# Patient Record
Sex: Male | Born: 1950
Health system: Southern US, Community
[De-identification: ages and names within clinical notes are randomized; demographics above are authoritative.]

## PROBLEM LIST (undated history)

## (undated) DIAGNOSIS — E559 Vitamin D deficiency, unspecified: Secondary | ICD-10-CM

## (undated) DIAGNOSIS — K811 Chronic cholecystitis: Secondary | ICD-10-CM

## (undated) DIAGNOSIS — I509 Heart failure, unspecified: Secondary | ICD-10-CM

## (undated) DIAGNOSIS — I10 Essential (primary) hypertension: Secondary | ICD-10-CM

## (undated) DIAGNOSIS — E119 Type 2 diabetes mellitus without complications: Secondary | ICD-10-CM

## (undated) DIAGNOSIS — N189 Chronic kidney disease, unspecified: Secondary | ICD-10-CM

## (undated) DIAGNOSIS — M069 Rheumatoid arthritis, unspecified: Secondary | ICD-10-CM

## (undated) HISTORY — PX: HERNIA REPAIR: SHX51

## (undated) HISTORY — DX: Heart failure, unspecified: I50.9

## (undated) HISTORY — PX: COLONOSCOPY: SHX174

## (undated) HISTORY — PX: OTHER SURGICAL HISTORY: SHX169

---

## 1996-09-02 DIAGNOSIS — M069 Rheumatoid arthritis, unspecified: Secondary | ICD-10-CM | POA: Insufficient documentation

## 2008-09-02 DIAGNOSIS — I10 Essential (primary) hypertension: Secondary | ICD-10-CM | POA: Insufficient documentation

## 2011-05-15 ENCOUNTER — Other Ambulatory Visit (HOSPITAL_COMMUNITY): Payer: Self-pay | Admitting: Family Medicine

## 2011-05-15 DIAGNOSIS — R1011 Right upper quadrant pain: Secondary | ICD-10-CM

## 2011-05-17 ENCOUNTER — Ambulatory Visit (HOSPITAL_COMMUNITY)
Admission: RE | Admit: 2011-05-17 | Discharge: 2011-05-17 | Disposition: A | Discharge status: Home / Self Care | Payer: 59 | Source: Ambulatory Visit | Attending: Family Medicine | Admitting: Family Medicine

## 2011-05-17 DIAGNOSIS — E119 Type 2 diabetes mellitus without complications: Secondary | ICD-10-CM | POA: Insufficient documentation

## 2011-05-17 DIAGNOSIS — R1011 Right upper quadrant pain: Secondary | ICD-10-CM | POA: Insufficient documentation

## 2011-05-17 DIAGNOSIS — R9389 Abnormal findings on diagnostic imaging of other specified body structures: Secondary | ICD-10-CM | POA: Insufficient documentation

## 2011-05-17 DIAGNOSIS — N281 Cyst of kidney, acquired: Secondary | ICD-10-CM | POA: Insufficient documentation

## 2011-05-17 DIAGNOSIS — I10 Essential (primary) hypertension: Secondary | ICD-10-CM | POA: Insufficient documentation

## 2011-05-20 ENCOUNTER — Other Ambulatory Visit (HOSPITAL_COMMUNITY): Payer: Self-pay | Admitting: Family Medicine

## 2011-05-20 DIAGNOSIS — IMO0002 Reserved for concepts with insufficient information to code with codable children: Secondary | ICD-10-CM

## 2011-05-21 ENCOUNTER — Ambulatory Visit (HOSPITAL_COMMUNITY)
Admission: RE | Admit: 2011-05-21 | Discharge: 2011-05-21 | Disposition: A | Discharge status: Home / Self Care | Payer: 59 | Source: Ambulatory Visit | Attending: Family Medicine | Admitting: Family Medicine

## 2011-05-21 DIAGNOSIS — Q619 Cystic kidney disease, unspecified: Secondary | ICD-10-CM | POA: Insufficient documentation

## 2011-05-21 DIAGNOSIS — IMO0002 Reserved for concepts with insufficient information to code with codable children: Secondary | ICD-10-CM

## 2011-05-21 DIAGNOSIS — N200 Calculus of kidney: Secondary | ICD-10-CM | POA: Insufficient documentation

## 2011-05-21 DIAGNOSIS — K8020 Calculus of gallbladder without cholecystitis without obstruction: Secondary | ICD-10-CM | POA: Insufficient documentation

## 2011-05-21 DIAGNOSIS — K5730 Diverticulosis of large intestine without perforation or abscess without bleeding: Secondary | ICD-10-CM | POA: Insufficient documentation

## 2011-05-21 DIAGNOSIS — R9389 Abnormal findings on diagnostic imaging of other specified body structures: Secondary | ICD-10-CM | POA: Insufficient documentation

## 2011-05-21 MED ORDER — IOHEXOL 350 MG/ML SOLN
100.0000 mL | Freq: Once | INTRAVENOUS | Status: AC | PRN
  Administered 2011-05-21: 100 mL via INTRAVENOUS

## 2011-11-20 ENCOUNTER — Other Ambulatory Visit (HOSPITAL_COMMUNITY): Payer: Self-pay | Admitting: *Deleted

## 2011-11-25 ENCOUNTER — Encounter (HOSPITAL_COMMUNITY)
Admission: RE | Admit: 2011-11-25 | Discharge: 2011-11-25 | Disposition: A | Discharge status: Home / Self Care | Payer: 59 | Source: Ambulatory Visit | Attending: Internal Medicine | Admitting: Internal Medicine

## 2011-11-25 DIAGNOSIS — M069 Rheumatoid arthritis, unspecified: Secondary | ICD-10-CM | POA: Insufficient documentation

## 2011-11-25 MED ORDER — SODIUM CHLORIDE 0.9 % IV SOLN
1000.0000 mg | INTRAVENOUS | Status: DC
  Administered 2011-11-25: 1000 mg via INTRAVENOUS
  Filled 2011-11-25: qty 40

## 2011-11-25 MED ORDER — SODIUM CHLORIDE 0.9 % IV SOLN
INTRAVENOUS | Status: DC
  Administered 2011-11-25: 12:00:00 via INTRAVENOUS

## 2011-12-23 ENCOUNTER — Inpatient Hospital Stay (HOSPITAL_COMMUNITY): Admission: RE | Admit: 2011-12-23 | Payer: 59 | Source: Ambulatory Visit

## 2012-01-16 ENCOUNTER — Encounter (HOSPITAL_COMMUNITY)
Admission: RE | Admit: 2012-01-16 | Discharge: 2012-01-16 | Disposition: A | Discharge status: Home / Self Care | Payer: 59 | Source: Ambulatory Visit | Attending: Internal Medicine | Admitting: Internal Medicine

## 2012-01-16 DIAGNOSIS — M069 Rheumatoid arthritis, unspecified: Secondary | ICD-10-CM | POA: Insufficient documentation

## 2012-01-16 MED ORDER — SODIUM CHLORIDE 0.9 % IV SOLN
INTRAVENOUS | Status: DC
  Administered 2012-01-16: 11:00:00 via INTRAVENOUS

## 2012-01-16 MED ORDER — ABATACEPT 250 MG IV SOLR
1000.0000 mg | INTRAVENOUS | Status: DC
  Administered 2012-01-16: 1000 mg via INTRAVENOUS
  Filled 2012-01-16: qty 40

## 2012-02-07 ENCOUNTER — Ambulatory Visit (INDEPENDENT_AMBULATORY_CARE_PROVIDER_SITE_OTHER): Payer: 59 | Admitting: Pharmacist

## 2012-02-07 ENCOUNTER — Encounter: Payer: Self-pay | Admitting: Pharmacist

## 2012-02-07 VITALS — BP 123/81 | HR 85 | Ht 73.5 in | Wt 212.9 lb

## 2012-02-07 DIAGNOSIS — E119 Type 2 diabetes mellitus without complications: Secondary | ICD-10-CM

## 2012-02-07 DIAGNOSIS — I10 Essential (primary) hypertension: Secondary | ICD-10-CM

## 2012-02-07 DIAGNOSIS — M069 Rheumatoid arthritis, unspecified: Secondary | ICD-10-CM

## 2012-02-07 NOTE — Progress Notes (Signed)
  Subjective:    Patient ID: Gary Lang, male    DOB: 1951/07/17, 61 y.o.   MRN: [id number]  HPI Patient arrives in good spirits for medication review.  Reports seeing Dr. Armanda Heritage as primary care provider at [facility] and  Dr. Jolee Ewing as his rheumatologist. Reports being diagnosed with Rheumatoid Arthritis for 15 years and states he is under acceptable level of control with Orencia for 7 years.     Review of Systems     Objective:   Physical Exam        Assessment & Plan:  Following medication review, no suggestions for change.  Complete medication list provided to patient.  Total time in face to face medication review: 20 minutes.  Patient seen with: Francesca Jewett, PharmD, Pharmacy Resident.

## 2012-02-07 NOTE — Progress Notes (Signed)
Patient ID: Gary Lang, male   DOB: Jul 01, 1951, 61 y.o.   MRN: [id number] Reviewed and agree with Dr. Graylin Shiver management.

## 2012-02-07 NOTE — Assessment & Plan Note (Signed)
Following medication review, no suggestions for change.  Complete medication list provided to patient.  Total time in face to face medication review: 20 minutes.  Patient seen with: Francesca Jewett, PharmD, Pharmacy Resident.

## 2012-02-12 ENCOUNTER — Encounter (HOSPITAL_COMMUNITY): Payer: 59

## 2013-06-10 ENCOUNTER — Ambulatory Visit (HOSPITAL_COMMUNITY)
Admission: RE | Admit: 2013-06-10 | Discharge: 2013-06-10 | Disposition: A | Discharge status: Home / Self Care | Payer: 59 | Source: Ambulatory Visit | Attending: Family Medicine | Admitting: Family Medicine

## 2013-06-10 ENCOUNTER — Other Ambulatory Visit (HOSPITAL_COMMUNITY): Payer: Self-pay | Admitting: Family Medicine

## 2013-06-10 DIAGNOSIS — N19 Unspecified kidney failure: Secondary | ICD-10-CM | POA: Insufficient documentation

## 2013-06-10 DIAGNOSIS — N281 Cyst of kidney, acquired: Secondary | ICD-10-CM | POA: Insufficient documentation

## 2013-06-18 ENCOUNTER — Encounter (HOSPITAL_COMMUNITY): Payer: Self-pay | Admitting: Emergency Medicine

## 2013-06-18 ENCOUNTER — Inpatient Hospital Stay (HOSPITAL_COMMUNITY)
Admission: EM | Admit: 2013-06-18 | Discharge: 2013-07-01 | DRG: 674 | Disposition: A | Discharge status: Home / Self Care | Payer: 59 | Attending: Family Medicine | Admitting: Family Medicine

## 2013-06-18 DIAGNOSIS — E876 Hypokalemia: Secondary | ICD-10-CM | POA: Diagnosis not present

## 2013-06-18 DIAGNOSIS — N170 Acute kidney failure with tubular necrosis: Secondary | ICD-10-CM | POA: Diagnosis present

## 2013-06-18 DIAGNOSIS — E785 Hyperlipidemia, unspecified: Secondary | ICD-10-CM

## 2013-06-18 DIAGNOSIS — N051 Unspecified nephritic syndrome with focal and segmental glomerular lesions: Secondary | ICD-10-CM

## 2013-06-18 DIAGNOSIS — N042 Nephrotic syndrome with diffuse membranous glomerulonephritis, unspecified: Secondary | ICD-10-CM | POA: Diagnosis present

## 2013-06-18 DIAGNOSIS — Z87891 Personal history of nicotine dependence: Secondary | ICD-10-CM

## 2013-06-18 DIAGNOSIS — Z7982 Long term (current) use of aspirin: Secondary | ICD-10-CM

## 2013-06-18 DIAGNOSIS — E871 Hypo-osmolality and hyponatremia: Secondary | ICD-10-CM | POA: Diagnosis not present

## 2013-06-18 DIAGNOSIS — N186 End stage renal disease: Secondary | ICD-10-CM | POA: Diagnosis present

## 2013-06-18 DIAGNOSIS — N049 Nephrotic syndrome with unspecified morphologic changes: Secondary | ICD-10-CM

## 2013-06-18 DIAGNOSIS — E872 Acidosis, unspecified: Secondary | ICD-10-CM | POA: Diagnosis not present

## 2013-06-18 DIAGNOSIS — N19 Unspecified kidney failure: Secondary | ICD-10-CM

## 2013-06-18 DIAGNOSIS — Z23 Encounter for immunization: Secondary | ICD-10-CM

## 2013-06-18 DIAGNOSIS — E119 Type 2 diabetes mellitus without complications: Secondary | ICD-10-CM

## 2013-06-18 DIAGNOSIS — E8809 Other disorders of plasma-protein metabolism, not elsewhere classified: Secondary | ICD-10-CM | POA: Diagnosis present

## 2013-06-18 DIAGNOSIS — M069 Rheumatoid arthritis, unspecified: Secondary | ICD-10-CM

## 2013-06-18 DIAGNOSIS — E8779 Other fluid overload: Secondary | ICD-10-CM | POA: Diagnosis present

## 2013-06-18 DIAGNOSIS — I120 Hypertensive chronic kidney disease with stage 5 chronic kidney disease or end stage renal disease: Secondary | ICD-10-CM | POA: Diagnosis present

## 2013-06-18 DIAGNOSIS — I10 Essential (primary) hypertension: Secondary | ICD-10-CM

## 2013-06-18 DIAGNOSIS — T502X5A Adverse effect of carbonic-anhydrase inhibitors, benzothiadiazides and other diuretics, initial encounter: Secondary | ICD-10-CM | POA: Diagnosis present

## 2013-06-18 DIAGNOSIS — N179 Acute kidney failure, unspecified: Principal | ICD-10-CM | POA: Diagnosis present

## 2013-06-18 DIAGNOSIS — D638 Anemia in other chronic diseases classified elsewhere: Secondary | ICD-10-CM | POA: Diagnosis present

## 2013-06-18 HISTORY — DX: Type 2 diabetes mellitus without complications: E11.9

## 2013-06-18 HISTORY — DX: Rheumatoid arthritis, unspecified: M06.9

## 2013-06-18 HISTORY — DX: Essential (primary) hypertension: I10

## 2013-06-18 LAB — CBC WITH DIFFERENTIAL/PLATELET
Basophils Absolute: 0 10*3/uL (ref 0.0–0.1)
Basophils Relative: 0 % (ref 0–1)
Eosinophils Absolute: 0.2 10*3/uL (ref 0.0–0.7)
Eosinophils Relative: 3 % (ref 0–5)
HCT: 31.5 % — ABNORMAL LOW (ref 39.0–52.0)
Hemoglobin: 11.2 g/dL — ABNORMAL LOW (ref 13.0–17.0)
Lymphocytes Relative: 25 % (ref 12–46)
Lymphs Abs: 1.6 10*3/uL (ref 0.7–4.0)
MCH: 28.4 pg (ref 26.0–34.0)
MCHC: 35.6 g/dL (ref 30.0–36.0)
MCV: 79.9 fL (ref 78.0–100.0)
Monocytes Absolute: 0.4 10*3/uL (ref 0.1–1.0)
Monocytes Relative: 6 % (ref 3–12)
Neutro Abs: 4.1 10*3/uL (ref 1.7–7.7)
Neutrophils Relative %: 65 % (ref 43–77)
Platelets: 270 10*3/uL (ref 150–400)
RBC: 3.94 MIL/uL — ABNORMAL LOW (ref 4.22–5.81)
RDW: 12.7 % (ref 11.5–15.5)
WBC: 6.3 10*3/uL (ref 4.0–10.5)

## 2013-06-18 LAB — CBC
HCT: 31.5 % — ABNORMAL LOW (ref 39.0–52.0)
Hemoglobin: 11.2 g/dL — ABNORMAL LOW (ref 13.0–17.0)
MCH: 28.3 pg (ref 26.0–34.0)
MCHC: 35.6 g/dL (ref 30.0–36.0)
MCV: 79.5 fL (ref 78.0–100.0)
Platelets: 270 10*3/uL (ref 150–400)
RBC: 3.96 MIL/uL — ABNORMAL LOW (ref 4.22–5.81)
RDW: 12.7 % (ref 11.5–15.5)
WBC: 6.8 10*3/uL (ref 4.0–10.5)

## 2013-06-18 LAB — URINE MICROSCOPIC-ADD ON

## 2013-06-18 LAB — COMPREHENSIVE METABOLIC PANEL
ALT: 10 U/L (ref 0–53)
AST: 12 U/L (ref 0–37)
Albumin: 1.3 g/dL — ABNORMAL LOW (ref 3.5–5.2)
Alkaline Phosphatase: 79 U/L (ref 39–117)
BUN: 50 mg/dL — ABNORMAL HIGH (ref 6–23)
CO2: 21 mEq/L (ref 19–32)
Calcium: 7.5 mg/dL — ABNORMAL LOW (ref 8.4–10.5)
Chloride: 108 mEq/L (ref 96–112)
Creatinine, Ser: 6.49 mg/dL — ABNORMAL HIGH (ref 0.50–1.35)
GFR calc Af Amer: 9 mL/min — ABNORMAL LOW (ref >90)
GFR calc non Af Amer: 8 mL/min — ABNORMAL LOW (ref >90)
Glucose, Bld: 125 mg/dL — ABNORMAL HIGH (ref 70–99)
Potassium: 3.3 mEq/L — ABNORMAL LOW (ref 3.5–5.1)
Sodium: 140 mEq/L (ref 135–145)
Total Bilirubin: <0.1 mg/dL — ABNORMAL LOW (ref 0.3–1.2)
Total Protein: 5.5 g/dL — ABNORMAL LOW (ref 6.0–8.3)

## 2013-06-18 LAB — URINALYSIS, ROUTINE W REFLEX MICROSCOPIC
Bilirubin Urine: NEGATIVE
Glucose, UA: 250 mg/dL — AB
Ketones, ur: NEGATIVE mg/dL
Leukocytes, UA: NEGATIVE
Nitrite: NEGATIVE
Protein, ur: >300 mg/dL — AB
Specific Gravity, Urine: 1.022 (ref 1.005–1.030)
Urobilinogen, UA: 0.2 mg/dL (ref 0.0–1.0)
pH: 5.5 (ref 5.0–8.0)

## 2013-06-18 LAB — CREATININE, SERUM
Creatinine, Ser: 6.26 mg/dL — ABNORMAL HIGH (ref 0.50–1.35)
GFR calc Af Amer: 10 mL/min — ABNORMAL LOW (ref >90)
GFR calc non Af Amer: 9 mL/min — ABNORMAL LOW (ref >90)

## 2013-06-18 LAB — GLUCOSE, CAPILLARY: Glucose-Capillary: 86 mg/dL (ref 70–99)

## 2013-06-18 MED ORDER — ONDANSETRON HCL 4 MG PO TABS: 4.0000 mg | ORAL_TABLET | Freq: Four times a day (QID) | ORAL | Status: DC | PRN

## 2013-06-18 MED ORDER — INFLUENZA VAC SPLIT QUAD 0.5 ML IM SUSP
0.5000 mL | INTRAMUSCULAR | Status: AC
  Administered 2013-06-19: 0.5 mL via INTRAMUSCULAR
  Filled 2013-06-18: qty 0.5

## 2013-06-18 MED ORDER — SODIUM CHLORIDE 0.9 % IJ SOLN: 3.0000 mL | INTRAMUSCULAR | Status: DC | PRN

## 2013-06-18 MED ORDER — ONDANSETRON HCL 4 MG/2ML IJ SOLN: 4.0000 mg | Freq: Four times a day (QID) | INTRAMUSCULAR | Status: DC | PRN

## 2013-06-18 MED ORDER — HYDROCHLOROTHIAZIDE 12.5 MG PO CAPS
12.5000 mg | ORAL_CAPSULE | Freq: Every day | ORAL | Status: DC
  Administered 2013-06-19 – 2013-06-23 (×5): 12.5 mg via ORAL
  Filled 2013-06-18 (×5): qty 1

## 2013-06-18 MED ORDER — PNEUMOCOCCAL 13-VAL CONJ VACC IM SUSP
0.5000 mL | INTRAMUSCULAR | Status: DC
  Filled 2013-06-18: qty 0.5

## 2013-06-18 MED ORDER — HEPARIN SODIUM (PORCINE) 5000 UNIT/ML IJ SOLN
5000.0000 [IU] | Freq: Three times a day (TID) | INTRAMUSCULAR | Status: DC
  Administered 2013-06-18 – 2013-06-20 (×5): 5000 [IU] via SUBCUTANEOUS
  Filled 2013-06-18 (×8): qty 1

## 2013-06-18 MED ORDER — ACETAMINOPHEN 500 MG PO TABS
1000.0000 mg | ORAL_TABLET | Freq: Three times a day (TID) | ORAL | Status: DC | PRN
  Filled 2013-06-18: qty 2

## 2013-06-18 MED ORDER — INSULIN ASPART 100 UNIT/ML ~~LOC~~ SOLN
0.0000 [IU] | Freq: Three times a day (TID) | SUBCUTANEOUS | Status: DC
  Administered 2013-06-19 (×2): 2 [IU] via SUBCUTANEOUS
  Administered 2013-06-19: 1 [IU] via SUBCUTANEOUS
  Administered 2013-06-20: 5 [IU] via SUBCUTANEOUS
  Administered 2013-06-20: 7 [IU] via SUBCUTANEOUS
  Administered 2013-06-20: 3 [IU] via SUBCUTANEOUS

## 2013-06-18 MED ORDER — POTASSIUM CHLORIDE CRYS ER 20 MEQ PO TBCR
40.0000 meq | EXTENDED_RELEASE_TABLET | Freq: Once | ORAL | Status: AC
  Administered 2013-06-18: 40 meq via ORAL
  Filled 2013-06-18: qty 2

## 2013-06-18 MED ORDER — SODIUM CHLORIDE 0.9 % IJ SOLN
3.0000 mL | Freq: Two times a day (BID) | INTRAMUSCULAR | Status: DC
  Administered 2013-06-18 – 2013-06-30 (×22): 3 mL via INTRAVENOUS

## 2013-06-18 MED ORDER — SODIUM CHLORIDE 0.9 % IV SOLN: 250.0000 mL | INTRAVENOUS | Status: DC | PRN

## 2013-06-18 MED ORDER — FUROSEMIDE 10 MG/ML IJ SOLN
80.0000 mg | Freq: Three times a day (TID) | INTRAMUSCULAR | Status: DC
  Administered 2013-06-18 – 2013-06-24 (×17): 80 mg via INTRAVENOUS
  Filled 2013-06-18 (×20): qty 8

## 2013-06-18 NOTE — ED Notes (Signed)
Pt reports that he has noticed swelling in his left arm and bilateral legs for the past couple of days. Reports that he was recently placed on lasix but feels like the medication is not helping. Denies any previous problems with kidneys or cardiac hx. Denies any pain, skin warm and dry. Reports he was told to come over by a kidney MD.

## 2013-06-18 NOTE — Progress Notes (Signed)
Unit CM UR Completed by [facility] ED CM  W. Oni Dietzman RN  

## 2013-06-18 NOTE — ED Notes (Signed)
Pt admitted to 6700 room 24, report given to [place], pt transported by [place] EMT.

## 2013-06-18 NOTE — H&P (Signed)
[facility] Admission History and Physical Service Pager: [phone]  Patient name: Gary Lang Medical record number: [id number] Date of birth: 1951/02/06 Age: 62 y.o. Gender: male  Primary Care Provider: Tawni Carnes, PA-C Consultants: Renal Code Status: Full  Chief Complaint: arm and leg swelling  Assessment and Plan: Zaccari Canuto is a 62 y.o. male presenting with UE and LE swelling and AKI. PMH is significant for rheumatoid arthritis, HTN, DM, and distant tobacco use   # Renal: AKI w/ Cr of 6.49 w/ no known h/o renal impairment. Highly suspiscious of nephrotic syndrome given swelling that is not responsive to diuresis, low albumin, and gross protein in urine. Unclear on cause of current syndrome, but may be related to abatacept as well as chronic changes from HTN and DM. Spoke to Dr. Mercy Moore concerning case and appreciate his input. Renal cyst noted on [place] is unchanged from previous scan 2 years ago. - Admit to med surge - Consult Renal. - 24 hr urine protein - lipid panel in am - Lasix 80 TID IV - SPEP, UPEP - Hold Abatacept, Metformin and lisinopril - Strict I/O  # Rheumatoid arthritis: no recent flares and excellent control on abatacept. Some concern that this may be conributing to current condition - hold abatacept 125mg  IM Qwkly  # DM: h/o DM that is controlled w/ metformin alone. No injectible per pt.  - Hold home metformin due to potential metabolic acidosis given current renal state - CBG monitoring - A1c - SSI  # CV: h/o HTN and hypertensive on admission. Holding lisinopril due to renal impairment. Has not taken his medications for several days per his primary care physician's orders - Cont home HCTZ and hold lisinopril  FEN/GI: Tolerating PO. HypoK likely from diuresis - Renal diet - Kdur 31mEq x1  Prophylaxis:  - Hep [place] TID  Disposition: pending improvement  History of Present Illness: Gary Lang is a 62 y.o. male  presenting with worsening peripheral edema over the past 2-3 wks ago. Pt reports dry wt of 219lbs. Started on lasix 9 days ago by PCP w/o benefit. Loss of appetite and pain in stomach last week which has resolved. Denies fevers, n/v/d/c, CP, SOB, palpitations, dysuria, hematuria. No change on medications recently. Significant urine production on lasix. W/o BP medications for 2 wks.  Review Of Systems: Per HPI with the following additions: none Otherwise 12 point review of systems was performed and was unremarkable.  Patient Active Problem List   Diagnosis Date Noted  . Diabetes mellitus 02/07/2012  . Hypertension 09/02/2008  . Rheumatoid arthritis 09/02/1996   Past Medical History: Past Medical History  Diagnosis Date  . Hypertension   . Diabetes mellitus without complication   . Rheumatoid arthritis    Past Surgical History: Past Surgical History  Procedure Laterality Date  . Hernia repair    . Neck surgery     Social History: History  Substance Use Topics  . Smoking status: Former Smoker -- 0.50 packs/day for 10 years    Types: Cigarettes    Quit date: 09/02/1976  . Smokeless tobacco: Never Used  . Alcohol Use: No   Additional social history: none  Please also refer to relevant sections of EMR.  Family History: Family History  Problem Relation Age of Onset  . Cancer Mother    Allergies and Medications: Allergies  Allergen Reactions  . Glyburide   . Methotrexate Derivatives Other (See Comments)    Patient cannot remember   No current facility-administered medications on file  prior to encounter.   Current Outpatient Prescriptions on File Prior to Encounter  Medication Sig Dispense Refill  . acetaminophen (TYLENOL) 500 MG tablet Take 2 tablets (1,000 mg total) by mouth 3 (three) times daily as needed for pain.      Marland Kitchen aspirin 81 MG EC tablet Take 1 tablet (81 mg total) by mouth daily. Swallow whole.      . metFORMIN (GLUCOPHAGE) 500 MG tablet Take 2 tablets (1,000  mg total) by mouth 2 (two) times daily with a meal.        Objective: BP 183/88  Pulse 64  Temp(Src) 97.9 F (36.6 C) (Oral)  Resp 18  Wt 250 lb (113.399 kg)  BMI 32.53 kg/m2  SpO2 100% Exam: General: NAD, WNWD HEENT: mmm, EOMI Cardiovascular: II/VI systolic murmur, RRR Respiratory: CTAB, nml effort. On RA Abdomen: NABS, mildly distended, non-tender to palpation Extremities: diffuse 2-3+ pitting edema of UE and LE, 2+ pulses Skin: warm and intact Neuro: CN 2-12 grossly intact. Moves all extremities spontaneously   Labs and Imaging: Results for orders placed during the hospital encounter of 06/18/13 (from the past 24 hour(s))  CBC WITH DIFFERENTIAL     Status: Abnormal   Collection Time    06/18/13 12:40 PM      Result Value Range   WBC 6.3  4.0 - 10.5 K/uL   RBC 3.94 (*) 4.22 - 5.81 MIL/uL   Hemoglobin 11.2 (*) 13.0 - 17.0 g/dL   HCT 31.5 (*) 39.0 - 52.0 %   MCV 79.9  78.0 - 100.0 fL   MCH 28.4  26.0 - 34.0 pg   MCHC 35.6  30.0 - 36.0 g/dL   RDW 12.7  11.5 - 15.5 %   Platelets 270  150 - 400 K/uL   Neutrophils Relative % 65  43 - 77 %   Neutro Abs 4.1  1.7 - 7.7 K/uL   Lymphocytes Relative 25  12 - 46 %   Lymphs Abs 1.6  0.7 - 4.0 K/uL   Monocytes Relative 6  3 - 12 %   Monocytes Absolute 0.4  0.1 - 1.0 K/uL   Eosinophils Relative 3  0 - 5 %   Eosinophils Absolute 0.2  0.0 - 0.7 K/uL   Basophils Relative 0  0 - 1 %   Basophils Absolute 0.0  0.0 - 0.1 K/uL  COMPREHENSIVE METABOLIC PANEL     Status: Abnormal   Collection Time    06/18/13 12:40 PM      Result Value Range   Sodium 140  135 - 145 mEq/L   Potassium 3.3 (*) 3.5 - 5.1 mEq/L   Chloride 108  96 - 112 mEq/L   CO2 21  19 - 32 mEq/L   Glucose, Bld 125 (*) 70 - 99 mg/dL   BUN 50 (*) 6 - 23 mg/dL   Creatinine, Ser 6.49 (*) 0.50 - 1.35 mg/dL   Calcium 7.5 (*) 8.4 - 10.5 mg/dL   Total Protein 5.5 (*) 6.0 - 8.3 g/dL   Albumin 1.3 (*) 3.5 - 5.2 g/dL   AST 12  0 - 37 U/L   ALT 10  0 - 53 U/L   Alkaline  Phosphatase 79  39 - 117 U/L   Total Bilirubin <0.1 (*) 0.3 - 1.2 mg/dL   GFR calc non Af Amer 8 (*) >90 mL/min   GFR calc Af Amer 9 (*) >90 mL/min  URINALYSIS, ROUTINE W REFLEX MICROSCOPIC     Status: Abnormal  Collection Time    06/18/13  1:11 PM      Result Value Range   Color, Urine YELLOW  YELLOW   APPearance CLOUDY (*) CLEAR   Specific Gravity, Urine 1.022  1.005 - 1.030   pH 5.5  5.0 - 8.0   Glucose, UA 250 (*) NEGATIVE mg/dL   Hgb urine dipstick MODERATE (*) NEGATIVE   Bilirubin Urine NEGATIVE  NEGATIVE   Ketones, ur NEGATIVE  NEGATIVE mg/dL   Protein, ur >300 (*) NEGATIVE mg/dL   Urobilinogen, UA 0.2  0.0 - 1.0 mg/dL   Nitrite NEGATIVE  NEGATIVE   Leukocytes, UA NEGATIVE  NEGATIVE  URINE MICROSCOPIC-ADD ON     Status: Abnormal   Collection Time    06/18/13  1:11 PM      Result Value Range   Squamous Epithelial / LPF FEW (*) RARE   WBC, UA 3-6  <3 WBC/hpf   RBC / HPF 0-2  <3 RBC/hpf   Bacteria, UA FEW (*) RARE   Casts GRANULAR CAST (*) NEGATIVE   US Renal  06/10/2013   CLINICAL DATA:  Renal failure  EXAM: RENAL/URINARY TRACT ULTRASOUND COMPLETE  COMPARISON:  Abdomen ultrasound 05/17/2011  FINDINGS: Right Kidney  Length: 11.9 cm length. Increased cortical echogenicity. Normal cortical thickness. No mass, hydronephrosis, or shadowing calcification.  Left Kidney  Length: 12.6 cm length. Increased cortical echogenicity. Normal cortical thickness. Cyst at mid left kidney 3.6 x 3.6 x 3.4 cm. Vague slightly hypoechoic area at mid inferior pole left kidney, potentially artifact noted difficult to completely exclude a subtle mass, area in question 1.8 cm greatest diameter. No shadowing calcifications or hydronephrosis.  Bladder:  Unremarkable.  Right pleural effusion noted.  IMPRESSION: Medical renal disease changes in both kidneys.  3.6 cm diameter left renal cyst.  Vague hypoechoic area within left kidney, potentially artifact but difficult completely exclude mass.  Consider non  contrast MR imaging (non contrast due to renal failure) to exclude solid left renal mass lesion.   Electronically Signed   By: Lavonia Dana M.D.   On: 06/10/2013 13:10   Waldemar Dickens, MD 06/18/2013, 6:08 PM PGY-3, [place] Intern pager: [phone], text pages welcome

## 2013-06-18 NOTE — ED Provider Notes (Signed)
CSN: [id number]     Arrival date & time 06/18/13  1146 History   First MD Initiated Contact with Patient 06/18/13 1215     Chief Complaint  Patient presents with  . Leg Swelling   (Consider location/radiation/quality/duration/timing/severity/associated sxs/prior Treatment) HPI  62 year old male with increasing peripheral edema. Onset 2-3 weeks ago and progressively worsening. Patient reports a one month ago he weighed 219 pounds. Today he weighs 250. He said he went to his primary care doctor about a week ago. He says they told him he had a lot of protein in his urine. He is not sure about any specific blood work. He states that he was instructed to stop taking all his medications aside from his orencia he was started on Lasix. Symptoms have not changed since then. He is urinating. No fevers or chills. No shortness of breath. Is complaining of some right elbow pain which he attributes to rheumatoid arthritis.  Past Medical History  Diagnosis Date  . Hypertension   . Diabetes mellitus without complication    Past Surgical History  Procedure Laterality Date  . Hernia repair    . Neck surgery     History reviewed. No pertinent family history. History  Substance Use Topics  . Smoking status: Former Smoker -- 0.50 packs/day for 10 years    Types: Cigarettes    Quit date: 09/02/1976  . Smokeless tobacco: Never Used  . Alcohol Use: No    Review of Systems  All systems reviewed and negative, other than as noted in HPI.   Allergies  Glyburide and Methotrexate derivatives  Home Medications   Current Outpatient Rx  Name  Route  Sig  Dispense  Refill  . Abatacept 125 MG/ML SOLN   Subcutaneous   Inject 1 mL into the skin once a week. Every monday         . acetaminophen (TYLENOL) 500 MG tablet   Oral   Take 2 tablets (1,000 mg total) by mouth 3 (three) times daily as needed for pain.         Marland Kitchen aspirin 81 MG EC tablet   Oral   Take 1 tablet (81 mg total) by mouth daily.  Swallow whole.         . furosemide (LASIX) 20 MG tablet   Oral   Take 20 mg by mouth daily.         Marland Kitchen lisinopril-hydrochlorothiazide (PRINZIDE,ZESTORETIC) 10-12.5 MG per tablet   Oral   Take 1 tablet by mouth daily.         . metFORMIN (GLUCOPHAGE) 500 MG tablet   Oral   Take 2 tablets (1,000 mg total) by mouth 2 (two) times daily with a meal.         . Multiple Vitamins-Minerals (MULTIVITAMIN PO)   Oral   Take 1 tablet by mouth daily.          BP 178/83  Pulse 63  Temp(Src) 97.9 F (36.6 C) (Oral)  Resp 19  Wt 250 lb (113.399 kg)  BMI 32.53 kg/m2  SpO2 99% Physical Exam  Nursing note and vitals reviewed. Constitutional: He appears well-developed and well-nourished. No distress.  HENT:  Head: Normocephalic and atraumatic.  Eyes: Conjunctivae are normal. Right eye exhibits no discharge. Left eye exhibits no discharge.  Neck: Neck supple.  Cardiovascular: Normal rate, regular rhythm and normal heart sounds.  Exam reveals no gallop and no friction rub.   No murmur heard. Pulmonary/Chest: Effort normal and breath sounds normal. No respiratory distress.  Abdominal: Soft. He exhibits no distension. There is no tenderness.  Musculoskeletal: He exhibits no edema and no tenderness.  Symmetric lower extremity edema. Swelling of the right upper extremity, low-cholesterol the right elbow. Neurovascular intact distally. No increased warmth. No skin changes noted.  Neurological: He is alert.  Skin: Skin is warm and dry.  Psychiatric: He has a normal mood and affect. His behavior is normal. Thought content normal.    ED Course  Procedures (including critical care time) Labs Review Labs Reviewed  CBC WITH DIFFERENTIAL - Abnormal; Notable for the following:    RBC 3.94 (*)    Hemoglobin 11.2 (*)    HCT 31.5 (*)    All other components within normal limits  COMPREHENSIVE METABOLIC PANEL - Abnormal; Notable for the following:    Potassium 3.3 (*)    Glucose, Bld 125  (*)    BUN 50 (*)    Creatinine, Ser 6.49 (*)    Calcium 7.5 (*)    Total Protein 5.5 (*)    Albumin 1.3 (*)    Total Bilirubin <0.1 (*)    GFR calc non Af Amer 8 (*)    GFR calc Af Amer 9 (*)    All other components within normal limits  URINALYSIS, ROUTINE W REFLEX MICROSCOPIC   Imaging Review No results found.  EKG Interpretation   None       MDM   1. Renal failure     62 year old male with presumably new renal failure. No labs for comparison. Lytes ok. Clinically volume overloaded. Denies SOB.  Reports urine out put has remained steady..Admit.   Virgel Manifold, MD 06/18/13 [phone]

## 2013-06-18 NOTE — ED Notes (Signed)
Pt presents to ED with concerns of ongoing swelling to his Right arm, bilateral lower extremities, and groin area x2 weeks with increase swelling x2 days, pt started on Lasix with no relief. Pt's spouse states "his abdomen is swollen." pt denies any problems with urination but does report increase urination since starting Lasix. Pt denies chest pain, SOB, N/V/D, diaphoresis, cough, congestion, or fever.

## 2013-06-18 NOTE — ED Notes (Signed)
Offered blanket but declined at this time.

## 2013-06-18 NOTE — Progress Notes (Signed)
Pt admitted to [facility] unit [place] from [facility] ED. Received report from [place], [place]. Pt arrived to unit after shift change; report passed on to night shift nurse.  Henry Russel, RN

## 2013-06-19 DIAGNOSIS — I10 Essential (primary) hypertension: Secondary | ICD-10-CM

## 2013-06-19 DIAGNOSIS — E119 Type 2 diabetes mellitus without complications: Secondary | ICD-10-CM

## 2013-06-19 DIAGNOSIS — N19 Unspecified kidney failure: Secondary | ICD-10-CM

## 2013-06-19 DIAGNOSIS — M069 Rheumatoid arthritis, unspecified: Secondary | ICD-10-CM

## 2013-06-19 LAB — GLUCOSE, CAPILLARY
Glucose-Capillary: 183 mg/dL — ABNORMAL HIGH (ref 70–99)
Glucose-Capillary: 155 mg/dL — ABNORMAL HIGH (ref 70–99)

## 2013-06-19 LAB — LIPID PANEL
Cholesterol: 325 mg/dL — ABNORMAL HIGH (ref 0–200)
HDL: 46 mg/dL (ref >39)
LDL Cholesterol: 250 mg/dL — ABNORMAL HIGH (ref 0–99)
Total CHOL/HDL Ratio: 7.1 RATIO
Triglycerides: 145 mg/dL (ref <150)
VLDL: 29 mg/dL (ref 0–40)

## 2013-06-19 LAB — COMPREHENSIVE METABOLIC PANEL
ALT: 11 U/L (ref 0–53)
AST: 12 U/L (ref 0–37)
Albumin: 1.1 g/dL — ABNORMAL LOW (ref 3.5–5.2)
Alkaline Phosphatase: 75 U/L (ref 39–117)
BUN: 51 mg/dL — ABNORMAL HIGH (ref 6–23)
CO2: 19 mEq/L (ref 19–32)
Calcium: 7.5 mg/dL — ABNORMAL LOW (ref 8.4–10.5)
Chloride: 107 mEq/L (ref 96–112)
Creatinine, Ser: 6.63 mg/dL — ABNORMAL HIGH (ref 0.50–1.35)
GFR calc Af Amer: 9 mL/min — ABNORMAL LOW (ref >90)
GFR calc non Af Amer: 8 mL/min — ABNORMAL LOW (ref >90)
Glucose, Bld: 112 mg/dL — ABNORMAL HIGH (ref 70–99)
Potassium: 3.1 mEq/L — ABNORMAL LOW (ref 3.5–5.1)
Sodium: 138 mEq/L (ref 135–145)
Total Bilirubin: 0.1 mg/dL — ABNORMAL LOW (ref 0.3–1.2)
Total Protein: 5.2 g/dL — ABNORMAL LOW (ref 6.0–8.3)

## 2013-06-19 LAB — CBC
HCT: 30.8 % — ABNORMAL LOW (ref 39.0–52.0)
Hemoglobin: 10.9 g/dL — ABNORMAL LOW (ref 13.0–17.0)
MCH: 28 pg (ref 26.0–34.0)
MCHC: 35.4 g/dL (ref 30.0–36.0)
MCV: 79.2 fL (ref 78.0–100.0)
Platelets: 261 10*3/uL (ref 150–400)
RBC: 3.89 MIL/uL — ABNORMAL LOW (ref 4.22–5.81)
RDW: 12.8 % (ref 11.5–15.5)
WBC: 6.4 10*3/uL (ref 4.0–10.5)

## 2013-06-19 LAB — URINE CULTURE: Colony Count: 50000

## 2013-06-19 LAB — HEMOGLOBIN A1C
Hgb A1c MFr Bld: 9.4 % — ABNORMAL HIGH (ref <5.7)
Mean Plasma Glucose: 223 mg/dL — ABNORMAL HIGH (ref <117)

## 2013-06-19 LAB — PROTEIN, URINE, 24 HOUR
Collection Interval-UPROT: 24 hours
Protein, 24H Urine: 18690 mg/d — ABNORMAL HIGH (ref 50–100)
Protein, Urine: 890 mg/dL
Urine Total Volume-UPROT: 2100 mL

## 2013-06-19 LAB — PROTEIN / CREATININE RATIO, URINE: Creatinine, Urine: 71.77 mg/dL

## 2013-06-19 LAB — HEPATITIS C ANTIBODY (REFLEX): HCV Ab: NEGATIVE

## 2013-06-19 MED ORDER — SODIUM CHLORIDE 0.9 % IV SOLN
500.0000 mg | INTRAVENOUS | Status: AC
  Administered 2013-06-19 – 2013-06-21 (×3): 500 mg via INTRAVENOUS
  Filled 2013-06-19 (×3): qty 4

## 2013-06-19 MED ORDER — POTASSIUM CHLORIDE CRYS ER 20 MEQ PO TBCR
40.0000 meq | EXTENDED_RELEASE_TABLET | Freq: Once | ORAL | Status: AC
  Administered 2013-06-19: 40 meq via ORAL
  Filled 2013-06-19: qty 2

## 2013-06-19 MED ORDER — PNEUMOCOCCAL VAC POLYVALENT 25 MCG/0.5ML IJ INJ
0.5000 mL | INJECTION | INTRAMUSCULAR | Status: AC
  Administered 2013-06-19: 0.5 mL via INTRAMUSCULAR
  Filled 2013-06-19: qty 0.5

## 2013-06-19 NOTE — H&P (Signed)
Family Medicine Teaching Service Attending Note  I interviewed and examined patient Gary Lang and reviewed their tests and x-rays.  I discussed with Dr. Marily Memos and reviewed their note for today.  I agree with their assessment and plan.     Additionally  Feeling less swollen with brisk diuresis No prior renal history.  No evident cause for ARF and proteinuria Agree with hold RA DMARD  24 hour urine and renal consult

## 2013-06-19 NOTE — Consult Note (Addendum)
Requesting Physician:  Dr. Erin Hearing Primary Care: Sharma Covert PA in the office of Dr. Quillian Quince ([facility]) Rheumatologist: Dr. Charlestine Night Reason for Consult:  Renal failure  HPI: The patient is a 62 y.o. year-old disabled AAM with a background history of Type 2 NIDDM of 8-9 years duration, Rheumatoid arthritis on Orencia (abatacept) for several years, who came to [facility] hospital for evaluation of kidney function and edema.  He tells me that in September he had his complete physical with labs and was told "everything looked good.  About 3 weeks ago developed the onset of swelling, saw his physicians again - had labs- was told "there was a kidney problem" (We do not have current access to those labs).  He was then asked to collect a 24 hour urine which he turned in this past Monday, was started on Lasix, sent for an ultrasound, then told that "he would need to see a kidney specialist".  Because his wife was anxious to get to the bottom of this she brought him to the ED.  His creatinine was 6.49, albumin 1.3, UA showed >300 mg% protein with 0-2 RBC's, 3-6 WBC's, granular casts. He is getting diuresis, creatinine is up to 6.49 today. We are asked to evaluate.  He does not use NSAIDS. No recent issues with sinus or PNA Has not noted tea or coca cola colored urine.  Did look foamy to him X 1 No acute rashes    Creatinine, Ser  Date/Time Value Range Status  06/19/2013  5:38 AM 6.63* 0.50 - 1.35 mg/dL Final  06/18/2013  8:29 PM 6.26* 0.50 - 1.35 mg/dL Final  06/18/2013 12:40 PM 6.49* 0.50 - 1.35 mg/dL Final     Past Medical History  Diagnosis Date  . Hypertension   . Diabetes mellitus without complication   . Rheumatoid arthritis     Past Surgical History:  Past Surgical History  Procedure Laterality Date  . Hernia repair    . Neck surgery      Family History:  Family History  Problem Relation Age of Onset  . Cancer Mother    Social History:  reports that he quit smoking about 36  years ago. His smoking use included Cigarettes. He has a 5 pack-year smoking history. He has never used smokeless tobacco. He reports that he does not drink alcohol or use illicit drugs.He is married, lives with his wife Onalee Hua (who is a Chartered certified accountant here at [employer]).  Has 3 children, 2 boys and a girl.  One son has a kidney transplant d/t congenital renal hypoplasia.  He previously worked in [employer].  Allergies:  Allergies  Allergen Reactions  . Glyburide   . Methotrexate Derivatives Other (See Comments)    Patient cannot remember    Home medications: Prior to Admission medications   Medication Sig Start Date End Date Taking? Authorizing Provider  Abatacept 125 MG/ML SOLN Inject 1 mL into the skin once a week. Every monday   Yes Historical Provider, MD  acetaminophen (TYLENOL) 500 MG tablet Take 2 tablets (1,000 mg total) by mouth 3 (three) times daily as needed for pain. 02/07/12  Yes Zigmund Gottron, MD  aspirin 81 MG EC tablet Take 1 tablet (81 mg total) by mouth daily. Swallow whole. 02/07/12  Yes Zigmund Gottron, MD  furosemide (LASIX) 20 MG tablet Take 20 mg by mouth daily.   Yes Historical Provider, MD  lisinopril-hydrochlorothiazide (PRINZIDE,ZESTORETIC) 10-12.5 MG per tablet Take 1 tablet by mouth daily.   Yes Historical  Provider, MD  metFORMIN (GLUCOPHAGE) 500 MG tablet Take 2 tablets (1,000 mg total) by mouth 2 (two) times daily with a meal. 02/07/12  Yes Zigmund Gottron, MD  Multiple Vitamins-Minerals (MULTIVITAMIN PO) Take 1 tablet by mouth daily.   Yes Historical Provider, MD    Inpatient medications: . furosemide  80 mg Intravenous TID  . heparin  5,000 Units Subcutaneous Q8H  . hydrochlorothiazide  12.5 mg Oral Daily  . insulin aspart  0-9 Units Subcutaneous TID WC  . potassium chloride  40 mEq Oral Once  . sodium chloride  3 mL Intravenous Q12H    Review of Systems Gen:  Denies headache, fever, chills, sweats.  + weight gain from 218-->350 over the past 3  weeks HEENT:  No visual change, sore throat, difficulty swallowing. Resp:  No difficulty breathing, DOE.  No cough or hemoptysis. No sinus infections Cardiac:  No chest pain, orthopnea, PND.  Edema abrupt onset 3 weeks ago GI:   Denies abdominal pain.   No nausea, vomiting, diarrhea.  No constipation. GU:  Denies difficulty or change in voiding.  No change in urine color.  No tea or coca cola colored urine, foaminess noted X 1   MS:  +arthralgias due to his RA   Derm:  "chronic recurrent heat bumps" on elbows for years No acute skin rashes.  Neuro:   Denies focal weakness, memory problems, hx stroke or TIA.   Psych:  Denies symptoms of depression of anxiety.  No hallucination.    Physical Exam:  Blood pressure 175/75, pulse 68, temperature 98.2 F (36.8 C), temperature source Oral, resp. rate 18, height 6' 1.5" (1.867 m), weight 111.273 kg (245 lb 5 oz), SpO2 98.00%.  Gen: Tall WDWN AAM  NAD Skin: no rash, cyanosis Neck: no JVD, no bruits or LAN Chest: Clear to auscultation Heart: regular, no rub or gallop Abdomen: soft, no focal tenderness Ext: 4+ pre-tibial pitting edema to the knees, 1+ post thighs Neuro: alert, Ox3, no focal deficit Heme/Lymph: no bruising or LAN    Recent Labs Lab 06/18/13 1240 06/18/13 2029 06/19/13 0538  NA 140  --  138  K 3.3*  --  3.1*  CL 108  --  107  CO2 21  --  19  GLUCOSE 125*  --  112*  BUN 50*  --  51*  CREATININE 6.49* 6.26* 6.63*  CALCIUM 7.5*  --  7.5*    Recent Labs Lab 06/18/13 1240 06/19/13 0538  AST 12 12  ALT 10 11  ALKPHOS 79 75  BILITOT <0.1* 0.1*  PROT 5.5* 5.2*  ALBUMIN 1.3* 1.1*   Recent Labs Lab 06/18/13 1240 06/18/13 2029 06/19/13 0538  WBC 6.3 6.8 6.4  NEUTROABS 4.1  --   --   HGB 11.2* 11.2* 10.9*  HCT 31.5* 31.5* 30.8*  MCV 79.9 79.5 79.2  PLT 270 270 261    Recent Labs Lab 06/18/13 1858 06/18/13 2133  GLUCAP 86 177*  Results for KOLSON, SARAFIN (MRN [id number]) as of 06/19/2013 13:33  Ref. Range  06/19/2013 05:38  Cholesterol Latest Range: 0-200 mg/dL 325 (H)  Triglycerides Latest Range: <150 mg/dL 145  HDL Latest Range: >39 mg/dL 46  LDL (calc) Latest Range: 0-99 mg/dL 250 (H)  VLDL Latest Range: 0-40 mg/dL 29  Total CHOL/HDL Ratio No range found 7.1   Results for HAIDAN, RIN (MRN [id number]) as of 06/19/2013 13:33  Ref. Range 06/18/2013 13:11  Color, Urine Latest Range: YELLOW  YELLOW  APPearance Latest Range: CLEAR  CLOUDY (A)  Specific Gravity, Urine Latest Range: 1.005-1.030  1.022  pH Latest Range: 5.0-8.0  5.5  Glucose Latest Range: NEGATIVE mg/dL 250 (A)  Bilirubin Urine Latest Range: NEGATIVE  NEGATIVE  Ketones, ur Latest Range: NEGATIVE mg/dL NEGATIVE  Protein Latest Range: NEGATIVE mg/dL >300 (A)  Urobilinogen, UA Latest Range: 0.0-1.0 mg/dL 0.2  Nitrite Latest Range: NEGATIVE  NEGATIVE  Leukocytes, UA Latest Range: NEGATIVE  NEGATIVE  Hgb urine dipstick Latest Range: NEGATIVE  MODERATE (A)  WBC, UA Latest Range: <3 WBC/hpf 3-6  RBC / HPF Latest Range: <3 RBC/hpf 0-2  Squamous Epithelial / LPF Latest Range: RARE  FEW (A)  Bacteria, UA Latest Range: RARE  FEW (A)  Casts Latest Range: NEGATIVE  GRANULAR CAST (A)    Xrays/Other Studies: No results found. CLINICAL DATA: Renal failure  EXAM:  RENAL/URINARY TRACT ULTRASOUND COMPLETE  COMPARISON: Abdomen ultrasound 05/17/2011  FINDINGS:  Right Kidney  Length: 11.9 cm length. Increased cortical echogenicity. Normal  cortical thickness. No mass, hydronephrosis, or shadowing  calcification.  Left Kidney  Length: 12.6 cm length. Increased cortical echogenicity. Normal  cortical thickness. Cyst at mid left kidney 3.6 x 3.6 x 3.4 cm.  Vague slightly hypoechoic area at mid inferior pole left kidney,  potentially artifact noted difficult to completely exclude a subtle  mass, area in question 1.8 cm greatest diameter. No shadowing  calcifications or hydronephrosis.  Bladder: Unremarkable.  Right pleural effusion  noted.  IMPRESSION:  Medical renal disease changes in both kidneys.  3.6 cm diameter left renal cyst.  Vague hypoechoic area within left kidney, potentially artifact but  difficult completely exclude mass.  Consider non contrast MR imaging (non contrast due to renal failure)  to exclude solid left renal mass lesion.  Electronically Signed  By: Lavonia Dana M.D.  On: 06/10/2013 13:10  Impression/Plan 62 yo AAM with abrupt onset of edema, 32 pound weight gain over the past 3 weeks, with renal failure believed to be new (pt reports labs done at his primary care all normal September, then was told after onset of edema and repeat labs - to which we have no weekend access - that he had a "kidney problem", was asked to complete a 24 hour urine and get ultrasound)  Now presents with advanced renal failure and probable nephrotic syndrome - suspect some sort of acute GN  1. Acute renal failure/probable nephrotic syndrome  - presume to have occurred over the last 3 weeks based on patient report, with primary care that had initiated workup. No access to records today. Creatinine is rising in the hospital.  Could have some form of RPGN.  Will need renal biopsy - trying to set up for Monday.  In the interim will empirically give 3 doses of high dose Solu-Medrol which should not affect the results of the biopsy.  Multiple serologies will be ordered including ANA, complements, ANCA, SPEP, UPEP, hepatitis serologies but the bx will be the key for dx. Stop SQ heparin after today. Try to get records from the primary care office (closed) 2. HTN - agree with avoidance of ACE or ARB at this time but needs BP controlled 3. DM - metformin on hold. Note that the steroids will likely alter his DM control 4. Hyperlipidemia  - will require statin 5. RA  - agree with holding abatacept but I am not finding reports of acute GN associated with it   Thanks for the consult.  I have called and spoken with the primary  team.  Jamal Maes,  MD [facility] [phone] pager 06/19/2013, 1:36 PM

## 2013-06-19 NOTE — Progress Notes (Signed)
Family Medicine Teaching Service Daily Progress Note Intern Pager: [phone]  Patient name: Gary Lang Medical record number: [id number] Date of birth: [birth year] Age: 62 y.o. Gender: male  Primary Care Provider: Tawni Carnes, PA-C Consultants: Nephrology Code Status: Full  Pt Overview and Major Events to Date:   Assessment and Plan: Gary Lang is a 62 y.o. male presenting with UE and LE swelling and AKI. PMH is significant for rheumatoid arthritis, HTN, and T2DM.   # Renal failure, unknown chronicity. Cr: 6.49 w/ no known h/o renal impairment. Renal cyst noted on [place] is unchanged from previous scan 2 years ago. Suspect nephrotic syndrome.  - Lasix 80mg  IV TID: down 5 pounds today in setting of 30lb weight gain over past month. - KDUR 16mEq po x1 - Nephrology to see today - SPEP, UPEP, 24hr urine protein - Hold abatacept, metformin and lisinopril  - Strict I/O  - Lipid panel includes LDL 250, HDL 46, Trig 145 - Hypoalbuminemia: Alb 1.1 - Hypokalemia: 3.1 secondary to lasix, cautiously repleting with KDUR  # Rheumatoid arthritis: no recent flares and excellent control on abatacept.  - hold abatacept 125mg  IM qMondays  # T2DM: Controlled w/ metformin alone. No injectible per pt.  - Hold home metformin due to potential metabolic acidosis given current renal state  - A1c in process - CBG monitoring  - SSI   # CV: h/o HTN and hypertensive on admission. Holding lisinopril due to renal impairment. Has not taken his medications for several days per his primary care physician's orders  - Cont home HCTZ and hold lisinopril   FEN/GI: Renal diet Prophylaxis: Hep [place] TID  Disposition: Continued evaluation  Subjective: Pt reports no pain, denies dysuria, reports edema has improved very slightly.   Objective: Temp:  [97.8 F (36.6 C)-98.3 F (36.8 C)] 98.2 F (36.8 C) (10/18 0813) Pulse Rate:  [63-72] 68 (10/18 0813) Resp:  [18-19] 18 (10/18 0813) BP: (168-194)/(73-100)  175/75 mmHg (10/18 0813) SpO2:  [97 %-100 %] 98 % (10/18 0813) Weight:  [245 lb 5 oz (111.273 kg)-250 lb (113.399 kg)] 245 lb 5 oz (111.273 kg) (10/17 1853) Physical Exam: General: 62 yo male in NAD Cardiovascular: RRR, II/VI systolic murmur Respiratory: Nonlabored, CTAB on room air Abdomen: NABS, mildly distended, non-tender to palpation  Extremities: diffuse 2-3+ pitting edema of UE and LE, 2+ pulses  Laboratory:  Recent Labs Lab 06/18/13 1240 06/18/13 2029 06/19/13 0538  WBC 6.3 6.8 6.4  HGB 11.2* 11.2* 10.9*  HCT 31.5* 31.5* 30.8*  PLT 270 270 261    Recent Labs Lab 06/18/13 1240 06/18/13 2029 06/19/13 0538  NA 140  --  138  K 3.3*  --  3.1*  CL 108  --  107  CO2 21  --  19  BUN 50*  --  51*  CREATININE 6.49* 6.26* 6.63*  CALCIUM 7.5*  --  7.5*  PROT 5.5*  --  5.2*  BILITOT <0.1*  --  0.1*  ALKPHOS 79  --  75  ALT 10  --  11  AST 12  --  12  GLUCOSE 125*  --  112*    06/18/2013 13:11  Color, Urine YELLOW  APPearance CLOUDY (A)  Specific Gravity, Urine 1.022  pH 5.5  Glucose 250 (A)  Bilirubin Urine NEGATIVE  Ketones, ur NEGATIVE  Protein >300 (A)  Urobilinogen, UA 0.2  Nitrite NEGATIVE  Leukocytes, UA NEGATIVE  Hgb urine dipstick MODERATE (A)  WBC, UA 3-6  RBC / HPF 0-2  Squamous Epithelial /  LPF FEW (A)  Bacteria, UA FEW (A)  Casts GRANULAR CAST (A)   Imaging/Diagnostic Tests:  06/10/2013 CLINICAL DATA: Renal failure EXAM: RENAL/URINARY TRACT ULTRASOUND COMPLETE COMPARISON: Abdomen ultrasound 05/17/2011 FINDINGS: Right Kidney Length: 11.9 cm length. Increased cortical echogenicity. Normal cortical thickness. No mass, hydronephrosis, or shadowing calcification. Left Kidney Length: 12.6 cm length. Increased cortical echogenicity. Normal cortical thickness. Cyst at mid left kidney 3.6 x 3.6 x 3.4 cm. Vague slightly hypoechoic area at mid inferior pole left kidney, potentially artifact noted difficult to completely exclude a subtle mass, area in question  1.8 cm greatest diameter. No shadowing calcifications or hydronephrosis. Bladder: Unremarkable. Right pleural effusion noted. IMPRESSION: Medical renal disease changes in both kidneys. 3.6 cm diameter left renal cyst. Vague hypoechoic area within left kidney, potentially artifact but difficult completely exclude mass. Consider non contrast MR imaging (non contrast due to renal failure) to exclude solid left renal mass lesion.   Vance Gather, MD 06/19/2013, 9:31 AM PGY-1, [place] Intern pager: [phone], text pages welcome

## 2013-06-20 ENCOUNTER — Inpatient Hospital Stay (HOSPITAL_COMMUNITY): Payer: 59

## 2013-06-20 ENCOUNTER — Encounter (HOSPITAL_COMMUNITY): Payer: Self-pay | Admitting: Radiology

## 2013-06-20 LAB — PROTIME-INR
INR: 1.07 (ref 0.00–1.49)
Prothrombin Time: 13.7 seconds (ref 11.6–15.2)

## 2013-06-20 LAB — RENAL FUNCTION PANEL
Albumin: 1.1 g/dL — ABNORMAL LOW (ref 3.5–5.2)
BUN: 56 mg/dL — ABNORMAL HIGH (ref 6–23)
CO2: 18 mEq/L — ABNORMAL LOW (ref 19–32)
Chloride: 106 mEq/L (ref 96–112)
Creatinine, Ser: 6.65 mg/dL — ABNORMAL HIGH (ref 0.50–1.35)
GFR calc Af Amer: 9 mL/min — ABNORMAL LOW (ref >90)
Glucose, Bld: 303 mg/dL — ABNORMAL HIGH (ref 70–99)
Phosphorus: 6.9 mg/dL — ABNORMAL HIGH (ref 2.3–4.6)

## 2013-06-20 LAB — APTT: aPTT: 30 seconds (ref 24–37)

## 2013-06-20 LAB — GLUCOSE, CAPILLARY
Glucose-Capillary: 282 mg/dL — ABNORMAL HIGH (ref 70–99)
Glucose-Capillary: 243 mg/dL — ABNORMAL HIGH (ref 70–99)
Glucose-Capillary: 342 mg/dL — ABNORMAL HIGH (ref 70–99)
Glucose-Capillary: 283 mg/dL — ABNORMAL HIGH (ref 70–99)

## 2013-06-20 MED ORDER — HEPARIN SODIUM (PORCINE) 5000 UNIT/ML IJ SOLN
5000.0000 [IU] | Freq: Three times a day (TID) | INTRAMUSCULAR | Status: DC
  Filled 2013-06-20 (×3): qty 1

## 2013-06-20 MED ORDER — HEPARIN SODIUM (PORCINE) 5000 UNIT/ML IJ SOLN
5000.0000 [IU] | Freq: Three times a day (TID) | INTRAMUSCULAR | Status: DC
  Administered 2013-06-22 – 2013-07-01 (×26): 5000 [IU] via SUBCUTANEOUS
  Filled 2013-06-20 (×31): qty 1

## 2013-06-20 MED ORDER — CLONIDINE HCL 0.2 MG PO TABS
0.2000 mg | ORAL_TABLET | Freq: Two times a day (BID) | ORAL | Status: DC
  Administered 2013-06-20 – 2013-07-01 (×22): 0.2 mg via ORAL
  Filled 2013-06-20 (×22): qty 1
  Filled 2013-06-20: qty 2
  Filled 2013-06-20 (×3): qty 1

## 2013-06-20 NOTE — Progress Notes (Addendum)
Subjective:  Got first dose of solu-medrol last PM Renal biopsy set up for tomorrow Diuresing well Renal function unchanged  Objective Vital signs in last 24 hours: Filed Vitals:   06/19/13 1420 06/19/13 1836 06/19/13 2145 06/20/13 0530  BP: 150/67 156/69 178/87 176/83  Pulse: 77 72 66 70  Temp: 98.1 F (36.7 C) 98.2 F (36.8 C) 98.2 F (36.8 C) 97.8 F (36.6 C)  TempSrc: Oral Oral Oral Oral  Resp: 18 18 20 18   Height:      Weight:   112.8 kg (248 lb 10.9 oz)   SpO2: 98% 98% 100% 99%   Weight change: -0.599 kg (-1 lb 5.1 oz)  Intake/Output Summary (Last 24 hours) at 06/20/13 1209 Last data filed at 06/20/13 0534  Gross per 24 hour  Intake    534 ml  Output   1700 ml  Net  -1166 ml    Physical Exam:  Blood pressure 176/83, pulse 70, temperature 97.8 F (36.6 C), temperature source Oral, resp. rate 18, height 6' 1.5" (1.867 m), weight 112.8 kg (248 lb 10.9 oz), SpO2 99.00%. Gen: Tall WDWN AAM NAD  Skin: no rash, cyanosis  Neck: no JVD, no bruits or LAN  Chest: Clear to auscultation  Heart: regular, no rub or gallop  Abdomen: soft, no focal tenderness  Ext: 3+ pre-tibial pitting edema to the knees, 1+ post thighs  Neuro: alert, Ox3, no focal deficit  Heme/Lymph: no bruising or LAN  Weight trending (pre onset of edema weight 218 lb) 06/19/13 2145 112.8 kg (248 lb 10.9 oz)  06/18/13 1853 111.273 kg (245 lb 5 oz)  06/18/13 1153 113.399 kg (250 lb   Labs: Basic Metabolic Panel:  Recent Labs Lab 06/18/13 1240 06/18/13 2029 06/19/13 0538 06/20/13 0415  NA 140  --  138 137  K 3.3*  --  3.1* 3.6  CL 108  --  107 106  CO2 21  --  19 18*  GLUCOSE 125*  --  112* 303*  BUN 50*  --  51* 56*  CREATININE 6.49* 6.26* 6.63* 6.65*  CALCIUM 7.5*  --  7.5* 7.7*  PHOS  --   --   --  6.9*   Liver Function Tests:  Recent Labs Lab 06/18/13 1240 06/19/13 0538 06/20/13 0415  AST 12 12  --   ALT 10 11  --   ALKPHOS 79 75  --   BILITOT <0.1* 0.1*  --   PROT 5.5* 5.2*   --   ALBUMIN 1.3* 1.1* 1.1*   Recent Labs Lab 06/18/13 1240 06/18/13 2029 06/19/13 0538  WBC 6.3 6.8 6.4  NEUTROABS 4.1  --   --   HGB 11.2* 11.2* 10.9*  HCT 31.5* 31.5* 30.8*  MCV 79.9 79.5 79.2  PLT 270 270 261    Recent Labs Lab 06/19/13 0805 06/19/13 1200 06/19/13 1613 06/19/13 2149 06/20/13 0805  GLUCAP 124* 183* 155* 300* 282*   Results for Gary Lang, Gary Lang (MRN [id number]) as of 06/20/2013 12:12  Ref. Range 06/18/2013 13:11  Color, Urine Latest Range: YELLOW  YELLOW  APPearance Latest Range: CLEAR  CLOUDY (A)  Specific Gravity, Urine Latest Range: 1.005-1.030  1.022  pH Latest Range: 5.0-8.0  5.5  Glucose Latest Range: NEGATIVE mg/dL 250 (A)  Bilirubin Urine Latest Range: NEGATIVE  NEGATIVE  Ketones, ur Latest Range: NEGATIVE mg/dL NEGATIVE  Protein Latest Range: NEGATIVE mg/dL >300 (A)  Urobilinogen, UA Latest Range: 0.0-1.0 mg/dL 0.2  Nitrite Latest Range: NEGATIVE  NEGATIVE  Leukocytes, UA Latest  Range: NEGATIVE  NEGATIVE  Hgb urine dipstick Latest Range: NEGATIVE  MODERATE (A)  WBC, UA Latest Range: <3 WBC/hpf 3-6  RBC / HPF Latest Range: <3 RBC/hpf 0-2  Squamous Epithelial / LPF Latest Range: RARE  FEW (A)  Bacteria, UA Latest Range: RARE  FEW (A)  Casts Latest Range: NEGATIVE  GRANULAR CAST (A)   Results for Gary Lang, Gary Lang (MRN [id number]) as of 06/20/2013 12:12  Ref. Range 06/18/2013 20:00  PROTEIN CREATININE RATIO Latest Range: 0.00-0.15  12.41 (H)   Results for Gary Lang, Gary Lang (MRN [id number]) as of 06/20/2013 12:12  Ref. Range 06/18/2013 20:00  Urine Total Volume-UPROT No range found 2100  Collection Interval-UPROT No range found 24  Protein, Urine No range found 890  Protein, 24H Urine Latest Range: 50-100 mg/day 18690 (H)   Results for Gary Lang, Gary Lang (MRN [id number]) as of 06/20/2013 12:12  Ref. Range 06/19/2013 15:48  Hepatitis B Surface Ag Latest Range: NEGATIVE  NEGATIVE  HCV Ab Latest Range: NEGATIVE  NEGATIVE   Results for Gary Lang, Gary Lang  (MRN [id number]) as of 06/20/2013 12:12  Ref. Range 06/19/2013 15:48  HIV Latest Range: NON REACTIVE  NON REACTIVE   Results for Gary Lang, Gary Lang (MRN [id number]) as of 06/20/2013 12:12  Ref. Range 06/19/2013 05:38  Cholesterol Latest Range: 0-200 mg/dL 325 (H)  Triglycerides Latest Range: <150 mg/dL 145  HDL Latest Range: >39 mg/dL 46  LDL (calc) Latest Range: 0-99 mg/dL 250 (H)  VLDL Latest Range: 0-40 mg/dL 29  Total CHOL/HDL Ratio No range found 7.1   Studies/Results: [place] Retroperitoneal Ltd  06/20/2013   CLINICAL DATA:  62 year old male with acute onset of nephrotic syndrome. Percutaneous renal biopsy is requested. The patient has experienced significant edema and weight gain since the most recent prior ultrasound. Limited renal ultrasound is requested to evaluate for biopsy planning.  EXAM: LIMITED RENAL/URINARY TRACT ULTRASOUND COMPLETE  COMPARISON:  Prior renal ultrasound 06/10/2013  FINDINGS: Right Kidney  Length: 12.4 cm. Echogenic renal parenchyma. No hydronephrosis. No solid renal mass.  Left Kidney  Length: 12.7 cm. Echogenic renal parenchyma. No hydronephrosis. 3.6 x 3.1 cm simple cyst exophytic from the upper pole of the kidney.  IMPRESSION: 1. The lower pole of the right kidney is approachable for percutaneous biopsy. 2. Echogenic renal parenchyma suggests underlying medical renal disease. 3. Negative for hydronephrosis.   Electronically Signed   By: Jacqulynn Cadet M.D.   On: 06/20/2013 10:31   Medications:   . furosemide  80 mg Intravenous TID  . heparin  5,000 Units Subcutaneous Q8H  . [START ON 06/22/2013] heparin subcutaneous  5,000 Units Subcutaneous Q8H  . hydrochlorothiazide  12.5 mg Oral Daily  . insulin aspart  0-9 Units Subcutaneous TID WC  . methylPREDNISolone (SOLU-MEDROL) injection  500 mg Intravenous Q24H  . sodium chloride  3 mL Intravenous Q12H    I  have reviewed scheduled and prn medications.  Impression/Plan   62 yo AAM with abrupt onset of edema, 32  pound weight gain over the past 3 weeks, with renal failure believed to be new (pt reports labs done at his primary care all normal September, then was told after onset of edema and repeat labs - to which we have no weekend access - that he had a "kidney problem", was asked to complete a 24 hour urine and get ultrasound) Now presents with advanced renal failure/ nephrotic syndrome - suspect some sort of acute GN   1. Acute renal failure/ nephrotic syndrome - presumed to have occurred over the last  3 weeks based on patient report, with primary care that had initiated workup. No access to records today. Creatinine is rising in the hospital. Could have some form of RPGN. Renal biopsy is set up for Monday. Nephropathology form filled out and in the shadow chart.  In the interim I am pulsing with Solumedrol 500 mg ED X3 and had 1st dose yesterday (which should not affect the results of the biopsy). Multiple serologies still pending including ANA, complements, ANCA, SPEP, UPEPl hep werologies, HIV all negative.  Proteinuria 12-18 grams (UPC vs 24 hour). Bx will be the key for dx. Stop SQ heparin Try to get records from the primary care office on Monday 2. HTN - agree with avoidance of ACE or ARB at this time but needs BP controlled. Add clonidine. 3. DM - metformin on hold. Note that the steroids will likely alter his DM control 4. Hyperlipidemia - will require statin 5. RA - agree with holding abatacept but I am not finding reports of acute GN associated with it   Jamal Maes, MD [place] pager 06/20/2013, 12:09 PM

## 2013-06-20 NOTE — Progress Notes (Signed)
Family Medicine Teaching Service Daily Progress Note Intern Pager: [phone]  Patient name: Gary Lang Medical record number: [id number] Date of birth: 1951/06/26 Age: 62 y.o. Gender: male  Primary Care Provider: Jeri Lang Consultants: Nephrology Code Status: Full  Pt Overview and Major Events to Date:  10/19: Kidney Biopsy planned for 10/20  Assessment and Plan: Gary Lang is a 62 y.o.admitted for extremities swelling and AKI. PMH is significant for rheumatoid arthritis, HTN, and T2DM.   1. Renal failure, unknown chronicity. Cr: 6.65 continues raising. w/no known h/o renal impairment. Renal cyst noted on [place] is unchanged from previous scan 2 years ago. Suspect nephrotic syndrome. F/u by Nephrology we appreciate their recommendations. - Pt also has Bicarb in 18 and a serous GAP of 13. He is currently asymptomatic. wil discuss with Nephrology for further recs regarding this finding on labs today.  - Lasix 80mg  IV TID: Pt weight was 5lb difference in the same day (different scale) it is hard to assess this current weight/loss, but based on his most recent he has gained 3 lb.  - Hypokalemia has resolved - Hep B, C an HIV are negative - SPEP, UPEP, ANA, ANCA, C3, C4 results are pending.  - Holding abatacept, metformin and lisinopril.   2.  T2DM: Controlled w/ metformin alone. No injectible per pt.  - Hold home metformin due to potential metabolic acidosis given current renal state  - A1C 9.4 - CBG monitoring in the 150-300 range. Pt receiving Solumedrol Q24 for 3 doses.  - SSI total needed in the past 24h was 10 units. Will continue to monitor and adjust as needed.   3. CV: h/o HTN and hypertensive on admission. Only with HCTZ since holding Lisinopril. Continues to be hypertensive - pt may benefit from Amlodipine or Hydralazine. Will f/u Nephrology recommendations regarding antihypertensive trteatment they would prefer.   4. Rheumatoid arthritis: no recent flares and  excellent control on abatacept.  - hold abatacept 125mg  IM q/Mondays  FEN/GI: Renal diet Prophylaxis: Hep [place] TID  Disposition: Continued evaluation  Subjective: Pt reports no pain, denies dysuria, reports edema has improved very slightly.   Objective: Temp:  [97.8 F (36.6 C)-98.2 F (36.8 C)] 97.8 F (36.6 C) (10/19 0530) Pulse Rate:  [66-77] 70 (10/19 0530) Resp:  [18-20] 18 (10/19 0530) BP: (150-178)/(67-87) 176/83 mmHg (10/19 0530) SpO2:  [98 %-100 %] 99 % (10/19 0530) Weight:  [248 lb 10.9 oz (112.8 kg)] 248 lb 10.9 oz (112.8 kg) (10/18 2145)  Physical Exam: General:  NAD Cardiovascular: RRR, II/VI systolic murmur Respiratory: Nonlabored, CTAB on room air Abdomen: NABS, mildly distended, non-tender to palpation  Extremities: diffuse 2-3+ pitting edema of UE and LE, 2+ pulses  Laboratory:  Recent Labs Lab 06/18/13 1240 06/18/13 2029 06/19/13 0538  WBC 6.3 6.8 6.4  HGB 11.2* 11.2* 10.9*  HCT 31.5* 31.5* 30.8*  PLT 270 270 261    Recent Labs Lab 06/18/13 1240 06/18/13 2029 06/19/13 0538 06/20/13 0415  NA 140  --  138 137  K 3.3*  --  3.1* 3.6  CL 108  --  107 106  CO2 21  --  19 18*  BUN 50*  --  51* 56*  CREATININE 6.49* 6.26* 6.63* 6.65*  CALCIUM 7.5*  --  7.5* 7.7*  PROT 5.5*  --  5.2*  --   BILITOT <0.1*  --  0.1*  --   ALKPHOS 79  --  75  --   ALT 10  --  11  --  AST 12  --  12  --   GLUCOSE 125*  --  112* 303*    Imaging/Diagnostic Tests:  06/10/2013  IMPRESSION: Medical renal disease changes in both kidneys. 3.6 cm diameter left renal cyst. Vague hypoechoic area within left kidney, potentially artifact but difficult completely exclude mass. Consider non contrast MR imaging (non contrast due to renal failure) to exclude solid left renal mass lesion.   Gary Piloto de Gwendalyn Ege, MD 06/20/2013, 8:40 AM PGY-1, [place] Intern pager: [phone], text pages welcome

## 2013-06-20 NOTE — H&P (Signed)
Referring Physician: Dr. Lorrene Reid HPI: Gary Lang is an 62 y.o. male with PMHx of DM, RA, and HTN. He presented to the ED for further evaluation of recent US of his kidneys on 06/10/13 with c/o increased weight gain, edema upper extremities and lower extremities x 3 weeks. He denies any dark colored urine or flank pain. He states in September he had labwork at PCP which everything seemed to look good except sugar was slightly elevated he says. He then returned with c/o swelling and 24 hr urine and renal US performed as well as lasix added. Urine protein >300mg , granular casts and renal US revealed medical renal disease changes in both kidneys. Cr upon admission 06/18/13 6.49. Request for renal biopsy. He denies any chest pain or shortness of breath. He denies any fevers. He denies any bleeding, blood in his stool or urine. He states he did have a recent tooth infection prior to September PCP visit and that was treated with antibiotics, however no other medication changes. He does admit to a son who had to undergo a kidney transplant secondary to congenital renal hypoplasia.   Past Medical History:  Past Medical History  Diagnosis Date  . Hypertension   . Diabetes mellitus without complication   . Rheumatoid arthritis     Past Surgical History:  Past Surgical History  Procedure Laterality Date  . Hernia repair    . Neck surgery      Family History:  Family History  Problem Relation Age of Onset  . Cancer Mother     Social History:  reports that he quit smoking about 36 years ago. His smoking use included Cigarettes. He has a 5 pack-year smoking history. He has never used smokeless tobacco. He reports that he does not drink alcohol or use illicit drugs.  Allergies:  Allergies  Allergen Reactions  . Glyburide   . Methotrexate Derivatives Other (See Comments)    Patient cannot remember     Medication List    ASK your doctor about these medications       Abatacept 125 MG/ML Soln   Inject 1 mL into the skin once a week. Every monday     acetaminophen 500 MG tablet  Commonly known as:  TYLENOL  Take 2 tablets (1,000 mg total) by mouth 3 (three) times daily as needed for pain.     aspirin 81 MG EC tablet  Take 1 tablet (81 mg total) by mouth daily. Swallow whole.     furosemide 20 MG tablet  Commonly known as:  LASIX  Take 20 mg by mouth daily.     lisinopril-hydrochlorothiazide 10-12.5 MG per tablet  Commonly known as:  PRINZIDE,ZESTORETIC  Take 1 tablet by mouth daily.     metFORMIN 500 MG tablet  Commonly known as:  GLUCOPHAGE  Take 2 tablets (1,000 mg total) by mouth 2 (two) times daily with a meal.     MULTIVITAMIN PO  Take 1 tablet by mouth daily.       Please HPI for pertinent positives, otherwise complete 10 system ROS negative.  Physical Exam: BP 176/83  Pulse 70  Temp(Src) 97.8 F (36.6 C) (Oral)  Resp 18  Ht 6' 1.5" (1.867 m)  Wt 248 lb 10.9 oz (112.8 kg)  BMI 32.36 kg/m2  SpO2 99% Body mass index is 32.36 kg/(m^2).   General Appearance:  Alert, cooperative, no distress  Head:  Normocephalic, without obvious abnormality, atraumatic  Neck: Supple, symmetrical, trachea midline  Lungs:   Clear to auscultation  bilaterally, no w/r/r, respirations unlabored without use of accessory muscles.  Chest Wall:  No tenderness or deformity  Heart:  Regular rate and rhythm, S1, S2 normal, no murmur, rub or gallop.  Abdomen:   Soft, non-tender, non distended.  Extremities: 2+ pitting edema LE bilaterally  Pulses: 1+ and symmetric  Neurologic: Normal affect, no gross deficits.   Results for orders placed during the hospital encounter of 06/18/13 (from the past 48 hour(s))  CBC WITH DIFFERENTIAL     Status: Abnormal   Collection Time    06/18/13 12:40 PM      Result Value Range   WBC 6.3  4.0 - 10.5 K/uL   RBC 3.94 (*) 4.22 - 5.81 MIL/uL   Hemoglobin 11.2 (*) 13.0 - 17.0 g/dL   HCT 31.5 (*) 39.0 - 52.0 %   MCV 79.9  78.0 - 100.0 fL   MCH  28.4  26.0 - 34.0 pg   MCHC 35.6  30.0 - 36.0 g/dL   RDW 12.7  11.5 - 15.5 %   Platelets 270  150 - 400 K/uL   Neutrophils Relative % 65  43 - 77 %   Neutro Abs 4.1  1.7 - 7.7 K/uL   Lymphocytes Relative 25  12 - 46 %   Lymphs Abs 1.6  0.7 - 4.0 K/uL   Monocytes Relative 6  3 - 12 %   Monocytes Absolute 0.4  0.1 - 1.0 K/uL   Eosinophils Relative 3  0 - 5 %   Eosinophils Absolute 0.2  0.0 - 0.7 K/uL   Basophils Relative 0  0 - 1 %   Basophils Absolute 0.0  0.0 - 0.1 K/uL  COMPREHENSIVE METABOLIC PANEL     Status: Abnormal   Collection Time    06/18/13 12:40 PM      Result Value Range   Sodium 140  135 - 145 mEq/L   Potassium 3.3 (*) 3.5 - 5.1 mEq/L   Chloride 108  96 - 112 mEq/L   CO2 21  19 - 32 mEq/L   Glucose, Bld 125 (*) 70 - 99 mg/dL   BUN 50 (*) 6 - 23 mg/dL   Creatinine, Ser 6.49 (*) 0.50 - 1.35 mg/dL   Calcium 7.5 (*) 8.4 - 10.5 mg/dL   Total Protein 5.5 (*) 6.0 - 8.3 g/dL   Albumin 1.3 (*) 3.5 - 5.2 g/dL   AST 12  0 - 37 U/L   ALT 10  0 - 53 U/L   Alkaline Phosphatase 79  39 - 117 U/L   Total Bilirubin <0.1 (*) 0.3 - 1.2 mg/dL   Comment: REPEATED TO VERIFY   GFR calc non Af Amer 8 (*) >90 mL/min   GFR calc Af Amer 9 (*) >90 mL/min   Comment: (NOTE)     The eGFR has been calculated using the CKD EPI equation.     This calculation has not been validated in all clinical situations.     eGFR's persistently <90 mL/min signify possible Chronic Kidney     Disease.  URINALYSIS, ROUTINE W REFLEX MICROSCOPIC     Status: Abnormal   Collection Time    06/18/13  1:11 PM      Result Value Range   Color, Urine YELLOW  YELLOW   APPearance CLOUDY (*) CLEAR   Specific Gravity, Urine 1.022  1.005 - 1.030   pH 5.5  5.0 - 8.0   Glucose, UA 250 (*) NEGATIVE mg/dL   Hgb urine dipstick MODERATE (*)  NEGATIVE   Bilirubin Urine NEGATIVE  NEGATIVE   Ketones, ur NEGATIVE  NEGATIVE mg/dL   Protein, ur >300 (*) NEGATIVE mg/dL   Urobilinogen, UA 0.2  0.0 - 1.0 mg/dL   Nitrite NEGATIVE   NEGATIVE   Leukocytes, UA NEGATIVE  NEGATIVE  URINE MICROSCOPIC-ADD ON     Status: Abnormal   Collection Time    06/18/13  1:11 PM      Result Value Range   Squamous Epithelial / LPF FEW (*) RARE   WBC, UA 3-6  <3 WBC/hpf   RBC / HPF 0-2  <3 RBC/hpf   Comment: RESULT CHECKED.   Bacteria, UA FEW (*) RARE   Casts GRANULAR CAST (*) NEGATIVE  URINE CULTURE     Status: None   Collection Time    06/18/13  1:11 PM      Result Value Range   Specimen Description URINE, CLEAN CATCH     Special Requests NONE     Culture  Setup Time       Value: 06/18/2013 15:09     Performed at [facility] Count       Value: 50,000 COLONIES/ML     Performed at [facility]   Culture       Value: Multiple bacterial morphotypes present, none predominant. Suggest appropriate recollection if clinically indicated.     Performed at [facility]   Report Status 06/19/2013 FINAL    GLUCOSE, CAPILLARY     Status: None   Collection Time    06/18/13  6:58 PM      Result Value Range   Glucose-Capillary 86  70 - 99 mg/dL  PROTEIN, URINE, 24 HOUR     Status: Abnormal   Collection Time    06/18/13  8:00 PM      Result Value Range   Urine Total Volume-UPROT 2100     Collection Interval-UPROT 24     Protein, Urine 890     Comment: RESULTS CONFIRMED BY MANUAL DILUTION   Protein, 24H Urine 18690 (*) 50 - 100 mg/day  PROTEIN / CREATININE RATIO, URINE     Status: Abnormal   Collection Time    06/18/13  8:00 PM      Result Value Range   Creatinine, Urine 71.77     Total Protein, Urine 890.4     Comment: NO NORMAL RANGE ESTABLISHED FOR THIS TEST     RESULTS CONFIRMED BY MANUAL DILUTION   PROTEIN CREATININE RATIO 12.41 (*) 0.00 - 0.15  CBC     Status: Abnormal   Collection Time    06/18/13  8:29 PM      Result Value Range   WBC 6.8  4.0 - 10.5 K/uL   RBC 3.96 (*) 4.22 - 5.81 MIL/uL   Hemoglobin 11.2 (*) 13.0 - 17.0 g/dL   HCT 31.5 (*) 39.0 - 52.0 %   MCV 79.5  78.0 - 100.0 fL    MCH 28.3  26.0 - 34.0 pg   MCHC 35.6  30.0 - 36.0 g/dL   RDW 12.7  11.5 - 15.5 %   Platelets 270  150 - 400 K/uL  CREATININE, SERUM     Status: Abnormal   Collection Time    06/18/13  8:29 PM      Result Value Range   Creatinine, Ser 6.26 (*) 0.50 - 1.35 mg/dL   GFR calc non Af Amer 9 (*) >90 mL/min   GFR calc Af Amer 10 (*) >  90 mL/min   Comment: (NOTE)     The eGFR has been calculated using the CKD EPI equation.     This calculation has not been validated in all clinical situations.     eGFR's persistently <90 mL/min signify possible Chronic Kidney     Disease.  GLUCOSE, CAPILLARY     Status: Abnormal   Collection Time    06/18/13  9:33 PM      Result Value Range   Glucose-Capillary 177 (*) 70 - 99 mg/dL  COMPREHENSIVE METABOLIC PANEL     Status: Abnormal   Collection Time    06/19/13  5:38 AM      Result Value Range   Sodium 138  135 - 145 mEq/L   Potassium 3.1 (*) 3.5 - 5.1 mEq/L   Chloride 107  96 - 112 mEq/L   CO2 19  19 - 32 mEq/L   Glucose, Bld 112 (*) 70 - 99 mg/dL   BUN 51 (*) 6 - 23 mg/dL   Creatinine, Ser 6.63 (*) 0.50 - 1.35 mg/dL   Calcium 7.5 (*) 8.4 - 10.5 mg/dL   Total Protein 5.2 (*) 6.0 - 8.3 g/dL   Albumin 1.1 (*) 3.5 - 5.2 g/dL   AST 12  0 - 37 U/L   ALT 11  0 - 53 U/L   Alkaline Phosphatase 75  39 - 117 U/L   Total Bilirubin 0.1 (*) 0.3 - 1.2 mg/dL   GFR calc non Af Amer 8 (*) >90 mL/min   GFR calc Af Amer 9 (*) >90 mL/min   Comment: (NOTE)     The eGFR has been calculated using the CKD EPI equation.     This calculation has not been validated in all clinical situations.     eGFR's persistently <90 mL/min signify possible Chronic Kidney     Disease.  CBC     Status: Abnormal   Collection Time    06/19/13  5:38 AM      Result Value Range   WBC 6.4  4.0 - 10.5 K/uL   RBC 3.89 (*) 4.22 - 5.81 MIL/uL   Hemoglobin 10.9 (*) 13.0 - 17.0 g/dL   HCT 30.8 (*) 39.0 - 52.0 %   MCV 79.2  78.0 - 100.0 fL   MCH 28.0  26.0 - 34.0 pg   MCHC 35.4  30.0 -  36.0 g/dL   RDW 12.8  11.5 - 15.5 %   Platelets 261  150 - 400 K/uL  HEMOGLOBIN A1C     Status: Abnormal   Collection Time    06/19/13  5:38 AM      Result Value Range   Hemoglobin A1C 9.4 (*) <5.7 %   Comment: (NOTE)                                                                               According to the ADA Clinical Practice Recommendations for 2011, when     HbA1c is used as a screening test:      >=6.5%   Diagnostic of Diabetes Mellitus               (if abnormal result is confirmed)  5.7-6.4%   Increased risk of developing Diabetes Mellitus     References:Diagnosis and Classification of Diabetes Mellitus,Diabetes     [id number] 1):S62-S69 and Standards of Medical Care in             Diabetes - 2011,Diabetes [id number] (Suppl 1):S11-S61.   Mean Plasma Glucose 223 (*) <117 mg/dL   Comment: Performed at [facility]     Status: Abnormal   Collection Time    06/19/13  5:38 AM      Result Value Range   Cholesterol 325 (*) 0 - 200 mg/dL   Triglycerides 145  <150 mg/dL   HDL 46  >39 mg/dL   Total CHOL/HDL Ratio 7.1     VLDL 29  0 - 40 mg/dL   LDL Cholesterol 250 (*) 0 - 99 mg/dL   Comment:            Total Cholesterol/HDL:CHD Risk     Coronary Heart Disease Risk Table                         Men   Women      1/2 Average Risk   3.4   3.3      Average Risk       5.0   4.4      2 X Average Risk   9.6   7.1      3 X Average Risk  23.4   11.0                Use the calculated Patient Ratio     above and the CHD Risk Table     to determine the patient's CHD Risk.                ATP III CLASSIFICATION (LDL):      <100     mg/dL   Optimal      100-129  mg/dL   Near or Above                        Optimal      130-159  mg/dL   Borderline      160-189  mg/dL   High      >190     mg/dL   Very High  GLUCOSE, CAPILLARY     Status: Abnormal   Collection Time    06/19/13  8:05 AM      Result Value Range   Glucose-Capillary 124 (*) 70 - 99  mg/dL   Comment 1 Notify RN     Comment 2 Documented in Chart    GLUCOSE, CAPILLARY     Status: Abnormal   Collection Time    06/19/13 12:00 PM      Result Value Range   Glucose-Capillary 183 (*) 70 - 99 mg/dL   Comment 1 Documented in Chart     Comment 2 Notify RN    HEPATITIS C ANTIBODY (REFLEX)     Status: None   Collection Time    06/19/13  3:48 PM      Result Value Range   HCV Ab NEGATIVE  NEGATIVE   Comment: Performed at [place] (ROUTINE TESTING)     Status: None   Collection Time    06/19/13  3:48 PM      Result Value Range   HIV NON REACTIVE  NON REACTIVE   Comment: Performed  at [facility]     Status: None   Collection Time    06/19/13  3:48 PM      Result Value Range   Hepatitis B Surface Ag NEGATIVE  NEGATIVE   Comment: Performed at [place], CAPILLARY     Status: Abnormal   Collection Time    06/19/13  4:13 PM      Result Value Range   Glucose-Capillary 155 (*) 70 - 99 mg/dL   Comment 1 Notify RN     Comment 2 Documented in Chart    GLUCOSE, CAPILLARY     Status: Abnormal   Collection Time    06/19/13  9:49 PM      Result Value Range   Glucose-Capillary 300 (*) 70 - 99 mg/dL  RENAL FUNCTION PANEL     Status: Abnormal   Collection Time    06/20/13  4:15 AM      Result Value Range   Sodium 137  135 - 145 mEq/L   Potassium 3.6  3.5 - 5.1 mEq/L   Chloride 106  96 - 112 mEq/L   CO2 18 (*) 19 - 32 mEq/L   Glucose, Bld 303 (*) 70 - 99 mg/dL   BUN 56 (*) 6 - 23 mg/dL   Creatinine, Ser 6.65 (*) 0.50 - 1.35 mg/dL   Calcium 7.7 (*) 8.4 - 10.5 mg/dL   Phosphorus 6.9 (*) 2.3 - 4.6 mg/dL   Albumin 1.1 (*) 3.5 - 5.2 g/dL   GFR calc non Af Amer 8 (*) >90 mL/min   GFR calc Af Amer 9 (*) >90 mL/min   Comment: (NOTE)     The eGFR has been calculated using the CKD EPI equation.     This calculation has not been validated in all clinical situations.     eGFR's persistently <90  mL/min signify possible Chronic Kidney     Disease.  GLUCOSE, CAPILLARY     Status: Abnormal   Collection Time    06/20/13  8:05 AM      Result Value Range   Glucose-Capillary 282 (*) 70 - 99 mg/dL   Comment 1 Notify RN     Comment 2 Documented in Chart     No results found.  Assessment/Plan Acute renal failure/probable nephrotic syndrome. Request for image guided renal biopsy. Renal US 06/10/13, will repeat US today given significant increase in weight for pre-evaluation for image guided biopsy on 10/20. Hold sq heparin, keep NPO after midnight, biopsy for 10/20. Risks and Benefits discussed with the patient. All of the patient's questions were answered, patient is agreeable to proceed. Consent signed and in chart. HTN, BP systolic [id number], BP must be <176mmHg for biopsy on 10/20 DM. RATsosie Billing D PA-C 06/20/2013, 10:29 AM

## 2013-06-21 ENCOUNTER — Inpatient Hospital Stay (HOSPITAL_COMMUNITY): Payer: 59

## 2013-06-21 LAB — CBC
HCT: 29.1 % — ABNORMAL LOW (ref 39.0–52.0)
Hemoglobin: 10.3 g/dL — ABNORMAL LOW (ref 13.0–17.0)
MCH: 28.2 pg (ref 26.0–34.0)
MCH: 27.8 pg (ref 26.0–34.0)
MCHC: 35.4 g/dL (ref 30.0–36.0)
MCV: 77.6 fL — ABNORMAL LOW (ref 78.0–100.0)
MCV: 78.6 fL (ref 78.0–100.0)
Platelets: 269 10*3/uL (ref 150–400)
Platelets: 264 10*3/uL (ref 150–400)
RBC: 3.8 MIL/uL — ABNORMAL LOW (ref 4.22–5.81)
RDW: 12.8 % (ref 11.5–15.5)

## 2013-06-21 LAB — RENAL FUNCTION PANEL
CO2: 17 mEq/L — ABNORMAL LOW (ref 19–32)
Calcium: 7.4 mg/dL — ABNORMAL LOW (ref 8.4–10.5)
Chloride: 103 mEq/L (ref 96–112)
Creatinine, Ser: 7.02 mg/dL — ABNORMAL HIGH (ref 0.50–1.35)
GFR calc non Af Amer: 7 mL/min — ABNORMAL LOW (ref >90)
Glucose, Bld: 311 mg/dL — ABNORMAL HIGH (ref 70–99)
Potassium: 3.7 mEq/L (ref 3.5–5.1)

## 2013-06-21 LAB — GLUCOSE, CAPILLARY
Glucose-Capillary: 284 mg/dL — ABNORMAL HIGH (ref 70–99)
Glucose-Capillary: 256 mg/dL — ABNORMAL HIGH (ref 70–99)
Glucose-Capillary: 352 mg/dL — ABNORMAL HIGH (ref 70–99)

## 2013-06-21 LAB — MPO/PR-3 (ANCA) ANTIBODIES: Serine Protease 3: 1 AU/mL (ref <20)

## 2013-06-21 LAB — GLOMERULAR BASEMENT MEMBRANE ANTIBODIES: GBM Ab: <1 AU/mL (ref <20)

## 2013-06-21 MED ORDER — INSULIN ASPART 100 UNIT/ML ~~LOC~~ SOLN
0.0000 [IU] | Freq: Three times a day (TID) | SUBCUTANEOUS | Status: DC
  Administered 2013-06-21: 11 [IU] via SUBCUTANEOUS
  Administered 2013-06-21: 20 [IU] via SUBCUTANEOUS
  Administered 2013-06-22: 11 [IU] via SUBCUTANEOUS
  Administered 2013-06-22: 3 [IU] via SUBCUTANEOUS
  Administered 2013-06-22 – 2013-06-24 (×6): 7 [IU] via SUBCUTANEOUS
  Administered 2013-06-24: 4 [IU] via SUBCUTANEOUS
  Administered 2013-06-25 – 2013-06-26 (×5): 11 [IU] via SUBCUTANEOUS
  Administered 2013-06-26 – 2013-06-27 (×3): 7 [IU] via SUBCUTANEOUS
  Administered 2013-06-27: 11 [IU] via SUBCUTANEOUS
  Administered 2013-06-28: 7 [IU] via SUBCUTANEOUS
  Administered 2013-06-29: 4 [IU] via SUBCUTANEOUS
  Administered 2013-06-29: 7 [IU] via SUBCUTANEOUS
  Administered 2013-06-30: 4 [IU] via SUBCUTANEOUS
  Administered 2013-06-30: 7 [IU] via SUBCUTANEOUS
  Administered 2013-07-01: 4 [IU] via SUBCUTANEOUS

## 2013-06-21 MED ORDER — MIDAZOLAM HCL 2 MG/2ML IJ SOLN
INTRAMUSCULAR | Status: AC
  Filled 2013-06-21: qty 4

## 2013-06-21 MED ORDER — MIDAZOLAM HCL 2 MG/2ML IJ SOLN
INTRAMUSCULAR | Status: AC | PRN
  Administered 2013-06-21 (×2): 1 mg via INTRAVENOUS
  Administered 2013-06-21: 2 mg via INTRAVENOUS

## 2013-06-21 MED ORDER — ATORVASTATIN CALCIUM 80 MG PO TABS
80.0000 mg | ORAL_TABLET | Freq: Every day | ORAL | Status: DC
  Administered 2013-06-21 – 2013-06-30 (×10): 80 mg via ORAL
  Filled 2013-06-21 (×11): qty 1

## 2013-06-21 MED ORDER — FENTANYL CITRATE 0.05 MG/ML IJ SOLN
INTRAMUSCULAR | Status: AC
  Filled 2013-06-21: qty 4

## 2013-06-21 MED ORDER — FENTANYL CITRATE 0.05 MG/ML IJ SOLN
INTRAMUSCULAR | Status: AC | PRN
  Administered 2013-06-21: 100 ug via INTRAVENOUS
  Administered 2013-06-21 (×2): 50 ug via INTRAVENOUS

## 2013-06-21 MED ORDER — INSULIN GLARGINE 100 UNIT/ML ~~LOC~~ SOLN
5.0000 [IU] | Freq: Every day | SUBCUTANEOUS | Status: DC
  Administered 2013-06-21 – 2013-06-25 (×5): 5 [IU] via SUBCUTANEOUS
  Filled 2013-06-21 (×5): qty 0.05

## 2013-06-21 MED ORDER — INSULIN ASPART 100 UNIT/ML ~~LOC~~ SOLN
20.0000 [IU] | Freq: Once | SUBCUTANEOUS | Status: AC
  Administered 2013-06-21: 20 [IU] via SUBCUTANEOUS

## 2013-06-21 NOTE — ED Notes (Signed)
Patient denies pain and is resting comfortably.  

## 2013-06-21 NOTE — Discharge Summary (Signed)
[facility] Discharge Summary  Patient name: Gary Lang Medical record number: [id number] Date of birth: [birth year] Age: 62 y.o. Gender: male Date of Admission: 06/18/2013  Date of Discharge: 07/01/2013 Admitting Physician: Lind Covert, MD  Primary Care Provider: Jeri Modena Consultants: Nephrology, interventional radiology, vascular surgery  Indication for Hospitalization: Edema of extremities  Discharge Diagnoses/Problem List:  Acute renal failure Nephrotic syndrome Hypertension Type II diabetes mellitus Rheumatoid arthritis  Disposition: Discharged home  Discharge Condition: Stable, improved  Discharge Exam:  General: 62 yo male sitting in bed in HD, tired-appearing  Cardiovascular: RRR, soft II/VI systolic murmur, AVF in left forearm with palpable thrill Respiratory: Nonlabored, CTAB on room air  Abdomen: NABS, NT, ND  Extremities: diffuse 3+ pitting edema of UE and LE, nontender, 2+ pulses, no erythema  Brief Hospital Course:  Gary Lang is a 62 year old male with a past medical history including hypertension, type II diabetes mellitus, and rheumatoid arthritis, who presented to the ED at [facility] on 10/17 for edema of his extremities. He had gained 30 pounds over the previous month, and was seen at his PCP a few days prior to admission where he was told to stop metformin and lisinopril and to start taking lasix. His creatinine on admission was 6.49 and 24 hour urine collection showed more than 18 grams of proteinuria. He was started on high-dose IV steroids, high dose lasix, and high-intensity statin therapy for LDL of 250 secondary to this nephrotic syndrome. A percutaneous renal biopsy was performed on 06/21/2013 and showed focal segmental glomerulosclerosis with acute tubular necrosis. Diuresis was insufficient and on 10/27 an AV fistula was placed at the left forearm and a tunneled dialysis catheter was place at  the right IJ. He underwent dialysis the following 3 days with improvement in edema. Outpatient dialysis was arranged for MWF at [facility]. On 07/01/2013, Gary Lang was tolerating treatment without hemodynamic instability and he was discharged on oral steroids which will be continued for months and tapered per nephrology at kidney center.   Gary Lang had a hemoglobin A1c of 9.4% on metformin alone and was started on lantus and novolog insulins. Hyperglycemia was exacerbated by steroid therapy. During hospitalization diabetic education was frequently provided and he was discharged on lantus 20u each morning and Novolog 5u mealtime based on requirement in the hospital.   He has longstanding rheumatoid arthritis well-controlled on abatacept, which was held for suspicion of its contribution to renal failure. This was restarted on discharge, as it is unlikely to have played a role.   Issues for Follow Up:  - Follow up diabetic education/medical understanding as pt is to begin insulin (lantus, novolog) on discharge.  - Follow up and reinforce dietary education for ESRD. - Follow up status of edema with continued dialysis.   Significant Procedures:  Percutaneous renal biopsy of the lower pole of the left kidney 06/21/2013 Placement of left AV fistula, right IJ hemodialysis catheter by Dr. Donnetta Hutching 06/28/2013  Significant Labs and Imaging:   Recent Labs Lab 06/30/13 0719  WBC 9.0  HGB 10.4*  HCT 29.0*  PLT 240    Recent Labs Lab 06/26/13 0845 06/27/13 0550 06/28/13 2225 06/29/13 0430 06/30/13 0719  NA 130* 133* 130* 134* 135  K 3.6 3.0* 2.9* 3.1* 3.1*  CL 96 98 95* 98 98  CO2 17* 17* 19 19 22   GLUCOSE 296* 262* 255* 200* 260*  BUN 114* 117* 119* 118* 101*  CREATININE 7.76* 7.70* 7.22*  7.32* 7.16*  CALCIUM 7.7* 7.5* 7.6* 7.4* 7.5*  PHOS 8.8* 8.9* 9.1* 9.2* 8.6*  ALBUMIN 1.2* 1.2* 1.0* 1.1* 1.1*   Renal Biopsy: Focal Segmental Glomerulosclerosis, collapsing variant.  Acute tubular necrosis.     06/18/2013 13:11   Color, Urine  YELLOW   APPearance  CLOUDY (A)   Specific Gravity, Urine  1.022   pH  5.5   Glucose  250 (A)   Bilirubin Urine  NEGATIVE   Ketones, ur  NEGATIVE   Protein  >300 (A)   Urobilinogen, UA  0.2   Nitrite  NEGATIVE   Leukocytes, UA  NEGATIVE   Hgb urine dipstick  MODERATE (A)   WBC, UA  3-6   RBC / HPF  0-2   Squamous Epithelial / LPF  FEW (A)   Bacteria, UA  FEW (A)   Casts  GRANULAR CAST (A)    PT/INR: 13.7/1.07  ANA: Neg  ASO: < 25  GBM Ab: < 1  MPO Ab: < 1  Ser Protease-3: 1  C3: 199  C4: 44  Alpha-1-globulin: 6.8  Alpha-2-globulin: 25.8  Heb B SAg: Neg  HCV Ab: Neg  Hep B: negative  HIV: Non-reactive  U Protein: Creatinine = 12.41  24hr protein: 18.690g  UCx 10/17: 50k colonies of multiple bacterial morphotypes, none predominant    06/19/2013 05:38   Cholesterol  325 (H)   Triglycerides  145   HDL  46   LDL (calc)  250 (H)   VLDL  29   Total CHOL/HDL Ratio  7.1    ULTRASOUND-GUIDED CORE BIOPSY OF LEFT KIDNEY 10/20  FINDINGS:  Both kidneys were visible from a posterior flank approach. The lower  pole cortex of the left kidney was felt to be slightly more visible  than the right kidney. Solid and intact core biopsy samples were  obtained. There was no immediate ultrasound evidence of a bleeding  complication.  IMPRESSION:  Ultrasound-guided core biopsy performed of the left kidney.   LIMITED RENAL/URINARY TRACT ULTRASOUND COMPLETE  COMPARISON: Prior renal ultrasound 06/10/2013  FINDINGS:  Right Kidney  Length: 12.4 cm. Echogenic renal parenchyma. No hydronephrosis. No  solid renal mass.  Left Kidney  Length: 12.7 cm. Echogenic renal parenchyma. No hydronephrosis. 3.6  x 3.1 cm simple cyst exophytic from the upper pole of the kidney.  IMPRESSION:  1. The lower pole of the right kidney is approachable for  percutaneous biopsy.  2. Echogenic renal parenchyma suggests underlying medical renal   disease.  3. Negative for hydronephrosis.   Results/Tests Pending at Time of Discharge: None  Discharge Medications:    Medication List    STOP taking these medications       metFORMIN 500 MG tablet  Commonly known as:  GLUCOPHAGE      TAKE these medications       Abatacept 125 MG/ML Sosy  Inject 1 mL into the skin once a week. Every monday     acetaminophen 500 MG tablet  Commonly known as:  TYLENOL  Take 2 tablets (1,000 mg total) by mouth 3 (three) times daily as needed for pain.     aspirin 81 MG EC tablet  Take 1 tablet (81 mg total) by mouth daily. Swallow whole.     atorvastatin 80 MG tablet  Commonly known as:  LIPITOR  Take 1 tablet (80 mg total) by mouth daily.     MULTIVITAMIN PO  Take 1 tablet by mouth daily.     predniSONE 20 MG tablet  Commonly known as:  DELTASONE  Take 3 tablets (60 mg total) by mouth daily with breakfast.      ASK your doctor about these medications       furosemide 20 MG tablet  Commonly known as:  LASIX  Take 20 mg by mouth daily.     lisinopril-hydrochlorothiazide 10-12.5 MG per tablet  Commonly known as:  PRINZIDE,ZESTORETIC  Take 1 tablet by mouth daily.        Discharge Instructions: Please refer to Patient Instructions section of EMR for full details.  Patient was counseled important signs and symptoms that should prompt return to medical care, changes in medications, dietary instructions, activity restrictions, and follow up appointments.   Follow-Up Appointments: Follow-up Information   Follow up with MERRELL, DAVID, MD On 07/05/2013. (11:00am)    Specialty:  Family Medicine   Contact information:   [address] [place] [address] [phone]      Follow up with nephrology at dialysis.   Vance Gather, MD 07/01/2013, 11:47 AM PGY-1, [place]

## 2013-06-21 NOTE — Progress Notes (Signed)
Family Medicine Teaching Service Attending Note  On 10-19 I discussed patient Gary Lang  with Dr. Thomes Dinning and reviewed their note for today.  I agree with their assessment and plan.

## 2013-06-21 NOTE — Progress Notes (Signed)
Family Medicine Teaching Service Daily Progress Note Intern Pager: [phone]  Patient name: Gary Lang Medical record number: [id number] Date of birth: Aug 11, 1951 Age: 62 y.o. Gender: male  Primary Care Provider: Jeri Modena Consultants: Nephrology Code Status: Full  Pt Overview and Major Events to Date:  10/18: IV Solumedrol started 10/19: 24hr urine with 12-18g proteinuria 10/20: Perc. renal Bx  Assessment and Plan: Gary Lang is a 62 y.o.admitted for extremities swelling and AKI. PMH is significant for rheumatoid arthritis, HTN, and T2DM.   1. Renal failure, acute. Concerning for rapidly progressive GN - Percutaneous renal biopsy today, continuing IV steroids - Lasix 80mg  IV TID: Wt stable since admission (up 32lbs from baseline), Cr. rising steadily limiting lasix efficacy.   - Hep B, C and HIV serology negative - SPEP, UPEP, ANA, ANCA, C3, C4 results are pending.  - aHolding abatacept, metformin nd lisinopril.   2.  T2DM: A1c 9.4 on metformin alone - Hold metformin - SSI 24hr requirement increased on IV steroids (5 -> 15) in setting of high A1c.  - Adding lantus 5u daily, continuing resistant SSI  3. CV: HTN. Holding lisinopril, continuing HCTZ.  - BP improved after addition of clonidine.   4. Rheumatoid arthritis: no recent flares and excellent control on abatacept.  - Holding abatacept 125mg  IM qMondays  FEN/GI: Renal diet Prophylaxis: Hep  TID  Disposition: Continuing IV steroid therapy and diuresis as well as diagnostic studies.   Subjective: Pt reports no pain, denies dysuria, CP, HA, dizziness, reports edema has improved very slightly.    Objective: Temp:  [97.3 F (36.3 C)-98.4 F (36.9 C)] 97.3 F (36.3 C) (10/20 1134) Pulse Rate:  [53-86] 61 (10/20 1134) Resp:  [14-18] 16 (10/20 1134) BP: (129-168)/(67-83) 135/72 mmHg (10/20 1134) SpO2:  [8 %-100 %] 96 % (10/20 1134) Weight:  [251 lb 12.3 oz (114.2 kg)] 251 lb 12.3 oz (114.2 kg) (10/19  2108)  Physical Exam: General:  NAD Cardiovascular: RRR, II/VI systolic murmur Respiratory: Nonlabored, CTAB on room air Abdomen: NABS, mildly distended, non-tender to palpation  Extremities: diffuse 2-3+ pitting edema of UE and LE, 2+ pulses  Laboratory:  Recent Labs Lab 06/18/13 2029 06/19/13 0538 06/21/13 0535  WBC 6.8 6.4 8.1  HGB 11.2* 10.9* 10.7*  HCT 31.5* 30.8* 29.5*  PLT 270 261 269    Recent Labs Lab 06/18/13 1240  06/19/13 0538 06/20/13 0415 06/21/13 0535  NA 140  --  138 137 135  K 3.3*  --  3.1* 3.6 3.7  CL 108  --  107 106 103  CO2 21  --  19 18* 17*  BUN 50*  --  51* 56* 65*  CREATININE 6.49*  < > 6.63* 6.65* 7.02*  CALCIUM 7.5*  --  7.5* 7.7* 7.4*  PROT 5.5*  --  5.2*  --   --   BILITOT <0.1*  --  0.1*  --   --   ALKPHOS 79  --  75  --   --   ALT 10  --  11  --   --   AST 12  --  12  --   --   GLUCOSE 125*  --  112* 303* 311*  < > = values in this interval not displayed.  Imaging/Diagnostic Tests:  06/10/2013  IMPRESSION: Medical renal disease changes in both kidneys. 3.6 cm diameter left renal cyst. Vague hypoechoic area within left kidney, potentially artifact but difficult completely exclude mass. Consider non contrast MR imaging (non contrast due to  renal failure) to exclude solid left renal mass lesion.   Vance Gather, MD 06/21/2013, 12:34 PM PGY-1, [place] Intern pager: [phone], text pages welcome

## 2013-06-21 NOTE — Procedures (Signed)
Procedure:  Ultrasound guided left renal biopsy Findings:  52 G core biopsy x 2 of LP of left kidney.  Good solid core samples obtained.  No immediate complications.

## 2013-06-21 NOTE — Progress Notes (Signed)
Patient ID: Gary Lang, male   DOB: [birth year], 62 y.o.   MRN: [id number] S:pt is doing well post renal biopsy O:BP 135/72  Pulse 61  Temp(Src) 97.3 F (36.3 C) (Oral)  Resp 16  Ht 6' 1.5" (1.867 m)  Wt 114.2 kg (251 lb 12.3 oz)  BMI 32.76 kg/m2  SpO2 96%  Intake/Output Summary (Last 24 hours) at 06/21/13 1351 Last data filed at 06/21/13 0849  Gross per 24 hour  Intake    414 ml  Output   1250 ml  Net   -836 ml   Intake/Output: I/O last 3 completed shifts: In: [id number] [P.O.:600; IV Piggyback:54] Out: 2225 [Urine:2225]  Intake/Output this shift:  Total I/O In: 0  Out: 25 [Urine:25] Weight change: 1.4 kg (3 lb 1.4 oz) Gen:WD WN AAM in NAD CVS:no rub Resp:cta [id number] Ext:+1 Edema   Recent Labs Lab 06/18/13 1240 06/18/13 2029 06/19/13 0538 06/20/13 0415 06/21/13 0535  NA 140  --  138 137 135  K 3.3*  --  3.1* 3.6 3.7  CL 108  --  107 106 103  CO2 21  --  19 18* 17*  GLUCOSE 125*  --  112* 303* 311*  BUN 50*  --  51* 56* 65*  CREATININE 6.49* 6.26* 6.63* 6.65* 7.02*  ALBUMIN 1.3*  --  1.1* 1.1* 1.1*  CALCIUM 7.5*  --  7.5* 7.7* 7.4*  PHOS  --   --   --  6.9* 7.7*  AST 12  --  12  --   --   ALT 10  --  11  --   --    Liver Function Tests:  Recent Labs Lab 06/18/13 1240 06/19/13 0538 06/20/13 0415 06/21/13 0535  AST 12 12  --   --   ALT 10 11  --   --   ALKPHOS 79 75  --   --   BILITOT <0.1* 0.1*  --   --   PROT 5.5* 5.2*  --   --   ALBUMIN 1.3* 1.1* 1.1* 1.1*   No results found for this basename: LIPASE, AMYLASE,  in the last 168 hours No results found for this basename: AMMONIA,  in the last 168 hours CBC:  Recent Labs Lab 06/18/13 1240 06/18/13 2029 06/19/13 0538 06/21/13 0535  WBC 6.3 6.8 6.4 8.1  NEUTROABS 4.1  --   --   --   HGB 11.2* 11.2* 10.9* 10.7*  HCT 31.5* 31.5* 30.8* 29.5*  MCV 79.9 79.5 79.2 77.6*  PLT 270 270 261 269   Cardiac Enzymes: No results found for this basename: CKTOTAL, CKMB, CKMBINDEX, TROPONINI,  in the last  168 hours CBG:  Recent Labs Lab 06/20/13 1231 06/20/13 1703 06/20/13 2112 06/21/13 0742 06/21/13 1147  GLUCAP 243* 342* 283* 284* 256*    Iron Studies: No results found for this basename: IRON, TIBC, TRANSFERRIN, FERRITIN,  in the last 72 hours Studies/Results: [place] [facility]  06/20/2013   CLINICAL DATA:  62 year old male with acute onset of nephrotic syndrome. Percutaneous renal biopsy is requested. The patient has experienced significant edema and weight gain since the most recent prior ultrasound. Limited renal ultrasound is requested to evaluate for biopsy planning.  EXAM: LIMITED RENAL/URINARY TRACT ULTRASOUND COMPLETE  COMPARISON:  Prior renal ultrasound 06/10/2013  FINDINGS: Right Kidney  Length: 12.4 cm. Echogenic renal parenchyma. No hydronephrosis. No solid renal mass.  Left Kidney  Length: 12.7 cm. Echogenic renal parenchyma. No hydronephrosis. 3.6 x 3.1 cm simple cyst exophytic from the  upper pole of the kidney.  IMPRESSION: 1. The lower pole of the right kidney is approachable for percutaneous biopsy. 2. Echogenic renal parenchyma suggests underlying medical renal disease. 3. Negative for hydronephrosis.   Electronically Signed   By: Jacqulynn Cadet M.D.   On: 06/20/2013 10:31   . atorvastatin  80 mg Oral q1800  . cloNIDine  0.2 mg Oral BID  . fentaNYL      . furosemide  80 mg Intravenous TID  . [START ON 06/22/2013] heparin subcutaneous  5,000 Units Subcutaneous Q8H  . hydrochlorothiazide  12.5 mg Oral Daily  . insulin aspart  0-20 Units Subcutaneous TID WC  . insulin glargine  5 Units Subcutaneous Daily  . methylPREDNISolone (SOLU-MEDROL) injection  500 mg Intravenous Q24H  . midazolam      . sodium chloride  3 mL Intravenous Q12H    BMET    Component Value Date/Time   NA 135 06/21/2013 0535   K 3.7 06/21/2013 0535   CL 103 06/21/2013 0535   CO2 17* 06/21/2013 0535   GLUCOSE 311* 06/21/2013 0535   BUN 65* 06/21/2013 0535   CREATININE 7.02* 06/21/2013  0535   CALCIUM 7.4* 06/21/2013 0535   GFRNONAA 7* 06/21/2013 0535   GFRAA 9* 06/21/2013 0535   CBC    Component Value Date/Time   WBC 8.1 06/21/2013 0535   RBC 3.80* 06/21/2013 0535   HGB 10.7* 06/21/2013 0535   HCT 29.5* 06/21/2013 0535   PLT 269 06/21/2013 0535   MCV 77.6* 06/21/2013 0535   MCH 28.2 06/21/2013 0535   MCHC 36.3* 06/21/2013 0535   RDW 12.7 06/21/2013 0535   LYMPHSABS 1.6 06/18/2013 1240   MONOABS 0.4 06/18/2013 1240   EOSABS 0.2 06/18/2013 1240   BASOSABS 0.0 06/18/2013 1240   Assessment/Plan:  1. AKI/nephrotic syndrome. Nonoliguric with acute onset of weight gain and lower ext edema.  Pt with marked increase in Scr and have called his PCP but the nurse practitioner was not there.  His labs from 10/8 was 4.4, prior to that it was 3.82 on 10/6 , and on 03/22/13 was 1.27.  Uprot was 18411 on 10/11.   No prior UA on record.  This new info makes a subacute process possible.  Will await serologies and renal biopsy results.  Discussed that he may require HD if his Scr cont to worsen.  Cont with solumedrol 500mg  IV x 3 doses. 2. HTN- stable 3. DM- metformin on hold. Plan per primary svc 4. RA- agree with holding abatacept for now.   5. Hyperlipidemia due to NS- start statin per primary svc. 6. ACDz- follow h/h after biopsy  Gary Lang A

## 2013-06-21 NOTE — Progress Notes (Signed)
FMTS Attending Daily Note:  Gary Sabal MD  [phone] pager  Family Practice pager:  [phone] I have seen and examined this patient and have reviewed their chart. I have discussed this patient with the resident. I agree with the resident's findings, assessment and care plan.  Additionally:  Appears to be subacute new onset nephrotic process.  Greatly appreciate renal's help with this.  Biopsy today.  Gary Reasons, MD 06/21/2013 3:42 PM

## 2013-06-22 LAB — UIFE/LIGHT CHAINS/TP QN, 24-HR UR
Albumin, U: DETECTED
Albumin, U: DETECTED
Alpha 1, Urine: DETECTED — AB
Alpha 1, Urine: DETECTED — AB
Alpha 2, Urine: DETECTED — AB
Alpha 2, Urine: DETECTED — AB
Beta, Urine: DETECTED — AB
Free Kappa Lt Chains,Ur: 31.3 mg/dL — ABNORMAL HIGH (ref 0.14–2.42)
Free Kappa Lt Chains,Ur: 39.5 mg/dL — ABNORMAL HIGH (ref 0.14–2.42)
Free Kappa/Lambda Ratio: 3.41 ratio (ref 2.04–10.37)
Free Kappa/Lambda Ratio: 3.54 ratio (ref 2.04–10.37)
Free Lambda Excretion/Day: 185.64 mg/d
Free Lambda Lt Chains,Ur: 11.6 mg/dL — ABNORMAL HIGH (ref 0.02–0.67)
Free Lt Chn Excr Rate: 657.3 mg/d
Gamma Globulin, Urine: DETECTED — AB
Total Protein, Urine-Ur/day: 10618 mg/d — ABNORMAL HIGH (ref 10–140)
Total Protein, Urine: 505.6 mg/dL
Total Protein, Urine: 651.1 mg/dL
Volume, Urine: 2100 mL

## 2013-06-22 LAB — PROTEIN ELECTROPHORESIS, SERUM
Albumin ELP: 37.1 % — ABNORMAL LOW (ref 55.8–66.1)
Albumin ELP: 35.3 % — ABNORMAL LOW (ref 55.8–66.1)
Alpha-1-Globulin: 6.1 % — ABNORMAL HIGH (ref 2.9–4.9)
Alpha-2-Globulin: 25.5 % — ABNORMAL HIGH (ref 7.1–11.8)
Alpha-2-Globulin: 25.8 % — ABNORMAL HIGH (ref 7.1–11.8)
Beta 2: 10.3 % — ABNORMAL HIGH (ref 3.2–6.5)
Beta Globulin: 6.2 % (ref 4.7–7.2)
Beta Globulin: 6.1 % (ref 4.7–7.2)
Gamma Globulin: 14.8 % (ref 11.1–18.8)
M-Spike, %: NOT DETECTED g/dL
Total Protein ELP: 4.3 g/dL — ABNORMAL LOW (ref 6.0–8.3)
Total Protein ELP: 3.5 g/dL — ABNORMAL LOW (ref 6.0–8.3)

## 2013-06-22 LAB — RENAL FUNCTION PANEL
BUN: 84 mg/dL — ABNORMAL HIGH (ref 6–23)
CO2: 16 mEq/L — ABNORMAL LOW (ref 19–32)
Calcium: 7.4 mg/dL — ABNORMAL LOW (ref 8.4–10.5)
GFR calc Af Amer: 8 mL/min — ABNORMAL LOW (ref >90)
GFR calc non Af Amer: 7 mL/min — ABNORMAL LOW (ref >90)
Sodium: 138 mEq/L (ref 135–145)

## 2013-06-22 LAB — CBC
MCH: 28.4 pg (ref 26.0–34.0)
Platelets: 339 10*3/uL (ref 150–400)
RBC: 4.05 MIL/uL — ABNORMAL LOW (ref 4.22–5.81)
RDW: 12.8 % (ref 11.5–15.5)
WBC: 9.8 10*3/uL (ref 4.0–10.5)

## 2013-06-22 LAB — GLUCOSE, CAPILLARY
Glucose-Capillary: 144 mg/dL — ABNORMAL HIGH (ref 70–99)
Glucose-Capillary: 270 mg/dL — ABNORMAL HIGH (ref 70–99)

## 2013-06-22 MED ORDER — CALCIUM ACETATE 667 MG PO CAPS
667.0000 mg | ORAL_CAPSULE | Freq: Three times a day (TID) | ORAL | Status: DC
  Filled 2013-06-22 (×3): qty 1

## 2013-06-22 MED ORDER — CALCIUM ACETATE 667 MG PO CAPS
1334.0000 mg | ORAL_CAPSULE | Freq: Three times a day (TID) | ORAL | Status: DC
  Administered 2013-06-22 – 2013-06-25 (×9): 1334 mg via ORAL
  Filled 2013-06-22 (×12): qty 2

## 2013-06-22 MED ORDER — CALCIUM ACETATE 667 MG PO CAPS
667.0000 mg | ORAL_CAPSULE | Freq: Two times a day (BID) | ORAL | Status: DC
  Filled 2013-06-22 (×2): qty 1

## 2013-06-22 MED ORDER — SODIUM BICARBONATE 650 MG PO TABS
1300.0000 mg | ORAL_TABLET | Freq: Two times a day (BID) | ORAL | Status: DC
  Administered 2013-06-22 – 2013-06-27 (×12): 1300 mg via ORAL
  Filled 2013-06-22 (×14): qty 2

## 2013-06-22 NOTE — Progress Notes (Signed)
Family Medicine Teaching Service Daily Progress Note Intern Pager: [phone]  Patient name: Gary Lang Medical record number: [id number] Date of birth: 1951/02/01 Age: 62 y.o. Gender: male  Primary Care Provider: Jeri Lang Consultants: Nephrology Code Status: Full  Pt Overview and Major Events to Date:  10/18: IV Solumedrol started 10/19: 24hr urine with 12-18g proteinuria 10/20: Perc. renal Bx  Assessment and Plan: Gary Lang is a 62 y.o.admitted for extremities swelling and AKI. PMH is significant for rheumatoid arthritis, HTN, and T2DM.   1. Renal failure, acute. Concerning for rapidly progressive GN. Renal consultant appreciated.  - Creatinine continues to rise. 7.69. No symptoms of uremia. Decision regarding dialysis deferred to renal consultant.  - Percutaneous renal biopsy 1020, continuing IV steroids - Lasix 80mg  IV TID: Wt stable since admission (up 32lbs from baseline), Cr. rising steadily limiting lasix efficacy.   - Hep B, C and HIV serology negative - SPEP, UPEP, ANA, ANCA, C3, C4 results are pending.  - Holding abatacept, metformin and lisinopril.   2.  T2DM: A1c 9.4 on metformin alone - Hold metformin - SSI 24hr requirement increased on IV steroids (5 -> 15) in setting of high A1c.  - Adding lantus 5u daily, continuing resistant SSI. AM glucose 144. 51 u novolog yesterday.   3. CV: HTN. Holding lisinopril, continuing HCTZ.  - BP improved after addition of clonidine.   4. Rheumatoid arthritis: no recent flares and excellent control on abatacept.  - Holding abatacept 125mg  IM qMondays  FEN/GI: Renal diet Prophylaxis: Hep [place] TID  Disposition: Continuing IV steroid therapy and diuresis as well as diagnostic studies.   Subjective: Pt in good spirits today. Denies pain, denies dysuria, CP, HA, dizziness, reports edema has improved very slightly in both hands.    Objective: Temp:  [97.2 F (36.2 C)-97.9 F (36.6 C)] 97.9 F (36.6 C) (10/21  0935) Pulse Rate:  [56-68] 56 (10/21 0935) Resp:  [14-21] 18 (10/21 0935) BP: (109-148)/(61-76) 125/62 mmHg (10/21 0935) SpO2:  [8 %-100 %] 100 % (10/21 0935) Weight:  [251 lb 12.3 oz (114.202 kg)] 251 lb 12.3 oz (114.202 kg) (10/20 2044)  Physical Exam: General:  NAD Cardiovascular: RRR, II/VI systolic murmur Respiratory: Nonlabored, CTAB on room air Abdomen: NABS, mildly distended, non-tender to palpation  Extremities: diffuse 3+ pitting edema of UE and LE, 2+ pulses  Laboratory:  Recent Labs Lab 06/21/13 0535 06/21/13 1621 06/22/13 0440  WBC 8.1 8.8 9.8  HGB 10.7* 10.3* 11.5*  HCT 29.5* 29.1* 31.9*  PLT 269 264 339    Recent Labs Lab 06/18/13 1240  06/19/13 0538 06/20/13 0415 06/21/13 0535 06/22/13 0440  NA 140  --  138 137 135 138  K 3.3*  --  3.1* 3.6 3.7 3.9  CL 108  --  107 106 103 106  CO2 21  --  19 18* 17* 16*  BUN 50*  --  51* 56* 65* 84*  CREATININE 6.49*  < > 6.63* 6.65* 7.02* 7.69*  CALCIUM 7.5*  --  7.5* 7.7* 7.4* 7.4*  PROT 5.5*  --  5.2*  --   --   --   BILITOT <0.1*  --  0.1*  --   --   --   ALKPHOS 79  --  75  --   --   --   ALT 10  --  11  --   --   --   AST 12  --  12  --   --   --  GLUCOSE 125*  --  112* 303* 311* 161*  < > = values in this interval not displayed.  Imaging/Diagnostic Tests:  06/10/2013  IMPRESSION: Medical renal disease changes in both kidneys. 3.6 cm diameter left renal cyst. Vague hypoechoic area within left kidney, potentially artifact but difficult completely exclude mass. Consider non contrast MR imaging (non contrast due to renal failure) to exclude solid left renal mass lesion.   Gary Gather, MD 06/22/2013, 9:53 AM PGY-1, [place] [facility] Intern pager: [phone], text pages welcome

## 2013-06-22 NOTE — Progress Notes (Signed)
Patient ID: Kdyn Lecker, male   DOB: August 29, 1951, 62 y.o.   MRN: [id number] S:feels well O:BP 125/62  Pulse 56  Temp(Src) 97.9 F (36.6 C) (Oral)  Resp 18  Ht 6' 1.5" (1.867 m)  Wt 114.202 kg (251 lb 12.3 oz)  BMI 32.76 kg/m2  SpO2 100%  Intake/Output Summary (Last 24 hours) at 06/22/13 1130 Last data filed at 06/22/13 0936  Gross per 24 hour  Intake   1570 ml  Output    910 ml  Net    660 ml   Intake/Output: I/O last 3 completed shifts: In: [id number] [P.O.:1040; IV Piggyback:50] Out: 1525 [id number]  Intake/Output this shift:  Total I/O In: 480 [P.O.:480] Out: 110 [Urine:110] Weight change: 0.001 kg (0.1 oz) Gen:WD WN AAM n NAD CVS:no rub Resp:cta [id number] Ext:+2 edema   Recent Labs Lab 06/18/13 1240 06/18/13 2029 06/19/13 0538 06/20/13 0415 06/21/13 0535 06/22/13 0440  NA 140  --  138 137 135 138  K 3.3*  --  3.1* 3.6 3.7 3.9  CL 108  --  107 106 103 106  CO2 21  --  19 18* 17* 16*  GLUCOSE 125*  --  112* 303* 311* 161*  BUN 50*  --  51* 56* 65* 84*  CREATININE 6.49* 6.26* 6.63* 6.65* 7.02* 7.69*  ALBUMIN 1.3*  --  1.1* 1.1* 1.1* 1.2*  CALCIUM 7.5*  --  7.5* 7.7* 7.4* 7.4*  PHOS  --   --   --  6.9* 7.7* 8.7*  AST 12  --  12  --   --   --   ALT 10  --  11  --   --   --    Liver Function Tests:  Recent Labs Lab 06/18/13 1240 06/19/13 0538 06/20/13 0415 06/21/13 0535 06/22/13 0440  AST 12 12  --   --   --   ALT 10 11  --   --   --   ALKPHOS 79 75  --   --   --   BILITOT <0.1* 0.1*  --   --   --   PROT 5.5* 5.2*  --   --   --   ALBUMIN 1.3* 1.1* 1.1* 1.1* 1.2*   No results found for this basename: LIPASE, AMYLASE,  in the last 168 hours No results found for this basename: AMMONIA,  in the last 168 hours CBC:  Recent Labs Lab 06/18/13 1240 06/18/13 2029 06/19/13 0538 06/21/13 0535 06/21/13 1621 06/22/13 0440  WBC 6.3 6.8 6.4 8.1 8.8 9.8  NEUTROABS 4.1  --   --   --   --   --   HGB 11.2* 11.2* 10.9* 10.7* 10.3* 11.5*  HCT 31.5* 31.5*  30.8* 29.5* 29.1* 31.9*  MCV 79.9 79.5 79.2 77.6* 78.6 78.8  PLT 270 270 261 269 264 339   Cardiac Enzymes: No results found for this basename: CKTOTAL, CKMB, CKMBINDEX, TROPONINI,  in the last 168 hours CBG:  Recent Labs Lab 06/21/13 0742 06/21/13 1147 06/21/13 1644 06/21/13 2048 06/22/13 0717  GLUCAP 284* 256* 352* 412* 144*    Iron Studies: No results found for this basename: IRON, TIBC, TRANSFERRIN, FERRITIN,  in the last 72 hours Studies/Results: US Biopsy  06/21/2013   CLINICAL DATA:  Acute renal failure. The patient requires renal core biopsy for nephropathologic analysis.  EXAM: ULTRASOUND GUIDED CORE BIOPSY OF LEFT KIDNEY  MEDICATIONS: 4.0 mg IV Versed; 200 mcg IV Fentanyl  Total Moderate Sedation Time: 20 min.  PROCEDURE: The  procedure, risks, benefits, and alternatives were explained to the patient. Questions regarding the procedure were encouraged and answered. The patient understands and consents to the procedure. Initial ultrasound was used to localize the kidneys from a posterior approach. The left kidney was chosen for biopsy.  The left flank region was prepped with Betadine in a sterile fashion, and a sterile drape was applied covering the operative field. A sterile gown and sterile gloves were used for the procedure. Local anesthesia was provided with 1% Lidocaine.  Under ultrasound guidance, a 16 gauge core biopsy needle was advanced to the lower pole cortex of the left kidney. A total of 2 separate core biopsy samples were obtained. These were submitted in saline to Pathology.  COMPLICATIONS: None.  FINDINGS: Both kidneys were visible from a posterior flank approach. The lower pole cortex of the left kidney was felt to be slightly more visible than the right kidney. Solid and intact core biopsy samples were obtained. There was no immediate ultrasound evidence of a bleeding complication.  IMPRESSION: Ultrasound-guided core biopsy performed of the left kidney.   Electronically  Signed   By: Aletta Edouard M.D.   On: 06/21/2013 13:58   . atorvastatin  80 mg Oral q1800  . cloNIDine  0.2 mg Oral BID  . furosemide  80 mg Intravenous TID  . heparin subcutaneous  5,000 Units Subcutaneous Q8H  . hydrochlorothiazide  12.5 mg Oral Daily  . insulin aspart  0-20 Units Subcutaneous TID WC  . insulin glargine  5 Units Subcutaneous Daily  . sodium chloride  3 mL Intravenous Q12H    BMET    Component Value Date/Time   NA 138 06/22/2013 0440   K 3.9 06/22/2013 0440   CL 106 06/22/2013 0440   CO2 16* 06/22/2013 0440   GLUCOSE 161* 06/22/2013 0440   BUN 84* 06/22/2013 0440   CREATININE 7.69* 06/22/2013 0440   CALCIUM 7.4* 06/22/2013 0440   GFRNONAA 7* 06/22/2013 0440   GFRAA 8* 06/22/2013 0440   CBC    Component Value Date/Time   WBC 9.8 06/22/2013 0440   RBC 4.05* 06/22/2013 0440   HGB 11.5* 06/22/2013 0440   HCT 31.9* 06/22/2013 0440   PLT 339 06/22/2013 0440   MCV 78.8 06/22/2013 0440   MCH 28.4 06/22/2013 0440   MCHC 36.1* 06/22/2013 0440   RDW 12.8 06/22/2013 0440   LYMPHSABS 1.6 06/18/2013 1240   MONOABS 0.4 06/18/2013 1240   EOSABS 0.2 06/18/2013 1240   BASOSABS 0.0 06/18/2013 1240     Assessment/Plan:  1. AKI/nephrotic syndrome. Nonoliguric with acute onset of weight gain and lower ext edema. Pt with marked increase in Scr which rose from 1.27 in 03/22/13 to 3.82 on 06/07/13 and 4.4 on 06/09/13 and was 6.5 at time of admission.  Total Urine protein was 18411 on 10/11. No prior UA on record. This new info makes a subacute process possible. Will await serologies and renal biopsy (performed 06/21/13) results. Discussed that he may require HD if his Scr cont to worsen. Cont with solumedrol 500mg  IV x 3 doses. 1. Hopefully this is AKI related to ATN due to profound proteinuria and not an RPGN. Await prelim renal biopsy results in next 24-48 hours. 2. HTN- stable 3. Metabolic acidosis- due to #1. Will start NaHCO3 2 po bid and follow 4. Hyperphosphatemia-  will start binders, phoslo 667 2 qac tid and follow. 5. DM- metformin on hold. Plan per primary svc 6. RA- agree with holding abatacept for now.  7.  Hyperlipidemia due to NS- start statin per primary svc. 8. ACDz- follow h/h after biopsy 9.   [place] A

## 2013-06-22 NOTE — Plan of Care (Signed)
Problem: Food- and Nutrition-Related Knowledge Deficit (NB-1.1) Goal: Nutrition education Formal process to instruct or train a patient/client in a skill or to impart knowledge to help patients/clients voluntarily manage or modify food choices and eating behavior to maintain or improve health. Outcome: Completed/Met Date Met:  06/22/13  Nutrition Education Note  RD consulted for Renal Education. Provided Kidney Disease Food Pyramid to patient/family. Reviewed food groups and provided written recommended serving sizes specifically determined for patient's current nutritional status.   Explained why diet restrictions are needed and provided lists of foods to limit/avoid that are high potassium, sodium, and phosphorus. Provided specific recommendations on safer alternatives of these foods. Strongly encouraged compliance of this diet. We discussed his current phosphorus level and need for strict dietary compliance of a low phosphorus and low salt diet.  Teach back method used.  Expect good compliance.  Body mass index is 32.76 kg/(m^2). Pt meets criteria for Obese Class I based on current BMI.  Current diet order is Renal, patient is consuming approximately 100% of meals at this time. Labs and medications reviewed. No further nutrition interventions warranted at this time. RD contact information provided. If additional nutrition issues arise, please re-consult RD.  Inda Coke MS, RD, LDN Pager: [phone] After-hours pager: [phone]

## 2013-06-22 NOTE — Progress Notes (Signed)
S:  No complaints today, doing well  Exam: Gen:  Awake, alert, sitting on side of bed eating breakfast Heart:  RRR Lungs:  Clear Back:  Healing surgical biopsy entrance site Ext:  +1 edema BL  Imp/Plan: 1.  AKI -- appears to be nephrotic issue: - greatly appreciate renal's help with this issue - creatinine continues to rise.  Non-oliguric failure, electrolytes WNL currently.  Does not yet appear to need dialysis, will leave this to renal for decision - some serologies still pending.  Biopsy results likely later this week  2.  DM:   - gradual increase in insulin, noting that decrease needed as steroids tapered  Following other chronic medical issues.  Will sign resident note when completed.   [name], MD

## 2013-06-22 NOTE — Progress Notes (Signed)
FMTS Attending Daily Note:  Gary Sabal MD  [phone] pager  Family Practice pager:  [phone] I have seen and examined this patient and have reviewed their chart. I have discussed this patient with the resident. I agree with the resident's findings, assessment and care plan.  Additionally:  See my separate note for details.  [name], MD 06/22/2013 3:22 PM

## 2013-06-23 DIAGNOSIS — N049 Nephrotic syndrome with unspecified morphologic changes: Secondary | ICD-10-CM

## 2013-06-23 LAB — ANTISTREPTOLYSIN O TITER: ASO: <25 IU/mL (ref <409)

## 2013-06-23 LAB — GLUCOSE, CAPILLARY
Glucose-Capillary: 208 mg/dL — ABNORMAL HIGH (ref 70–99)
Glucose-Capillary: 211 mg/dL — ABNORMAL HIGH (ref 70–99)
Glucose-Capillary: 190 mg/dL — ABNORMAL HIGH (ref 70–99)

## 2013-06-23 LAB — RENAL FUNCTION PANEL
Albumin: 1 g/dL — ABNORMAL LOW (ref 3.5–5.2)
CO2: 15 mEq/L — ABNORMAL LOW (ref 19–32)
Calcium: 7.2 mg/dL — ABNORMAL LOW (ref 8.4–10.5)
Creatinine, Ser: 7.61 mg/dL — ABNORMAL HIGH (ref 0.50–1.35)
GFR calc Af Amer: 8 mL/min — ABNORMAL LOW (ref >90)
Glucose, Bld: 275 mg/dL — ABNORMAL HIGH (ref 70–99)
Phosphorus: 9.2 mg/dL — ABNORMAL HIGH (ref 2.3–4.6)
Sodium: 136 mEq/L (ref 135–145)

## 2013-06-23 LAB — C3 COMPLEMENT: C3 Complement: 199 mg/dL — ABNORMAL HIGH (ref 90–180)

## 2013-06-23 LAB — C4 COMPLEMENT: Complement C4, Body Fluid: 44 mg/dL — ABNORMAL HIGH (ref 10–40)

## 2013-06-23 MED ORDER — DOCUSATE SODIUM 50 MG/5ML PO LIQD
100.0000 mg | Freq: Two times a day (BID) | ORAL | Status: DC
  Administered 2013-06-23 – 2013-06-24 (×3): 100 mg via ORAL
  Filled 2013-06-23 (×8): qty 10

## 2013-06-23 MED ORDER — SENNA 8.6 MG PO TABS
2.0000 | ORAL_TABLET | Freq: Every day | ORAL | Status: DC | PRN
  Administered 2013-06-26: 17.2 mg via ORAL
  Filled 2013-06-23: qty 2

## 2013-06-23 NOTE — Progress Notes (Signed)
Came to visit Gary Lang at bedside to explain and discuss Link to Wellness program with [employer] for [employer] employees/dependents with [facility] insurance. Reports his wife works is Chief Executive Officer. Gary Lang reports he will be interested in the Wellness program and accepted Link to Wellness packet and application. Agreeable to post hospital discharge follow-up call. Appreciative of visit.  Marthenia Rolling, MSN- Ed, [facility] [phone]

## 2013-06-23 NOTE — Plan of Care (Signed)
Problem: Phase I Progression Outcomes Goal: Hemodynamically stable Outcome: Progressing Continues to receive Lasix 80 IV to assess UOP.  UOP remains decreased.  Creatine and BUN remain increased.  Generalized swelling observed, with bilateral LE edema present.  Awaiting renal biopsy results.  No acute events overnight.  Will continue to monitor patient condition.

## 2013-06-23 NOTE — Progress Notes (Signed)
Family Medicine Teaching Service Daily Progress Note Intern Pager: [phone]  Patient name: Gary Lang Medical record number: [id number] Date of birth: [birth year] Age: 62 y.o. Gender: male  Primary Care Provider: Jeri Modena Consultants: Nephrology Code Status: Full  Pt Overview and Major Events to Date:  10/18: IV Solumedrol started 10/19: 24hr urine with 12-18g proteinuria 10/20: Percutaneous renal Bx 10/22: Awaiting Bx results, continuing worsening renal indices  Assessment and Plan: Gary Lang is a 62 y.o.admitted for extremities swelling and AKI. PMH is significant for rheumatoid arthritis, HTN, and T2DM.   1. Renal failure Concerning for subacute nephrotic syndrome vs. rapidly progressive GN. Renal consultant appreciated.  - Creatinine stable >7. No symptoms of uremia. Decision regarding dialysis deferred to renal consultant.  - Anion gap metabolic acidosis worsening today, started bicarb 10/21 - Percutaneous renal biopsy 10/20, s/p IV steroids x3 days - Lasix 80mg  IV TID: Wt stable (up 1 kg) since admission (up 32lbs from dry weight) - Hep B, C and HIV serology negative - ASO titer neg; ANCA neg; Anti-GBM neg; ANA neg; C3/C4 elevated  - Holding abatacept, metformin and lisinopril.   2.  T2DM: A1c 9.4 on metformin alone - Hold metformin - SSI 24hr requirement increased s/p IV steroids 25 10/21) in setting of high A1c.  - Continue lantus low-dose, resistant SSI. Predict lowering of BG s/p steroids, and concerned about decreased insulin clearance  3. HTN. Holding lisinopril, continuing HCTZ.  - BP creeping up overnight 150s-160s/70s-80s.   4. Rheumatoid arthritis: no recent flares and excellent control on abatacept.  - Holding abatacept 125mg  IM qMondays  FEN/GI: Renal diet Prophylaxis: Hep Sidney TID  Disposition: Continuing IV diuresis, awaiting Bx result, following renal rec's  Subjective: Pt in good spirits today. Denies pain, denies dysuria, CP, HA,  dizziness, reports edema has improved very slightly in both hands.    Objective: Temp:  [97.5 F (36.4 C)-97.9 F (36.6 C)] 97.7 F (36.5 C) (10/22 0448) Pulse Rate:  [53-59] 55 (10/22 0448) Resp:  [18] 18 (10/22 0448) BP: (125-167)/(62-82) 152/78 mmHg (10/22 0448) SpO2:  [99 %-100 %] 99 % (10/22 0448) Weight:  [251 lb 12.3 oz (114.202 kg)] 251 lb 12.3 oz (114.202 kg) (10/21 2028)  Physical Exam: General: 62 yo male found sleeping in bed Cardiovascular: RRR, II/VI systolic murmur Respiratory: Nonlabored, CTAB on room air Abdomen: NABS, mildly distended, non-tender to palpation  Extremities: diffuse 3+ pitting edema of UE and LE, dependent R > L due to pt laying on R side, 2+ pulses  Laboratory:  Recent Labs Lab 06/21/13 0535 06/21/13 1621 06/22/13 0440  WBC 8.1 8.8 9.8  HGB 10.7* 10.3* 11.5*  HCT 29.5* 29.1* 31.9*  PLT 269 264 339    Recent Labs Lab 06/18/13 1240  06/19/13 0538  06/21/13 0535 06/22/13 0440 06/23/13 0425  NA 140  --  138  < > 135 138 136  K 3.3*  --  3.1*  < > 3.7 3.9 3.5  CL 108  --  107  < > 103 106 103  CO2 21  --  19  < > 17* 16* 15*  BUN 50*  --  51*  < > 65* 84* 94*  CREATININE 6.49*  < > 6.63*  < > 7.02* 7.69* 7.61*  CALCIUM 7.5*  --  7.5*  < > 7.4* 7.4* 7.2*  PROT 5.5*  --  5.2*  --   --   --   --   BILITOT <0.1*  --  0.1*  --   --   --   --  ALKPHOS 79  --  75  --   --   --   --   ALT 10  --  11  --   --   --   --   AST 12  --  12  --   --   --   --   GLUCOSE 125*  --  112*  < > 311* 161* 275*  < > = values in this interval not displayed.  Imaging/Diagnostic Tests:  06/10/2013  IMPRESSION: Medical renal disease changes in both kidneys. 3.6 cm diameter left renal cyst. Vague hypoechoic area within left kidney, potentially artifact but difficult completely exclude mass. Consider non contrast MR imaging (non contrast due to renal failure) to exclude solid left renal mass lesion.   Vance Gather, MD 06/23/2013, 8:29 AM PGY-1, [place] Intern pager: [phone], text pages welcome

## 2013-06-23 NOTE — Progress Notes (Signed)
FMTS Attending Daily Note:  Gary Sabal MD  [phone] pager  Family Practice pager:  [phone] I have seen and examined this patient and have reviewed their chart. I have discussed this patient with the resident. I agree with the resident's findings, assessment and care plan.  Awaiting biopsy results, hopefully tomorrow.    [name], MD 06/23/2013 1:28 PM

## 2013-06-23 NOTE — Progress Notes (Signed)
Patient ID: Gary Lang, male   DOB: 10/13/1950, 62 y.o.   MRN: [id number] S:feels well but c/o constipation O:BP 144/71  Pulse 60  Temp(Src) 98.5 F (36.9 C) (Oral)  Resp 18  Ht 6' 1.5" (1.867 m)  Wt 114.202 kg (251 lb 12.3 oz)  BMI 32.76 kg/m2  SpO2 99%  Intake/Output Summary (Last 24 hours) at 06/23/13 1141 Last data filed at 06/23/13 0956  Gross per 24 hour  Intake   1220 ml  Output   1350 ml  Net   -130 ml   Intake/Output: I/O last 3 completed shifts: In: 1820 [P.O.:1820] Out: 1710 [Urine:1710]  Intake/Output this shift:  Total I/O In: 240 [P.O.:240] Out: 250 [Urine:250] Weight change: 0 kg (0 lb) Gen:WD WN AAM in NAD CVS:no rub Resp:cta Abd:+BS, soft, NT Ext:2+edema   Recent Labs Lab 06/18/13 1240 06/18/13 2029 06/19/13 0538 06/20/13 0415 06/21/13 0535 06/22/13 0440 06/23/13 0425  NA 140  --  138 137 135 138 136  K 3.3*  --  3.1* 3.6 3.7 3.9 3.5  CL 108  --  107 106 103 106 103  CO2 21  --  19 18* 17* 16* 15*  GLUCOSE 125*  --  112* 303* 311* 161* 275*  BUN 50*  --  51* 56* 65* 84* 94*  CREATININE 6.49* 6.26* 6.63* 6.65* 7.02* 7.69* 7.61*  ALBUMIN 1.3*  --  1.1* 1.1* 1.1* 1.2* 1.0*  CALCIUM 7.5*  --  7.5* 7.7* 7.4* 7.4* 7.2*  PHOS  --   --   --  6.9* 7.7* 8.7* 9.2*  AST 12  --  12  --   --   --   --   ALT 10  --  11  --   --   --   --    Liver Function Tests:  Recent Labs Lab 06/18/13 1240 06/19/13 0538  06/21/13 0535 06/22/13 0440 06/23/13 0425  AST 12 12  --   --   --   --   ALT 10 11  --   --   --   --   ALKPHOS 79 75  --   --   --   --   BILITOT <0.1* 0.1*  --   --   --   --   PROT 5.5* 5.2*  --   --   --   --   ALBUMIN 1.3* 1.1*  < > 1.1* 1.2* 1.0*  < > = values in this interval not displayed. No results found for this basename: LIPASE, AMYLASE,  in the last 168 hours No results found for this basename: AMMONIA,  in the last 168 hours CBC:  Recent Labs Lab 06/18/13 1240 06/18/13 2029 06/19/13 0538 06/21/13 0535  06/21/13 1621 06/22/13 0440  WBC 6.3 6.8 6.4 8.1 8.8 9.8  NEUTROABS 4.1  --   --   --   --   --   HGB 11.2* 11.2* 10.9* 10.7* 10.3* 11.5*  HCT 31.5* 31.5* 30.8* 29.5* 29.1* 31.9*  MCV 79.9 79.5 79.2 77.6* 78.6 78.8  PLT 270 270 261 269 264 339   Cardiac Enzymes: No results found for this basename: CKTOTAL, CKMB, CKMBINDEX, TROPONINI,  in the last 168 hours CBG:  Recent Labs Lab 06/21/13 2048 06/22/13 0717 06/22/13 1122 06/22/13 1630 06/22/13 2033  GLUCAP 412* 144* 211* 255* 270*    Iron Studies: No results found for this basename: IRON, TIBC, TRANSFERRIN, FERRITIN,  in the last 72 hours Studies/Results: No results found. Marland Kitchen atorvastatin  80 mg Oral q1800  . calcium acetate  1,334 mg Oral TID WC  . cloNIDine  0.2 mg Oral BID  . furosemide  80 mg Intravenous TID  . heparin subcutaneous  5,000 Units Subcutaneous Q8H  . hydrochlorothiazide  12.5 mg Oral Daily  . insulin aspart  0-20 Units Subcutaneous TID WC  . insulin glargine  5 Units Subcutaneous Daily  . sodium bicarbonate  1,300 mg Oral BID  . sodium chloride  3 mL Intravenous Q12H    BMET    Component Value Date/Time   NA 136 06/23/2013 0425   K 3.5 06/23/2013 0425   CL 103 06/23/2013 0425   CO2 15* 06/23/2013 0425   GLUCOSE 275* 06/23/2013 0425   BUN 94* 06/23/2013 0425   CREATININE 7.61* 06/23/2013 0425   CALCIUM 7.2* 06/23/2013 0425   GFRNONAA 7* 06/23/2013 0425   GFRAA 8* 06/23/2013 0425   CBC    Component Value Date/Time   WBC 9.8 06/22/2013 0440   RBC 4.05* 06/22/2013 0440   HGB 11.5* 06/22/2013 0440   HCT 31.9* 06/22/2013 0440   PLT 339 06/22/2013 0440   MCV 78.8 06/22/2013 0440   MCH 28.4 06/22/2013 0440   MCHC 36.1* 06/22/2013 0440   RDW 12.8 06/22/2013 0440   LYMPHSABS 1.6 06/18/2013 1240   MONOABS 0.4 06/18/2013 1240   EOSABS 0.2 06/18/2013 1240   BASOSABS 0.0 06/18/2013 1240     Assessment/Plan:  1. AKI/nephrotic syndrome. Slight improvement of Scr.  Awaiting serologies and renal  biopsy results (performed 06/21/13). Discussed that he may require HD if his Scr cont to worsen. Cont with solumedrol 500mg  IV x 3 doses.  1. Hopefully this is AKI related to ATN due to profound proteinuria and not an RPGN. Await prelim renal biopsy results in next 24 hours. 2. HTN- stable 3. Metabolic acidosis- due to #1. Started NaHCO3 2 po bid and follow 4. Hyperphosphatemia- will start binders, phoslo 667 2 qac tid and follow. 5. DM- metformin on hold. Plan per primary svc 6. RA- agree with holding abatacept for now.  7. Hyperlipidemia due to NS- start statin per primary svc. 8. ACDz- follow h/h after biopsy    Annora Guderian A

## 2013-06-24 LAB — GLUCOSE, CAPILLARY
Glucose-Capillary: 205 mg/dL — ABNORMAL HIGH (ref 70–99)
Glucose-Capillary: 250 mg/dL — ABNORMAL HIGH (ref 70–99)

## 2013-06-24 LAB — RENAL FUNCTION PANEL
Albumin: 1 g/dL — ABNORMAL LOW (ref 3.5–5.2)
Calcium: 7.3 mg/dL — ABNORMAL LOW (ref 8.4–10.5)
GFR calc Af Amer: 8 mL/min — ABNORMAL LOW (ref >90)
GFR calc non Af Amer: 7 mL/min — ABNORMAL LOW (ref >90)
Glucose, Bld: 197 mg/dL — ABNORMAL HIGH (ref 70–99)
Phosphorus: 9.3 mg/dL — ABNORMAL HIGH (ref 2.3–4.6)
Potassium: 3.5 mEq/L (ref 3.5–5.1)
Sodium: 135 mEq/L (ref 135–145)

## 2013-06-24 MED ORDER — PREDNISONE 50 MG PO TABS
60.0000 mg | ORAL_TABLET | Freq: Every day | ORAL | Status: DC
  Administered 2013-06-24 – 2013-07-01 (×7): 60 mg via ORAL
  Filled 2013-06-24 (×9): qty 1

## 2013-06-24 MED ORDER — DILTIAZEM HCL ER COATED BEADS 180 MG PO CP24
180.0000 mg | ORAL_CAPSULE | Freq: Every day | ORAL | Status: DC
  Administered 2013-06-24 – 2013-07-01 (×7): 180 mg via ORAL
  Filled 2013-06-24 (×8): qty 1

## 2013-06-24 MED ORDER — FUROSEMIDE 10 MG/ML IJ SOLN
100.0000 mg | Freq: Three times a day (TID) | INTRAVENOUS | Status: DC
  Administered 2013-06-24 – 2013-06-27 (×11): 100 mg via INTRAVENOUS
  Filled 2013-06-24 (×14): qty 10

## 2013-06-24 NOTE — Progress Notes (Signed)
Patient ID: Gary Lang, male   DOB: [birth year], 62 y.o.   MRN: [id number] S:no new complaints, feels well. O:BP 145/76  Pulse 71  Temp(Src) 98.2 F (36.8 C) (Oral)  Resp 18  Ht 6' 1.5" (1.867 m)  Wt 114.193 kg (251 lb 12 oz)  BMI 32.76 kg/m2  SpO2 98%  Intake/Output Summary (Last 24 hours) at 06/24/13 1148 Last data filed at 06/24/13 0609  Gross per 24 hour  Intake    960 ml  Output   1200 ml  Net   -240 ml   Intake/Output: I/O last 3 completed shifts: In: 1920 [P.O.:1920] Out: 2451 [Urine:2450; Stool:1]  Intake/Output this shift:    Weight change: -0.009 kg (-0.3 oz) Gen:WD WN AAM in NAD CVS:no rub Resp:decreased BS  [id number] Ext:2+edema   Recent Labs Lab 06/18/13 1240 06/18/13 2029 06/19/13 0538 06/20/13 0415 06/21/13 0535 06/22/13 0440 06/23/13 0425 06/24/13 0500  NA 140  --  138 137 135 138 136 135  K 3.3*  --  3.1* 3.6 3.7 3.9 3.5 3.5  CL 108  --  107 106 103 106 103 101  CO2 21  --  19 18* 17* 16* 15* 18*  GLUCOSE 125*  --  112* 303* 311* 161* 275* 197*  BUN 50*  --  51* 56* 65* 84* 94* 102*  CREATININE 6.49* 6.26* 6.63* 6.65* 7.02* 7.69* 7.61* 7.67*  ALBUMIN 1.3*  --  1.1* 1.1* 1.1* 1.2* 1.0* 1.0*  CALCIUM 7.5*  --  7.5* 7.7* 7.4* 7.4* 7.2* 7.3*  PHOS  --   --   --  6.9* 7.7* 8.7* 9.2* 9.3*  AST 12  --  12  --   --   --   --   --   ALT 10  --  11  --   --   --   --   --    Liver Function Tests:  Recent Labs Lab 06/18/13 1240 06/19/13 0538  06/22/13 0440 06/23/13 0425 06/24/13 0500  AST 12 12  --   --   --   --   ALT 10 11  --   --   --   --   ALKPHOS 79 75  --   --   --   --   BILITOT <0.1* 0.1*  --   --   --   --   PROT 5.5* 5.2*  --   --   --   --   ALBUMIN 1.3* 1.1*  < > 1.2* 1.0* 1.0*  < > = values in this interval not displayed. No results found for this basename: LIPASE, AMYLASE,  in the last 168 hours No results found for this basename: AMMONIA,  in the last 168 hours CBC:  Recent Labs Lab 06/18/13 1240 06/18/13 2029  06/19/13 0538 06/21/13 0535 06/21/13 1621 06/22/13 0440  WBC 6.3 6.8 6.4 8.1 8.8 9.8  NEUTROABS 4.1  --   --   --   --   --   HGB 11.2* 11.2* 10.9* 10.7* 10.3* 11.5*  HCT 31.5* 31.5* 30.8* 29.5* 29.1* 31.9*  MCV 79.9 79.5 79.2 77.6* 78.6 78.8  PLT 270 270 261 269 264 339   Cardiac Enzymes: No results found for this basename: CKTOTAL, CKMB, CKMBINDEX, TROPONINI,  in the last 168 hours CBG:  Recent Labs Lab 06/23/13 0821 06/23/13 1146 06/23/13 1653 06/23/13 2027 06/24/13 0724  GLUCAP 208* 249* 211* 190* 161*    Iron Studies: No results found for this basename: IRON, TIBC,  TRANSFERRIN, FERRITIN,  in the last 72 hours Studies/Results: No results found. Marland Kitchen atorvastatin  80 mg Oral q1800  . calcium acetate  1,334 mg Oral TID WC  . cloNIDine  0.2 mg Oral BID  . docusate  100 mg Oral BID  . furosemide  80 mg Intravenous TID  . heparin subcutaneous  5,000 Units Subcutaneous Q8H  . insulin aspart  0-20 Units Subcutaneous TID WC  . insulin glargine  5 Units Subcutaneous Daily  . sodium bicarbonate  1,300 mg Oral BID  . sodium chloride  3 mL Intravenous Q12H    BMET    Component Value Date/Time   NA 135 06/24/2013 0500   K 3.5 06/24/2013 0500   CL 101 06/24/2013 0500   CO2 18* 06/24/2013 0500   GLUCOSE 197* 06/24/2013 0500   BUN 102* 06/24/2013 0500   CREATININE 7.67* 06/24/2013 0500   CALCIUM 7.3* 06/24/2013 0500   GFRNONAA 7* 06/24/2013 0500   GFRAA 8* 06/24/2013 0500   CBC    Component Value Date/Time   WBC 9.8 06/22/2013 0440   RBC 4.05* 06/22/2013 0440   HGB 11.5* 06/22/2013 0440   HCT 31.9* 06/22/2013 0440   PLT 339 06/22/2013 0440   MCV 78.8 06/22/2013 0440   MCH 28.4 06/22/2013 0440   MCHC 36.1* 06/22/2013 0440   RDW 12.8 06/22/2013 0440   LYMPHSABS 1.6 06/18/2013 1240   MONOABS 0.4 06/18/2013 1240   EOSABS 0.2 06/18/2013 1240   BASOSABS 0.0 06/18/2013 1240     Assessment/Plan:  1. AKI/nephrotic syndrome. 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  Will place him on prednisone 34mq daily.  We did discuss that historically, only 10% of patients respond to therapy and given his advanced degree of kidney disease at presentation, he will likely require RRT sometime in the future despite therapy.  However, he is optimistic and is willing to proceed with po prednisone and education regarding HD.    1. Scr has plateued and hopefully this is AKI related to ATN due to profound proteinuria and will start to improve. 2. HTN- stable but will add dilt cd given massive proteinuria and inability to add ace/arb 3. Metabolic acidosis- due to #1. Started NaHCO3 2 po bid and follow 4. Hyperphosphatemia- will start binders, phoslo 667 2 qac tid and follow. 5. DM- metformin on hold. Plan per primary svc 6. RA- agree with holding abatacept for now.  7. Hyperlipidemia due to NS- start statin per primary svc. 8. ACDz- follow h/h after biopsy 9. Volume overload- increase lasix and follow. [place] A

## 2013-06-24 NOTE — Progress Notes (Signed)
Family Medicine Teaching Service Daily Progress Note Intern Pager: [phone]  Patient name: Gary Lang Medical record number: [id number] Date of birth: 1951/07/14 Age: 62 y.o. Gender: male  Primary Care Provider: Jeri Modena Consultants: Nephrology Code Status: Full  Pt Overview and Major Events to Date:  10/18: IV Solumedrol started 10/19: 24hr urine with 12-18g proteinuria 10/20: Percutaneous renal Bx 10/22: Awaiting Bx results, continuing worsening renal indices  Assessment and Plan: Gary Lang is a 62 y.o.admitted for extremities swelling and AKI. PMH is significant for rheumatoid arthritis, HTN, and T2DM.   # AKI/nephrotic syndrome: Concerning for subacute nephrotic syndrome vs. rapidly progressive GN. Pathology should return today. Will determine course of treatment.   - Lasix 80mg  IV TID: Wt stable (up 1 kg) since admission (up 32lbs from dry weight) -  C3/C4 elevated other serology is negative.  - Holding abatacept, metformin and lisinopril.  - Renal rec's: Hopefully this is AKI related to ATN due to profound proteinuria and not an RPGN. Await prelim renal biopsy results today.  #Metabolic acidosis: due to above.  - bicarb was started 10/21  - continue to monitor.   #Hyperphosphatemia: binders started  - Phoslo 667 2 qac TID  - continue to monitor.   # T2DM: A1c 9.4 on metformin alone.  - Hold metformin - SSI 24hr requirement increased s/p IV steroids 25 10/21) in setting of high A1c.  - Continue lantus low-dose, resistant SSI. Predict lowering of BG s/p steroids, and concerned about decreased insulin clearance  #HTN. Holding lisinopril, continuing HCTZ.  - BP creeping up overnight 150s-160s/70s-80s.   #Rheumatoid arthritis: no recent flares and excellent control on abatacept.  - Holding abatacept 125mg  IM qMondays  #Anemia of Chronic Disease: will to continue to follow after biopsy  - Hgb trending up.   FEN/GI: Renal diet Prophylaxis: Hep [place]  TID  Disposition: Continuing IV diuresis, awaiting Bx result, following renal rec's  Subjective: Patient feeling well this morning with no complaints. Wife wants FMLA filled out.   Objective: Temp:  [97.8 F (36.6 C)-98.2 F (36.8 C)] 98.2 F (36.8 C) (10/23 0834) Pulse Rate:  [60-77] 71 (10/23 0834) Resp:  [18] 18 (10/23 0834) BP: (137-145)/(72-80) 145/76 mmHg (10/23 0834) SpO2:  [98 %-100 %] 98 % (10/23 0834) Weight:  [251 lb 12 oz (114.193 kg)] 251 lb 12 oz (114.193 kg) (10/22 2024)  Filed Weights   06/21/13 2044 06/22/13 2028 06/23/13 2024  Weight: 251 lb 12.3 oz (114.202 kg) 251 lb 12.3 oz (114.202 kg) 251 lb 12 oz (114.193 kg)   Physical Exam: General: 62 yo male found sleeping in bed Cardiovascular: RRR, II/VI systolic murmur Respiratory: Nonlabored, CTAB on room air Abdomen: NABS, mildly distended, non-tender to palpation  Extremities: diffuse 3+ pitting edema of UE and LE, dependent R > L due to pt laying on R side, 2+ pulses  Laboratory:  Recent Labs Lab 06/21/13 0535 06/21/13 1621 06/22/13 0440  WBC 8.1 8.8 9.8  HGB 10.7* 10.3* 11.5*  HCT 29.5* 29.1* 31.9*  PLT 269 264 339    Recent Labs Lab 06/18/13 1240  06/19/13 0538  06/22/13 0440 06/23/13 0425 06/24/13 0500  NA 140  --  138  < > 138 136 135  K 3.3*  --  3.1*  < > 3.9 3.5 3.5  CL 108  --  107  < > 106 103 101  CO2 21  --  19  < > 16* 15* 18*  BUN 50*  --  51*  < >  84* 94* 102*  CREATININE 6.49*  < > 6.63*  < > 7.69* 7.61* 7.67*  CALCIUM 7.5*  --  7.5*  < > 7.4* 7.2* 7.3*  PROT 5.5*  --  5.2*  --   --   --   --   BILITOT <0.1*  --  0.1*  --   --   --   --   ALKPHOS 79  --  75  --   --   --   --   ALT 10  --  11  --   --   --   --   AST 12  --  12  --   --   --   --   GLUCOSE 125*  --  112*  < > 161* 275* 197*  < > = values in this interval not displayed.   Recent Labs Lab 06/23/13 0821 06/23/13 1146 06/23/13 1653 06/23/13 2027 06/24/13 0724  GLUCAP 208* 249* 211* 190* 161*     Imaging/Diagnostic Tests:  06/10/2013  IMPRESSION: Medical renal disease changes in both kidneys. 3.6 cm diameter left renal cyst. Vague hypoechoic area within left kidney, potentially artifact but difficult completely exclude mass. Consider non contrast MR imaging (non contrast due to renal failure) to exclude solid left renal mass lesion.   Clearance Coots, MD 06/24/2013, 9:58 AM PGY-1, [place] Intern pager: [phone], text pages welcome

## 2013-06-24 NOTE — Progress Notes (Addendum)
FMTS Attending Daily Note:  [name] MD  [phone] pager  Family Practice pager:  [phone] I have seen and examined this patient and have reviewed their chart. I have discussed this patient with the resident. I agree with the resident's findings, assessment and care plan.  Additionally:  Patient stable, no concerns.  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  Not good prognosis per renal.  Continuing steroids, watching renal function.  Greatly appreciate renal help in this patient.   [name], MD 06/24/2013 4:58 PM

## 2013-06-25 DIAGNOSIS — N179 Acute kidney failure, unspecified: Secondary | ICD-10-CM

## 2013-06-25 DIAGNOSIS — Z01810 Encounter for preprocedural cardiovascular examination: Secondary | ICD-10-CM

## 2013-06-25 DIAGNOSIS — N032 Chronic nephritic syndrome with diffuse membranous glomerulonephritis: Secondary | ICD-10-CM

## 2013-06-25 LAB — GLUCOSE, CAPILLARY
Glucose-Capillary: 255 mg/dL — ABNORMAL HIGH (ref 70–99)
Glucose-Capillary: 257 mg/dL — ABNORMAL HIGH (ref 70–99)
Glucose-Capillary: 262 mg/dL — ABNORMAL HIGH (ref 70–99)
Glucose-Capillary: 260 mg/dL — ABNORMAL HIGH (ref 70–99)

## 2013-06-25 LAB — RENAL FUNCTION PANEL
Albumin: 0.9 g/dL — ABNORMAL LOW (ref 3.5–5.2)
CO2: 13 mEq/L — ABNORMAL LOW (ref 19–32)
Calcium: 7.2 mg/dL — ABNORMAL LOW (ref 8.4–10.5)
GFR calc Af Amer: 8 mL/min — ABNORMAL LOW (ref >90)
GFR calc non Af Amer: 7 mL/min — ABNORMAL LOW (ref >90)
Glucose, Bld: 247 mg/dL — ABNORMAL HIGH (ref 70–99)
Phosphorus: 9.7 mg/dL — ABNORMAL HIGH (ref 2.3–4.6)
Sodium: 131 mEq/L — ABNORMAL LOW (ref 135–145)

## 2013-06-25 MED ORDER — CALCIUM ACETATE 667 MG PO CAPS
2001.0000 mg | ORAL_CAPSULE | Freq: Three times a day (TID) | ORAL | Status: DC
  Administered 2013-06-25 – 2013-07-01 (×14): 2001 mg via ORAL
  Filled 2013-06-25 (×21): qty 3

## 2013-06-25 MED ORDER — ALBUMIN HUMAN 5 % IV SOLN
12.5000 g | Freq: Once | INTRAVENOUS | Status: AC
  Administered 2013-06-25: 12.5 g via INTRAVENOUS
  Filled 2013-06-25: qty 250

## 2013-06-25 MED ORDER — METOLAZONE 2.5 MG PO TABS
2.5000 mg | ORAL_TABLET | Freq: Every day | ORAL | Status: DC
  Administered 2013-06-25 – 2013-06-27 (×3): 2.5 mg via ORAL
  Filled 2013-06-25 (×4): qty 1

## 2013-06-25 MED ORDER — INSULIN GLARGINE 100 UNIT/ML ~~LOC~~ SOLN
5.0000 [IU] | Freq: Once | SUBCUTANEOUS | Status: AC
  Administered 2013-06-25: 5 [IU] via SUBCUTANEOUS
  Filled 2013-06-25: qty 0.05

## 2013-06-25 MED ORDER — PANTOPRAZOLE SODIUM 20 MG PO TBEC
20.0000 mg | DELAYED_RELEASE_TABLET | Freq: Every day | ORAL | Status: DC
  Administered 2013-06-25 – 2013-07-01 (×6): 20 mg via ORAL
  Filled 2013-06-25 (×7): qty 1

## 2013-06-25 MED ORDER — INSULIN GLARGINE 100 UNIT/ML ~~LOC~~ SOLN
10.0000 [IU] | Freq: Every day | SUBCUTANEOUS | Status: DC
  Filled 2013-06-25: qty 0.1

## 2013-06-25 NOTE — Progress Notes (Signed)
Right  Upper Extremity Vein Map    Cephalic  Segment Diameter Depth Comment  1. Axilla 5.77mm mm   2. Mid upper arm 5.21mm mm   3. Above AC 5.37mm mm   4. In AC 5.57mm mm   5. Below AC 3.18mm mm   6. Mid forearm 3.85mm mm   7. Wrist 3.35mm mm    mm mm    mm mm    mm mm      Left Upper Extremity Vein Map    Cephalic  Segment Diameter Depth Comment  1. Axilla 4.37mm mm   2. Mid upper arm 3.72mm mm   3. Above [facility] 3.6mm mm   4. In [facility] 4.52mm mm   5. Below AC 5.67mm mm   6. Mid forearm 3.12mm mm   7. Wrist 2.49mm mm    mm mm    mm mm    mm mm     06/25/2013 2:34 PM Allesha Aronoff, RVT, RDCS, RDMS

## 2013-06-25 NOTE — Progress Notes (Signed)
Patient ID: Demaree Hosking, male   DOB: 06/19/1951, 62 y.o.   MRN: [id number] S:feels well, no c/o O:BP 138/74  Pulse 62  Temp(Src) 98.2 F (36.8 C) (Oral)  Resp 20  Ht 6' 1.5" (1.867 m)  Wt 114.851 kg (253 lb 3.2 oz)  BMI 32.95 kg/m2  SpO2 94%  Intake/Output Summary (Last 24 hours) at 06/25/13 0910 Last data filed at 06/25/13 0826  Gross per 24 hour  Intake    725 ml  Output    900 ml  Net   -175 ml   Intake/Output: I/O last 3 completed shifts: In: [id number] [P.O.:1320; Other:5; IV Piggyback:120] Out: 1300 [Urine:1300]  Intake/Output this shift:  Total I/O In: -  Out: 200 [Urine:200] Weight change: 0.658 kg (1 lb 7.2 oz) Gen:WD WN AAM in NAD CVS:no rub Resp:decreased BS at bases [id number] Ext:2+ edema   Recent Labs Lab 06/18/13 1240  06/19/13 0538 06/20/13 0415 06/21/13 0535 06/22/13 0440 06/23/13 0425 06/24/13 0500 06/25/13 0626  NA 140  --  138 137 135 138 136 135 131*  K 3.3*  --  3.1* 3.6 3.7 3.9 3.5 3.5 4.1  CL 108  --  107 106 103 106 103 101 98  CO2 21  --  19 18* 17* 16* 15* 18* 13*  GLUCOSE 125*  --  112* 303* 311* 161* 275* 197* 247*  BUN 50*  --  51* 56* 65* 84* 94* 102* 107*  CREATININE 6.49*  < > 6.63* 6.65* 7.02* 7.69* 7.61* 7.67* 7.41*  ALBUMIN 1.3*  --  1.1* 1.1* 1.1* 1.2* 1.0* 1.0* 0.9*  CALCIUM 7.5*  --  7.5* 7.7* 7.4* 7.4* 7.2* 7.3* 7.2*  PHOS  --   --   --  6.9* 7.7* 8.7* 9.2* 9.3* 9.7*  AST 12  --  12  --   --   --   --   --   --   ALT 10  --  11  --   --   --   --   --   --   < > = values in this interval not displayed. Liver Function Tests:  Recent Labs Lab 06/18/13 1240 06/19/13 0538  06/23/13 0425 06/24/13 0500 06/25/13 0626  AST 12 12  --   --   --   --   ALT 10 11  --   --   --   --   ALKPHOS 79 75  --   --   --   --   BILITOT <0.1* 0.1*  --   --   --   --   PROT 5.5* 5.2*  --   --   --   --   ALBUMIN 1.3* 1.1*  < > 1.0* 1.0* 0.9*  < > = values in this interval not displayed. No results found for this basename: LIPASE,  AMYLASE,  in the last 168 hours No results found for this basename: AMMONIA,  in the last 168 hours CBC:  Recent Labs Lab 06/18/13 1240 06/18/13 2029 06/19/13 0538 06/21/13 0535 06/21/13 1621 06/22/13 0440  WBC 6.3 6.8 6.4 8.1 8.8 9.8  NEUTROABS 4.1  --   --   --   --   --   HGB 11.2* 11.2* 10.9* 10.7* 10.3* 11.5*  HCT 31.5* 31.5* 30.8* 29.5* 29.1* 31.9*  MCV 79.9 79.5 79.2 77.6* 78.6 78.8  PLT 270 270 261 269 264 339   Cardiac Enzymes: No results found for this basename: CKTOTAL, CKMB, CKMBINDEX, TROPONINI,  in  the last 168 hours CBG:  Recent Labs Lab 06/24/13 1137 06/24/13 1659 06/24/13 2138 06/25/13 0635 06/25/13 0722  GLUCAP 205* 231* 250* 255* 257*    Iron Studies: No results found for this basename: IRON, TIBC, TRANSFERRIN, FERRITIN,  in the last 72 hours Studies/Results: No results found. Marland Kitchen atorvastatin  80 mg Oral q1800  . calcium acetate  2,001 mg Oral TID WC  . cloNIDine  0.2 mg Oral BID  . diltiazem  180 mg Oral Daily  . docusate  100 mg Oral BID  . furosemide  100 mg Intravenous TID  . heparin subcutaneous  5,000 Units Subcutaneous Q8H  . insulin aspart  0-20 Units Subcutaneous TID WC  . insulin glargine  5 Units Subcutaneous Daily  . pantoprazole  20 mg Oral Daily  . predniSONE  60 mg Oral Q breakfast  . sodium bicarbonate  1,300 mg Oral BID  . sodium chloride  3 mL Intravenous Q12H    BMET    Component Value Date/Time   NA 131* 06/25/2013 0626   K 4.1 06/25/2013 0626   CL 98 06/25/2013 0626   CO2 13* 06/25/2013 0626   GLUCOSE 247* 06/25/2013 0626   BUN 107* 06/25/2013 0626   CREATININE 7.41* 06/25/2013 0626   CALCIUM 7.2* 06/25/2013 0626   GFRNONAA 7* 06/25/2013 0626   GFRAA 8* 06/25/2013 0626   CBC    Component Value Date/Time   WBC 9.8 06/22/2013 0440   RBC 4.05* 06/22/2013 0440   HGB 11.5* 06/22/2013 0440   HCT 31.9* 06/22/2013 0440   PLT 339 06/22/2013 0440   MCV 78.8 06/22/2013 0440   MCH 28.4 06/22/2013 0440   MCHC 36.1*  06/22/2013 0440   RDW 12.8 06/22/2013 0440   LYMPHSABS 1.6 06/18/2013 1240   MONOABS 0.4 06/18/2013 1240   EOSABS 0.2 06/18/2013 1240   BASOSABS 0.0 06/18/2013 1240     Assessment/Plan:  1. AKI/nephrotic syndrome. Slight improvement of Scr. Serologies negative and renal biopsy results (performed 06/21/13) c/w collapsing FSGS.  SCr starting to improve.   1. Discussed natural history of collapsing FSG and poor response rate.  2. We also discussed that he may require HD if his anasarca and/or Scr cont to worsen.  3. Started on prednisone 60mg  qd  4. Change diuretic to po Lasix 120 bid and add metolazone 2.5mg  qam to help with diuresis.  would not discharge him unless he was responding to po diuretics first. 5. He also had a component of ATN which should hopefully improve with time. 2. HTN- stable, now on dilt to help with proteinuria and HTN 3. GI prophylaxis- start on protonix 20mg  qd d/t pred 4. Hypoalbuminemia- d/t Nephrotic syndrome.  Would recommend IV salt-poor albumin to help with diuresis. 5. Metabolic acidosis- due to #1. Started NaHCO3 2 po bid and follow  6. Hyperphosphatemia- will increase binders, phoslo 667 to 3 qac tid and follow. 7. DM- metformin on hold. Plan per primary svc 8. RA- agree with holding abatacept for now.  9. Hyperlipidemia due to NS- start statin per primary svc. 32. ACDz- follow h/h after biopsy 11. Dispo- still markedly volume overloaded and has met acidosis and azotemia.  Will need to see improvement of volume status with po diuretics as well as electrolyte abnormalities prior to d/c to home.    [place] A

## 2013-06-25 NOTE — Progress Notes (Signed)
Inpatient Diabetes Program Recommendations  AACE/ADA: New Consensus Statement on Inpatient Glycemic Control (2013)  Target Ranges:  Prepandial:   less than 140 mg/dL      Peak postprandial:   less than 180 mg/dL (1-2 hours)      Critically ill patients:  140 - 180 mg/dL   Reason for Visit: Hyperglycemia  Results for Gary Lang, Gary Lang (MRN [id number]) as of 06/25/2013 10:27  Ref. Range 06/24/2013 16:59 06/24/2013 21:38 06/25/2013 06:26 06/25/2013 06:35 06/25/2013 07:22  Glucose-Capillary Latest Range: 70-99 mg/dL 231 (H) 250 (H)  255 (H) 257 (H)    Inpatient Diabetes Program Recommendations Insulin - Basal: Increase Lantus to 10 units QD Correction (SSI): Consider reducing Novolog to sensitive tidwc (with addition of meal coverage insulin) Insulin - Meal Coverage: Add Novolog 3 units tidwc for meal coverage insulin if pt eats >50% meal  Note: If pt is to go home on insulin, will need to order insulin starter kit and RN to begin teaching insulin administration.  Thank you. Lorenda Peck, RD, LDN, CDE Inpatient Diabetes Coordinator [phone]

## 2013-06-25 NOTE — Consult Note (Signed)
Vascular and Vein Specialist of [facility]  Patient name: Gary Lang MRN: [id number] DOB: 05/22/1951 Sex: male  REASON FOR CONSULT: May need for hemodialysis access. Consult from Dr. Bonner Puna.   HPI: Gary Lang is a 62 y.o. male who was admitted on 06/18/2013 with arm and leg swelling. The patient was admitted with acute renal failure. The patient had developed the fairly acute onset of edema with a 30 pound weight gain over the last several weeks. He was evaluated by the nephrologist and felt to have acute renal failure probably secondary to nephrotic syndrome.  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 He is being treated with steroids.   I discussed the case with Dr. Marval Regal. He would like [place] to plan on placement of an AV fistula on Monday. If his kidney function improved significantly then made tensely cancel the case. If his kidney function worsens he may require additionally the placement of a dialysis catheter.  He has had progressive swelling and generalized edema. He denies any anorexia, nausea, fatigue, or palpitations.  Past Medical History  Diagnosis Date  . Hypertension   . Diabetes mellitus without complication   . Rheumatoid arthritis    Family History  Problem Relation Age of Onset  . Cancer Mother    SOCIAL HISTORY: History  Substance Use Topics  . Smoking status: Former Smoker -- 0.50 packs/day for 10 years    Types: Cigarettes    Quit date: 09/02/1976  . Smokeless tobacco: Never Used  . Alcohol Use: No   Allergies  Allergen Reactions  . Glyburide   . Methotrexate Derivatives Other (See Comments)    Patient cannot remember   Current Facility-Administered Medications  Medication Dose Route Frequency Provider Last Rate Last Dose  . 0.9 %  sodium chloride infusion  250 mL Intravenous PRN Waldemar Dickens, MD      . acetaminophen (TYLENOL) tablet 1,000 mg  1,000 mg Oral TID PRN Waldemar Dickens, MD       . atorvastatin (LIPITOR) tablet 80 mg  80 mg Oral q1800 Vance Gather, MD   80 mg at 06/24/13 1751  . calcium acetate (PHOSLO) capsule 2,001 mg  2,001 mg Oral TID WC Donetta Potts, MD   2,001 mg at 06/25/13 1216  . cloNIDine (CATAPRES) tablet 0.2 mg  0.2 mg Oral BID Lucrezia Starch, MD   0.2 mg at 06/25/13 1041  . diltiazem (CARDIZEM CD) 24 hr capsule 180 mg  180 mg Oral Daily Donetta Potts, MD   180 mg at 06/25/13 1042  . docusate (COLACE) 50 MG/5ML liquid 100 mg  100 mg Oral BID Donetta Potts, MD   100 mg at 06/24/13 2140  . furosemide (LASIX) 100 mg in dextrose 5 % 50 mL IVPB  100 mg Intravenous TID Hilton Sinclair, MD   100 mg at 06/25/13 1054  . heparin injection 5,000 Units  5,000 Units Subcutaneous Q8H Lind Covert, MD   5,000 Units at 06/25/13 0539  . insulin aspart (novoLOG) injection 0-20 Units  0-20 Units Subcutaneous TID WC Vance Gather, MD   11 Units at 06/25/13 1216  . [START ON 06/26/2013] insulin glargine (LANTUS) injection 10 Units  10 Units Subcutaneous Daily Vance Gather, MD      . insulin glargine (LANTUS) injection 5 Units  5 Units Subcutaneous Once Vance Gather, MD      . metolazone (ZAROXOLYN) tablet 2.5 mg  2.5 mg  Oral Daily Vance Gather, MD      . ondansetron [facility]) tablet 4 mg  4 mg Oral Q6H PRN Waldemar Dickens, MD       Or  . ondansetron [facility]) injection 4 mg  4 mg Intravenous Q6H PRN Waldemar Dickens, MD      . pantoprazole (PROTONIX) EC tablet 20 mg  20 mg Oral Daily Donetta Potts, MD      . predniSONE (DELTASONE) tablet 60 mg  60 mg Oral Q breakfast Donetta Potts, MD   60 mg at 06/25/13 1041  . senna (SENOKOT) tablet 17.2 mg  2 tablet Oral Daily PRN Vance Gather, MD      . sodium bicarbonate tablet 1,300 mg  1,300 mg Oral BID Donetta Potts, MD   1,300 mg at 06/25/13 1042  . sodium chloride 0.9 % injection 3 mL  3 mL Intravenous Q12H Waldemar Dickens, MD   3 mL at 06/25/13 1043  . sodium chloride 0.9 % injection 3 mL  3 mL  Intravenous PRN Waldemar Dickens, MD       REVIEW OF SYSTEMS: Valu.Nieves ] denotes positive finding; [  ] denotes negative finding CARDIOVASCULAR:  [ ]  chest pain   [ ]  chest pressure   [ ]  palpitations   [ ]  orthopnea   [ ]  dyspnea on exertion   [ ]  claudication   [ ]  rest pain   [ ]  DVT   [ ]  phlebitis PULMONARY:   [ ]  productive cough   [ ]  asthma   [ ]  wheezing NEUROLOGIC:   [ ]  weakness  [ ]  paresthesias  [ ]  aphasia  [ ]  amaurosis  [ ]  dizziness HEMATOLOGIC:   [ ]  bleeding problems   [ ]  clotting disorders MUSCULOSKELETAL:  [ ]  joint pain   [ ]  joint swelling [ ]  leg swelling GASTROINTESTINAL: [ ]   blood in stool  [ ]   hematemesis GENITOURINARY:  [ ]   dysuria  [ ]   hematuria PSYCHIATRIC:  [ ]  history of major depression INTEGUMENTARY:  [ ]  rashes  [ ]  ulcers CONSTITUTIONAL:  [ ]  fever   [ ]  chills  PHYSICAL EXAM: Filed Vitals:   06/24/13 1355 06/24/13 2143 06/25/13 0718 06/25/13 0928  BP: 139/66 158/83 138/74 131/69  Pulse: 67 72 62 62  Temp: 98 F (36.7 C) 98.7 F (37.1 C) 98.2 F (36.8 C) 97.9 F (36.6 C)  TempSrc: Oral Oral Oral   Resp: 18 19 20 18   Height:  6' 1.5" (1.867 m)    Weight:  253 lb 3.2 oz (114.851 kg)    SpO2: 98% 94% 94% 100%   Body mass index is 32.95 kg/(m^2). GENERAL: The patient is a well-nourished male, in no acute distress. The vital signs are documented above. CARDIOVASCULAR: There is a regular rate and rhythm. I do not detect carotid bruits. He has palpable radial pulses bilaterally. PULMONARY: There is good air exchange bilaterally without wheezing or rales. ABDOMEN: Soft and non-tender with normal pitched bowel sounds.  MUSCULOSKELETAL: There are no major deformities or cyanosis. NEUROLOGIC: No focal weakness or paresthesias are detected. SKIN: There are no ulcers or rashes noted. PSYCHIATRIC: The patient has a normal affect.  DATA:  Lab Results  Component Value Date   WBC 9.8 06/22/2013   HGB 11.5* 06/22/2013   HCT 31.9* 06/22/2013   MCV 78.8  06/22/2013   PLT 339 06/22/2013   Lab Results  Component Value Date   NA  131* 06/25/2013   K 4.1 06/25/2013   CL 98 06/25/2013   CO2 13* 06/25/2013   Lab Results  Component Value Date   CREATININE 7.41* 06/25/2013   Lab Results  Component Value Date   INR 1.07 06/20/2013   Lab Results  Component Value Date   HGBA1C 9.4* 06/19/2013   CBG (last 3)   Recent Labs  06/25/13 0635 06/25/13 0722 06/25/13 1129  GLUCAP 255* 257* 262*   I have independently interpreted his vein mapping. The forearm and upper arm cephalic vein in both upper extremities appear to be reasonable for an AV fistula.  MEDICAL ISSUES: ACUTE RENAL FAILURE: We have been asked to proceed with placement of an AV fistula on Monday. I have reviewed his vein map to given that he is right-handed, and has an IV in the right arm, we'll plan on placement of a left radial cephalic or left brachiocephalic AV fistula. If his kidney function worsens then he could potentially place a tunneled dialysis catheter at the same time. If his kidney function improved significantly and we may be asked to hold off on placement of an AV fistula. I have discussed the indications for the procedure and the potential complications including but limited to bleeding, wound healing problems, failure of the fistula to mature, or the need for further work such as venoplasty or branch ligation. All his questions were answered he is agreeable to proceed.  [place] [facility] [place]: [phone]

## 2013-06-25 NOTE — Progress Notes (Addendum)
FMTS Attending Daily Note:  Gary Sabal MD  [phone] pager  Family Practice pager:  [phone] I have seen and examined this patient and have reviewed their chart. I have discussed this patient with the resident. I agree with the resident's findings, assessment and care plan.  Additionally:  Appreciate renal rec's in gentleman with unfortunate case. PO steroids.  Metalozone plus oral diuretics today.  Monitor for diuresis.   Vascular surgery has been contacted for vein mapping for possible renal replacement  Gary Reasons, MD 06/25/2013 2:24 PM

## 2013-06-25 NOTE — Progress Notes (Signed)
Family Medicine Teaching Service Daily Progress Note Intern Pager: [phone]  Patient name: Gary Lang Medical record number: [id number] Date of birth: 04-09-1951 Age: 62 y.o. Gender: male  Primary Care Provider: Jeri Modena Consultants: Nephrology Code Status: Full  Pt Overview and Major Events to Date:  10/18: IV Solumedrol started 10/19: 24hr urine with 12-18g proteinuria 10/20: Percutaneous renal Bx 10/22: Awaiting Bx results 10/23: Bx: FSGS, started on prednisone  Assessment and Plan: Gary Lang is a 62 y.o.admitted for extremities swelling and AKI. PMH is significant for rheumatoid arthritis, HTN, and T2DM.   # FSGS with nephrotic proteinuria - Lasix increased to 100mg  IV TID: Wt not significantly changes since admission (up 32lbs from dry weight) - Collapsing idiopathic FSGS on renal biopsy. Pt aware of likely poor response to steroids and likely need for HD.  - Will order vein mapping and vascular surgery consult for potential HD access - Prednisone 60mg  po daily (10/23-) likely for 2 months  # AKI: Creatinine stable/improving, with ATN on biopsy, hopefully somewhat reversible, due to massive proteinuria - Holding abatacept, metformin and lisinopril.   #Metabolic acidosis: due to above.  - bicarb was started 10/21  - continue to monitor.   #Hyperphosphatemia: binders started  - Phoslo 1,334mg  TID  - continue to monitor.   # T2DM: A1c 9.4 on metformin alone.  - Hold metformin - SSI 24hr requirement 14u   - Increasing lantus to 10u with resistant SSI.  - Likely continue titrating insulin up given long-term steroids  #HTN. Holding lisinopril, continuing HCTZ.  - BP stable.   #Rheumatoid arthritis: no recent flares and excellent control on abatacept.  - Holding abatacept 125mg  IM qMondays  #Anemia of chronic disease:  - Hgb trending up.   FEN/GI: Renal diet Prophylaxis: Hep [place] TID, PPI  Disposition: Proceed with vascular surgery consult for HD  access and vein mapping, encourage ambulation. Needs more effective diuresis/improvement in acidosis prior to discharge  Subjective: Patient in good spirits this morning, aware of poor prognosis and likely need for RRT. States edema is about the same. + urinating. Wife at the bedside.   Objective: Temp:  [98 F (36.7 C)-98.7 F (37.1 C)] 98.7 F (37.1 C) (10/23 2143) Pulse Rate:  [67-72] 72 (10/23 2143) Resp:  [18-19] 19 (10/23 2143) BP: (139-158)/(66-83) 158/83 mmHg (10/23 2143) SpO2:  [94 %-98 %] 94 % (10/23 2143) Weight:  [253 lb 3.2 oz (114.851 kg)] 253 lb 3.2 oz (114.851 kg) (10/23 2143)  Filed Weights   06/22/13 2028 06/23/13 2024 06/24/13 2143  Weight: 251 lb 12.3 oz (114.202 kg) 251 lb 12 oz (114.193 kg) 253 lb 3.2 oz (114.851 kg)   Physical Exam: General: 62 yo male found sitting in bed in NAD Cardiovascular: RRR, soft II/VI systolic murmur Respiratory: Nonlabored, CTAB on room air Abdomen: NABS, mildly distended, non-tender to palpation  Extremities: diffuse 3+ pitting edema of UE and LE, nontender, 2+ pulses  Laboratory:  Recent Labs Lab 06/21/13 0535 06/21/13 1621 06/22/13 0440  WBC 8.1 8.8 9.8  HGB 10.7* 10.3* 11.5*  HCT 29.5* 29.1* 31.9*  PLT 269 264 339    Recent Labs Lab 06/18/13 1240  06/19/13 0538  06/22/13 0440 06/23/13 0425 06/24/13 0500  NA 140  --  138  < > 138 136 135  K 3.3*  --  3.1*  < > 3.9 3.5 3.5  CL 108  --  107  < > 106 103 101  CO2 21  --  19  < > 16*  15* 18*  BUN 50*  --  51*  < > 84* 94* 102*  CREATININE 6.49*  < > 6.63*  < > 7.69* 7.61* 7.67*  CALCIUM 7.5*  --  7.5*  < > 7.4* 7.2* 7.3*  PROT 5.5*  --  5.2*  --   --   --   --   BILITOT <0.1*  --  0.1*  --   --   --   --   ALKPHOS 79  --  75  --   --   --   --   ALT 10  --  11  --   --   --   --   AST 12  --  12  --   --   --   --   GLUCOSE 125*  --  112*  < > 161* 275* 197*  < > = values in this interval not displayed.   Recent Labs Lab 06/23/13 2027 06/24/13 0724  06/24/13 1137 06/24/13 1659 06/24/13 2138  GLUCAP 190* 161* 205* 231* 250*    Imaging/Diagnostic Tests:  06/10/2013  IMPRESSION: Medical renal disease changes in both kidneys. 3.6 cm diameter left renal cyst. Vague hypoechoic area within left kidney, potentially artifact but difficult completely exclude mass. Consider non contrast MR imaging (non contrast due to renal failure) to exclude solid left renal mass lesion.   Vance Gather, MD 06/25/2013, 7:02 AM PGY-1, [place] Intern pager: [phone], text pages welcome

## 2013-06-26 LAB — RENAL FUNCTION PANEL
Albumin: 1.2 g/dL — ABNORMAL LOW (ref 3.5–5.2)
Calcium: 7.7 mg/dL — ABNORMAL LOW (ref 8.4–10.5)
Chloride: 96 mEq/L (ref 96–112)
GFR calc Af Amer: 8 mL/min — ABNORMAL LOW (ref >90)
GFR calc non Af Amer: 7 mL/min — ABNORMAL LOW (ref >90)
Glucose, Bld: 296 mg/dL — ABNORMAL HIGH (ref 70–99)
Phosphorus: 8.8 mg/dL — ABNORMAL HIGH (ref 2.3–4.6)
Potassium: 3.6 mEq/L (ref 3.5–5.1)
Sodium: 130 mEq/L — ABNORMAL LOW (ref 135–145)

## 2013-06-26 LAB — GLUCOSE, CAPILLARY
Glucose-Capillary: 263 mg/dL — ABNORMAL HIGH (ref 70–99)
Glucose-Capillary: 287 mg/dL — ABNORMAL HIGH (ref 70–99)

## 2013-06-26 MED ORDER — INSULIN GLARGINE 100 UNIT/ML ~~LOC~~ SOLN
15.0000 [IU] | Freq: Every day | SUBCUTANEOUS | Status: DC
  Administered 2013-06-26: 15 [IU] via SUBCUTANEOUS
  Filled 2013-06-26 (×2): qty 0.15

## 2013-06-26 MED ORDER — INSULIN PEN STARTER KIT
1.0000 | Freq: Once | Status: AC
  Administered 2013-06-26: 1
  Filled 2013-06-26: qty 1

## 2013-06-26 MED ORDER — DOCUSATE SODIUM 100 MG PO CAPS
100.0000 mg | ORAL_CAPSULE | Freq: Two times a day (BID) | ORAL | Status: DC
  Administered 2013-06-26 – 2013-07-01 (×10): 100 mg via ORAL
  Filled 2013-06-26 (×12): qty 1

## 2013-06-26 NOTE — Progress Notes (Signed)
Patient ID: Gary Lang, male   DOB: 15-Jan-1951, 62 y.o.   MRN: [id number] S:feels good O:BP 153/72  Pulse 57  Temp(Src) 97.7 F (36.5 C) (Oral)  Resp 19  Ht 6' 1.5" (1.867 m)  Wt 114.851 kg (253 lb 3.2 oz)  BMI 32.95 kg/m2  SpO2 99%  Intake/Output Summary (Last 24 hours) at 06/26/13 1043 Last data filed at 06/26/13 1008  Gross per 24 hour  Intake   1420 ml  Output    925 ml  Net    495 ml   Intake/Output: I/O last 3 completed shifts: In: 1600 [P.O.:1120; Other:250; IV Piggyback:230] Out: [id number]  Intake/Output this shift:  Total I/O In: 240 [P.O.:240] Out: 200 [Urine:200] Weight change:  Gen:WD WN AAM in NAD CVS:no rub Resp:decreased BS at bases [id number] Ext:2+edema   Recent Labs Lab 06/20/13 0415 06/21/13 0535 06/22/13 0440 06/23/13 0425 06/24/13 0500 06/25/13 0626 06/26/13 0845  NA 137 135 138 136 135 131* 130*  K 3.6 3.7 3.9 3.5 3.5 4.1 3.6  CL 106 103 106 103 101 98 96  CO2 18* 17* 16* 15* 18* 13* 17*  GLUCOSE 303* 311* 161* 275* 197* 247* 296*  BUN 56* 65* 84* 94* 102* 107* 114*  CREATININE 6.65* 7.02* 7.69* 7.61* 7.67* 7.41* 7.76*  ALBUMIN 1.1* 1.1* 1.2* 1.0* 1.0* 0.9* 1.2*  CALCIUM 7.7* 7.4* 7.4* 7.2* 7.3* 7.2* 7.7*  PHOS 6.9* 7.7* 8.7* 9.2* 9.3* 9.7* 8.8*   Liver Function Tests:  Recent Labs Lab 06/24/13 0500 06/25/13 0626 06/26/13 0845  ALBUMIN 1.0* 0.9* 1.2*   No results found for this basename: LIPASE, AMYLASE,  in the last 168 hours No results found for this basename: AMMONIA,  in the last 168 hours CBC:  Recent Labs Lab 06/21/13 0535 06/21/13 1621 06/22/13 0440  WBC 8.1 8.8 9.8  HGB 10.7* 10.3* 11.5*  HCT 29.5* 29.1* 31.9*  MCV 77.6* 78.6 78.8  PLT 269 264 339   Cardiac Enzymes: No results found for this basename: CKTOTAL, CKMB, CKMBINDEX, TROPONINI,  in the last 168 hours CBG:  Recent Labs Lab 06/25/13 0635 06/25/13 0722 06/25/13 1129 06/25/13 1631 06/25/13 2139  GLUCAP 255* 257* 262* 260* 284*     Iron Studies: No results found for this basename: IRON, TIBC, TRANSFERRIN, FERRITIN,  in the last 72 hours Studies/Results: No results found. Marland Kitchen atorvastatin  80 mg Oral q1800  . calcium acetate  2,001 mg Oral TID WC  . cloNIDine  0.2 mg Oral BID  . diltiazem  180 mg Oral Daily  . docusate sodium  100 mg Oral BID  . furosemide  100 mg Intravenous TID  . heparin subcutaneous  5,000 Units Subcutaneous Q8H  . insulin aspart  0-20 Units Subcutaneous TID WC  . insulin glargine  15 Units Subcutaneous Daily  . metolazone  2.5 mg Oral Daily  . pantoprazole  20 mg Oral Daily  . predniSONE  60 mg Oral Q breakfast  . sodium bicarbonate  1,300 mg Oral BID  . sodium chloride  3 mL Intravenous Q12H    BMET    Component Value Date/Time   NA 130* 06/26/2013 0845   K 3.6 06/26/2013 0845   CL 96 06/26/2013 0845   CO2 17* 06/26/2013 0845   GLUCOSE 296* 06/26/2013 0845   BUN 114* 06/26/2013 0845   CREATININE 7.76* 06/26/2013 0845   CALCIUM 7.7* 06/26/2013 0845   GFRNONAA 7* 06/26/2013 0845   GFRAA 8* 06/26/2013 0845   CBC    Component Value  Date/Time   WBC 9.8 06/22/2013 0440   RBC 4.05* 06/22/2013 0440   HGB 11.5* 06/22/2013 0440   HCT 31.9* 06/22/2013 0440   PLT 339 06/22/2013 0440   MCV 78.8 06/22/2013 0440   MCH 28.4 06/22/2013 0440   MCHC 36.1* 06/22/2013 0440   RDW 12.8 06/22/2013 0440   LYMPHSABS 1.6 06/18/2013 1240   MONOABS 0.4 06/18/2013 1240   EOSABS 0.2 06/18/2013 1240   BASOSABS 0.0 06/18/2013 1240     Assessment/Plan:  1. AKI/nephrotic syndrome. Slight improvement of Scr. Serologies negative and renal biopsy results (performed 06/21/13) c/w collapsing FSGS. SCr starting to improve.  1. Discussed natural history of collapsing FSG and poor response rate.  2. We also discussed that he may require HD if his anasarca and/or Scr cont to worsen.  3. Started on prednisone 60mg  qd  4. Agree with cont with IV diuresis and add metolazone 5. Discussed possible need for HD  on Monday if his renal function does not improve (pt would like to wait and is hoping it will get better).  I discussed with Dr. Scot Dock and Mr. Helmick is on the schedule for [facility] and AVF on Monday.  If his renal function improves, will only proceed with AVF but I suspect he will require initiation of HD at least temporarily given ATN on bx and persistent edema. 6. He also had a component of ATN which should hopefully improve with time. 2. HTN- stable, now on dilt to help with proteinuria and HTN 3. GI prophylaxis- start on protonix 20mg  qd d/t pred 4. Hypoalbuminemia- d/t Nephrotic syndrome. Would recommend IV salt-poor albumin to help with diuresis. 5. Metabolic acidosis- due to #1. Started NaHCO3 2 po bid and follow  6. Hyperphosphatemia- will increase binders, phoslo 667 to 3 qac tid and follow. 7. DM- metformin on hold. Plan per primary svc 8. RA- agree with holding abatacept for now.  9. Hyperlipidemia due to NS- start statin per primary svc. 57. ACDz- follow h/h after biopsy 11. Dispo- still markedly volume overloaded and has met acidosis and azotemia. Will need to see improvement of volume status with po diuretics as well as electrolyte abnormalities prior to d/c to home. [place]

## 2013-06-26 NOTE — Progress Notes (Signed)
06/26/2013 10:45 AM  Reviewed insulin starter kit with patient.  Reviewed the lancets, needles for the pen, and what the glucose tabs are for.  No pen in kit to review how to administer the insulin, however, a verbal explanation was provided.  A diabetes coordinator consult was placed to assist in obtaining a pen to conduct teaching with for patient.  I also provided the patient with information on "How and Where to Give Insulin Injections, Adult", "Insulin Treatment in Diabetes," and "Type II Diabetes Mellitus, Adult".  Pt to read at his own leisure.  I instructed the patient verbally on how to check his blood sugars, and how often to check his blood sugars, to which he verbalized understanding.  He states that he does not own a meter, and will need one at DC.  I instructed the patient as well as the NT to have the patient check his blood sugar at lunch to make sure that he is comfortable doing so at discharge--will pass along to the oncoming nurse to supervise and answer questions as needed.  Pt states that he is somewhat familiar with insulin pen, as his son uses one.  Will continue to monitor patient. Princella Pellegrini

## 2013-06-26 NOTE — Progress Notes (Signed)
FMTS Attending Daily Note:  [name] MD  [phone] pager  Family Practice pager:  [phone] I have seen and examined this patient and have reviewed their chart. I have discussed this patient with the resident. I agree with the resident's findings, assessment and care plan.   [name], MD 06/26/2013 12:12 PM

## 2013-06-26 NOTE — Progress Notes (Signed)
Family Medicine Teaching Service Daily Progress Note Intern Pager: [phone]  Patient name: Gary Lang Medical record number: [id number] Date of birth: 02/21/1951 Age: 62 y.o. Gender: male  Primary Care Provider: Jeri Modena Consultants: Nephrology, Vascular surgery Code Status: Full  Pt Overview and Major Events to Date:  10/18: IV Solumedrol started 10/19: 24hr urine with 12-18g proteinuria 10/20: Percutaneous renal Bx 10/22: Awaiting Bx results 10/23: Bx: FSGS, started on prednisone 60mg    Assessment and Plan: Gary Lang is a 62 y.o.admitted for anasarca and AKI. PMH is significant for rheumatoid arthritis, HTN, and T2DM.   # FSGS with nephrotic proteinuria - Collapsing idiopathic FSGS on renal biopsy. Pt aware of likely poor response to steroids and likely need for HD.  - Lasix 100mg  IV TID: Wt not significantly changed since admission (up 32lbs from dry weight). I/O past 24hrs: 1420/1125 = +295. Suspect uncharted UOP - Prednisone 60mg  po daily (10/23-) likely for 2 months. (titrating insulin, added PPI ppx) - Vascular surgery to place AVG on Monday, 10/27 pending renal function. May also place tunneled dialysis catheter if renal function worsening or cancel case if dramatically improving.  # AKI/nephrotic syndrome: Creatinine stable/improving, with ATN on biopsy, hopefully somewhat reversible, due to massive proteinuria - Holding abatacept, metformin and lisinopril.  # Hypoalbuminemia due to nephrotic syndrome  - Worsening albumin 1.9 > 0.9. Received 250cc albumin 10/24. Repeat albumin 1.2  # Hyperlipidemia due to nephrotic syndrome - LDL 250, high-intensity statin begun [id number]  # Metabolic acidosis: due to above, worsening downward trend  - bicarb was started 10/21: 13 > 17 - continue to monitor.   # Hyperphosphatemia: binders started  - Phoslo 2,001 mg TID: Improving 9.7 > 8.8 - continue to monitor.   # Hyponatremia - Due to diuresis. Stable 131 >  130  # Hypocalcemia - Improved 7.2 > 7.7  # T2DM: A1c 9.4 on metformin alone.  - Hold metformin   - Increasing lantus to 15u with resistant SSI (22u/24hrs required); CBGs increasing uniformly - RN to give insulin administration education with insulin pen starter kit as he will be on steroids long-term.   #HTN. Holding lisinopril, continuing HCTZ and clonidine - BP stable.   #Rheumatoid arthritis: no recent flares and excellent control on abatacept.  - Holding abatacept 125mg  IM qMondays  #Anemia of chronic disease:  - Hgb trending up. 11.5 on 10/21  FEN/GI: Renal diet Prophylaxis: Hep  TID, PPI  Disposition: Proceed with AVF placement Monday +/- dialysis cath per vascular surgery. Continue efforts at diuresis/improvement in acidosis/azotemia prior to discharge.  Subjective: Pt slept well and reports improved swelling overnight. Eating well, urinating more frequently. He would prefer to not get AVF Monday unless he absolutely has to. Wife at the bedside.  Objective: Temp:  [97.3 F (36.3 C)-97.9 F (36.6 C)] 97.6 F (36.4 C) (10/25 0559) Pulse Rate:  [57-64] 63 (10/25 0559) Resp:  [18-19] 18 (10/25 0559) BP: (130-151)/(64-85) 151/85 mmHg (10/25 0559) SpO2:  [98 %-100 %] 99 % (10/25 0559)  Filed Weights   06/22/13 2028 06/23/13 2024 06/24/13 2143  Weight: 251 lb 12.3 oz (114.202 kg) 251 lb 12 oz (114.193 kg) 253 lb 3.2 oz (114.851 kg)   Physical Exam: General: 62 yo male sitting in bed eating breakfast Cardiovascular: RRR, soft II/VI systolic murmur Respiratory: Nonlabored, CTAB on room air Abdomen: NABS, NT, ND Extremities: diffuse 2+ pitting edema of UE and LE improved from previous, nontender, 2+ pulses  Laboratory:  Recent Labs Lab 06/21/13 0535 06/21/13 1621  06/22/13 0440  WBC 8.1 8.8 9.8  HGB 10.7* 10.3* 11.5*  HCT 29.5* 29.1* 31.9*  PLT 269 264 339    Recent Labs Lab 06/23/13 0425 06/24/13 0500 06/25/13 0626  Gary 136 135 131*  K 3.5 3.5 4.1  CL  103 101 98  CO2 15* 18* 13*  BUN 94* 102* 107*  CREATININE 7.61* 7.67* 7.41*  CALCIUM 7.2* 7.3* 7.2*  GLUCOSE 275* 197* 247*     Recent Labs Lab 06/25/13 0635 06/25/13 0722 06/25/13 1129 06/25/13 1631 06/25/13 2139  GLUCAP 255* 257* 262* 260* 284*    06/18/2013 13:11  Color, Urine YELLOW  APPearance CLOUDY (A)  Specific Gravity, Urine 1.022  pH 5.5  Glucose 250 (A)  Bilirubin Urine NEGATIVE  Ketones, ur NEGATIVE  Protein >300 (A)  Urobilinogen, UA 0.2  Nitrite NEGATIVE  Leukocytes, UA NEGATIVE  Hgb urine dipstick MODERATE (A)  WBC, UA 3-6  RBC / HPF 0-2  Squamous Epithelial / LPF FEW (A)  Bacteria, UA FEW (A)  Casts GRANULAR CAST (A)   PT/INR: 13.7/1.07 ANA: Neg ASO: < 25 GBM Ab: < 1 MPO Ab: < 1 Ser Protease-3: 1 C3: 199 C4: 44 Alpha-1-globulin: 6.8 Alpha-2-globulin: 25.8 Heb B SAg: Neg HCV Ab: Neg HIV: Non-reactive U Protein: Creatinine = 12.41 24hr protein: 18.690g  UCx 10/17: 50k colonies of multiple bacterial morphotypes, none predominant   06/19/2013 05:38  Cholesterol 325 (H)  Triglycerides 145  HDL 46  LDL (calc) 250 (H)  VLDL 29  Total CHOL/HDL Ratio 7.1   Imaging/Diagnostic Tests:   ULTRASOUND-GUIDED CORE BIOPSY OF LEFT KIDNEY 10/20 FINDINGS:  Both kidneys were visible from a posterior flank approach. The lower  pole cortex of the left kidney was felt to be slightly more visible  than the right kidney. Solid and intact core biopsy samples were  obtained. There was no immediate ultrasound evidence of a bleeding  complication.  IMPRESSION:  Ultrasound-guided core biopsy performed of the left kidney.  LIMITED RENAL/URINARY TRACT ULTRASOUND COMPLETE  COMPARISON: Prior renal ultrasound 06/10/2013  FINDINGS:  Right Kidney  Length: 12.4 cm. Echogenic renal parenchyma. No hydronephrosis. No  solid renal mass.  Left Kidney  Length: 12.7 cm. Echogenic renal parenchyma. No hydronephrosis. 3.6  x 3.1 cm simple cyst exophytic from the  upper pole of the kidney.  IMPRESSION:  1. The lower pole of the right kidney is approachable for  percutaneous biopsy.  2. Echogenic renal parenchyma suggests underlying medical renal  disease.  3. Negative for hydronephrosis.  06/10/2013  IMPRESSION: Medical renal disease changes in both kidneys. 3.6 cm diameter left renal cyst. Vague hypoechoic area within left kidney, potentially artifact but difficult completely exclude mass. Consider non contrast MR imaging (non contrast due to renal failure) to exclude solid left renal mass lesion.   Vance Gather, MD 06/26/2013, 7:28 AM PGY-1, [place] Intern pager: [phone], text pages welcome

## 2013-06-26 NOTE — Progress Notes (Signed)
Received consult. Spoke with staff RN on the phone.  Each floor should have an insulin pen practice kit and the staff RN can use it to show patient how to use.  Insulin pen starter kit provides needles and printed information and instructions on how to use pen.  Staff RN had noted that patient will need a prescription for home blood glucose meter and strips at discharge.  Will need to make sure patient knows how to check own blood sugars.  May want the patient to watch DM video #511 that gives instruction on how to use insulin pen.   Could have patient watch DM videos [phone] for general information on diabetes. Give patient DM [place] notes before discharge.   Will continue to follow while in hospital.   Harvel Ricks RN BSN CDE

## 2013-06-27 LAB — RENAL FUNCTION PANEL
CO2: 17 mEq/L — ABNORMAL LOW (ref 19–32)
Calcium: 7.5 mg/dL — ABNORMAL LOW (ref 8.4–10.5)
Creatinine, Ser: 7.7 mg/dL — ABNORMAL HIGH (ref 0.50–1.35)
GFR calc Af Amer: 8 mL/min — ABNORMAL LOW (ref >90)
GFR calc non Af Amer: 7 mL/min — ABNORMAL LOW (ref >90)
Phosphorus: 8.9 mg/dL — ABNORMAL HIGH (ref 2.3–4.6)
Sodium: 133 mEq/L — ABNORMAL LOW (ref 135–145)

## 2013-06-27 LAB — GLUCOSE, CAPILLARY
Glucose-Capillary: 238 mg/dL — ABNORMAL HIGH (ref 70–99)
Glucose-Capillary: 255 mg/dL — ABNORMAL HIGH (ref 70–99)

## 2013-06-27 MED ORDER — INSULIN GLARGINE 100 UNIT/ML ~~LOC~~ SOLN
20.0000 [IU] | Freq: Once | SUBCUTANEOUS | Status: AC
  Administered 2013-06-27: 20 [IU] via SUBCUTANEOUS
  Filled 2013-06-27: qty 0.2

## 2013-06-27 MED ORDER — INSULIN GLARGINE 100 UNIT/ML ~~LOC~~ SOLN
2.5000 [IU] | Freq: Every day | SUBCUTANEOUS | Status: DC
  Administered 2013-06-29 – 2013-07-01 (×3): 3 [IU] via SUBCUTANEOUS
  Filled 2013-06-27 (×4): qty 0.03

## 2013-06-27 MED ORDER — INSULIN GLARGINE 100 UNIT/ML ~~LOC~~ SOLN
20.0000 [IU] | Freq: Every day | SUBCUTANEOUS | Status: DC
  Filled 2013-06-27: qty 0.2

## 2013-06-27 NOTE — Progress Notes (Signed)
FMTS Attending Daily Note:  [name] MD  [phone] pager  Family Practice pager:  [phone] I have seen and examined this patient and have reviewed their chart. I have discussed this patient with the resident. I agree with the resident's findings, assessment and care plan.  Greatly appreciate Nephro rec's.  Plan for hopefully temporary HD tomorrow.   [name], MD 06/27/2013 10:41 AM

## 2013-06-27 NOTE — Progress Notes (Signed)
Family Medicine Teaching Service Daily Progress Note Intern Pager: [phone]  Patient name: Gary Lang Medical record number: [id number] Date of birth: 1951-07-02 Age: 62 y.o. Gender: male  Primary Care Provider: Jeri Modena Consultants: Nephrology, Vascular surgery Code Status: Full  Pt Overview and Major Events to Date:  10/18: IV Solumedrol started 10/19: 24hr urine with 12-18g proteinuria 10/20: Percutaneous renal Bx 10/22: Awaiting Bx results 10/23: Bx: FSGS, started on prednisone 60mg    Assessment and Plan: Gary Lang is a 62 y.o.admitted for anasarca and AKI. PMH is significant for rheumatoid arthritis, HTN, and T2DM.   # FSGS with nephrotic proteinuria - Collapsing idiopathic FSGS on renal biopsy. Pt aware of likely poor response to steroids and likely need for HD.  - Lasix 100mg  IV TID: Wt not significantly changed since admission (up 32lbs from dry weight).  - Prednisone 60mg  po daily (10/23-) likely for 2 months. (titrating insulin, added PPI ppx) - Vascular surgery to place AVG on Monday, 10/27. May also place tunneled dialysis catheter for refractory anasarca/acidosis/electrolyte derangements. - NPO p MN  # T2DM: A1c 9.4 on metformin alone.  - Hold metformin   - Increasing lantus to 20u with resistant SSI (22u/24hrs required); CBGs increasing uniformly - Received diabetic education, will be sent home on insulins as he will be on steroids long-term.   # AKI/nephrotic syndrome: Creatinine stable >7, with ATN on biopsy, hopefully somewhat reversible, due to massive proteinuria - Holding abatacept, metformin and lisinopril.  # Hypoalbuminemia due to nephrotic syndrome  - Worsening albumin 1.9 > 0.9 >(Received 250cc albumin 10/24)> 1.2  # Hyperlipidemia due to nephrotic syndrome - LDL 250, high-intensity statin begun [id number]  # Metabolic acidosis: due to above, worsening downward trend  - Bicarb was started 10/21: 13 > 17 > 17  # Hyperphosphatemia:  binders started  - Phoslo 2,001 mg TID: Improving 9.7 > 8.8  # Hyponatremia - Due to diuresis. 131 > 130 > 133  # Hypocalcemia - 7.2 > 7.7 >7.5  # Hypokalemia  - K 3.0 today, effect of lasix. Defer to nephrology's recommendations concerning repletion during acute renal failure.   #HTN. Holding lisinopril, continuing HCTZ and clonidine - BP stable.   #Rheumatoid arthritis: no recent flares and excellent control on abatacept.  - Holding abatacept 125mg  IM qMondays  #Anemia of chronic disease:  - Hgb trending up. 11.5 on 10/21  FEN/GI: Renal diet Prophylaxis: Hep [place] TID, PPI  Disposition: Proceed with AVF placement Monday +/- dialysis cath per vascular surgery and nephrology. Continue efforts at diuresis/improvement in acidosis/azotemia prior to discharge.  Subjective: Swelling not improved from yesterday. Eating, sleeping, urinating well. Had a BM this AM.   Objective: Temp:  [97.5 F (36.4 C)-98 F (36.7 C)] 97.7 F (36.5 C) (10/26 0535) Pulse Rate:  [57-61] 58 (10/26 0535) Resp:  [18-19] 18 (10/26 0535) BP: (131-167)/(62-78) 167/78 mmHg (10/26 0535) SpO2:  [98 %-99 %] 99 % (10/26 0535) Weight:  [253 lb 3.2 oz (114.851 kg)] 253 lb 3.2 oz (114.851 kg) (10/25 2052)  Filed Weights   06/23/13 2024 06/24/13 2143 06/26/13 2052  Weight: 251 lb 12 oz (114.193 kg) 253 lb 3.2 oz (114.851 kg) 253 lb 3.2 oz (114.851 kg)   Physical Exam: General: 62 yo male sitting in bed eating breakfast Cardiovascular: RRR, soft II/VI systolic murmur Respiratory: Nonlabored, CTAB on room air Abdomen: NABS, NT, ND Extremities: diffuse 3+ pitting edema of UE and LE, nontender, 2+ pulses  Laboratory:  Recent Labs Lab 06/21/13 0535 06/21/13 1621 06/22/13  0440  WBC 8.1 8.8 9.8  HGB 10.7* 10.3* 11.5*  HCT 29.5* 29.1* 31.9*  PLT 269 264 339    Recent Labs Lab 06/25/13 0626 06/26/13 0845 06/27/13 0550  NA 131* 130* 133*  K 4.1 3.6 3.0*  CL 98 96 98  CO2 13* 17* 17*  BUN 107* 114*  PENDING  CREATININE 7.41* 7.76* 7.70*  CALCIUM 7.2* 7.7* 7.5*  GLUCOSE 247* 296* 262*     Recent Labs Lab 06/25/13 2139 06/26/13 0753 06/26/13 1144 06/26/13 1634 06/26/13 2056  GLUCAP 284* 250* 271* 263* 287*    06/18/2013 13:11  Color, Urine YELLOW  APPearance CLOUDY (A)  Specific Gravity, Urine 1.022  pH 5.5  Glucose 250 (A)  Bilirubin Urine NEGATIVE  Ketones, ur NEGATIVE  Protein >300 (A)  Urobilinogen, UA 0.2  Nitrite NEGATIVE  Leukocytes, UA NEGATIVE  Hgb urine dipstick MODERATE (A)  WBC, UA 3-6  RBC / HPF 0-2  Squamous Epithelial / LPF FEW (A)  Bacteria, UA FEW (A)  Casts GRANULAR CAST (A)   PT/INR: 13.7/1.07 ANA: Neg ASO: < 25 GBM Ab: < 1 MPO Ab: < 1 Ser Protease-3: 1 C3: 199 C4: 44 Alpha-1-globulin: 6.8 Alpha-2-globulin: 25.8 Heb B SAg: Neg HCV Ab: Neg HIV: Non-reactive U Protein: Creatinine = 12.41 24hr protein: 18.690g  UCx 10/17: 50k colonies of multiple bacterial morphotypes, none predominant   06/19/2013 05:38  Cholesterol 325 (H)  Triglycerides 145  HDL 46  LDL (calc) 250 (H)  VLDL 29  Total CHOL/HDL Ratio 7.1   Imaging/Diagnostic Tests:   ULTRASOUND-GUIDED CORE BIOPSY OF LEFT KIDNEY 10/20 FINDINGS:  Both kidneys were visible from a posterior flank approach. The lower  pole cortex of the left kidney was felt to be slightly more visible  than the right kidney. Solid and intact core biopsy samples were  obtained. There was no immediate ultrasound evidence of a bleeding  complication.  IMPRESSION:  Ultrasound-guided core biopsy performed of the left kidney.  LIMITED RENAL/URINARY TRACT ULTRASOUND COMPLETE  COMPARISON: Prior renal ultrasound 06/10/2013  FINDINGS:  Right Kidney  Length: 12.4 cm. Echogenic renal parenchyma. No hydronephrosis. No  solid renal mass.  Left Kidney  Length: 12.7 cm. Echogenic renal parenchyma. No hydronephrosis. 3.6  x 3.1 cm simple cyst exophytic from the upper pole of the kidney.  IMPRESSION:   1. The lower pole of the right kidney is approachable for  percutaneous biopsy.  2. Echogenic renal parenchyma suggests underlying medical renal  disease.  3. Negative for hydronephrosis.  06/10/2013  IMPRESSION: Medical renal disease changes in both kidneys. 3.6 cm diameter left renal cyst. Vague hypoechoic area within left kidney, potentially artifact but difficult completely exclude mass. Consider non contrast MR imaging (non contrast due to renal failure) to exclude solid left renal mass lesion.   Vance Gather, MD 06/27/2013, 7:08 AM PGY-1, [place] Intern pager: [phone], text pages welcome

## 2013-06-27 NOTE — Progress Notes (Signed)
VASCULAR PROGRESS NOTE  SUBJECTIVE: No complaints  PHYSICAL EXAM: Filed Vitals:   06/26/13 1452 06/26/13 1702 06/26/13 2052 06/27/13 0535  BP: 131/62 132/65 148/64 167/78  Pulse: 61 57 61 58  Temp: 97.5 F (36.4 C) 98 F (36.7 C) 97.7 F (36.5 C) 97.7 F (36.5 C)  TempSrc: Oral Oral Oral Oral  Resp: 18  18 18   Height:      Weight:   253 lb 3.2 oz (114.851 kg)   SpO2: 98% 98% 99% 99%   Palpable radial pulses bilat.   LABS: Lab Results  Component Value Date   WBC 9.8 06/22/2013   HGB 11.5* 06/22/2013   HCT 31.9* 06/22/2013   MCV 78.8 06/22/2013   PLT 339 06/22/2013   Lab Results  Component Value Date   CREATININE 7.70* 06/27/2013   Lab Results  Component Value Date   INR 1.07 06/20/2013   CBG (last 3)   Recent Labs  06/26/13 1144 06/26/13 1634 06/26/13 2056  GLUCAP 271* 263* 287*   ASSESSMENT AND PLAN:  * Plan placement of left AVF tomorrow by Dr. Donnetta Hutching. I have explained the indications for placement of an AV fistula. I have reviewed the risks of placement of an AV fistula including but not limited to: failure of the fistula to mature, need for subsequent interventions, and thrombosis. All the patient's questions were answered and they are agreeable to proceed with surgery.  * Please let us know if a tunneled catheter is needed also. (decision per Nephrology).    Gae Gallop [id number] 06/27/2013

## 2013-06-27 NOTE — Progress Notes (Signed)
Patient ID: Gary Lang, male   DOB: 12-04-1950, 62 y.o.   MRN: [id number] S:feels better. O:BP 160/77  Pulse 61  Temp(Src) 97.8 F (36.6 C) (Oral)  Resp 19  Ht 6' 1.5" (1.867 m)  Wt 114.851 kg (253 lb 3.2 oz)  BMI 32.95 kg/m2  SpO2 98%  Intake/Output Summary (Last 24 hours) at 06/27/13 1033 Last data filed at 06/27/13 1005  Gross per 24 hour  Intake    900 ml  Output   1200 ml  Net   -300 ml   Intake/Output: I/O last 3 completed shifts: In: 960 [P.O.:720; IV Piggyback:240] Out: [id number] [Urine:1725]  Intake/Output this shift:  Total I/O In: 240 [P.O.:240] Out: -  Weight change:  Gen:WD WN AAM in NAD CVS:no rub Resp:decreaed BS at bases [id number] Ext: 2+edema   Recent Labs Lab 06/21/13 0535 06/22/13 0440 06/23/13 0425 06/24/13 0500 06/25/13 0626 06/26/13 0845 06/27/13 0550  NA 135 138 136 135 131* 130* 133*  K 3.7 3.9 3.5 3.5 4.1 3.6 3.0*  CL 103 106 103 101 98 96 98  CO2 17* 16* 15* 18* 13* 17* 17*  GLUCOSE 311* 161* 275* 197* 247* 296* 262*  BUN 65* 84* 94* 102* 107* 114* 117*  CREATININE 7.02* 7.69* 7.61* 7.67* 7.41* 7.76* 7.70*  ALBUMIN 1.1* 1.2* 1.0* 1.0* 0.9* 1.2* 1.2*  CALCIUM 7.4* 7.4* 7.2* 7.3* 7.2* 7.7* 7.5*  PHOS 7.7* 8.7* 9.2* 9.3* 9.7* 8.8* 8.9*   Liver Function Tests:  Recent Labs Lab 06/25/13 0626 06/26/13 0845 06/27/13 0550  ALBUMIN 0.9* 1.2* 1.2*   No results found for this basename: LIPASE, AMYLASE,  in the last 168 hours No results found for this basename: AMMONIA,  in the last 168 hours CBC:  Recent Labs Lab 06/21/13 0535 06/21/13 1621 06/22/13 0440  WBC 8.1 8.8 9.8  HGB 10.7* 10.3* 11.5*  HCT 29.5* 29.1* 31.9*  MCV 77.6* 78.6 78.8  PLT 269 264 339   Cardiac Enzymes: No results found for this basename: CKTOTAL, CKMB, CKMBINDEX, TROPONINI,  in the last 168 hours CBG:  Recent Labs Lab 06/26/13 0753 06/26/13 1144 06/26/13 1634 06/26/13 2056 06/27/13 0742  GLUCAP 250* 271* 263* 287* 214*    Iron Studies: No  results found for this basename: IRON, TIBC, TRANSFERRIN, FERRITIN,  in the last 72 hours Studies/Results: No results found. Marland Kitchen atorvastatin  80 mg Oral q1800  . calcium acetate  2,001 mg Oral TID WC  . cloNIDine  0.2 mg Oral BID  . diltiazem  180 mg Oral Daily  . docusate sodium  100 mg Oral BID  . furosemide  100 mg Intravenous TID  . heparin subcutaneous  5,000 Units Subcutaneous Q8H  . insulin aspart  0-20 Units Subcutaneous TID WC  . insulin glargine  20 Units Subcutaneous Once  . metolazone  2.5 mg Oral Daily  . pantoprazole  20 mg Oral Daily  . predniSONE  60 mg Oral Q breakfast  . sodium bicarbonate  1,300 mg Oral BID  . sodium chloride  3 mL Intravenous Q12H    BMET    Component Value Date/Time   NA 133* 06/27/2013 0550   K 3.0* 06/27/2013 0550   CL 98 06/27/2013 0550   CO2 17* 06/27/2013 0550   GLUCOSE 262* 06/27/2013 0550   BUN 117* 06/27/2013 0550   CREATININE 7.70* 06/27/2013 0550   CALCIUM 7.5* 06/27/2013 0550   GFRNONAA 7* 06/27/2013 0550   GFRAA 8* 06/27/2013 0550   CBC    Component Value Date/Time  WBC 9.8 06/22/2013 0440   RBC 4.05* 06/22/2013 0440   HGB 11.5* 06/22/2013 0440   HCT 31.9* 06/22/2013 0440   PLT 339 06/22/2013 0440   MCV 78.8 06/22/2013 0440   MCH 28.4 06/22/2013 0440   MCHC 36.1* 06/22/2013 0440   RDW 12.8 06/22/2013 0440   LYMPHSABS 1.6 06/18/2013 1240   MONOABS 0.4 06/18/2013 1240   EOSABS 0.2 06/18/2013 1240   BASOSABS 0.0 06/18/2013 1240     Assessment/Plan:  1. AKI/nephrotic syndrome. Slight improvement of Scr. Serologies negative and renal biopsy results (performed 06/21/13) c/w collapsing FSGS. Non-oliguric and Scr stable at 1.7-1.8 however BUN continues to rise. 1. We discussed HD and he is amenable to starting HD tomorrow after PC placement and AVG/AVF by Dr. Donnetta Hutching.  This will help control his anasarca. I am hopeful that we will require HD at least temporarily given ATN on Biopsy and profound proteinuria.    2. We  alsand/or Scr cont to worsen.  3. Started on prednisone 60mg  qd  4. Agree with cont with IV diuresis and add metolazone 2. HTN- stable, now on dilt to help with proteinuria and HTN 3. GI prophylaxis- start on protonix 20mg  qd d/t pred 4. Hypoalbuminemia- d/t Nephrotic syndrome.  5. Metabolic acidosis- due to #1. Started NaHCO3 2 po bid and follow  6. Hyperphosphatemia- will increase binders, phoslo 667 to 3 qac tid and follow. 7. DM- metformin on hold. Plan per primary svc 8. RA- agree with holding abatacept for now.  9. Hyperlipidemia due to NS- start statin per primary svc. 22. ACDz- follow h/h after biopsy 11. Dispo- still markedly volume overloaded and has met acidosis and azotemia. Will need to see improvement of volume status with po diuretics as well as electrolyte abnormalities prior to d/c to home. [place]

## 2013-06-28 ENCOUNTER — Encounter (HOSPITAL_COMMUNITY): Payer: Self-pay | Admitting: Anesthesiology

## 2013-06-28 ENCOUNTER — Inpatient Hospital Stay (HOSPITAL_COMMUNITY): Payer: 59

## 2013-06-28 ENCOUNTER — Encounter (HOSPITAL_COMMUNITY): Payer: 59 | Admitting: Anesthesiology

## 2013-06-28 ENCOUNTER — Encounter (HOSPITAL_COMMUNITY)
Admission: EM | Disposition: A | Discharge status: Home / Self Care | Payer: Self-pay | Source: Home / Self Care | Attending: Family Medicine

## 2013-06-28 ENCOUNTER — Telehealth: Payer: Self-pay | Admitting: Vascular Surgery

## 2013-06-28 ENCOUNTER — Inpatient Hospital Stay (HOSPITAL_COMMUNITY): Payer: 59 | Admitting: Anesthesiology

## 2013-06-28 DIAGNOSIS — E785 Hyperlipidemia, unspecified: Secondary | ICD-10-CM

## 2013-06-28 HISTORY — PX: INSERTION OF DIALYSIS CATHETER: SHX1324

## 2013-06-28 HISTORY — PX: AV FISTULA PLACEMENT: SHX1204

## 2013-06-28 LAB — RENAL FUNCTION PANEL
Albumin: 1 g/dL — ABNORMAL LOW (ref 3.5–5.2)
BUN: 119 mg/dL — ABNORMAL HIGH (ref 6–23)
CO2: 19 mEq/L (ref 19–32)
Chloride: 95 mEq/L — ABNORMAL LOW (ref 96–112)
Creatinine, Ser: 7.22 mg/dL — ABNORMAL HIGH (ref 0.50–1.35)
Glucose, Bld: 255 mg/dL — ABNORMAL HIGH (ref 70–99)
Phosphorus: 9.1 mg/dL — ABNORMAL HIGH (ref 2.3–4.6)

## 2013-06-28 LAB — GLUCOSE, CAPILLARY
Glucose-Capillary: 213 mg/dL — ABNORMAL HIGH (ref 70–99)
Glucose-Capillary: 233 mg/dL — ABNORMAL HIGH (ref 70–99)
Glucose-Capillary: 296 mg/dL — ABNORMAL HIGH (ref 70–99)

## 2013-06-28 LAB — SURGICAL PCR SCREEN: Staphylococcus aureus: NEGATIVE

## 2013-06-28 LAB — HEPATITIS B SURFACE ANTIBODY,QUALITATIVE: Hep B S Ab: NEGATIVE

## 2013-06-28 LAB — HEPATITIS B CORE ANTIBODY, TOTAL: Hep B Core Total Ab: NONREACTIVE

## 2013-06-28 SURGERY — ARTERIOVENOUS (AV) FISTULA CREATION
Anesthesia: Monitor Anesthesia Care | Site: Neck | Wound class: Clean

## 2013-06-28 MED ORDER — HYDROMORPHONE HCL PF 1 MG/ML IJ SOLN: 0.2500 mg | INTRAMUSCULAR | Status: DC | PRN

## 2013-06-28 MED ORDER — PROPOFOL INFUSION 10 MG/ML OPTIME
INTRAVENOUS | Status: DC | PRN
  Administered 2013-06-28: 75 ug/kg/min via INTRAVENOUS

## 2013-06-28 MED ORDER — FENTANYL CITRATE 0.05 MG/ML IJ SOLN
INTRAMUSCULAR | Status: DC | PRN
  Administered 2013-06-28 (×4): 50 ug via INTRAVENOUS

## 2013-06-28 MED ORDER — SODIUM CHLORIDE 0.9 % IR SOLN
Status: DC | PRN
  Administered 2013-06-28: 10:00:00

## 2013-06-28 MED ORDER — ALTEPLASE 100 MG IV SOLR
4.0000 mg | Freq: Once | INTRAVENOUS | Status: AC
  Administered 2013-06-28: 4 mg
  Filled 2013-06-28: qty 4

## 2013-06-28 MED ORDER — 0.9 % SODIUM CHLORIDE (POUR BTL) OPTIME
TOPICAL | Status: DC | PRN
  Administered 2013-06-28: 1000 mL

## 2013-06-28 MED ORDER — CEFAZOLIN SODIUM 1-5 GM-% IV SOLN
INTRAVENOUS | Status: AC
  Filled 2013-06-28: qty 100

## 2013-06-28 MED ORDER — HEPARIN SODIUM (PORCINE) 1000 UNIT/ML IJ SOLN
INTRAMUSCULAR | Status: DC | PRN
  Administered 2013-06-28: 5 mL

## 2013-06-28 MED ORDER — SODIUM CHLORIDE 0.9 % IV SOLN
INTRAVENOUS | Status: DC | PRN
  Administered 2013-06-28: 09:00:00 via INTRAVENOUS

## 2013-06-28 MED ORDER — LIDOCAINE-EPINEPHRINE 0.5 %-1:200000 IJ SOLN
INTRAMUSCULAR | Status: DC | PRN
  Administered 2013-06-28: 50 mL

## 2013-06-28 MED ORDER — HEPARIN SOD (PORK) LOCK FLUSH 100 UNIT/ML IV SOLN
INTRAVENOUS | Status: AC
  Filled 2013-06-28: qty 5

## 2013-06-28 MED ORDER — SODIUM CHLORIDE 0.9 % IV SOLN: 100.0000 mL | INTRAVENOUS | Status: DC | PRN

## 2013-06-28 MED ORDER — OXYCODONE HCL 5 MG PO TABS
ORAL_TABLET | ORAL | Status: AC
  Administered 2013-06-28: 10 mg via ORAL
  Filled 2013-06-28: qty 2

## 2013-06-28 MED ORDER — LIDOCAINE-EPINEPHRINE 0.5 %-1:200000 IJ SOLN
INTRAMUSCULAR | Status: AC
  Filled 2013-06-28: qty 1

## 2013-06-28 MED ORDER — MIDAZOLAM HCL 5 MG/5ML IJ SOLN
INTRAMUSCULAR | Status: DC | PRN
  Administered 2013-06-28: 2 mg via INTRAVENOUS

## 2013-06-28 MED ORDER — NEPRO/CARBSTEADY PO LIQD: 237.0000 mL | ORAL | Status: DC | PRN

## 2013-06-28 MED ORDER — PENTAFLUOROPROP-TETRAFLUOROETH EX AERO: 1.0000 "application " | INHALATION_SPRAY | CUTANEOUS | Status: DC | PRN

## 2013-06-28 MED ORDER — LIDOCAINE HCL (PF) 1 % IJ SOLN: 5.0000 mL | INTRAMUSCULAR | Status: DC | PRN

## 2013-06-28 MED ORDER — ALTEPLASE 2 MG IJ SOLR: 2.0000 mg | Freq: Once | INTRAMUSCULAR | Status: DC | PRN

## 2013-06-28 MED ORDER — LIDOCAINE-PRILOCAINE 2.5-2.5 % EX CREA: 1.0000 "application " | TOPICAL_CREAM | CUTANEOUS | Status: DC | PRN

## 2013-06-28 MED ORDER — OXYCODONE HCL 5 MG PO TABS
5.0000 mg | ORAL_TABLET | ORAL | Status: DC | PRN
  Administered 2013-06-28 (×2): 10 mg via ORAL
  Administered 2013-06-29: 5 mg via ORAL
  Filled 2013-06-28: qty 1
  Filled 2013-06-28: qty 2

## 2013-06-28 MED ORDER — CEFAZOLIN SODIUM-DEXTROSE 2-3 GM-% IV SOLR
INTRAVENOUS | Status: DC | PRN
  Administered 2013-06-28: 2 g via INTRAVENOUS

## 2013-06-28 MED ORDER — HEPARIN SODIUM (PORCINE) 1000 UNIT/ML DIALYSIS: 1000.0000 [IU] | INTRAMUSCULAR | Status: DC | PRN

## 2013-06-28 MED ORDER — HEPARIN SODIUM (PORCINE) 1000 UNIT/ML IJ SOLN
INTRAMUSCULAR | Status: AC
  Filled 2013-06-28: qty 1

## 2013-06-28 MED ORDER — LIDOCAINE HCL (CARDIAC) 10 MG/ML IV SOLN
INTRAVENOUS | Status: DC | PRN
  Administered 2013-06-28: 50 mg via INTRAVENOUS

## 2013-06-28 SURGICAL SUPPLY — 59 items
APL SKNCLS STERI-STRIP NONHPOA (GAUZE/BANDAGES/DRESSINGS) ×2
ARMBAND PINK RESTRICT EXTREMIT (MISCELLANEOUS) ×3 IMPLANT
BAG DECANTER FOR FLEXI CONT (MISCELLANEOUS) ×3 IMPLANT
BENZOIN TINCTURE PRP APPL 2/3 (GAUZE/BANDAGES/DRESSINGS) ×3 IMPLANT
CANISTER SUCTION 2500CC (MISCELLANEOUS) ×3 IMPLANT
CATH CANNON HEMO 15F 50CM (CATHETERS) IMPLANT
CATH CANNON HEMO 15FR 19 (HEMODIALYSIS SUPPLIES) IMPLANT
CATH CANNON HEMO 15FR 23CM (HEMODIALYSIS SUPPLIES) IMPLANT
CATH CANNON HEMO 15FR 31CM (HEMODIALYSIS SUPPLIES) IMPLANT
CATH CANNON HEMO 15FR 32 (HEMODIALYSIS SUPPLIES) IMPLANT
CATH CANNON HEMO 15FR 32CM (HEMODIALYSIS SUPPLIES) ×3 IMPLANT
CLIP LIGATING EXTRA MED SLVR (CLIP) ×3 IMPLANT
CLIP LIGATING EXTRA SM BLUE (MISCELLANEOUS) ×3 IMPLANT
COVER PROBE W GEL 5X96 (DRAPES) ×3 IMPLANT
COVER SURGICAL LIGHT HANDLE (MISCELLANEOUS) ×3 IMPLANT
DECANTER SPIKE VIAL GLASS SM (MISCELLANEOUS) ×3 IMPLANT
DRAPE C-ARM 42X72 X-RAY (DRAPES) ×3 IMPLANT
DRAPE CHEST BREAST 15X10 FENES (DRAPES) ×3 IMPLANT
ELECT REM PT RETURN 9FT ADLT (ELECTROSURGICAL) ×3
ELECTRODE REM PT RTRN 9FT ADLT (ELECTROSURGICAL) ×2 IMPLANT
GAUZE SPONGE 2X2 8PLY STRL LF (GAUZE/BANDAGES/DRESSINGS) ×2 IMPLANT
GAUZE SPONGE 4X4 16PLY XRAY LF (GAUZE/BANDAGES/DRESSINGS) ×3 IMPLANT
GEL ULTRASOUND 20GR AQUASONIC (MISCELLANEOUS) IMPLANT
GLOVE BIOGEL PI IND STRL 7.0 (GLOVE) IMPLANT
GLOVE BIOGEL PI INDICATOR 7.0 (GLOVE) ×2
GLOVE ECLIPSE 6.5 STRL STRAW (GLOVE) ×2 IMPLANT
GLOVE SS BIOGEL STRL SZ 7.5 (GLOVE) ×2 IMPLANT
GLOVE SUPERSENSE BIOGEL SZ 7.5 (GLOVE) ×1
GOWN STRL NON-REIN LRG LVL3 (GOWN DISPOSABLE) ×9 IMPLANT
KIT BASIN OR (CUSTOM PROCEDURE TRAY) ×3 IMPLANT
KIT ROOM TURNOVER OR (KITS) ×3 IMPLANT
NDL 18GX1X1/2 (RX/OR ONLY) (NEEDLE) ×2 IMPLANT
NDL HYPO 25GX1X1/2 BEV (NEEDLE) ×2 IMPLANT
NEEDLE 18GX1X1/2 (RX/OR ONLY) (NEEDLE) ×3 IMPLANT
NEEDLE 22X1 1/2 (OR ONLY) (NEEDLE) ×3 IMPLANT
NEEDLE HYPO 25GX1X1/2 BEV (NEEDLE) ×3 IMPLANT
NS IRRIG 1000ML POUR BTL (IV SOLUTION) ×3 IMPLANT
PACK CV ACCESS (CUSTOM PROCEDURE TRAY) ×3 IMPLANT
PACK SURGICAL SETUP 50X90 (CUSTOM PROCEDURE TRAY) ×3 IMPLANT
PAD ARMBOARD 7.5X6 YLW CONV (MISCELLANEOUS) ×6 IMPLANT
SOAP 2 % CHG 4 OZ (WOUND CARE) ×3 IMPLANT
SPONGE GAUZE 2X2 STER 10/PKG (GAUZE/BANDAGES/DRESSINGS) ×1
SPONGE GAUZE 4X4 12PLY (GAUZE/BANDAGES/DRESSINGS) ×4 IMPLANT
STRIP CLOSURE SKIN 1/2X4 (GAUZE/BANDAGES/DRESSINGS) ×3 IMPLANT
SUT ETHILON 3 0 PS 1 (SUTURE) ×3 IMPLANT
SUT PROLENE 6 0 CC (SUTURE) ×3 IMPLANT
SUT VIC AB 3-0 SH 27 (SUTURE) ×3
SUT VIC AB 3-0 SH 27X BRD (SUTURE) ×2 IMPLANT
SUT VICRYL 4-0 PS2 18IN ABS (SUTURE) ×3 IMPLANT
SYR 20CC LL (SYRINGE) ×3 IMPLANT
SYR 30ML LL (SYRINGE) IMPLANT
SYR 5ML LL (SYRINGE) ×6 IMPLANT
SYR CONTROL 10ML LL (SYRINGE) ×3 IMPLANT
SYRINGE 10CC LL (SYRINGE) ×3 IMPLANT
TAPE CLOTH SURG 4X10 WHT LF (GAUZE/BANDAGES/DRESSINGS) ×2 IMPLANT
TOWEL OR 17X24 6PK STRL BLUE (TOWEL DISPOSABLE) ×3 IMPLANT
TOWEL OR 17X26 10 PK STRL BLUE (TOWEL DISPOSABLE) ×3 IMPLANT
UNDERPAD 30X30 INCONTINENT (UNDERPADS AND DIAPERS) ×3 IMPLANT
WATER STERILE IRR 1000ML POUR (IV SOLUTION) ×3 IMPLANT

## 2013-06-28 NOTE — Anesthesia Preprocedure Evaluation (Signed)
Anesthesia Evaluation  Patient identified by MRN, date of birth, ID band Patient awake    Reviewed: Allergy & Precautions, H&P , NPO status , Patient's Chart, lab work & pertinent test results  Airway Mallampati: II TM Distance: >3 FB Neck ROM: Full    Dental no notable dental hx. (+) Partial Upper, Partial Lower and Dental Advisory Given   Pulmonary neg pulmonary ROS,  breath sounds clear to auscultation  Pulmonary exam normal       Cardiovascular hypertension, On Medications Rhythm:Regular Rate:Normal     Neuro/Psych negative neurological ROS  negative psych ROS   GI/Hepatic negative GI ROS, Neg liver ROS,   Endo/Other  diabetes, Type 2, Oral Hypoglycemic Agents  Renal/GU ESRFRenal disease  negative genitourinary   Musculoskeletal   Abdominal   Peds  Hematology negative hematology ROS (+)   Anesthesia Other Findings   Reproductive/Obstetrics negative OB ROS                           Anesthesia Physical Anesthesia Plan  ASA: III  Anesthesia Plan: MAC   Post-op Pain Management:    Induction: Intravenous  Airway Management Planned: Simple Face Mask  Additional Equipment:   Intra-op Plan:   Post-operative Plan:   Informed Consent: I have reviewed the patients History and Physical, chart, labs and discussed the procedure including the risks, benefits and alternatives for the proposed anesthesia with the patient or authorized representative who has indicated his/her understanding and acceptance.   Dental advisory given  Plan Discussed with: CRNA  Anesthesia Plan Comments:         Anesthesia Quick Evaluation

## 2013-06-28 NOTE — OR Nursing (Signed)
Procedure #1 ended 1054

## 2013-06-28 NOTE — Op Note (Signed)
OPERATIVE REPORT  DATE OF SURGERY: 06/28/2013  PATIENT: Gary Lang, 62 y.o. male MRN: [id number]  DOB: [birth year]  PRE-OPERATIVE DIAGNOSIS: End-stage renal disease  POST-OPERATIVE DIAGNOSIS:  Same  PROCEDURE: #1 left Cimino AV fistula, #2 right IJ hemodialysis catheter  SURGEON:  Curt Jews, M.D.  PHYSICIAN ASSISTANT: Nurse  ANESTHESIA:  Local with sedation  EBL: Minimal ml  Total I/O In: 200 [I.V.:200] Out: 30 [Blood:30]  BLOOD ADMINISTERED: None  DRAINS: None  SPECIMEN: None  COUNTS CORRECT:  YES  PLAN OF CARE: PACU with chest x-ray pending   PATIENT DISPOSITION:  PACU - hemodynamically stable  PROCEDURE DETAILS: The patient was taken up and placed supine position where the area of the left arm was prepped and draped in the sterile fashion. Using SonoSite ultrasound the level of the cephalic vein was identified and marked on the skin. Using local anesthesia incision was made between the level of the cephalic vein and radial artery at the wrist. The cephalic vein was mobilized and stricture branches were ligated with 4-0 silk ties and divided. The vein was ligated distally and divided it was gently dilated and was felt to be good caliber for fistula creation. The radial artery was exposed through the same incision and was of good caliber. The artery was occluded proximally and distally was opened with an 11 blade and extended longitudinally with Potts scissors. The vein was cut to appropriate length and spatulated and sewn end-to-side to the artery with a running 6-0 Prolene suture. Clamps removed and good thrill was noted. A sinusitis patient did have a large branch above the anastomosis and this separate incision was made over this using local anesthesia this branch was ligated. The wounds were irrigated with saline. Hemostasis obtained electrocautery the wounds were closed with 3-0 Vicryl in the subcutaneous and subcuticular tissue. Benzoin Steri-Strips were applied. Next  attention was turned to the right and left neck which were imaged with ultrasound revealing patent jugular veins bilaterally. The patient was placed in Trendelenburg position and using local anesthesia and a finder needle the right internal jugular vein was accessed. Next using Seldinger technique an 18-gauge needle was used to access the vein and a guidewire was passed centrally and this was confirmed with fluoroscopy. A dilator and peel-away sheath was passed over the guidewire and the dilator and guidewire removed. A 27 cm hemodialysis cath was passed through the catheter and the tips were positioned the distal right atrium. The catheter was brought through subcutaneous tunnel through a separate stab incision. A 2 lm ports were attached in both lumens flushed and aspirated easily reluctant without unit per cc heparin. The catheter was secured to the skin with a 3-0 nylon stitch and the entry site was closed with 4 subcuticular Vicryl stitch. Patient was taken to the recovery room where chest x-ray is pending   Curt Jews, M.D. 06/28/2013 11:33 AM

## 2013-06-28 NOTE — Progress Notes (Signed)
Family Medicine Teaching Service Daily Progress Note Intern Pager: [phone]  Patient name: Gary Lang Medical record number: [id number] Date of birth: July 27, 1951 Age: 62 y.o. Gender: male  Primary Care Provider: Jeri Modena Consultants: Nephrology, Vascular surgery Code Status: Full  Pt Overview and Major Events to Date:  10/18: IV Solumedrol started 10/19: 24hr urine with 12-18g proteinuria 10/20: Percutaneous renal Bx 10/22: Awaiting Bx results 10/23: Bx: FSGS, started on prednisone 60mg   10/27: AVF and PAC placed for temporary HD  Assessment and Plan: Gary Lang is a 62 y.o.admitted for anasarca and AKI. PMH is significant for rheumatoid arthritis, HTN, and T2DM.   # FSGS with nephrotic proteinuria - Collapsing idiopathic FSGS on renal biopsy.  - Holding diuretics - Prednisone 60mg  po daily (10/23-) likely for 2 months. (titrating insulin, added PPI ppx) - POD #1 s/p AVF CREATION- Left arm with ultrasound guidance (Left) and INSERTION OF DIALYSIS CATHETER in R IJ - Receiving HD this AM successfully after poor flow on 1st attempt yesterday.   # T2DM: A1c 9.4 on metformin alone.  - Hold metformin   - Re-starting lantus with resistant SSI - Received diabetic education, will be sent home on insulins as he will be on steroids long-term.   # AKI/nephrotic syndrome: Creatinine stable >7, with ATN on biopsy, hopefully somewhat reversible, due to massive proteinuria - Holding abatacept, metformin and lisinopril.  # Hypoalbuminemia due to nephrotic syndrome  - Worsening albumin 1.9 > 0.9 >(Received 250cc albumin 10/24)> 1.2  # Hyperlipidemia due to nephrotic syndrome - LDL 250, high-intensity statin begun [id number]  # Metabolic acidosis: due to above, improved - Bicarb was started 10/21: 19  # Hyperphosphatemia: binders started  - Phoslo 2,001 mg TID: Improving 9.7 > 8.8  #HTN. Holding lisinopril, continuing HCTZ and clonidine - BP stable.   #Rheumatoid  arthritis: no recent flares and excellent control on abatacept.  - Holding abatacept 125mg  IM qMondays  #Anemia of chronic disease:  - Hgb trending up. 11.5 on 10/21 - Recheck CBC tomorrow AM  FEN/GI: NPO until discharge.  Prophylaxis: Hep [place] TID, PPI  Disposition: Continue to monitor after HD, repeat HD tomorrow, pending dialysis placement.   Subjective: Doing well overnight after surgery. First attempt at HD was unsuccessful, but patient is resting quietly in no pain at HD this AM. No CP/SOB  Objective: Temp:  [97.8 F (36.6 C)-98.1 F (36.7 C)] 98.1 F (36.7 C) (10/27 0553) Pulse Rate:  [57-63] 57 (10/27 0553) Resp:  [18-19] 18 (10/27 0553) BP: (143-161)/(71-77) 143/74 mmHg (10/27 0553) SpO2:  [98 %-100 %] 99 % (10/27 0553) Weight:  [253 lb 3.2 oz (114.851 kg)] 253 lb 3.2 oz (114.851 kg) (10/26 2033)  Filed Weights   06/24/13 2143 06/26/13 2052 06/27/13 2033  Weight: 253 lb 3.2 oz (114.851 kg) 253 lb 3.2 oz (114.851 kg) 253 lb 3.2 oz (114.851 kg)   Physical Exam: General: 62 yo male sitting in bed eating breakfast Cardiovascular: RRR, soft II/VI systolic murmur Respiratory: Nonlabored, CTAB on room air Abdomen: NABS, NT, ND Extremities: diffuse 3+ pitting edema of UE and LE, nontender, 2+ pulses. L wrist AVF c/d/i with palpable thrill.  Skin: Right upper chest with bandage c/d/i with catheter in place.  Laboratory:  Recent Labs Lab 06/21/13 1621 06/22/13 0440  WBC 8.8 9.8  HGB 10.3* 11.5*  HCT 29.1* 31.9*  PLT 264 339    Recent Labs Lab 06/25/13 0626 06/26/13 0845 06/27/13 0550  NA 131* 130* 133*  K 4.1 3.6 3.0*  CL 98 96 98  CO2 13* 17* 17*  BUN 107* 114* 117*  CREATININE 7.41* 7.76* 7.70*  CALCIUM 7.2* 7.7* 7.5*  GLUCOSE 247* 296* 262*     Recent Labs Lab 06/27/13 0742 06/27/13 1158 06/27/13 1636 06/27/13 2036 06/28/13 0736  GLUCAP 214* 238* 255* 321* 213*    06/18/2013 13:11  Color, Urine YELLOW  APPearance CLOUDY (A)  Specific  Gravity, Urine 1.022  pH 5.5  Glucose 250 (A)  Bilirubin Urine NEGATIVE  Ketones, ur NEGATIVE  Protein >300 (A)  Urobilinogen, UA 0.2  Nitrite NEGATIVE  Leukocytes, UA NEGATIVE  Hgb urine dipstick MODERATE (A)  WBC, UA 3-6  RBC / HPF 0-2  Squamous Epithelial / LPF FEW (A)  Bacteria, UA FEW (A)  Casts GRANULAR CAST (A)   PT/INR: 13.7/1.07 ANA: Neg ASO: < 25 GBM Ab: < 1 MPO Ab: < 1 Ser Protease-3: 1 C3: 199 C4: 44 Alpha-1-globulin: 6.8 Alpha-2-globulin: 25.8 Heb B SAg: Neg HCV Ab: Neg Hep B: negative HIV: Non-reactive U Protein: Creatinine = 12.41 24hr protein: 18.690g  UCx 10/17: 50k colonies of multiple bacterial morphotypes, none predominant   06/19/2013 05:38  Cholesterol 325 (H)  Triglycerides 145  HDL 46  LDL (calc) 250 (H)  VLDL 29  Total CHOL/HDL Ratio 7.1   Imaging/Diagnostic Tests:   ULTRASOUND-GUIDED CORE BIOPSY OF LEFT KIDNEY 10/20 FINDINGS:  Both kidneys were visible from a posterior flank approach. The lower  pole cortex of the left kidney was felt to be slightly more visible  than the right kidney. Solid and intact core biopsy samples were  obtained. There was no immediate ultrasound evidence of a bleeding  complication.  IMPRESSION:  Ultrasound-guided core biopsy performed of the left kidney.  LIMITED RENAL/URINARY TRACT ULTRASOUND COMPLETE  COMPARISON: Prior renal ultrasound 06/10/2013  FINDINGS:  Right Kidney  Length: 12.4 cm. Echogenic renal parenchyma. No hydronephrosis. No  solid renal mass.  Left Kidney  Length: 12.7 cm. Echogenic renal parenchyma. No hydronephrosis. 3.6  x 3.1 cm simple cyst exophytic from the upper pole of the kidney.  IMPRESSION:  1. The lower pole of the right kidney is approachable for  percutaneous biopsy.  2. Echogenic renal parenchyma suggests underlying medical renal  disease.  3. Negative for hydronephrosis.  06/10/2013  IMPRESSION: Medical renal disease changes in both kidneys. 3.6 cm diameter left  renal cyst. Vague hypoechoic area within left kidney, potentially artifact but difficult completely exclude mass. Consider non contrast MR imaging (non contrast due to renal failure) to exclude solid left renal mass lesion.   Vance Gather, MD 06/28/2013, 8:03 AM PGY-1, [place] Intern pager: [phone], text pages welcome

## 2013-06-28 NOTE — Progress Notes (Signed)
Assessment/Plan:  1. AKI/nephrotic syndrome; possible ESRD. (Labs from 12/20/12 revealed creatinine of 1.24mg /dl) s/p PC placement and AVG/AVF by Dr. Donnetta Hutching.  First dialysis today. Started on prednisone 60mg  qd. Will hold diuretics. 2. HTN- stable, now on dilt to help with proteinuria and HTN 3. GI prophylaxis- start on protonix 20mg  qd d/t pred 4. Metabolic acidosis- due to #1. Started dialysis. Will stop bicarb. 5. Hyperphosphatemia- will increase binders, phoslo 667 to 3 qac tid and follow. 6. DM- metformin on hold. Plan per primary svc 7. RA- agree with holding abatacept for now.  8. ACDz- follow h/h after biopsy   Subjective: Interval History: Access placed today.    Objective: Vital signs in last 24 hours: Temp:  [97.5 F (36.4 C)-98.1 F (36.7 C)] 98 F (36.7 C) (10/27 1325) Pulse Rate:  [57-71] 63 (10/27 1308) Resp:  [11-24] 11 (10/27 1308) BP: (126-161)/(58-94) 136/77 mmHg (10/27 1308) SpO2:  [95 %-100 %] 95 % (10/27 1308) Weight:  [114.851 kg (253 lb 3.2 oz)] 114.851 kg (253 lb 3.2 oz) (10/26 2033) Weight change: 0 kg (0 lb)  Intake/Output from previous day: 10/26 0701 - 10/27 0700 In: 960 [P.O.:780; IV Piggyback:180] Out: 1875 [Urine:1875] Intake/Output this shift: Total I/O In: 200 [I.V.:200] Out: 30 [Blood:30]  General appearance: alert and appears stated age Resp: clear to auscultation bilaterally Chest wall: no tenderness Cardio: regular rate and rhythm, S1, S2 normal, no murmur, click, rub or gallop GI: 2-3+ edema Extremities: 2-3+ edema  Lab Results: No results found for this basename: WBC, HGB, HCT, PLT,  in the last 72 hours BMET:  Recent Labs  06/26/13 0845 06/27/13 0550  NA 130* 133*  K 3.6 3.0*  CL 96 98  CO2 17* 17*  GLUCOSE 296* 262*  BUN 114* 117*  CREATININE 7.76* 7.70*  CALCIUM 7.7* 7.5*   No results found for this basename: PTH,  in the last 72 hours Iron Studies: No results found for this basename: IRON, TIBC, TRANSFERRIN,  FERRITIN,  in the last 72 hours Studies/Results: Dg Chest 1 View  06/28/2013   CLINICAL DATA:  Postop  EXAM: CHEST - 1 VIEW  COMPARISON:  None.  FINDINGS: A tunneled right internal jugular vein dialysis catheter has been placed. Tip is at the cavoatrial junction and right atrium. Lungs are under aerated. No pneumothorax. Opacity at the left base likely represents cardiomegaly and volume loss.  IMPRESSION: Right internal jugular vein tunneled dialysis catheter placement with its tip at the cavoatrial junction and no pneumothorax.   Electronically Signed   By: Maryclare Bean M.D.   On: 06/28/2013 12:23    Scheduled: . alteplase  4 mg Intracatheter Once  . atorvastatin  80 mg Oral q1800  . calcium acetate  2,001 mg Oral TID WC  . cloNIDine  0.2 mg Oral BID  . diltiazem  180 mg Oral Daily  . docusate sodium  100 mg Oral BID  . furosemide  100 mg Intravenous TID  . heparin subcutaneous  5,000 Units Subcutaneous Q8H  . insulin aspart  0-20 Units Subcutaneous TID WC  . insulin glargine  3 Units Subcutaneous Daily  . metolazone  2.5 mg Oral Daily  . pantoprazole  20 mg Oral Daily  . predniSONE  60 mg Oral Q breakfast  . sodium bicarbonate  1,300 mg Oral BID  . sodium chloride  3 mL Intravenous Q12H    LOS: 10 days   Dionysios Massman C 06/28/2013,3:20 PM

## 2013-06-28 NOTE — Telephone Encounter (Addendum)
Message copied by Gena Fray on Mon Jun 28, 2013  4:02 PM ------      Message from: Alfonso Patten      Created: Mon Jun 28, 2013  1:42 PM       Eliane Decree had left Cimino AV fistula made to see me in one month ------  Lm and sent letter, dpm

## 2013-06-28 NOTE — Interval H&P Note (Signed)
History and Physical Interval Note:  06/28/2013 7:14 AM  Gary Lang  has presented today for surgery, with the diagnosis of End Stage Renal Disease  The various methods of treatment have been discussed with the patient and family. After consideration of risks, benefits and other options for treatment, the patient has consented to  Procedure(s): ARTERIOVENOUS (AV) FISTULA CREATION- laterality unknown at time of posting (N/A) INSERTION OF DIALYSIS CATHETER (N/A) as a surgical intervention .  The patient's history has been reviewed, patient examined, no change in status, stable for surgery.  I have reviewed the patient's chart and labs.  Questions were answered to the patient's satisfaction.     Wendall Isabell

## 2013-06-28 NOTE — Progress Notes (Signed)
Pt was unable to run on hemodialysis today d/t venous side of catheter with very sluggish flush and aspiration noted. Attempted TPA dwell x1hour as per Dr Abel Presto order with no results.  Notified Dr Florene Glen who ask me to notify Dr Early who placed the catheter earlier today.  Dr Donnetta Hutching states to let catheter rest overnight and try running him again tomorrow. If it still doesn't work then he will reassess the catheter placement.

## 2013-06-28 NOTE — Preoperative (Signed)
Beta Blockers   Reason not to administer Beta Blockers:Not Applicable 

## 2013-06-28 NOTE — H&P (View-Only) (Signed)
[facility]  Patient name: Gary Lang MRN: [id number] DOB: 20-Oct-1950 Sex: male  REASON FOR CONSULT: May need for hemodialysis access. Consult from Dr. Bonner Puna.   HPI: Gary Lang is a 62 y.o. male who was admitted on 06/18/2013 with arm and leg swelling. The patient was admitted with acute renal failure. The patient had developed the fairly acute onset of edema with a 30 pound weight gain over the last several weeks. He was evaluated by the nephrologist and felt to have acute renal failure probably secondary to nephrotic syndrome.  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 He is being treated with steroids.   I discussed the case with Dr. Marval Regal. He would like [place] to plan on placement of an AV fistula on Monday. If his kidney function improved significantly then made tensely cancel the case. If his kidney function worsens he may require additionally the placement of a dialysis catheter.  He has had progressive swelling and generalized edema. He denies any anorexia, nausea, fatigue, or palpitations.  Past Medical History  Diagnosis Date  . Hypertension   . Diabetes mellitus without complication   . Rheumatoid arthritis    Family History  Problem Relation Age of Onset  . Cancer Mother    SOCIAL HISTORY: History  Substance Use Topics  . Smoking status: Former Smoker -- 0.50 packs/day for 10 years    Types: Cigarettes    Quit date: 09/02/1976  . Smokeless tobacco: Never Used  . Alcohol Use: No   Allergies  Allergen Reactions  . Glyburide   . Methotrexate Derivatives Other (See Comments)    Patient cannot remember   Current Facility-Administered Medications  Medication Dose Route Frequency Provider Last Rate Last Dose  . 0.9 %  sodium chloride infusion  250 mL Intravenous PRN Waldemar Dickens, MD      . acetaminophen (TYLENOL) tablet 1,000 mg  1,000 mg Oral TID PRN Waldemar Dickens, MD       . atorvastatin (LIPITOR) tablet 80 mg  80 mg Oral q1800 Vance Gather, MD   80 mg at 06/24/13 1751  . calcium acetate (PHOSLO) capsule 2,001 mg  2,001 mg Oral TID WC Donetta Potts, MD   2,001 mg at 06/25/13 1216  . cloNIDine (CATAPRES) tablet 0.2 mg  0.2 mg Oral BID Lucrezia Starch, MD   0.2 mg at 06/25/13 1041  . diltiazem (CARDIZEM CD) 24 hr capsule 180 mg  180 mg Oral Daily Donetta Potts, MD   180 mg at 06/25/13 1042  . docusate (COLACE) 50 MG/5ML liquid 100 mg  100 mg Oral BID Donetta Potts, MD   100 mg at 06/24/13 2140  . furosemide (LASIX) 100 mg in dextrose 5 % 50 mL IVPB  100 mg Intravenous TID Hilton Sinclair, MD   100 mg at 06/25/13 1054  . heparin injection 5,000 Units  5,000 Units Subcutaneous Q8H Lind Covert, MD   5,000 Units at 06/25/13 0539  . insulin aspart (novoLOG) injection 0-20 Units  0-20 Units Subcutaneous TID WC Vance Gather, MD   11 Units at 06/25/13 1216  . [START ON 06/26/2013] insulin glargine (LANTUS) injection 10 Units  10 Units Subcutaneous Daily Vance Gather, MD      . insulin glargine (LANTUS) injection 5 Units  5 Units Subcutaneous Once Vance Gather, MD      . metolazone (ZAROXOLYN) tablet 2.5 mg  2.5 mg  Oral Daily Vance Gather, MD      . ondansetron [facility]) tablet 4 mg  4 mg Oral Q6H PRN Waldemar Dickens, MD       Or  . ondansetron [facility]) injection 4 mg  4 mg Intravenous Q6H PRN Waldemar Dickens, MD      . pantoprazole (PROTONIX) EC tablet 20 mg  20 mg Oral Daily Donetta Potts, MD      . predniSONE (DELTASONE) tablet 60 mg  60 mg Oral Q breakfast Donetta Potts, MD   60 mg at 06/25/13 1041  . senna (SENOKOT) tablet 17.2 mg  2 tablet Oral Daily PRN Vance Gather, MD      . sodium bicarbonate tablet 1,300 mg  1,300 mg Oral BID Donetta Potts, MD   1,300 mg at 06/25/13 1042  . sodium chloride 0.9 % injection 3 mL  3 mL Intravenous Q12H Waldemar Dickens, MD   3 mL at 06/25/13 1043  . sodium chloride 0.9 % injection 3 mL  3 mL  Intravenous PRN Waldemar Dickens, MD       REVIEW OF SYSTEMS: Valu.Nieves ] denotes positive finding; [  ] denotes negative finding CARDIOVASCULAR:  [ ]  chest pain   [ ]  chest pressure   [ ]  palpitations   [ ]  orthopnea   [ ]  dyspnea on exertion   [ ]  claudication   [ ]  rest pain   [ ]  DVT   [ ]  phlebitis PULMONARY:   [ ]  productive cough   [ ]  asthma   [ ]  wheezing NEUROLOGIC:   [ ]  weakness  [ ]  paresthesias  [ ]  aphasia  [ ]  amaurosis  [ ]  dizziness HEMATOLOGIC:   [ ]  bleeding problems   [ ]  clotting disorders MUSCULOSKELETAL:  [ ]  joint pain   [ ]  joint swelling [ ]  leg swelling GASTROINTESTINAL: [ ]   blood in stool  [ ]   hematemesis GENITOURINARY:  [ ]   dysuria  [ ]   hematuria PSYCHIATRIC:  [ ]  history of major depression INTEGUMENTARY:  [ ]  rashes  [ ]  ulcers CONSTITUTIONAL:  [ ]  fever   [ ]  chills  PHYSICAL EXAM: Filed Vitals:   06/24/13 1355 06/24/13 2143 06/25/13 0718 06/25/13 0928  BP: 139/66 158/83 138/74 131/69  Pulse: 67 72 62 62  Temp: 98 F (36.7 C) 98.7 F (37.1 C) 98.2 F (36.8 C) 97.9 F (36.6 C)  TempSrc: Oral Oral Oral   Resp: 18 19 20 18   Height:  6' 1.5" (1.867 m)    Weight:  253 lb 3.2 oz (114.851 kg)    SpO2: 98% 94% 94% 100%   Body mass index is 32.95 kg/(m^2). GENERAL: The patient is a well-nourished male, in no acute distress. The vital signs are documented above. CARDIOVASCULAR: There is a regular rate and rhythm. I do not detect carotid bruits. He has palpable radial pulses bilaterally. PULMONARY: There is good air exchange bilaterally without wheezing or rales. ABDOMEN: Soft and non-tender with normal pitched bowel sounds.  MUSCULOSKELETAL: There are no major deformities or cyanosis. NEUROLOGIC: No focal weakness or paresthesias are detected. SKIN: There are no ulcers or rashes noted. PSYCHIATRIC: The patient has a normal affect.  DATA:  Lab Results  Component Value Date   WBC 9.8 06/22/2013   HGB 11.5* 06/22/2013   HCT 31.9* 06/22/2013   MCV 78.8  06/22/2013   PLT 339 06/22/2013   Lab Results  Component Value Date   NA  131* 06/25/2013   K 4.1 06/25/2013   CL 98 06/25/2013   CO2 13* 06/25/2013   Lab Results  Component Value Date   CREATININE 7.41* 06/25/2013   Lab Results  Component Value Date   INR 1.07 06/20/2013   Lab Results  Component Value Date   HGBA1C 9.4* 06/19/2013   CBG (last 3)   Recent Labs  06/25/13 0635 06/25/13 0722 06/25/13 1129  GLUCAP 255* 257* 262*   I have independently interpreted his vein mapping. The forearm and upper arm cephalic vein in both upper extremities appear to be reasonable for an AV fistula.  MEDICAL ISSUES: ACUTE RENAL FAILURE: We have been asked to proceed with placement of an AV fistula on Monday. I have reviewed his vein map to given that he is right-handed, and has an IV in the right arm, we'll plan on placement of a left radial cephalic or left brachiocephalic AV fistula. If his kidney function worsens then he could potentially place a tunneled dialysis catheter at the same time. If his kidney function improved significantly and we may be asked to hold off on placement of an AV fistula. I have discussed the indications for the procedure and the potential complications including but limited to bleeding, wound healing problems, failure of the fistula to mature, or the need for further work such as venoplasty or branch ligation. All his questions were answered he is agreeable to proceed.  [place] [facility] [place]: [phone]

## 2013-06-28 NOTE — Progress Notes (Signed)
Utilization review completed.  

## 2013-06-28 NOTE — Transfer of Care (Signed)
Immediate Anesthesia Transfer of Care Note  Patient: Gary Lang  Procedure(s) Performed: Procedure(s): ARTERIOVENOUS (AV) FISTULA CREATION- Left arm with ultrasound guidance (Left) INSERTION OF DIALYSIS CATHETER (N/A)  Patient Location: PACU  Anesthesia Type:MAC  Level of Consciousness: awake  Airway & Oxygen Therapy: Patient Spontanous Breathing  Post-op Assessment: Report given to PACU RN and Post -op Vital signs reviewed and stable  Post vital signs: Reviewed and stable  Complications: No apparent anesthesia complications

## 2013-06-28 NOTE — Anesthesia Postprocedure Evaluation (Signed)
  Anesthesia Post-op Note  Patient: Gary Lang  Procedure(s) Performed: Procedure(s): ARTERIOVENOUS (AV) FISTULA CREATION- Left arm with ultrasound guidance (Left) INSERTION OF DIALYSIS CATHETER (N/A)  Patient Location: PACU  Anesthesia Type: MAC  Level of Consciousness: awake and alert   Airway and Oxygen Therapy: Patient Spontanous Breathing  Post-op Pain: none  Post-op Assessment: Post-op Vital signs reviewed, Patient's Cardiovascular Status Stable and Respiratory Function Stable  Post-op Vital Signs: Reviewed  Filed Vitals:   06/28/13 1215  BP:   Pulse: 63  Temp:   Resp: 16    Complications: No apparent anesthesia complications

## 2013-06-29 ENCOUNTER — Encounter (HOSPITAL_COMMUNITY): Payer: Self-pay | Admitting: Vascular Surgery

## 2013-06-29 LAB — RENAL FUNCTION PANEL
Albumin: 1.1 g/dL — ABNORMAL LOW (ref 3.5–5.2)
BUN: 118 mg/dL — ABNORMAL HIGH (ref 6–23)
CO2: 19 mEq/L (ref 19–32)
Calcium: 7.4 mg/dL — ABNORMAL LOW (ref 8.4–10.5)
Chloride: 98 mEq/L (ref 96–112)
Creatinine, Ser: 7.32 mg/dL — ABNORMAL HIGH (ref 0.50–1.35)
GFR calc Af Amer: 8 mL/min — ABNORMAL LOW (ref >90)
GFR calc non Af Amer: 7 mL/min — ABNORMAL LOW (ref >90)
Phosphorus: 9.2 mg/dL — ABNORMAL HIGH (ref 2.3–4.6)

## 2013-06-29 LAB — GLUCOSE, CAPILLARY

## 2013-06-29 NOTE — Progress Notes (Signed)
Subjective: Interval History: none.. minimal soreness and left wrist  Objective: Vital signs in last 24 hours: Temp:  [97.5 F (36.4 C)-98.9 F (37.2 C)] 98.9 F (37.2 C) (10/28 0616) Pulse Rate:  [63-77] 77 (10/28 0616) Resp:  [11-24] 18 (10/28 0616) BP: (126-200)/(58-105) 154/73 mmHg (10/28 0616) SpO2:  [92 %-99 %] 95 % (10/28 0616) Weight:  [255 lb 15.3 oz (116.1 kg)-256 lb 2.8 oz (116.2 kg)] 255 lb 15.3 oz (116.1 kg) (10/27 1630)  Intake/Output from previous day: 10/27 0701 - 10/28 0700 In: 900 [P.O.:600; I.V.:300] Out: 1030 [Urine:1100; Blood:30] Intake/Output this shift: Total I/O In: -  Out: 250 [Urine:250]  Left wrist incision healing nicely. Excellent thrill.  Lab Results: No results found for this basename: WBC, HGB, HCT, PLT,  in the last 72 hours BMET  Recent Labs  06/28/13 2225 06/29/13 0430  NA 130* 134*  K 2.9* 3.1*  CL 95* 98  CO2 19 19  GLUCOSE 255* 200*  BUN 119* 118*  CREATININE 7.22* 7.32*  CALCIUM 7.6* 7.4*    Studies/Results: Dg Chest 1 View  06/28/2013   CLINICAL DATA:  Postop  EXAM: CHEST - 1 VIEW  COMPARISON:  None.  FINDINGS: A tunneled right internal jugular vein dialysis catheter has been placed. Tip is at the cavoatrial junction and right atrium. Lungs are under aerated. No pneumothorax. Opacity at the left base likely represents cardiomegaly and volume loss.  IMPRESSION: Right internal jugular vein tunneled dialysis catheter placement with its tip at the cavoatrial junction and no pneumothorax.   Electronically Signed   By: Maryclare Bean M.D.   On: 06/28/2013 12:23   US Renal  06/10/2013   CLINICAL DATA:  Renal failure  EXAM: RENAL/URINARY TRACT ULTRASOUND COMPLETE  COMPARISON:  Abdomen ultrasound 05/17/2011  FINDINGS: Right Kidney  Length: 11.9 cm length. Increased cortical echogenicity. Normal cortical thickness. No mass, hydronephrosis, or shadowing calcification.  Left Kidney  Length: 12.6 cm length. Increased cortical echogenicity.  Normal cortical thickness. Cyst at mid left kidney 3.6 x 3.6 x 3.4 cm. Vague slightly hypoechoic area at mid inferior pole left kidney, potentially artifact noted difficult to completely exclude a subtle mass, area in question 1.8 cm greatest diameter. No shadowing calcifications or hydronephrosis.  Bladder:  Unremarkable.  Right pleural effusion noted.  IMPRESSION: Medical renal disease changes in both kidneys.  3.6 cm diameter left renal cyst.  Vague hypoechoic area within left kidney, potentially artifact but difficult completely exclude mass.  Consider non contrast MR imaging (non contrast due to renal failure) to exclude solid left renal mass lesion.   Electronically Signed   By: Lavonia Dana M.D.   On: 06/10/2013 13:10   US Biopsy  06/21/2013   CLINICAL DATA:  Acute renal failure. The patient requires renal core biopsy for nephropathologic analysis.  EXAM: ULTRASOUND GUIDED CORE BIOPSY OF LEFT KIDNEY  MEDICATIONS: 4.0 mg IV Versed; 200 mcg IV Fentanyl  Total Moderate Sedation Time: 20 min.  PROCEDURE: The procedure, risks, benefits, and alternatives were explained to the patient. Questions regarding the procedure were encouraged and answered. The patient understands and consents to the procedure. Initial ultrasound was used to localize the kidneys from a posterior approach. The left kidney was chosen for biopsy.  The left flank region was prepped with Betadine in a sterile fashion, and a sterile drape was applied covering the operative field. A sterile gown and sterile gloves were used for the procedure. Local anesthesia was provided with 1% Lidocaine.  Under ultrasound guidance, a  26 gauge core biopsy needle was advanced to the lower pole cortex of the left kidney. A total of 2 separate core biopsy samples were obtained. These were submitted in saline to Pathology.  COMPLICATIONS: None.  FINDINGS: Both kidneys were visible from a posterior flank approach. The lower pole cortex of the left kidney was felt  to be slightly more visible than the right kidney. Solid and intact core biopsy samples were obtained. There was no immediate ultrasound evidence of a bleeding complication.  IMPRESSION: Ultrasound-guided core biopsy performed of the left kidney.   Electronically Signed   By: Aletta Edouard M.D.   On: 06/21/2013 13:58   Dg Fluoro Guide Cv Line-no Report  06/28/2013   CLINICAL DATA: dialysis catheter   FLOURO GUIDE CV LINE  Fluoroscopy was utilized by the requesting physician.  No radiographic  interpretation.    [place] [employer]  06/20/2013   CLINICAL DATA:  62 year old male with acute onset of nephrotic syndrome. Percutaneous renal biopsy is requested. The patient has experienced significant edema and weight gain since the most recent prior ultrasound. Limited renal ultrasound is requested to evaluate for biopsy planning.  EXAM: LIMITED RENAL/URINARY TRACT ULTRASOUND COMPLETE  COMPARISON:  Prior renal ultrasound 06/10/2013  FINDINGS: Right Kidney  Length: 12.4 cm. Echogenic renal parenchyma. No hydronephrosis. No solid renal mass.  Left Kidney  Length: 12.7 cm. Echogenic renal parenchyma. No hydronephrosis. 3.6 x 3.1 cm simple cyst exophytic from the upper pole of the kidney.  IMPRESSION: 1. The lower pole of the right kidney is approachable for percutaneous biopsy. 2. Echogenic renal parenchyma suggests underlying medical renal disease. 3. Negative for hydronephrosis.   Electronically Signed   By: Jacqulynn Cadet M.D.   On: 06/20/2013 10:31   Anti-infectives: Anti-infectives   None      Assessment/Plan: s/p Procedure(s): ARTERIOVENOUS (AV) FISTULA CREATION- Left arm with ultrasound guidance (Left) INSERTION OF DIALYSIS CATHETER (N/A) Stable postop day 1 from left wrist Cimino AV fistula placement. Also had right IJ hemodialysis catheter placement. Chest x-ray shows perfect positioning of catheter in the atriocaval junction and no leak. The patient had very poor flow through the  catheter at his first dialysis yesterday. Discuss this with the patient and wife present. Explained that for uncertain reasons catheters frequently did not function well on the first attempt. For reattempt today. If it continues to not function will attempt replacement. Following with you.   LOS: 11 days   Clinten Howk 06/29/2013, 7:53 AM

## 2013-06-29 NOTE — Progress Notes (Signed)
Brief Nutrition Note  RD consulted for new HD education. RD attempted education x 2. Pt politely requested this RD to come back and complete education tomorrow.  RD left Choose-A-Meal booklet in patient's room.  Will re-attempt education at more appropriate time.  Inda Coke MS, RD, LDN Pager: [phone] After-hours pager: [phone]

## 2013-06-29 NOTE — Progress Notes (Signed)
FMTS Attending Daily Note: Gary Skilling MD [phone] pager office [phone] I have discussed this patient with the resident and reviewed the assessment and plan as documented above. I agree wit the resident's findings and plan.  

## 2013-06-29 NOTE — Progress Notes (Signed)
Family Medicine Teaching Service Daily Progress Note Intern Pager: [phone]  Patient name: Gary Lang Medical record number: [id number] Date of birth: August 03, 1951 Age: 62 y.o. Gender: male  Primary Care Provider: Jeri Modena Consultants: Nephrology, Vascular surgery Code Status: Full  Pt Overview and Major Events to Date:  10/18: IV Solumedrol started 10/19: 24hr urine with 12-18g proteinuria 10/20: Percutaneous renal Bx 10/22: Awaiting Bx results 10/23: Bx: FSGS, started on prednisone 60mg    Assessment and Plan: Gary Lang is a 62 y.o.admitted for anasarca and AKI. PMH is significant for rheumatoid arthritis, HTN, and T2DM.   # FSGS with nephrotic proteinuria - Collapsing idiopathic FSGS on renal biopsy.  - Holding diuretics - Prednisone 60mg  po daily (10/23-) likely for 2 months. (titrating insulin, added PPI ppx) - NPO for AVF and PAC placement today.  # T2DM: A1c 9.4 on metformin alone.  - Hold metformin   - Re-starting lantus with resistant SSI - Received diabetic education, will be sent home on insulins as he will be on steroids long-term.   # AKI/nephrotic syndrome: Creatinine stable >7, with ATN on biopsy, hopefully somewhat reversible, due to massive proteinuria - Holding abatacept, metformin and lisinopril.  # Hypoalbuminemia due to nephrotic syndrome  - Worsening albumin 1.9 > 0.9 >(Received 250cc albumin 10/24)> 1.2  # Hyperlipidemia due to nephrotic syndrome - LDL 250, high-intensity statin begun [id number]  # Metabolic acidosis: due to above, improved - Bicarb was started 10/21: 19  # Hyperphosphatemia: binders started  - Phoslo 2,001 mg TID: Improving 9.7 > 8.8  #HTN. Holding lisinopril, continuing HCTZ and clonidine - BP stable.   #Rheumatoid arthritis: no recent flares and excellent control on abatacept.  - Holding abatacept 125mg  IM qMondays  #Anemia of chronic disease:  - Hgb trending up. 11.5 on 10/21 - Recheck CBC tomorrow  AM  FEN/GI: NPO until discharge.  Prophylaxis: Hep [place] TID, PPI  Disposition: Proceed with AVF placement today with HD to follow.   Subjective: No acute events overnight. Pt with unchanged anasarca and still eating/urinating.   Objective: Temp:  [97 F (36.1 C)-98.9 F (37.2 C)] 97 F (36.1 C) (10/28 1116) Pulse Rate:  [63-86] 80 (10/28 1116) Resp:  [11-18] 16 (10/28 1116) BP: (81-200)/(50-105) 128/78 mmHg (10/28 1116) SpO2:  [92 %-99 %] 98 % (10/28 1116) Weight:  [251 lb 12.3 oz (114.2 kg)-256 lb 15.5 oz (116.56 kg)] 251 lb 12.3 oz (114.2 kg) (10/28 1030)  Filed Weights   06/28/13 1630 06/29/13 0750 06/29/13 1030  Weight: 255 lb 15.3 oz (116.1 kg) 256 lb 15.5 oz (116.56 kg) 251 lb 12.3 oz (114.2 kg)   Physical Exam: General: 62 yo male sitting in bed eating breakfast Cardiovascular: RRR, soft II/VI systolic murmur Respiratory: Nonlabored, CTAB on room air Abdomen: NABS, NT, ND Extremities: diffuse 3+ pitting edema of UE and LE, nontender, 2+ pulses.   Laboratory: No results found for this basename: WBC, HGB, HCT, PLT,  in the last 168 hours  Recent Labs Lab 06/27/13 0550 06/28/13 2225 06/29/13 0430  NA 133* 130* 134*  K 3.0* 2.9* 3.1*  CL 98 95* 98  CO2 17* 19 19  BUN 117* 119* 118*  CREATININE 7.70* 7.22* 7.32*  CALCIUM 7.5* 7.6* 7.4*  GLUCOSE 262* 255* 200*     Recent Labs Lab 06/28/13 0736 06/28/13 1140 06/28/13 1723 06/28/13 2110 06/29/13 0740  GLUCAP 213* 233* 213* 296* 202*    06/18/2013 13:11  Color, Urine YELLOW  APPearance CLOUDY (A)  Specific Gravity, Urine 1.022  pH 5.5  Glucose 250 (A)  Bilirubin Urine NEGATIVE  Ketones, ur NEGATIVE  Protein >300 (A)  Urobilinogen, UA 0.2  Nitrite NEGATIVE  Leukocytes, UA NEGATIVE  Hgb urine dipstick MODERATE (A)  WBC, UA 3-6  RBC / HPF 0-2  Squamous Epithelial / LPF FEW (A)  Bacteria, UA FEW (A)  Casts GRANULAR CAST (A)   PT/INR: 13.7/1.07 ANA: Neg ASO: < 25 GBM Ab: < 1 MPO Ab: < 1 Ser  Protease-3: 1 C3: 199 C4: 44 Alpha-1-globulin: 6.8 Alpha-2-globulin: 25.8 Heb B SAg: Neg HCV Ab: Neg Hep B: negative HIV: Non-reactive U Protein: Creatinine = 12.41 24hr protein: 18.690g  UCx 10/17: 50k colonies of multiple bacterial morphotypes, none predominant   06/19/2013 05:38  Cholesterol 325 (H)  Triglycerides 145  HDL 46  LDL (calc) 250 (H)  VLDL 29  Total CHOL/HDL Ratio 7.1   Imaging/Diagnostic Tests:   ULTRASOUND-GUIDED CORE BIOPSY OF LEFT KIDNEY 10/20 FINDINGS:  Both kidneys were visible from a posterior flank approach. The lower  pole cortex of the left kidney was felt to be slightly more visible  than the right kidney. Solid and intact core biopsy samples were  obtained. There was no immediate ultrasound evidence of a bleeding  complication.  IMPRESSION:  Ultrasound-guided core biopsy performed of the left kidney.  LIMITED RENAL/URINARY TRACT ULTRASOUND COMPLETE  COMPARISON: Prior renal ultrasound 06/10/2013  FINDINGS:  Right Kidney  Length: 12.4 cm. Echogenic renal parenchyma. No hydronephrosis. No  solid renal mass.  Left Kidney  Length: 12.7 cm. Echogenic renal parenchyma. No hydronephrosis. 3.6  x 3.1 cm simple cyst exophytic from the upper pole of the kidney.  IMPRESSION:  1. The lower pole of the right kidney is approachable for  percutaneous biopsy.  2. Echogenic renal parenchyma suggests underlying medical renal  disease.  3. Negative for hydronephrosis.  06/10/2013  IMPRESSION: Medical renal disease changes in both kidneys. 3.6 cm diameter left renal cyst. Vague hypoechoic area within left kidney, potentially artifact but difficult completely exclude mass. Consider non contrast MR imaging (non contrast due to renal failure) to exclude solid left renal mass lesion.   Vance Gather, MD 06/29/2013, 12:57 PM PGY-1, [place] Intern pager: [phone], text pages welcome

## 2013-06-29 NOTE — Progress Notes (Signed)
Pt's IV is outdated and pt has refused to be stuck to have his IV restarted. Will f/u.

## 2013-06-29 NOTE — Progress Notes (Signed)
Inpatient Diabetes Program Recommendations  AACE/ADA: New Consensus Statement on Inpatient Glycemic Control (2013)  Target Ranges:  Prepandial:   less than 140 mg/dL      Peak postprandial:   less than 180 mg/dL (1-2 hours)      Critically ill patients:  140 - 180 mg/dL     Results for TAVIONE, REIGNER (MRN [id number]) as of 06/29/2013 09:35  Ref. Range 06/28/2013 07:36 06/28/2013 11:40 06/28/2013 17:23 06/28/2013 21:10  Glucose-Capillary Latest Range: 70-99 mg/dL 213 (H) 233 (H) 213 (H) 296 (H)    Results for FREDRIK, HOLDERBY (MRN [id number]) as of 06/29/2013 09:35  Ref. Range 06/29/2013 07:40  Glucose-Capillary Latest Range: 70-99 mg/dL 202 (H)    **Noted patient did not get any Lantus insulin yesterday.  **Patient only received one dose of Novolog yesterday at supper time   MD- Please consider the following in-hospital insulin adjustments:  1. Increase Lantus to 5 units daily 2. Decrease Novolog SSI to Sensitive scale tid with meals (currently on Resistant scale) 3. Add Novolog meal coverage- Novolog 4 units tid with meals   Will follow. Wyn Quaker RN, MSN, CDE Diabetes Coordinator Inpatient Diabetes Program Team Pager: [phone] (8a-10p)

## 2013-06-29 NOTE — Progress Notes (Signed)
Pt refused to be stuck to replace his current outdated IV site. States he will allow someone to try tomorrow. RN notified of pt's request. [employer]

## 2013-06-29 NOTE — Progress Notes (Signed)
FMTS Attending Daily Note: Sara Neal MD [phone] pager office [phone] I have discussed this patient with the resident and reviewed the assessment and plan as documented above. I agree wit the resident's findings and plan.  

## 2013-06-29 NOTE — Procedures (Signed)
Assessment/Plan:  1. AKI/nephrotic syndrome; possible ESRD. (Labs from 12/20/12 revealed creatinine of 1.24mg /dl) s/p PC placement and AVG/AVF by Dr. Donnetta Hutching. Dialysis today. Started on prednisone 60mg  qd.  Find out pt spot for HD               2.   HTN- stable, now on dilt to help with proteinuria and HTN  Tolerating dialysis today.  Catheter now functioning properly c/w yesterday.  We are working on an outpatient spot in [place].  He has a left wrist AVF. There is marked edema of legs. Will do another HD tx in am. Gary Lang C     .

## 2013-06-30 LAB — CBC
HCT: 29 % — ABNORMAL LOW (ref 39.0–52.0)
Hemoglobin: 10.4 g/dL — ABNORMAL LOW (ref 13.0–17.0)
MCV: 78.4 fL (ref 78.0–100.0)
RBC: 3.7 MIL/uL — ABNORMAL LOW (ref 4.22–5.81)
WBC: 9 10*3/uL (ref 4.0–10.5)

## 2013-06-30 LAB — RENAL FUNCTION PANEL
BUN: 101 mg/dL — ABNORMAL HIGH (ref 6–23)
CO2: 22 mEq/L (ref 19–32)
Chloride: 98 mEq/L (ref 96–112)
Creatinine, Ser: 7.16 mg/dL — ABNORMAL HIGH (ref 0.50–1.35)
Sodium: 135 mEq/L (ref 135–145)

## 2013-06-30 LAB — GLUCOSE, CAPILLARY
Glucose-Capillary: 284 mg/dL — ABNORMAL HIGH (ref 70–99)
Glucose-Capillary: 176 mg/dL — ABNORMAL HIGH (ref 70–99)
Glucose-Capillary: 238 mg/dL — ABNORMAL HIGH (ref 70–99)
Glucose-Capillary: 274 mg/dL — ABNORMAL HIGH (ref 70–99)

## 2013-06-30 MED ORDER — ALTEPLASE 2 MG IJ SOLR
2.0000 mg | Freq: Once | INTRAMUSCULAR | Status: DC | PRN
  Filled 2013-06-30: qty 2

## 2013-06-30 MED ORDER — SODIUM CHLORIDE 0.9 % IV SOLN: 100.0000 mL | INTRAVENOUS | Status: DC | PRN

## 2013-06-30 MED ORDER — NEPRO/CARBSTEADY PO LIQD: 237.0000 mL | ORAL | Status: DC | PRN

## 2013-06-30 MED ORDER — LIDOCAINE-PRILOCAINE 2.5-2.5 % EX CREA: 1.0000 "application " | TOPICAL_CREAM | CUTANEOUS | Status: DC | PRN

## 2013-06-30 MED ORDER — HEPARIN SODIUM (PORCINE) 1000 UNIT/ML DIALYSIS
1000.0000 [IU] | INTRAMUSCULAR | Status: DC | PRN
  Filled 2013-06-30: qty 1

## 2013-06-30 MED ORDER — HEPARIN SODIUM (PORCINE) 1000 UNIT/ML DIALYSIS
20.0000 [IU]/kg | INTRAMUSCULAR | Status: DC | PRN
  Filled 2013-06-30: qty 3

## 2013-06-30 MED ORDER — PENTAFLUOROPROP-TETRAFLUOROETH EX AERO: 1.0000 "application " | INHALATION_SPRAY | CUTANEOUS | Status: DC | PRN

## 2013-06-30 MED ORDER — ALTEPLASE 2 MG IJ SOLR: 2.0000 mg | Freq: Once | INTRAMUSCULAR | Status: AC | PRN

## 2013-06-30 MED ORDER — LIDOCAINE HCL (PF) 1 % IJ SOLN: 5.0000 mL | INTRAMUSCULAR | Status: DC | PRN

## 2013-06-30 MED ORDER — RENA-VITE PO TABS
1.0000 | ORAL_TABLET | Freq: Every day | ORAL | Status: DC
  Administered 2013-06-30: 1 via ORAL
  Filled 2013-06-30 (×2): qty 1

## 2013-06-30 MED ORDER — HEPARIN SODIUM (PORCINE) 1000 UNIT/ML DIALYSIS
20.0000 [IU]/kg | INTRAMUSCULAR | Status: DC | PRN
  Administered 2013-06-30: 2300 [IU] via INTRAVENOUS_CENTRAL
  Filled 2013-06-30: qty 3

## 2013-06-30 NOTE — Progress Notes (Signed)
FMTS Attending Daily Note: Cheney Gosch MD [phone] pager office [phone] I have discussed this patient with the resident and reviewed the assessment and plan as documented above. I agree wit the resident's findings and plan.  

## 2013-06-30 NOTE — Progress Notes (Signed)
06/30/2013 10:14 AM Hemodialysis Outpatient Note; this patient has been accepted at the [facility] on a Mon Wed Fri 2nd shift schedule. The patient can begin treatment this Friday 10.31.14. His chair time will be 12pm but please have patient arrive at 11am to sign paperwork/consents. Thank  You. Gordy Savers

## 2013-06-30 NOTE — Progress Notes (Signed)
Assessment/Plan:        1.  Nephrotic syndrome due to Collapsing FSGS;  ESRD. (Labs from 12/20/12 revealed creatinine of 1.24mg /dl) s/p    PC placement and AVG/AVF by Dr. Donnetta Hutching. Started on prednisone 60mg  qd. . Plans for HD at [facility] MWF. Would like to dialyze again in AM and would be ok for discharge then.  2. HTN- stable    Subjective: Interval History: none. Feels better  Objective: Vital signs in last 24 hours: Temp:  [97.1 F (36.2 C)-98.6 F (37 C)] 98 F (36.7 C) (10/29 1105) Pulse Rate:  [60-85] 69 (10/29 1105) Resp:  [17-18] 18 (10/29 1105) BP: (108-151)/(67-82) 115/70 mmHg (10/29 1105) SpO2:  [94 %-99 %] 96 % (10/29 1105) Weight:  [114.034 kg (251 lb 6.4 oz)-115.3 kg (254 lb 3.1 oz)] 115.3 kg (254 lb 3.1 oz) (10/29 0655) Weight change: 0.36 kg (12.7 oz)  Intake/Output from previous day: 10/28 0701 - 10/29 0700 In: 540 [P.O.:540] Out: 3625 [Urine:1225] Intake/Output this shift: Total I/O In: -  Out: 4000 [Other:4000]  General appearance: alert and cooperative Resp: clear to auscultation bilaterally Chest wall: no tenderness Cardio: regular rate and rhythm, S1, S2 normal, no murmur, click, rub or gallop Extremities: edema 1-2+mptroved  Lab Results:  Recent Labs  06/30/13 0719  WBC 9.0  HGB 10.4*  HCT 29.0*  PLT 240   BMET:  Recent Labs  06/29/13 0430 06/30/13 0719  NA 134* 135  K 3.1* 3.1*  CL 98 98  CO2 19 22  GLUCOSE 200* 260*  BUN 118* 101*  CREATININE 7.32* 7.16*  CALCIUM 7.4* 7.5*   No results found for this basename: PTH,  in the last 72 hours Iron Studies: No results found for this basename: IRON, TIBC, TRANSFERRIN, FERRITIN,  in the last 72 hours Studies/Results: Dg Chest 1 View  06/28/2013   CLINICAL DATA:  Postop  EXAM: CHEST - 1 VIEW  COMPARISON:  None.  FINDINGS: A tunneled right internal jugular vein dialysis catheter has been placed. Tip is at the cavoatrial junction and right atrium. Lungs are under aerated. No pneumothorax.  Opacity at the left base likely represents cardiomegaly and volume loss.  IMPRESSION: Right internal jugular vein tunneled dialysis catheter placement with its tip at the cavoatrial junction and no pneumothorax.   Electronically Signed   By: Maryclare Bean M.D.   On: 06/28/2013 12:23   Scheduled: . atorvastatin  80 mg Oral q1800  . calcium acetate  2,001 mg Oral TID WC  . cloNIDine  0.2 mg Oral BID  . diltiazem  180 mg Oral Daily  . docusate sodium  100 mg Oral BID  . heparin subcutaneous  5,000 Units Subcutaneous Q8H  . insulin aspart  0-20 Units Subcutaneous TID WC  . insulin glargine  3 Units Subcutaneous Daily  . pantoprazole  20 mg Oral Daily  . predniSONE  60 mg Oral Q breakfast  . sodium chloride  3 mL Intravenous Q12H    LOS: 12 days   Lutricia Widjaja C 06/30/2013,12:00 PM

## 2013-06-30 NOTE — Progress Notes (Signed)
Family Medicine Teaching Service Daily Progress Note Intern Pager: [phone]  Patient name: Gary Lang Medical record number: [id number] Date of birth: 07/27/1951 Age: 62 y.o. Gender: male  Primary Care Provider: Jeri Modena Consultants: Nephrology, Vascular surgery Code Status: Full  Pt Overview and Major Events to Date:  10/18: IV Solumedrol started 10/19: 24hr urine with 12-18g proteinuria 10/20: Percutaneous renal Bx 10/22: Awaiting Bx results 10/23: Bx: FSGS, started on prednisone 60mg   10/27: PC and AVG/AVF placed 10/28: HD # 1 10/29: HD # 2, pt clipped with outpatient nephrology for HD  Assessment and Plan: Gary Lang is a 62 y.o.admitted for anasarca and AKI. PMH is significant for rheumatoid arthritis, HTN, and T2DM.   # FSGS with nephrotic proteinuria - Collapsing idiopathic FSGS on renal biopsy.  - Holding diuretics - Prednisone 60mg  po daily (10/23-) likely for 2 months. (titrating insulin, added PPI ppx) - AVF and catheter placed 10/27, received HD 10/28 and receiving 2nd HD today - Has placement at [facility] for MWF HD to begin 07/02/13 - RD to re-attempt new ESRD education - Nephrology recs: 1 more HD tomorrow prior to d/c  # T2DM: A1c 9.4 on metformin alone.  - Hold metformin   - Re-starting lantus with resistant SSI - Received diabetic education, will be sent home on insulins as he will be on steroids long-term.   # AKI/nephrotic syndrome: Creatinine stable >7, with ATN on biopsy, likely ESRD with FSGS and nephrotic-range proteinuria - Holding abatacept, metformin and lisinopril.  # Hypoalbuminemia due to nephrotic syndrome  - Worsening albumin 1.9 > 0.9 >(Received 250cc albumin 10/24)> 1.2  # Hyperlipidemia due to nephrotic syndrome - LDL 250, high-intensity statin begun [id number]  # Metabolic acidosis: due to above, improved, bicarb 22 today - Bicarb was started 10/21: 19 - Stopped by renal  # Hyperphosphatemia: binders  started  - Phoslo 2,001 mg TID: Improving 9.7 > 8.8  #HTN. Holding lisinopril and HCTZ, continued clonidine - Nephrology started diltiazem to also help with proteinuria - BP borderline-low this morning at 108/76 - likely from HD, improved to 0000000 systolic after session ended. - Monitor   #Rheumatoid arthritis: no recent flares and excellent control on abatacept.  - Holding abatacept 125mg  IM qMondays  #Anemia of chronic disease:  - Hgb trending up. 11.5 on 10/21, down after bx to 10.4 (baseline appears to be 10.5-11)  FEN/GI: Renal diet Prophylaxis: Hep [place] TID, PPI  Disposition: Pending nephrology recs, likely tomorrow after another HD session.  - Pt would like to follow up with [place] - have gotten pt an appt.  Subjective: Pt seen in HD this morning, feels somewhat nauseated but no other complaints.  Objective: Temp:  [97 F (36.1 C)-98.6 F (37 C)] 98.1 F (36.7 C) (10/29 0655) Pulse Rate:  [60-83] 83 (10/29 1000) Resp:  [16-18] 18 (10/29 0655) BP: (108-151)/(67-82) 108/76 mmHg (10/29 1000) SpO2:  [94 %-99 %] 95 % (10/29 0655) Weight:  [251 lb 6.4 oz (114.034 kg)-254 lb 3.1 oz (115.3 kg)] 254 lb 3.1 oz (115.3 kg) (10/29 0655)  Filed Weights   06/29/13 1030 06/29/13 2133 06/30/13 0655  Weight: 251 lb 12.3 oz (114.2 kg) 251 lb 6.4 oz (114.034 kg) 254 lb 3.1 oz (115.3 kg)   Physical Exam General: 62 yo male sitting in bed in HD, tired-appearing Cardiovascular: RRR, soft II/VI systolic murmur Respiratory: Nonlabored, CTAB on room air Abdomen: NABS, NT, ND Extremities: diffuse 3+ pitting edema of UE and LE, nontender, 2+ pulses, no erythema  Laboratory:  Recent Labs Lab 06/30/13 0719  WBC 9.0  HGB 10.4*  HCT 29.0*  PLT 240    Recent Labs Lab 06/28/13 2225 06/29/13 0430 06/30/13 0719  NA 130* 134* 135  K 2.9* 3.1* 3.1*  CL 95* 98 98  CO2 19 19 22   BUN 119* 118* 101*  CREATININE 7.22* 7.32* 7.16*  CALCIUM 7.6* 7.4* 7.5*  GLUCOSE 255* 200* 260*      Recent Labs Lab 06/28/13 2110 06/29/13 0740 06/29/13 1103 06/29/13 1653 06/29/13 2129  GLUCAP 296* 202* 182* 227* 284*    06/18/2013 13:11  Color, Urine YELLOW  APPearance CLOUDY (A)  Specific Gravity, Urine 1.022  pH 5.5  Glucose 250 (A)  Bilirubin Urine NEGATIVE  Ketones, ur NEGATIVE  Protein >300 (A)  Urobilinogen, UA 0.2  Nitrite NEGATIVE  Leukocytes, UA NEGATIVE  Hgb urine dipstick MODERATE (A)  WBC, UA 3-6  RBC / HPF 0-2  Squamous Epithelial / LPF FEW (A)  Bacteria, UA FEW (A)  Casts GRANULAR CAST (A)   PT/INR: 13.7/1.07 ANA: Neg ASO: < 25 GBM Ab: < 1 MPO Ab: < 1 Ser Protease-3: 1 C3: 199 C4: 44 Alpha-1-globulin: 6.8 Alpha-2-globulin: 25.8 Heb B SAg: Neg HCV Ab: Neg Hep B: negative HIV: Non-reactive U Protein: Creatinine = 12.41 24hr protein: 18.690g  UCx 10/17: 50k colonies of multiple bacterial morphotypes, none predominant   06/19/2013 05:38  Cholesterol 325 (H)  Triglycerides 145  HDL 46  LDL (calc) 250 (H)  VLDL 29  Total CHOL/HDL Ratio 7.1   Imaging/Diagnostic Tests:   ULTRASOUND-GUIDED CORE BIOPSY OF LEFT KIDNEY 10/20 FINDINGS:  Both kidneys were visible from a posterior flank approach. The lower  pole cortex of the left kidney was felt to be slightly more visible  than the right kidney. Solid and intact core biopsy samples were  obtained. There was no immediate ultrasound evidence of a bleeding  complication.  IMPRESSION:  Ultrasound-guided core biopsy performed of the left kidney.  LIMITED RENAL/URINARY TRACT ULTRASOUND COMPLETE  COMPARISON: Prior renal ultrasound 06/10/2013  FINDINGS:  Right Kidney  Length: 12.4 cm. Echogenic renal parenchyma. No hydronephrosis. No  solid renal mass.  Left Kidney  Length: 12.7 cm. Echogenic renal parenchyma. No hydronephrosis. 3.6  x 3.1 cm simple cyst exophytic from the upper pole of the kidney.  IMPRESSION:  1. The lower pole of the right kidney is approachable for  percutaneous  biopsy.  2. Echogenic renal parenchyma suggests underlying medical renal  disease.  3. Negative for hydronephrosis.  06/10/2013  IMPRESSION: Medical renal disease changes in both kidneys. 3.6 cm diameter left renal cyst. Vague hypoechoic area within left kidney, potentially artifact but difficult completely exclude mass. Consider non contrast MR imaging (non contrast due to renal failure) to exclude solid left renal mass lesion.   Hilton Sinclair, MD 06/30/2013, 10:31 AM PGY-2, [place] Intern pager: [phone], text pages welcome

## 2013-06-30 NOTE — Progress Notes (Addendum)
Inpatient Diabetes Program Recommendations  AACE/ADA: New Consensus Statement on Inpatient Glycemic Control (2013)  Target Ranges:  Prepandial:   less than 140 mg/dL      Peak postprandial:   less than 180 mg/dL (1-2 hours)      Critically ill patients:  140 - 180 mg/dL     **Educated patient on insulin pen use at home.  Reviewed contents of insulin flexpen starter kit.  Reviewed all steps of insulin pen including attachment of needle, 2-unit air shot, dialing up dose, giving injection, removing needle, disposal of sharps, storage of unused insulin, disposal of insulin etc.  Patient able to provide successful return demonstration.  Also reviewed troubleshooting with insulin pen.  MD to give patient Rxs for insulin pens and insulin pen needles.  **Patient stated he has watched his son give insulin with insulin pens before.  According to patient, his son has had Type 1 DM since he was 63 years old.  Patient stated he did not have any additional questions for me at this time.   **MD- Please make sure to give patient the following Rxs at d/c:  1. CBG meter and strips 2. Lantus solostar insulin pen 3. Novolog Flexpen 4. Insulin pen needles "31 gauge X 69mm"  Will follow. Wyn Quaker RN, MSN, CDE Diabetes Coordinator Inpatient Diabetes Program Team Pager: [phone] (8a-10p)

## 2013-06-30 NOTE — Plan of Care (Signed)
Problem: Food- and Nutrition-Related Knowledge Deficit (NB-1.1) Goal: Nutrition education Formal process to instruct or train a patient/client in a skill or to impart knowledge to help patients/clients voluntarily manage or modify food choices and eating behavior to maintain or improve health. Outcome: Completed/Met Date Met:  06/30/13 Nutrition Education Note  RD consulted for Renal Education. Provided Choose-A-Meal Booklet to patient. Reviewed food groups and provided written recommended serving sizes specifically determined for patient's current nutritional status.   Explained why diet restrictions are needed and provided lists of foods to limit/avoid that are high potassium, sodium, and phosphorus. Provided specific recommendations on safer alternatives of these foods. Strongly encouraged compliance of this diet.   Discussed importance of protein intake at each meal and snack. Provided examples of how to maximize protein intake throughout the day. Discussed need for fluid restriction with dialysis, importance of minimizing weight gain between HD treatments, and renal-friendly beverage options.  Encouraged pt to discuss specific diet questions/concerns with RD at HD outpatient facility. Teach back method used.  Expect good compliance.  Body mass index is 33.08 kg/(m^2). Pt meets criteria for Obese Class I based on current BMI.  Current diet order is Renal, patient is consuming approximately 50-75% of meals at this time. Labs and medications reviewed. No further nutrition interventions warranted at this time. RD contact information provided. If additional nutrition issues arise, please re-consult RD.  Inda Coke MS, RD, LDN Pager: [phone] After-hours pager: [phone]

## 2013-07-01 LAB — GLUCOSE, CAPILLARY: Glucose-Capillary: 200 mg/dL — ABNORMAL HIGH (ref 70–99)

## 2013-07-01 MED ORDER — PREDNISONE 20 MG PO TABS: 60.0000 mg | ORAL_TABLET | Freq: Every day | ORAL | Status: DC

## 2013-07-01 MED ORDER — INSULIN ASPART 100 UNIT/ML FLEXPEN: 5.0000 [IU] | PEN_INJECTOR | Freq: Three times a day (TID) | SUBCUTANEOUS | Status: DC

## 2013-07-01 MED ORDER — PEN NEEDLES 31G X 6 MM MISC: Status: DC

## 2013-07-01 MED ORDER — ATORVASTATIN CALCIUM 80 MG PO TABS: 80.0000 mg | ORAL_TABLET | Freq: Every day | ORAL | Status: DC

## 2013-07-01 MED ORDER — GLUCOSE BLOOD VI STRP: ORAL_STRIP | Status: DC

## 2013-07-01 MED ORDER — INSULIN GLARGINE 100 UNIT/ML SOLOSTAR PEN: 20.0000 [IU] | PEN_INJECTOR | Freq: Every morning | SUBCUTANEOUS | Status: DC

## 2013-07-01 NOTE — Progress Notes (Signed)
Assessment/Plan:  1. Nephrotic syndrome due to Collapsing FSGS; ESRD. (Labs from 12/20/12 revealed creatinine of 1.24mg /dl)  s/p PC placement and AVG/AVF by Dr. Donnetta Hutching. Started on prednisone 60mg  qd. . Plans for HD at [facility] MWF.  ok for discharge then.  2. HTN- stable  Pt tolerating treatment without hemodynamic instability. Will follow up with steroid tx and taper as outpt. Myrical Andujo C

## 2013-07-01 NOTE — Progress Notes (Signed)
Patient discharged to home. Patient AVS reviewed with patient and patient's wife. Patient verbalized understanding of medications and follow-up appointments.  Patient remains stable; no signs or symptoms of distress.  Patient educated to return to the ER in cases of SOB, dizziness, fever, chest pain, fainting, or signs of infection.

## 2013-07-02 NOTE — Discharge Summary (Signed)
Family Medicine Teaching Service  Discharge Note : Attending Nikia Mangino MD Pager [phone] Office [phone] I have seen and examined this patient, reviewed their chart and discussed discharge planning wit the resident at the time of discharge. I agree with the discharge plan as above.  

## 2013-07-05 ENCOUNTER — Ambulatory Visit: Payer: 59 | Admitting: Family Medicine

## 2013-07-06 ENCOUNTER — Encounter: Payer: Self-pay | Admitting: Family Medicine

## 2013-07-06 ENCOUNTER — Ambulatory Visit (INDEPENDENT_AMBULATORY_CARE_PROVIDER_SITE_OTHER): Payer: 59 | Admitting: Family Medicine

## 2013-07-06 VITALS — BP 117/65 | HR 66 | Temp 98.1°F | Wt 235.9 lb

## 2013-07-06 DIAGNOSIS — N032 Chronic nephritic syndrome with diffuse membranous glomerulonephritis: Secondary | ICD-10-CM

## 2013-07-06 DIAGNOSIS — E119 Type 2 diabetes mellitus without complications: Secondary | ICD-10-CM

## 2013-07-06 DIAGNOSIS — M069 Rheumatoid arthritis, unspecified: Secondary | ICD-10-CM

## 2013-07-06 DIAGNOSIS — N051 Unspecified nephritic syndrome with focal and segmental glomerular lesions: Secondary | ICD-10-CM | POA: Insufficient documentation

## 2013-07-06 DIAGNOSIS — I10 Essential (primary) hypertension: Secondary | ICD-10-CM

## 2013-07-06 MED ORDER — INSULIN GLARGINE 100 UNIT/ML SOLOSTAR PEN: 20.0000 [IU] | PEN_INJECTOR | Freq: Every morning | SUBCUTANEOUS | Status: DC

## 2013-07-06 NOTE — Assessment & Plan Note (Addendum)
New Dx from admission Pt w/ temp HD cath and on dialysis MWF AV fistula in place but needs to mature Making urine Cont prednisone F/u w/ Renal as previously decided

## 2013-07-06 NOTE — Progress Notes (Signed)
Riggins Hanson is a 62 y.o. male who presents to [facility] today for hospital follow up   DM: CBG around 300-400. Lantus 20 every morning and 5 novolog w/ meals. Denies any symptoms of hypo/hyp[erglycemia  RA: not taking abatacept but on prednisone.   HTN: no longer taking hctz or lisinopril. Only on clonidine adn diltiazem . Denies CP, HA, SOB, palpitations  Renal failure: dialylsis m/w/f. Still on prednisone. Fistula w/o pain or discharge. HD port OK per pt. Feels tired after dialysis, but much better overall  The following portions of the patient's history were reviewed and updated as appropriate: allergies, current medications, past medical history, family and social history, and problem list.  Patient is a nonsmoker.  Past Medical History  Diagnosis Date  . Hypertension   . Diabetes mellitus without complication   . Rheumatoid arthritis     ROS as above otherwise neg.    Medications reviewed. Current Outpatient Prescriptions  Medication Sig Dispense Refill  . aspirin 81 MG EC tablet Take 1 tablet (81 mg total) by mouth daily. Swallow whole.      Marland Kitchen atorvastatin (LIPITOR) 80 MG tablet Take 1 tablet (80 mg total) by mouth daily.  30 tablet  0  . cloNIDine (CATAPRES) 0.2 MG tablet Take 0.2 mg by mouth 2 (two) times daily.      Marland Kitchen diltiazem (DILACOR XR) 180 MG 24 hr capsule Take 180 mg by mouth daily.      . predniSONE (DELTASONE) 20 MG tablet Take 3 tablets (60 mg total) by mouth daily with breakfast.  30 tablet  1  . Abatacept 125 MG/ML SOLN Inject 1 mL into the skin once a week. Every monday      . acetaminophen (TYLENOL) 500 MG tablet Take 2 tablets (1,000 mg total) by mouth 3 (three) times daily as needed for pain.      Marland Kitchen glucose blood (TRUETEST TEST) test strip Use as instructed  100 each  12  . insulin aspart (NOVOLOG FLEXPEN) 100 UNIT/ML SOPN FlexPen Inject 5 Units into the skin 3 (three) times daily with meals.  5 pen  0  . Insulin Glargine 100 UNIT/ML SOPN Inject 20 Units into the  skin every morning.  5 pen  0  . Insulin Pen Needle (PEN NEEDLES) 31G X 6 MM MISC Use 1 needle per injection as directed  100 each  1  . Multiple Vitamins-Minerals (MULTIVITAMIN PO) Take 1 tablet by mouth daily.       No current facility-administered medications for this visit.    Exam:  BP 117/65  Pulse 66  Temp(Src) 98.1 F (36.7 C) (Oral)  Wt 235 lb 14.4 oz (107.004 kg) Gen: Well NAD HEENT: EOMI,  MMM Lungs: CTABL Nl WOB Heart: RRR no MRG Abd: NABS, NT, ND Exts: 1+ LE edema bilat  No results found for this or any previous visit (from the past 72 hour(s)).

## 2013-07-06 NOTE — Assessment & Plan Note (Signed)
Well controlled Cont dilt and clonidine

## 2013-07-06 NOTE — Assessment & Plan Note (Signed)
No active flare Cont to hold abatacept On prednisone

## 2013-07-06 NOTE — Patient Instructions (Signed)
You are doing great. Please call the kidney doctor to follow up in their office Please titrate your lantus as follows  If your morning sugar is greater than 200 please increase your lantus by 5 units ( for example take 25 units instead of 20 units) If you morniong sugar is less than 100 please decrease your lantus by 5 units (for example take 15 units instead of 20 units) Please also take an an additional 2-5 units of novolog for particularly large (carbohydrate heavy ) meals.   Please come back in 1 month to meet with Dr. Bonner Puna Or myself  Have a wonderful day

## 2013-07-06 NOTE — Assessment & Plan Note (Signed)
Lantus 20 Qam w/ titration given ( >200 then inc by 5 and < 100 then dec by 5) Novolog cont 5 units QAC TID may inc if particularly large carb meal Pt warned about overdose given Renal impairment

## 2013-07-09 ENCOUNTER — Encounter (HOSPITAL_COMMUNITY): Payer: Self-pay

## 2013-07-22 ENCOUNTER — Other Ambulatory Visit: Payer: Self-pay | Admitting: Family Medicine

## 2013-07-22 NOTE — Telephone Encounter (Signed)
Wife was told to call by the outpatient pharmacy to make sure the prednisone gets refilled quickly

## 2013-07-23 NOTE — Telephone Encounter (Signed)
Wife called today to make sure we have a print copy of her husbands prednisone left up front today for pickup. He is out and has not had his medication for 4 days now. JW

## 2013-07-28 ENCOUNTER — Other Ambulatory Visit: Payer: Self-pay | Admitting: Vascular Surgery

## 2013-07-28 DIAGNOSIS — Z4931 Encounter for adequacy testing for hemodialysis: Secondary | ICD-10-CM

## 2013-07-28 DIAGNOSIS — N186 End stage renal disease: Secondary | ICD-10-CM

## 2013-07-30 ENCOUNTER — Other Ambulatory Visit: Payer: Self-pay | Admitting: Family Medicine

## 2013-08-02 ENCOUNTER — Telehealth: Payer: Self-pay | Admitting: Family Medicine

## 2013-08-02 MED ORDER — PANTOPRAZOLE SODIUM 20 MG PO TBEC: 20.0000 mg | DELAYED_RELEASE_TABLET | Freq: Every day | ORAL | Status: DC

## 2013-08-02 MED ORDER — DILTIAZEM HCL ER 180 MG PO CP24: 180.0000 mg | ORAL_CAPSULE | Freq: Every day | ORAL | Status: DC

## 2013-08-02 NOTE — Telephone Encounter (Signed)
Needs refill on diltiazem.requested pantoprazole to be refilled on Friday Pharmacy: [facility]

## 2013-08-02 NOTE — Telephone Encounter (Signed)
done

## 2013-08-05 ENCOUNTER — Encounter: Payer: Self-pay | Admitting: Family Medicine

## 2013-08-05 ENCOUNTER — Ambulatory Visit (INDEPENDENT_AMBULATORY_CARE_PROVIDER_SITE_OTHER): Payer: 59 | Admitting: Family Medicine

## 2013-08-05 VITALS — BP 124/71 | HR 81 | Wt 234.0 lb

## 2013-08-05 DIAGNOSIS — E119 Type 2 diabetes mellitus without complications: Secondary | ICD-10-CM

## 2013-08-05 DIAGNOSIS — I10 Essential (primary) hypertension: Secondary | ICD-10-CM

## 2013-08-05 DIAGNOSIS — N051 Unspecified nephritic syndrome with focal and segmental glomerular lesions: Secondary | ICD-10-CM

## 2013-08-05 DIAGNOSIS — N032 Chronic nephritic syndrome with diffuse membranous glomerulonephritis: Secondary | ICD-10-CM

## 2013-08-05 DIAGNOSIS — M069 Rheumatoid arthritis, unspecified: Secondary | ICD-10-CM

## 2013-08-05 MED ORDER — ATORVASTATIN CALCIUM 80 MG PO TABS: 80.0000 mg | ORAL_TABLET | Freq: Every day | ORAL | Status: DC

## 2013-08-05 MED ORDER — INSULIN GLARGINE 100 UNIT/ML SOLOSTAR PEN: 20.0000 [IU] | PEN_INJECTOR | Freq: Every morning | SUBCUTANEOUS | Status: DC

## 2013-08-05 MED ORDER — CLONIDINE HCL 0.2 MG PO TABS: 0.2000 mg | ORAL_TABLET | Freq: Two times a day (BID) | ORAL | Status: DC

## 2013-08-05 MED ORDER — CALCIUM ACETATE 667 MG PO CAPS: ORAL_CAPSULE | ORAL | Status: DC

## 2013-08-05 MED ORDER — DILTIAZEM HCL ER 180 MG PO CP24: 180.0000 mg | ORAL_CAPSULE | Freq: Every day | ORAL | Status: DC

## 2013-08-05 NOTE — Progress Notes (Signed)
Gary Lang is a 62 y.o. male who presents to [facility] today for routine f/u.  Renal: prednisone and dialysis ongoing. Dialysis MWF. Dr. Florene Glen is managing. Still making urine QOD  DM: CBG range from 200-500s on prednisone and down to 146-260 off prednisone. 25 lantus in am adn 5 of novolog QAC. Going to DM class  HTN: Denies CP, Palpitations, HA, SYncope  The following portions of the patient's history were reviewed and updated as appropriate: allergies, current medications, past medical history, family and social history, and problem list.  Patient is a nonsmoker.   Past Medical History  Diagnosis Date  . Hypertension   . Diabetes mellitus without complication   . Rheumatoid arthritis     ROS as above otherwise neg.    Medications reviewed. Current Outpatient Prescriptions  Medication Sig Dispense Refill  . acetaminophen (TYLENOL) 500 MG tablet Take 2 tablets (1,000 mg total) by mouth 3 (three) times daily as needed for pain.      Marland Kitchen aspirin 81 MG EC tablet Take 1 tablet (81 mg total) by mouth daily. Swallow whole.      Marland Kitchen atorvastatin (LIPITOR) 80 MG tablet Take 1 tablet (80 mg total) by mouth daily.  30 tablet  0  . calcium acetate (PHOSLO) 667 MG capsule TAKE 1 CAPSULE BY MOUTH THREE TIMES DAILY WITH MEALS  90 capsule  0  . cloNIDine (CATAPRES) 0.2 MG tablet Take 0.2 mg by mouth 2 (two) times daily.      Marland Kitchen diltiazem (DILACOR XR) 180 MG 24 hr capsule Take 1 capsule (180 mg total) by mouth daily.  90 capsule  3  . glucose blood (TRUETEST TEST) test strip Use as instructed  100 each  12  . insulin aspart (NOVOLOG FLEXPEN) 100 UNIT/ML SOPN FlexPen Inject 5 Units into the skin 3 (three) times daily with meals.  5 pen  0  . Insulin Glargine 100 UNIT/ML SOPN Inject 20 Units into the skin every morning. Increase by 5 units if morning CBG is greater than 200 and decrease by 5 units if morning CBG is less than 100  5 pen  0  . Insulin Pen Needle (PEN NEEDLES) 31G X 6 MM MISC Use 1 needle per  injection as directed  100 each  1  . Multiple Vitamins-Minerals (MULTIVITAMIN PO) Take 1 tablet by mouth daily.      . pantoprazole (PROTONIX) 20 MG tablet TAKE 1 TABLET BY MOUTH DAILY  30 tablet  0  . predniSONE (DELTASONE) 20 MG tablet TAKE 3 TABLETS BY MOUTH DAILY WITH BREAKFAST  30 tablet  1   No current facility-administered medications for this visit.    Exam:  BP 124/71  Pulse 81  Wt 234 lb (106.142 kg) Gen: Well NAD HEENT: EOMI,  MMM Lungs: CTABL Nl WOB Heart: RRR no II/VI systolic murmur Abd: NABS, NT, ND Exts:  edematous BL  LE, warm and well perfused.   No results found for this or any previous visit (from the past 72 hour(s)).  Assessment and Plan:  See problem list or below    Hypertension Well controlled. No change  Diabetes mellitus Glucose levels still too high Needs tighter control though this will be difficult given his chronic prednisone adn ESRD Will continue utilizing both long acting and short acting insulin for now Titration table revised today and again reviewed w/ pt Start 25 units lantus in am and inc by 5 if greater than 200 and decrease by 5 if <150 A1c and recheck  after Jan 18th.  Rheumatoid arthritis Well controlled. Cont prednisone for renal condition  Glomerulonephritis, focal sclerosing Graft patent but not yet mature Appt w/ Dr. Donnetta Hutching next week Cont Dialysis MWF Renal managing

## 2013-08-05 NOTE — Assessment & Plan Note (Signed)
Well controlled. Cont prednisone for renal condition

## 2013-08-05 NOTE — Assessment & Plan Note (Signed)
Graft patent but not yet mature Appt w/ Dr. Donnetta Hutching next week Cont Dialysis MWF Renal managing

## 2013-08-05 NOTE — Assessment & Plan Note (Signed)
Glucose levels still too high Needs tighter control though this will be difficult given his chronic prednisone adn ESRD Will continue utilizing both long acting and short acting insulin for now Titration table revised today and again reviewed w/ pt Start 25 units lantus in am and inc by 5 if greater than 200 and decrease by 5 if <150 A1c and recheck after Jan 18th.

## 2013-08-05 NOTE — Assessment & Plan Note (Signed)
Well controlled. No change. 

## 2013-08-05 NOTE — Patient Instructions (Signed)
Thank you for coming in today Please come back to see me after January 18th.  Please start wearing the compression stockings Please remember to change your daily lantus dose as written on the instructions Please come see me sooner if needed. Have a Merry Christmas and New Year.

## 2013-08-07 ENCOUNTER — Encounter: Payer: 59 | Attending: "Endocrinology

## 2013-08-07 VITALS — Ht 73.0 in | Wt 237.1 lb

## 2013-08-07 DIAGNOSIS — E119 Type 2 diabetes mellitus without complications: Secondary | ICD-10-CM | POA: Insufficient documentation

## 2013-08-07 DIAGNOSIS — Z713 Dietary counseling and surveillance: Secondary | ICD-10-CM | POA: Insufficient documentation

## 2013-08-07 NOTE — Progress Notes (Signed)
Patient was seen on 08/07/13 for the complete diabetes self-management series at [facility].  Current A1c = 9.4 on 06/19/13  Handouts given during class include:  Living Well with Diabetes book  Carb Counting and Meal Planning book  Meal Plan Card  Carbohydrate guide  Meal planning worksheet  Low Sodium Flavoring Tips  The diabetes portion plate  Low Carbohydrate Snack Suggestions  A1c to eAG Conversion Chart  Diabetes Medications  Stress Management  Diabetes Recommended Care Schedule  Diabetes Success Plan  Core Class Satisfaction Survey  The following learning objectives were met by the patient during this course:  Describe diabetes  State some common risk factors for diabetes  Defines the role of glucose and insulin  Identifies type of diabetes and pathophysiology  Describe the relationship between diabetes and cardiovascular risk  State the members of the Healthcare Team  States the rationale for glucose monitoring  State when to test glucose  State their individual Target Range  State the importance of logging glucose readings  Describe how to interpret glucose readings  Identifies A1C target  Explain the correlation between A1c and eAG values  State symptoms and treatment of high blood glucose  State symptoms and treatment of low blood glucose  Explain proper technique for glucose testing  Identifies proper sharps disposal  Describe the role of different macronutrients on glucose  Explain how carbohydrates affect blood glucose  State what foods contain the most carbohydrates  Demonstrate carbohydrate counting  Demonstrate how to read Nutrition Facts food label  Describe effects of various fats on heart health  Describe the importance of good nutrition for health and healthy eating strategies  Describe techniques for managing your shopping, cooking and meal planning  List strategies to follow  meal plan when dining out  Describe the effects of alcohol on glucose and how to use it safely   State the amount of activity recommended for healthy living   Describe activities suitable for individual needs   Identify ways to regularly incorporate activity into daily life   Identify barriers to activity and ways to over come these barriers  Identify diabetes medications being personally used and their primary action for lowering glucose and possible side effects   Describe role of stress on blood glucose and develop strategies to address psychosocial issues   Identify diabetes complications and ways to prevent them  Explain how to manage diabetes during illness   Evaluate success in meeting personal goal   Establish 2-3 goals that they will plan to diligently work on until they return for the  91-month follow-up visit  Goals:  Follow Diabetes Meal Plan as instructed  Eat 3 meals and 2 snacks, every 3-5 hrs  Limit carbohydrate intake to 45 grams carbohydrate/meal Limit carbohydrate intake to 15 grams carbohydrate/snack Add lean protein foods to meals/snacks  Monitor glucose levels as instructed by your doctor  Aim for 15-30 mins of physical activity daily as tolerated  Bring food record and glucose log to your next nutrition visit  Your patient has established the following 4 month goals in their individualized success plan:  Take my diabetes medications as scheduled  Your patient has identified these potential barriers to change:  None noted  Your patient has identified their diabetes self-care support plan as  [facility] Support group

## 2013-08-09 ENCOUNTER — Encounter: Payer: Self-pay | Admitting: Vascular Surgery

## 2013-08-10 ENCOUNTER — Encounter: Payer: 59 | Admitting: Vascular Surgery

## 2013-08-10 ENCOUNTER — Other Ambulatory Visit (HOSPITAL_COMMUNITY): Payer: 59

## 2013-08-16 ENCOUNTER — Encounter: Payer: Self-pay | Admitting: Vascular Surgery

## 2013-08-17 ENCOUNTER — Ambulatory Visit (HOSPITAL_COMMUNITY)
Admission: RE | Admit: 2013-08-17 | Discharge: 2013-08-17 | Disposition: A | Discharge status: Home / Self Care | Payer: 59 | Source: Ambulatory Visit | Attending: Vascular Surgery | Admitting: Vascular Surgery

## 2013-08-17 ENCOUNTER — Encounter: Payer: Self-pay | Admitting: Vascular Surgery

## 2013-08-17 ENCOUNTER — Other Ambulatory Visit: Payer: Self-pay | Admitting: Family Medicine

## 2013-08-17 ENCOUNTER — Ambulatory Visit (INDEPENDENT_AMBULATORY_CARE_PROVIDER_SITE_OTHER): Payer: Self-pay | Admitting: Vascular Surgery

## 2013-08-17 VITALS — BP 126/76 | HR 84 | Resp 18 | Ht 73.5 in | Wt 230.8 lb

## 2013-08-17 DIAGNOSIS — I770 Arteriovenous fistula, acquired: Secondary | ICD-10-CM | POA: Insufficient documentation

## 2013-08-17 DIAGNOSIS — Z4931 Encounter for adequacy testing for hemodialysis: Secondary | ICD-10-CM | POA: Insufficient documentation

## 2013-08-17 DIAGNOSIS — N186 End stage renal disease: Secondary | ICD-10-CM | POA: Insufficient documentation

## 2013-08-17 NOTE — Progress Notes (Signed)
Patient reports today for followup of right IJ catheter and left wrist Cimino AV fistula on 06/28/2013. He reports that he is in very good use of this catheter with no difficulty with flow or evidence of infection  On physical exam his left wrist incision is completely healed. He does have good flow through his forearm Cimino cephalic vein fistula. The vein is close to the surface and very straight. He does have good Dunia Pringle maturation.  He underwent a duplex in this does show a good flow with no evidence of stenosis or other difficulty with his fistula. The diameter is approximately 5 mm throughout its course.  Impression and plan stable healing after Cimino fistula creation on 1027. He will continue to work with his exercise ball. I feel that he has an excellent chance that this will be an adequate fistula for him and would reported a total of 3 months to the end of January before access. He will follow up with [place] if he has difficulty with the fistula or will followup for catheter removal

## 2013-08-19 ENCOUNTER — Other Ambulatory Visit: Payer: Self-pay | Admitting: Family Medicine

## 2013-08-19 ENCOUNTER — Ambulatory Visit: Payer: 59 | Admitting: Family Medicine

## 2013-08-19 NOTE — Telephone Encounter (Signed)
Patient been without meds for over a week now.

## 2013-08-19 NOTE — Progress Notes (Signed)
Pts wife called requesting prednisone. Unsure of dose that Nephrology would like pt to take adn would like input from Dr. Orvil Feil contacted [place] office and spoke to [place] who stated she would call the pt to take care of the prednisone Rx.  I'm OK writing prescription but need ton know the dose that renal would like pt to take as pt w/ nephrotic syndrome in renal failure.  Pts wife contacted and informed that renal would be calling her  Linna Darner, MD Family Medicine PGY-3 08/19/2013, 2:24 PM

## 2013-09-01 ENCOUNTER — Telehealth: Payer: Self-pay

## 2013-09-01 NOTE — Telephone Encounter (Signed)
rec'd phone call from pt. 08/31/13 @ approx. 3:40 PM.  Reported that he could no longer hear the "buzzing" from his fistula.  Stated he 1st noticed it at 12:00 PM 12/30.  Stated unable to feel a pulsation @ AVF site left wrist.  Discussed with Dr. Kellie Simmering.  Noted last office visit of 08/17/13; :duplex "showed good flow; no evidence of stenosis; diameter noted to be approx. 5 mm throughout its course."  Per Dr. Kellie Simmering, recommended to contact Dr. Augustin Coupe at [facility] to attempt to do a Thrombolysis to save fistula.  Contacted Monique on 12/31 @ 7:50 AM.  Beckie Busing discussed with Dr. Augustin Coupe, and procedure planned today at [facility].

## 2013-09-10 ENCOUNTER — Telehealth: Payer: Self-pay | Admitting: Family Medicine

## 2013-09-10 NOTE — Telephone Encounter (Signed)
Pt called and informed that personal records are here and available for pickup. Pt aware and will pick up  Linna Darner, MD Family Medicine PGY-3 09/10/2013, 3:31 PM

## 2013-09-22 ENCOUNTER — Encounter: Payer: Self-pay | Admitting: Vascular Surgery

## 2013-09-23 ENCOUNTER — Ambulatory Visit (INDEPENDENT_AMBULATORY_CARE_PROVIDER_SITE_OTHER): Payer: Self-pay | Admitting: Vascular Surgery

## 2013-09-23 ENCOUNTER — Encounter: Payer: Self-pay | Admitting: Vascular Surgery

## 2013-09-23 VITALS — BP 126/73 | HR 87 | Ht 73.5 in | Wt 232.0 lb

## 2013-09-23 DIAGNOSIS — N186 End stage renal disease: Secondary | ICD-10-CM

## 2013-09-23 NOTE — Progress Notes (Signed)
Patient is a 63 year old male who presents today for followup after recent thrombolysis of his left arm AV fistula. The fistula was placed in October by Dr. Donnetta Hutching. On recent thrombolysis apparently there were multiple side branches found. I spoke with Dr. Augustin Coupe by phone today. He feels that ligation of the side branches would improve the overall development of the fistula and make for easier cannulation.  There is no significant narrowing of the fistula. The patient denies any numbness or tingling in his hand. Chronic medical problems include hypertension diabetes but which are stable. His dialysis days Monday Wednesday and Friday in [place].  Review of systems: He denies shortness of breath. He denies chest pain.  Past Medical History  Diagnosis Date  . Hypertension   . Diabetes mellitus without complication   . Rheumatoid arthritis    Current Outpatient Prescriptions on File Prior to Visit  Medication Sig Dispense Refill  . acetaminophen (TYLENOL) 500 MG tablet Take 2 tablets (1,000 mg total) by mouth 3 (three) times daily as needed for pain.      Marland Kitchen aspirin 81 MG EC tablet Take 1 tablet (81 mg total) by mouth daily. Swallow whole.      Marland Kitchen atorvastatin (LIPITOR) 80 MG tablet Take 1 tablet (80 mg total) by mouth daily.  90 tablet  3  . calcium acetate (PHOSLO) 667 MG capsule TAKE 1 CAPSULE BY MOUTH THREE TIMES DAILY WITH MEALS  90 capsule  3  . cloNIDine (CATAPRES) 0.2 MG tablet Take 1 tablet (0.2 mg total) by mouth 2 (two) times daily.  180 tablet  3  . diltiazem (DILACOR XR) 180 MG 24 hr capsule Take 1 capsule (180 mg total) by mouth daily.  90 capsule  3  . glucose blood (TRUETEST TEST) test strip Use as instructed  100 each  12  . insulin aspart (NOVOLOG FLEXPEN) 100 UNIT/ML SOPN FlexPen Inject 5 Units into the skin 3 (three) times daily with meals.  5 pen  0  . Insulin Glargine 100 UNIT/ML SOPN Inject 20 Units into the skin every morning. Increase by 5 units if morning CBG is greater than  200 and decrease by 5 units if morning CBG is less than 150  5 pen  5  . Insulin Pen Needle (PEN NEEDLES) 31G X 6 MM MISC Use 1 needle per injection as directed  100 each  1  . Multiple Vitamins-Minerals (MULTIVITAMIN PO) Take 1 tablet by mouth daily.      . pantoprazole (PROTONIX) 20 MG tablet TAKE 1 TABLET BY MOUTH DAILY  30 tablet  0  . predniSONE (DELTASONE) 20 MG tablet TAKE 3 TABLETS BY MOUTH DAILY WITH BREAKFAST  30 tablet  1   No current facility-administered medications on file prior to visit.   Allergies  Allergen Reactions  . Glyburide   . Methotrexate Derivatives Other (See Comments)    Patient cannot remember    Physical exam:  Filed Vitals:   09/23/13 1206  BP: 126/73  Pulse: 87  Height: 6' 1.5" (1.867 m)  Weight: 232 lb (105.235 kg)  SpO2: 100%    Left upper extremity: Easily palpable thrill left forearm however the vein is not completely prominent Chest: Clear to auscultation bilaterally  Cardiac: Regular rate and rhythm  Data: The patient had a duplex ultrasound of his AV fistula today. This shows multiple side branches with 2 larger branches up near the antecubital area and a duplicated system with multiple branches in the distal forearm  Assessment: AV fistula  with difficult cannulation multiple side branches  Plan: Ligation of multiple side branches 10/12/2013. Risks benefits possible complications and procedure details were explained the patient today. He understands and agrees to proceed.  Ruta Hinds, MD [facility] Office: [phone] Pager: [phone]

## 2013-09-24 ENCOUNTER — Other Ambulatory Visit: Payer: Self-pay | Admitting: *Deleted

## 2013-09-29 ENCOUNTER — Encounter (HOSPITAL_COMMUNITY): Payer: Self-pay | Admitting: Pharmacy Technician

## 2013-10-07 ENCOUNTER — Telehealth: Payer: Self-pay | Admitting: Family Medicine

## 2013-10-07 NOTE — Telephone Encounter (Signed)
Call from pt's wife with concerns that pt has decrease appetite, decrease fluid intake and fever of 102.6.  Wife gave pt tylenol for the fever.  Asked wife if she could like to bring pt in at 1:30 PM today, go to urgent care or call the pt's nephrologis.  Wife opt to call the pt's nephrologist.  Derl Barrow, RN

## 2013-10-11 ENCOUNTER — Encounter (HOSPITAL_COMMUNITY): Payer: Self-pay | Admitting: *Deleted

## 2013-10-11 MED ORDER — DEXTROSE 5 % IV SOLN
1.5000 g | INTRAVENOUS | Status: AC
  Administered 2013-10-12: 1.5 g via INTRAVENOUS
  Filled 2013-10-11: qty 1.5

## 2013-10-11 MED ORDER — SODIUM CHLORIDE 0.9 % IV SOLN
INTRAVENOUS | Status: DC
  Administered 2013-10-12: 07:00:00 via INTRAVENOUS

## 2013-10-11 NOTE — Progress Notes (Signed)
10/11/13 1250  OBSTRUCTIVE SLEEP APNEA  Have you ever been diagnosed with sleep apnea through a sleep study? No  Do you snore loudly (loud enough to be heard through closed doors)?  0  Do you often feel tired, fatigued, or sleepy during the daytime? 1  Has anyone observed you stop breathing during your sleep? 0  Do you have, or are you being treated for high blood pressure? 1  BMI more than 35 kg/m2? 0  Age over 64 years old? 1  Neck circumference greater than 40 cm/18 inches? 0  Gender: 1  Obstructive Sleep Apnea Score 4  Score 4 or greater  Results sent to PCP

## 2013-10-12 ENCOUNTER — Encounter (HOSPITAL_COMMUNITY): Payer: 59 | Admitting: Anesthesiology

## 2013-10-12 ENCOUNTER — Ambulatory Visit (HOSPITAL_COMMUNITY): Payer: 59 | Admitting: Anesthesiology

## 2013-10-12 ENCOUNTER — Encounter (HOSPITAL_COMMUNITY)
Admission: RE | Disposition: A | Discharge status: Home / Self Care | Payer: Self-pay | Source: Ambulatory Visit | Attending: Vascular Surgery

## 2013-10-12 ENCOUNTER — Ambulatory Visit (HOSPITAL_COMMUNITY)
Admission: RE | Admit: 2013-10-12 | Discharge: 2013-10-12 | Disposition: A | Discharge status: Home / Self Care | Payer: 59 | Source: Ambulatory Visit | Attending: Vascular Surgery | Admitting: Vascular Surgery

## 2013-10-12 ENCOUNTER — Other Ambulatory Visit: Payer: Self-pay | Admitting: *Deleted

## 2013-10-12 ENCOUNTER — Encounter (HOSPITAL_COMMUNITY): Payer: Self-pay | Admitting: Anesthesiology

## 2013-10-12 ENCOUNTER — Telehealth: Payer: Self-pay | Admitting: Vascular Surgery

## 2013-10-12 ENCOUNTER — Ambulatory Visit (HOSPITAL_COMMUNITY): Payer: 59

## 2013-10-12 DIAGNOSIS — N186 End stage renal disease: Secondary | ICD-10-CM

## 2013-10-12 DIAGNOSIS — T82190A Other mechanical complication of cardiac electrode, initial encounter: Secondary | ICD-10-CM | POA: Insufficient documentation

## 2013-10-12 DIAGNOSIS — M069 Rheumatoid arthritis, unspecified: Secondary | ICD-10-CM | POA: Insufficient documentation

## 2013-10-12 DIAGNOSIS — Z992 Dependence on renal dialysis: Secondary | ICD-10-CM | POA: Insufficient documentation

## 2013-10-12 DIAGNOSIS — I120 Hypertensive chronic kidney disease with stage 5 chronic kidney disease or end stage renal disease: Secondary | ICD-10-CM | POA: Insufficient documentation

## 2013-10-12 DIAGNOSIS — Y832 Surgical operation with anastomosis, bypass or graft as the cause of abnormal reaction of the patient, or of later complication, without mention of misadventure at the time of the procedure: Secondary | ICD-10-CM | POA: Insufficient documentation

## 2013-10-12 DIAGNOSIS — E119 Type 2 diabetes mellitus without complications: Secondary | ICD-10-CM | POA: Insufficient documentation

## 2013-10-12 DIAGNOSIS — Z87891 Personal history of nicotine dependence: Secondary | ICD-10-CM | POA: Insufficient documentation

## 2013-10-12 DIAGNOSIS — Z4931 Encounter for adequacy testing for hemodialysis: Secondary | ICD-10-CM

## 2013-10-12 DIAGNOSIS — Q278 Other specified congenital malformations of peripheral vascular system: Secondary | ICD-10-CM | POA: Insufficient documentation

## 2013-10-12 DIAGNOSIS — T82898A Other specified complication of vascular prosthetic devices, implants and grafts, initial encounter: Secondary | ICD-10-CM

## 2013-10-12 DIAGNOSIS — Z7982 Long term (current) use of aspirin: Secondary | ICD-10-CM | POA: Insufficient documentation

## 2013-10-12 DIAGNOSIS — Z794 Long term (current) use of insulin: Secondary | ICD-10-CM | POA: Insufficient documentation

## 2013-10-12 HISTORY — DX: Chronic kidney disease, unspecified: N18.9

## 2013-10-12 HISTORY — PX: LIGATION OF COMPETING BRANCHES OF ARTERIOVENOUS FISTULA: SHX5949

## 2013-10-12 HISTORY — DX: Vitamin D deficiency, unspecified: E55.9

## 2013-10-12 LAB — GLUCOSE, CAPILLARY
GLUCOSE-CAPILLARY: 186 mg/dL — AB (ref 70–99)
GLUCOSE-CAPILLARY: 158 mg/dL — AB (ref 70–99)

## 2013-10-12 LAB — POCT I-STAT 4, (NA,K, GLUC, HGB,HCT)
Glucose, Bld: 194 mg/dL — ABNORMAL HIGH (ref 70–99)
HCT: 24 % — ABNORMAL LOW (ref 39.0–52.0)
Hemoglobin: 8.2 g/dL — ABNORMAL LOW (ref 13.0–17.0)
Potassium: 3.2 mEq/L — ABNORMAL LOW (ref 3.7–5.3)
Sodium: 137 mEq/L (ref 137–147)

## 2013-10-12 SURGERY — LIGATION OF COMPETING BRANCHES OF ARTERIOVENOUS FISTULA
Anesthesia: General | Laterality: Left

## 2013-10-12 MED ORDER — TRAMADOL HCL 50 MG PO TABS: 50.0000 mg | ORAL_TABLET | Freq: Four times a day (QID) | ORAL | Status: DC | PRN

## 2013-10-12 MED ORDER — OXYCODONE HCL 5 MG/5ML PO SOLN: 5.0000 mg | Freq: Once | ORAL | Status: DC | PRN

## 2013-10-12 MED ORDER — MIDAZOLAM HCL 2 MG/2ML IJ SOLN
INTRAMUSCULAR | Status: AC
  Filled 2013-10-12: qty 2

## 2013-10-12 MED ORDER — LIDOCAINE HCL (PF) 1 % IJ SOLN: INTRAMUSCULAR | Status: DC | PRN

## 2013-10-12 MED ORDER — ONDANSETRON HCL 4 MG/2ML IJ SOLN
INTRAMUSCULAR | Status: DC | PRN
  Administered 2013-10-12: 4 mg via INTRAVENOUS

## 2013-10-12 MED ORDER — ONDANSETRON HCL 4 MG/2ML IJ SOLN
INTRAMUSCULAR | Status: AC
  Filled 2013-10-12: qty 2

## 2013-10-12 MED ORDER — PHENYLEPHRINE 40 MCG/ML (10ML) SYRINGE FOR IV PUSH (FOR BLOOD PRESSURE SUPPORT)
PREFILLED_SYRINGE | INTRAVENOUS | Status: AC
  Filled 2013-10-12: qty 10

## 2013-10-12 MED ORDER — PHENYLEPHRINE HCL 10 MG/ML IJ SOLN
INTRAMUSCULAR | Status: DC | PRN
  Administered 2013-10-12 (×2): 80 ug via INTRAVENOUS

## 2013-10-12 MED ORDER — HYDROMORPHONE HCL PF 1 MG/ML IJ SOLN: 0.2500 mg | INTRAMUSCULAR | Status: DC | PRN

## 2013-10-12 MED ORDER — MIDAZOLAM HCL 5 MG/5ML IJ SOLN
INTRAMUSCULAR | Status: DC | PRN
  Administered 2013-10-12: 2 mg via INTRAVENOUS

## 2013-10-12 MED ORDER — LIDOCAINE HCL (PF) 1 % IJ SOLN
INTRAMUSCULAR | Status: AC
  Filled 2013-10-12: qty 30

## 2013-10-12 MED ORDER — ONDANSETRON HCL 4 MG/2ML IJ SOLN: 4.0000 mg | Freq: Once | INTRAMUSCULAR | Status: DC | PRN

## 2013-10-12 MED ORDER — MEPERIDINE HCL 25 MG/ML IJ SOLN: 6.2500 mg | INTRAMUSCULAR | Status: DC | PRN

## 2013-10-12 MED ORDER — FENTANYL CITRATE 0.05 MG/ML IJ SOLN
INTRAMUSCULAR | Status: AC
  Filled 2013-10-12: qty 5

## 2013-10-12 MED ORDER — LIDOCAINE HCL (CARDIAC) 20 MG/ML IV SOLN
INTRAVENOUS | Status: AC
  Filled 2013-10-12: qty 5

## 2013-10-12 MED ORDER — 0.9 % SODIUM CHLORIDE (POUR BTL) OPTIME
TOPICAL | Status: DC | PRN
  Administered 2013-10-12: 1000 mL

## 2013-10-12 MED ORDER — FENTANYL CITRATE 0.05 MG/ML IJ SOLN
INTRAMUSCULAR | Status: DC | PRN
  Administered 2013-10-12: 150 ug via INTRAVENOUS

## 2013-10-12 MED ORDER — OXYCODONE HCL 5 MG PO TABS: 5.0000 mg | ORAL_TABLET | Freq: Once | ORAL | Status: DC | PRN

## 2013-10-12 MED ORDER — PROPOFOL 10 MG/ML IV BOLUS
INTRAVENOUS | Status: AC
  Filled 2013-10-12: qty 20

## 2013-10-12 MED ORDER — LACTATED RINGERS IV SOLN: INTRAVENOUS | Status: DC | PRN

## 2013-10-12 MED ORDER — LIDOCAINE HCL (CARDIAC) 20 MG/ML IV SOLN
INTRAVENOUS | Status: DC | PRN
  Administered 2013-10-12: 100 mg via INTRAVENOUS

## 2013-10-12 MED ORDER — PROPOFOL 10 MG/ML IV BOLUS
INTRAVENOUS | Status: DC | PRN
  Administered 2013-10-12: 200 mg via INTRAVENOUS

## 2013-10-12 SURGICAL SUPPLY — 33 items
ADH SKN CLS APL DERMABOND .7 (GAUZE/BANDAGES/DRESSINGS) ×1
CANISTER SUCTION 2500CC (MISCELLANEOUS) ×2 IMPLANT
CLIP TI MEDIUM 6 (CLIP) ×1 IMPLANT
CLIP TI WIDE RED SMALL 6 (CLIP) ×1 IMPLANT
COVER SURGICAL LIGHT HANDLE (MISCELLANEOUS) ×2 IMPLANT
DERMABOND ADVANCED (GAUZE/BANDAGES/DRESSINGS) ×1
DERMABOND ADVANCED .7 DNX12 (GAUZE/BANDAGES/DRESSINGS) ×1 IMPLANT
ELECT REM PT RETURN 9FT ADLT (ELECTROSURGICAL) ×2
ELECTRODE REM PT RTRN 9FT ADLT (ELECTROSURGICAL) ×1 IMPLANT
GEL ULTRASOUND 20GR AQUASONIC (MISCELLANEOUS) ×2 IMPLANT
GLOVE BIO SURGEON STRL SZ7.5 (GLOVE) ×2 IMPLANT
GLOVE BIOGEL PI IND STRL 7.0 (GLOVE) IMPLANT
GLOVE BIOGEL PI IND STRL 7.5 (GLOVE) IMPLANT
GLOVE BIOGEL PI INDICATOR 7.0 (GLOVE) ×2
GLOVE BIOGEL PI INDICATOR 7.5 (GLOVE) ×1
GOWN STRL REUS W/ TWL LRG LVL3 (GOWN DISPOSABLE) ×3 IMPLANT
GOWN STRL REUS W/TWL LRG LVL3 (GOWN DISPOSABLE) ×6
KIT BASIN OR (CUSTOM PROCEDURE TRAY) ×2 IMPLANT
KIT ROOM TURNOVER OR (KITS) ×2 IMPLANT
LOOP VESSEL MINI RED (MISCELLANEOUS) IMPLANT
NS IRRIG 1000ML POUR BTL (IV SOLUTION) ×2 IMPLANT
PACK CV ACCESS (CUSTOM PROCEDURE TRAY) ×2 IMPLANT
PAD ARMBOARD 7.5X6 YLW CONV (MISCELLANEOUS) ×4 IMPLANT
SPONGE SURGIFOAM ABS GEL 100 (HEMOSTASIS) IMPLANT
SUT PROLENE 6 0 CC (SUTURE) IMPLANT
SUT SILK 0 (SUTURE) IMPLANT
SUT VIC AB 3-0 SH 27 (SUTURE) ×2
SUT VIC AB 3-0 SH 27X BRD (SUTURE) ×1 IMPLANT
SUT VICRYL 4-0 PS2 18IN ABS (SUTURE) ×2 IMPLANT
TOWEL OR 17X24 6PK STRL BLUE (TOWEL DISPOSABLE) ×2 IMPLANT
TOWEL OR 17X26 10 PK STRL BLUE (TOWEL DISPOSABLE) ×2 IMPLANT
UNDERPAD 30X30 INCONTINENT (UNDERPADS AND DIAPERS) ×2 IMPLANT
WATER STERILE IRR 1000ML POUR (IV SOLUTION) ×2 IMPLANT

## 2013-10-12 NOTE — Telephone Encounter (Addendum)
Message copied by Gena Fray on Tue Oct 12, 2013 12:59 PM ------      Message from: Mena Goes      Created: Tue Oct 12, 2013 10:51 AM      Regarding: schedule                   ----- Message -----         From: Elam Dutch, MD         Sent: 10/12/2013   8:53 AM           To: [facility] Charge Pool            Side branch ligation x3       Ultrasound guided            He needs follow up 4-6 weeks with duplex of fistula at that time            Juanda Crumble ------  10/12/13: spoke with pt, dpm

## 2013-10-12 NOTE — H&P (View-Only) (Signed)
Patient is a 63 year old male who presents today for followup after recent thrombolysis of his left arm AV fistula. The fistula was placed in October by Dr. Donnetta Hutching. On recent thrombolysis apparently there were multiple side branches found. I spoke with Dr. Augustin Coupe by phone today. He feels that ligation of the side branches would improve the overall development of the fistula and make for easier cannulation.  There is no significant narrowing of the fistula. The patient denies any numbness or tingling in his hand. Chronic medical problems include hypertension diabetes but which are stable. His dialysis days Monday Wednesday and Friday in [place].  Review of systems: He denies shortness of breath. He denies chest pain.  Past Medical History  Diagnosis Date  . Hypertension   . Diabetes mellitus without complication   . Rheumatoid arthritis    Current Outpatient Prescriptions on File Prior to Visit  Medication Sig Dispense Refill  . acetaminophen (TYLENOL) 500 MG tablet Take 2 tablets (1,000 mg total) by mouth 3 (three) times daily as needed for pain.      Marland Kitchen aspirin 81 MG EC tablet Take 1 tablet (81 mg total) by mouth daily. Swallow whole.      Marland Kitchen atorvastatin (LIPITOR) 80 MG tablet Take 1 tablet (80 mg total) by mouth daily.  90 tablet  3  . calcium acetate (PHOSLO) 667 MG capsule TAKE 1 CAPSULE BY MOUTH THREE TIMES DAILY WITH MEALS  90 capsule  3  . cloNIDine (CATAPRES) 0.2 MG tablet Take 1 tablet (0.2 mg total) by mouth 2 (two) times daily.  180 tablet  3  . diltiazem (DILACOR XR) 180 MG 24 hr capsule Take 1 capsule (180 mg total) by mouth daily.  90 capsule  3  . glucose blood (TRUETEST TEST) test strip Use as instructed  100 each  12  . insulin aspart (NOVOLOG FLEXPEN) 100 UNIT/ML SOPN FlexPen Inject 5 Units into the skin 3 (three) times daily with meals.  5 pen  0  . Insulin Glargine 100 UNIT/ML SOPN Inject 20 Units into the skin every morning. Increase by 5 units if morning CBG is greater than  200 and decrease by 5 units if morning CBG is less than 150  5 pen  5  . Insulin Pen Needle (PEN NEEDLES) 31G X 6 MM MISC Use 1 needle per injection as directed  100 each  1  . Multiple Vitamins-Minerals (MULTIVITAMIN PO) Take 1 tablet by mouth daily.      . pantoprazole (PROTONIX) 20 MG tablet TAKE 1 TABLET BY MOUTH DAILY  30 tablet  0  . predniSONE (DELTASONE) 20 MG tablet TAKE 3 TABLETS BY MOUTH DAILY WITH BREAKFAST  30 tablet  1   No current facility-administered medications on file prior to visit.   Allergies  Allergen Reactions  . Glyburide   . Methotrexate Derivatives Other (See Comments)    Patient cannot remember    Physical exam:  Filed Vitals:   09/23/13 1206  BP: 126/73  Pulse: 87  Height: 6' 1.5" (1.867 m)  Weight: 232 lb (105.235 kg)  SpO2: 100%    Left upper extremity: Easily palpable thrill left forearm however the vein is not completely prominent Chest: Clear to auscultation bilaterally  Cardiac: Regular rate and rhythm  Data: The patient had a duplex ultrasound of his AV fistula today. This shows multiple side branches with 2 larger branches up near the antecubital area and a duplicated system with multiple branches in the distal forearm  Assessment: AV fistula  with difficult cannulation multiple side branches  Plan: Ligation of multiple side branches 10/12/2013. Risks benefits possible complications and procedure details were explained the patient today. He understands and agrees to proceed.  Ruta Hinds, MD [facility] [place] Office: [phone] Pager: [phone]

## 2013-10-12 NOTE — Discharge Instructions (Signed)

## 2013-10-12 NOTE — Preoperative (Signed)
Beta Blockers   Reason not to administer Beta Blockers:Not Applicable 

## 2013-10-12 NOTE — Anesthesia Postprocedure Evaluation (Signed)
Anesthesia Post Note  Patient: Gary Lang  Procedure(s) Performed: Procedure(s) (LRB): LIGATION OF COMPETING BRANCHES OF ARTERIOVENOUS FISTULA (Left)  Anesthesia type: general  Patient location: PACU  Post pain: Pain level controlled  Post assessment: Patient's Cardiovascular Status Stable  Last Vitals:  Filed Vitals:   10/12/13 1020  BP:   Pulse:   Temp: 36.2 C  Resp:     Post vital signs: Reviewed and stable  Level of consciousness: sedated  Complications: No apparent anesthesia complications

## 2013-10-12 NOTE — Transfer of Care (Signed)
Immediate Anesthesia Transfer of Care Note  Patient: Gary Lang  Procedure(s) Performed: Procedure(s): LIGATION OF COMPETING BRANCHES OF ARTERIOVENOUS FISTULA (Left)  Patient Location: PACU  Anesthesia Type:General  Level of Consciousness: awake, oriented, patient cooperative and responds to stimulation  Airway & Oxygen Therapy: Patient Spontanous Breathing and Patient connected to face mask oxygen  Post-op Assessment: Report given to PACU RN and Post -op Vital signs reviewed and stable  Post vital signs: Reviewed and stable  Complications: No apparent anesthesia complications

## 2013-10-12 NOTE — Progress Notes (Signed)
Good bruit and thrill to left arm

## 2013-10-12 NOTE — Interval H&P Note (Signed)
History and Physical Interval Note:  10/12/2013 7:32 AM  Gary Lang  has presented today for surgery, with the diagnosis of ESRD  The various methods of treatment have been discussed with the patient and family. After consideration of risks, benefits and other options for treatment, the patient has consented to  Procedure(s): LIGATION OF COMPETING BRANCHES OF ARTERIOVENOUS FISTULA (Left) as a surgical intervention .  The patient's history has been reviewed, patient examined, no change in status, stable for surgery.  I have reviewed the patient's chart and labs.  Questions were answered to the patient's satisfaction.     FIELDS,CHARLES E

## 2013-10-12 NOTE — Anesthesia Preprocedure Evaluation (Signed)
Anesthesia Evaluation  Patient identified by MRN, date of birth, ID band Patient awake    Reviewed: Allergy & Precautions, H&P , NPO status , Patient's Chart, lab work & pertinent test results  Airway Mallampati: I TM Distance: >3 FB Neck ROM: Full    Dental   Pulmonary former smoker,          Cardiovascular hypertension, Pt. on medications     Neuro/Psych    GI/Hepatic   Endo/Other  diabetes, Well Controlled, Type 2, Insulin Dependent  Renal/GU Dialysis and CRFRenal disease     Musculoskeletal  (+) Arthritis -, Rheumatoid disorders,    Abdominal   Peds  Hematology   Anesthesia Other Findings   Reproductive/Obstetrics                           Anesthesia Physical Anesthesia Plan  ASA: III  Anesthesia Plan: General   Post-op Pain Management:    Induction: Intravenous  Airway Management Planned: LMA  Additional Equipment:   Intra-op Plan:   Post-operative Plan: Extubation in OR  Informed Consent: I have reviewed the patients History and Physical, chart, labs and discussed the procedure including the risks, benefits and alternatives for the proposed anesthesia with the patient or authorized representative who has indicated his/her understanding and acceptance.     Plan Discussed with: CRNA and Surgeon  Anesthesia Plan Comments:         Anesthesia Quick Evaluation

## 2013-10-12 NOTE — Op Note (Signed)
Procedure: Left Radial Cephalic AV Fistula side branch ligation, ultrasound PreOp: Poorly functioning AVF  PostOp: same  Anesthesia: General Asst: Nurse  Findings: 3 side branches ligated   Operative details: After obtaining informed consent, the patient was taken to the operating room. The patient was placed in supine position on the operating table. After commencing general anesthesia, the patient's entire left upper extremity was prepped and draped in the usual sterile fashion. Ultrasound was used to identify 3 side branches, 2 proximal and 1 mid.  A transverse incision was made over each side branch and the incision was carried onto the subcutaneous tissues down to level of the pre-existing AV fistula. The fistula was patent and did have a thrill within it. The mid forearm branch was 3 mm in diameter. This was ligated with a 2 0 silk tie. There were 2 smaller side branches in the more proximal fistula and these were also ligated with silk ties.  Next subcutaneous tissues of each incision were reapproximated using running 3-0 Vicryl suture. The skin was closed with 3 0 Vicryl subcuticular stitch. Dermabond was applied to each incision. The patient tolerated the procedure well and there were no complications. Instrument sponge and needle counts were correct at the end of the case. The patient was taken to the recovery room in stable condition.   Ruta Hinds, MD  [facility]  Office: [phone]  Pager: [phone]

## 2013-10-14 ENCOUNTER — Encounter (HOSPITAL_COMMUNITY): Payer: Self-pay | Admitting: Vascular Surgery

## 2013-11-04 ENCOUNTER — Other Ambulatory Visit: Payer: Self-pay | Admitting: Family Medicine

## 2013-11-04 ENCOUNTER — Ambulatory Visit (INDEPENDENT_AMBULATORY_CARE_PROVIDER_SITE_OTHER): Payer: 59 | Admitting: Family Medicine

## 2013-11-04 ENCOUNTER — Encounter: Payer: Self-pay | Admitting: Family Medicine

## 2013-11-04 VITALS — BP 123/78 | HR 96 | Temp 98.3°F | Wt 226.0 lb

## 2013-11-04 DIAGNOSIS — I10 Essential (primary) hypertension: Secondary | ICD-10-CM

## 2013-11-04 DIAGNOSIS — I770 Arteriovenous fistula, acquired: Secondary | ICD-10-CM

## 2013-11-04 DIAGNOSIS — R21 Rash and other nonspecific skin eruption: Secondary | ICD-10-CM | POA: Insufficient documentation

## 2013-11-04 DIAGNOSIS — E119 Type 2 diabetes mellitus without complications: Secondary | ICD-10-CM

## 2013-11-04 DIAGNOSIS — N186 End stage renal disease: Secondary | ICD-10-CM

## 2013-11-04 DIAGNOSIS — K219 Gastro-esophageal reflux disease without esophagitis: Secondary | ICD-10-CM | POA: Insufficient documentation

## 2013-11-04 LAB — POCT GLYCOSYLATED HEMOGLOBIN (HGB A1C): Hemoglobin A1C: 8.1

## 2013-11-04 MED ORDER — HYDROCORTISONE 2.5 % EX CREA: TOPICAL_CREAM | Freq: Two times a day (BID) | CUTANEOUS | Status: DC

## 2013-11-04 MED ORDER — KETOCONAZOLE 2 % EX CREA: 1.0000 "application " | TOPICAL_CREAM | Freq: Every day | CUTANEOUS | Status: DC

## 2013-11-04 MED ORDER — INSULIN ASPART 100 UNIT/ML FLEXPEN: 5.0000 [IU] | PEN_INJECTOR | Freq: Three times a day (TID) | SUBCUTANEOUS | Status: DC

## 2013-11-04 MED ORDER — PANTOPRAZOLE SODIUM 20 MG PO TBEC: 20.0000 mg | DELAYED_RELEASE_TABLET | Freq: Every day | ORAL | Status: DC | PRN

## 2013-11-04 NOTE — Addendum Note (Signed)
Addended by: Marily Memos, DAVID J on: 11/04/2013 12:03 PM   Modules accepted: Orders, Medications

## 2013-11-04 NOTE — Assessment & Plan Note (Signed)
Denies recent symptoms.  Cut back protonix to prn

## 2013-11-04 NOTE — Assessment & Plan Note (Signed)
Self reported A1c from outside facility of 8. Recheck A1c today Excellent control per pt and base on meter reads Keep current regimen

## 2013-11-04 NOTE — Progress Notes (Signed)
Gary Lang is a 63 y.o. male who presents to [facility] today for f/u  DM: CBG typically in the low to mid 100s. Taking insulin as prescribed. Currently on lantus 20 in the am w/ titration instrutions and on novolog 5 units TID. Discussed importance of not overdosing in the setting of ESRD  ESRD: on dialysis MWF. Doing well makes urine  Rash: located on R shoulder and around waist line from time to time. Present since starting dialysis. puritic and occasionally itchy  HTN: told by dialysis center not to take clonidine on dialysis days. Taking lasix QOD on non-dialysis days. Denies any hypiotensive symptoms. CP, SOB.   The following portions of the patient's history were reviewed and updated as appropriate: allergies, current medications, past medical history, family and social history, and problem list.  Patient is a nonsmoker.  Past Medical History  Diagnosis Date  . Hypertension   . Diabetes mellitus without complication   . Rheumatoid arthritis   . Chronic kidney disease   . Vitamin D deficiency     Hx: of    ROS as above otherwise neg.    Medications reviewed. Current Outpatient Prescriptions  Medication Sig Dispense Refill  . acetaminophen (TYLENOL) 500 MG tablet Take 2 tablets (1,000 mg total) by mouth 3 (three) times daily as needed for pain.      Marland Kitchen aspirin 81 MG EC tablet Take 1 tablet (81 mg total) by mouth daily. Swallow whole.      . cloNIDine (CATAPRES) 0.2 MG tablet Take 0.2 mg by mouth daily.      Marland Kitchen diltiazem (DILACOR XR) 180 MG 24 hr capsule Take 1 capsule (180 mg total) by mouth daily.  90 capsule  3  . furosemide (LASIX) 80 MG tablet Take 80 mg by mouth See admin instructions. Tuesday, Thursday and Saturday.      . hydrocortisone 2.5 % cream Apply topically 2 (two) times daily.  30 g  0  . insulin aspart (NOVOLOG FLEXPEN) 100 UNIT/ML SOPN FlexPen Inject 5 Units into the skin 3 (three) times daily with meals.  5 pen  0  . Insulin Glargine 100 UNIT/ML SOPN Inject 20 Units  into the skin every morning. Increase by 5 units if morning CBG is greater than 200 and decrease by 5 units if morning CBG is less than 150  5 pen  5  . Insulin Pen Needle (PEN NEEDLES) 31G X 6 MM MISC Use 1 needle per injection as directed  100 each  1  . ketoconazole (NIZORAL) 2 % cream Apply 1 application topically daily.  15 g  0  . pantoprazole (PROTONIX) 20 MG tablet Take 20 mg by mouth daily.      . predniSONE (DELTASONE) 20 MG tablet Take 10 mg by mouth daily with breakfast.      . traMADol (ULTRAM) 50 MG tablet Take 1 tablet (50 mg total) by mouth every 6 (six) hours as needed for moderate pain.  10 tablet  0  . Vitamin D, Ergocalciferol, (DRISDOL) 50000 UNITS CAPS capsule Take 50,000 Units by mouth every 7 (seven) days.       No current facility-administered medications for this visit.    Exam:  BP 123/78  Pulse 96  Temp(Src) 98.3 F (36.8 C) (Oral)  Wt 226 lb (102.513 kg) Gen: Well NAD HEENT: EOMI,  MMM Ext: L fistula patent and bruit present SKin: Dry patchy hyperpigmented skin of the R shoulderblade region  No results found for this or any previous visit (  from the past 72 hour(s)).  A/P (as seen in Problem list)  Rash and nonspecific skin eruption Likely eczematous but cannot r/o fungal (tinea versicolor) Start hydrocortisone 2.5% (tried otc hydrocortisone w/o much benefit) Start ketoconazole in 2 wks if not improving.   Diabetes mellitus Self reported A1c from outside facility of 8. Recheck A1c today Excellent control per pt and base on meter reads Keep current regimen   AVF (arteriovenous fistula) Intact and patent. No signs of infection  End stage renal disease Continues to make urine.  Continue dialysis 3xwkly Cont f/u w/ renal   Hypertension Pt well below goal and even too low on dialysis days. Stop clonidine completely Nursing BP check in 1-2 wks

## 2013-11-04 NOTE — Assessment & Plan Note (Signed)
Likely eczematous but cannot r/o fungal (tinea versicolor) Start hydrocortisone 2.5% (tried otc hydrocortisone w/o much benefit) Start ketoconazole in 2 wks if not improving.

## 2013-11-04 NOTE — Telephone Encounter (Signed)
Hey I got the Rx refill request because I prescribed it at discharge, but he's your pt so I'm sending it your way.

## 2013-11-04 NOTE — Assessment & Plan Note (Addendum)
Pt well below goal and even too low on dialysis days. Stop clonidine completely Nursing BP check in 1-2 wks Cont dilt

## 2013-11-04 NOTE — Assessment & Plan Note (Signed)
Continues to make urine.  Continue dialysis 3xwkly Cont f/u w/ renal

## 2013-11-04 NOTE — Patient Instructions (Signed)
You are doing great Only use the protonix if your reflux flares up Please stop taking your clonodine and come back for a nursing blood pressure check in 1-2 weeks on a non-dialysis day Please start the hydrocortisone cream twice daily for 2 weeks. If your rash does nott imporve then start the ketoconazole cream as this rash may be fungal in nature Please come back to see me in 3 months or sooner if needed.

## 2013-11-04 NOTE — Assessment & Plan Note (Signed)
Intact and patent. No signs of infection

## 2013-11-08 MED ORDER — INSULIN ASPART 100 UNIT/ML FLEXPEN: 5.0000 [IU] | PEN_INJECTOR | Freq: Three times a day (TID) | SUBCUTANEOUS | Status: DC

## 2013-11-11 ENCOUNTER — Telehealth: Payer: Self-pay | Admitting: Family Medicine

## 2013-11-11 NOTE — Telephone Encounter (Signed)
Marissa with [facility] would like the last clinic note. It would have his treatment plan Please fax to  [phone]

## 2013-11-11 NOTE — Telephone Encounter (Signed)
Copy of note fax to [facility]. Tehran Rabenold,CMA

## 2013-11-17 ENCOUNTER — Encounter: Payer: Self-pay | Admitting: Vascular Surgery

## 2013-11-18 ENCOUNTER — Ambulatory Visit (HOSPITAL_COMMUNITY)
Admission: RE | Admit: 2013-11-18 | Discharge: 2013-11-18 | Disposition: A | Discharge status: Home / Self Care | Payer: 59 | Source: Ambulatory Visit | Attending: Vascular Surgery | Admitting: Vascular Surgery

## 2013-11-18 ENCOUNTER — Encounter: Payer: Self-pay | Admitting: Vascular Surgery

## 2013-11-18 ENCOUNTER — Ambulatory Visit (INDEPENDENT_AMBULATORY_CARE_PROVIDER_SITE_OTHER): Payer: 59 | Admitting: Vascular Surgery

## 2013-11-18 ENCOUNTER — Ambulatory Visit (INDEPENDENT_AMBULATORY_CARE_PROVIDER_SITE_OTHER): Payer: 59 | Admitting: *Deleted

## 2013-11-18 VITALS — BP 160/70

## 2013-11-18 VITALS — BP 164/66 | HR 88 | Ht 73.5 in | Wt 226.0 lb

## 2013-11-18 DIAGNOSIS — Z136 Encounter for screening for cardiovascular disorders: Secondary | ICD-10-CM

## 2013-11-18 DIAGNOSIS — N186 End stage renal disease: Secondary | ICD-10-CM

## 2013-11-18 DIAGNOSIS — Z4931 Encounter for adequacy testing for hemodialysis: Secondary | ICD-10-CM | POA: Insufficient documentation

## 2013-11-18 DIAGNOSIS — Z0130 Encounter for examination of blood pressure without abnormal findings: Secondary | ICD-10-CM

## 2013-11-18 NOTE — Progress Notes (Signed)
Patient is a 63 year old male who returns today for postoperative followup after ligation of side branches of his left radiocephalic AV fistula. He states that he has had several good cannulations of the fistula recently. His right-sided dialysis catheter has now been removed.  Data: Patient had a duplex of his AV fistula today. The fistula diameter is 5-9 mm. It is just under 9 mm in depth throughout most of its course  Assessment: Maturing left radial cephalic AV fistula  Plan: Followup as needed if there are problems difficulties cannulating his fistula in the future  Ruta Hinds, MD [facility] [place]: [phone] Pager: [phone]

## 2013-11-18 NOTE — Progress Notes (Signed)
   Pt in clinic for blood pressure check.  Blood pressure 160/70 manually.  Pt denies any symptoms today, just wanted blood pressure check.  Derl Barrow, RN

## 2013-11-19 NOTE — Progress Notes (Signed)
BP noted. Will recheck at next appt. No change at this time

## 2013-11-29 ENCOUNTER — Other Ambulatory Visit: Payer: Self-pay | Admitting: Family Medicine

## 2013-12-08 ENCOUNTER — Ambulatory Visit: Payer: 59 | Admitting: *Deleted

## 2013-12-14 ENCOUNTER — Ambulatory Visit: Payer: 59 | Admitting: *Deleted

## 2014-01-20 ENCOUNTER — Encounter: Payer: 59 | Attending: Family Medicine | Admitting: *Deleted

## 2014-01-20 ENCOUNTER — Encounter: Payer: Self-pay | Admitting: *Deleted

## 2014-01-20 VITALS — Ht 73.5 in | Wt 214.2 lb

## 2014-01-20 DIAGNOSIS — Z713 Dietary counseling and surveillance: Secondary | ICD-10-CM | POA: Insufficient documentation

## 2014-01-20 DIAGNOSIS — E119 Type 2 diabetes mellitus without complications: Secondary | ICD-10-CM

## 2014-01-20 NOTE — Progress Notes (Signed)
Mr. Gary Lang returns today for 4 month f/u after completion of [employer]. He is accompanied by his wife who is a Adult nurse. Mr. Gary Lang was previously a renal dialysis patient and has been able to discontinue. He has an appointment with Dr. Marily Lang for further evaluation this week.  Glucose: A1c decreased from 11.1% to present 8.3%                 7 day average 247mg /dl, 14 day average 283mg /dl, 30 day average 340mg /dl                 FBS range: 217-295mg /dl          Pre-meal 166-220mg /dl Mr. Gary Lang is taking prednisone PRN for arthritic pain which increases glucose readings.   Medication: Mr. Gary Lang is not testing his pre-dinner glucose or taking that insulin dose. He is eating a heavy carbohydrate snack before bed resulting in grossly elevated FBS.  I began a new log book for him and showed him the pattern of his glucose readings.  Exercise: has walked 30 minutes one time this week. He used to walk regularly with a group until he developed a pain in his foot. This seems to be resolving and he intends to return to walking every morning with his neighbors.  Nutrition: Good choices and portion control in most cases. Before bed snack consists of Cereal, banana, milk. We discussed less carbohydrate alternatives.  PLAN: Please log all glucose readings in log book Be sure to test glucose before dinner and take insulin shot Decrease amount of snack you are having before bed. Talk to Dr. Ronnie Lang about NSAID to substitute for prednisone  Exercise: Walk 3 miles - 30 minutes Consider [place] for Snack  Follow up with Gary Churn RN, CDE, CCM with [facility] through [facility] to [facility].

## 2014-01-20 NOTE — Patient Instructions (Signed)
Please log all glucose readings in log book Be sure to test glucose before dinner and take insulin shot Decrease amount of snack you are having before bed. Talk to Dr. Ronnie Derby about NSAID to substitute for prednisone  Exercise: Walk 3 miles - 30 minutes Consider [place] for [employer]

## 2014-01-31 ENCOUNTER — Encounter: Payer: Self-pay | Admitting: Family Medicine

## 2014-01-31 ENCOUNTER — Ambulatory Visit (INDEPENDENT_AMBULATORY_CARE_PROVIDER_SITE_OTHER): Payer: 59 | Admitting: Family Medicine

## 2014-01-31 VITALS — BP 145/80 | HR 90 | Temp 98.4°F | Ht 73.5 in | Wt 214.0 lb

## 2014-01-31 DIAGNOSIS — E119 Type 2 diabetes mellitus without complications: Secondary | ICD-10-CM

## 2014-01-31 DIAGNOSIS — M069 Rheumatoid arthritis, unspecified: Secondary | ICD-10-CM

## 2014-01-31 DIAGNOSIS — I10 Essential (primary) hypertension: Secondary | ICD-10-CM

## 2014-01-31 DIAGNOSIS — N529 Male erectile dysfunction, unspecified: Secondary | ICD-10-CM

## 2014-01-31 DIAGNOSIS — N032 Chronic nephritic syndrome with diffuse membranous glomerulonephritis: Secondary | ICD-10-CM

## 2014-01-31 DIAGNOSIS — N186 End stage renal disease: Secondary | ICD-10-CM

## 2014-01-31 DIAGNOSIS — N051 Unspecified nephritic syndrome with focal and segmental glomerular lesions: Secondary | ICD-10-CM

## 2014-01-31 LAB — POCT GLYCOSYLATED HEMOGLOBIN (HGB A1C): Hemoglobin A1C: 11.4

## 2014-01-31 MED ORDER — INSULIN GLARGINE 100 UNIT/ML SOLOSTAR PEN: 20.0000 [IU] | PEN_INJECTOR | Freq: Every morning | SUBCUTANEOUS | Status: DC

## 2014-01-31 MED ORDER — HYDROCORTISONE 2.5 % EX CREA: TOPICAL_CREAM | Freq: Two times a day (BID) | CUTANEOUS | Status: DC

## 2014-01-31 MED ORDER — INSULIN ASPART 100 UNIT/ML FLEXPEN: 7.5000 [IU] | PEN_INJECTOR | Freq: Three times a day (TID) | SUBCUTANEOUS | Status: DC

## 2014-01-31 NOTE — Progress Notes (Signed)
Gary Lang is a 63 y.o. male who presents to [facility] today for routine f/u  Renal failure: pt no longer on dialysis!!!  Occasional joint pain. No longer on prednisone. No longer on orencia.   CBG: sugars range around the 200+ range. Taking 20 lantus and 5 units novolog TID.   HTN: BP at home around 160/90. Taking Dilt daily. Lasix 80mg  once daily. Stopped clonidine. States he will restart. Denies CP, SOB, syncope.   The following portions of the patient's history were reviewed and updated as appropriate: allergies, current medications, past medical history, family and social history, and problem list.  Patient is a nonsmoker.  Past Medical History  Diagnosis Date  . Hypertension   . Diabetes mellitus without complication   . Rheumatoid arthritis   . Chronic kidney disease   . Vitamin D deficiency     Hx: of    ROS as above otherwise neg.    Medications reviewed. Current Outpatient Prescriptions  Medication Sig Dispense Refill  . diltiazem (DILACOR XR) 180 MG 24 hr capsule Take 1 capsule (180 mg total) by mouth daily.  90 capsule  3  . furosemide (LASIX) 80 MG tablet Take 80 mg by mouth See admin instructions. Tuesday, Thursday and Saturday.      Marland Kitchen acetaminophen (TYLENOL) 500 MG tablet Take 2 tablets (1,000 mg total) by mouth 3 (three) times daily as needed for pain.      Marland Kitchen aspirin 81 MG EC tablet Take 1 tablet (81 mg total) by mouth daily. Swallow whole.      . cloNIDine (CATAPRES) 0.2 MG tablet Take 0.2 mg by mouth daily.      . hydrocortisone 2.5 % cream Apply topically 2 (two) times daily.  30 g  0  . insulin aspart (NOVOLOG FLEXPEN) 100 UNIT/ML FlexPen Inject 7.5 Units into the skin 3 (three) times daily with meals.  5 pen  6  . Insulin Glargine (LANTUS) 100 UNIT/ML Solostar Pen Inject 20 Units into the skin every morning. Increase by 5 units if morning CBG is greater than 200 and decrease by 5 units if morning CBG is less than 150  5 pen  5  . Insulin Pen Needle (PEN NEEDLES)  31G X 6 MM MISC Use 1 needle per injection as directed  100 each  1  . ketoconazole (NIZORAL) 2 % cream APPLY TOPICALLY DAILY  15 g  0  . pantoprazole (PROTONIX) 20 MG tablet Take 1 tablet (20 mg total) by mouth daily as needed.  30 tablet  1  . UNIFINE PENTIPS 31G X 5 MM MISC USE AS DIRECTED WITH INSULIN  100 each  PRN  . Vitamin D, Ergocalciferol, (DRISDOL) 50000 UNITS CAPS capsule Take 50,000 Units by mouth every 7 (seven) days.       No current facility-administered medications for this visit.    Exam:  BP 145/80  Pulse 90  Temp(Src) 98.4 F (36.9 C) (Oral)  Ht 6' 1.5" (1.867 m)  Wt 214 lb (97.07 kg)  BMI 27.85 kg/m2 Gen: Well NAD HEENT: EOMI,  MMM Lungs: CTABL Nl WOB Heart: RRR no MRG Abd: NABS, NT, ND Exts: trace LE edema  Results for orders placed in visit on 01/31/14 (from the past 72 hour(s))  POCT GLYCOSYLATED HEMOGLOBIN (HGB A1C)     Status: Abnormal   Collection Time    01/31/14  1:57 PM      Result Value Ref Range   Hemoglobin A1C 11.4      A/P (as  seen in Problem list)  Glomerulonephritis, focal sclerosing Seemingly resolved.  No longer on dialysis.  Continue routine f/u per nephrology.   Hypertension Elevated today Not taking clonidine - pt to restart F/u renal as they are dosing his BP meds at this time Continue lasix and dilt.  Rheumatoid arthritis Pt very nervous about RA meds causing worse renal failure recommendign tylenol 1000 Q8hrs  End stage renal disease Improved. No longer on dialysis  Diabetes mellitus Increase in A1c likely from return of renal function causing increased excretion of insulin.  Gave titration recommendations.  Lantus inc by 5 units if greater than 200 and decrease by 5 units if less than 125 Increase novolog to 7.5 units. TIDAC

## 2014-01-31 NOTE — Patient Instructions (Signed)
You are doing great Please remember to follow the instructions below when dosing your lantus:  Inject 20 Units into the skin every morning. Increase by 5 units if morning CBG is greater than 200 and decrease by 5 units if morning CBG is less than 150  EXAMPLE: morning sugar 230 inject 25 units, if the following morning your sugar is 175 then inject 25 units again. If on another day your sugar is 115 then decrease your dose to 20 units.   Please restart your clonidine  Please follow up with [place] in 3 months or sooner if needed.   Remember to use 1000mg  tylenol up to every 8 ghours as needed for pain.

## 2014-01-31 NOTE — Assessment & Plan Note (Signed)
Pt very nervous about RA meds causing worse renal failure recommendign tylenol 1000 Q8hrs

## 2014-01-31 NOTE — Assessment & Plan Note (Signed)
Improved. No longer on dialysis

## 2014-01-31 NOTE — Assessment & Plan Note (Signed)
Seemingly resolved.  No longer on dialysis.  Continue routine f/u per nephrology.

## 2014-01-31 NOTE — Assessment & Plan Note (Signed)
Elevated today Not taking clonidine - pt to restart F/u renal as they are dosing his BP meds at this time Continue lasix and dilt.

## 2014-01-31 NOTE — Assessment & Plan Note (Addendum)
Increase in A1c likely from return of renal function causing increased excretion of insulin.  Gave titration recommendations.  Lantus inc by 5 units if greater than 200 and decrease by 5 units if less than 125 Increase novolog to 7.5 units. TIDAC

## 2014-01-31 NOTE — Addendum Note (Signed)
Addended by: Waldemar Dickens on: 01/31/2014 02:49 PM   Modules accepted: Orders

## 2014-02-01 ENCOUNTER — Telehealth: Payer: Self-pay | Admitting: Family Medicine

## 2014-02-01 DIAGNOSIS — N529 Male erectile dysfunction, unspecified: Secondary | ICD-10-CM | POA: Insufficient documentation

## 2014-02-01 MED ORDER — SILDENAFIL CITRATE 50 MG PO TABS: 25.0000 mg | ORAL_TABLET | Freq: Every day | ORAL | Status: DC | PRN

## 2014-02-01 NOTE — Assessment & Plan Note (Addendum)
Rx for viagra sent May break viagra to make it last longer

## 2014-02-01 NOTE — Telephone Encounter (Signed)
Called in.

## 2014-02-01 NOTE — Telephone Encounter (Signed)
Wife calls, patient was supposed to be given a RX for Viagra yesterday but it was never sent to pharmacy. Please send and call patient once completed.

## 2014-02-01 NOTE — Addendum Note (Signed)
Addended by: Marily Memos, DAVID J on: 02/01/2014 02:06 PM   Modules accepted: Orders

## 2014-05-30 ENCOUNTER — Ambulatory Visit: Payer: 59 | Admitting: Family Medicine

## 2014-09-05 ENCOUNTER — Other Ambulatory Visit: Payer: Self-pay | Admitting: *Deleted

## 2014-09-05 DIAGNOSIS — E119 Type 2 diabetes mellitus without complications: Secondary | ICD-10-CM

## 2014-09-05 MED ORDER — INSULIN GLARGINE 100 UNIT/ML SOLOSTAR PEN: 20.0000 [IU] | PEN_INJECTOR | Freq: Every morning | SUBCUTANEOUS | Status: DC

## 2014-09-05 NOTE — Addendum Note (Signed)
Addended by: Aquilla Hacker on: 09/05/2014 02:13 PM   Modules accepted: Orders

## 2014-09-05 NOTE — Telephone Encounter (Signed)
Refilled

## 2014-09-05 NOTE — Telephone Encounter (Signed)
Re- prescribed and sent to pharmacy due to first prescription being printed instead of sent accidentally.   CGM

## 2014-11-28 ENCOUNTER — Other Ambulatory Visit: Payer: Self-pay | Admitting: *Deleted

## 2014-11-28 ENCOUNTER — Encounter: Payer: Self-pay | Admitting: *Deleted

## 2014-11-28 NOTE — Patient Outreach (Signed)
Gary Lang is here for Type II DM [facility] follow up. He says he is currently bothered with a flare of his rheumatoid arthritis in his left knee that is interfering with his daily walks of 5 miles. He also says he will be going on a Medicare health insurance plan next month and is asking if he can continue to receive [facility] services for assist with his diabetes management.

## 2015-01-10 ENCOUNTER — Other Ambulatory Visit: Payer: Self-pay | Admitting: *Deleted

## 2015-01-10 NOTE — Patient Outreach (Signed)
Spoke with Gary Lang and his wife Gary Lang to ensure they have the contact number for the [place] at 1-800- [phone] to assist with answering  their questions about his prescription benefits, etc.  Will discharge Gary Lang from the [facility] as he no longer qualifies for the program and its benefits since he has medical insurance with a Medicare PPO Plan. Barrington Ellison RN,CCM,CDE [place] Coordinator Office Phone [phone] Office Fax [phone]

## 2015-01-10 NOTE — Patient Outreach (Signed)
Returned call to [employer]' cell phone at wife's request in regards to questions they have about his medication coverage now that he is on the [place]. Message left advising Gary Lang to contact the [place] at 1-800- [phone]. Barrington Ellison RN,CCM,CDE [place] [facility] Coordinator Office Phone [phone] Office Fax [phone]

## 2015-09-05 MED FILL — ENALAPRIL MALEATE 10 MG TAB: 10 | 30 days supply | Qty: 30 | Fill #4

## 2015-09-05 MED FILL — FUROSEMIDE 40 MG TABLET: 40 | 30 days supply | Qty: 30 | Fill #4

## 2015-09-05 MED FILL — PANTOPRAZOLE SOD DR 20 MG T: 20 | 30 days supply | Qty: 30 | Fill #4

## 2015-09-05 MED FILL — DILTIAZEM 24HR ER 180 MG CA: 180 | 30 days supply | Qty: 30 | Fill #3

## 2015-09-05 MED FILL — ATORVASTATIN 40 MG TABLET: 40 | 30 days supply | Qty: 30 | Fill #5

## 2015-09-19 MED FILL — TRULICITY 0.75 MG/0.5 ML PE: 0.75 | 30 days supply | Qty: 2 | Fill #3

## 2015-09-28 MED FILL — UNIFINE PENTIPS 31GX3/16: 31G X 5 MM | 30 days supply | Qty: 100 | Fill #4

## 2015-09-28 MED FILL — FREESTYLE LITE TEST STRIP: 25 days supply | Qty: 100 | Fill #2

## 2015-09-29 MED FILL — NOVOLOG FLEXPEN SYRINGE: 100 | 30 days supply | Qty: 15 | Fill #0

## 2015-10-04 DIAGNOSIS — Z125 Encounter for screening for malignant neoplasm of prostate: Secondary | ICD-10-CM | POA: Diagnosis not present

## 2015-10-04 DIAGNOSIS — E119 Type 2 diabetes mellitus without complications: Secondary | ICD-10-CM | POA: Diagnosis not present

## 2015-10-05 MED FILL — ENALAPRIL MALEATE 10 MG TAB: 10 | 30 days supply | Qty: 30 | Fill #5

## 2015-10-05 MED FILL — predniSONE 20 MG TABS: 20 | 30 days supply | Qty: 30 | Fill #1

## 2015-10-05 MED FILL — VIT D2 1.25 MG (50,000 UNIT: 1.25 MG | 28 days supply | Qty: 4 | Fill #5

## 2015-10-05 MED FILL — FUROSEMIDE 40 MG TABLET: 40 | 30 days supply | Qty: 30 | Fill #5

## 2015-10-05 MED FILL — DILTIAZEM 24HR ER 180 MG CA: 180 | 30 days supply | Qty: 30 | Fill #4

## 2015-10-10 MED FILL — ATORVASTATIN 40 MG TABLET: 40 | 30 days supply | Qty: 30 | Fill #0

## 2015-10-10 MED FILL — PANTOPRAZOLE SOD DR 20 MG T: 20 | 30 days supply | Qty: 30 | Fill #0

## 2015-10-11 DIAGNOSIS — I10 Essential (primary) hypertension: Secondary | ICD-10-CM | POA: Diagnosis not present

## 2015-10-11 DIAGNOSIS — E119 Type 2 diabetes mellitus without complications: Secondary | ICD-10-CM | POA: Diagnosis not present

## 2015-10-11 MED FILL — TRULICITY 1.5 MG/0.5 ML PEN: 1.5 | 30 days supply | Qty: 2 | Fill #0

## 2015-10-11 MED FILL — LANTUS SOLOSTAR 100 UNITS/M: 100 | 38 days supply | Qty: 15 | Fill #0

## 2015-10-27 MED FILL — UNIFINE PENTIPS 31GX3/16: 31G X 5 MM | 30 days supply | Qty: 100 | Fill #5

## 2015-11-07 MED FILL — FREESTYLE LITE TEST STRIP: 25 days supply | Qty: 100 | Fill #3

## 2015-11-07 MED FILL — ENALAPRIL MALEATE 10 MG TAB: 10 | 30 days supply | Qty: 30 | Fill #6

## 2015-11-07 MED FILL — VIT D2 1.25 MG (50,000 UNIT: 1.25 MG | 28 days supply | Qty: 4 | Fill #0

## 2015-11-07 MED FILL — ATORVASTATIN 40 MG TABLET: 40 | 30 days supply | Qty: 30 | Fill #1

## 2015-11-07 MED FILL — DILTIAZEM 24HR ER 180 MG CA: 180 | 30 days supply | Qty: 30 | Fill #5

## 2015-11-07 MED FILL — PANTOPRAZOLE SOD DR 20 MG T: 20 | 30 days supply | Qty: 30 | Fill #1

## 2015-11-07 MED FILL — FUROSEMIDE 40 MG TABLET: 40 | 30 days supply | Qty: 30 | Fill #0

## 2015-11-10 MED FILL — TRULICITY 1.5 MG/0.5 ML PEN: 1.5 | 30 days supply | Qty: 2 | Fill #1

## 2015-11-13 MED FILL — NOVOLOG FLEXPEN SYRINGE: 100 | 30 days supply | Qty: 15 | Fill #1

## 2015-12-01 MED FILL — UNIFINE PENTIPS 8MM 31G: 31G X 8 MM | 30 days supply | Qty: 100 | Fill #0

## 2015-12-04 DIAGNOSIS — E669 Obesity, unspecified: Secondary | ICD-10-CM | POA: Diagnosis not present

## 2015-12-04 DIAGNOSIS — N183 Chronic kidney disease, stage 3 (moderate): Secondary | ICD-10-CM | POA: Diagnosis not present

## 2015-12-04 DIAGNOSIS — N179 Acute kidney failure, unspecified: Secondary | ICD-10-CM | POA: Diagnosis not present

## 2015-12-04 DIAGNOSIS — I10 Essential (primary) hypertension: Secondary | ICD-10-CM | POA: Diagnosis not present

## 2015-12-04 DIAGNOSIS — I770 Arteriovenous fistula, acquired: Secondary | ICD-10-CM | POA: Diagnosis not present

## 2015-12-08 MED FILL — VIT D2 1.25 MG (50,000 UNIT: 1.25 MG | 28 days supply | Qty: 4 | Fill #1

## 2015-12-08 MED FILL — FUROSEMIDE 40 MG TABLET: 40 | 30 days supply | Qty: 30 | Fill #1

## 2015-12-08 MED FILL — CARTIA XT 180 MG CAPSULE SA: 180 | 30 days supply | Qty: 30 | Fill #0

## 2015-12-08 MED FILL — ENALAPRIL MALEATE 10 MG TAB: 10 | 30 days supply | Qty: 30 | Fill #0

## 2015-12-08 MED FILL — ATORVASTATIN 40 MG TABLET: 40 | 30 days supply | Qty: 30 | Fill #2

## 2015-12-08 MED FILL — PANTOPRAZOLE SOD DR 20 MG T: 20 | 30 days supply | Qty: 30 | Fill #2

## 2015-12-08 MED FILL — LANTUS SOLOSTAR 100 UNITS/M: 100 | 38 days supply | Qty: 15 | Fill #1

## 2015-12-08 MED FILL — NOVOLOG FLEXPEN SYRINGE: 100 | 30 days supply | Qty: 15 | Fill #2

## 2015-12-12 MED FILL — FREESTYLE LITE TEST STRIP: 25 days supply | Qty: 100 | Fill #4

## 2015-12-29 MED FILL — UNIFINE PENTIPS 8MM 31G: 31G X 8 MM | 33 days supply | Qty: 100 | Fill #0

## 2016-01-02 MED FILL — predniSONE 20 MG TABS: 20 | 30 days supply | Qty: 30 | Fill #2

## 2016-01-02 MED FILL — VIT D2 1.25 MG (50,000 UNIT: 1.25 MG | 28 days supply | Qty: 4 | Fill #2

## 2016-01-02 MED FILL — PANTOPRAZOLE SOD DR 20 MG T: 20 | 30 days supply | Qty: 30 | Fill #3

## 2016-01-02 MED FILL — FUROSEMIDE 40 MG TABLET: 40 | 30 days supply | Qty: 30 | Fill #2

## 2016-01-02 MED FILL — CARTIA XT 180 MG CAPSULE SA: 180 | 30 days supply | Qty: 30 | Fill #1

## 2016-01-02 MED FILL — NOVOLOG FLEXPEN SYRINGE: 100 | 30 days supply | Qty: 15 | Fill #3

## 2016-01-02 MED FILL — ATORVASTATIN 40 MG TABLET: 40 | 30 days supply | Qty: 30 | Fill #3

## 2016-01-02 MED FILL — FREESTYLE LITE TEST STRIP: 25 days supply | Qty: 100 | Fill #5

## 2016-01-02 MED FILL — ENALAPRIL MALEATE 10 MG TAB: 10 | 30 days supply | Qty: 30 | Fill #1

## 2016-01-12 DIAGNOSIS — E1122 Type 2 diabetes mellitus with diabetic chronic kidney disease: Secondary | ICD-10-CM | POA: Diagnosis not present

## 2016-01-12 DIAGNOSIS — E782 Mixed hyperlipidemia: Secondary | ICD-10-CM | POA: Diagnosis not present

## 2016-01-17 DIAGNOSIS — I10 Essential (primary) hypertension: Secondary | ICD-10-CM | POA: Diagnosis not present

## 2016-01-17 DIAGNOSIS — E782 Mixed hyperlipidemia: Secondary | ICD-10-CM | POA: Diagnosis not present

## 2016-01-17 DIAGNOSIS — M059 Rheumatoid arthritis with rheumatoid factor, unspecified: Secondary | ICD-10-CM | POA: Diagnosis not present

## 2016-01-17 DIAGNOSIS — E1122 Type 2 diabetes mellitus with diabetic chronic kidney disease: Secondary | ICD-10-CM | POA: Diagnosis not present

## 2016-01-17 MED FILL — TRULICITY 1.5 MG/0.5 ML PEN: 1.5 | 28 days supply | Qty: 2 | Fill #0

## 2016-01-17 MED FILL — LANTUS SOLOSTAR 100 UNITS/M: 100 | 27 days supply | Qty: 15 | Fill #0

## 2016-02-01 MED FILL — VIT D2 1.25 MG (50,000 UNIT: 1.25 MG | 28 days supply | Qty: 4 | Fill #3

## 2016-02-01 MED FILL — UNIFINE PENTIPS 8MM 31G: 31G X 8 MM | 33 days supply | Qty: 100 | Fill #1

## 2016-02-13 MED FILL — NOVOLOG FLEXPEN SYRINGE: 100 | 30 days supply | Qty: 15 | Fill #4

## 2016-02-13 MED FILL — CARTIA XT 180 MG CAPSULE SA: 180 | 30 days supply | Qty: 30 | Fill #2

## 2016-02-19 MED FILL — PANTOPRAZOLE SOD DR 20 MG T: 20 | 30 days supply | Qty: 30 | Fill #4

## 2016-02-19 MED FILL — ENALAPRIL MALEATE 10 MG TAB: 10 | 30 days supply | Qty: 30 | Fill #2

## 2016-02-19 MED FILL — ATORVASTATIN 40 MG TABLET: 40 | 30 days supply | Qty: 30 | Fill #4

## 2016-02-23 MED FILL — LANTUS SOLOSTAR 100 UNITS/M: 100 | 27 days supply | Qty: 15 | Fill #1

## 2016-02-28 MED FILL — FREESTYLE LANCETS: 25 days supply | Qty: 100 | Fill #0

## 2016-02-28 MED FILL — FREESTYLE LITE TEST STRIP: 25 days supply | Qty: 100 | Fill #0

## 2016-03-12 DIAGNOSIS — H5203 Hypermetropia, bilateral: Secondary | ICD-10-CM | POA: Diagnosis not present

## 2016-03-12 DIAGNOSIS — H524 Presbyopia: Secondary | ICD-10-CM | POA: Diagnosis not present

## 2016-03-12 DIAGNOSIS — H52221 Regular astigmatism, right eye: Secondary | ICD-10-CM | POA: Diagnosis not present

## 2016-03-12 DIAGNOSIS — E109 Type 1 diabetes mellitus without complications: Secondary | ICD-10-CM | POA: Diagnosis not present

## 2016-03-20 MED FILL — CARTIA XT 180 MG CAPSULE SA: 180 | 30 days supply | Qty: 30 | Fill #3

## 2016-03-20 MED FILL — predniSONE 20 MG TABS: 20 | 30 days supply | Qty: 30 | Fill #0

## 2016-03-20 MED FILL — ENALAPRIL MALEATE 10 MG TAB: 10 | 30 days supply | Qty: 30 | Fill #3

## 2016-03-20 MED FILL — ATORVASTATIN 40 MG TABLET: 40 | 30 days supply | Qty: 30 | Fill #0

## 2016-04-01 MED FILL — UNIFINE PENTIPS 8MM 31G: 31G X 8 MM | 33 days supply | Qty: 100 | Fill #2

## 2016-04-01 MED FILL — PANTOPRAZOLE SOD DR 20 MG T: 20 | 30 days supply | Qty: 30 | Fill #0

## 2016-04-01 MED FILL — NOVOLOG FLEXPEN SYRINGE: 100 | 30 days supply | Qty: 15 | Fill #5

## 2016-04-01 MED FILL — LANTUS SOLOSTAR 100 UNITS/M: 100 | 27 days supply | Qty: 15 | Fill #2

## 2016-04-02 DIAGNOSIS — E782 Mixed hyperlipidemia: Secondary | ICD-10-CM | POA: Diagnosis not present

## 2016-04-02 DIAGNOSIS — E1122 Type 2 diabetes mellitus with diabetic chronic kidney disease: Secondary | ICD-10-CM | POA: Diagnosis not present

## 2016-04-05 DIAGNOSIS — M059 Rheumatoid arthritis with rheumatoid factor, unspecified: Secondary | ICD-10-CM | POA: Diagnosis not present

## 2016-04-05 DIAGNOSIS — I10 Essential (primary) hypertension: Secondary | ICD-10-CM | POA: Diagnosis not present

## 2016-04-05 DIAGNOSIS — N183 Chronic kidney disease, stage 3 (moderate): Secondary | ICD-10-CM | POA: Diagnosis not present

## 2016-04-05 DIAGNOSIS — E1142 Type 2 diabetes mellitus with diabetic polyneuropathy: Secondary | ICD-10-CM | POA: Diagnosis not present

## 2016-04-05 MED FILL — TRULICITY 1.5 MG/0.5 ML PEN: 1.5 | 28 days supply | Qty: 2 | Fill #1

## 2016-04-19 MED FILL — ATORVASTATIN 40 MG TABLET: 40 | 30 days supply | Qty: 30 | Fill #1

## 2016-04-19 MED FILL — CARTIA XT 180 MG CAPSULE SA: 180 | 30 days supply | Qty: 30 | Fill #4

## 2016-04-19 MED FILL — ENALAPRIL MALEATE 10 MG TAB: 10 | 30 days supply | Qty: 30 | Fill #4

## 2016-04-24 MED FILL — FREESTYLE LITE TEST STRIP: 25 days supply | Qty: 100 | Fill #1

## 2016-05-03 MED FILL — LANTUS SOLOSTAR 100 UNITS/M: 100 | 27 days supply | Qty: 15 | Fill #3

## 2016-05-03 MED FILL — PANTOPRAZOLE SOD DR 20 MG T: 20 | 30 days supply | Qty: 30 | Fill #1

## 2016-05-03 MED FILL — NOVOLOG FLEXPEN SYRINGE: 100 | 30 days supply | Qty: 15 | Fill #6

## 2016-05-03 MED FILL — TRULICITY 1.5 MG/0.5 ML PEN: 1.5 | 28 days supply | Qty: 2 | Fill #2

## 2016-05-22 DIAGNOSIS — E782 Mixed hyperlipidemia: Secondary | ICD-10-CM | POA: Diagnosis not present

## 2016-05-22 DIAGNOSIS — N183 Chronic kidney disease, stage 3 (moderate): Secondary | ICD-10-CM | POA: Diagnosis not present

## 2016-05-22 DIAGNOSIS — I10 Essential (primary) hypertension: Secondary | ICD-10-CM | POA: Diagnosis not present

## 2016-05-22 DIAGNOSIS — E1122 Type 2 diabetes mellitus with diabetic chronic kidney disease: Secondary | ICD-10-CM | POA: Diagnosis not present

## 2016-05-22 DIAGNOSIS — M059 Rheumatoid arthritis with rheumatoid factor, unspecified: Secondary | ICD-10-CM | POA: Diagnosis not present

## 2016-05-22 MED FILL — CARTIA XT 180 MG CAPSULE SA: 180 | 30 days supply | Qty: 30 | Fill #5

## 2016-05-22 MED FILL — ENALAPRIL MALEATE 10 MG TAB: 10 | 30 days supply | Qty: 30 | Fill #5

## 2016-05-22 MED FILL — ATORVASTATIN 40 MG TABLET: 40 | 30 days supply | Qty: 30 | Fill #2

## 2016-05-28 DIAGNOSIS — Z23 Encounter for immunization: Secondary | ICD-10-CM | POA: Diagnosis not present

## 2016-06-04 DIAGNOSIS — I10 Essential (primary) hypertension: Secondary | ICD-10-CM | POA: Diagnosis not present

## 2016-06-04 DIAGNOSIS — N179 Acute kidney failure, unspecified: Secondary | ICD-10-CM | POA: Diagnosis not present

## 2016-06-04 DIAGNOSIS — I770 Arteriovenous fistula, acquired: Secondary | ICD-10-CM | POA: Diagnosis not present

## 2016-06-04 DIAGNOSIS — N183 Chronic kidney disease, stage 3 (moderate): Secondary | ICD-10-CM | POA: Diagnosis not present

## 2016-06-04 DIAGNOSIS — E669 Obesity, unspecified: Secondary | ICD-10-CM | POA: Diagnosis not present

## 2016-06-04 DIAGNOSIS — L309 Dermatitis, unspecified: Secondary | ICD-10-CM | POA: Diagnosis not present

## 2016-06-04 MED FILL — predniSONE 20 MG TABS: 20 | 30 days supply | Qty: 30 | Fill #1

## 2016-06-04 MED FILL — TRIAMCINOLONE 0.1% OINTMENT: 0.1 | 10 days supply | Qty: 15 | Fill #0

## 2016-06-04 MED FILL — PANTOPRAZOLE SOD DR 20 MG T: 20 | 30 days supply | Qty: 30 | Fill #2

## 2016-06-11 MED FILL — LANTUS SOLOSTAR 100 UNITS/M: 100 | 27 days supply | Qty: 15 | Fill #4

## 2016-06-11 MED FILL — NOVOLOG FLEXPEN SYRINGE: 100 | 30 days supply | Qty: 15 | Fill #7

## 2016-06-11 MED FILL — TRULICITY 1.5 MG/0.5 ML PEN: 1.5 | 28 days supply | Qty: 2 | Fill #3

## 2016-06-11 MED FILL — FREESTYLE LITE TEST STRIP: 25 days supply | Qty: 100 | Fill #2

## 2016-06-19 MED FILL — ATORVASTATIN 40 MG TABLET: 40 | 30 days supply | Qty: 30 | Fill #3

## 2016-06-19 MED FILL — CARTIA XT 180 MG CAPSULE SA: 180 | 30 days supply | Qty: 30 | Fill #0

## 2016-07-01 MED FILL — TRIAMCINOLONE 0.1% OINTMENT: 0.1 | 7 days supply | Qty: 15 | Fill #0

## 2016-07-03 MED FILL — PANTOPRAZOLE SOD DR 20 MG T: 20 | 30 days supply | Qty: 30 | Fill #3

## 2016-07-08 DIAGNOSIS — I10 Essential (primary) hypertension: Secondary | ICD-10-CM | POA: Diagnosis not present

## 2016-07-08 DIAGNOSIS — E1122 Type 2 diabetes mellitus with diabetic chronic kidney disease: Secondary | ICD-10-CM | POA: Diagnosis not present

## 2016-07-08 DIAGNOSIS — E782 Mixed hyperlipidemia: Secondary | ICD-10-CM | POA: Diagnosis not present

## 2016-07-10 DIAGNOSIS — D631 Anemia in chronic kidney disease: Secondary | ICD-10-CM | POA: Diagnosis not present

## 2016-07-10 DIAGNOSIS — E785 Hyperlipidemia, unspecified: Secondary | ICD-10-CM | POA: Diagnosis not present

## 2016-07-10 DIAGNOSIS — I10 Essential (primary) hypertension: Secondary | ICD-10-CM | POA: Diagnosis not present

## 2016-07-10 DIAGNOSIS — N183 Chronic kidney disease, stage 3 (moderate): Secondary | ICD-10-CM | POA: Diagnosis not present

## 2016-07-10 DIAGNOSIS — Z0001 Encounter for general adult medical examination with abnormal findings: Secondary | ICD-10-CM | POA: Diagnosis not present

## 2016-07-10 DIAGNOSIS — Z6830 Body mass index (BMI) 30.0-30.9, adult: Secondary | ICD-10-CM | POA: Diagnosis not present

## 2016-07-10 DIAGNOSIS — E1122 Type 2 diabetes mellitus with diabetic chronic kidney disease: Secondary | ICD-10-CM | POA: Diagnosis not present

## 2016-07-10 DIAGNOSIS — M059 Rheumatoid arthritis with rheumatoid factor, unspecified: Secondary | ICD-10-CM | POA: Diagnosis not present

## 2016-07-17 MED FILL — TRULICITY 1.5 MG/0.5 ML PEN: 1.5 | 28 days supply | Qty: 2 | Fill #4

## 2016-07-17 MED FILL — FREESTYLE LITE TEST STRIP: 25 days supply | Qty: 100 | Fill #3

## 2016-07-17 MED FILL — ATORVASTATIN 40 MG TABLET: 40 | 30 days supply | Qty: 30 | Fill #4

## 2016-07-17 MED FILL — NOVOLOG FLEXPEN SYRINGE: 100 | 30 days supply | Qty: 15 | Fill #8

## 2016-07-17 MED FILL — DILTIAZEM 24HR ER 180 MG CA: 180 | 30 days supply | Qty: 30 | Fill #1

## 2016-07-17 MED FILL — LANTUS SOLOSTAR 100 UNITS/M: 100 | 27 days supply | Qty: 15 | Fill #5

## 2016-08-06 MED FILL — UNIFINE PENTIPS 8MM 31G: 31G X 8 MM | 33 days supply | Qty: 100 | Fill #3

## 2016-08-06 MED FILL — PANTOPRAZOLE SOD DR 20 MG T: 20 | 30 days supply | Qty: 30 | Fill #4

## 2016-08-13 MED FILL — TRULICITY 1.5 MG/0.5 ML PEN: 1.5 | 28 days supply | Qty: 2 | Fill #5

## 2016-08-13 MED FILL — DILTIAZEM 24HR ER 180 MG CA: 180 | 30 days supply | Qty: 30 | Fill #2

## 2016-08-13 MED FILL — NOVOLOG FLEXPEN SYRINGE: 100 | 30 days supply | Qty: 15 | Fill #9

## 2016-08-13 MED FILL — ATORVASTATIN 40 MG TABLET: 40 | 30 days supply | Qty: 30 | Fill #5

## 2016-08-13 MED FILL — LANTUS SOLOSTAR 100 UNITS/M: 100 | 27 days supply | Qty: 15 | Fill #0

## 2016-08-13 MED FILL — FREESTYLE LITE TEST STRIP: 25 days supply | Qty: 100 | Fill #4

## 2016-08-21 MED FILL — predniSONE 20 MG TABS: 20 | 30 days supply | Qty: 30 | Fill #2

## 2016-09-02 ENCOUNTER — Emergency Department (HOSPITAL_COMMUNITY)
Admission: EM | Admit: 2016-09-02 | Discharge: 2016-09-03 | Disposition: A | Discharge status: Home / Self Care | Payer: PPO | Source: Home / Self Care | Attending: Emergency Medicine | Admitting: Emergency Medicine

## 2016-09-02 ENCOUNTER — Emergency Department (HOSPITAL_COMMUNITY): Payer: PPO

## 2016-09-02 ENCOUNTER — Encounter (HOSPITAL_COMMUNITY): Payer: Self-pay | Admitting: *Deleted

## 2016-09-02 DIAGNOSIS — R1084 Generalized abdominal pain: Secondary | ICD-10-CM | POA: Diagnosis not present

## 2016-09-02 DIAGNOSIS — Z79899 Other long term (current) drug therapy: Secondary | ICD-10-CM

## 2016-09-02 DIAGNOSIS — R197 Diarrhea, unspecified: Secondary | ICD-10-CM

## 2016-09-02 DIAGNOSIS — Z7982 Long term (current) use of aspirin: Secondary | ICD-10-CM | POA: Insufficient documentation

## 2016-09-02 DIAGNOSIS — N186 End stage renal disease: Secondary | ICD-10-CM

## 2016-09-02 DIAGNOSIS — E1122 Type 2 diabetes mellitus with diabetic chronic kidney disease: Secondary | ICD-10-CM

## 2016-09-02 DIAGNOSIS — R112 Nausea with vomiting, unspecified: Secondary | ICD-10-CM | POA: Diagnosis not present

## 2016-09-02 DIAGNOSIS — K5730 Diverticulosis of large intestine without perforation or abscess without bleeding: Secondary | ICD-10-CM | POA: Diagnosis not present

## 2016-09-02 DIAGNOSIS — Z794 Long term (current) use of insulin: Secondary | ICD-10-CM | POA: Insufficient documentation

## 2016-09-02 DIAGNOSIS — I120 Hypertensive chronic kidney disease with stage 5 chronic kidney disease or end stage renal disease: Secondary | ICD-10-CM | POA: Insufficient documentation

## 2016-09-02 LAB — COMPREHENSIVE METABOLIC PANEL
ALT: 17 U/L (ref 17–63)
AST: 16 U/L (ref 15–41)
Albumin: 3.6 g/dL (ref 3.5–5.0)
Alkaline Phosphatase: 81 U/L (ref 38–126)
Anion gap: 10 (ref 5–15)
BUN: 16 mg/dL (ref 6–20)
CHLORIDE: 102 mmol/L (ref 101–111)
CO2: 26 mmol/L (ref 22–32)
CREATININE: 1.25 mg/dL — AB (ref 0.61–1.24)
Calcium: 8.8 mg/dL — ABNORMAL LOW (ref 8.9–10.3)
GFR calc Af Amer: >60 mL/min (ref >60)
GFR, EST NON AFRICAN AMERICAN: 58 mL/min — AB (ref >60)
Glucose, Bld: 194 mg/dL — ABNORMAL HIGH (ref 65–99)
Potassium: 3.4 mmol/L — ABNORMAL LOW (ref 3.5–5.1)
Sodium: 138 mmol/L (ref 135–145)
TOTAL PROTEIN: 7.5 g/dL (ref 6.5–8.1)
Total Bilirubin: 0.3 mg/dL (ref 0.3–1.2)

## 2016-09-02 LAB — CBC WITH DIFFERENTIAL/PLATELET
BASOS ABS: 0 10*3/uL (ref 0.0–0.1)
Basophils Relative: 0 %
Eosinophils Absolute: 0 10*3/uL (ref 0.0–0.7)
Eosinophils Relative: 0 %
HEMATOCRIT: 39.7 % (ref 39.0–52.0)
Hemoglobin: 13 g/dL (ref 13.0–17.0)
Lymphocytes Relative: 10 %
Lymphs Abs: 1.3 10*3/uL (ref 0.7–4.0)
MCH: 27.2 pg (ref 26.0–34.0)
MCHC: 32.7 g/dL (ref 30.0–36.0)
MCV: 83.1 fL (ref 78.0–100.0)
Monocytes Absolute: 0.3 10*3/uL (ref 0.1–1.0)
Monocytes Relative: 3 %
NEUTROS ABS: 11.2 10*3/uL — AB (ref 1.7–7.7)
Neutrophils Relative %: 87 %
Platelets: 220 10*3/uL (ref 150–400)
RBC: 4.78 MIL/uL (ref 4.22–5.81)
RDW: 14 % (ref 11.5–15.5)
WBC: 12.9 10*3/uL — ABNORMAL HIGH (ref 4.0–10.5)

## 2016-09-02 LAB — LIPASE, BLOOD: LIPASE: 35 U/L (ref 11–51)

## 2016-09-02 LAB — LACTIC ACID, PLASMA: Lactic Acid, Venous: 1.3 mmol/L (ref 0.5–1.9)

## 2016-09-02 MED ORDER — MORPHINE SULFATE (PF) 4 MG/ML IV SOLN
4.0000 mg | Freq: Once | INTRAVENOUS | Status: AC
  Administered 2016-09-02: 4 mg via INTRAVENOUS
  Filled 2016-09-02: qty 1

## 2016-09-02 MED ORDER — ONDANSETRON HCL 4 MG/2ML IJ SOLN
4.0000 mg | Freq: Once | INTRAMUSCULAR | Status: AC
  Administered 2016-09-02: 4 mg via INTRAVENOUS
  Filled 2016-09-02: qty 2

## 2016-09-02 MED ORDER — SODIUM CHLORIDE 0.9 % IV BOLUS (SEPSIS)
1000.0000 mL | Freq: Once | INTRAVENOUS | Status: AC
  Administered 2016-09-02: 1000 mL via INTRAVENOUS

## 2016-09-02 NOTE — ED Provider Notes (Signed)
[facility] DEPT Provider Note   CSN: [id number] Arrival date & time: 09/02/16  2146   By signing my name below, I, Eunice Blase, attest that this documentation has been prepared under the direction and in the presence of Merryl Hacker, MD. Electronically signed, Eunice Blase, ED Scribe. 09/02/16. 5:00 AM.   History   Chief Complaint Chief Complaint  Patient presents with  . Abdominal Pain   HPI  HPI Comments: Gary Lang is a 67 y.o. male who presents to the Emergency Department complaining of severe, 9/10 abdominal pain x ~7-8 hours. Pain is described as cramping. Pt reports associated vomiting x 2-3 hours and belching. He states his last BM was ~6 :00 PM this evening. Previously was on dialysis but no longer requires this.. He notes undisclosed medications he at home regularly. Pt denies diarrhea, urinary symptoms, Hx of similar sx's, back pain, constipation and Hx of gallbladder/appendix removal. No sick contacts at home.   Past Medical History:  Diagnosis Date  . Chronic kidney disease   . Diabetes mellitus without complication ([place])   . Hypertension   . Rheumatoid arthritis ([place])   . Vitamin D deficiency    Hx: of    Patient Active Problem List   Diagnosis Date Noted  . Erectile dysfunction 02/01/2014  . Rash and nonspecific skin eruption 11/04/2013  . Esophageal reflux 11/04/2013  . End stage renal disease ([place]) 09/23/2013  . AVF (arteriovenous fistula) ([place]) 08/17/2013  . Glomerulonephritis, focal sclerosing 07/06/2013  . Diabetes mellitus ([place]) 02/07/2012  . Hypertension 09/02/2008  . Rheumatoid arthritis(714.0) 09/02/1996    Past Surgical History:  Procedure Laterality Date  . AV FISTULA PLACEMENT Left 06/28/2013   Procedure: ARTERIOVENOUS (AV) FISTULA CREATION- Left arm with ultrasound guidance;  Surgeon: Rosetta Posner, MD;  Location: [facility] OR;  Service: Vascular;  Laterality: Left;  . COLONOSCOPY     hx: of  . HERNIA REPAIR    . INSERTION OF  DIALYSIS CATHETER N/A 06/28/2013   Procedure: INSERTION OF DIALYSIS CATHETER;  Surgeon: Rosetta Posner, MD;  Location: [facility] OR;  Service: Vascular;  Laterality: N/A;  . LIGATION OF COMPETING BRANCHES OF ARTERIOVENOUS FISTULA Left 10/12/2013   Procedure: LIGATION OF COMPETING BRANCHES OF ARTERIOVENOUS FISTULA;  Surgeon: Elam Dutch, MD;  Location: [place];  Service: Vascular;  Laterality: Left;  . neck surgery         Home Medications    Prior to Admission medications   Medication Sig Start Date End Date Taking? Authorizing Provider  acetaminophen (TYLENOL) 500 MG tablet Take 2 tablets (1,000 mg total) by mouth 3 (three) times daily as needed for pain. 02/07/12  Yes Zenia Resides, MD  aspirin 81 MG EC tablet Take 1 tablet (81 mg total) by mouth daily. Swallow whole. 02/07/12  Yes Zenia Resides, MD  atorvastatin (LIPITOR) 40 MG tablet Take 1 tablet by mouth daily. 08/13/16  Yes Historical Provider, MD  cholecalciferol (VITAMIN D) 1000 units tablet Take 1,000 Units by mouth daily.   Yes Historical Provider, MD  cloNIDine (CATAPRES) 0.2 MG tablet Take 0.2 mg by mouth daily.   Yes Historical Provider, MD  furosemide (LASIX) 80 MG tablet Take 80 mg by mouth See admin instructions. Tuesday, Thursday and Saturday.   Yes Historical Provider, MD  insulin aspart (NOVOLOG FLEXPEN) 100 UNIT/ML FlexPen Inject 7.5 Units into the skin 3 (three) times daily with meals. Patient taking differently: Inject 18 Units into the skin 3 (three) times daily with meals.  01/31/14  Yes Waldemar Dickens, MD  Insulin Glargine (LANTUS) 100 UNIT/ML Solostar Pen Inject 20 Units into the skin every morning. Increase 5 u if am CBG is greater than 200.  Decrease 5 u if am CBG is less than 150 Patient taking differently: Inject 34 Units into the skin every morning. Increase 5 u if am CBG is greater than 200.  Decrease 5 u if am CBG is less than 150 09/05/14  Yes Aquilla Hacker, MD  Insulin Pen Needle (PEN NEEDLES) 31G X 6 MM MISC  Use 1 needle per injection as directed 07/01/13  Yes Patrecia Pour, MD  UNIFINE PENTIPS 31G X 5 MM MISC USE AS DIRECTED WITH INSULIN   Yes Waldemar Dickens, MD  dicyclomine (BENTYL) 20 MG tablet Take 1 tablet (20 mg total) by mouth 2 (two) times daily as needed for spasms. 09/03/16   Merryl Hacker, MD  diltiazem (DILACOR XR) 180 MG 24 hr capsule Take 1 capsule (180 mg total) by mouth daily. 08/05/13   Waldemar Dickens, MD  loperamide (IMODIUM) 2 MG capsule Take 1 capsule (2 mg total) by mouth 4 (four) times daily as needed for diarrhea or loose stools. 09/03/16   Merryl Hacker, MD  ondansetron (ZOFRAN ODT) 4 MG disintegrating tablet Take 1 tablet (4 mg total) by mouth every 8 (eight) hours as needed for nausea or vomiting. 09/03/16   Merryl Hacker, MD  sildenafil (VIAGRA) 50 MG tablet Take 0.5-1 tablets (25-50 mg total) by mouth daily as needed for erectile dysfunction. 02/01/14   Waldemar Dickens, MD  TRULICITY 1.5 OI/3.2PQ SOPN Inject 1 Units as directed once a week. Patient isn't sure how many units he takes 08/13/16   Historical Provider, MD    Family History Family History  Problem Relation Age of Onset  . Cancer Mother   . Arthritis/Rheumatoid Sister     Social History Social History  Substance Use Topics  . Smoking status: Former Smoker    Packs/day: 0.50    Years: 10.00    Types: Cigarettes    Quit date: 09/02/1976  . Smokeless tobacco: Never Used  . Alcohol use No     Allergies   Glyburide and Methotrexate derivatives   Review of Systems Review of Systems  Constitutional: Negative for fever.  Respiratory: Negative for shortness of breath.   Cardiovascular: Negative for chest pain.  Gastrointestinal: Positive for abdominal pain and vomiting. Negative for constipation and diarrhea.  Genitourinary: Negative.   All other systems reviewed and are negative.    Physical Exam Updated Vital Signs BP 190/76 (BP Location: Right Arm)   Pulse 61   Temp 98.3 F (36.8 C)  (Oral)   Resp 22   Ht '6\' 1"'$  (1.854 m)   Wt 225 lb (102.1 kg)   SpO2 100%   BMI 29.69 kg/m   Physical Exam  Constitutional: He is oriented to person, place, and time. No distress.  HENT:  Head: Normocephalic and atraumatic.  Eyes: Pupils are equal, round, and reactive to light.  Cardiovascular: Normal rate, regular rhythm and normal heart sounds.   No murmur heard. Pulmonary/Chest: Effort normal and breath sounds normal. No respiratory distress. He has no wheezes.  Abdominal: Soft. There is tenderness. There is no rebound.  Diffuse tenderness to palpation without rebound or guarding, hyperactive bowel sounds.  Musculoskeletal: He exhibits no edema.  Neurological: He is alert and oriented to person, place, and time.  Skin: Skin is warm and dry.  Psychiatric: He has a normal mood and affect.  Nursing note and vitals reviewed.    ED Treatments / Results  DIAGNOSTIC STUDIES: Oxygen Saturation is 100% on RA, normal by my interpretation.    COORDINATION OF CARE: 5:00 AM Discussed treatment plan with pt at bedside and pt agreed to plan.  Labs (all labs ordered are listed, but only abnormal results are displayed) Labs Reviewed  CBC WITH DIFFERENTIAL/PLATELET - Abnormal; Notable for the following:       Result Value   WBC 12.9 (*)    Neutro Abs 11.2 (*)    All other components within normal limits  COMPREHENSIVE METABOLIC PANEL - Abnormal; Notable for the following:    Potassium 3.4 (*)    Glucose, Bld 194 (*)    Creatinine, Ser 1.25 (*)    Calcium 8.8 (*)    GFR calc non Af Amer 58 (*)    All other components within normal limits  URINALYSIS, ROUTINE W REFLEX MICROSCOPIC - Abnormal; Notable for the following:    Color, Urine STRAW (*)    Glucose, UA >=500 (*)    Ketones, ur 20 (*)    Protein, ur 100 (*)    All other components within normal limits  URINE CULTURE  LIPASE, BLOOD  LACTIC ACID, PLASMA    EKG  EKG Interpretation None       Radiology Ct Abdomen  Pelvis Wo Contrast  Result Date: 09/03/2016 CLINICAL DATA:  Severe abdominal pain for 8 hours, cramping. Vomiting. History of hypertension, diabetes, cholecystectomy and appendectomy. EXAM: CT ABDOMEN AND PELVIS WITHOUT CONTRAST TECHNIQUE: Multidetector CT imaging of the abdomen and pelvis was performed following the standard protocol without IV contrast. Oral contrast administered. COMPARISON:  Abdominal radiographs September 02, 2016 and CT abdomen and pelvis May 21, 2011 FINDINGS: LOWER CHEST: Dependent atelectasis. The visualized heart size is normal. No pericardial effusion. Contrast in the distal esophagus may be retained or refluxed. HEPATOBILIARY: Normal. PANCREAS: Normal. SPLEEN: Normal. ADRENALS/URINARY TRACT: Kidneys are orthotopic, demonstrating normal size and morphology. No nephrolithiasis, hydronephrosis; limited assessment for renal masses on this nonenhanced examination. 4.2 cm exophytic LEFT upper pole renal cyst. The unopacified ureters are normal in course and caliber. Urinary bladder is partially distended and unremarkable. Normal adrenal glands. STOMACH/BOWEL: The stomach, small and large bowel are normal in course and caliber without inflammatory changes. Proximal duodenum diverticulum. Moderate colonic diverticulosis. Scattered small bowel diverticula noted in the RIGHT abdomen. Normal appendix. VASCULAR/LYMPHATIC: Aortoiliac vessels are normal in course and caliber, trace calcific atherosclerosis. No lymphadenopathy by CT size criteria. REPRODUCTIVE: Mild prostatomegaly. OTHER: No intraperitoneal free fluid or free air. MUSCULOSKELETAL: Non-acute. Large fat containing LEFT inguinal hernia. Status post RIGHT inguinal herniorrhaphy. Small fat containing umbilical hernia. Degenerative change of lumbar spine resulting in moderate L5-S1 neural foraminal narrowing. IMPRESSION: No acute intra-abdominal or pelvic process. Small large bowel diverticulosis. Electronically Signed   By: Elon Alas M.D.   On: 09/03/2016 02:29   Dg Abdomen 1 View  Result Date: 09/02/2016 CLINICAL DATA:  Nausea and vomiting and generalized abdominal pain and cramping starting this evening. EXAM: ABDOMEN - 1 VIEW COMPARISON:  CT from 05/21/2011 FINDINGS: Moderate colonic stool burden within large bowel. No bowel obstruction or free air. No organomegaly. Phleboliths are seen in the pelvis. L4-5 and L5-S1 facet arthropathy. IMPRESSION: Moderate colonic stool burden.  No bowel obstruction. Electronically Signed   By: Ashley Royalty M.D.   On: 09/02/2016 23:47    Procedures Procedures (including critical care time)  Medications Ordered in ED Medications  sodium chloride 0.9 % bolus 1,000 mL (0 mLs Intravenous Stopped 09/03/16 0147)  morphine 4 MG/ML injection 4 mg (4 mg Intravenous Given 09/02/16 2325)  ondansetron (ZOFRAN) injection 4 mg (4 mg Intravenous Given 09/02/16 2325)  HYDROmorphone (DILAUDID) injection 1 mg (1 mg Intravenous Given 09/03/16 0150)     Initial Impression / Assessment and Plan / ED Course  I have reviewed the triage vital signs and the nursing notes.  Pertinent labs & imaging results that were available during my care of the patient were reviewed by me and considered in my medical decision making (see chart for details).  Clinical Course as of Sep 03 498  Tue Sep 03, 2016  0140 Pain improved with morphine but now returned.  Redosed pain medication.  Patient to get CT.  [CH]  0302 Resting comfortably after redosed pain medication.  CT neg.  Will PO challenge.   [CH]    Clinical Course User Index [CH] Merryl Hacker, MD    Patient presents with abdominal pain. Generalized. Ongoing for the last several days. Associated vomiting. Nontoxic. Vital signs reassuring. Diffuse tenderness without signs of peritonitis. Initial lab work largely reassuring. Mild leukocytosis. Mild hyperglycemia. See clinical course above.  5:02 AM Patient now tolerating fluids. Reports improved symptoms.  Does report some persistent right upper quadrant pain but improved. Will discharge with Zofran, Bentyl, and Imodium. Follow-up with primary physician.  Final Clinical Impressions(s) / ED Diagnoses   Final diagnoses:  Generalized abdominal pain  Nausea vomiting and diarrhea    New Prescriptions New Prescriptions   DICYCLOMINE (BENTYL) 20 MG TABLET    Take 1 tablet (20 mg total) by mouth 2 (two) times daily as needed for spasms.   LOPERAMIDE (IMODIUM) 2 MG CAPSULE    Take 1 capsule (2 mg total) by mouth 4 (four) times daily as needed for diarrhea or loose stools.   ONDANSETRON (ZOFRAN ODT) 4 MG DISINTEGRATING TABLET    Take 1 tablet (4 mg total) by mouth every 8 (eight) hours as needed for nausea or vomiting.   I personally performed the services described in this documentation, which was scribed in my presence. The recorded information has been reviewed and is accurate.    Merryl Hacker, MD 09/03/16 [phone]

## 2016-09-02 NOTE — ED Triage Notes (Signed)
Pt c/o n/v with abd cramping that started this evening,

## 2016-09-03 ENCOUNTER — Emergency Department (HOSPITAL_COMMUNITY): Payer: PPO

## 2016-09-03 DIAGNOSIS — K5730 Diverticulosis of large intestine without perforation or abscess without bleeding: Secondary | ICD-10-CM | POA: Diagnosis not present

## 2016-09-03 LAB — URINALYSIS, ROUTINE W REFLEX MICROSCOPIC
BACTERIA UA: NONE SEEN
Bilirubin Urine: NEGATIVE
Glucose, UA: >=500 mg/dL — AB
HGB URINE DIPSTICK: NEGATIVE
Ketones, ur: 20 mg/dL — AB
LEUKOCYTES UA: NEGATIVE
Nitrite: NEGATIVE
Protein, ur: 100 mg/dL — AB
Specific Gravity, Urine: 1.014 (ref 1.005–1.030)
pH: 7 (ref 5.0–8.0)

## 2016-09-03 MED ORDER — DICYCLOMINE HCL 20 MG PO TABS: 20.0000 mg | ORAL_TABLET | Freq: Two times a day (BID) | ORAL | 0 refills | Status: DC | PRN

## 2016-09-03 MED ORDER — LOPERAMIDE HCL 2 MG PO CAPS: 2.0000 mg | ORAL_CAPSULE | Freq: Four times a day (QID) | ORAL | 0 refills | Status: DC | PRN

## 2016-09-03 MED ORDER — HYDROMORPHONE HCL 1 MG/ML IJ SOLN
1.0000 mg | Freq: Once | INTRAMUSCULAR | Status: AC
  Administered 2016-09-03: 1 mg via INTRAVENOUS
  Filled 2016-09-03: qty 1

## 2016-09-03 MED ORDER — ONDANSETRON 4 MG PO TBDP: 4.0000 mg | ORAL_TABLET | Freq: Three times a day (TID) | ORAL | 0 refills | Status: DC | PRN

## 2016-09-03 NOTE — Discharge Instructions (Signed)
You were seen today for abdominal pain. You're also having vomiting and diarrhea. Your workup is largely reassuring including a CT scan that is negative. This may be related to a viral illness. Make sure to hydrate aggressively. Follow-up with your primary physician.

## 2016-09-04 LAB — URINE CULTURE: Culture: <10000 — AB

## 2016-09-05 ENCOUNTER — Emergency Department (HOSPITAL_COMMUNITY): Payer: PPO

## 2016-09-05 ENCOUNTER — Inpatient Hospital Stay (HOSPITAL_COMMUNITY)
Admission: EM | Admit: 2016-09-05 | Discharge: 2016-09-29 | DRG: 871 | Disposition: A | Discharge status: Home / Self Care | Payer: PPO | Attending: Internal Medicine | Admitting: Internal Medicine

## 2016-09-05 ENCOUNTER — Inpatient Hospital Stay (HOSPITAL_COMMUNITY): Payer: PPO

## 2016-09-05 ENCOUNTER — Encounter (HOSPITAL_COMMUNITY): Payer: Self-pay | Admitting: Emergency Medicine

## 2016-09-05 DIAGNOSIS — N2889 Other specified disorders of kidney and ureter: Secondary | ICD-10-CM | POA: Diagnosis not present

## 2016-09-05 DIAGNOSIS — E872 Acidosis: Secondary | ICD-10-CM | POA: Diagnosis present

## 2016-09-05 DIAGNOSIS — R6520 Severe sepsis without septic shock: Secondary | ICD-10-CM | POA: Diagnosis not present

## 2016-09-05 DIAGNOSIS — E119 Type 2 diabetes mellitus without complications: Secondary | ICD-10-CM

## 2016-09-05 DIAGNOSIS — R079 Chest pain, unspecified: Secondary | ICD-10-CM | POA: Diagnosis not present

## 2016-09-05 DIAGNOSIS — R10811 Right upper quadrant abdominal tenderness: Secondary | ICD-10-CM | POA: Diagnosis not present

## 2016-09-05 DIAGNOSIS — J9601 Acute respiratory failure with hypoxia: Secondary | ICD-10-CM | POA: Diagnosis not present

## 2016-09-05 DIAGNOSIS — A419 Sepsis, unspecified organism: Secondary | ICD-10-CM | POA: Diagnosis not present

## 2016-09-05 DIAGNOSIS — J90 Pleural effusion, not elsewhere classified: Secondary | ICD-10-CM | POA: Diagnosis not present

## 2016-09-05 DIAGNOSIS — E876 Hypokalemia: Secondary | ICD-10-CM | POA: Diagnosis not present

## 2016-09-05 DIAGNOSIS — N269 Renal sclerosis, unspecified: Secondary | ICD-10-CM | POA: Diagnosis not present

## 2016-09-05 DIAGNOSIS — Z7982 Long term (current) use of aspirin: Secondary | ICD-10-CM | POA: Diagnosis not present

## 2016-09-05 DIAGNOSIS — I480 Paroxysmal atrial fibrillation: Secondary | ICD-10-CM | POA: Diagnosis not present

## 2016-09-05 DIAGNOSIS — Z794 Long term (current) use of insulin: Secondary | ICD-10-CM | POA: Diagnosis not present

## 2016-09-05 DIAGNOSIS — M069 Rheumatoid arthritis, unspecified: Secondary | ICD-10-CM | POA: Diagnosis present

## 2016-09-05 DIAGNOSIS — N184 Chronic kidney disease, stage 4 (severe): Secondary | ICD-10-CM | POA: Diagnosis present

## 2016-09-05 DIAGNOSIS — J9811 Atelectasis: Secondary | ICD-10-CM | POA: Diagnosis present

## 2016-09-05 DIAGNOSIS — K8000 Calculus of gallbladder with acute cholecystitis without obstruction: Secondary | ICD-10-CM | POA: Diagnosis present

## 2016-09-05 DIAGNOSIS — D500 Iron deficiency anemia secondary to blood loss (chronic): Secondary | ICD-10-CM | POA: Diagnosis not present

## 2016-09-05 DIAGNOSIS — E1165 Type 2 diabetes mellitus with hyperglycemia: Secondary | ICD-10-CM | POA: Diagnosis present

## 2016-09-05 DIAGNOSIS — N179 Acute kidney failure, unspecified: Secondary | ICD-10-CM

## 2016-09-05 DIAGNOSIS — N051 Unspecified nephritic syndrome with focal and segmental glomerular lesions: Secondary | ICD-10-CM | POA: Diagnosis not present

## 2016-09-05 DIAGNOSIS — K2970 Gastritis, unspecified, without bleeding: Secondary | ICD-10-CM | POA: Diagnosis not present

## 2016-09-05 DIAGNOSIS — R918 Other nonspecific abnormal finding of lung field: Secondary | ICD-10-CM | POA: Diagnosis not present

## 2016-09-05 DIAGNOSIS — L8990 Pressure ulcer of unspecified site, unspecified stage: Secondary | ICD-10-CM | POA: Insufficient documentation

## 2016-09-05 DIAGNOSIS — R7989 Other specified abnormal findings of blood chemistry: Secondary | ICD-10-CM

## 2016-09-05 DIAGNOSIS — N170 Acute kidney failure with tubular necrosis: Secondary | ICD-10-CM | POA: Diagnosis present

## 2016-09-05 DIAGNOSIS — Z9889 Other specified postprocedural states: Secondary | ICD-10-CM

## 2016-09-05 DIAGNOSIS — R140 Abdominal distension (gaseous): Secondary | ICD-10-CM

## 2016-09-05 DIAGNOSIS — K5900 Constipation, unspecified: Secondary | ICD-10-CM | POA: Diagnosis not present

## 2016-09-05 DIAGNOSIS — J189 Pneumonia, unspecified organism: Secondary | ICD-10-CM | POA: Diagnosis not present

## 2016-09-05 DIAGNOSIS — Z4901 Encounter for fitting and adjustment of extracorporeal dialysis catheter: Secondary | ICD-10-CM | POA: Diagnosis not present

## 2016-09-05 DIAGNOSIS — R061 Stridor: Secondary | ICD-10-CM | POA: Diagnosis not present

## 2016-09-05 DIAGNOSIS — J9690 Respiratory failure, unspecified, unspecified whether with hypoxia or hypercapnia: Secondary | ICD-10-CM | POA: Diagnosis not present

## 2016-09-05 DIAGNOSIS — E871 Hypo-osmolality and hyponatremia: Secondary | ICD-10-CM | POA: Diagnosis not present

## 2016-09-05 DIAGNOSIS — J181 Lobar pneumonia, unspecified organism: Secondary | ICD-10-CM

## 2016-09-05 DIAGNOSIS — Z79899 Other long term (current) drug therapy: Secondary | ICD-10-CM

## 2016-09-05 DIAGNOSIS — J984 Other disorders of lung: Secondary | ICD-10-CM | POA: Diagnosis not present

## 2016-09-05 DIAGNOSIS — R846 Abnormal cytological findings in specimens from respiratory organs and thorax: Secondary | ICD-10-CM | POA: Diagnosis not present

## 2016-09-05 DIAGNOSIS — E1122 Type 2 diabetes mellitus with diabetic chronic kidney disease: Secondary | ICD-10-CM | POA: Diagnosis present

## 2016-09-05 DIAGNOSIS — M6281 Muscle weakness (generalized): Secondary | ICD-10-CM

## 2016-09-05 DIAGNOSIS — I4892 Unspecified atrial flutter: Secondary | ICD-10-CM | POA: Diagnosis not present

## 2016-09-05 DIAGNOSIS — R0902 Hypoxemia: Secondary | ICD-10-CM

## 2016-09-05 DIAGNOSIS — R509 Fever, unspecified: Secondary | ICD-10-CM

## 2016-09-05 DIAGNOSIS — E11649 Type 2 diabetes mellitus with hypoglycemia without coma: Secondary | ICD-10-CM | POA: Diagnosis not present

## 2016-09-05 DIAGNOSIS — A4151 Sepsis due to Escherichia coli [E. coli]: Principal | ICD-10-CM | POA: Diagnosis present

## 2016-09-05 DIAGNOSIS — K4090 Unilateral inguinal hernia, without obstruction or gangrene, not specified as recurrent: Secondary | ICD-10-CM | POA: Diagnosis not present

## 2016-09-05 DIAGNOSIS — I10 Essential (primary) hypertension: Secondary | ICD-10-CM | POA: Diagnosis not present

## 2016-09-05 DIAGNOSIS — D689 Coagulation defect, unspecified: Secondary | ICD-10-CM | POA: Diagnosis not present

## 2016-09-05 DIAGNOSIS — R778 Other specified abnormalities of plasma proteins: Secondary | ICD-10-CM

## 2016-09-05 DIAGNOSIS — E875 Hyperkalemia: Secondary | ICD-10-CM | POA: Diagnosis not present

## 2016-09-05 DIAGNOSIS — I4891 Unspecified atrial fibrillation: Secondary | ICD-10-CM | POA: Diagnosis not present

## 2016-09-05 DIAGNOSIS — K810 Acute cholecystitis: Secondary | ICD-10-CM

## 2016-09-05 DIAGNOSIS — I482 Chronic atrial fibrillation: Secondary | ICD-10-CM | POA: Diagnosis not present

## 2016-09-05 DIAGNOSIS — R05 Cough: Secondary | ICD-10-CM | POA: Diagnosis not present

## 2016-09-05 DIAGNOSIS — Z87891 Personal history of nicotine dependence: Secondary | ICD-10-CM

## 2016-09-05 DIAGNOSIS — R748 Abnormal levels of other serum enzymes: Secondary | ICD-10-CM | POA: Diagnosis not present

## 2016-09-05 DIAGNOSIS — R0600 Dyspnea, unspecified: Secondary | ICD-10-CM

## 2016-09-05 DIAGNOSIS — R109 Unspecified abdominal pain: Secondary | ICD-10-CM | POA: Diagnosis not present

## 2016-09-05 DIAGNOSIS — R0689 Other abnormalities of breathing: Secondary | ICD-10-CM

## 2016-09-05 DIAGNOSIS — N189 Chronic kidney disease, unspecified: Secondary | ICD-10-CM | POA: Diagnosis not present

## 2016-09-05 DIAGNOSIS — R062 Wheezing: Secondary | ICD-10-CM

## 2016-09-05 DIAGNOSIS — T8241XA Breakdown (mechanical) of vascular dialysis catheter, initial encounter: Secondary | ICD-10-CM

## 2016-09-05 DIAGNOSIS — R0602 Shortness of breath: Secondary | ICD-10-CM | POA: Diagnosis not present

## 2016-09-05 LAB — URINALYSIS, ROUTINE W REFLEX MICROSCOPIC
Bilirubin Urine: NEGATIVE
GLUCOSE, UA: 150 mg/dL — AB
KETONES UR: 20 mg/dL — AB
LEUKOCYTES UA: NEGATIVE
NITRITE: NEGATIVE
PH: 5 (ref 5.0–8.0)
Protein, ur: >=300 mg/dL — AB
Specific Gravity, Urine: 1.022 (ref 1.005–1.030)

## 2016-09-05 LAB — CBC WITH DIFFERENTIAL/PLATELET
BASOS ABS: 0 10*3/uL (ref 0.0–0.1)
BASOS PCT: 0 %
Eosinophils Absolute: 0 10*3/uL (ref 0.0–0.7)
Eosinophils Relative: 0 %
HEMATOCRIT: 40.8 % (ref 39.0–52.0)
HEMOGLOBIN: 13.5 g/dL (ref 13.0–17.0)
Lymphocytes Relative: 5 %
Lymphs Abs: 0.6 10*3/uL — ABNORMAL LOW (ref 0.7–4.0)
MCH: 27.8 pg (ref 26.0–34.0)
MCHC: 33.1 g/dL (ref 30.0–36.0)
MCV: 84.1 fL (ref 78.0–100.0)
MONOS PCT: 4 %
Monocytes Absolute: 0.4 10*3/uL (ref 0.1–1.0)
NEUTROS ABS: 11.4 10*3/uL — AB (ref 1.7–7.7)
NEUTROS PCT: 91 %
Platelets: 137 10*3/uL — ABNORMAL LOW (ref 150–400)
RBC: 4.85 MIL/uL (ref 4.22–5.81)
RDW: 14.4 % (ref 11.5–15.5)
WBC: 12.5 10*3/uL — AB (ref 4.0–10.5)

## 2016-09-05 LAB — COMPREHENSIVE METABOLIC PANEL
ALK PHOS: 86 U/L (ref 38–126)
ALT: 21 U/L (ref 17–63)
ANION GAP: 14 (ref 5–15)
AST: 23 U/L (ref 15–41)
Albumin: 3 g/dL — ABNORMAL LOW (ref 3.5–5.0)
BUN: 26 mg/dL — ABNORMAL HIGH (ref 6–20)
CALCIUM: 8.3 mg/dL — AB (ref 8.9–10.3)
CO2: 23 mmol/L (ref 22–32)
Chloride: 98 mmol/L — ABNORMAL LOW (ref 101–111)
Creatinine, Ser: 2.57 mg/dL — ABNORMAL HIGH (ref 0.61–1.24)
GFR calc non Af Amer: 24 mL/min — ABNORMAL LOW (ref >60)
GFR, EST AFRICAN AMERICAN: 28 mL/min — AB (ref >60)
Glucose, Bld: 234 mg/dL — ABNORMAL HIGH (ref 65–99)
Potassium: 3.2 mmol/L — ABNORMAL LOW (ref 3.5–5.1)
SODIUM: 135 mmol/L (ref 135–145)
TOTAL PROTEIN: 7.4 g/dL (ref 6.5–8.1)
Total Bilirubin: 2.4 mg/dL — ABNORMAL HIGH (ref 0.3–1.2)

## 2016-09-05 LAB — LIPASE, BLOOD: Lipase: 22 U/L (ref 11–51)

## 2016-09-05 LAB — GLUCOSE, CAPILLARY
GLUCOSE-CAPILLARY: 228 mg/dL — AB (ref 65–99)
GLUCOSE-CAPILLARY: 221 mg/dL — AB (ref 65–99)

## 2016-09-05 LAB — TROPONIN I
TROPONIN I: 0.05 ng/mL — AB (ref <0.03)
Troponin I: 0.06 ng/mL (ref <0.03)
Troponin I: 0.05 ng/mL (ref <0.03)

## 2016-09-05 LAB — I-STAT CG4 LACTIC ACID, ED
LACTIC ACID, VENOUS: 0.99 mmol/L (ref 0.5–1.9)
Lactic Acid, Venous: 2.8 mmol/L (ref 0.5–1.9)

## 2016-09-05 MED ORDER — DEXTROSE 5 % IV SOLN
1.0000 g | Freq: Once | INTRAVENOUS | Status: AC
  Administered 2016-09-05: 1 g via INTRAVENOUS
  Filled 2016-09-05: qty 10

## 2016-09-05 MED ORDER — POTASSIUM CHLORIDE IN NACL 20-0.9 MEQ/L-% IV SOLN
INTRAVENOUS | Status: DC
  Administered 2016-09-05: 22:00:00 via INTRAVENOUS

## 2016-09-05 MED ORDER — SODIUM CHLORIDE 0.9 % IV BOLUS (SEPSIS): 1000.0000 mL | Freq: Once | INTRAVENOUS | Status: DC

## 2016-09-05 MED ORDER — SODIUM CHLORIDE 0.9 % IV SOLN
INTRAVENOUS | Status: AC
  Administered 2016-09-05: 16:00:00 via INTRAVENOUS

## 2016-09-05 MED ORDER — MORPHINE SULFATE (PF) 4 MG/ML IV SOLN
INTRAVENOUS | Status: AC
  Filled 2016-09-05: qty 1

## 2016-09-05 MED ORDER — DEXTROSE 5 % IV SOLN
1.0000 g | INTRAVENOUS | Status: DC
  Filled 2016-09-05: qty 10

## 2016-09-05 MED ORDER — INSULIN ASPART 100 UNIT/ML ~~LOC~~ SOLN
4.0000 [IU] | Freq: Three times a day (TID) | SUBCUTANEOUS | Status: DC
  Administered 2016-09-05 – 2016-09-09 (×11): 4 [IU] via SUBCUTANEOUS

## 2016-09-05 MED ORDER — VITAMIN D 1000 UNITS PO TABS
1000.0000 [IU] | ORAL_TABLET | Freq: Every day | ORAL | Status: DC
  Administered 2016-09-05 – 2016-09-29 (×25): 1000 [IU] via ORAL
  Filled 2016-09-05 (×25): qty 1

## 2016-09-05 MED ORDER — ASPIRIN 325 MG PO TABS
325.0000 mg | ORAL_TABLET | Freq: Once | ORAL | Status: AC
  Administered 2016-09-05: 325 mg via ORAL
  Filled 2016-09-05: qty 1

## 2016-09-05 MED ORDER — INSULIN GLARGINE 100 UNIT/ML ~~LOC~~ SOLN
35.0000 [IU] | Freq: Every morning | SUBCUTANEOUS | Status: DC
  Filled 2016-09-05 (×2): qty 0.45

## 2016-09-05 MED ORDER — INSULIN ASPART 100 UNIT/ML ~~LOC~~ SOLN
0.0000 [IU] | Freq: Three times a day (TID) | SUBCUTANEOUS | Status: DC
  Administered 2016-09-05: 5 [IU] via SUBCUTANEOUS
  Administered 2016-09-06: 8 [IU] via SUBCUTANEOUS
  Administered 2016-09-06: 5 [IU] via SUBCUTANEOUS
  Administered 2016-09-06 – 2016-09-07 (×2): 8 [IU] via SUBCUTANEOUS
  Administered 2016-09-07: 11 [IU] via SUBCUTANEOUS
  Administered 2016-09-07: 5 [IU] via SUBCUTANEOUS
  Administered 2016-09-08: 2 [IU] via SUBCUTANEOUS
  Administered 2016-09-08: 3 [IU] via SUBCUTANEOUS
  Administered 2016-09-09: 15 [IU] via SUBCUTANEOUS
  Administered 2016-09-09: 10 [IU] via SUBCUTANEOUS

## 2016-09-05 MED ORDER — INSULIN GLARGINE 100 UNIT/ML ~~LOC~~ SOLN
40.0000 [IU] | Freq: Every morning | SUBCUTANEOUS | Status: DC
  Administered 2016-09-06 – 2016-09-08 (×3): 40 [IU] via SUBCUTANEOUS
  Filled 2016-09-05 (×6): qty 0.4

## 2016-09-05 MED ORDER — DILTIAZEM HCL ER 180 MG PO CP24
180.0000 mg | ORAL_CAPSULE | Freq: Every day | ORAL | Status: DC
  Administered 2016-09-05 – 2016-09-08 (×4): 180 mg via ORAL
  Filled 2016-09-05 (×9): qty 1

## 2016-09-05 MED ORDER — ASPIRIN EC 81 MG PO TBEC
81.0000 mg | DELAYED_RELEASE_TABLET | Freq: Every day | ORAL | Status: DC
  Administered 2016-09-05 – 2016-09-18 (×14): 81 mg via ORAL
  Filled 2016-09-05 (×14): qty 1

## 2016-09-05 MED ORDER — ONDANSETRON HCL 4 MG/2ML IJ SOLN: 4.0000 mg | Freq: Four times a day (QID) | INTRAMUSCULAR | Status: DC | PRN

## 2016-09-05 MED ORDER — ACETAMINOPHEN 325 MG PO TABS
650.0000 mg | ORAL_TABLET | Freq: Four times a day (QID) | ORAL | Status: DC | PRN
  Administered 2016-09-05: 650 mg via ORAL
  Filled 2016-09-05: qty 2

## 2016-09-05 MED ORDER — SODIUM CHLORIDE 0.9 % IV BOLUS (SEPSIS)
500.0000 mL | Freq: Once | INTRAVENOUS | Status: AC
  Administered 2016-09-05: 500 mL via INTRAVENOUS

## 2016-09-05 MED ORDER — AZITHROMYCIN 500 MG PO TABS
500.0000 mg | ORAL_TABLET | ORAL | Status: DC
  Administered 2016-09-06 – 2016-09-07 (×2): 500 mg via ORAL
  Filled 2016-09-05 (×3): qty 2

## 2016-09-05 MED ORDER — POTASSIUM CHLORIDE CRYS ER 20 MEQ PO TBCR
40.0000 meq | EXTENDED_RELEASE_TABLET | Freq: Two times a day (BID) | ORAL | Status: DC
  Administered 2016-09-05 (×2): 40 meq via ORAL
  Filled 2016-09-05 (×2): qty 2

## 2016-09-05 MED ORDER — IPRATROPIUM-ALBUTEROL 0.5-2.5 (3) MG/3ML IN SOLN: 3.0000 mL | Freq: Three times a day (TID) | RESPIRATORY_TRACT | Status: DC

## 2016-09-05 MED ORDER — PIPERACILLIN-TAZOBACTAM 3.375 G IVPB
3.3750 g | Freq: Three times a day (TID) | INTRAVENOUS | Status: DC
  Administered 2016-09-05 – 2016-09-08 (×9): 3.375 g via INTRAVENOUS
  Filled 2016-09-05 (×8): qty 50

## 2016-09-05 MED ORDER — ONDANSETRON HCL 4 MG/2ML IJ SOLN
4.0000 mg | Freq: Once | INTRAMUSCULAR | Status: AC
  Administered 2016-09-05: 4 mg via INTRAVENOUS
  Filled 2016-09-05: qty 2

## 2016-09-05 MED ORDER — SODIUM CHLORIDE 0.9 % IV BOLUS (SEPSIS)
1000.0000 mL | Freq: Once | INTRAVENOUS | Status: AC
  Administered 2016-09-05: 1000 mL via INTRAVENOUS

## 2016-09-05 MED ORDER — ATORVASTATIN CALCIUM 40 MG PO TABS
40.0000 mg | ORAL_TABLET | Freq: Every day | ORAL | Status: DC
  Administered 2016-09-05 – 2016-09-07 (×3): 40 mg via ORAL
  Filled 2016-09-05 (×3): qty 1

## 2016-09-05 MED ORDER — MORPHINE SULFATE (PF) 4 MG/ML IV SOLN
4.0000 mg | Freq: Once | INTRAVENOUS | Status: AC
Start: 2016-09-05 — End: 2016-09-05
  Administered 2016-09-05: 4 mg via INTRAVENOUS
  Filled 2016-09-05: qty 1

## 2016-09-05 MED ORDER — AZITHROMYCIN 500 MG IV SOLR
500.0000 mg | Freq: Once | INTRAVENOUS | Status: AC
  Administered 2016-09-05: 500 mg via INTRAVENOUS
  Filled 2016-09-05: qty 500

## 2016-09-05 MED ORDER — SODIUM CHLORIDE 0.9 % IV SOLN
Freq: Once | INTRAVENOUS | Status: AC
  Administered 2016-09-05: 08:00:00 via INTRAVENOUS

## 2016-09-05 MED ORDER — IPRATROPIUM-ALBUTEROL 0.5-2.5 (3) MG/3ML IN SOLN
3.0000 mL | Freq: Four times a day (QID) | RESPIRATORY_TRACT | Status: DC
  Administered 2016-09-05 (×2): 3 mL via RESPIRATORY_TRACT
  Filled 2016-09-05 (×2): qty 3

## 2016-09-05 MED ORDER — POLYETHYLENE GLYCOL 3350 17 G PO PACK
17.0000 g | PACK | Freq: Every day | ORAL | Status: DC
  Administered 2016-09-05 – 2016-09-07 (×3): 17 g via ORAL
  Filled 2016-09-05 (×3): qty 1

## 2016-09-05 MED ORDER — DICYCLOMINE HCL 10 MG PO CAPS
20.0000 mg | ORAL_CAPSULE | Freq: Two times a day (BID) | ORAL | Status: DC | PRN
  Filled 2016-09-05: qty 2

## 2016-09-05 MED ORDER — HEPARIN SODIUM (PORCINE) 5000 UNIT/ML IJ SOLN
5000.0000 [IU] | Freq: Three times a day (TID) | INTRAMUSCULAR | Status: DC
  Administered 2016-09-05 – 2016-09-07 (×6): 5000 [IU] via SUBCUTANEOUS
  Filled 2016-09-05 (×6): qty 1

## 2016-09-05 MED ORDER — CLONIDINE HCL 0.2 MG PO TABS
0.2000 mg | ORAL_TABLET | Freq: Every day | ORAL | Status: DC
  Administered 2016-09-05 – 2016-09-08 (×4): 0.2 mg via ORAL
  Filled 2016-09-05 (×4): qty 1

## 2016-09-05 MED ORDER — INSULIN ASPART 100 UNIT/ML ~~LOC~~ SOLN
0.0000 [IU] | Freq: Every day | SUBCUTANEOUS | Status: DC
  Administered 2016-09-05: 2 [IU] via SUBCUTANEOUS
  Administered 2016-09-06 – 2016-09-08 (×2): 3 [IU] via SUBCUTANEOUS

## 2016-09-05 MED ORDER — LOPERAMIDE HCL 2 MG PO CAPS: 2.0000 mg | ORAL_CAPSULE | Freq: Four times a day (QID) | ORAL | Status: DC | PRN

## 2016-09-05 MED ORDER — ACETAMINOPHEN 500 MG PO TABS
1000.0000 mg | ORAL_TABLET | Freq: Once | ORAL | Status: AC
  Administered 2016-09-05: 1000 mg via ORAL
  Filled 2016-09-05: qty 2

## 2016-09-05 NOTE — Progress Notes (Signed)
PT Cancellation Note  Patient Details Name: Gary Lang MRN: [id number] DOB: February 10, 1951   Cancelled Treatment:     Pt states that it has been to busy of a day refuses to ambulate.  Pt PLOF ambulatory without assistive device/drives/community ambulatory. Rayetta Humphrey, [place] CLT [phone] 09/05/2016, 4:03 PM

## 2016-09-05 NOTE — Progress Notes (Signed)
Pharmacy Antibiotic Note  Gary Lang is a 66 y.o. male admitted on 09/05/2016 with bacteremia.  Pharmacy has been consulted for zosyn dosing.  Plan: Zosyn 3.375g IV q8h (4 hour infusion). F/U cultures and clinical progress  Height: '6\' 1"'$  (185.4 cm) Weight: 225 lb (102.1 kg) IBW/kg (Calculated) : 79.9  Temp (24hrs), Avg:100.5 F (38.1 C), Min:98.3 F (36.8 C), Max:104 F (40 C)   Recent Labs Lab 09/02/16 2200 09/02/16 2320 09/05/16 0721 09/05/16 0738 09/05/16 1010  WBC 12.9*  --  12.5*  --   --   CREATININE 1.25*  --  2.57*  --   --   LATICACIDVEN  --  1.3  --  2.80* 0.99    Normalized CrCl 28 mls/min  Estimated Creatinine Clearance: 35.5 mL/min (by C-G formula based on SCr of 2.57 mg/dL (H)).    Allergies  Allergen Reactions  . Glyburide Other (See Comments)    unknown  . Methotrexate Derivatives Other (See Comments)    Patient cannot remember    Antimicrobials this admission: Zosyn 1/4 >>  Ceftriaxone 1/4>> 1/4 Azithromycin 1/4>>  Dose adjustments this admission:n/a  Microbiology results: 1/4 BCx: GNR in 2 bottles 1/4 UCx: pending   Thank you for allowing pharmacy to be a part of this patient's care.  Isac Sarna, BS [place], [place] Clinical Pharmacist Pager [phone] 09/05/2016 7:48 PM

## 2016-09-05 NOTE — ED Provider Notes (Signed)
[facility] DEPT Provider Note   CSN: [id number] Arrival date & time: 09/05/16  0702     History   Chief Complaint Chief Complaint  Patient presents with  . Abdominal Pain    HPI Tracer L Hitchens is a 66 y.o. male.  Pt presents to the ED today with abdominal pain.  Pt was here on 1/1 for the same.  He had labs and CT abdomen.  All were negative.  Pt has seen his pcp as well.  Pt still with abdominal pain.  Now he has SOB and O2 sats are low.  He was 90% on RA.  EMS put him on 2L and he is now 93%.  The pt has not taken any medications this morning.  He has not taken any tylenol for his fever.  He has not had any more n/v in the past few days, but he has not been eating.      Past Medical History:  Diagnosis Date  . Chronic kidney disease   . Diabetes mellitus without complication ([place])   . Hypertension   . Rheumatoid arthritis ([place])   . Vitamin D deficiency    Hx: of    Patient Active Problem List   Diagnosis Date Noted  . CAP (community acquired pneumonia) 09/05/2016  . Erectile dysfunction 02/01/2014  . Rash and nonspecific skin eruption 11/04/2013  . Esophageal reflux 11/04/2013  . End stage renal disease ([place]) 09/23/2013  . AVF (arteriovenous fistula) ([place]) 08/17/2013  . Glomerulonephritis, focal sclerosing 07/06/2013  . Diabetes mellitus ([place]) 02/07/2012  . Hypertension 09/02/2008  . Rheumatoid arthritis(714.0) 09/02/1996    Past Surgical History:  Procedure Laterality Date  . AV FISTULA PLACEMENT Left 06/28/2013   Procedure: ARTERIOVENOUS (AV) FISTULA CREATION- Left arm with ultrasound guidance;  Surgeon: Rosetta Posner, MD;  Location: [facility] OR;  Service: Vascular;  Laterality: Left;  . COLONOSCOPY     hx: of  . HERNIA REPAIR    . INSERTION OF DIALYSIS CATHETER N/A 06/28/2013   Procedure: INSERTION OF DIALYSIS CATHETER;  Surgeon: Rosetta Posner, MD;  Location: [facility] OR;  Service: Vascular;  Laterality: N/A;  . LIGATION OF COMPETING BRANCHES OF ARTERIOVENOUS  FISTULA Left 10/12/2013   Procedure: LIGATION OF COMPETING BRANCHES OF ARTERIOVENOUS FISTULA;  Surgeon: Elam Dutch, MD;  Location: [facility];  Service: Vascular;  Laterality: Left;  . neck surgery         Home Medications    Prior to Admission medications   Medication Sig Start Date End Date Taking? Authorizing Provider  acetaminophen (TYLENOL) 500 MG tablet Take 2 tablets (1,000 mg total) by mouth 3 (three) times daily as needed for pain. 02/07/12   Zenia Resides, MD  aspirin 81 MG EC tablet Take 1 tablet (81 mg total) by mouth daily. Swallow whole. 02/07/12   Zenia Resides, MD  atorvastatin (LIPITOR) 40 MG tablet Take 1 tablet by mouth daily. 08/13/16   Historical Provider, MD  cholecalciferol (VITAMIN D) 1000 units tablet Take 1,000 Units by mouth daily.    Historical Provider, MD  cloNIDine (CATAPRES) 0.2 MG tablet Take 0.2 mg by mouth daily.    Historical Provider, MD  dicyclomine (BENTYL) 20 MG tablet Take 1 tablet (20 mg total) by mouth 2 (two) times daily as needed for spasms. 09/03/16   Merryl Hacker, MD  diltiazem (DILACOR XR) 180 MG 24 hr capsule Take 1 capsule (180 mg total) by mouth daily. 08/05/13   Waldemar Dickens, MD  furosemide (LASIX)  80 MG tablet Take 80 mg by mouth See admin instructions. Tuesday, Thursday and Saturday.    Historical Provider, MD  insulin aspart (NOVOLOG FLEXPEN) 100 UNIT/ML FlexPen Inject 7.5 Units into the skin 3 (three) times daily with meals. Patient taking differently: Inject 18 Units into the skin 3 (three) times daily with meals.  01/31/14   Waldemar Dickens, MD  Insulin Glargine (LANTUS) 100 UNIT/ML Solostar Pen Inject 20 Units into the skin every morning. Increase 5 u if am CBG is greater than 200.  Decrease 5 u if am CBG is less than 150 Patient taking differently: Inject 34 Units into the skin every morning. Increase 5 u if am CBG is greater than 200.  Decrease 5 u if am CBG is less than 150 09/05/14   Aquilla Hacker, MD  Insulin Pen Needle  (PEN NEEDLES) 31G X 6 MM MISC Use 1 needle per injection as directed 07/01/13   Patrecia Pour, MD  loperamide (IMODIUM) 2 MG capsule Take 1 capsule (2 mg total) by mouth 4 (four) times daily as needed for diarrhea or loose stools. 09/03/16   Merryl Hacker, MD  ondansetron (ZOFRAN ODT) 4 MG disintegrating tablet Take 1 tablet (4 mg total) by mouth every 8 (eight) hours as needed for nausea or vomiting. 09/03/16   Merryl Hacker, MD  sildenafil (VIAGRA) 50 MG tablet Take 0.5-1 tablets (25-50 mg total) by mouth daily as needed for erectile dysfunction. 02/01/14   Waldemar Dickens, MD  TRULICITY 1.5 ZD/6.3OV SOPN Inject 1 Units as directed once a week. Patient isn't sure how many units he takes 08/13/16   Historical Provider, MD  UNIFINE PENTIPS 31G X 5 MM MISC USE AS DIRECTED WITH INSULIN    Waldemar Dickens, MD    Family History Family History  Problem Relation Age of Onset  . Cancer Mother   . Arthritis/Rheumatoid Sister     Social History Social History  Substance Use Topics  . Smoking status: Former Smoker    Packs/day: 0.50    Years: 10.00    Types: Cigarettes    Quit date: 09/02/1976  . Smokeless tobacco: Never Used  . Alcohol use No     Allergies   Glyburide and Methotrexate derivatives   Review of Systems Review of Systems  Constitutional: Positive for activity change, appetite change, fatigue and fever.  Respiratory: Positive for shortness of breath.   Gastrointestinal: Positive for abdominal pain and nausea.  All other systems reviewed and are negative.    Physical Exam Updated Vital Signs BP 167/65   Pulse (!) 124   Temp (!) 104 F (40 C) (Rectal)   Resp (!) 36   Ht '6\' 1"'$  (1.854 m)   Wt 225 lb (102.1 kg)   SpO2 92%   BMI 29.69 kg/m   Physical Exam  Constitutional: He is oriented to person, place, and time. He appears well-developed and well-nourished. He appears distressed.  HENT:  Head: Normocephalic and atraumatic.  Right Ear: External ear normal.    Left Ear: External ear normal.  Nose: Nose normal.  Mouth/Throat: Oropharynx is clear and moist.  Eyes: Conjunctivae are normal. Pupils are equal, round, and reactive to light.  Neck: Normal range of motion. Neck supple.  Cardiovascular: Regular rhythm, normal heart sounds and intact distal pulses.  Tachycardia present.   Pulmonary/Chest: Effort normal and breath sounds normal. Tachypnea noted.  Abdominal: Soft. Bowel sounds are normal. There is tenderness in the right upper quadrant.  Musculoskeletal: Normal range of motion.       Arms: Neurological: He is alert and oriented to person, place, and time.  Skin: Skin is warm.  Psychiatric: He has a normal mood and affect. His behavior is normal. Judgment and thought content normal.  Nursing note and vitals reviewed.    ED Treatments / Results  Labs (all labs ordered are listed, but only abnormal results are displayed) Labs Reviewed  COMPREHENSIVE METABOLIC PANEL - Abnormal; Notable for the following:       Result Value   Potassium 3.2 (*)    Chloride 98 (*)    Glucose, Bld 234 (*)    BUN 26 (*)    Creatinine, Ser 2.57 (*)    Calcium 8.3 (*)    Albumin 3.0 (*)    Total Bilirubin 2.4 (*)    GFR calc non Af Amer 24 (*)    GFR calc Af Amer 28 (*)    All other components within normal limits  CBC WITH DIFFERENTIAL/PLATELET - Abnormal; Notable for the following:    WBC 12.5 (*)    Platelets 137 (*)    Neutro Abs 11.4 (*)    Lymphs Abs 0.6 (*)    All other components within normal limits  TROPONIN I - Abnormal; Notable for the following:    Troponin I 0.06 (*)    All other components within normal limits  I-STAT CG4 LACTIC ACID, ED - Abnormal; Notable for the following:    Lactic Acid, Venous 2.80 (*)    All other components within normal limits  CULTURE, BLOOD (ROUTINE X 2)  CULTURE, BLOOD (ROUTINE X 2)  URINE CULTURE  LIPASE, BLOOD  URINALYSIS, ROUTINE W REFLEX MICROSCOPIC    EKG  EKG  Interpretation  Date/Time:  Thursday September 05 2016 07:03:29 EST Ventricular Rate:  134 PR Interval:    QRS Duration: 90 QT Interval:  291 QTC Calculation: 435 R Axis:   19 Text Interpretation:  Sinus tachycardia Borderline T wave abnormalities Confirmed by Shemaiah Round MD, Rebekah Zackery ([id number]) on 09/05/2016 7:17:47 AM       Radiology Dg Chest 2 View  Result Date: 09/05/2016 CLINICAL DATA:  66 year old male with right upper quadrant pain, shortness of breath and generalized weakness for the past 4 days EXAM: CHEST  2 VIEW COMPARISON:  Prior chest x-ray 10/12/2013 FINDINGS: Interval removal of right IJ approach tunneled hemodialysis catheter. New patchy right lower lobe airspace opacity predominantly in the lower lobe. Additionally, there is atelectasis in the right middle lobe. Suspect a small layering pleural effusion as well. Stable borderline cardiomegaly. Probable trace chronic left pleural effusion. Mild vascular congestion without overt edema. No acute osseous abnormality. IMPRESSION: 1. New patchy airspace opacity in the right lower lobe concerning for pneumonia. Followup PA and lateral chest X-ray is recommended in 3-4 weeks following trial of antibiotic therapy to ensure resolution and exclude underlying malignancy. 2. Right middle lobe atelectasis. 3. Small right pleural effusion is likely parapneumonic. 4. Stable cardiomegaly and mild vascular congestion without overt edema. 5. Similar appearance of chronic small left pleural effusion. Electronically Signed   By: Jacqulynn Cadet M.D.   On: 09/05/2016 08:27    Procedures Procedures (including critical care time)  Medications Ordered in ED Medications  sodium chloride 0.9 % bolus 1,000 mL (not administered)    And  sodium chloride 0.9 % bolus 500 mL (not administered)  cefTRIAXone (ROCEPHIN) 1 g in dextrose 5 % 50 mL IVPB (1 g Intravenous New Bag/Given 09/05/16 0842)  azithromycin (ZITHROMAX) 500 mg in dextrose 5 % 250 mL IVPB (not  administered)  aspirin tablet 325 mg (not administered)  acetaminophen (TYLENOL) tablet 1,000 mg (1,000 mg Oral Given 09/05/16 0736)  morphine 4 MG/ML injection 4 mg (4 mg Intravenous Given 09/05/16 0737)  ondansetron (ZOFRAN) injection 4 mg (4 mg Intravenous Given 09/05/16 0737)  0.9 %  sodium chloride infusion ( Intravenous Stopped 09/05/16 0828)  sodium chloride 0.9 % bolus 1,000 mL (1,000 mLs Intravenous New Bag/Given 09/05/16 0826)     Initial Impression / Assessment and Plan / ED Course  I have reviewed the triage vital signs and the nursing notes.  Pertinent labs & imaging results that were available during my care of the patient were reviewed by me and considered in my medical decision making (see chart for details).  Clinical Course    Studies and CT scan reviewed from 1/1.  Pt treated with IVFs, tylenol, morphine, zofran.  Pt's CXR does show a RLL pneumonia which is why he is having RUQ abdominal pain.  The pt will be treated for CAP with the preferred regimen.  I did not give the full fluid dose of IVNS initially as I was unsure of his fluid status with the hx of renal failure.  However, he was given the full 3600 mL after I saw that he was not in CHF and was dry.  He is feeling better after the fluid and meds, but HR is still high.  Pt's troponin is mildly elevated.  It is probably from the AKI, but I did given him ASA which will help the fever as well.  Pt d/w Dr. Carolin Sicks (triad) for admission.  Final Clinical Impressions(s) / ED Diagnoses   Final diagnoses:  Community acquired pneumonia of right lower lobe of lung ([place])  Sepsis, due to unspecified organism ([place])  Fever, unspecified fever cause  AKI (acute kidney injury) ([place])  Hypoxia  Elevated troponin    New Prescriptions New Prescriptions   No medications on file     Isla Pence, MD 09/05/16 [phone]

## 2016-09-05 NOTE — ED Notes (Signed)
Patient still unable to provide urine specimen

## 2016-09-05 NOTE — Progress Notes (Signed)
CRITICAL VALUE ALERT  Critical value received:  Blood cultures.Marland Kitchengram negative rods    Date of notification:  09/05/16  Time of notification:  1930  Critical value read back:Yes.    Nurse who received alert:  B. Joaquim Lai, RN  MD notified (1st page):  Dr. Eulas Post  Time of first page:  [phone]  Responding MD:  Dr. Eulas Post  Time MD responded:  [phone]

## 2016-09-05 NOTE — ED Triage Notes (Signed)
RUQ abdominal pain x 4 days, seen PCP,  Decreased appetite, EMS arrived pt was 90% O2, put on 2 L, BS 269, Fever, HR 122. EMS started IV and give 100ML NS

## 2016-09-05 NOTE — H&P (Addendum)
History and Physical    Gary Lang NOB:[id number] DOB: 1951/04/22 DOA: 09/05/2016  PCP: [name], MD  Patient coming from: Home  Chief Complaint: Fever and right abdomen pain.  HPI: Gary Lang is a 66 y.o. male with medical history significant of chronic kidney disease, diabetes, hypertension, rheumatoid arthritis, ex-smoker quit about 20 years ago presented with fever up to 102 at home associated with chills and right-sided chest pain. Patient visited a few days ago to ER with right-sided chest and abdominal pain when CT scan of abdomen and pelvis was unremarkable and patient was discharged home. Patient came with worsening symptoms. Denied sick contact or recent travel. Reported sore throat, shortness of breath however denied cough or chest pain. He reports the pain is mostly on right side of the abdomen, mostly presented with occasional worsening. Not associated with nausea vomiting or diarrhea. Last bowel movement was 3 days ago which was hard stool. Also reported generalized weakness and decreased oral intake. Denied urinary symptoms. ED Course: In the ER patient was found to have right lower lobe pneumonia treated with ceftriaxone and azithromycin. Sepsis protocol. Also found to have acute on chronic kidney disease. Mildly positive troponin. Admitted for further evaluation.  Review of Systems: As per HPI otherwise 10 point review of systems negative.    Past Medical History:  Diagnosis Date  . Chronic kidney disease   . Diabetes mellitus without complication ([place])   . Hypertension   . Rheumatoid arthritis ([place])   . Vitamin D deficiency    Hx: of    Past Surgical History:  Procedure Laterality Date  . AV FISTULA PLACEMENT Left 06/28/2013   Procedure: ARTERIOVENOUS (AV) FISTULA CREATION- Left arm with ultrasound guidance;  Surgeon: Rosetta Posner, MD;  Location: [facility] OR;  Service: Vascular;  Laterality: Left;  . COLONOSCOPY     hx: of  . HERNIA REPAIR    . INSERTION OF  DIALYSIS CATHETER N/A 06/28/2013   Procedure: INSERTION OF DIALYSIS CATHETER;  Surgeon: Rosetta Posner, MD;  Location: [facility] OR;  Service: Vascular;  Laterality: N/A;  . LIGATION OF COMPETING BRANCHES OF ARTERIOVENOUS FISTULA Left 10/12/2013   Procedure: LIGATION OF COMPETING BRANCHES OF ARTERIOVENOUS FISTULA;  Surgeon: Elam Dutch, MD;  Location: [facility] OR;  Service: Vascular;  Laterality: Left;  . neck surgery      Social history: reports that he quit smoking about 40 years ago. His smoking use included Cigarettes. He has a 5.00 pack-year smoking history. He has never used smokeless tobacco. He reports that he does not drink alcohol or use drugs.  Allergies  Allergen Reactions  . Glyburide Other (See Comments)    unknown  . Methotrexate Derivatives Other (See Comments)    Patient cannot remember    Family History  Problem Relation Age of Onset  . Cancer Mother   . Arthritis/Rheumatoid Sister      Prior to Admission medications   Medication Sig Start Date End Date Taking? Authorizing Provider  acetaminophen (TYLENOL) 500 MG tablet Take 2 tablets (1,000 mg total) by mouth 3 (three) times daily as needed for pain. 02/07/12  Yes Zenia Resides, MD  aspirin 81 MG EC tablet Take 1 tablet (81 mg total) by mouth daily. Swallow whole. 02/07/12  Yes Zenia Resides, MD  atorvastatin (LIPITOR) 40 MG tablet Take 1 tablet by mouth daily. 08/13/16  Yes Historical Provider, MD  cholecalciferol (VITAMIN D) 1000 units tablet Take 1,000 Units by mouth daily.   Yes Historical  Provider, MD  cloNIDine (CATAPRES) 0.2 MG tablet Take 0.2 mg by mouth daily.   Yes Historical Provider, MD  dicyclomine (BENTYL) 20 MG tablet Take 1 tablet (20 mg total) by mouth 2 (two) times daily as needed for spasms. 09/03/16  Yes Merryl Hacker, MD  diltiazem (DILACOR XR) 180 MG 24 hr capsule Take 1 capsule (180 mg total) by mouth daily. 08/05/13  Yes Waldemar Dickens, MD  furosemide (LASIX) 80 MG tablet Take 80 mg by mouth daily.     Yes Historical Provider, MD  insulin aspart (NOVOLOG FLEXPEN) 100 UNIT/ML FlexPen Inject 7.5 Units into the skin 3 (three) times daily with meals. Patient taking differently: Inject 18 Units into the skin 3 (three) times daily with meals.  01/31/14  Yes Waldemar Dickens, MD  Insulin Glargine (LANTUS) 100 UNIT/ML Solostar Pen Inject 20 Units into the skin every morning. Increase 5 u if am CBG is greater than 200.  Decrease 5 u if am CBG is less than 150 Patient taking differently: Inject 40 Units into the skin every morning. Increase 5 u if am CBG is greater than 200.  Decrease 5 u if am CBG is less than 150 09/05/14  Yes Aquilla Hacker, MD  loperamide (IMODIUM) 2 MG capsule Take 1 capsule (2 mg total) by mouth 4 (four) times daily as needed for diarrhea or loose stools. 09/03/16  Yes Merryl Hacker, MD  Omega-3 Fatty Acids (FISH OIL PO) Take 1 capsule by mouth daily.   Yes Historical Provider, MD  ondansetron (ZOFRAN ODT) 4 MG disintegrating tablet Take 1 tablet (4 mg total) by mouth every 8 (eight) hours as needed for nausea or vomiting. 09/03/16  Yes Merryl Hacker, MD  sildenafil (VIAGRA) 50 MG tablet Take 0.5-1 tablets (25-50 mg total) by mouth daily as needed for erectile dysfunction. 02/01/14  Yes Waldemar Dickens, MD  TRULICITY 1.5 XB/9.3JQ SOPN Inject 1 Units as directed once a week. Patient isn't sure how many units he takes. Patient takes on Wednesday's. 08/13/16  Yes Historical Provider, MD  Insulin Pen Needle (PEN NEEDLES) 31G X 6 MM MISC Use 1 needle per injection as directed 07/01/13   Patrecia Pour, MD  UNIFINE PENTIPS 31G X 5 MM MISC USE AS DIRECTED WITH INSULIN    Waldemar Dickens, MD    Physical Exam: Vitals:   09/05/16 0830 09/05/16 0930 09/05/16 1000 09/05/16 1005  BP: 160/66 145/63 134/66   Pulse: (!) 123 117 114   Resp: (!) 39 (!) 28 (!) 32   Temp:    98.9 F (37.2 C)  TempSrc:    Oral  SpO2: 93% 93% 93%   Weight:      Height:          Constitutional: NAD, calm,  comfortable Vitals:   09/05/16 0830 09/05/16 0930 09/05/16 1000 09/05/16 1005  BP: 160/66 145/63 134/66   Pulse: (!) 123 117 114   Resp: (!) 39 (!) 28 (!) 32   Temp:    98.9 F (37.2 C)  TempSrc:    Oral  SpO2: 93% 93% 93%   Weight:      Height:       Eyes: PERRL, lids and conjunctivae normal ENMT: Mucous membranes are moist.  Neck: normal, supple, no masses, no thyromegaly Respiratory: Right basal crackles, no wheezing. Normal respiratory effort. No accessory muscle use.  Cardiovascular: Regular rate and rhythm, no murmurs / rubs / gallops. No extremity edema. 2+ pedal pulses.  Abdomen: no tenderness, soft, mild distention.Bowel sounds positive.  Musculoskeletal: no clubbing / cyanosis. Good ROM, no contractures. Normal muscle tone.  Skin: no rashes, lesions, ulcers. No induration Neurologic: CN 2-12 grossly intact.  Strength 5/5 in all 4.  Psychiatric: Normal judgment and insight. Alert and oriented x 3. Normal mood.    Labs on Admission: I have personally reviewed following labs and imaging studies  CBC:  Recent Labs Lab 09/02/16 2200 09/05/16 0721  WBC 12.9* 12.5*  NEUTROABS 11.2* 11.4*  HGB 13.0 13.5  HCT 39.7 40.8  MCV 83.1 84.1  PLT 220 637*   Basic Metabolic Panel:  Recent Labs Lab 09/02/16 2200 09/05/16 0721  NA 138 135  K 3.4* 3.2*  CL 102 98*  CO2 26 23  GLUCOSE 194* 234*  BUN 16 26*  CREATININE 1.25* 2.57*  CALCIUM 8.8* 8.3*   GFR: Estimated Creatinine Clearance: 35.5 mL/min (by C-G formula based on SCr of 2.57 mg/dL (H)). Liver Function Tests:  Recent Labs Lab 09/02/16 2200 09/05/16 0721  AST 16 23  ALT 17 21  ALKPHOS 81 86  BILITOT 0.3 2.4*  PROT 7.5 7.4  ALBUMIN 3.6 3.0*    Recent Labs Lab 09/02/16 2200 09/05/16 0721  LIPASE 35 22   No results for input(s): AMMONIA in the last 168 hours. Coagulation Profile: No results for input(s): INR, PROTIME in the last 168 hours. Cardiac Enzymes:  Recent Labs Lab 09/05/16 0721    TROPONINI 0.06*   BNP (last 3 results) No results for input(s): PROBNP in the last 8760 hours. HbA1C: No results for input(s): HGBA1C in the last 72 hours. CBG: No results for input(s): GLUCAP in the last 168 hours. Lipid Profile: No results for input(s): CHOL, HDL, LDLCALC, TRIG, CHOLHDL, LDLDIRECT in the last 72 hours. Thyroid Function Tests: No results for input(s): TSH, T4TOTAL, FREET4, T3FREE, THYROIDAB in the last 72 hours. Anemia Panel: No results for input(s): VITAMINB12, FOLATE, FERRITIN, TIBC, IRON, RETICCTPCT in the last 72 hours. Urine analysis:    Component Value Date/Time   COLORURINE STRAW (A) 09/03/2016 0036   APPEARANCEUR CLEAR 09/03/2016 0036   LABSPEC 1.014 09/03/2016 0036   PHURINE 7.0 09/03/2016 0036   GLUCOSEU >=500 (A) 09/03/2016 0036   HGBUR NEGATIVE 09/03/2016 0036   BILIRUBINUR NEGATIVE 09/03/2016 0036   KETONESUR 20 (A) 09/03/2016 0036   PROTEINUR 100 (A) 09/03/2016 0036   UROBILINOGEN 0.2 06/18/2013 1311   NITRITE NEGATIVE 09/03/2016 0036   LEUKOCYTESUR NEGATIVE 09/03/2016 0036   Sepsis Labs: !!!!!!!!!!!!!!!!!!!!!!!!!!!!!!!!!!!!!!!!!!!! '@LABRCNTIP'$ (procalcitonin:4,lacticidven:4) ) Recent Results (from the past 240 hour(s))  Urine culture     Status: Abnormal   Collection Time: 09/03/16 12:36 AM  Result Value Ref Range Status   Specimen Description URINE, CLEAN CATCH  Final   Special Requests NONE  Final   Culture (A)  Final    <10,000 COLONIES/mL INSIGNIFICANT GROWTH Performed at [facility]    Report Status 09/04/2016 FINAL  Final  Blood Culture (routine x 2)     Status: None (Preliminary result)   Collection Time: 09/05/16  7:21 AM  Result Value Ref Range Status   Specimen Description BLOOD RIGHT FOREARM  Final   Special Requests BOTTLES DRAWN AEROBIC AND ANAEROBIC 6CC  Final   Culture PENDING  Incomplete   Report Status PENDING  Incomplete  Blood Culture (routine x 2)     Status: None (Preliminary result)   Collection Time:  09/05/16  7:25 AM  Result Value Ref Range Status   Specimen Description BLOOD  RIGHT HAND  Final   Special Requests BOTTLES DRAWN AEROBIC AND ANAEROBIC 8CC  Final   Culture PENDING  Incomplete   Report Status PENDING  Incomplete     Radiological Exams on Admission: Dg Chest 2 View  Result Date: 09/05/2016 CLINICAL DATA:  66 year old male with right upper quadrant pain, shortness of breath and generalized weakness for the past 4 days EXAM: CHEST  2 VIEW COMPARISON:  Prior chest x-ray 10/12/2013 FINDINGS: Interval removal of right IJ approach tunneled hemodialysis catheter. New patchy right lower lobe airspace opacity predominantly in the lower lobe. Additionally, there is atelectasis in the right middle lobe. Suspect a small layering pleural effusion as well. Stable borderline cardiomegaly. Probable trace chronic left pleural effusion. Mild vascular congestion without overt edema. No acute osseous abnormality. IMPRESSION: 1. New patchy airspace opacity in the right lower lobe concerning for pneumonia. Followup PA and lateral chest X-ray is recommended in 3-4 weeks following trial of antibiotic therapy to ensure resolution and exclude underlying malignancy. 2. Right middle lobe atelectasis. 3. Small right pleural effusion is likely parapneumonic. 4. Stable cardiomegaly and mild vascular congestion without overt edema. 5. Similar appearance of chronic small left pleural effusion. Electronically Signed   By: Jacqulynn Cadet M.D.   On: 09/05/2016 08:27    EKG: Independently reviewed. Sinus tachycardia  Assessment/Plan Active Problems:   CAP (community acquired pneumonia)  #Probable sepsis due to commonly acquired pneumonia: Chest x-ray showed right lower lobe pneumonia. Patient with tachycardia, leukocytosis, fever and abnormal x-ray finding. -Cultures were sent in the ER and treated with antibiotics. -Continue ceftriaxone and azithromycin -The sputum culture -We'll check influenza and respiratory  virus, droplet precaution since patient also with sore throat -Monitor fever, leukocytosis. -Ordered nebulization treatment, Atrovent.  #Acute on chronic kidney disease likely hemodynamically mediated in the setting of Lasix and decreased oral intake: Hold Lasix. Continue gentle IV fluid. Monitor BMP. Check urinalysis.  #Elevated troponin likely in the setting of sepsis: Patient does not have chest pain. I will trend troponin. Admit in telemetry. Check echocardiogram for wall motion abnormalities.  #Hypokalemia: Replete potassium chloride. Monitor labs.  #Abdominal pain: I will check abdominal x-ray. Patient with constipation. MiraLAX daughter.  DVT prophylaxis: Heparin subcutaneous and SCDs. Code Status: Full code Family Communication: Patient's wife at bedside in the ER Disposition Plan: Likely discharge home in 2-3 days Consults called: None Admission status: Inpatient.   Dron Tanna Furry MD [facility] Pager [phone]  If 7PM-7AM, please contact night-coverage [email or web address] Password [email or web address]  09/05/2016, 11:16 AM

## 2016-09-06 ENCOUNTER — Inpatient Hospital Stay (HOSPITAL_COMMUNITY): Payer: PPO

## 2016-09-06 DIAGNOSIS — J189 Pneumonia, unspecified organism: Secondary | ICD-10-CM

## 2016-09-06 DIAGNOSIS — R7989 Other specified abnormal findings of blood chemistry: Secondary | ICD-10-CM

## 2016-09-06 LAB — BLOOD CULTURE ID PANEL (REFLEXED)
Acinetobacter baumannii: NOT DETECTED
CANDIDA GLABRATA: NOT DETECTED
CANDIDA TROPICALIS: NOT DETECTED
CARBAPENEM RESISTANCE: NOT DETECTED
Candida albicans: NOT DETECTED
Candida krusei: NOT DETECTED
Candida parapsilosis: NOT DETECTED
ENTEROCOCCUS SPECIES: NOT DETECTED
Enterobacter cloacae complex: NOT DETECTED
Enterobacteriaceae species: DETECTED — AB
Escherichia coli: DETECTED — AB
Haemophilus influenzae: NOT DETECTED
Klebsiella oxytoca: NOT DETECTED
Klebsiella pneumoniae: NOT DETECTED
LISTERIA MONOCYTOGENES: NOT DETECTED
Neisseria meningitidis: NOT DETECTED
PROTEUS SPECIES: NOT DETECTED
Pseudomonas aeruginosa: NOT DETECTED
SERRATIA MARCESCENS: NOT DETECTED
STAPHYLOCOCCUS AUREUS BCID: NOT DETECTED
STAPHYLOCOCCUS SPECIES: NOT DETECTED
STREPTOCOCCUS AGALACTIAE: NOT DETECTED
STREPTOCOCCUS PNEUMONIAE: NOT DETECTED
Streptococcus pyogenes: NOT DETECTED
Streptococcus species: NOT DETECTED

## 2016-09-06 LAB — CBC
HCT: 36.6 % — ABNORMAL LOW (ref 39.0–52.0)
Hemoglobin: 11.8 g/dL — ABNORMAL LOW (ref 13.0–17.0)
MCH: 27.3 pg (ref 26.0–34.0)
MCHC: 32.2 g/dL (ref 30.0–36.0)
MCV: 84.7 fL (ref 78.0–100.0)
PLATELETS: 127 10*3/uL — AB (ref 150–400)
RBC: 4.32 MIL/uL (ref 4.22–5.81)
RDW: 15 % (ref 11.5–15.5)
WBC: 10.8 10*3/uL — ABNORMAL HIGH (ref 4.0–10.5)

## 2016-09-06 LAB — BASIC METABOLIC PANEL
Anion gap: 9 (ref 5–15)
BUN: 29 mg/dL — AB (ref 6–20)
CALCIUM: 7.8 mg/dL — AB (ref 8.9–10.3)
CO2: 21 mmol/L — ABNORMAL LOW (ref 22–32)
CREATININE: 2.67 mg/dL — AB (ref 0.61–1.24)
Chloride: 109 mmol/L (ref 101–111)
GFR calc Af Amer: 27 mL/min — ABNORMAL LOW (ref >60)
GFR, EST NON AFRICAN AMERICAN: 23 mL/min — AB (ref >60)
GLUCOSE: 245 mg/dL — AB (ref 65–99)
Potassium: 4.5 mmol/L (ref 3.5–5.1)
Sodium: 139 mmol/L (ref 135–145)

## 2016-09-06 LAB — RESPIRATORY PANEL BY PCR
Adenovirus: NOT DETECTED
BORDETELLA PERTUSSIS-RVPCR: NOT DETECTED
Chlamydophila pneumoniae: NOT DETECTED
Coronavirus 229E: NOT DETECTED
Coronavirus HKU1: NOT DETECTED
Coronavirus NL63: NOT DETECTED
Coronavirus OC43: NOT DETECTED
Influenza A: NOT DETECTED
Influenza B: NOT DETECTED
METAPNEUMOVIRUS-RVPPCR: NOT DETECTED
Mycoplasma pneumoniae: NOT DETECTED
PARAINFLUENZA VIRUS 3-RVPPCR: NOT DETECTED
Parainfluenza Virus 1: NOT DETECTED
Parainfluenza Virus 2: NOT DETECTED
Parainfluenza Virus 4: NOT DETECTED
RESPIRATORY SYNCYTIAL VIRUS-RVPPCR: NOT DETECTED
RHINOVIRUS / ENTEROVIRUS - RVPPCR: NOT DETECTED

## 2016-09-06 LAB — GLUCOSE, CAPILLARY
GLUCOSE-CAPILLARY: 232 mg/dL — AB (ref 65–99)
GLUCOSE-CAPILLARY: 275 mg/dL — AB (ref 65–99)
Glucose-Capillary: 264 mg/dL — ABNORMAL HIGH (ref 65–99)
Glucose-Capillary: 267 mg/dL — ABNORMAL HIGH (ref 65–99)

## 2016-09-06 LAB — HIV ANTIBODY (ROUTINE TESTING W REFLEX): HIV Screen 4th Generation wRfx: NONREACTIVE

## 2016-09-06 LAB — ECHOCARDIOGRAM COMPLETE
HEIGHTINCHES: 73 in
WEIGHTICAEL: 3599.99 [oz_av]

## 2016-09-06 LAB — MAGNESIUM: MAGNESIUM: 1.7 mg/dL (ref 1.7–2.4)

## 2016-09-06 LAB — STREP PNEUMONIAE URINARY ANTIGEN: Strep Pneumo Urinary Antigen: NEGATIVE

## 2016-09-06 MED ORDER — LEVALBUTEROL HCL 0.63 MG/3ML IN NEBU
0.6300 mg | INHALATION_SOLUTION | Freq: Four times a day (QID) | RESPIRATORY_TRACT | Status: DC
  Administered 2016-09-06: 0.63 mg via RESPIRATORY_TRACT
  Filled 2016-09-06: qty 3

## 2016-09-06 MED ORDER — LEVALBUTEROL HCL 0.63 MG/3ML IN NEBU: 0.6300 mg | INHALATION_SOLUTION | Freq: Four times a day (QID) | RESPIRATORY_TRACT | Status: DC

## 2016-09-06 MED ORDER — LEVALBUTEROL HCL 0.63 MG/3ML IN NEBU
0.6300 mg | INHALATION_SOLUTION | Freq: Three times a day (TID) | RESPIRATORY_TRACT | Status: DC
  Administered 2016-09-06 (×3): 0.63 mg via RESPIRATORY_TRACT
  Filled 2016-09-06 (×3): qty 3

## 2016-09-06 MED ORDER — POTASSIUM CHLORIDE CRYS ER 20 MEQ PO TBCR
20.0000 meq | EXTENDED_RELEASE_TABLET | Freq: Every day | ORAL | Status: DC
  Administered 2016-09-07 – 2016-09-08 (×2): 20 meq via ORAL
  Filled 2016-09-06 (×3): qty 1

## 2016-09-06 MED ORDER — FUROSEMIDE 10 MG/ML IJ SOLN
40.0000 mg | Freq: Once | INTRAMUSCULAR | Status: AC
  Administered 2016-09-06: 40 mg via INTRAVENOUS
  Filled 2016-09-06: qty 4

## 2016-09-06 MED ORDER — BISACODYL 5 MG PO TBEC
5.0000 mg | DELAYED_RELEASE_TABLET | Freq: Every day | ORAL | Status: DC | PRN
  Administered 2016-09-07 – 2016-09-25 (×3): 5 mg via ORAL
  Filled 2016-09-06 (×3): qty 1

## 2016-09-06 MED ORDER — MORPHINE SULFATE (PF) 4 MG/ML IV SOLN
4.0000 mg | Freq: Once | INTRAVENOUS | Status: AC
  Administered 2016-09-06: 4 mg via INTRAVENOUS
  Filled 2016-09-06: qty 1

## 2016-09-06 NOTE — Care Management Important Message (Signed)
Important Message  Patient Details  Name: Gary Lang MRN: [id number] Date of Birth: 1951/05/03   Medicare Important Message Given:  Yes    Sherald Barge, RN 09/06/2016, 9:41 AM

## 2016-09-06 NOTE — Progress Notes (Signed)
Dr. Marin Comment notified of patients tachypnea and asked for PRN nebs. Dr. Marin Comment in to see patient, and will enter orders.

## 2016-09-06 NOTE — Progress Notes (Signed)
PHARMACY - PHYSICIAN COMMUNICATION CRITICAL VALUE ALERT - BLOOD CULTURE IDENTIFICATION (BCID)  Results for orders placed or performed during the hospital encounter of 09/05/16  Blood Culture ID Panel (Reflexed) (Collected: 09/05/2016  7:21 AM)  Result Value Ref Range   Enterococcus species NOT DETECTED NOT DETECTED   Listeria monocytogenes NOT DETECTED NOT DETECTED   Staphylococcus species NOT DETECTED NOT DETECTED   Staphylococcus aureus NOT DETECTED NOT DETECTED   Streptococcus species NOT DETECTED NOT DETECTED   Streptococcus agalactiae NOT DETECTED NOT DETECTED   Streptococcus pneumoniae NOT DETECTED NOT DETECTED   Streptococcus pyogenes NOT DETECTED NOT DETECTED   Acinetobacter baumannii NOT DETECTED NOT DETECTED   Enterobacteriaceae species DETECTED (A) NOT DETECTED   Enterobacter cloacae complex NOT DETECTED NOT DETECTED   Escherichia coli DETECTED (A) NOT DETECTED   Klebsiella oxytoca NOT DETECTED NOT DETECTED   Klebsiella pneumoniae NOT DETECTED NOT DETECTED   Proteus species NOT DETECTED NOT DETECTED   Serratia marcescens NOT DETECTED NOT DETECTED   Carbapenem resistance NOT DETECTED NOT DETECTED   Haemophilus influenzae NOT DETECTED NOT DETECTED   Neisseria meningitidis NOT DETECTED NOT DETECTED   Pseudomonas aeruginosa NOT DETECTED NOT DETECTED   Candida albicans NOT DETECTED NOT DETECTED   Candida glabrata NOT DETECTED NOT DETECTED   Candida krusei NOT DETECTED NOT DETECTED   Candida parapsilosis NOT DETECTED NOT DETECTED   Candida tropicalis NOT DETECTED NOT DETECTED    Name of physician (or Provider) Contacted: Dr. Carolin Sicks  Assessment/Plan:   66 y.o. male admitted on 09/05/2016 with E.Coli bacteremia. Antibioticregimen changed from ceftriaxone to zosyn 1/4 with BCID notification. WBC trending down, temps trending down this AM. F/U cultures and sensitivities. Continue current regimen.  Isac Sarna, BS Pharm D, [place] Clinical Pharmacist Pager [phone] 09/06/2016   8:03 AM

## 2016-09-06 NOTE — Evaluation (Signed)
Physical Therapy Evaluation Patient Details Name: Gary Lang MRN: [id number] DOB: [birth year] Today's Date: 09/06/2016   History of Present Illness  Gary Lang is a 66 y.o. male with medical history significant of chronic kidney disease, diabetes, hypertension, rheumatoid arthritis, ex-smoker quit about 20 years ago presented with fever up to 102 at home associated with chills and right-sided chest pain. Patient visited a few days ago to ER with right-sided chest and abdominal pain when CT scan of abdomen and pelvis was unremarkable and patient was discharged home. Patient came with worsening symptoms. Denied sick contact or recent travel. Reported sore throat, shortness of breath however denied cough or chest pain. He reports the pain is mostly on right side of the abdomen, mostly presented with occasional worsening. Not associated with nausea vomiting or diarrhea. Last bowel movement was 3 days ago which was hard stool. Also reported generalized weakness and decreased oral intake.  Dx:  R LL PNA with acute on chronic kidney disease.    Clinical Impression  Pt received in bed, but was marginally agreeable to PT evaluation.  Pt expressed that he is normally independent with all functional mobility, as well as driving, and is a Hydrographic surveyor.  During today's PT evaluation, he required Mod A for supine<>sit, and Min A for sit<>stand.  He was able to ambulate 64f with RW and 3L of O2 with Min guard.  However, distance limited due to fatigue.  At this point, pt was strongly encouraged to increase his mobility with nursing staff.  He would benefit from HHPT RW and BSC upon returning home.     Follow Up Recommendations Home health PT;Supervision/Assistance - 24 hour    Equipment Recommendations  Rolling walker with 5" wheels;3in1 (PT)    Recommendations for Other Services       Precautions / Restrictions Precautions Precautions: Fall Precaution Comments: due to immobility.  Droplet  precautions Restrictions Weight Bearing Restrictions: No      Mobility  Bed Mobility Overal bed mobility: Needs Assistance Bed Mobility: Supine to Sit     Supine to sit: Mod assist;HOB elevated     General bed mobility comments: increased time with HOB completely raised.  Vc's for sequencing, and assistance to raise trunk up off the bed, and rotate hips around to the EOB.  Increased time to scoot to the EOB with vc's to lean fwd and push from the mattress.   Transfers Overall transfer level: Needs assistance Equipment used: Rolling walker (2 wheeled) Transfers: Sit to/from Stand Sit to Stand: Min guard         General transfer comment: Not tested-pt refused  Ambulation/Gait Ambulation/Gait assistance: Min guard Ambulation Distance (Feet): 10 Feet Assistive device: Rolling walker (2 wheeled) Gait Pattern/deviations: Step-through pattern;Trunk flexed     General Gait Details: Gait distance limited due to fatigue.  Stairs            Wheelchair Mobility    Modified Rankin (Stroke Patients Only)       Balance Overall balance assessment: Needs assistance Sitting-balance support: Feet supported;Bilateral upper extremity supported Sitting balance-Leahy Scale: Fair     Standing balance support: Bilateral upper extremity supported Standing balance-Leahy Scale: Poor                               Pertinent Vitals/Pain Pain Assessment: No/denies pain Pain Score: 6  Pain Location: R abdominal pain - but not currently in pain Pain Descriptors /  Indicators: Nagging Pain Intervention(s): Limited activity within patient's tolerance;Monitored during session;Repositioned;Premedicated before session    Home Living Family/patient expects to be discharged to:: Private residence Living Arrangements: Children;Spouse/significant other (son and dtr) Available Help at Discharge: Family;Available 24 hours/day Type of Home: House Home Access: Stairs to enter    [employer] of Steps: 2 Home Layout: One level Home Equipment: Bedside commode      Prior Function Level of Independence: Independent      ADL's / Homemaking Assistance Needed: independent, driving, and able to get to the grocery store.          Hand Dominance   Dominant Hand: Right    Extremity/Trunk Assessment   Upper Extremity Assessment Upper Extremity Assessment: Generalized weakness    Lower Extremity Assessment Lower Extremity Assessment: Generalized weakness       Communication   Communication: No difficulties  Cognition Arousal/Alertness: Awake/alert Behavior During Therapy: WFL for tasks assessed/performed Overall Cognitive Status: Within Functional Limits for tasks assessed                      General Comments      Exercises     Assessment/Plan    PT Assessment Patient needs continued PT services  PT Problem List Decreased strength;Decreased activity tolerance;Decreased balance;Decreased mobility;Cardiopulmonary status limiting activity;Obesity          PT Treatment Interventions DME instruction;Gait training;Functional mobility training;Therapeutic activities;Therapeutic exercise;Balance training;Patient/family education    PT Goals (Current goals can be found in the Care Plan section)  Acute Rehab PT Goals Patient Stated Goal: Pt wants to go home.  PT Goal Formulation: With patient Time For Goal Achievement: 09/13/16 Potential to Achieve Goals: Good    Frequency Min 3X/week   Barriers to discharge        Co-evaluation               End of Session Equipment Utilized During Treatment: Gait belt;Oxygen Activity Tolerance: Patient limited by fatigue Patient left: in chair;with call bell/phone within reach;with nursing/sitter in room Nurse Communication: Mobility status    Functional Assessment Tool Used: Bruce "6-clicks"  Functional Limitation: Mobility: Walking and moving  around Mobility: Walking and Moving Around Current Status [phone]): At least 20 percent but less than 40 percent impaired, limited or restricted Mobility: Walking and Moving Around Goal Status [phone]): At least 1 percent but less than 20 percent impaired, limited or restricted    Time: 1024-1055 PT Time Calculation (min) (ACUTE ONLY): 31 min   Charges:   PT Evaluation $PT Eval Low Complexity: 1 Procedure PT Treatments $Gait Training: 8-22 mins   PT G Codes:   PT G-Codes **NOT FOR INPATIENT CLASS** Functional Assessment Tool Used: [employer] "6-clicks"  Functional Limitation: Mobility: Walking and moving around Mobility: Walking and Moving Around Current Status [phone]): At least 20 percent but less than 40 percent impaired, limited or restricted Mobility: Walking and Moving Around Goal Status [phone]): At least 1 percent but less than 20 percent impaired, limited or restricted    Beth Brithney Bensen, PT, DPT X: [phone]

## 2016-09-06 NOTE — Evaluation (Signed)
Occupational Therapy Evaluation Patient Details Name: Gary Lang MRN: [id number] DOB: Feb 24, 1951 Today's Date: 09/06/2016    History of Present Illness Gary Lang is a 66 y.o. male with medical history significant of chronic kidney disease, diabetes, hypertension, rheumatoid arthritis, ex-smoker quit about 20 years ago presented with fever up to 102 at home associated with chills and right-sided chest pain. Patient visited a few days ago to ER with right-sided chest and abdominal pain when CT scan of abdomen and pelvis was unremarkable and patient was discharged home. Patient came with worsening symptoms. Denied sick contact or recent travel. Reported sore throat, shortness of breath however denied cough or chest pain. He reports the pain is mostly on right side of the abdomen, mostly presented with occasional worsening. Not associated with nausea vomiting or diarrhea. Last bowel movement was 3 days ago which was hard stool. Also reported generalized weakness and decreased oral intake.   Clinical Impression   Pt awake, alert, oriented x4 this am, marginally agreeable to OT evaluation. Pt reports independence in B/IADL completion and functional mobility PTA, drives. Pt limited in ADL completion this am due to right abdominal pain and SOB, BUE with generalized weakness; increased time required for tasks. Pt wife and children available to assist with ADL tasks as needed on discharge home. No further OT services required at this time.     Follow Up Recommendations  No OT follow up;Supervision/Assistance - 24 hour    Equipment Recommendations  None recommended by OT       Precautions / Restrictions Precautions Precautions: Fall Restrictions Weight Bearing Restrictions: No      Mobility Bed Mobility Overal bed mobility: Needs Assistance Bed Mobility: Supine to Sit     Supine to sit: Min assist     General bed mobility comments: Increased time due to pain  Transfers                  General transfer comment: Not tested-pt refused         ADL Overall ADL's : Needs assistance/impaired Eating/Feeding: Set up;Sitting                   Lower Body Dressing: Maximal assistance;Sitting/lateral leans Lower Body Dressing Details (indicate cue type and reason): Pt requiring assistance due to increased pain when attempting to bend over Toilet Transfer: Set up Toilet Transfer Details (indicate cue type and reason): Pt used urinal while sitting at EOB Toileting- Clothing Manipulation and Hygiene: Modified independent;Sitting/lateral lean               Vision Vision Assessment?: No apparent visual deficits          Pertinent Vitals/Pain Pain Assessment: 0-10 Pain Score: 6  Pain Location: right abdominal pain Pain Descriptors / Indicators: Aching Pain Intervention(s): Limited activity within patient's tolerance;Monitored during session;Repositioned;Premedicated before session     Hand Dominance Right   Extremity/Trunk Assessment Upper Extremity Assessment Upper Extremity Assessment: Generalized weakness   Lower Extremity Assessment Lower Extremity Assessment: Defer to PT evaluation       Communication Communication Communication: No difficulties   Cognition Arousal/Alertness: Awake/alert Behavior During Therapy: WFL for tasks assessed/performed Overall Cognitive Status: Within Functional Limits for tasks assessed                                Home Living Family/patient expects to be discharged to:: Private residence Living Arrangements: Spouse/significant other Available Help  at Discharge: Family;Available 24 hours/day Type of Home: House Home Access: Level entry           Bathroom Shower/Tub: Teacher, early years/pre: Standard     Home Equipment: Bedside commode          Prior Functioning/Environment Level of Independence: Independent                 OT Problem List: Pain     End of Session Nurse Communication: Other (comment) (pt at EOB eating)  Activity Tolerance: Patient limited by pain Patient left: in bed;with call bell/phone within reach   Time: 0811-0830 OT Time Calculation (min): 19 min Charges:  OT General Charges $OT Visit: 1 Procedure OT Evaluation $OT Eval Low Complexity: 1 Procedure Guadelupe Sabin, OTR/L  [phone] 09/06/2016, 8:38 AM

## 2016-09-06 NOTE — Care Management Note (Addendum)
Case Management Note  Patient Details  Name: Gary Lang MRN: [id number] Date of Birth: 1950/10/14  Subjective/Objective:                  Pt is from home, lives with his wife, admitted with CAP. Pts wife at bedside and reports pt is ind at baseline. He has no HH services or DME needs. He has PCP, drives to appointments and has no difficulty affording or managing medications. Wife states they have cane, walker, BSC at home to use if needed. PT has seen pt and recommends HH PT. Pt's wife unsure at this time if that will be "necessary". She has no preference to [facility] agency and would like to re-assess needs prior to DC.   Action/Plan: CM will cont to follow.   1/10 [place], BSN - patient with pna, acute cholecystitis, s/p percutaneous cholecystectomy, resp failure and aki, new onset afib.  Transferred to ICU for CRRT and intubation.   Per previous NCM note wife did not want [facility] services but NCM will check with patient again before dc to see if they have changed their mind about [facility] services.  NCM will cont to follow for dc needs.   Expected Discharge Date:     09/12/2015             Expected Discharge Plan:  Home/Self Care  In-House Referral:  NA  Discharge planning Services  CM Consult  Post Acute Care Choice:  Home Health Choice offered to:  Spouse  HH Arranged:  PT HH Agency:     Status of Service:  In process, will continue to follow  Sherald Barge, RN 09/06/2016, 12:55 PM

## 2016-09-06 NOTE — Progress Notes (Signed)
CTSP re tachypnea. Patient is not in any respiratory distress.  He is complained of right sided pain. No retrosternal pain and non pleuritic. His CXR showed PNA, parapneumonic effusion, and "vascular congestion" on admission. His Cr is elevated, and he had been receiving IVF. On exam, he is wheezing with slight basilar rales bilaterally. Will d/c IVF, give Lasix '40mg'$  x 1, Morphine '4mg'$  IV x 1, and Xopenex.   Will follow closely.  Orvan Falconer MD FACP. Hospitalist.

## 2016-09-06 NOTE — Progress Notes (Signed)
*  PRELIMINARY RESULTS* Echocardiogram 2D Echocardiogram has been performed.  Gary Lang 09/06/2016, 1:26 PM

## 2016-09-06 NOTE — Progress Notes (Signed)
Pt Respiratory Panel came back negative,  Droplet precautions discontinued at this time.  Spoke with Infection Prevention and blood culture results do not call for contact precautions at this time.

## 2016-09-06 NOTE — Progress Notes (Signed)
PROGRESS NOTE    Gary Lang  YBW:[id number] DOB: Oct 21, 1950 DOA: 09/05/2016 PCP: [name], MD   Brief Narrative: 66 y.o. male with medical history significant of chronic kidney disease, diabetes, hypertension, rheumatoid arthritis, ex-smoker quit about 20 years ago presented with fever up to 102 at home associated with chills and right-sided chest pain.  Assessment & Plan:   Active Problems:   CAP (community acquired pneumonia)  #Sepsis due to E Coli bacteremia and possibly commonly acquired pneumonia: Chest x-ray showed right lower lobe pneumonia. Patient with tachycardia, leukocytosis, fever and abnormal x-ray finding on admission -2 out of 2 blood cultures growing gram-negative rods consistent with Escherichia coli. Follow up final culture and sensitivity. Planning to repeat blood cultures tomorrow. The antibiotic changed to Zosyn. Continue azithromycin to cover for pneumonia as well. Recent CT scan of abdomen and pelvis with no acute finding. Exactly unknown the source of Escherichia coli bacteremia. -Respiratory viral studies negative. Discontinue droplet precaution. -Leukocytosis improving. Monitor fever curve -Continue nebulization treatment.  #Acute on chronic kidney disease likely hemodynamically mediated in the setting of Lasix and decreased oral intake: Continue to hold Lasix. Serum creatinine level is stable. Monitor BMP. Avoid nephrotoxins.   #Elevated troponin likely in the setting of sepsis: Patient does not have chest pain. Continue telemetry monitoring. Follow-up echocardiogram for wall motion abnormalities.  #Hypokalemia: Improved  #Abdominal pain: Abnormal x-ray showed no acute finding. Recent CT was unremarkable. Patient reported no bowel movement for last 3-4 days. On MiraLAX. We will add Dulcolax. Continue to monitor supportively.  DVT prophylaxis: Heparin subcutaneous Code Status: Full code Family Communication: No family present at bedside Disposition  Plan: Currently patient. Likely discharge home in 2-3 days.    Consultants:   None  Procedures: None Antimicrobials: Azithromycin and Zosyn.  Subjective: Patient was seen and examined at bedside. Reported feeling better. Denied chest pain, shortness of breath, fever or chills nausea vomiting. Reported no bowel movement for last few days.   Objective: Vitals:   09/06/16 0023 09/06/16 0515 09/06/16 0728 09/06/16 1454  BP: (!) 142/69     Pulse: (!) 114 (!) 117    Resp: (!) 22 (!) 46    Temp: 100 F (37.8 C) 99.7 F (37.6 C)    TempSrc: Oral Oral    SpO2: 93% 93% 91% 92%  Weight:      Height:        Intake/Output Summary (Last 24 hours) at 09/06/16 1517 Last data filed at 09/06/16 0900  Gross per 24 hour  Intake           961.25 ml  Output              525 ml  Net           436.25 ml   Filed Weights   09/05/16 0707 09/05/16 0729 09/05/16 1442  Weight: 100.2 kg (221 lb) 102.1 kg (225 lb) 102.1 kg (225 lb)    Examination:  General exam: Appears calm and comfortable  Respiratory system: Clear to auscultation. Respiratory effort normal. No wheezing or crackle Cardiovascular system: S1 & S2 heard, RRR.  No pedal edema. Gastrointestinal system: Abdomen is nondistended, soft and nontender. Normal bowel sounds heard. Central nervous system: Alert and oriented. No focal neurological deficits. Extremities: Symmetric 5 x 5 power. Skin: No rashes, lesions or ulcers Psychiatry: Judgement and insight appear normal. Mood & affect appropriate.     Data Reviewed: I have personally reviewed following labs and imaging studies  CBC:  Recent Labs Lab 09/02/16 2200 09/05/16 0721 09/06/16 0621  WBC 12.9* 12.5* 10.8*  NEUTROABS 11.2* 11.4*  --   HGB 13.0 13.5 11.8*  HCT 39.7 40.8 36.6*  MCV 83.1 84.1 84.7  PLT 220 137* 267*   Basic Metabolic Panel:  Recent Labs Lab 09/02/16 2200 09/05/16 0721 09/06/16 0621  NA 138 135 139  K 3.4* 3.2* 4.5  CL 102 98* 109  CO2 26 23  21*  GLUCOSE 194* 234* 245*  BUN 16 26* 29*  CREATININE 1.25* 2.57* 2.67*  CALCIUM 8.8* 8.3* 7.8*  MG  --   --  1.7   GFR: Estimated Creatinine Clearance: 34.2 mL/min (by C-G formula based on SCr of 2.67 mg/dL (H)). Liver Function Tests:  Recent Labs Lab 09/02/16 2200 09/05/16 0721  AST 16 23  ALT 17 21  ALKPHOS 81 86  BILITOT 0.3 2.4*  PROT 7.5 7.4  ALBUMIN 3.6 3.0*    Recent Labs Lab 09/02/16 2200 09/05/16 0721  LIPASE 35 22   No results for input(s): AMMONIA in the last 168 hours. Coagulation Profile: No results for input(s): INR, PROTIME in the last 168 hours. Cardiac Enzymes:  Recent Labs Lab 09/05/16 0721 09/05/16 1517 09/05/16 2023  TROPONINI 0.06* 0.05* 0.05*   BNP (last 3 results) No results for input(s): PROBNP in the last 8760 hours. HbA1C: No results for input(s): HGBA1C in the last 72 hours. CBG:  Recent Labs Lab 09/05/16 1649 09/05/16 2047 09/06/16 0740 09/06/16 1110  GLUCAP 228* 221* 232* 264*   Lipid Profile: No results for input(s): CHOL, HDL, LDLCALC, TRIG, CHOLHDL, LDLDIRECT in the last 72 hours. Thyroid Function Tests: No results for input(s): TSH, T4TOTAL, FREET4, T3FREE, THYROIDAB in the last 72 hours. Anemia Panel: No results for input(s): VITAMINB12, FOLATE, FERRITIN, TIBC, IRON, RETICCTPCT in the last 72 hours. Sepsis Labs:  Recent Labs Lab 09/02/16 2320 09/05/16 0738 09/05/16 1010  LATICACIDVEN 1.3 2.80* 0.99    Recent Results (from the past 240 hour(s))  Urine culture     Status: Abnormal   Collection Time: 09/03/16 12:36 AM  Result Value Ref Range Status   Specimen Description URINE, CLEAN CATCH  Final   Special Requests NONE  Final   Culture (A)  Final    <10,000 COLONIES/mL INSIGNIFICANT GROWTH Performed at [facility]    Report Status 09/04/2016 FINAL  Final  Blood Culture (routine x 2)     Status: None (Preliminary result)   Collection Time: 09/05/16  7:21 AM  Result Value Ref Range Status    Specimen Description BLOOD RIGHT FOREARM  Final   Special Requests BOTTLES DRAWN AEROBIC AND ANAEROBIC [place]  Final   Culture  Setup Time   Final    Performed at [place] Gram Stain Report Called to,Read Back By and Verified With: STONE,B AT 1930 ON 09/05/2016 BY ISLEY,B Organism ID to follow CRITICAL RESULT CALLED TO, READ BACK BY AND VERIFIED WITH: N LIGHTNER,RN '@[id number]'$  09/06/16 MKELLY,MLT Performed at [facility]    Culture NO GROWTH 1 DAY  Final   Report Status PENDING  Incomplete  Blood Culture ID Panel (Reflexed)     Status: Abnormal   Collection Time: 09/05/16  7:21 AM  Result Value Ref Range Status   Enterococcus species NOT DETECTED NOT DETECTED Final   Listeria monocytogenes NOT DETECTED NOT DETECTED Final   Staphylococcus species NOT DETECTED NOT DETECTED Final   Staphylococcus aureus NOT DETECTED NOT DETECTED Final   Streptococcus species  NOT DETECTED NOT DETECTED Final   Streptococcus agalactiae NOT DETECTED NOT DETECTED Final   Streptococcus pneumoniae NOT DETECTED NOT DETECTED Final   Streptococcus pyogenes NOT DETECTED NOT DETECTED Final   Acinetobacter baumannii NOT DETECTED NOT DETECTED Final   Enterobacteriaceae species DETECTED (A) NOT DETECTED Final    Comment: CRITICAL RESULT CALLED TO, READ BACK BY AND VERIFIED WITH: N LIGHTNER,RN '@0312'$  09/06/16 MKELLY,MLT    Enterobacter cloacae complex NOT DETECTED NOT DETECTED Final   Escherichia coli DETECTED (A) NOT DETECTED Final    Comment: CRITICAL RESULT CALLED TO, READ BACK BY AND VERIFIED WITH: N LIGHTNER,RN '@0312'$  09/06/16 MKELLY,MLT    Klebsiella oxytoca NOT DETECTED NOT DETECTED Final   Klebsiella pneumoniae NOT DETECTED NOT DETECTED Final   Proteus species NOT DETECTED NOT DETECTED Final   Serratia marcescens NOT DETECTED NOT DETECTED Final   Carbapenem resistance NOT DETECTED NOT DETECTED Final   Haemophilus influenzae NOT DETECTED NOT DETECTED Final   Neisseria meningitidis  NOT DETECTED NOT DETECTED Final   Pseudomonas aeruginosa NOT DETECTED NOT DETECTED Final   Candida albicans NOT DETECTED NOT DETECTED Final   Candida glabrata NOT DETECTED NOT DETECTED Final   Candida krusei NOT DETECTED NOT DETECTED Final   Candida parapsilosis NOT DETECTED NOT DETECTED Final   Candida tropicalis NOT DETECTED NOT DETECTED Final    Comment: Performed at [facility]  Blood Culture (routine x 2)     Status: None (Preliminary result)   Collection Time: 09/05/16  7:25 AM  Result Value Ref Range Status   Specimen Description BLOOD RIGHT HAND  Final   Special Requests BOTTLES DRAWN AEROBIC AND ANAEROBIC 8CC  Final   Culture  Setup Time   Final    Performed at [place] Gram Stain Report Called to,Read Back By and Verified With: STONE,B AT 1930 ON 09/05/2016 BY ISLEY,B    Culture NO GROWTH 1 DAY  Final   Report Status PENDING  Incomplete  Respiratory Panel by PCR     Status: None   Collection Time: 09/05/16  2:22 PM  Result Value Ref Range Status   Adenovirus NOT DETECTED NOT DETECTED Final   Coronavirus 229E NOT DETECTED NOT DETECTED Final   Coronavirus HKU1 NOT DETECTED NOT DETECTED Final   Coronavirus NL63 NOT DETECTED NOT DETECTED Final   Coronavirus OC43 NOT DETECTED NOT DETECTED Final   Metapneumovirus NOT DETECTED NOT DETECTED Final   Rhinovirus / Enterovirus NOT DETECTED NOT DETECTED Final   Influenza A NOT DETECTED NOT DETECTED Final   Influenza B NOT DETECTED NOT DETECTED Final   Parainfluenza Virus 1 NOT DETECTED NOT DETECTED Final   Parainfluenza Virus 2 NOT DETECTED NOT DETECTED Final   Parainfluenza Virus 3 NOT DETECTED NOT DETECTED Final   Parainfluenza Virus 4 NOT DETECTED NOT DETECTED Final   Respiratory Syncytial Virus NOT DETECTED NOT DETECTED Final   Bordetella pertussis NOT DETECTED NOT DETECTED Final   Chlamydophila pneumoniae NOT DETECTED NOT DETECTED Final   Mycoplasma pneumoniae NOT DETECTED NOT DETECTED  Final    Comment: Performed at [facility]         Radiology Studies: Dg Chest 2 View  Result Date: 09/05/2016 CLINICAL DATA:  65 year old male with right upper quadrant pain, shortness of breath and generalized weakness for the past 4 days EXAM: CHEST  2 VIEW COMPARISON:  Prior chest x-ray 10/12/2013 FINDINGS: Interval removal of right IJ approach tunneled hemodialysis catheter. New patchy right lower lobe airspace opacity predominantly in  the lower lobe. Additionally, there is atelectasis in the right middle lobe. Suspect a small layering pleural effusion as well. Stable borderline cardiomegaly. Probable trace chronic left pleural effusion. Mild vascular congestion without overt edema. No acute osseous abnormality. IMPRESSION: 1. New patchy airspace opacity in the right lower lobe concerning for pneumonia. Followup PA and lateral chest X-ray is recommended in 3-4 weeks following trial of antibiotic therapy to ensure resolution and exclude underlying malignancy. 2. Right middle lobe atelectasis. 3. Small right pleural effusion is likely parapneumonic. 4. Stable cardiomegaly and mild vascular congestion without overt edema. 5. Similar appearance of chronic small left pleural effusion. Electronically Signed   By: Jacqulynn Cadet M.D.   On: 09/05/2016 08:27   Dg Abd 2 Views  Result Date: 09/05/2016 CLINICAL DATA:  Abdominal distention EXAM: ABDOMEN - 2 VIEW COMPARISON:  CT abdomen and pelvis September 03, 2016 FINDINGS: Supine and upright images obtained. There is contrast in the colon. There is no bowel dilatation or air-fluid level suggesting bowel obstruction. No free air. There are phleboliths in the pelvis. IMPRESSION: Contrast in colon.  No bowel obstruction or free air evident. Electronically Signed   By: Lowella Grip III M.D.   On: 09/05/2016 11:47        Scheduled Meds: . aspirin EC  81 mg Oral Daily  . atorvastatin  40 mg Oral Daily  . azithromycin  500 mg Oral Q24H  .  cholecalciferol  1,000 Units Oral Daily  . cloNIDine  0.2 mg Oral Daily  . diltiazem  180 mg Oral Daily  . heparin  5,000 Units Subcutaneous Q8H  . insulin aspart  0-15 Units Subcutaneous TID WC  . insulin aspart  0-5 Units Subcutaneous QHS  . insulin aspart  4 Units Subcutaneous TID WC  . insulin glargine  40 Units Subcutaneous q morning - 10a  . levalbuterol  0.63 mg Nebulization TID  . piperacillin-tazobactam (ZOSYN)  IV  3.375 g Intravenous Q8H  . polyethylene glycol  17 g Oral Daily  . [START ON 09/07/2016] potassium chloride  20 mEq Oral Daily   Continuous Infusions:   LOS: 1 day    Rheanne Cortopassi Tanna Furry, MD [facility] Pager [phone]  If 7PM-7AM, please contact night-coverage [email or web address] Password [email or web address] 09/06/2016, 3:17 PM

## 2016-09-07 ENCOUNTER — Inpatient Hospital Stay (HOSPITAL_COMMUNITY): Payer: PPO

## 2016-09-07 DIAGNOSIS — J9601 Acute respiratory failure with hypoxia: Secondary | ICD-10-CM

## 2016-09-07 LAB — CBC
HCT: 35.3 % — ABNORMAL LOW (ref 39.0–52.0)
Hemoglobin: 11.4 g/dL — ABNORMAL LOW (ref 13.0–17.0)
MCH: 27.1 pg (ref 26.0–34.0)
MCHC: 32.3 g/dL (ref 30.0–36.0)
MCV: 83.8 fL (ref 78.0–100.0)
PLATELETS: 130 10*3/uL — AB (ref 150–400)
RBC: 4.21 MIL/uL — AB (ref 4.22–5.81)
RDW: 15.5 % (ref 11.5–15.5)
WBC: 11.4 10*3/uL — AB (ref 4.0–10.5)

## 2016-09-07 LAB — BASIC METABOLIC PANEL
Anion gap: 9 (ref 5–15)
BUN: 39 mg/dL — ABNORMAL HIGH (ref 6–20)
CALCIUM: 8.2 mg/dL — AB (ref 8.9–10.3)
CO2: 24 mmol/L (ref 22–32)
Chloride: 103 mmol/L (ref 101–111)
Creatinine, Ser: 3.93 mg/dL — ABNORMAL HIGH (ref 0.61–1.24)
GFR calc non Af Amer: 15 mL/min — ABNORMAL LOW (ref >60)
GFR, EST AFRICAN AMERICAN: 17 mL/min — AB (ref >60)
Glucose, Bld: 300 mg/dL — ABNORMAL HIGH (ref 65–99)
Potassium: 3.9 mmol/L (ref 3.5–5.1)
SODIUM: 136 mmol/L (ref 135–145)

## 2016-09-07 LAB — COMPREHENSIVE METABOLIC PANEL
ALK PHOS: 107 U/L (ref 38–126)
ALT: 45 U/L (ref 17–63)
AST: 64 U/L — AB (ref 15–41)
Albumin: 2.1 g/dL — ABNORMAL LOW (ref 3.5–5.0)
Anion gap: 9 (ref 5–15)
BUN: 44 mg/dL — AB (ref 6–20)
CALCIUM: 8.2 mg/dL — AB (ref 8.9–10.3)
CO2: 24 mmol/L (ref 22–32)
CREATININE: 4.48 mg/dL — AB (ref 0.61–1.24)
Chloride: 105 mmol/L (ref 101–111)
GFR calc non Af Amer: 12 mL/min — ABNORMAL LOW (ref >60)
GFR, EST AFRICAN AMERICAN: 14 mL/min — AB (ref >60)
Glucose, Bld: 265 mg/dL — ABNORMAL HIGH (ref 65–99)
Potassium: 4.1 mmol/L (ref 3.5–5.1)
SODIUM: 138 mmol/L (ref 135–145)
Total Bilirubin: 2.2 mg/dL — ABNORMAL HIGH (ref 0.3–1.2)
Total Protein: 6.3 g/dL — ABNORMAL LOW (ref 6.5–8.1)

## 2016-09-07 LAB — GLUCOSE, CAPILLARY
Glucose-Capillary: 276 mg/dL — ABNORMAL HIGH (ref 65–99)
Glucose-Capillary: 308 mg/dL — ABNORMAL HIGH (ref 65–99)
Glucose-Capillary: 247 mg/dL — ABNORMAL HIGH (ref 65–99)
Glucose-Capillary: 150 mg/dL — ABNORMAL HIGH (ref 65–99)

## 2016-09-07 LAB — URINE CULTURE

## 2016-09-07 LAB — LIPASE, BLOOD: Lipase: 77 U/L — ABNORMAL HIGH (ref 11–51)

## 2016-09-07 LAB — BRAIN NATRIURETIC PEPTIDE: B Natriuretic Peptide: 101 pg/mL — ABNORMAL HIGH (ref 0.0–100.0)

## 2016-09-07 LAB — MAGNESIUM: Magnesium: 2.1 mg/dL (ref 1.7–2.4)

## 2016-09-07 MED ORDER — IPRATROPIUM BROMIDE 0.02 % IN SOLN
0.5000 mg | RESPIRATORY_TRACT | Status: DC
  Administered 2016-09-07 – 2016-09-08 (×7): 0.5 mg via RESPIRATORY_TRACT
  Filled 2016-09-07 (×6): qty 2.5

## 2016-09-07 MED ORDER — IPRATROPIUM BROMIDE 0.02 % IN SOLN
RESPIRATORY_TRACT | Status: AC
  Filled 2016-09-07: qty 2.5

## 2016-09-07 MED ORDER — HYDROMORPHONE HCL 1 MG/ML IJ SOLN
INTRAMUSCULAR | Status: AC
  Administered 2016-09-07: 1 mg via INTRAVENOUS
  Filled 2016-09-07: qty 1

## 2016-09-07 MED ORDER — HYDROMORPHONE HCL 1 MG/ML IJ SOLN
1.0000 mg | INTRAMUSCULAR | Status: DC | PRN
  Administered 2016-09-07: 1 mg via INTRAVENOUS

## 2016-09-07 MED ORDER — SODIUM CHLORIDE 0.9 % IV SOLN
INTRAVENOUS | Status: AC
  Administered 2016-09-07: 12:00:00 via INTRAVENOUS

## 2016-09-07 MED ORDER — FLEET ENEMA 7-19 GM/118ML RE ENEM
1.0000 | ENEMA | Freq: Every day | RECTAL | Status: DC | PRN
  Administered 2016-09-08: 1 via RECTAL
  Filled 2016-09-07: qty 1

## 2016-09-07 MED ORDER — METOPROLOL TARTRATE 5 MG/5ML IV SOLN
5.0000 mg | Freq: Once | INTRAVENOUS | Status: AC
  Administered 2016-09-07: 5 mg via INTRAVENOUS

## 2016-09-07 MED ORDER — HYDROMORPHONE HCL 1 MG/ML IJ SOLN
0.5000 mg | INTRAMUSCULAR | Status: DC | PRN
  Administered 2016-09-08 – 2016-09-10 (×5): 0.5 mg via INTRAVENOUS
  Filled 2016-09-07 (×6): qty 1

## 2016-09-07 MED ORDER — MORPHINE SULFATE (PF) 2 MG/ML IV SOLN: 1.0000 mg | Freq: Once | INTRAVENOUS | Status: DC

## 2016-09-07 MED ORDER — LEVALBUTEROL HCL 0.63 MG/3ML IN NEBU
0.6300 mg | INHALATION_SOLUTION | RESPIRATORY_TRACT | Status: DC
  Administered 2016-09-07 – 2016-09-08 (×6): 0.63 mg via RESPIRATORY_TRACT
  Filled 2016-09-07 (×6): qty 3

## 2016-09-07 MED ORDER — PANTOPRAZOLE SODIUM 40 MG IV SOLR
40.0000 mg | INTRAVENOUS | Status: DC
  Administered 2016-09-07 – 2016-09-09 (×3): 40 mg via INTRAVENOUS
  Filled 2016-09-07 (×3): qty 40

## 2016-09-07 NOTE — Progress Notes (Signed)
Pt HR is sustaining in 160s, VSS.  Dr Carolin Sicks notified via Shea Evans.  Verbal order for Metoprolol '5MG'$  IV once given.  Will continue to monitor.

## 2016-09-07 NOTE — Progress Notes (Signed)
Per pt request, called wife to notify of plans to transfer pt to stepdown unit.  Pt will transfer once bed available.

## 2016-09-07 NOTE — Progress Notes (Signed)
Called to give Tim, RN report at 2030.  Ericka Pontiff, RN 9:47 PM 09/07/16

## 2016-09-07 NOTE — Progress Notes (Signed)
Notified MD Carolin Sicks of pt c/o 8/10 RUQ abdominal pain will follow through with new orders.

## 2016-09-07 NOTE — Progress Notes (Addendum)
Called general surgery at [facility] and spoke with Dr. Georgette Dover regarding the transfer. I discussed the case with him. Pt will be admitted in hospitalist service and need to inform surgeon when patient arrives at [facility].  Also discussed with Dr. Hal Hope from [facility].

## 2016-09-07 NOTE — Progress Notes (Signed)
PROGRESS NOTE    Gary Lang  TIW:[id number] DOB: 1951-07-22 DOA: 09/05/2016 PCP: [name], MD   Brief Narrative: 66 y.o. male with medical history significant of chronic kidney disease, diabetes, hypertension, rheumatoid arthritis, ex-smoker quit about 20 years ago presented with fever up to 102 at home associated with chills and right-sided chest pain.  Assessment & Plan:   Active Problems:   CAP (community acquired pneumonia)  #Sepsis due to E Coli bacteremia and possibly commonly acquired pneumonia: Chest x-ray showed right lower lobe pneumonia. Patient with tachycardia, leukocytosis, fever and abnormal x-ray finding on admission -2 out of 2 blood cultures growing gram-negative rods consistent with Escherichia coli. Follow up final culture and sensitivity. -We will repeat 2 sets of blood culture today. Continue azithromycin and Zosyn.  -Recent CT scan of abdomen and pelvis with no acute finding. Exactly unknown the source of Escherichia coli bacteremia. -Respiratory viral studies negative. -Monitor leukocytosis, currently afebrile. -Continue nebulization treatment.  #Acute hypoxic respiratory failure likely in the setting of pneumonia and sepsis: Patient with higher requirement of oxygen last night. Currently on 6 L. Reported worsening shortness of breath. Repeat chest x-ray with no acute change. Continue treatment for pneumonia. Continue to monitor. I will check CT scan of chest without contrast. I will transfer patient to a stepdown for close monitoring.  #Acute on chronic kidney disease likely hemodynamically mediated in the setting of Lasix and decreased oral intake and sepsis: Continue to hold Lasix. -Serum creatinine level worsened today. BMP not elevated. Patient looks dry on physical exam. I will restart gentle IV hydration. Monitor BMP. Insert Foley catheter to monitor urine output. Avoid nephrotoxins. -Patient required hemodialysis in the past and he has left upper  extremity AV fistula. I will continue to monitor closely.  #Elevated troponin likely in the setting of sepsis: Patient does not have chest pain. Continue telemetry monitoring.  -Echocardiogram showed vigorous left ventricular systolic function with EF of 65-30%. There were no regional wall motion abnormalities. Also with  grade 1 diastolic dysfunction. -Patient had tachycardia with heart rate of 160s this morning. Improved with metoprolol 5 IV. Continue diltiazem and clonidine. Does not have chest pain.  #Hypokalemia: Improved. Monitor BMP.  #Abdominal pain likely due to constipation: Abnormal x-ray showed no acute finding. Recent CT was unremarkable. Patient reported no bowel movement for last 4-5 days. Still no bowel movement. Continue MiraLAX and Dulcolax. Ordered an enema today. Discussed with the patient's nurse.  DVT prophylaxis: Heparin subcutaneous Code Status: Full code Family Communication: No family present at bedside Disposition Plan: Will transfer patient to a stepdown unit.    Consultants:   None  Procedures: None Antimicrobials: Azithromycin and Zosyn.  Subjective: Patient was seen and examined at bedside. Reported shortness of breath and tired. Denied headache, dizziness, chest pain, nausea vomiting. Reports good urine output.   Objective: Vitals:   09/07/16 0445 09/07/16 0551 09/07/16 0808 09/07/16 0846  BP:  132/62  127/67  Pulse:  (!) 103  (!) 162  Resp:  18  20  Temp:  98.3 F (36.8 C)    TempSrc:  Oral    SpO2: 94% 94% 95% 97%  Weight:      Height:        Intake/Output Summary (Last 24 hours) at 09/07/16 1119 Last data filed at 09/07/16 0900  Gross per 24 hour  Intake              770 ml  Output  350 ml  Net              420 ml   Filed Weights   09/05/16 0707 09/05/16 0729 09/05/16 1442  Weight: 100.2 kg (221 lb) 102.1 kg (225 lb) 102.1 kg (225 lb)    Examination:  General exam: Ill-looking male, lying in bed, not in  distress Respiratory system: Clear bilaterally except mild intermittent wheeze, speaking full sentences. Cardiovascular system: S1 and S2 normal. Regular rate rhythm. No pedal edema. Gastrointestinal system: Abdomen soft, nontender, nondistended. Bowel sound positive.  Central nervous system: Alert and oriented. No focal neurological deficits. Extremities: Symmetric 5 x 5 power. Skin: No rashes, lesions or ulcers Psychiatry: Judgement and insight appear normal. Mood & affect appropriate.     Data Reviewed: I have personally reviewed following labs and imaging studies  CBC:  Recent Labs Lab 09/02/16 2200 09/05/16 0721 09/06/16 0621 09/07/16 0719  WBC 12.9* 12.5* 10.8* 11.4*  NEUTROABS 11.2* 11.4*  --   --   HGB 13.0 13.5 11.8* 11.4*  HCT 39.7 40.8 36.6* 35.3*  MCV 83.1 84.1 84.7 83.8  PLT 220 137* 127* 742*   Basic Metabolic Panel:  Recent Labs Lab 09/02/16 2200 09/05/16 0721 09/06/16 0621 09/07/16 0719  NA 138 135 139 136  K 3.4* 3.2* 4.5 3.9  CL 102 98* 109 103  CO2 26 23 21* 24  GLUCOSE 194* 234* 245* 300*  BUN 16 26* 29* 39*  CREATININE 1.25* 2.57* 2.67* 3.93*  CALCIUM 8.8* 8.3* 7.8* 8.2*  MG  --   --  1.7  --    GFR: Estimated Creatinine Clearance: 23.2 mL/min (by C-G formula based on SCr of 3.93 mg/dL (H)). Liver Function Tests:  Recent Labs Lab 09/02/16 2200 09/05/16 0721  AST 16 23  ALT 17 21  ALKPHOS 81 86  BILITOT 0.3 2.4*  PROT 7.5 7.4  ALBUMIN 3.6 3.0*    Recent Labs Lab 09/02/16 2200 09/05/16 0721  LIPASE 35 22   No results for input(s): AMMONIA in the last 168 hours. Coagulation Profile: No results for input(s): INR, PROTIME in the last 168 hours. Cardiac Enzymes:  Recent Labs Lab 09/05/16 0721 09/05/16 1517 09/05/16 2023  TROPONINI 0.06* 0.05* 0.05*   BNP (last 3 results) No results for input(s): PROBNP in the last 8760 hours. HbA1C: No results for input(s): HGBA1C in the last 72 hours. CBG:  Recent Labs Lab  09/06/16 0740 09/06/16 1110 09/06/16 1618 09/06/16 2100 09/07/16 0754  GLUCAP 232* 264* 275* 267* 276*   Lipid Profile: No results for input(s): CHOL, HDL, LDLCALC, TRIG, CHOLHDL, LDLDIRECT in the last 72 hours. Thyroid Function Tests: No results for input(s): TSH, T4TOTAL, FREET4, T3FREE, THYROIDAB in the last 72 hours. Anemia Panel: No results for input(s): VITAMINB12, FOLATE, FERRITIN, TIBC, IRON, RETICCTPCT in the last 72 hours. Sepsis Labs:  Recent Labs Lab 09/02/16 2320 09/05/16 0738 09/05/16 1010  LATICACIDVEN 1.3 2.80* 0.99    Recent Results (from the past 240 hour(s))  Urine culture     Status: Abnormal   Collection Time: 09/03/16 12:36 AM  Result Value Ref Range Status   Specimen Description URINE, CLEAN CATCH  Final   Special Requests NONE  Final   Culture (A)  Final    <10,000 COLONIES/mL INSIGNIFICANT GROWTH Performed at [facility]    Report Status 09/04/2016 FINAL  Final  Blood Culture (routine x 2)     Status: Abnormal (Preliminary result)   Collection Time: 09/05/16  7:21 AM  Result  Value Ref Range Status   Specimen Description BLOOD RIGHT FOREARM  Final   Special Requests BOTTLES DRAWN AEROBIC AND ANAEROBIC 6CC  Final   Culture  Setup Time   Final    Performed at [place] [facility] Report Called to,Read Back By and Verified With: STONE,B AT [place] ON 09/05/2016 BY ISLEY,B Organism ID to follow CRITICAL RESULT CALLED TO, READ BACK BY AND VERIFIED WITH: N LIGHTNER,RN '@[id number]'$  09/06/16 MKELLY,MLT    Culture (A)  Final    ESCHERICHIA COLI SUSCEPTIBILITIES TO FOLLOW Performed at [facility]    Report Status PENDING  Incomplete  Blood Culture ID Panel (Reflexed)     Status: Abnormal   Collection Time: 09/05/16  7:21 AM  Result Value Ref Range Status   Enterococcus species NOT DETECTED NOT DETECTED Final   Listeria monocytogenes NOT DETECTED NOT DETECTED Final   Staphylococcus species NOT DETECTED NOT  DETECTED Final   Staphylococcus aureus NOT DETECTED NOT DETECTED Final   Streptococcus species NOT DETECTED NOT DETECTED Final   Streptococcus agalactiae NOT DETECTED NOT DETECTED Final   Streptococcus pneumoniae NOT DETECTED NOT DETECTED Final   Streptococcus pyogenes NOT DETECTED NOT DETECTED Final   Acinetobacter baumannii NOT DETECTED NOT DETECTED Final   Enterobacteriaceae species DETECTED (A) NOT DETECTED Final    Comment: CRITICAL RESULT CALLED TO, READ BACK BY AND VERIFIED WITH: N LIGHTNER,RN '@[id number]'$  09/06/16 MKELLY,MLT    Enterobacter cloacae complex NOT DETECTED NOT DETECTED Final   Escherichia coli DETECTED (A) NOT DETECTED Final    Comment: CRITICAL RESULT CALLED TO, READ BACK BY AND VERIFIED WITH: N LIGHTNER,RN '@[id number]'$  09/06/16 MKELLY,MLT    Klebsiella oxytoca NOT DETECTED NOT DETECTED Final   Klebsiella pneumoniae NOT DETECTED NOT DETECTED Final   Proteus species NOT DETECTED NOT DETECTED Final   Serratia marcescens NOT DETECTED NOT DETECTED Final   Carbapenem resistance NOT DETECTED NOT DETECTED Final   Haemophilus influenzae NOT DETECTED NOT DETECTED Final   Neisseria meningitidis NOT DETECTED NOT DETECTED Final   Pseudomonas aeruginosa NOT DETECTED NOT DETECTED Final   Candida albicans NOT DETECTED NOT DETECTED Final   Candida glabrata NOT DETECTED NOT DETECTED Final   Candida krusei NOT DETECTED NOT DETECTED Final   Candida parapsilosis NOT DETECTED NOT DETECTED Final   Candida tropicalis NOT DETECTED NOT DETECTED Final    Comment: Performed at [facility]  Blood Culture (routine x 2)     Status: Abnormal (Preliminary result)   Collection Time: 09/05/16  7:25 AM  Result Value Ref Range Status   Specimen Description BLOOD RIGHT HAND  Final   Special Requests BOTTLES DRAWN AEROBIC AND ANAEROBIC 8CC  Final   Culture  Setup Time   Final    Performed at [place] [facility] Report Called to,Read Back By and Verified With: STONE,B AT  1930 ON 09/05/2016 BY ISLEY,B    Culture ESCHERICHIA COLI (A)  Final   Report Status PENDING  Incomplete  Urine culture     Status: Abnormal   Collection Time: 09/05/16  1:10 PM  Result Value Ref Range Status   Specimen Description URINE, RANDOM  Final   Special Requests NONE  Final   Culture MULTIPLE SPECIES PRESENT, SUGGEST RECOLLECTION (A)  Final   Report Status 09/07/2016 FINAL  Final  Respiratory Panel by PCR     Status: None   Collection Time: 09/05/16  2:22 PM  Result Value Ref Range Status   Adenovirus NOT  DETECTED NOT DETECTED Final   Coronavirus 229E NOT DETECTED NOT DETECTED Final   Coronavirus HKU1 NOT DETECTED NOT DETECTED Final   Coronavirus NL63 NOT DETECTED NOT DETECTED Final   Coronavirus OC43 NOT DETECTED NOT DETECTED Final   Metapneumovirus NOT DETECTED NOT DETECTED Final   Rhinovirus / Enterovirus NOT DETECTED NOT DETECTED Final   Influenza A NOT DETECTED NOT DETECTED Final   Influenza B NOT DETECTED NOT DETECTED Final   Parainfluenza Virus 1 NOT DETECTED NOT DETECTED Final   Parainfluenza Virus 2 NOT DETECTED NOT DETECTED Final   Parainfluenza Virus 3 NOT DETECTED NOT DETECTED Final   Parainfluenza Virus 4 NOT DETECTED NOT DETECTED Final   Respiratory Syncytial Virus NOT DETECTED NOT DETECTED Final   Bordetella pertussis NOT DETECTED NOT DETECTED Final   Chlamydophila pneumoniae NOT DETECTED NOT DETECTED Final   Mycoplasma pneumoniae NOT DETECTED NOT DETECTED Final    Comment: Performed at [facility]         Radiology Studies: Dg Chest Port 1 View  Result Date: 09/07/2016 CLINICAL DATA:  Fever, hypoxia.  Subsequent encounter. EXAM: PORTABLE CHEST 1 VIEW COMPARISON:  09/05/2016 FINDINGS: There is opacity at the right lung base which is similar to the prior exam allowing for differences in patient positioning and technique. Mild linear scarring or atelectasis in the left mid lung, stable. Remainder of the lungs is clear. Elevation right hemidiaphragm  is stable. No obvious pleural effusion. No pneumothorax. IMPRESSION: 1. Right base opacity is similar to the prior exam. Although this may be atelectasis only, pneumonia should be considered likely given the symptoms of fever and hypoxia. 2. No significant change from the prior exam allowing for differences in positioning and technique. Electronically Signed   By: Lajean Manes M.D.   On: 09/07/2016 09:18   Dg Abd 2 Views  Result Date: 09/05/2016 CLINICAL DATA:  Abdominal distention EXAM: ABDOMEN - 2 VIEW COMPARISON:  CT abdomen and pelvis September 03, 2016 FINDINGS: Supine and upright images obtained. There is contrast in the colon. There is no bowel dilatation or air-fluid level suggesting bowel obstruction. No free air. There are phleboliths in the pelvis. IMPRESSION: Contrast in colon.  No bowel obstruction or free air evident. Electronically Signed   By: Lowella Grip III M.D.   On: 09/05/2016 11:47        Scheduled Meds: . aspirin EC  81 mg Oral Daily  . atorvastatin  40 mg Oral Daily  . azithromycin  500 mg Oral Q24H  . cholecalciferol  1,000 Units Oral Daily  . cloNIDine  0.2 mg Oral Daily  . diltiazem  180 mg Oral Daily  . heparin  5,000 Units Subcutaneous Q8H  . insulin aspart  0-15 Units Subcutaneous TID WC  . insulin aspart  0-5 Units Subcutaneous QHS  . insulin aspart  4 Units Subcutaneous TID WC  . insulin glargine  40 Units Subcutaneous q morning - 10a  . ipratropium  0.5 mg Nebulization Q4H  . levalbuterol  0.63 mg Nebulization Q4H  . piperacillin-tazobactam (ZOSYN)  IV  3.375 g Intravenous Q8H  . polyethylene glycol  17 g Oral Daily  . potassium chloride  20 mEq Oral Daily   Continuous Infusions:   LOS: 2 days    Dron Tanna Furry, MD [facility] Pager [phone]  If 7PM-7AM, please contact night-coverage [email or web address] Password [email or web address] 09/07/2016, 11:19 AM

## 2016-09-07 NOTE — Progress Notes (Signed)
Pt HR is now in the 90s after one time dose of Metoprolol '5mg'$  IV. Dr Carolin Sicks aware and is at bedside to see pt.

## 2016-09-07 NOTE — Consult Note (Signed)
Reason for Consult:Acute cholecystitis  Referring Physician: Mekhai Venuto is an 66 y.o. male.  HPI: This is a 66 yo male with multiple medical issues including chronic kidney disease, DM, hypertension, rheumatoid arthritis, who has been admitted at [facility] for the last two days for right lower lobe pneumonia. He has been tachypneic and occasionally hypoxic.   Blood cultures showed GNR consistent with E. Coli.  He has been on Zithromax and Zosyn.  He also complained of some abdominal pain.  CT scan on admission was unremarkable except for umbilical hernia and left inguinal hernia.  Another scan was repeated today which showed pericholecystic fluid and a small non-calcified stone near the neck of the gallbladder.  There also seems to be a subhepatic fluid collection involving the right lobe of the liver.   The scan also confirmed right lower lobe pneumonia.    Surgery at [facility] was consulted, but recommended transfer to [facility].  "Stat surgery consult called, spoke with Dr. Arnoldo Morale about CT findings. Because of hypoxia, pneumonia and unavailability of anesthesia at [facility], Dr. Arnoldo Morale suggested to transfer patient to [facility]."  It does not appear that the surgeon actually evaluated the patient prior to transfer.  The patient was then transferred to [facility] - stepdown unit at [facility] and we are asked to see the patient.  Past Medical History:  Diagnosis Date  . Chronic kidney disease   . Diabetes mellitus without complication ([place])   . Hypertension   . Rheumatoid arthritis ([place])   . Vitamin D deficiency    Hx: of    Past Surgical History:  Procedure Laterality Date  . AV FISTULA PLACEMENT Left 06/28/2013   Procedure: ARTERIOVENOUS (AV) FISTULA CREATION- Left arm with ultrasound guidance;  Surgeon: Rosetta Posner, MD;  Location: [facility] OR;  Service: Vascular;  Laterality: Left;  . COLONOSCOPY     hx: of  . HERNIA REPAIR    . INSERTION OF  DIALYSIS CATHETER N/A 06/28/2013   Procedure: INSERTION OF DIALYSIS CATHETER;  Surgeon: Rosetta Posner, MD;  Location: [facility] OR;  Service: Vascular;  Laterality: N/A;  . LIGATION OF COMPETING BRANCHES OF ARTERIOVENOUS FISTULA Left 10/12/2013   Procedure: LIGATION OF COMPETING BRANCHES OF ARTERIOVENOUS FISTULA;  Surgeon: Elam Dutch, MD;  Location: [facility] OR;  Service: Vascular;  Laterality: Left;  . neck surgery      Family History  Problem Relation Age of Onset  . Cancer Mother   . Arthritis/Rheumatoid Sister     Social History:  reports that he quit smoking about 40 years ago. His smoking use included Cigarettes. He has a 5.00 pack-year smoking history. He has never used smokeless tobacco. He reports that he does not drink alcohol or use drugs.  Allergies:  Allergies  Allergen Reactions  . Glyburide Other (See Comments)    unknown  . Methotrexate Derivatives Other (See Comments)    Patient cannot remember    Medications:  Prior to Admission medications   Medication Sig Start Date End Date Taking? Authorizing Provider  acetaminophen (TYLENOL) 500 MG tablet Take 2 tablets (1,000 mg total) by mouth 3 (three) times daily as needed for pain. 02/07/12  Yes Zenia Resides, MD  aspirin 81 MG EC tablet Take 1 tablet (81 mg total) by mouth daily. Swallow whole. 02/07/12  Yes Zenia Resides, MD  atorvastatin (LIPITOR) 40 MG tablet Take 1 tablet by mouth daily. 08/13/16  Yes Historical Provider, MD  cholecalciferol (VITAMIN D) 1000 units tablet Take 1,000 Units by mouth daily.   Yes Historical Provider, MD  cloNIDine (CATAPRES) 0.2 MG tablet Take 0.2 mg by mouth daily.   Yes Historical Provider, MD  dicyclomine (BENTYL) 20 MG tablet Take 1 tablet (20 mg total) by mouth 2 (two) times daily as needed for spasms. 09/03/16  Yes Merryl Hacker, MD  diltiazem (DILACOR XR) 180 MG 24 hr capsule Take 1 capsule (180 mg total) by mouth daily. 08/05/13  Yes Waldemar Dickens, MD  furosemide (LASIX) 80 MG tablet  Take 80 mg by mouth daily.    Yes Historical Provider, MD  insulin aspart (NOVOLOG FLEXPEN) 100 UNIT/ML FlexPen Inject 7.5 Units into the skin 3 (three) times daily with meals. Patient taking differently: Inject 18 Units into the skin 3 (three) times daily with meals.  01/31/14  Yes Waldemar Dickens, MD  Insulin Glargine (LANTUS) 100 UNIT/ML Solostar Pen Inject 20 Units into the skin every morning. Increase 5 u if am CBG is greater than 200.  Decrease 5 u if am CBG is less than 150 Patient taking differently: Inject 40 Units into the skin every morning. Increase 5 u if am CBG is greater than 200.  Decrease 5 u if am CBG is less than 150 09/05/14  Yes Aquilla Hacker, MD  loperamide (IMODIUM) 2 MG capsule Take 1 capsule (2 mg total) by mouth 4 (four) times daily as needed for diarrhea or loose stools. 09/03/16  Yes Merryl Hacker, MD  Omega-3 Fatty Acids (FISH OIL PO) Take 1 capsule by mouth daily.   Yes Historical Provider, MD  ondansetron (ZOFRAN ODT) 4 MG disintegrating tablet Take 1 tablet (4 mg total) by mouth every 8 (eight) hours as needed for nausea or vomiting. 09/03/16  Yes Merryl Hacker, MD  sildenafil (VIAGRA) 50 MG tablet Take 0.5-1 tablets (25-50 mg total) by mouth daily as needed for erectile dysfunction. 02/01/14  Yes Waldemar Dickens, MD  TRULICITY 1.5 WU/9.8JX SOPN Inject 1 Units as directed once a week. Patient isn't sure how many units he takes. Patient takes on Wednesday's. 08/13/16  Yes Historical Provider, MD  Insulin Pen Needle (PEN NEEDLES) 31G X 6 MM MISC Use 1 needle per injection as directed 07/01/13   Patrecia Pour, MD  UNIFINE PENTIPS 31G X 5 MM MISC USE AS DIRECTED WITH INSULIN    Waldemar Dickens, MD     Results for orders placed or performed during the hospital encounter of 09/05/16 (from the past 48 hour(s))  CBC     Status: Abnormal   Collection Time: 09/06/16  6:21 AM  Result Value Ref Range   WBC 10.8 (H) 4.0 - 10.5 K/uL   RBC 4.32 4.22 - 5.81 MIL/uL   Hemoglobin  11.8 (L) 13.0 - 17.0 g/dL   HCT 36.6 (L) 39.0 - 52.0 %   MCV 84.7 78.0 - 100.0 fL   MCH 27.3 26.0 - 34.0 pg   MCHC 32.2 30.0 - 36.0 g/dL   RDW 15.0 11.5 - 15.5 %   Platelets 127 (L) 150 - 400 K/uL  Basic metabolic panel     Status: Abnormal   Collection Time: 09/06/16  6:21 AM  Result Value Ref Range   Sodium 139 135 - 145 mmol/L   Potassium 4.5 3.5 - 5.1 mmol/L    Comment: DELTA CHECK NOTED   Chloride 109 101 - 111 mmol/L   CO2 21 (L) 22 - 32 mmol/L  Glucose, Bld 245 (H) 65 - 99 mg/dL   BUN 29 (H) 6 - 20 mg/dL   Creatinine, Ser 2.67 (H) 0.61 - 1.24 mg/dL   Calcium 7.8 (L) 8.9 - 10.3 mg/dL   GFR calc non Af Amer 23 (L) >60 mL/min   GFR calc Af Amer 27 (L) >60 mL/min    Comment: (NOTE) The eGFR has been calculated using the CKD EPI equation. This calculation has not been validated in all clinical situations. eGFR's persistently <60 mL/min signify possible Chronic Kidney Disease.    Anion gap 9 5 - 15  Magnesium     Status: None   Collection Time: 09/06/16  6:21 AM  Result Value Ref Range   Magnesium 1.7 1.7 - 2.4 mg/dL  Glucose, capillary     Status: Abnormal   Collection Time: 09/06/16  7:40 AM  Result Value Ref Range   Glucose-Capillary 232 (H) 65 - 99 mg/dL  Glucose, capillary     Status: Abnormal   Collection Time: 09/06/16 11:10 AM  Result Value Ref Range   Glucose-Capillary 264 (H) 65 - 99 mg/dL  Glucose, capillary     Status: Abnormal   Collection Time: 09/06/16  4:18 PM  Result Value Ref Range   Glucose-Capillary 275 (H) 65 - 99 mg/dL  Glucose, capillary     Status: Abnormal   Collection Time: 09/06/16  9:00 PM  Result Value Ref Range   Glucose-Capillary 267 (H) 65 - 99 mg/dL   Comment 1 Notify RN    Comment 2 Document in Chart   Basic metabolic panel     Status: Abnormal   Collection Time: 09/07/16  7:19 AM  Result Value Ref Range   Sodium 136 135 - 145 mmol/L   Potassium 3.9 3.5 - 5.1 mmol/L   Chloride 103 101 - 111 mmol/L   CO2 24 22 - 32 mmol/L    Glucose, Bld 300 (H) 65 - 99 mg/dL   BUN 39 (H) 6 - 20 mg/dL   Creatinine, Ser 3.93 (H) 0.61 - 1.24 mg/dL    Comment: DELTA CHECK NOTED   Calcium 8.2 (L) 8.9 - 10.3 mg/dL   GFR calc non Af Amer 15 (L) >60 mL/min   GFR calc Af Amer 17 (L) >60 mL/min    Comment: (NOTE) The eGFR has been calculated using the CKD EPI equation. This calculation has not been validated in all clinical situations. eGFR's persistently <60 mL/min signify possible Chronic Kidney Disease.    Anion gap 9 5 - 15  CBC     Status: Abnormal   Collection Time: 09/07/16  7:19 AM  Result Value Ref Range   WBC 11.4 (H) 4.0 - 10.5 K/uL   RBC 4.21 (L) 4.22 - 5.81 MIL/uL   Hemoglobin 11.4 (L) 13.0 - 17.0 g/dL   HCT 35.3 (L) 39.0 - 52.0 %   MCV 83.8 78.0 - 100.0 fL   MCH 27.1 26.0 - 34.0 pg   MCHC 32.3 30.0 - 36.0 g/dL   RDW 15.5 11.5 - 15.5 %   Platelets 130 (L) 150 - 400 K/uL  Brain natriuretic peptide     Status: Abnormal   Collection Time: 09/07/16  7:19 AM  Result Value Ref Range   B Natriuretic Peptide 101.0 (H) 0.0 - 100.0 pg/mL  Glucose, capillary     Status: Abnormal   Collection Time: 09/07/16  7:54 AM  Result Value Ref Range   Glucose-Capillary 276 (H) 65 - 99 mg/dL  Glucose, capillary  Status: Abnormal   Collection Time: 09/07/16 11:20 AM  Result Value Ref Range   Glucose-Capillary 308 (H) 65 - 99 mg/dL  Culture, blood (routine x 2)     Status: None (Preliminary result)   Collection Time: 09/07/16 11:58 AM  Result Value Ref Range   Specimen Description BLOOD RIGHT WRIST    Special Requests BOTTLES DRAWN AEROBIC AND ANAEROBIC 6CC EACH    Culture PENDING    Report Status PENDING   Culture, blood (routine x 2)     Status: None (Preliminary result)   Collection Time: 09/07/16 12:08 PM  Result Value Ref Range   Specimen Description BLOOD RIGHT HAND    Special Requests BOTTLES DRAWN AEROBIC ONLY 4CC ONLY    Culture PENDING    Report Status PENDING   Magnesium     Status: None   Collection Time:  09/07/16 12:08 PM  Result Value Ref Range   Magnesium 2.1 1.7 - 2.4 mg/dL  Comprehensive metabolic panel     Status: Abnormal   Collection Time: 09/07/16  4:38 PM  Result Value Ref Range   Sodium 138 135 - 145 mmol/L   Potassium 4.1 3.5 - 5.1 mmol/L   Chloride 105 101 - 111 mmol/L   CO2 24 22 - 32 mmol/L   Glucose, Bld 265 (H) 65 - 99 mg/dL   BUN 44 (H) 6 - 20 mg/dL   Creatinine, Ser 4.48 (H) 0.61 - 1.24 mg/dL   Calcium 8.2 (L) 8.9 - 10.3 mg/dL   Total Protein 6.3 (L) 6.5 - 8.1 g/dL   Albumin 2.1 (L) 3.5 - 5.0 g/dL   AST 64 (H) 15 - 41 U/L   ALT 45 17 - 63 U/L   Alkaline Phosphatase 107 38 - 126 U/L   Total Bilirubin 2.2 (H) 0.3 - 1.2 mg/dL   GFR calc non Af Amer 12 (L) >60 mL/min   GFR calc Af Amer 14 (L) >60 mL/min    Comment: (NOTE) The eGFR has been calculated using the CKD EPI equation. This calculation has not been validated in all clinical situations. eGFR's persistently <60 mL/min signify possible Chronic Kidney Disease.    Anion gap 9 5 - 15  Lipase, blood     Status: Abnormal   Collection Time: 09/07/16  4:38 PM  Result Value Ref Range   Lipase 77 (H) 11 - 51 U/L  Glucose, capillary     Status: Abnormal   Collection Time: 09/07/16  5:08 PM  Result Value Ref Range   Glucose-Capillary 247 (H) 65 - 99 mg/dL  Glucose, capillary     Status: Abnormal   Collection Time: 09/07/16  9:41 PM  Result Value Ref Range   Glucose-Capillary 150 (H) 65 - 99 mg/dL   Comment 1 Notify RN     Ct Abdomen Pelvis Wo Contrast  Result Date: 09/07/2016 CLINICAL DATA:  66 year old male with history of sepsis due to E coli bacteremia. Pneumonia. Acute on chronic kidney disease. EXAM: CT ABDOMEN AND PELVIS WITHOUT CONTRAST TECHNIQUE: Multidetector CT imaging of the abdomen and pelvis was performed following the standard protocol without IV contrast. COMPARISON:  CT the abdomen and pelvis 09/03/2016. FINDINGS: Lower chest: Multiple new areas of consolidation and volume loss throughout the lung  bases bilaterally, most severe in the lower lobes (right greater than left), concerning for sequela of recent aspiration. Trace bilateral pleural effusions. Hepatobiliary: New compared to the prior examination is a subhepatic fluid collection and beneath the right lobe of the liver which  is likely to be subcapsular. This is best appreciated on sagittal image 31 of series 6 and coronal image 56 of series 5 where it measures 10.6 x 3.8 x 8.6 cm. No other definite intrahepatic lesions are identified on today's noncontrast CT examination. Small focus of intermediate to high attenuation lying dependently in the gallbladder likely represents a noncalcified gallstone. Small amount of pericholecystic fluid. Haziness and inflammatory changes in the soft tissues surrounding the gallbladder. Gallbladder appears moderately distended. Pancreas: No definite pancreatic mass or peripancreatic inflammatory changes are noted on today's noncontrast CT examination. Spleen: Unremarkable. Adrenals/Urinary Tract: 4.5 cm low-attenuation lesion in the lateral aspect of the interpolar region of the left kidney is incompletely characterized, but previously characterized as a simple cyst on prior study 05/21/2011. Right kidney and bilateral adrenal glands are normal in appearance. No hydroureteronephrosis. Urinary bladder is nearly completely decompressed around an indwelling Foley balloon catheter. Small amount of gas non dependently in the lumen of the urinary bladder is iatrogenic. Stomach/Bowel: Unenhanced appearance of the stomach is normal. There is no pathologic dilatation of small bowel or colon. Small duodenal diverticulum extending off the medial aspect of the second portion of the duodenum incidentally noted. No surrounding inflammatory changes to suggest an associated diverticulitis at this time. There also a few scattered colonic diverticulae, also without surrounding inflammatory changes to suggest an acute diverticulitis at this  time. Normal appendix. Vascular/Lymphatic: Atherosclerotic calcifications in the abdominal aorta (mild), without evidence of aneurysm. No lymphadenopathy noted in the abdomen or pelvis on today's noncontrast CT examination. Reproductive: Prostate gland seminal vesicles are unremarkable in appearance. Other: Moderate sized left inguinal hernia containing only fat. No significant volume of ascites. No pneumoperitoneum. Musculoskeletal: There are no aggressive appearing lytic or blastic lesions noted in the visualized portions of the skeleton. IMPRESSION: 1. Interval development of extensive pericholecystic fluid. There is a small noncalcified gallstone or sludge ball near the neck of the gallbladder. Notably, there are also inflammatory changes around the gallbladder and the pericholecystic fluid appears contiguous with a subhepatic fluid collection beneath the right lobe of the liver, which appears to be likely subcapsular in location. Clinical correlation for signs and symptoms of acute cholecystitis is strongly recommended. 2. The appearance of the lower thorax suggests sequela of recent aspiration with multifocal aspiration pneumonitis/pneumonia. 3. Aortic atherosclerosis. 4. Additional incidental findings, as above. These results will be called to the ordering clinician or representative by the Radiologist Assistant, and communication documented in the PACS or zVision Dashboard. Electronically Signed   By: Vinnie Langton M.D.   On: 09/07/2016 16:52   Ct Chest Wo Contrast  Result Date: 09/07/2016 CLINICAL DATA:  66 year old male with hypoxia, fever and right-sided chest pain. Evaluate for pneumonia. EXAM: CT CHEST WITHOUT CONTRAST TECHNIQUE: Multidetector CT imaging of the chest was performed following the standard protocol without IV contrast. COMPARISON:  Chest x-ray obtained earlier today ; prior CT scan of the abdomen and pelvis 09/03/2016 FINDINGS: Cardiovascular: Limited evaluation in the absence of  intravenous contrast. Conventional 3 vessel arch anatomy. No evidence of aneurysm. The heart is normal in size. No pericardial effusion. The main pulmonary artery is borderline enlarged at 3.2 cm. Mediastinum/Nodes: Unremarkable CT appearance of the thyroid gland. No suspicious mediastinal or hilar adenopathy. No soft tissue mediastinal mass. The thoracic esophagus is unremarkable. Lungs/Pleura: Multifocal atelectasis involving the right middle and bilateral lower lobes. The degree of opacification of the right lower lobe is slightly greater than expected for atelectasis and a small amount of superimposed  consolidation is difficult to exclude entirely. There is respiratory motion artifact which slightly limits evaluation. Trace left-sided pleural effusion. Upper Abdomen: Incompletely imaged gallbladder distension. Musculoskeletal: No acute fracture or aggressive appearing lytic or blastic osseous lesion. IMPRESSION: 1. Pulmonary findings are most consistent with multifocal atelectasis involving the right middle, right lower and left lower lobes. The degree of opacity in the right lower lobe is slightly greater than expected for the amount of volume loss and therefore a superimposed infiltrate/pneumonia is difficult to exclude radiographically. 2. Trace left pleural effusion. 3. Borderline enlarged main pulmonary artery. Query clinical history of pulmonary arterial hypertension? 4. Incompletely imaged gallbladder appears distended. Does the patient have right upper quadrant pain? If there is clinical concern for cholecystitis, consider right upper quadrant ultrasound. Electronically Signed   By: Jacqulynn Cadet M.D.   On: 09/07/2016 14:10   Dg Chest Port 1 View  Result Date: 09/07/2016 CLINICAL DATA:  Hypoxia EXAM: PORTABLE CHEST 1 VIEW COMPARISON:  Chest radiograph and chest CT September 07, 2016 FINDINGS: There is persistent patchy airspace consolidation in the left mid lung and right base regions. There is no  evident new opacity compared to earlier in the day. Heart is mildly enlarged with pulmonary vascularity within normal limits. There is atherosclerotic calcification in the aorta. There is stable eventration of the right hemidiaphragm. No adenopathy. No bone lesions. IMPRESSION: Areas of airspace consolidation in the left mid lung and right base regions. No new opacity. Stable cardiac silhouette. There is aortic atherosclerosis. Electronically Signed   By: Lowella Grip III M.D.   On: 09/07/2016 21:45   Dg Chest Port 1 View  Result Date: 09/07/2016 CLINICAL DATA:  Fever, hypoxia.  Subsequent encounter. EXAM: PORTABLE CHEST 1 VIEW COMPARISON:  09/05/2016 FINDINGS: There is opacity at the right lung base which is similar to the prior exam allowing for differences in patient positioning and technique. Mild linear scarring or atelectasis in the left mid lung, stable. Remainder of the lungs is clear. Elevation right hemidiaphragm is stable. No obvious pleural effusion. No pneumothorax. IMPRESSION: 1. Right base opacity is similar to the prior exam. Although this may be atelectasis only, pneumonia should be considered likely given the symptoms of fever and hypoxia. 2. No significant change from the prior exam allowing for differences in positioning and technique. Electronically Signed   By: Lajean Manes M.D.   On: 09/07/2016 09:18    Review of Systems  Constitutional: Positive for chills and fever. Negative for weight loss.  HENT: Negative for ear discharge, ear pain, hearing loss and tinnitus.   Eyes: Negative for blurred vision, double vision, photophobia and pain.  Respiratory: Negative for cough, sputum production and shortness of breath.   Cardiovascular: Positive for chest pain.  Gastrointestinal: Positive for abdominal pain and constipation. Negative for nausea and vomiting.  Genitourinary: Negative for dysuria, flank pain, frequency and urgency.  Musculoskeletal: Negative for back pain, falls,  joint pain, myalgias and neck pain.  Neurological: Negative for dizziness, tingling, sensory change, focal weakness, loss of consciousness and headaches.  Endo/Heme/Allergies: Does not bruise/bleed easily.  Psychiatric/Behavioral: Negative for depression, memory loss and substance abuse. The patient is not nervous/anxious.    Blood pressure (!) 147/53, pulse 99, temperature 99 F (37.2 C), temperature source Oral, resp. rate (!) 22, height 6' 1"  (1.854 m), weight 111.4 kg (245 lb 9.5 oz), SpO2 96 %. Physical Exam  WDWN in NAD;  Eyes:  Pupils equal, round; sclera anicteric HENT:  Oral mucosa moist; good dentition  Neck:  No masses palpated, no thyromegaly Lungs:  Mildly tachypneic; occasional wheezes CV:  Regular rate and rhythm; no murmurs; extremities well-perfused with no edema Abd:  +bowel sounds, mildly distended; moderate RUQ tenderness, no palpable organomegaly; no palpable hernias Skin:  Warm, dry; no sign of jaundice Psychiatric - alert and oriented x 4; calm mood and affect  Assessment/Plan: 1.  Right lower lobe pneumonia with tachypnea, hypoxia, fever 2.  Acute cholecystitis 3.  Chronic kidney disease 4.  Rheumatoid arthritis 5.  Diabetes 6.  Hypertension  Patient is not a good surgical candidate at this time with active pneumonia.  Would recommend percutaneous cholecystostomy to decompress the gallbladder.  After he has fully recovered from his pneumonia, then we can consider elective laparoscopic cholecystectomy either here or at [facility].    Dawaun Brancato K. 09/07/2016, 10:39 PM

## 2016-09-07 NOTE — Progress Notes (Addendum)
Patient was transferred to stepdown unit. Please see my progress note from earlier today for detail hospital course.   Patient with RUQ pain and nausea.  Also with hypoxia requiring about 5-6 liters of oxygen.  Re-examined at bedside.  CT chest with pneumonia CT abdomen with acute cholecytitis.   Treated with dilaudid with improvement in pain.  Patient reported feeling better. HR , BP acceptable.  Not is distress  Plan: -stat surgery consult called, spoke with Dr. Arnoldo Morale about CT findings. Because of hypoxia, pneumonia and unavailability of anesthesia at [facility], Dr. Arnoldo Morale suggested to transfer patient to [facility].  -I called [facility] to transfer patient to step down unit at [facility].  -on IVF -keep NPO, add protonix Iv, dilaudid prn -dc heparin sq  I will discuss with [place] colleague at night at [facility].  I discussed with the patient and his wife at bedside. They agreed with the plan.

## 2016-09-07 NOTE — Progress Notes (Signed)
Placed 16FR foley catheter per policy and order.  Pt tolerated well.

## 2016-09-07 NOTE — Progress Notes (Signed)
Pt transported to ICU bed 5 via bed.

## 2016-09-08 ENCOUNTER — Encounter (HOSPITAL_COMMUNITY): Payer: Self-pay | Admitting: Radiology

## 2016-09-08 ENCOUNTER — Inpatient Hospital Stay (HOSPITAL_COMMUNITY): Payer: PPO

## 2016-09-08 DIAGNOSIS — N179 Acute kidney failure, unspecified: Secondary | ICD-10-CM

## 2016-09-08 DIAGNOSIS — K810 Acute cholecystitis: Secondary | ICD-10-CM

## 2016-09-08 HISTORY — PX: IR GENERIC HISTORICAL: IMG1180011

## 2016-09-08 LAB — BASIC METABOLIC PANEL
Anion gap: 10 (ref 5–15)
BUN: 51 mg/dL — AB (ref 6–20)
CO2: 20 mmol/L — ABNORMAL LOW (ref 22–32)
CREATININE: 5.95 mg/dL — AB (ref 0.61–1.24)
Calcium: 8.3 mg/dL — ABNORMAL LOW (ref 8.9–10.3)
Chloride: 110 mmol/L (ref 101–111)
GFR calc Af Amer: 10 mL/min — ABNORMAL LOW (ref >60)
GFR, EST NON AFRICAN AMERICAN: 9 mL/min — AB (ref >60)
Glucose, Bld: 102 mg/dL — ABNORMAL HIGH (ref 65–99)
POTASSIUM: 4.1 mmol/L (ref 3.5–5.1)
SODIUM: 140 mmol/L (ref 135–145)

## 2016-09-08 LAB — PROTIME-INR
INR: 1.11
PROTHROMBIN TIME: 14.4 s (ref 11.4–15.2)

## 2016-09-08 LAB — GLUCOSE, CAPILLARY
GLUCOSE-CAPILLARY: 123 mg/dL — AB (ref 65–99)
GLUCOSE-CAPILLARY: 72 mg/dL (ref 65–99)
GLUCOSE-CAPILLARY: 195 mg/dL — AB (ref 65–99)
GLUCOSE-CAPILLARY: 286 mg/dL — AB (ref 65–99)

## 2016-09-08 LAB — CBC
HCT: 31.8 % — ABNORMAL LOW (ref 39.0–52.0)
Hemoglobin: 10.4 g/dL — ABNORMAL LOW (ref 13.0–17.0)
MCH: 26.5 pg (ref 26.0–34.0)
MCHC: 32.7 g/dL (ref 30.0–36.0)
MCV: 81.1 fL (ref 78.0–100.0)
Platelets: 162 10*3/uL (ref 150–400)
RBC: 3.92 MIL/uL — ABNORMAL LOW (ref 4.22–5.81)
RDW: 16 % — AB (ref 11.5–15.5)
WBC: 11.3 10*3/uL — AB (ref 4.0–10.5)

## 2016-09-08 LAB — CULTURE, BLOOD (ROUTINE X 2)

## 2016-09-08 LAB — LIPASE, BLOOD: Lipase: 71 U/L — ABNORMAL HIGH (ref 11–51)

## 2016-09-08 MED ORDER — PIPERACILLIN-TAZOBACTAM IN DEX 2-0.25 GM/50ML IV SOLN
2.2500 g | Freq: Four times a day (QID) | INTRAVENOUS | Status: DC
  Administered 2016-09-08 – 2016-09-10 (×7): 2.25 g via INTRAVENOUS
  Filled 2016-09-08 (×8): qty 50

## 2016-09-08 MED ORDER — LEVALBUTEROL HCL 0.63 MG/3ML IN NEBU
0.6300 mg | INHALATION_SOLUTION | Freq: Four times a day (QID) | RESPIRATORY_TRACT | Status: DC
  Administered 2016-09-08 – 2016-09-10 (×11): 0.63 mg via RESPIRATORY_TRACT
  Filled 2016-09-08 (×11): qty 3

## 2016-09-08 MED ORDER — METOPROLOL TARTRATE 5 MG/5ML IV SOLN
2.5000 mg | INTRAVENOUS | Status: DC | PRN
  Administered 2016-09-08 – 2016-09-11 (×2): 2.5 mg via INTRAVENOUS
  Administered 2016-09-11: 5 mg via INTRAVENOUS
  Filled 2016-09-08 (×3): qty 5

## 2016-09-08 MED ORDER — SENNOSIDES-DOCUSATE SODIUM 8.6-50 MG PO TABS
1.0000 | ORAL_TABLET | Freq: Every day | ORAL | Status: DC
  Administered 2016-09-10 – 2016-09-28 (×19): 1 via ORAL
  Filled 2016-09-08 (×22): qty 1

## 2016-09-08 MED ORDER — SODIUM CHLORIDE 0.9 % IV SOLN
INTRAVENOUS | Status: AC | PRN
  Administered 2016-09-08: 10 mL/h via INTRAVENOUS

## 2016-09-08 MED ORDER — IPRATROPIUM BROMIDE 0.02 % IN SOLN
0.5000 mg | Freq: Four times a day (QID) | RESPIRATORY_TRACT | Status: DC
  Administered 2016-09-08 – 2016-09-09 (×4): 0.5 mg via RESPIRATORY_TRACT
  Filled 2016-09-08 (×5): qty 2.5

## 2016-09-08 MED ORDER — LIDOCAINE HCL (PF) 1 % IJ SOLN
INTRAMUSCULAR | Status: AC | PRN
  Administered 2016-09-08: 8 mL

## 2016-09-08 MED ORDER — LIDOCAINE HCL (PF) 1 % IJ SOLN
INTRAMUSCULAR | Status: AC
  Filled 2016-09-08: qty 10

## 2016-09-08 MED ORDER — FENTANYL CITRATE (PF) 100 MCG/2ML IJ SOLN
INTRAMUSCULAR | Status: AC | PRN
  Administered 2016-09-08: 25 ug via INTRAVENOUS

## 2016-09-08 MED ORDER — SODIUM CHLORIDE 0.9 % IV SOLN
INTRAVENOUS | Status: DC
  Administered 2016-09-08 – 2016-09-09 (×2): via INTRAVENOUS

## 2016-09-08 MED ORDER — FENTANYL CITRATE (PF) 100 MCG/2ML IJ SOLN
INTRAMUSCULAR | Status: AC
  Filled 2016-09-08: qty 4

## 2016-09-08 MED ORDER — DILTIAZEM HCL 100 MG IV SOLR
5.0000 mg/h | INTRAVENOUS | Status: DC
  Administered 2016-09-08 – 2016-09-09 (×2): 5 mg/h via INTRAVENOUS
  Administered 2016-09-10: 15 mg/h via INTRAVENOUS
  Filled 2016-09-08 (×4): qty 100

## 2016-09-08 MED ORDER — IOPAMIDOL (ISOVUE-300) INJECTION 61%
INTRAVENOUS | Status: AC
  Administered 2016-09-08: 10 mL
  Filled 2016-09-08: qty 50

## 2016-09-08 MED ORDER — METHYLPREDNISOLONE SODIUM SUCC 125 MG IJ SOLR
60.0000 mg | Freq: Two times a day (BID) | INTRAMUSCULAR | Status: DC
  Administered 2016-09-08 (×2): 60 mg via INTRAVENOUS
  Filled 2016-09-08 (×2): qty 2

## 2016-09-08 MED ORDER — MIDAZOLAM HCL 2 MG/2ML IJ SOLN
INTRAMUSCULAR | Status: AC | PRN
  Administered 2016-09-08: 1 mg via INTRAVENOUS

## 2016-09-08 MED ORDER — MIDAZOLAM HCL 2 MG/2ML IJ SOLN
INTRAMUSCULAR | Status: AC
  Filled 2016-09-08: qty 4

## 2016-09-08 NOTE — Progress Notes (Signed)
Chief Complaint: Patient was seen in consultation today for perc chole drain at the request of Dr. Verita Lamb  Referring Physician(s): Dr. Verita Lamb  Supervising Physician: Daryll Brod  Patient Status: [facility] - In-pt  History of Present Illness: Gary Lang is a 66 y.o. male who had been treated for pneumonia and E.Coli bacteremia at [facility]. He c/o abd pain and CT scan last pm showed extensive pericholecystic fluid and changes consistent with acute cholecystitis. He was transferred to [facility] and after eval by surgical team, he is deemed to high risk for OR given respiratory status/PNA. IR is requested to place perc chole drain. Chart, meds, labs, imaging reviewed. Pt has been NPO Still having RUQ pain, no N/V. On 4L Center O2  Past Medical History:  Diagnosis Date  . Chronic kidney disease   . Diabetes mellitus without complication ([employer])   . Hypertension   . Rheumatoid arthritis ([place])   . Vitamin D deficiency    Hx: of    Past Surgical History:  Procedure Laterality Date  . AV FISTULA PLACEMENT Left 06/28/2013   Procedure: ARTERIOVENOUS (AV) FISTULA CREATION- Left arm with ultrasound guidance;  Surgeon: Rosetta Posner, MD;  Location: [facility] OR;  Service: Vascular;  Laterality: Left;  . COLONOSCOPY     hx: of  . HERNIA REPAIR    . INSERTION OF DIALYSIS CATHETER N/A 06/28/2013   Procedure: INSERTION OF DIALYSIS CATHETER;  Surgeon: Rosetta Posner, MD;  Location: [facility] OR;  Service: Vascular;  Laterality: N/A;  . LIGATION OF COMPETING BRANCHES OF ARTERIOVENOUS FISTULA Left 10/12/2013   Procedure: LIGATION OF COMPETING BRANCHES OF ARTERIOVENOUS FISTULA;  Surgeon: Elam Dutch, MD;  Location: [facility] OR;  Service: Vascular;  Laterality: Left;  . neck surgery      Allergies: Glyburide and Methotrexate derivatives  Medications:  Current Facility-Administered Medications:  .  0.9 %  sodium chloride infusion, , Intravenous, Continuous, Reyne Dumas, MD .  aspirin EC  tablet 81 mg, 81 mg, Oral, Daily, Dron Tanna Furry, MD, 81 mg at 09/07/16 1010 .  bisacodyl (DULCOLAX) EC tablet 5 mg, 5 mg, Oral, Daily PRN, Dron Tanna Furry, MD, 5 mg at 09/07/16 1138 .  cholecalciferol (VITAMIN D) tablet 1,000 Units, 1,000 Units, Oral, Daily, Dron Tanna Furry, MD, 1,000 Units at 09/07/16 1009 .  cloNIDine (CATAPRES) tablet 0.2 mg, 0.2 mg, Oral, Daily, Dron Tanna Furry, MD, 0.2 mg at 09/07/16 1010 .  dicyclomine (BENTYL) capsule 20 mg, 20 mg, Oral, BID PRN, Dron Tanna Furry, MD .  diltiazem (DILACOR XR) 24 hr capsule 180 mg, 180 mg, Oral, Daily, Dron Tanna Furry, MD, 180 mg at 09/07/16 1009 .  HYDROmorphone (DILAUDID) injection 0.5 mg, 0.5 mg, Intravenous, Q4H PRN, Dron Tanna Furry, MD, 0.5 mg at 09/08/16 1941 .  insulin aspart (novoLOG) injection 0-15 Units, 0-15 Units, Subcutaneous, TID WC, Dron Tanna Furry, MD, 2 Units at 09/08/16 [phone] .  insulin aspart (novoLOG) injection 0-5 Units, 0-5 Units, Subcutaneous, QHS, Dron Tanna Furry, MD, 3 Units at 09/06/16 2200 .  insulin aspart (novoLOG) injection 4 Units, 4 Units, Subcutaneous, TID WC, Dron Tanna Furry, MD, 4 Units at 09/08/16 [phone] .  insulin glargine (LANTUS) injection 40 Units, 40 Units, Subcutaneous, q morning - 10a, Dron Tanna Furry, MD, 40 Units at 09/07/16 1010 .  ipratropium (ATROVENT) nebulizer solution 0.5 mg, 0.5 mg, Nebulization, Q6H, Dron Tanna Furry, MD .  levalbuterol [facility]) nebulizer solution 0.63 mg, 0.63 mg, Nebulization, Q6H, Dron Reesa Chew  Carolin Sicks, MD .  morphine 2 MG/ML injection 1 mg, 1 mg, Intravenous, Once, Rise Patience, MD .  ondansetron [facility]) injection 4 mg, 4 mg, Intravenous, Q6H PRN, Dron Tanna Furry, MD .  pantoprazole (PROTONIX) injection 40 mg, 40 mg, Intravenous, Q24H, Dron Tanna Furry, MD, 40 mg at 09/07/16 1753 .  piperacillin-tazobactam (ZOSYN) IVPB 3.375 g, 3.375 g, Intravenous, Q8H, Dron Tanna Furry, MD, 3.375 g at 09/08/16  0400 .  potassium chloride SA (K-DUR,KLOR-CON) CR tablet 20 mEq, 20 mEq, Oral, Daily, Dron Tanna Furry, MD, 20 mEq at 09/07/16 1009 .  senna-docusate (Senokot-S) tablet 1 tablet, 1 tablet, Oral, QHS, Nayana Abrol, MD .  sodium phosphate (FLEET) 7-19 GM/118ML enema 1 enema, 1 enema, Rectal, Daily PRN, Dron Tanna Furry, MD    Family History  Problem Relation Age of Onset  . Cancer Mother   . Arthritis/Rheumatoid Sister     Social History   Social History  . Marital status: Married    Spouse name: N/A  . Number of children: N/A  . Years of education: N/A   Social History Main Topics  . Smoking status: Former Smoker    Packs/day: 0.50    Years: 10.00    Types: Cigarettes    Quit date: 09/02/1976  . Smokeless tobacco: Never Used  . Alcohol use No  . Drug use: No  . Sexual activity: Not Asked   Other Topics Concern  . None   Social History Narrative  . None    Review of Systems: A 12 point ROS discussed and pertinent positives are indicated in the HPI above.  All other systems are negative.  Review of Systems  Vital Signs: BP (!) 147/60 (BP Location: Right Arm)   Pulse (!) 103   Temp 98.9 F (37.2 C) (Oral)   Resp (!) 30   Ht '6\' 1"'$  (1.854 m)   Wt 245 lb 9.5 oz (111.4 kg)   SpO2 100%   BMI 32.40 kg/m   Physical Exam  Constitutional: He is oriented to person, place, and time. He appears well-developed and well-nourished. No distress.  HENT:  Head: Normocephalic.  Mouth/Throat: Oropharynx is clear and moist.  Neck: Normal range of motion. No tracheal deviation present.  Cardiovascular: Regular rhythm and normal heart sounds.   Pulmonary/Chest: Effort normal. No respiratory distress.  Scattered rhonchi and wheezes.  Abdominal: Soft. He exhibits no distension and no mass. There is tenderness.  Tender RUQ  Neurological: He is alert and oriented to person, place, and time.  Psychiatric: He has a normal mood and affect. Judgment normal.    Mallampati  Score:  MD Evaluation Airway: WNL Heart: WNL Abdomen: WNL Chest/ Lungs: WNL ASA  Classification: 3 Mallampati/Airway Score: Two  Imaging: Ct Abdomen Pelvis Wo Contrast  Result Date: 09/07/2016 CLINICAL DATA:  66 year old male with history of sepsis due to E coli bacteremia. Pneumonia. Acute on chronic kidney disease. EXAM: CT ABDOMEN AND PELVIS WITHOUT CONTRAST TECHNIQUE: Multidetector CT imaging of the abdomen and pelvis was performed following the standard protocol without IV contrast. COMPARISON:  CT the abdomen and pelvis 09/03/2016. FINDINGS: Lower chest: Multiple new areas of consolidation and volume loss throughout the lung bases bilaterally, most severe in the lower lobes (right greater than left), concerning for sequela of recent aspiration. Trace bilateral pleural effusions. Hepatobiliary: New compared to the prior examination is a subhepatic fluid collection and beneath the right lobe of the liver which is likely to be subcapsular. This is best appreciated on sagittal image 31  of series 6 and coronal image 56 of series 5 where it measures 10.6 x 3.8 x 8.6 cm. No other definite intrahepatic lesions are identified on today's noncontrast CT examination. Small focus of intermediate to high attenuation lying dependently in the gallbladder likely represents a noncalcified gallstone. Small amount of pericholecystic fluid. Haziness and inflammatory changes in the soft tissues surrounding the gallbladder. Gallbladder appears moderately distended. Pancreas: No definite pancreatic mass or peripancreatic inflammatory changes are noted on today's noncontrast CT examination. Spleen: Unremarkable. Adrenals/Urinary Tract: 4.5 cm low-attenuation lesion in the lateral aspect of the interpolar region of the left kidney is incompletely characterized, but previously characterized as a simple cyst on prior study 05/21/2011. Right kidney and bilateral adrenal glands are normal in appearance. No  hydroureteronephrosis. Urinary bladder is nearly completely decompressed around an indwelling Foley balloon catheter. Small amount of gas non dependently in the lumen of the urinary bladder is iatrogenic. Stomach/Bowel: Unenhanced appearance of the stomach is normal. There is no pathologic dilatation of small bowel or colon. Small duodenal diverticulum extending off the medial aspect of the second portion of the duodenum incidentally noted. No surrounding inflammatory changes to suggest an associated diverticulitis at this time. There also a few scattered colonic diverticulae, also without surrounding inflammatory changes to suggest an acute diverticulitis at this time. Normal appendix. Vascular/Lymphatic: Atherosclerotic calcifications in the abdominal aorta (mild), without evidence of aneurysm. No lymphadenopathy noted in the abdomen or pelvis on today's noncontrast CT examination. Reproductive: Prostate gland seminal vesicles are unremarkable in appearance. Other: Moderate sized left inguinal hernia containing only fat. No significant volume of ascites. No pneumoperitoneum. Musculoskeletal: There are no aggressive appearing lytic or blastic lesions noted in the visualized portions of the skeleton.   IMPRESSION: 1. Interval development of extensive pericholecystic fluid. There is a small noncalcified gallstone or sludge ball near the neck of the gallbladder. Notably, there are also inflammatory changes around the gallbladder and the pericholecystic fluid appears contiguous with a subhepatic fluid collection beneath the right lobe of the liver, which appears to be likely subcapsular in location. Clinical correlation for signs and symptoms of acute cholecystitis is strongly recommended. 2. The appearance of the lower thorax suggests sequela of recent aspiration with multifocal aspiration pneumonitis/pneumonia. 3. Aortic atherosclerosis.    Labs:  CBC:  Recent Labs  09/02/16 2200 09/05/16 0721  09/06/16 0621 09/07/16 0719  WBC 12.9* 12.5* 10.8* 11.4*  HGB 13.0 13.5 11.8* 11.4*  HCT 39.7 40.8 36.6* 35.3*  PLT 220 137* 127* 130*    COAGS: No results for input(s): INR, APTT in the last 8760 hours.  BMP:  Recent Labs  09/05/16 0721 09/06/16 0621 09/07/16 0719 09/07/16 1638  NA 135 139 136 138  K 3.2* 4.5 3.9 4.1  CL 98* 109 103 105  CO2 23 21* 24 24  GLUCOSE 234* 245* 300* 265*  BUN 26* 29* 39* 44*  CALCIUM 8.3* 7.8* 8.2* 8.2*  CREATININE 2.57* 2.67* 3.93* 4.48*  GFRNONAA 24* 23* 15* 12*  GFRAA 28* 27* 17* 14*    LIVER FUNCTION TESTS:  Recent Labs  09/02/16 2200 09/05/16 0721 09/07/16 1638  BILITOT 0.3 2.4* 2.2*  AST 16 23 64*  ALT 17 21 45  ALKPHOS 81 86 107  PROT 7.5 7.4 6.3*  ALBUMIN 3.6 3.0* 2.1*    TUMOR MARKERS: No results for input(s): AFPTM, CEA, CA199, CHROMGRNA in the last 8760 hours.  Assessment and Plan: Acute cholecystitis PNA and hypoxia Imaging reviewed by Dr. Annamaria Boots, plan to proceed with  perc chole drain. Labs reviewed, PT/INR pending Risks and Benefits discussed with the patient including, but not limited to bleeding, infection, gallbladder perforation, bile leak, sepsis or even death. All of the patient's questions were answered, patient is agreeable to proceed. Consent signed and in chart.    Thank you for this interesting consult.  A copy of this report was sent to the requesting provider on this date.  Electronically Signed: Ascencion Dike 09/08/2016, 9:10 AM   I spent a total of 20 minutes in face to face in clinical consultation, greater than 50% of which was counseling/coordinating care for perc chole drain

## 2016-09-08 NOTE — Progress Notes (Signed)
[facility] Progress Note     Subjective: C/o mild RLQ abdominal pain, controlled with medication. Last bowel movement was over a week ago. Denies fever/cills.  Objective: Vital signs in last 24 hours: Temp:  [98.8 F (37.1 C)-99.5 F (37.5 C)] 98.9 F (37.2 C) (01/07 0401) Pulse Rate:  [89-103] 103 (01/07 0401) Resp:  [22-38] 30 (01/07 0401) BP: (139-152)/(53-71) 147/60 (01/07 0401) SpO2:  [94 %-100 %] 100 % (01/07 0401) Weight:  [111.4 kg (245 lb 9.5 oz)] 111.4 kg (245 lb 9.5 oz) (01/06 1459) Last BM Date: 09/02/16  Intake/Output from previous day: 01/06 0701 - 01/07 0700 In: 2226.7 [P.O.:340; I.V.:1736.7; IV Piggyback:150] Out: 525 [Urine:525] Intake/Output this shift: No intake/output data recorded.  PE: Gen:  Alert, NAD, pleasant Card:  Tachycardic Pulm:  unlabored Abd: Soft, protuberat, distended and tympanic. Tender to palpation of RLQ without guarding or peritonitis   Lab Results:   Recent Labs  09/06/16 0621 09/07/16 0719  WBC 10.8* 11.4*  HGB 11.8* 11.4*  HCT 36.6* 35.3*  PLT 127* 130*   BMET  Recent Labs  09/07/16 0719 09/07/16 1638  NA 136 138  K 3.9 4.1  CL 103 105  CO2 24 24  GLUCOSE 300* 265*  BUN 39* 44*  CREATININE 3.93* 4.48*  CALCIUM 8.2* 8.2*   PT/INR No results for input(s): LABPROT, INR in the last 72 hours. CMP     Component Value Date/Time   NA 138 09/07/2016 1638   K 4.1 09/07/2016 1638   CL 105 09/07/2016 1638   CO2 24 09/07/2016 1638   GLUCOSE 265 (H) 09/07/2016 1638   BUN 44 (H) 09/07/2016 1638   CREATININE 4.48 (H) 09/07/2016 1638   CALCIUM 8.2 (L) 09/07/2016 1638   PROT 6.3 (L) 09/07/2016 1638   ALBUMIN 2.1 (L) 09/07/2016 1638   AST 64 (H) 09/07/2016 1638   ALT 45 09/07/2016 1638   ALKPHOS 107 09/07/2016 1638   BILITOT 2.2 (H) 09/07/2016 1638   GFRNONAA 12 (L) 09/07/2016 1638   GFRAA 14 (L) 09/07/2016 1638   Lipase     Component Value Date/Time   LIPASE 77 (H) 09/07/2016 1638        Studies/Results: Ct Abdomen Pelvis Wo Contrast  Result Date: 09/07/2016 CLINICAL DATA:  66 year old male with history of sepsis due to E coli bacteremia. Pneumonia. Acute on chronic kidney disease. EXAM: CT ABDOMEN AND PELVIS WITHOUT CONTRAST TECHNIQUE: Multidetector CT imaging of the abdomen and pelvis was performed following the standard protocol without IV contrast. COMPARISON:  CT the abdomen and pelvis 09/03/2016. FINDINGS: Lower chest: Multiple new areas of consolidation and volume loss throughout the lung bases bilaterally, most severe in the lower lobes (right greater than left), concerning for sequela of recent aspiration. Trace bilateral pleural effusions. Hepatobiliary: New compared to the prior examination is a subhepatic fluid collection and beneath the right lobe of the liver which is likely to be subcapsular. This is best appreciated on sagittal image 31 of series 6 and coronal image 56 of series 5 where it measures 10.6 x 3.8 x 8.6 cm. No other definite intrahepatic lesions are identified on today's noncontrast CT examination. Small focus of intermediate to high attenuation lying dependently in the gallbladder likely represents a noncalcified gallstone. Small amount of pericholecystic fluid. Haziness and inflammatory changes in the soft tissues surrounding the gallbladder. Gallbladder appears moderately distended. Pancreas: No definite pancreatic mass or peripancreatic inflammatory changes are noted on today's noncontrast CT examination. Spleen: Unremarkable. Adrenals/Urinary Tract: 4.5 cm low-attenuation  lesion in the lateral aspect of the interpolar region of the left kidney is incompletely characterized, but previously characterized as a simple cyst on prior study 05/21/2011. Right kidney and bilateral adrenal glands are normal in appearance. No hydroureteronephrosis. Urinary bladder is nearly completely decompressed around an indwelling Foley balloon catheter. Small amount of gas  non dependently in the lumen of the urinary bladder is iatrogenic. Stomach/Bowel: Unenhanced appearance of the stomach is normal. There is no pathologic dilatation of small bowel or colon. Small duodenal diverticulum extending off the medial aspect of the second portion of the duodenum incidentally noted. No surrounding inflammatory changes to suggest an associated diverticulitis at this time. There also a few scattered colonic diverticulae, also without surrounding inflammatory changes to suggest an acute diverticulitis at this time. Normal appendix. Vascular/Lymphatic: Atherosclerotic calcifications in the abdominal aorta (mild), without evidence of aneurysm. No lymphadenopathy noted in the abdomen or pelvis on today's noncontrast CT examination. Reproductive: Prostate gland seminal vesicles are unremarkable in appearance. Other: Moderate sized left inguinal hernia containing only fat. No significant volume of ascites. No pneumoperitoneum. Musculoskeletal: There are no aggressive appearing lytic or blastic lesions noted in the visualized portions of the skeleton. IMPRESSION: 1. Interval development of extensive pericholecystic fluid. There is a small noncalcified gallstone or sludge ball near the neck of the gallbladder. Notably, there are also inflammatory changes around the gallbladder and the pericholecystic fluid appears contiguous with a subhepatic fluid collection beneath the right lobe of the liver, which appears to be likely subcapsular in location. Clinical correlation for signs and symptoms of acute cholecystitis is strongly recommended. 2. The appearance of the lower thorax suggests sequela of recent aspiration with multifocal aspiration pneumonitis/pneumonia. 3. Aortic atherosclerosis. 4. Additional incidental findings, as above. These results will be called to the ordering clinician or representative by the Radiologist Assistant, and communication documented in the PACS or zVision Dashboard.  Electronically Signed   By: Vinnie Langton M.D.   On: 09/07/2016 16:52   Ct Chest Wo Contrast  Result Date: 09/07/2016 CLINICAL DATA:  66 year old male with hypoxia, fever and right-sided chest pain. Evaluate for pneumonia. EXAM: CT CHEST WITHOUT CONTRAST TECHNIQUE: Multidetector CT imaging of the chest was performed following the standard protocol without IV contrast. COMPARISON:  Chest x-ray obtained earlier today ; prior CT scan of the abdomen and pelvis 09/03/2016 FINDINGS: Cardiovascular: Limited evaluation in the absence of intravenous contrast. Conventional 3 vessel arch anatomy. No evidence of aneurysm. The heart is normal in size. No pericardial effusion. The main pulmonary artery is borderline enlarged at 3.2 cm. Mediastinum/Nodes: Unremarkable CT appearance of the thyroid gland. No suspicious mediastinal or hilar adenopathy. No soft tissue mediastinal mass. The thoracic esophagus is unremarkable. Lungs/Pleura: Multifocal atelectasis involving the right middle and bilateral lower lobes. The degree of opacification of the right lower lobe is slightly greater than expected for atelectasis and a small amount of superimposed consolidation is difficult to exclude entirely. There is respiratory motion artifact which slightly limits evaluation. Trace left-sided pleural effusion. Upper Abdomen: Incompletely imaged gallbladder distension. Musculoskeletal: No acute fracture or aggressive appearing lytic or blastic osseous lesion. IMPRESSION: 1. Pulmonary findings are most consistent with multifocal atelectasis involving the right middle, right lower and left lower lobes. The degree of opacity in the right lower lobe is slightly greater than expected for the amount of volume loss and therefore a superimposed infiltrate/pneumonia is difficult to exclude radiographically. 2. Trace left pleural effusion. 3. Borderline enlarged main pulmonary artery. Query clinical history of pulmonary arterial  hypertension? 4.  Incompletely imaged gallbladder appears distended. Does the patient have right upper quadrant pain? If there is clinical concern for cholecystitis, consider right upper quadrant ultrasound. Electronically Signed   By: Jacqulynn Cadet M.D.   On: 09/07/2016 14:10   Dg Chest Port 1 View  Result Date: 09/07/2016 CLINICAL DATA:  Hypoxia EXAM: PORTABLE CHEST 1 VIEW COMPARISON:  Chest radiograph and chest CT September 07, 2016 FINDINGS: There is persistent patchy airspace consolidation in the left mid lung and right base regions. There is no evident new opacity compared to earlier in the day. Heart is mildly enlarged with pulmonary vascularity within normal limits. There is atherosclerotic calcification in the aorta. There is stable eventration of the right hemidiaphragm. No adenopathy. No bone lesions. IMPRESSION: Areas of airspace consolidation in the left mid lung and right base regions. No new opacity. Stable cardiac silhouette. There is aortic atherosclerosis. Electronically Signed   By: Lowella Grip III M.D.   On: 09/07/2016 21:45   Dg Chest Port 1 View  Result Date: 09/07/2016 CLINICAL DATA:  Fever, hypoxia.  Subsequent encounter. EXAM: PORTABLE CHEST 1 VIEW COMPARISON:  09/05/2016 FINDINGS: There is opacity at the right lung base which is similar to the prior exam allowing for differences in patient positioning and technique. Mild linear scarring or atelectasis in the left mid lung, stable. Remainder of the lungs is clear. Elevation right hemidiaphragm is stable. No obvious pleural effusion. No pneumothorax. IMPRESSION: 1. Right base opacity is similar to the prior exam. Although this may be atelectasis only, pneumonia should be considered likely given the symptoms of fever and hypoxia. 2. No significant change from the prior exam allowing for differences in positioning and technique. Electronically Signed   By: Lajean Manes M.D.   On: 09/07/2016 09:18    Anti-infectives: Anti-infectives    Start      Dose/Rate Route Frequency Ordered Stop   09/06/16 1000  azithromycin (ZITHROMAX) tablet 500 mg  Status:  Discontinued     500 mg Oral Every 24 hours 09/05/16 1422 09/08/16 0831   09/06/16 0800  cefTRIAXone (ROCEPHIN) 1 g in dextrose 5 % 50 mL IVPB  Status:  Discontinued     1 g 100 mL/hr over 30 Minutes Intravenous Every 24 hours 09/05/16 1422 09/05/16 1940   09/05/16 2000  piperacillin-tazobactam (ZOSYN) IVPB 3.375 g     3.375 g 12.5 mL/hr over 240 Minutes Intravenous Every 8 hours 09/05/16 1948     09/05/16 0845  cefTRIAXone (ROCEPHIN) 1 g in dextrose 5 % 50 mL IVPB     1 g 100 mL/hr over 30 Minutes Intravenous  Once 09/05/16 0836 09/05/16 0912   09/05/16 0845  azithromycin (ZITHROMAX) 500 mg in dextrose 5 % 250 mL IVPB     500 mg 250 mL/hr over 60 Minutes Intravenous  Once 09/05/16 0836 09/05/16 1017     Assessment/Plan Acute cholecystitis - poor surgical candidate secondary to active PNA; IR consult for pecutaneous cholecystostomy tube placement  Right lower lobe pneumonia with tachypnea, hypoxia, fever Chronic kidney disease Rheumatoid arthritis Diabetes Hypertension  FEN: NPO, IVF [id number], Zosyn VTE: subcutaneous heparin  Plan: perc cholecystostomy tube placement today by IR for gallbladder decompression     LOS: 3 days    Jill Alexanders , [facility] Surgery 09/08/2016, 8:55 AM Pager: [phone] Consults: [phone] Mon-Fri 7:00 am-4:30 pm Sat-Sun 7:00 am-11:30 am

## 2016-09-08 NOTE — Progress Notes (Signed)
Pt in Afib. EKG done unconfirmed AFIB RVR. Paged Abrol. Orders to start Cardizem gtt. Notified pharmacy for gtt not in pyxis due to backorder. Will continue to monitor.

## 2016-09-08 NOTE — Procedures (Signed)
S/p perc cholecystostomy  No comp Stable Exudative bile sent for cx Full report in PACS

## 2016-09-08 NOTE — Progress Notes (Signed)
Pharmacy Antibiotic Note  Gary Lang is a 66 y.o. male admitted on 09/05/2016 with bacteremia/cholecytitis.  Pharmacy has been consulted for zosyn dosing. He was transferred to [facility] on 1/7 for a percutaneous chole drain. WBC are down to 11.3 and patient is afebrile. Renal function has significantly worsened with Scr- 5.95 and clearance ~ 16 ml/min. Will dose adjust.   Plan: Zosyn 2.25 g every 6 hours for worsening renal function. F/U cultures and clinical progress  Height: '6\' 1"'$  (185.4 cm) Weight: 245 lb 9.5 oz (111.4 kg) IBW/kg (Calculated) : 79.9  Temp (24hrs), Avg:98.8 F (37.1 C), Min:98 F (36.7 C), Max:99.5 F (37.5 C)   Recent Labs Lab 09/02/16 2200 09/02/16 2320 09/05/16 0721 09/05/16 0738 09/05/16 1010 09/06/16 0621 09/07/16 0719 09/07/16 1638 09/08/16 1017  WBC 12.9*  --  12.5*  --   --  10.8* 11.4*  --  11.3*  CREATININE 1.25*  --  2.57*  --   --  2.67* 3.93* 4.48* 5.95*  LATICACIDVEN  --  1.3  --  2.80* 0.99  --   --   --   --     Normalized CrCl 28 mls/min  Estimated Creatinine Clearance: 16 mL/min (by C-G formula based on SCr of 5.95 mg/dL (H)).    Allergies  Allergen Reactions  . Glyburide Other (See Comments)    unknown  . Methotrexate Derivatives Other (See Comments)    Patient cannot remember    Antimicrobials this admission: Zosyn 1/4 >>  Ceftriaxone 1/4>> 1/4 Azithromycin 1/4>>1/7    Dose adjustments this admission: Zosyn 3.375 g Q 8 --> Zosyn 2.25 g Q 6    Microbiology results: 1/4 BCx: E.coli in 2 bottles BCID addressed.  1/4 UCx: multiple species  1/6 Repeat BCx: NGTD 1/7 Wound cx: in process   Thank you for allowing pharmacy to be a part of this patient's care.  Uvaldo Bristle, PharmD PGY1 Pharmacy Resident  Pager [phone] 09/08/2016 1:12 PM

## 2016-09-08 NOTE — Progress Notes (Addendum)
PROGRESS NOTE    Gary Lang  FXT:[id number] DOB: [birth year] DOA: 09/05/2016 PCP: [name], MD   Brief Narrative: 66 y.o. male with medical history significant of chronic kidney disease, diabetes, hypertension, rheumatoid arthritis, ex-smoker quit about 20 years ago presented with fever up to 102 at home associated with chills and right-sided chest pain.Patient with RUQ pain and nausea. CT imaging consistent with acute cholecystitis. Also with hypoxia requiring about 5-6 liters of oxygen. Transferred to [facility] for surgery evaluation  Assessment & Plan:   Active Problems:   Community acquired pneumonia of right lower lobe of lung ([place])   Acute respiratory failure with hypoxia ([place])   Acute cholecystitis due to biliary calculus   AKI (acute kidney injury) ([place])    #Sepsis due to E Coli bacteremia and possibly commonly acquired pneumonia/acute cholecystitis: Chest x-ray showed right lower lobe pneumonia. Patient with tachycardia, leukocytosis, fever and abnormal x-ray finding on admission -2 out of 2 blood cultures growing gram-negative rods consistent with Escherichia coli. Follow up final culture and sensitivity. Continue Zosyn. Discontinue azithromycin  -Recent CT scan of abdomen and pelvis with no acute finding.  source of Escherichia coli bacteremia likely gallbladder. -Respiratory viral studies negative. -Monitor leukocytosis, currently afebrile. -Continue nebulization treatment. Surgery recommends Donnie Mesa, MD percutaneous cholecystostomy to decompress the gallbladder.S/p perc cholecystostomy 09/08/16. After he has fully recovered from his pneumonia, then we can consider elective laparoscopic cholecystectomy    #Acute hypoxic respiratory failure likely in the setting of pneumonia and sepsis: Patient with higher requirement of oxygen last night. Currently on 4 L. Reported worsening shortness of breath. Repeat chest x-ray with no acute change. Continue treatment for  pneumonia. Continue to monitor.  CT chest shows multifocal atelectasis, possible superimposed pneumonia. Encourage incentive spirometry   #Acute on chronic kidney disease likely hemodynamically mediated in the setting of Lasix and decreased oral intake and sepsis: Continue to hold Lasix. -Serum creatinine level worsened today. Presented with a creatinine of 1.25. Creatinine 4.48. Continue IV hydration.  Place Foley for strict I's and O's   Avoid nephrotoxins. -Patient required hemodialysis in the past and he has left upper extremity AV fistula. I will continue to monitor closely.   #Elevated troponin likely in the setting of sepsis: Patient does not have chest pain. Continue telemetry monitoring.  -Echocardiogram showed vigorous left ventricular systolic function with EF of 65-30%. There were no regional wall motion abnormalities. Also with  grade 1 diastolic dysfunction. -Patient had tachycardia with heart rate of 160s this morning. Improved with metoprolol 5 IV. Continue diltiazem and clonidine. Does not have chest pain.   #Hypokalemia: Improved. Monitor BMP.  #Abdominal pain likely due to constipation: Abnormal x-ray showed no acute finding. Recent CT was unremarkable. Patient reported no bowel movement for last 4-5 days. Still no bowel movement. Continue MiraLAX and Dulcolax. Lipase elevated, repeat  A fib? EKG pending to confirm a fib Patient has been transitioned to cardizem gtt     DVT prophylaxis: Heparin subcutaneous Code Status: Full code Family Communication: No family present at bedside Disposition Plan: Continue stepdown    Consultants:   None  Procedures: None Antimicrobials: Azithromycin and Zosyn.  Subjective:   Tachycardic this am   Objective: Vitals:   09/07/16 2120 09/07/16 2354 09/08/16 0036 09/08/16 0401  BP: (!) 147/53 (!) 143/60  (!) 147/60  Pulse: 99 100  (!) 103  Resp: (!) 22 (!) 30  (!) 30  Temp: 99 F (37.2 C) 99.1 F (37.3 C)  98.9  F (37.2 C)  TempSrc: Oral Oral  Oral  SpO2: 96% 97% 96% 100%  Weight:      Height:        Intake/Output Summary (Last 24 hours) at 09/08/16 0174 Last data filed at 09/08/16 0500  Gross per 24 hour  Intake          2226.67 ml  Output              525 ml  Net          1701.67 ml   Filed Weights   09/05/16 0729 09/05/16 1442 09/07/16 1459  Weight: 102.1 kg (225 lb) 102.1 kg (225 lb) 111.4 kg (245 lb 9.5 oz)    Examination:  General exam: Ill-looking male, lying in bed, not in distress Respiratory system: Clear bilaterally except mild intermittent wheeze, speaking full sentences. Cardiovascular system: S1 and S2 normal. Regular rate rhythm. No pedal edema. Gastrointestinal system: Abdomen soft, nontender, nondistended. Bowel sound positive.  Central nervous system: Alert and oriented. No focal neurological deficits. Extremities: Symmetric 5 x 5 power. Skin: No rashes, lesions or ulcers Psychiatry: Judgement and insight appear normal. Mood & affect appropriate.     Data Reviewed: I have personally reviewed following labs and imaging studies  CBC:  Recent Labs Lab 09/02/16 2200 09/05/16 0721 09/06/16 0621 09/07/16 0719  WBC 12.9* 12.5* 10.8* 11.4*  NEUTROABS 11.2* 11.4*  --   --   HGB 13.0 13.5 11.8* 11.4*  HCT 39.7 40.8 36.6* 35.3*  MCV 83.1 84.1 84.7 83.8  PLT 220 137* 127* 944*   Basic Metabolic Panel:  Recent Labs Lab 09/02/16 2200 09/05/16 0721 09/06/16 0621 09/07/16 0719 09/07/16 1208 09/07/16 1638  NA 138 135 139 136  --  138  K 3.4* 3.2* 4.5 3.9  --  4.1  CL 102 98* 109 103  --  105  CO2 26 23 21* 24  --  24  GLUCOSE 194* 234* 245* 300*  --  265*  BUN 16 26* 29* 39*  --  44*  CREATININE 1.25* 2.57* 2.67* 3.93*  --  4.48*  CALCIUM 8.8* 8.3* 7.8* 8.2*  --  8.2*  MG  --   --  1.7  --  2.1  --    GFR: Estimated Creatinine Clearance: 21.2 mL/min (by C-G formula based on SCr of 4.48 mg/dL (H)). Liver Function Tests:  Recent Labs Lab  09/02/16 2200 09/05/16 0721 09/07/16 1638  AST 16 23 64*  ALT 17 21 45  ALKPHOS 81 86 107  BILITOT 0.3 2.4* 2.2*  PROT 7.5 7.4 6.3*  ALBUMIN 3.6 3.0* 2.1*    Recent Labs Lab 09/02/16 2200 09/05/16 0721 09/07/16 1638  LIPASE 35 22 77*   No results for input(s): AMMONIA in the last 168 hours. Coagulation Profile: No results for input(s): INR, PROTIME in the last 168 hours. Cardiac Enzymes:  Recent Labs Lab 09/05/16 0721 09/05/16 1517 09/05/16 2023  TROPONINI 0.06* 0.05* 0.05*   BNP (last 3 results) No results for input(s): PROBNP in the last 8760 hours. HbA1C: No results for input(s): HGBA1C in the last 72 hours. CBG:  Recent Labs Lab 09/07/16 0754 09/07/16 1120 09/07/16 1708 09/07/16 2141 09/08/16 0813  GLUCAP 276* 308* 247* 150* 123*   Lipid Profile: No results for input(s): CHOL, HDL, LDLCALC, TRIG, CHOLHDL, LDLDIRECT in the last 72 hours. Thyroid Function Tests: No results for input(s): TSH, T4TOTAL, FREET4, T3FREE, THYROIDAB in the last 72 hours. Anemia Panel: No results for input(s): VITAMINB12, FOLATE,  FERRITIN, TIBC, IRON, RETICCTPCT in the last 72 hours. Sepsis Labs:  Recent Labs Lab 09/02/16 2320 09/05/16 0738 09/05/16 1010  LATICACIDVEN 1.3 2.80* 0.99    Recent Results (from the past 240 hour(s))  Urine culture     Status: Abnormal   Collection Time: 09/03/16 12:36 AM  Result Value Ref Range Status   Specimen Description URINE, CLEAN CATCH  Final   Special Requests NONE  Final   Culture (A)  Final    <10,000 COLONIES/mL INSIGNIFICANT GROWTH Performed at [facility]    Report Status 09/04/2016 FINAL  Final  Blood Culture (routine x 2)     Status: Abnormal (Preliminary result)   Collection Time: 09/05/16  7:21 AM  Result Value Ref Range Status   Specimen Description BLOOD RIGHT FOREARM  Final   Special Requests BOTTLES DRAWN AEROBIC AND ANAEROBIC 6CC  Final   Culture  Setup Time   Final    Performed at [place] Gram Stain Report Called to,Read Back By and Verified With: STONE,B AT 1930 ON 09/05/2016 BY ISLEY,B Organism ID to follow CRITICAL RESULT CALLED TO, READ BACK BY AND VERIFIED WITH: N LIGHTNER,RN '@[id number]'$  09/06/16 MKELLY,MLT    Culture (A)  Final    ESCHERICHIA COLI SUSCEPTIBILITIES TO FOLLOW Performed at [facility]    Report Status PENDING  Incomplete  Blood Culture ID Panel (Reflexed)     Status: Abnormal   Collection Time: 09/05/16  7:21 AM  Result Value Ref Range Status   Enterococcus species NOT DETECTED NOT DETECTED Final   Listeria monocytogenes NOT DETECTED NOT DETECTED Final   Staphylococcus species NOT DETECTED NOT DETECTED Final   Staphylococcus aureus NOT DETECTED NOT DETECTED Final   Streptococcus species NOT DETECTED NOT DETECTED Final   Streptococcus agalactiae NOT DETECTED NOT DETECTED Final   Streptococcus pneumoniae NOT DETECTED NOT DETECTED Final   Streptococcus pyogenes NOT DETECTED NOT DETECTED Final   Acinetobacter baumannii NOT DETECTED NOT DETECTED Final   Enterobacteriaceae species DETECTED (A) NOT DETECTED Final    Comment: CRITICAL RESULT CALLED TO, READ BACK BY AND VERIFIED WITH: N LIGHTNER,RN '@[id number]'$  09/06/16 MKELLY,MLT    Enterobacter cloacae complex NOT DETECTED NOT DETECTED Final   Escherichia coli DETECTED (A) NOT DETECTED Final    Comment: CRITICAL RESULT CALLED TO, READ BACK BY AND VERIFIED WITH: N LIGHTNER,RN '@0312'$  09/06/16 MKELLY,MLT    Klebsiella oxytoca NOT DETECTED NOT DETECTED Final   Klebsiella pneumoniae NOT DETECTED NOT DETECTED Final   Proteus species NOT DETECTED NOT DETECTED Final   Serratia marcescens NOT DETECTED NOT DETECTED Final   Carbapenem resistance NOT DETECTED NOT DETECTED Final   Haemophilus influenzae NOT DETECTED NOT DETECTED Final   Neisseria meningitidis NOT DETECTED NOT DETECTED Final   Pseudomonas aeruginosa NOT DETECTED NOT DETECTED Final   Candida albicans NOT DETECTED NOT DETECTED  Final   Candida glabrata NOT DETECTED NOT DETECTED Final   Candida krusei NOT DETECTED NOT DETECTED Final   Candida parapsilosis NOT DETECTED NOT DETECTED Final   Candida tropicalis NOT DETECTED NOT DETECTED Final    Comment: Performed at [facility]  Blood Culture (routine x 2)     Status: Abnormal (Preliminary result)   Collection Time: 09/05/16  7:25 AM  Result Value Ref Range Status   Specimen Description BLOOD RIGHT HAND  Final   Special Requests BOTTLES DRAWN AEROBIC AND ANAEROBIC 8CC  Final   Culture  Setup Time   Final    Performed at  [place] NEGATIVE RODS Gram Stain Report Called to,Read Back By and Verified With: STONE,B AT 1930 ON 09/05/2016 BY ISLEY,B    Culture ESCHERICHIA COLI (A)  Final   Report Status PENDING  Incomplete  Urine culture     Status: Abnormal   Collection Time: 09/05/16  1:10 PM  Result Value Ref Range Status   Specimen Description URINE, RANDOM  Final   Special Requests NONE  Final   Culture MULTIPLE SPECIES PRESENT, SUGGEST RECOLLECTION (A)  Final   Report Status 09/07/2016 FINAL  Final  Respiratory Panel by PCR     Status: None   Collection Time: 09/05/16  2:22 PM  Result Value Ref Range Status   Adenovirus NOT DETECTED NOT DETECTED Final   Coronavirus 229E NOT DETECTED NOT DETECTED Final   Coronavirus HKU1 NOT DETECTED NOT DETECTED Final   Coronavirus NL63 NOT DETECTED NOT DETECTED Final   Coronavirus OC43 NOT DETECTED NOT DETECTED Final   Metapneumovirus NOT DETECTED NOT DETECTED Final   Rhinovirus / Enterovirus NOT DETECTED NOT DETECTED Final   Influenza A NOT DETECTED NOT DETECTED Final   Influenza B NOT DETECTED NOT DETECTED Final   Parainfluenza Virus 1 NOT DETECTED NOT DETECTED Final   Parainfluenza Virus 2 NOT DETECTED NOT DETECTED Final   Parainfluenza Virus 3 NOT DETECTED NOT DETECTED Final   Parainfluenza Virus 4 NOT DETECTED NOT DETECTED Final   Respiratory Syncytial Virus NOT DETECTED NOT DETECTED Final    Bordetella pertussis NOT DETECTED NOT DETECTED Final   Chlamydophila pneumoniae NOT DETECTED NOT DETECTED Final   Mycoplasma pneumoniae NOT DETECTED NOT DETECTED Final    Comment: Performed at [facility]  Culture, blood (routine x 2)     Status: None (Preliminary result)   Collection Time: 09/07/16 11:58 AM  Result Value Ref Range Status   Specimen Description BLOOD RIGHT WRIST  Final   Special Requests BOTTLES DRAWN AEROBIC AND ANAEROBIC 6CC EACH  Final   Culture NO GROWTH < 24 HOURS  Final   Report Status PENDING  Incomplete  Culture, blood (routine x 2)     Status: None (Preliminary result)   Collection Time: 09/07/16 12:08 PM  Result Value Ref Range Status   Specimen Description BLOOD RIGHT HAND  Final   Special Requests BOTTLES DRAWN AEROBIC ONLY 4CC ONLY  Final   Culture NO GROWTH < 24 HOURS  Final   Report Status PENDING  Incomplete         Radiology Studies: Ct Abdomen Pelvis Wo Contrast  Result Date: 09/07/2016 CLINICAL DATA:  66 year old male with history of sepsis due to E coli bacteremia. Pneumonia. Acute on chronic kidney disease. EXAM: CT ABDOMEN AND PELVIS WITHOUT CONTRAST TECHNIQUE: Multidetector CT imaging of the abdomen and pelvis was performed following the standard protocol without IV contrast. COMPARISON:  CT the abdomen and pelvis 09/03/2016. FINDINGS: Lower chest: Multiple new areas of consolidation and volume loss throughout the lung bases bilaterally, most severe in the lower lobes (right greater than left), concerning for sequela of recent aspiration. Trace bilateral pleural effusions. Hepatobiliary: New compared to the prior examination is a subhepatic fluid collection and beneath the right lobe of the liver which is likely to be subcapsular. This is best appreciated on sagittal image 31 of series 6 and coronal image 56 of series 5 where it measures 10.6 x 3.8 x 8.6 cm. No other definite intrahepatic lesions are identified on today's noncontrast CT  examination. Small focus of intermediate to high attenuation  lying dependently in the gallbladder likely represents a noncalcified gallstone. Small amount of pericholecystic fluid. Haziness and inflammatory changes in the soft tissues surrounding the gallbladder. Gallbladder appears moderately distended. Pancreas: No definite pancreatic mass or peripancreatic inflammatory changes are noted on today's noncontrast CT examination. Spleen: Unremarkable. Adrenals/Urinary Tract: 4.5 cm low-attenuation lesion in the lateral aspect of the interpolar region of the left kidney is incompletely characterized, but previously characterized as a simple cyst on prior study 05/21/2011. Right kidney and bilateral adrenal glands are normal in appearance. No hydroureteronephrosis. Urinary bladder is nearly completely decompressed around an indwelling Foley balloon catheter. Small amount of gas non dependently in the lumen of the urinary bladder is iatrogenic. Stomach/Bowel: Unenhanced appearance of the stomach is normal. There is no pathologic dilatation of small bowel or colon. Small duodenal diverticulum extending off the medial aspect of the second portion of the duodenum incidentally noted. No surrounding inflammatory changes to suggest an associated diverticulitis at this time. There also a few scattered colonic diverticulae, also without surrounding inflammatory changes to suggest an acute diverticulitis at this time. Normal appendix. Vascular/Lymphatic: Atherosclerotic calcifications in the abdominal aorta (mild), without evidence of aneurysm. No lymphadenopathy noted in the abdomen or pelvis on today's noncontrast CT examination. Reproductive: Prostate gland seminal vesicles are unremarkable in appearance. Other: Moderate sized left inguinal hernia containing only fat. No significant volume of ascites. No pneumoperitoneum. Musculoskeletal: There are no aggressive appearing lytic or blastic lesions noted in the visualized  portions of the skeleton. IMPRESSION: 1. Interval development of extensive pericholecystic fluid. There is a small noncalcified gallstone or sludge ball near the neck of the gallbladder. Notably, there are also inflammatory changes around the gallbladder and the pericholecystic fluid appears contiguous with a subhepatic fluid collection beneath the right lobe of the liver, which appears to be likely subcapsular in location. Clinical correlation for signs and symptoms of acute cholecystitis is strongly recommended. 2. The appearance of the lower thorax suggests sequela of recent aspiration with multifocal aspiration pneumonitis/pneumonia. 3. Aortic atherosclerosis. 4. Additional incidental findings, as above. These results will be called to the ordering clinician or representative by the Radiologist Assistant, and communication documented in the PACS or zVision Dashboard. Electronically Signed   By: Vinnie Langton M.D.   On: 09/07/2016 16:52   Ct Chest Wo Contrast  Result Date: 09/07/2016 CLINICAL DATA:  66 year old male with hypoxia, fever and right-sided chest pain. Evaluate for pneumonia. EXAM: CT CHEST WITHOUT CONTRAST TECHNIQUE: Multidetector CT imaging of the chest was performed following the standard protocol without IV contrast. COMPARISON:  Chest x-ray obtained earlier today ; prior CT scan of the abdomen and pelvis 09/03/2016 FINDINGS: Cardiovascular: Limited evaluation in the absence of intravenous contrast. Conventional 3 vessel arch anatomy. No evidence of aneurysm. The heart is normal in size. No pericardial effusion. The main pulmonary artery is borderline enlarged at 3.2 cm. Mediastinum/Nodes: Unremarkable CT appearance of the thyroid gland. No suspicious mediastinal or hilar adenopathy. No soft tissue mediastinal mass. The thoracic esophagus is unremarkable. Lungs/Pleura: Multifocal atelectasis involving the right middle and bilateral lower lobes. The degree of opacification of the right lower  lobe is slightly greater than expected for atelectasis and a small amount of superimposed consolidation is difficult to exclude entirely. There is respiratory motion artifact which slightly limits evaluation. Trace left-sided pleural effusion. Upper Abdomen: Incompletely imaged gallbladder distension. Musculoskeletal: No acute fracture or aggressive appearing lytic or blastic osseous lesion. IMPRESSION: 1. Pulmonary findings are most consistent with multifocal atelectasis involving the right middle,  right lower and left lower lobes. The degree of opacity in the right lower lobe is slightly greater than expected for the amount of volume loss and therefore a superimposed infiltrate/pneumonia is difficult to exclude radiographically. 2. Trace left pleural effusion. 3. Borderline enlarged main pulmonary artery. Query clinical history of pulmonary arterial hypertension? 4. Incompletely imaged gallbladder appears distended. Does the patient have right upper quadrant pain? If there is clinical concern for cholecystitis, consider right upper quadrant ultrasound. Electronically Signed   By: Jacqulynn Cadet M.D.   On: 09/07/2016 14:10   Dg Chest Port 1 View  Result Date: 09/07/2016 CLINICAL DATA:  Hypoxia EXAM: PORTABLE CHEST 1 VIEW COMPARISON:  Chest radiograph and chest CT September 07, 2016 FINDINGS: There is persistent patchy airspace consolidation in the left mid lung and right base regions. There is no evident new opacity compared to earlier in the day. Heart is mildly enlarged with pulmonary vascularity within normal limits. There is atherosclerotic calcification in the aorta. There is stable eventration of the right hemidiaphragm. No adenopathy. No bone lesions. IMPRESSION: Areas of airspace consolidation in the left mid lung and right base regions. No new opacity. Stable cardiac silhouette. There is aortic atherosclerosis. Electronically Signed   By: Lowella Grip III M.D.   On: 09/07/2016 21:45   Dg Chest  Port 1 View  Result Date: 09/07/2016 CLINICAL DATA:  Fever, hypoxia.  Subsequent encounter. EXAM: PORTABLE CHEST 1 VIEW COMPARISON:  09/05/2016 FINDINGS: There is opacity at the right lung base which is similar to the prior exam allowing for differences in patient positioning and technique. Mild linear scarring or atelectasis in the left mid lung, stable. Remainder of the lungs is clear. Elevation right hemidiaphragm is stable. No obvious pleural effusion. No pneumothorax. IMPRESSION: 1. Right base opacity is similar to the prior exam. Although this may be atelectasis only, pneumonia should be considered likely given the symptoms of fever and hypoxia. 2. No significant change from the prior exam allowing for differences in positioning and technique. Electronically Signed   By: Lajean Manes M.D.   On: 09/07/2016 09:18        Scheduled Meds: . aspirin EC  81 mg Oral Daily  . cholecalciferol  1,000 Units Oral Daily  . cloNIDine  0.2 mg Oral Daily  . diltiazem  180 mg Oral Daily  . insulin aspart  0-15 Units Subcutaneous TID WC  . insulin aspart  0-5 Units Subcutaneous QHS  . insulin aspart  4 Units Subcutaneous TID WC  . insulin glargine  40 Units Subcutaneous q morning - 10a  . ipratropium  0.5 mg Nebulization Q6H  . levalbuterol  0.63 mg Nebulization Q6H  .  morphine injection  1 mg Intravenous Once  . pantoprazole (PROTONIX) IV  40 mg Intravenous Q24H  . piperacillin-tazobactam (ZOSYN)  IV  3.375 g Intravenous Q8H  . potassium chloride  20 mEq Oral Daily   Continuous Infusions: . sodium chloride       LOS: 3 days    Reyne Dumas, MD [facility] Pager [phone]  If 7PM-7AM, please contact night-coverage [email or web address] Password [facility] 09/08/2016, 8:32 AM

## 2016-09-09 ENCOUNTER — Inpatient Hospital Stay (HOSPITAL_COMMUNITY): Payer: PPO

## 2016-09-09 DIAGNOSIS — I4891 Unspecified atrial fibrillation: Secondary | ICD-10-CM

## 2016-09-09 LAB — COMPREHENSIVE METABOLIC PANEL
ALBUMIN: 1.7 g/dL — AB (ref 3.5–5.0)
ALT: 47 U/L (ref 17–63)
ANION GAP: 11 (ref 5–15)
AST: 57 U/L — ABNORMAL HIGH (ref 15–41)
Alkaline Phosphatase: 128 U/L — ABNORMAL HIGH (ref 38–126)
BUN: 65 mg/dL — ABNORMAL HIGH (ref 6–20)
CO2: 17 mmol/L — AB (ref 22–32)
Calcium: 8 mg/dL — ABNORMAL LOW (ref 8.9–10.3)
Chloride: 106 mmol/L (ref 101–111)
Creatinine, Ser: 6.67 mg/dL — ABNORMAL HIGH (ref 0.61–1.24)
GFR calc non Af Amer: 8 mL/min — ABNORMAL LOW (ref >60)
GFR, EST AFRICAN AMERICAN: 9 mL/min — AB (ref >60)
GLUCOSE: 376 mg/dL — AB (ref 65–99)
POTASSIUM: 5 mmol/L (ref 3.5–5.1)
SODIUM: 134 mmol/L — AB (ref 135–145)
Total Bilirubin: 1.2 mg/dL (ref 0.3–1.2)
Total Protein: 6.4 g/dL — ABNORMAL LOW (ref 6.5–8.1)

## 2016-09-09 LAB — BASIC METABOLIC PANEL
Anion gap: 14 (ref 5–15)
BUN: 80 mg/dL — AB (ref 6–20)
CHLORIDE: 105 mmol/L (ref 101–111)
CO2: 16 mmol/L — AB (ref 22–32)
Calcium: 8 mg/dL — ABNORMAL LOW (ref 8.9–10.3)
Creatinine, Ser: 7.05 mg/dL — ABNORMAL HIGH (ref 0.61–1.24)
GFR calc Af Amer: 8 mL/min — ABNORMAL LOW (ref >60)
GFR calc non Af Amer: 7 mL/min — ABNORMAL LOW (ref >60)
GLUCOSE: 410 mg/dL — AB (ref 65–99)
POTASSIUM: 4.2 mmol/L (ref 3.5–5.1)
Sodium: 135 mmol/L (ref 135–145)

## 2016-09-09 LAB — GLUCOSE, CAPILLARY
GLUCOSE-CAPILLARY: 379 mg/dL — AB (ref 65–99)
GLUCOSE-CAPILLARY: 415 mg/dL — AB (ref 65–99)
GLUCOSE-CAPILLARY: 351 mg/dL — AB (ref 65–99)
GLUCOSE-CAPILLARY: 331 mg/dL — AB (ref 65–99)
Glucose-Capillary: 431 mg/dL — ABNORMAL HIGH (ref 65–99)
Glucose-Capillary: 446 mg/dL — ABNORMAL HIGH (ref 65–99)
Glucose-Capillary: 436 mg/dL — ABNORMAL HIGH (ref 65–99)
Glucose-Capillary: 401 mg/dL — ABNORMAL HIGH (ref 65–99)

## 2016-09-09 LAB — CREATININE, URINE, RANDOM: CREATININE, URINE: 36.88 mg/dL

## 2016-09-09 LAB — CBC
HEMATOCRIT: 32.2 % — AB (ref 39.0–52.0)
HEMOGLOBIN: 10.6 g/dL — AB (ref 13.0–17.0)
MCH: 26.9 pg (ref 26.0–34.0)
MCHC: 32.9 g/dL (ref 30.0–36.0)
MCV: 81.7 fL (ref 78.0–100.0)
Platelets: 180 10*3/uL (ref 150–400)
RBC: 3.94 MIL/uL — ABNORMAL LOW (ref 4.22–5.81)
RDW: 16.6 % — ABNORMAL HIGH (ref 11.5–15.5)
WBC: 11.4 10*3/uL — ABNORMAL HIGH (ref 4.0–10.5)

## 2016-09-09 LAB — TROPONIN I
TROPONIN I: 0.04 ng/mL — AB (ref <0.03)
TROPONIN I: 0.15 ng/mL — AB (ref <0.03)
Troponin I: 0.04 ng/mL (ref <0.03)

## 2016-09-09 LAB — MRSA PCR SCREENING: MRSA by PCR: NEGATIVE

## 2016-09-09 LAB — SODIUM, URINE, RANDOM: Sodium, Ur: 93 mmol/L

## 2016-09-09 LAB — TSH: TSH: 0.457 u[IU]/mL (ref 0.350–4.500)

## 2016-09-09 LAB — MAGNESIUM: MAGNESIUM: 2.3 mg/dL (ref 1.7–2.4)

## 2016-09-09 LAB — T4, FREE: FREE T4: 0.76 ng/dL (ref 0.61–1.12)

## 2016-09-09 MED ORDER — SODIUM POLYSTYRENE SULFONATE 15 GM/60ML PO SUSP
15.0000 g | Freq: Once | ORAL | Status: AC
  Administered 2016-09-09: 15 g via ORAL
  Filled 2016-09-09: qty 60

## 2016-09-09 MED ORDER — FUROSEMIDE 10 MG/ML IJ SOLN
80.0000 mg | Freq: Once | INTRAMUSCULAR | Status: AC
  Administered 2016-09-09: 80 mg via INTRAVENOUS
  Filled 2016-09-09: qty 8

## 2016-09-09 MED ORDER — INSULIN GLARGINE 100 UNIT/ML ~~LOC~~ SOLN
25.0000 [IU] | Freq: Every day | SUBCUTANEOUS | Status: DC
  Administered 2016-09-09: 25 [IU] via SUBCUTANEOUS
  Filled 2016-09-09: qty 0.25

## 2016-09-09 MED ORDER — INSULIN REGULAR BOLUS VIA INFUSION
0.0000 [IU] | Freq: Three times a day (TID) | INTRAVENOUS | Status: DC
Start: 2016-09-10 — End: 2016-09-10
  Filled 2016-09-09: qty 10

## 2016-09-09 MED ORDER — DEXTROSE 50 % IV SOLN: 25.0000 mL | INTRAVENOUS | Status: DC | PRN

## 2016-09-09 MED ORDER — METHYLPREDNISOLONE SODIUM SUCC 40 MG IJ SOLR
40.0000 mg | Freq: Two times a day (BID) | INTRAMUSCULAR | Status: DC
  Administered 2016-09-09 (×2): 40 mg via INTRAVENOUS
  Filled 2016-09-09 (×2): qty 1

## 2016-09-09 MED ORDER — DEXTROSE-NACL 5-0.45 % IV SOLN: INTRAVENOUS | Status: DC

## 2016-09-09 MED ORDER — SODIUM CHLORIDE 0.9 % IV SOLN: INTRAVENOUS | Status: DC

## 2016-09-09 MED ORDER — SODIUM CHLORIDE 0.9 % IV SOLN
INTRAVENOUS | Status: DC
  Administered 2016-09-09: 3.8 [IU]/h via INTRAVENOUS
  Administered 2016-09-09: 3.4 [IU]/h via INTRAVENOUS
  Filled 2016-09-09: qty 2.5

## 2016-09-09 MED ORDER — INSULIN GLARGINE 100 UNIT/ML ~~LOC~~ SOLN
40.0000 [IU] | Freq: Every morning | SUBCUTANEOUS | Status: DC
  Administered 2016-09-09: 40 [IU] via SUBCUTANEOUS
  Filled 2016-09-09: qty 0.4

## 2016-09-09 MED FILL — Hydromorphone HCl Inj 4 MG/ML: INTRAMUSCULAR | Qty: 1 | Status: AC

## 2016-09-09 NOTE — Consult Note (Signed)
Patient ID: Gary Lang MRN: [id number], DOB/AGE: 04-25-1951   Admit date: 09/05/2016   Reason for Consult: New Onset Atrial Fibrillation  Requesting MD: Dr. Allyson Sabal, Internal Medicine    Primary Physician: [name], MD Primary Cardiologist: New  Pt. Profile:  66 y/o male with h/o CKD, RA, DM and HTN, who presented on 09/05/16 with complaint of right sided chest and RUQ abdominal pain, fever and chills, found to have right sided PNA and acute cholecystitis, now s/p percutaneous cholecystostomy. Also being treated for Sepsis due to E coli bacteremia. Hospitalization complicated by new onset atrial fibrillation.   Problem List  Past Medical History:  Diagnosis Date  . Chronic kidney disease   . Diabetes mellitus without complication ([place])   . Hypertension   . Rheumatoid arthritis ([place])   . Vitamin D deficiency    Hx: of    Past Surgical History:  Procedure Laterality Date  . AV FISTULA PLACEMENT Left 06/28/2013   Procedure: ARTERIOVENOUS (AV) FISTULA CREATION- Left arm with ultrasound guidance;  Surgeon: Rosetta Posner, MD;  Location: [facility] OR;  Service: Vascular;  Laterality: Left;  . COLONOSCOPY     hx: of  . HERNIA REPAIR    . INSERTION OF DIALYSIS CATHETER N/A 06/28/2013   Procedure: INSERTION OF DIALYSIS CATHETER;  Surgeon: Rosetta Posner, MD;  Location: [facility];  Service: Vascular;  Laterality: N/A;  . IR GENERIC HISTORICAL  09/08/2016   IR PERC CHOLECYSTOSTOMY 09/08/2016 [name], MD [facility]-INTERV RAD  . LIGATION OF COMPETING BRANCHES OF ARTERIOVENOUS FISTULA Left 10/12/2013   Procedure: LIGATION OF COMPETING BRANCHES OF ARTERIOVENOUS FISTULA;  Surgeon: Elam Dutch, MD;  Location: [facility] OR;  Service: Vascular;  Laterality: Left;  . neck surgery       Allergies  Allergies  Allergen Reactions  . Glyburide Other (See Comments)    unknown  . Methotrexate Derivatives Other (See Comments)    Patient cannot remember    HPI  66 y/o male with h/o CKD, RA, DM and HTN, who  presented on 09/05/16 with complaint of right sided chest and abdominal pain, fever and chills, found to have right sided PNA and acute cholecystitis, now s/p percutaneous cholecystostomy. Also initially felt to be septic with blood cultures showing GNRs c/w E. Coli. His hospitalization has been further complicated by new onset atrial fibrillation.   He is being treated with antibiotics. Per general surgery, he is not a good surgical candidate, at this time, given active PNA. Percutaneous chole drain was placed by IR 09/08/16. He is tolerating clears and had a BM yesterday. WBC is 11.4. Hgb 10.6. Sodium 134. SCr 6.67. Troponin 0.04. Per surgery, he can be reconsidered for laparoscopic cholecystectomy once he has fully recovered from his PNA.   The patient remains in atrial fibrillation on telemetry. HR is controlled with IV Cardizem. HR is currently in the 80s. He is asymptomatic. Patient notes that he has always had an irregular heart beat. He reports he was told this at the age of 5 and he reports this has continued into adulthood. He states that he has not been followed by a cardiologist but followed by a PCP. He denies ever being diagnosed with atrial fibrillation/flutter, that he is aware of. He has never been ssymptomatic. He denies palpitations, anginal like chest pain, dyspnea, dizziness, syncope/ near syncope. He denies any prior h/o TIA/CVA. He has a h/o DM and HTN. He reports that he was on HD for several years but was able  to stop 5 years ago.  He walks for exercise, almost daily, and denies any exertional CP or dyspnea.   He reports significant improvement in symptoms since treatment with antibiotics and perc drain.   Home Medications  Prior to Admission medications   Medication Sig Start Date End Date Taking? Authorizing Provider  acetaminophen (TYLENOL) 500 MG tablet Take 2 tablets (1,000 mg total) by mouth 3 (three) times daily as needed for pain. 02/07/12  Yes Zenia Resides, MD  aspirin  81 MG EC tablet Take 1 tablet (81 mg total) by mouth daily. Swallow whole. 02/07/12  Yes Zenia Resides, MD  atorvastatin (LIPITOR) 40 MG tablet Take 1 tablet by mouth daily. 08/13/16  Yes Historical Provider, MD  cholecalciferol (VITAMIN D) 1000 units tablet Take 1,000 Units by mouth daily.   Yes Historical Provider, MD  cloNIDine (CATAPRES) 0.2 MG tablet Take 0.2 mg by mouth daily.   Yes Historical Provider, MD  dicyclomine (BENTYL) 20 MG tablet Take 1 tablet (20 mg total) by mouth 2 (two) times daily as needed for spasms. 09/03/16  Yes Merryl Hacker, MD  diltiazem (DILACOR XR) 180 MG 24 hr capsule Take 1 capsule (180 mg total) by mouth daily. 08/05/13  Yes Waldemar Dickens, MD  furosemide (LASIX) 80 MG tablet Take 80 mg by mouth daily.    Yes Historical Provider, MD  insulin aspart (NOVOLOG FLEXPEN) 100 UNIT/ML FlexPen Inject 7.5 Units into the skin 3 (three) times daily with meals. Patient taking differently: Inject 18 Units into the skin 3 (three) times daily with meals.  01/31/14  Yes Waldemar Dickens, MD  Insulin Glargine (LANTUS) 100 UNIT/ML Solostar Pen Inject 20 Units into the skin every morning. Increase 5 u if am CBG is greater than 200.  Decrease 5 u if am CBG is less than 150 Patient taking differently: Inject 40 Units into the skin every morning. Increase 5 u if am CBG is greater than 200.  Decrease 5 u if am CBG is less than 150 09/05/14  Yes Aquilla Hacker, MD  loperamide (IMODIUM) 2 MG capsule Take 1 capsule (2 mg total) by mouth 4 (four) times daily as needed for diarrhea or loose stools. 09/03/16  Yes Merryl Hacker, MD  Omega-3 Fatty Acids (FISH OIL PO) Take 1 capsule by mouth daily.   Yes Historical Provider, MD  ondansetron (ZOFRAN ODT) 4 MG disintegrating tablet Take 1 tablet (4 mg total) by mouth every 8 (eight) hours as needed for nausea or vomiting. 09/03/16  Yes Merryl Hacker, MD  sildenafil (VIAGRA) 50 MG tablet Take 0.5-1 tablets (25-50 mg total) by mouth daily as needed  for erectile dysfunction. 02/01/14  Yes Waldemar Dickens, MD  TRULICITY 1.5 SW/5.4OE SOPN Inject 1 Units as directed once a week. Patient isn't sure how many units he takes. Patient takes on Wednesday's. 08/13/16  Yes Historical Provider, MD  Insulin Pen Needle (PEN NEEDLES) 31G X 6 MM MISC Use 1 needle per injection as directed 07/01/13   Patrecia Pour, MD  UNIFINE PENTIPS 31G X 5 MM MISC USE AS DIRECTED WITH INSULIN    Waldemar Dickens, MD    Family History  Family History  Problem Relation Age of Onset  . Cancer Mother   . Arthritis/Rheumatoid Sister     Social History  Social History   Social History  . Marital status: Married    Spouse name: N/A  . Number of children: N/A  . Years of  education: N/A   Occupational History  . Not on file.   Social History Main Topics  . Smoking status: Former Smoker    Packs/day: 0.50    Years: 10.00    Types: Cigarettes    Quit date: 09/02/1976  . Smokeless tobacco: Never Used  . Alcohol use No  . Drug use: No  . Sexual activity: Not on file   Other Topics Concern  . Not on file   Social History Narrative  . No narrative on file     Review of Systems General:  No chills, fever, night sweats or weight changes.  Cardiovascular:  No chest pain, dyspnea on exertion, edema, orthopnea, palpitations, paroxysmal nocturnal dyspnea. Dermatological: No rash, lesions/masses Respiratory: No cough, dyspnea Urologic: No hematuria, dysuria Abdominal:   No nausea, vomiting, diarrhea, bright red blood per rectum, melena, or hematemesis Neurologic:  No visual changes, wkns, changes in mental status. All other systems reviewed and are otherwise negative except as noted above.  Physical Exam  Blood pressure 129/65, pulse (!) 108, temperature 97.9 F (36.6 C), temperature source Oral, resp. rate 20, height '6\' 1"'$  (1.854 m), weight 245 lb 9.5 oz (111.4 kg), SpO2 96 %.  General: Pleasant, NAD Psych: Normal affect. Neuro: Alert and oriented X 3. Moves  all extremities spontaneously. HEENT: Normal  Neck: Supple without bruits or JVD. Lungs:  Resp regular and unlabored, CTA. Heart: RRR no s3, s4, or murmurs. Abdomen: Soft, non-tender, non-distended, BS + x 4.  Extremities: No clubbing, cyanosis or edema. DP/PT/Radials 2+ and equal bilaterally.  Labs  Troponin (Point of Care Test) No results for input(s): TROPIPOC in the last 72 hours.  Recent Labs  09/09/16 0938  TROPONINI 0.04*   Lab Results  Component Value Date   WBC 11.4 (H) 09/09/2016   HGB 10.6 (L) 09/09/2016   HCT 32.2 (L) 09/09/2016   MCV 81.7 09/09/2016   PLT 180 09/09/2016    Recent Labs Lab 09/09/16 0340  NA 134*  K 5.0  CL 106  CO2 17*  BUN 65*  CREATININE 6.67*  CALCIUM 8.0*  PROT 6.4*  BILITOT 1.2  ALKPHOS 128*  ALT 47  AST 57*  GLUCOSE 376*   Lab Results  Component Value Date   CHOL 325 (H) 06/19/2013   HDL 46 06/19/2013   LDLCALC 250 (H) 06/19/2013   TRIG 145 06/19/2013   No results found for: DDIMER   Radiology/Studies  Ct Abdomen Pelvis Wo Contrast  Result Date: 09/07/2016 CLINICAL DATA:  66 year old male with history of sepsis due to E coli bacteremia. Pneumonia. Acute on chronic kidney disease. EXAM: CT ABDOMEN AND PELVIS WITHOUT CONTRAST TECHNIQUE: Multidetector CT imaging of the abdomen and pelvis was performed following the standard protocol without IV contrast. COMPARISON:  CT the abdomen and pelvis 09/03/2016. FINDINGS: Lower chest: Multiple new areas of consolidation and volume loss throughout the lung bases bilaterally, most severe in the lower lobes (right greater than left), concerning for sequela of recent aspiration. Trace bilateral pleural effusions. Hepatobiliary: New compared to the prior examination is a subhepatic fluid collection and beneath the right lobe of the liver which is likely to be subcapsular. This is best appreciated on sagittal image 31 of series 6 and coronal image 56 of series 5 where it measures 10.6 x 3.8 x  8.6 cm. No other definite intrahepatic lesions are identified on today's noncontrast CT examination. Small focus of intermediate to high attenuation lying dependently in the gallbladder likely represents a noncalcified gallstone. Small amount  of pericholecystic fluid. Haziness and inflammatory changes in the soft tissues surrounding the gallbladder. Gallbladder appears moderately distended. Pancreas: No definite pancreatic mass or peripancreatic inflammatory changes are noted on today's noncontrast CT examination. Spleen: Unremarkable. Adrenals/Urinary Tract: 4.5 cm low-attenuation lesion in the lateral aspect of the interpolar region of the left kidney is incompletely characterized, but previously characterized as a simple cyst on prior study 05/21/2011. Right kidney and bilateral adrenal glands are normal in appearance. No hydroureteronephrosis. Urinary bladder is nearly completely decompressed around an indwelling Foley balloon catheter. Small amount of gas non dependently in the lumen of the urinary bladder is iatrogenic. Stomach/Bowel: Unenhanced appearance of the stomach is normal. There is no pathologic dilatation of small bowel or colon. Small duodenal diverticulum extending off the medial aspect of the second portion of the duodenum incidentally noted. No surrounding inflammatory changes to suggest an associated diverticulitis at this time. There also a few scattered colonic diverticulae, also without surrounding inflammatory changes to suggest an acute diverticulitis at this time. Normal appendix. Vascular/Lymphatic: Atherosclerotic calcifications in the abdominal aorta (mild), without evidence of aneurysm. No lymphadenopathy noted in the abdomen or pelvis on today's noncontrast CT examination. Reproductive: Prostate gland seminal vesicles are unremarkable in appearance. Other: Moderate sized left inguinal hernia containing only fat. No significant volume of ascites. No pneumoperitoneum. Musculoskeletal:  There are no aggressive appearing lytic or blastic lesions noted in the visualized portions of the skeleton. IMPRESSION: 1. Interval development of extensive pericholecystic fluid. There is a small noncalcified gallstone or sludge ball near the neck of the gallbladder. Notably, there are also inflammatory changes around the gallbladder and the pericholecystic fluid appears contiguous with a subhepatic fluid collection beneath the right lobe of the liver, which appears to be likely subcapsular in location. Clinical correlation for signs and symptoms of acute cholecystitis is strongly recommended. 2. The appearance of the lower thorax suggests sequela of recent aspiration with multifocal aspiration pneumonitis/pneumonia. 3. Aortic atherosclerosis. 4. Additional incidental findings, as above. These results will be called to the ordering clinician or representative by the Radiologist Assistant, and communication documented in the PACS or zVision Dashboard. Electronically Signed   By: Vinnie Langton M.D.   On: 09/07/2016 16:52   Ct Abdomen Pelvis Wo Contrast  Result Date: 09/03/2016 CLINICAL DATA:  Severe abdominal pain for 8 hours, cramping. Vomiting. History of hypertension, diabetes, cholecystectomy and appendectomy. EXAM: CT ABDOMEN AND PELVIS WITHOUT CONTRAST TECHNIQUE: Multidetector CT imaging of the abdomen and pelvis was performed following the standard protocol without IV contrast. Oral contrast administered. COMPARISON:  Abdominal radiographs September 02, 2016 and CT abdomen and pelvis May 21, 2011 FINDINGS: LOWER CHEST: Dependent atelectasis. The visualized heart size is normal. No pericardial effusion. Contrast in the distal esophagus may be retained or refluxed. HEPATOBILIARY: Normal. PANCREAS: Normal. SPLEEN: Normal. ADRENALS/URINARY TRACT: Kidneys are orthotopic, demonstrating normal size and morphology. No nephrolithiasis, hydronephrosis; limited assessment for renal masses on this nonenhanced  examination. 4.2 cm exophytic LEFT upper pole renal cyst. The unopacified ureters are normal in course and caliber. Urinary bladder is partially distended and unremarkable. Normal adrenal glands. STOMACH/BOWEL: The stomach, small and large bowel are normal in course and caliber without inflammatory changes. Proximal duodenum diverticulum. Moderate colonic diverticulosis. Scattered small bowel diverticula noted in the RIGHT abdomen. Normal appendix. VASCULAR/LYMPHATIC: Aortoiliac vessels are normal in course and caliber, trace calcific atherosclerosis. No lymphadenopathy by CT size criteria. REPRODUCTIVE: Mild prostatomegaly. OTHER: No intraperitoneal free fluid or free air. MUSCULOSKELETAL: Non-acute. Large fat containing LEFT inguinal hernia.  Status post RIGHT inguinal herniorrhaphy. Small fat containing umbilical hernia. Degenerative change of lumbar spine resulting in moderate L5-S1 neural foraminal narrowing. IMPRESSION: No acute intra-abdominal or pelvic process. Small large bowel diverticulosis. Electronically Signed   By: Elon Alas M.D.   On: 09/03/2016 02:29   Dg Chest 2 View  Result Date: 09/05/2016 CLINICAL DATA:  66 year old male with right upper quadrant pain, shortness of breath and generalized weakness for the past 4 days EXAM: CHEST  2 VIEW COMPARISON:  Prior chest x-ray 10/12/2013 FINDINGS: Interval removal of right IJ approach tunneled hemodialysis catheter. New patchy right lower lobe airspace opacity predominantly in the lower lobe. Additionally, there is atelectasis in the right middle lobe. Suspect a small layering pleural effusion as well. Stable borderline cardiomegaly. Probable trace chronic left pleural effusion. Mild vascular congestion without overt edema. No acute osseous abnormality. IMPRESSION: 1. New patchy airspace opacity in the right lower lobe concerning for pneumonia. Followup PA and lateral chest X-ray is recommended in 3-4 weeks following trial of antibiotic therapy  to ensure resolution and exclude underlying malignancy. 2. Right middle lobe atelectasis. 3. Small right pleural effusion is likely parapneumonic. 4. Stable cardiomegaly and mild vascular congestion without overt edema. 5. Similar appearance of chronic small left pleural effusion. Electronically Signed   By: Jacqulynn Cadet M.D.   On: 09/05/2016 08:27   Dg Abdomen 1 View  Result Date: 09/02/2016 CLINICAL DATA:  Nausea and vomiting and generalized abdominal pain and cramping starting this evening. EXAM: ABDOMEN - 1 VIEW COMPARISON:  CT from 05/21/2011 FINDINGS: Moderate colonic stool burden within large bowel. No bowel obstruction or free air. No organomegaly. Phleboliths are seen in the pelvis. L4-5 and L5-S1 facet arthropathy. IMPRESSION: Moderate colonic stool burden.  No bowel obstruction. Electronically Signed   By: Ashley Royalty M.D.   On: 09/02/2016 23:47   Ct Chest Wo Contrast  Result Date: 09/07/2016 CLINICAL DATA:  66 year old male with hypoxia, fever and right-sided chest pain. Evaluate for pneumonia. EXAM: CT CHEST WITHOUT CONTRAST TECHNIQUE: Multidetector CT imaging of the chest was performed following the standard protocol without IV contrast. COMPARISON:  Chest x-ray obtained earlier today ; prior CT scan of the abdomen and pelvis 09/03/2016 FINDINGS: Cardiovascular: Limited evaluation in the absence of intravenous contrast. Conventional 3 vessel arch anatomy. No evidence of aneurysm. The heart is normal in size. No pericardial effusion. The main pulmonary artery is borderline enlarged at 3.2 cm. Mediastinum/Nodes: Unremarkable CT appearance of the thyroid gland. No suspicious mediastinal or hilar adenopathy. No soft tissue mediastinal mass. The thoracic esophagus is unremarkable. Lungs/Pleura: Multifocal atelectasis involving the right middle and bilateral lower lobes. The degree of opacification of the right lower lobe is slightly greater than expected for atelectasis and a small amount of  superimposed consolidation is difficult to exclude entirely. There is respiratory motion artifact which slightly limits evaluation. Trace left-sided pleural effusion. Upper Abdomen: Incompletely imaged gallbladder distension. Musculoskeletal: No acute fracture or aggressive appearing lytic or blastic osseous lesion. IMPRESSION: 1. Pulmonary findings are most consistent with multifocal atelectasis involving the right middle, right lower and left lower lobes. The degree of opacity in the right lower lobe is slightly greater than expected for the amount of volume loss and therefore a superimposed infiltrate/pneumonia is difficult to exclude radiographically. 2. Trace left pleural effusion. 3. Borderline enlarged main pulmonary artery. Query clinical history of pulmonary arterial hypertension? 4. Incompletely imaged gallbladder appears distended. Does the patient have right upper quadrant pain? If there is clinical concern for  cholecystitis, consider right upper quadrant ultrasound. Electronically Signed   By: Jacqulynn Cadet M.D.   On: 09/07/2016 14:10   Ir Perc Cholecystostomy  Result Date: 09/08/2016 INDICATION: ACUTE CHOLECYSTITIS, NON OPERATIVE CANDIDATE EXAM: CHOLECYSTOSTOMY MEDICATIONS: 3.375 G ZOSYN; The antibiotic was administered within an appropriate time frame prior to the initiation of the procedure. ANESTHESIA/SEDATION: Moderate (conscious) sedation was employed during this procedure. A total of Versed 1.0 mg and Fentanyl 25 mcg was administered intravenously. Moderate Sedation Time: 10 minutes. The patient's level of consciousness and vital signs were monitored continuously by radiology nursing throughout the procedure under my direct supervision. FLUOROSCOPY TIME:  Fluoroscopy Time:  48 seconds (10 mGy). COMPLICATIONS: None immediate. PROCEDURE: Informed written consent was obtained from the patient after a thorough discussion of the procedural risks, benefits and alternatives. All questions were  addressed. Maximal Sterile Barrier Technique was utilized including caps, mask, sterile gowns, sterile gloves, sterile drape, hand hygiene and skin antiseptic. A timeout was performed prior to the initiation of the procedure. Previous imaging reviewed. Preliminary ultrasound performed. The abnormal gallbladder was localized in the right upper quadrant through a lower intercostal space. Overlying skin marked. Under sterile conditions and local anesthesia, percutaneous transhepatic needle access performed of the gallbladder under direct ultrasound. Needle position confirmed with ultrasound. There was return of bile. Guidewire inserted followed by the Accustick dilator set. Amplatz guidewire exchange performed. Tract dilatation performed to insert a 10 [place] drain. Drain catheter position confirmed with ultrasound and fluoroscopy. There was return of exudative bile. Sample sent for Gram stain and culture. Catheter secured with a Prolene suture and connected to external gravity drainage. Sterile dressing applied. No immediate complication. IMPRESSION: Successful ultrasound and fluoroscopic 10 French transhepatic cholecystostomy. Electronically Signed   By: Jerilynn Mages.  Shick M.D.   On: 09/08/2016 12:14   Dg Chest Port 1 View  Result Date: 09/09/2016 CLINICAL DATA:  Shortness of breath, wheezing. EXAM: PORTABLE CHEST 1 VIEW COMPARISON:  Radiograph August 07, 2017. FINDINGS: Stable cardiomediastinal silhouette. No pneumothorax is noted. Stable left midlung atelectasis is noted. Stable right basilar opacity is noted concerning for atelectasis or infiltrate. Bony thorax is unremarkable. IMPRESSION: Stable left midlung and right basilar opacities are noted concerning for atelectasis or possibly infiltrate. Electronically Signed   By: Marijo Conception, M.D.   On: 09/09/2016 09:50   Dg Chest Port 1 View  Result Date: 09/07/2016 CLINICAL DATA:  Hypoxia EXAM: PORTABLE CHEST 1 VIEW COMPARISON:  Chest radiograph and chest CT September 07, 2016 FINDINGS: There is persistent patchy airspace consolidation in the left mid lung and right base regions. There is no evident new opacity compared to earlier in the day. Heart is mildly enlarged with pulmonary vascularity within normal limits. There is atherosclerotic calcification in the aorta. There is stable eventration of the right hemidiaphragm. No adenopathy. No bone lesions. IMPRESSION: Areas of airspace consolidation in the left mid lung and right base regions. No new opacity. Stable cardiac silhouette. There is aortic atherosclerosis. Electronically Signed   By: Lowella Grip III M.D.   On: 09/07/2016 21:45   Dg Chest Port 1 View  Result Date: 09/07/2016 CLINICAL DATA:  Fever, hypoxia.  Subsequent encounter. EXAM: PORTABLE CHEST 1 VIEW COMPARISON:  09/05/2016 FINDINGS: There is opacity at the right lung base which is similar to the prior exam allowing for differences in patient positioning and technique. Mild linear scarring or atelectasis in the left mid lung, stable. Remainder of the lungs is clear. Elevation right hemidiaphragm is stable. No obvious  pleural effusion. No pneumothorax. IMPRESSION: 1. Right base opacity is similar to the prior exam. Although this may be atelectasis only, pneumonia should be considered likely given the symptoms of fever and hypoxia. 2. No significant change from the prior exam allowing for differences in positioning and technique. Electronically Signed   By: Lajean Manes M.D.   On: 09/07/2016 09:18   Dg Abd 2 Views  Result Date: 09/05/2016 CLINICAL DATA:  Abdominal distention EXAM: ABDOMEN - 2 VIEW COMPARISON:  CT abdomen and pelvis September 03, 2016 FINDINGS: Supine and upright images obtained. There is contrast in the colon. There is no bowel dilatation or air-fluid level suggesting bowel obstruction. No free air. There are phleboliths in the pelvis. IMPRESSION: Contrast in colon.  No bowel obstruction or free air evident. Electronically Signed   By:  Lowella Grip III M.D.   On: 09/05/2016 11:47    ECG  Atrial fibrillation 100 bpm   Echocardiogram 09/06/16  Study Conclusions  - Left ventricle: The cavity size was normal. Wall thickness was   increased in a pattern of moderate LVH. Systolic function was   vigorous. The estimated ejection fraction was in the range of 65%   to 70%. Wall motion was normal; there were no regional wall   motion abnormalities. Doppler parameters are consistent with   abnormal left ventricular relaxation (grade 1 diastolic   dysfunction). - Aortic valve: Valve area (VTI): 2.67 cm^2. Valve area (Vmax):   2.21 cm^2. Valve area (Vmean): 2.53 cm^2. - Atrial septum: No defect or patent foramen ovale was identified. - Pulmonary arteries: Systolic pressure was moderately increased.   PA peak pressure: 40 mm Hg (S). - Technically adequate study.    ASSESSMENT AND PLAN  Active Problems:   Community acquired pneumonia of right lower lobe of lung ([place])   Acute respiratory failure with hypoxia ([place])   Acute cholecystitis due to biliary calculus   AKI (acute kidney injury) ([place])   1. Atrial Fibrillation: pt notes long history of having an irregular heart beat, but no previously documented reports of atrial fibrillation/flutter. New diagnosis of atrial fibrillation noted this admission, in the setting of acute right sided CAP and acute cholecystitis. He is being treated conservatively with antibiotics and percutaneous chole drain (not a current surgical candidate for lap chole, at this time, given active PNA). Continue to treat underlying medical illnesses and monitor on telemetry. 2D echo shows normal LVEF and normal wall motion. No ischemic CP or dyspnea to suggest underlying CAD. Continue to monitor on telemetry and continue Cardizem for rate control. Hopefully his atrial arrthymia will resolve with treatment of acute pulmonary and GI illnesses. If he continues to have persistent atrial fibrillation, will need  to consider initiation of oral anticoagulation, once cleared from a surgical standpoint. His CHA2DS2 VASc score is at least 3 for HTN, DM and age 95-74. Given CKD, his options would be limited to Coumadin or perhaps low dose Eliquis. MD to follow with further recommendations. We will continue to follow-along with you.    Signed, Lyda Jester, PA-C 09/09/2016, 12:50 PM   History and all data above reviewed.  Patient examined.  I agree with the findings as above.  The patient is in atrial fib with rate currently controlled on IV dilt.  He is taking sips of water.  Feels better with percutaneous drain in place.  Denies presyncope or syncope.  No chest pain.  The patient exam reveals [id number]  ,  Lungs: Decreased breath sounds  ,  Abd: Distended with decreased breath sounds, Ext Mild edema  .  All available labs, radiology testing, previous records reviewed. Agree with documented assessment and plan. Atrial fib:  Continue with rate control IV and change to PO when able.  Also start warfarin when OK with surgery.  No need for acute arrhythmia intervention at this point but we will follow.    Jeneen Rinks Artis Beggs  6:46 PM  09/09/2016

## 2016-09-09 NOTE — Consult Note (Signed)
Reason for Consult: Acute renal failure on chronic kidney disease stage III Referring Physician: Reyne Dumas M.D. [facility])  HPI:  66 year old African-American man with past medical history significant for proteinuric chronic kidney disease stage III (baseline creatinine 1.3-1.5) with biopsy-proven collapsing variant of FSGS who follows up with Dr. Florene Glen at [facility]. He also has underlying history of hypertension, diabetes, rheumatoid arthritis and is a former smoker. He was admitted 4 days ago with increasing right-sided chest pain/abdominal pain that was deemed to be from a right lower lobe pneumonia. Thereafter, he had blood cultures positive for Escherichia coli bacteremia and further workup showed evidence of acute cholecystitis. Yesterday, he underwent her cutaneous cholecystostomy because of medical instability to undergo cholecystectomy. He has had a progressive worsening of renal insufficiency since admission with creatinine rising from 2.6 to 6.7 with decreasing urine output. He has developed new onset atrial fibrillation.  He has not received any intravenous iodinated contrast. No NSAIDs, no evidence of obstruction on CT scan of the abdomen and so far has had ceftriaxone, azithromycin and Zosyn. Unfortunately, had a dose of Fleet enema yesterday.  Creatinine has trended up as follows:  09/02/2016   09/05/2016   09/06/2016   09/07/2016   09/08/2016   09/09/2016    BUN 16 26 (H) 29 (H) 44 (H) 51 (H) 65 (H)  Creatinine 1.25 (H) 2.57 (H) 2.67 (H) 4.48 (H) 5.95 (H) 6.67 (H)    Past Medical History:  Diagnosis Date  . Chronic kidney disease   . Diabetes mellitus without complication ([place])   . Hypertension   . Rheumatoid arthritis ([place])   . Vitamin D deficiency    Hx: of    Past Surgical History:  Procedure Laterality Date  . AV FISTULA PLACEMENT Left 06/28/2013   Procedure: ARTERIOVENOUS (AV) FISTULA CREATION- Left arm with ultrasound guidance;  Surgeon: Rosetta Posner, MD;  Location: [facility] OR;   Service: Vascular;  Laterality: Left;  . COLONOSCOPY     hx: of  . HERNIA REPAIR    . INSERTION OF DIALYSIS CATHETER N/A 06/28/2013   Procedure: INSERTION OF DIALYSIS CATHETER;  Surgeon: Rosetta Posner, MD;  Location: [place];  Service: Vascular;  Laterality: N/A;  . IR GENERIC HISTORICAL  09/08/2016   IR PERC CHOLECYSTOSTOMY 09/08/2016 [name], MD [facility]-INTERV RAD  . LIGATION OF COMPETING BRANCHES OF ARTERIOVENOUS FISTULA Left 10/12/2013   Procedure: LIGATION OF COMPETING BRANCHES OF ARTERIOVENOUS FISTULA;  Surgeon: Elam Dutch, MD;  Location: [facility] OR;  Service: Vascular;  Laterality: Left;  . neck surgery      Family History  Problem Relation Age of Onset  . Cancer Mother   . Arthritis/Rheumatoid Sister     Social History:  reports that he quit smoking about 40 years ago. His smoking use included Cigarettes. He has a 5.00 pack-year smoking history. He has never used smokeless tobacco. He reports that he does not drink alcohol or use drugs.  Allergies:  Allergies  Allergen Reactions  . Glyburide Other (See Comments)    unknown  . Methotrexate Derivatives Other (See Comments)    Patient cannot remember    Medications:  Scheduled: . aspirin EC  81 mg Oral Daily  . cholecalciferol  1,000 Units Oral Daily  . insulin aspart  0-15 Units Subcutaneous TID WC  . insulin aspart  0-5 Units Subcutaneous QHS  . insulin aspart  4 Units Subcutaneous TID WC  . insulin glargine  40 Units Subcutaneous q morning - 10a  . levalbuterol  0.63 mg Nebulization Q6H  . methylPREDNISolone (SOLU-MEDROL) injection  40 mg Intravenous Q12H  .  morphine injection  1 mg Intravenous Once  . pantoprazole (PROTONIX) IV  40 mg Intravenous Q24H  . piperacillin-tazobactam (ZOSYN)  IV  2.25 g Intravenous Q6H  . senna-docusate  1 tablet Oral QHS    BMP Latest Ref Rng & Units 09/09/2016 09/08/2016 09/07/2016  Glucose 65 - 99 mg/dL 376(H) 102(H) 265(H)  BUN 6 - 20 mg/dL 65(H) 51(H) 44(H)  Creatinine 0.61 - 1.24 mg/dL  6.67(H) 5.95(H) 4.48(H)  Sodium 135 - 145 mmol/L 134(L) 140 138  Potassium 3.5 - 5.1 mmol/L 5.0 4.1 4.1  Chloride 101 - 111 mmol/L 106 110 105  CO2 22 - 32 mmol/L 17(L) 20(L) 24  Calcium 8.9 - 10.3 mg/dL 8.0(L) 8.3(L) 8.2(L)   CBC Latest Ref Rng & Units 09/09/2016 09/08/2016 09/07/2016  WBC 4.0 - 10.5 K/uL 11.4(H) 11.3(H) 11.4(H)  Hemoglobin 13.0 - 17.0 g/dL 10.6(L) 10.4(L) 11.4(L)  Hematocrit 39.0 - 52.0 % 32.2(L) 31.8(L) 35.3(L)  Platelets 150 - 400 K/uL 180 162 130(L)    Ct Abdomen Pelvis Wo Contrast  Result Date: 09/07/2016 CLINICAL DATA:  66 year old male with history of sepsis due to E coli bacteremia. Pneumonia. Acute on chronic kidney disease. EXAM: CT ABDOMEN AND PELVIS WITHOUT CONTRAST TECHNIQUE: Multidetector CT imaging of the abdomen and pelvis was performed following the standard protocol without IV contrast. COMPARISON:  CT the abdomen and pelvis 09/03/2016. FINDINGS: Lower chest: Multiple new areas of consolidation and volume loss throughout the lung bases bilaterally, most severe in the lower lobes (right greater than left), concerning for sequela of recent aspiration. Trace bilateral pleural effusions. Hepatobiliary: New compared to the prior examination is a subhepatic fluid collection and beneath the right lobe of the liver which is likely to be subcapsular. This is best appreciated on sagittal image 31 of series 6 and coronal image 56 of series 5 where it measures 10.6 x 3.8 x 8.6 cm. No other definite intrahepatic lesions are identified on today's noncontrast CT examination. Small focus of intermediate to high attenuation lying dependently in the gallbladder likely represents a noncalcified gallstone. Small amount of pericholecystic fluid. Haziness and inflammatory changes in the soft tissues surrounding the gallbladder. Gallbladder appears moderately distended. Pancreas: No definite pancreatic mass or peripancreatic inflammatory changes are noted on today's noncontrast CT examination.  Spleen: Unremarkable. Adrenals/Urinary Tract: 4.5 cm low-attenuation lesion in the lateral aspect of the interpolar region of the left kidney is incompletely characterized, but previously characterized as a simple cyst on prior study 05/21/2011. Right kidney and bilateral adrenal glands are normal in appearance. No hydroureteronephrosis. Urinary bladder is nearly completely decompressed around an indwelling Foley balloon catheter. Small amount of gas non dependently in the lumen of the urinary bladder is iatrogenic. Stomach/Bowel: Unenhanced appearance of the stomach is normal. There is no pathologic dilatation of small bowel or colon. Small duodenal diverticulum extending off the medial aspect of the second portion of the duodenum incidentally noted. No surrounding inflammatory changes to suggest an associated diverticulitis at this time. There also a few scattered colonic diverticulae, also without surrounding inflammatory changes to suggest an acute diverticulitis at this time. Normal appendix. Vascular/Lymphatic: Atherosclerotic calcifications in the abdominal aorta (mild), without evidence of aneurysm. No lymphadenopathy noted in the abdomen or pelvis on today's noncontrast CT examination. Reproductive: Prostate gland seminal vesicles are unremarkable in appearance. Other: Moderate sized left inguinal hernia containing only fat. No significant volume of ascites. No pneumoperitoneum. Musculoskeletal: There are  no aggressive appearing lytic or blastic lesions noted in the visualized portions of the skeleton. IMPRESSION: 1. Interval development of extensive pericholecystic fluid. There is a small noncalcified gallstone or sludge ball near the neck of the gallbladder. Notably, there are also inflammatory changes around the gallbladder and the pericholecystic fluid appears contiguous with a subhepatic fluid collection beneath the right lobe of the liver, which appears to be likely subcapsular in location. Clinical  correlation for signs and symptoms of acute cholecystitis is strongly recommended. 2. The appearance of the lower thorax suggests sequela of recent aspiration with multifocal aspiration pneumonitis/pneumonia. 3. Aortic atherosclerosis. 4. Additional incidental findings, as above. These results will be called to the ordering clinician or representative by the Radiologist Assistant, and communication documented in the PACS or zVision Dashboard. Electronically Signed   By: Vinnie Langton M.D.   On: 09/07/2016 16:52   Ir Perc Cholecystostomy  Result Date: 09/08/2016 INDICATION: ACUTE CHOLECYSTITIS, NON OPERATIVE CANDIDATE EXAM: CHOLECYSTOSTOMY MEDICATIONS: 3.375 G ZOSYN; The antibiotic was administered within an appropriate time frame prior to the initiation of the procedure. ANESTHESIA/SEDATION: Moderate (conscious) sedation was employed during this procedure. A total of Versed 1.0 mg and Fentanyl 25 mcg was administered intravenously. Moderate Sedation Time: 10 minutes. The patient's level of consciousness and vital signs were monitored continuously by radiology nursing throughout the procedure under my direct supervision. FLUOROSCOPY TIME:  Fluoroscopy Time:  48 seconds (10 mGy). COMPLICATIONS: None immediate. PROCEDURE: Informed written consent was obtained from the patient after a thorough discussion of the procedural risks, benefits and alternatives. All questions were addressed. Maximal Sterile Barrier Technique was utilized including caps, mask, sterile gowns, sterile gloves, sterile drape, hand hygiene and skin antiseptic. A timeout was performed prior to the initiation of the procedure. Previous imaging reviewed. Preliminary ultrasound performed. The abnormal gallbladder was localized in the right upper quadrant through a lower intercostal space. Overlying skin marked. Under sterile conditions and local anesthesia, percutaneous transhepatic needle access performed of the gallbladder under direct  ultrasound. Needle position confirmed with ultrasound. There was return of bile. Guidewire inserted followed by the Accustick dilator set. Amplatz guidewire exchange performed. Tract dilatation performed to insert a 10 [place] drain. Drain catheter position confirmed with ultrasound and fluoroscopy. There was return of exudative bile. Sample sent for Gram stain and culture. Catheter secured with a Prolene suture and connected to external gravity drainage. Sterile dressing applied. No immediate complication. IMPRESSION: Successful ultrasound and fluoroscopic 10 French transhepatic cholecystostomy. Electronically Signed   By: Jerilynn Mages.  Shick M.D.   On: 09/08/2016 12:14   Dg Chest Port 1 View  Result Date: 09/09/2016 CLINICAL DATA:  Shortness of breath, wheezing. EXAM: PORTABLE CHEST 1 VIEW COMPARISON:  Radiograph August 07, 2017. FINDINGS: Stable cardiomediastinal silhouette. No pneumothorax is noted. Stable left midlung atelectasis is noted. Stable right basilar opacity is noted concerning for atelectasis or infiltrate. Bony thorax is unremarkable. IMPRESSION: Stable left midlung and right basilar opacities are noted concerning for atelectasis or possibly infiltrate. Electronically Signed   By: Marijo Conception, M.D.   On: 09/09/2016 09:50   Dg Chest Port 1 View  Result Date: 09/07/2016 CLINICAL DATA:  Hypoxia EXAM: PORTABLE CHEST 1 VIEW COMPARISON:  Chest radiograph and chest CT September 07, 2016 FINDINGS: There is persistent patchy airspace consolidation in the left mid lung and right base regions. There is no evident new opacity compared to earlier in the day. Heart is mildly enlarged with pulmonary vascularity within normal limits. There is atherosclerotic calcification in the  aorta. There is stable eventration of the right hemidiaphragm. No adenopathy. No bone lesions. IMPRESSION: Areas of airspace consolidation in the left mid lung and right base regions. No new opacity. Stable cardiac silhouette. There is aortic  atherosclerosis. Electronically Signed   By: Lowella Grip III M.D.   On: 09/07/2016 21:45    Review of Systems  Constitutional: Positive for chills and malaise/fatigue. Negative for fever.  HENT: Positive for congestion and sore throat. Negative for ear pain and nosebleeds.   Respiratory: Positive for cough, sputum production and shortness of breath.   Cardiovascular: Positive for chest pain and palpitations. Negative for leg swelling and PND.       Right-sided chest pain  Gastrointestinal: Positive for abdominal pain and nausea. Negative for vomiting.       Right-sided abdominal pain  Genitourinary: Negative.   Musculoskeletal: Negative.   Skin: Negative.   Neurological: Positive for weakness.   Blood pressure 129/65, pulse (!) 108, temperature 97.9 F (36.6 C), temperature source Oral, resp. rate 20, height '6\' 1"'$  (1.854 m), weight 111.4 kg (245 lb 9.5 oz), SpO2 96 %. Physical Exam  Nursing note and vitals reviewed. Constitutional: He is oriented to person, place, and time. He appears well-developed and well-nourished. No distress.  HENT:  Head: Normocephalic and atraumatic.  Mouth/Throat: Oropharynx is clear and moist.  Eyes: EOM are normal. Pupils are equal, round, and reactive to light. No scleral icterus.  Neck: Normal range of motion. Neck supple. No JVD present. No tracheal deviation present.  Cardiovascular: Exam reveals no friction rub.   No murmur heard. Irregular tachycardia  Respiratory: Effort normal. He has rales.  Right base rales  GI: Bowel sounds are normal. He exhibits distension. There is tenderness.  Right upper quadrant percutaneous drain  Musculoskeletal: Normal range of motion. He exhibits edema.  Trace to 1+ bilateral lower extremity edema with dependent edema. Left radiocephalic fistula with good thrill and bruit  Neurological: He is alert and oriented to person, place, and time.  Skin: Skin is warm and dry.  Psychiatric: He has a normal mood and  affect.    Assessment/Plan: 1. Acute renal failure on chronic kidney disease stage III: Appears to be hemodynamically mediated/ATN associated with sepsis-currently appears to be nonoliguric but urine output appears to be progressively decreasing with rising creatinine. Given his volume status, will challenge him with intravenous furosemide to see if we can augment urine output. He does not have any clinical signs of uremia or volume excess to prompt a need for dialysis. He has hyperkalemia, hyponatremia and evidence of metabolic acidosis without any compelling indications to start dialysis. Will check urine electrolytes to further help direct management. Given the data, high risk for needing some form of renal replacement therapy. 2. Right lower lobe pneumonia/acute cholecystitis: On broad-spectrum antibiotic therapy and status post percutaneous cholecystostomy drain. Appears to be somewhat better clinically. 3. Hyperkalemia: Appears to be associated with concomitant hyperglycemia, direct efforts at glycemic control. 4. Hyponatremia/volume overload: Secondary to free water excretion defect associated with acute renal insufficiency. Continue efforts at attempts at diuresis. 5. Hypertension: Attempt efforts at optimization of diuretic therapy in addition with ongoing anti-hypertensive management. 6. Hyperphosphatemia: Currently on clear liquids, will reassess need to begin binders when able to take orally adequately.  Maylea Soria K. 09/09/2016, 1:35 PM

## 2016-09-09 NOTE — Progress Notes (Signed)
Subjective: Patient smiling, feels so much better after perc cholecystostomy Tolerating clears BM yesterday  Objective: Vital signs in last 24 hours: Temp:  [97.3 F (36.3 C)-98.4 F (36.9 C)] 98 F (36.7 C) (01/08 0758) Pulse Rate:  [90-120] 97 (01/08 0758) Resp:  [23-39] 27 (01/08 0758) BP: (134-164)/(63-82) 145/75 (01/08 0758) SpO2:  [91 %-98 %] 98 % (01/08 0758) Last BM Date: 09/08/16  Intake/Output from previous day: 01/07 0701 - 01/08 0700 In: 2117.5 [I.V.:2107.5] Out: 1085 [Urine:750; Drains:335] Intake/Output this shift: No intake/output data recorded.  General appearance: alert, cooperative and no distress GI: soft, mildly distended; minimal tenderness near drain; + BS Drain with cloudy bile  Lab Results:   Recent Labs  09/08/16 1017 09/09/16 0340  WBC 11.3* 11.4*  HGB 10.4* 10.6*  HCT 31.8* 32.2*  PLT 162 180   BMET  Recent Labs  09/08/16 1017 09/09/16 0340  NA 140 134*  K 4.1 5.0  CL 110 106  CO2 20* 17*  GLUCOSE 102* 376*  BUN 51* 65*  CREATININE 5.95* 6.67*  CALCIUM 8.3* 8.0*   Hepatic Function Latest Ref Rng & Units 09/09/2016 09/07/2016 09/05/2016  Total Protein 6.5 - 8.1 g/dL 6.4(L) 6.3(L) 7.4  Albumin 3.5 - 5.0 g/dL 1.7(L) 2.1(L) 3.0(L)  AST 15 - 41 U/L 57(H) 64(H) 23  ALT 17 - 63 U/L 47 45 21  Alk Phosphatase 38 - 126 U/L 128(H) 107 86  Total Bilirubin 0.3 - 1.2 mg/dL 1.2 2.2(H) 2.4(H)    PT/INR  Recent Labs  09/08/16 1017  LABPROT 14.4  INR 1.11   ABG No results for input(s): PHART, HCO3 in the last 72 hours.  Invalid input(s): PCO2, PO2  Studies/Results: Ct Abdomen Pelvis Wo Contrast  Result Date: 09/07/2016 CLINICAL DATA:  66 year old male with history of sepsis due to E coli bacteremia. Pneumonia. Acute on chronic kidney disease. EXAM: CT ABDOMEN AND PELVIS WITHOUT CONTRAST TECHNIQUE: Multidetector CT imaging of the abdomen and pelvis was performed following the standard protocol without IV contrast. COMPARISON:  CT the  abdomen and pelvis 09/03/2016. FINDINGS: Lower chest: Multiple new areas of consolidation and volume loss throughout the lung bases bilaterally, most severe in the lower lobes (right greater than left), concerning for sequela of recent aspiration. Trace bilateral pleural effusions. Hepatobiliary: New compared to the prior examination is a subhepatic fluid collection and beneath the right lobe of the liver which is likely to be subcapsular. This is best appreciated on sagittal image 31 of series 6 and coronal image 56 of series 5 where it measures 10.6 x 3.8 x 8.6 cm. No other definite intrahepatic lesions are identified on today's noncontrast CT examination. Small focus of intermediate to high attenuation lying dependently in the gallbladder likely represents a noncalcified gallstone. Small amount of pericholecystic fluid. Haziness and inflammatory changes in the soft tissues surrounding the gallbladder. Gallbladder appears moderately distended. Pancreas: No definite pancreatic mass or peripancreatic inflammatory changes are noted on today's noncontrast CT examination. Spleen: Unremarkable. Adrenals/Urinary Tract: 4.5 cm low-attenuation lesion in the lateral aspect of the interpolar region of the left kidney is incompletely characterized, but previously characterized as a simple cyst on prior study 05/21/2011. Right kidney and bilateral adrenal glands are normal in appearance. No hydroureteronephrosis. Urinary bladder is nearly completely decompressed around an indwelling Foley balloon catheter. Small amount of gas non dependently in the lumen of the urinary bladder is iatrogenic. Stomach/Bowel: Unenhanced appearance of the stomach is normal. There is no pathologic dilatation of small bowel or colon. Small  duodenal diverticulum extending off the medial aspect of the second portion of the duodenum incidentally noted. No surrounding inflammatory changes to suggest an associated diverticulitis at this time. There also  a few scattered colonic diverticulae, also without surrounding inflammatory changes to suggest an acute diverticulitis at this time. Normal appendix. Vascular/Lymphatic: Atherosclerotic calcifications in the abdominal aorta (mild), without evidence of aneurysm. No lymphadenopathy noted in the abdomen or pelvis on today's noncontrast CT examination. Reproductive: Prostate gland seminal vesicles are unremarkable in appearance. Other: Moderate sized left inguinal hernia containing only fat. No significant volume of ascites. No pneumoperitoneum. Musculoskeletal: There are no aggressive appearing lytic or blastic lesions noted in the visualized portions of the skeleton. IMPRESSION: 1. Interval development of extensive pericholecystic fluid. There is a small noncalcified gallstone or sludge ball near the neck of the gallbladder. Notably, there are also inflammatory changes around the gallbladder and the pericholecystic fluid appears contiguous with a subhepatic fluid collection beneath the right lobe of the liver, which appears to be likely subcapsular in location. Clinical correlation for signs and symptoms of acute cholecystitis is strongly recommended. 2. The appearance of the lower thorax suggests sequela of recent aspiration with multifocal aspiration pneumonitis/pneumonia. 3. Aortic atherosclerosis. 4. Additional incidental findings, as above. These results will be called to the ordering clinician or representative by the Radiologist Assistant, and communication documented in the PACS or zVision Dashboard. Electronically Signed   By: Vinnie Langton M.D.   On: 09/07/2016 16:52   Ct Chest Wo Contrast  Result Date: 09/07/2016 CLINICAL DATA:  66 year old male with hypoxia, fever and right-sided chest pain. Evaluate for pneumonia. EXAM: CT CHEST WITHOUT CONTRAST TECHNIQUE: Multidetector CT imaging of the chest was performed following the standard protocol without IV contrast. COMPARISON:  Chest x-ray obtained  earlier today ; prior CT scan of the abdomen and pelvis 09/03/2016 FINDINGS: Cardiovascular: Limited evaluation in the absence of intravenous contrast. Conventional 3 vessel arch anatomy. No evidence of aneurysm. The heart is normal in size. No pericardial effusion. The main pulmonary artery is borderline enlarged at 3.2 cm. Mediastinum/Nodes: Unremarkable CT appearance of the thyroid gland. No suspicious mediastinal or hilar adenopathy. No soft tissue mediastinal mass. The thoracic esophagus is unremarkable. Lungs/Pleura: Multifocal atelectasis involving the right middle and bilateral lower lobes. The degree of opacification of the right lower lobe is slightly greater than expected for atelectasis and a small amount of superimposed consolidation is difficult to exclude entirely. There is respiratory motion artifact which slightly limits evaluation. Trace left-sided pleural effusion. Upper Abdomen: Incompletely imaged gallbladder distension. Musculoskeletal: No acute fracture or aggressive appearing lytic or blastic osseous lesion. IMPRESSION: 1. Pulmonary findings are most consistent with multifocal atelectasis involving the right middle, right lower and left lower lobes. The degree of opacity in the right lower lobe is slightly greater than expected for the amount of volume loss and therefore a superimposed infiltrate/pneumonia is difficult to exclude radiographically. 2. Trace left pleural effusion. 3. Borderline enlarged main pulmonary artery. Query clinical history of pulmonary arterial hypertension? 4. Incompletely imaged gallbladder appears distended. Does the patient have right upper quadrant pain? If there is clinical concern for cholecystitis, consider right upper quadrant ultrasound. Electronically Signed   By: Jacqulynn Cadet M.D.   On: 09/07/2016 14:10   Ir Perc Cholecystostomy  Result Date: 09/08/2016 INDICATION: ACUTE CHOLECYSTITIS, NON OPERATIVE CANDIDATE EXAM: CHOLECYSTOSTOMY MEDICATIONS: 3.375  G ZOSYN; The antibiotic was administered within an appropriate time frame prior to the initiation of the procedure. ANESTHESIA/SEDATION: Moderate (conscious) sedation was employed  during this procedure. A total of Versed 1.0 mg and Fentanyl 25 mcg was administered intravenously. Moderate Sedation Time: 10 minutes. The patient's level of consciousness and vital signs were monitored continuously by radiology nursing throughout the procedure under my direct supervision. FLUOROSCOPY TIME:  Fluoroscopy Time:  48 seconds (10 mGy). COMPLICATIONS: None immediate. PROCEDURE: Informed written consent was obtained from the patient after a thorough discussion of the procedural risks, benefits and alternatives. All questions were addressed. Maximal Sterile Barrier Technique was utilized including caps, mask, sterile gowns, sterile gloves, sterile drape, hand hygiene and skin antiseptic. A timeout was performed prior to the initiation of the procedure. Previous imaging reviewed. Preliminary ultrasound performed. The abnormal gallbladder was localized in the right upper quadrant through a lower intercostal space. Overlying skin marked. Under sterile conditions and local anesthesia, percutaneous transhepatic needle access performed of the gallbladder under direct ultrasound. Needle position confirmed with ultrasound. There was return of bile. Guidewire inserted followed by the Accustick dilator set. Amplatz guidewire exchange performed. Tract dilatation performed to insert a 10 [place] drain. Drain catheter position confirmed with ultrasound and fluoroscopy. There was return of exudative bile. Sample sent for Gram stain and culture. Catheter secured with a Prolene suture and connected to external gravity drainage. Sterile dressing applied. No immediate complication. IMPRESSION: Successful ultrasound and fluoroscopic 10 French transhepatic cholecystostomy. Electronically Signed   By: Jerilynn Mages.  Shick M.D.   On: 09/08/2016 12:14   Dg Chest  Port 1 View  Result Date: 09/07/2016 CLINICAL DATA:  Hypoxia EXAM: PORTABLE CHEST 1 VIEW COMPARISON:  Chest radiograph and chest CT September 07, 2016 FINDINGS: There is persistent patchy airspace consolidation in the left mid lung and right base regions. There is no evident new opacity compared to earlier in the day. Heart is mildly enlarged with pulmonary vascularity within normal limits. There is atherosclerotic calcification in the aorta. There is stable eventration of the right hemidiaphragm. No adenopathy. No bone lesions. IMPRESSION: Areas of airspace consolidation in the left mid lung and right base regions. No new opacity. Stable cardiac silhouette. There is aortic atherosclerosis. Electronically Signed   By: Lowella Grip III M.D.   On: 09/07/2016 21:45    Anti-infectives: Anti-infectives    Start     Dose/Rate Route Frequency Ordered Stop   09/08/16 2000  piperacillin-tazobactam (ZOSYN) IVPB 2.25 g     2.25 g 100 mL/hr over 30 Minutes Intravenous Every 6 hours 09/08/16 1310     09/06/16 1000  azithromycin (ZITHROMAX) tablet 500 mg  Status:  Discontinued     500 mg Oral Every 24 hours 09/05/16 1422 09/08/16 0831   09/06/16 0800  cefTRIAXone (ROCEPHIN) 1 g in dextrose 5 % 50 mL IVPB  Status:  Discontinued     1 g 100 mL/hr over 30 Minutes Intravenous Every 24 hours 09/05/16 1422 09/05/16 1940   09/05/16 2000  piperacillin-tazobactam (ZOSYN) IVPB 3.375 g  Status:  Discontinued     3.375 g 12.5 mL/hr over 240 Minutes Intravenous Every 8 hours 09/05/16 1948 09/08/16 1310   09/05/16 0845  cefTRIAXone (ROCEPHIN) 1 g in dextrose 5 % 50 mL IVPB     1 g 100 mL/hr over 30 Minutes Intravenous  Once 09/05/16 0836 09/05/16 0912   09/05/16 0845  azithromycin (ZITHROMAX) 500 mg in dextrose 5 % 250 mL IVPB     500 mg 250 mL/hr over 60 Minutes Intravenous  Once 09/05/16 0836 09/05/16 1017      Assessment/Plan: Assessment/Plan: 1.  Right lower lobe pneumonia  with tachypnea, hypoxia, fever -  improving 2.  Acute cholecystitis - s/p percutaneous cholecystostomy 3.  Chronic kidney disease 4.  Rheumatoid arthritis 5.  Diabetes 6.  Hypertension  Advance diet as tolerated Follow-up as outpatient in 3-4 weeks with outpatient IR study via cholecystostomy tube Ambulate  LOS: 4 days    Gary Lang K. 09/09/2016

## 2016-09-09 NOTE — Progress Notes (Signed)
Physical Therapy Treatment Patient Details Name: Gary Lang MRN: [id number] DOB: 10/05/1950 Today's Date: 09/09/2016    History of Present Illness pt presents from outside hospital with PNA, acute on chronic kidney disease, and Cholecystitis now s/p drain placement.  pt with hx of CKD, DM, HTN, and RA.      PT Comments    Pt agreeable to OOB and mobility, and indicates feeling better since drain has been placed.  Pt does need encouragement to try as much as he is able and not rely on staff A.  Feel pt will be able to progress mobility and return to home with family support.  Will continue to follow.     Follow Up Recommendations  Home health PT;Supervision/Assistance - 24 hour     Equipment Recommendations  Rolling walker with 5" wheels;3in1 (PT)    Recommendations for Other Services       Precautions / Restrictions Precautions Precautions: Fall Restrictions Weight Bearing Restrictions: No    Mobility  Bed Mobility Overal bed mobility: Needs Assistance Bed Mobility: Supine to Sit     Supine to sit: Mod assist;HOB elevated     General bed mobility comments: pt needs cues for sequencing and needs A for bringing trunk up to sitting and scooting hips closer to EOB.    Transfers Overall transfer level: Needs assistance Equipment used: Rolling walker (2 wheeled) Transfers: Sit to/from Stand Sit to Stand: Min assist;+2 safety/equipment         General transfer comment: pt needs cues for UE use and trying as much as he is able as he tends to direct staff on how to lift him.    Ambulation/Gait Ambulation/Gait assistance: Min assist           General Gait Details: pt declines ambulation, but agreeable to side steps towards 3-in-1 which was placed at foot of the bed.  pt side stepped to R x4'.  A for management of RW and balance.   Stairs            Wheelchair Mobility    Modified Rankin (Stroke Patients Only)       Balance Overall balance  assessment: Needs assistance Sitting-balance support: No upper extremity supported;Feet supported Sitting balance-Leahy Scale: Fair     Standing balance support: Bilateral upper extremity supported;Single extremity supported;During functional activity Standing balance-Leahy Scale: Poor                      Cognition Arousal/Alertness: Awake/alert Behavior During Therapy: WFL for tasks assessed/performed Overall Cognitive Status: Within Functional Limits for tasks assessed                      Exercises      General Comments        Pertinent Vitals/Pain Pain Assessment: 0-10 Pain Score: 3  Pain Location: Drain insertion Pain Descriptors / Indicators: Tender Pain Intervention(s): Monitored during session;Premedicated before session;Repositioned    Home Living                      Prior Function            PT Goals (current goals can now be found in the care plan section) Acute Rehab PT Goals Patient Stated Goal: Pt wants to go home.  PT Goal Formulation: With patient Time For Goal Achievement: 09/13/16 Potential to Achieve Goals: Good Progress towards PT goals: Progressing toward goals    Frequency    Min  3X/week      PT Plan Current plan remains appropriate    Co-evaluation             End of Session Equipment Utilized During Treatment: Oxygen Activity Tolerance: Patient limited by fatigue (Self limits due to need for 3-in-1.) Patient left: with call bell/phone within reach (on 3-in1)     Time: 6767-2094 PT Time Calculation (min) (ACUTE ONLY): 19 min  Charges:  $Therapeutic Activity: 8-22 mins                    G CodesCatarina Hartshorn, [place]  [phone] 09/09/2016, 11:40 AM

## 2016-09-09 NOTE — Progress Notes (Addendum)
Inpatient Diabetes Program Recommendations  AACE/ADA: New Consensus Statement on Inpatient Glycemic Control (2015)  Target Ranges:  Prepandial:   less than 140 mg/dL      Peak postprandial:   less than 180 mg/dL (1-2 hours)      Critically ill patients:  140 - 180 mg/dL   Lab Results  Component Value Date   GLUCAP 379 (H) 09/09/2016   HGBA1C 11.4 01/31/2014   Results for Gary Lang, Gary Lang (MRN [id number]) as of 09/09/2016 11:15  Ref. Range 09/08/2016 12:31 09/08/2016 17:44 09/08/2016 21:05 09/09/2016 08:51  Glucose-Capillary Latest Ref Range: 65 - 99 mg/dL 72 195 (H) 286 (H) 379 (H)   Review of Glycemic Control  Diabetes history: DM, CKD, steroids this admission Outpatient Diabetes medications: Trulicity 1.5 mg Qweek (Wed), Novolog 18 TID, Lantus 40 QAM (+ 5 if >200 or -5 if < 150) Current orders for Inpatient glycemic control: Lantus 40 QHS, Novolog 0-15 units TIDAC and 0-5 units QHS, Novolog 4 units TIDAC meal coverage  Inpatient Diabetes Program Recommendations:    CBG's went from 72 (prior to steroids) to 379 in last 24 hours    Please consider increasing meal coverage to Novolog 7 units TIDAC if patient eats > 50% of meal while on steroids.  Text page sent to MD.  Thank you,  Windy Carina, RN, MSN Diabetes Coordinator Inpatient Diabetes Program [phone] (Team Pager)

## 2016-09-09 NOTE — Progress Notes (Signed)
Referring Physician(s):  Dr Derrek Gu  Supervising Physician: Marybelle Killings  Patient Status:  [facility] [place] - In-pt  Chief Complaint:  Percutaneous chole drain placed 1/7 in IR Draining well  Subjective:  Up in chair Feels so much better TB from 2.2 to 1.2 today Output great    Allergies: Glyburide and Methotrexate derivatives  Medications: Prior to Admission medications   Medication Sig Start Date End Date Taking? Authorizing Provider  acetaminophen (TYLENOL) 500 MG tablet Take 2 tablets (1,000 mg total) by mouth 3 (three) times daily as needed for pain. 02/07/12  Yes Zenia Resides, MD  aspirin 81 MG EC tablet Take 1 tablet (81 mg total) by mouth daily. Swallow whole. 02/07/12  Yes Zenia Resides, MD  atorvastatin (LIPITOR) 40 MG tablet Take 1 tablet by mouth daily. 08/13/16  Yes Historical Provider, MD  cholecalciferol (VITAMIN D) 1000 units tablet Take 1,000 Units by mouth daily.   Yes Historical Provider, MD  cloNIDine (CATAPRES) 0.2 MG tablet Take 0.2 mg by mouth daily.   Yes Historical Provider, MD  dicyclomine (BENTYL) 20 MG tablet Take 1 tablet (20 mg total) by mouth 2 (two) times daily as needed for spasms. 09/03/16  Yes Merryl Hacker, MD  diltiazem (DILACOR XR) 180 MG 24 hr capsule Take 1 capsule (180 mg total) by mouth daily. 08/05/13  Yes Waldemar Dickens, MD  furosemide (LASIX) 80 MG tablet Take 80 mg by mouth daily.    Yes Historical Provider, MD  insulin aspart (NOVOLOG FLEXPEN) 100 UNIT/ML FlexPen Inject 7.5 Units into the skin 3 (three) times daily with meals. Patient taking differently: Inject 18 Units into the skin 3 (three) times daily with meals.  01/31/14  Yes Waldemar Dickens, MD  Insulin Glargine (LANTUS) 100 UNIT/ML Solostar Pen Inject 20 Units into the skin every morning. Increase 5 u if am CBG is greater than 200.  Decrease 5 u if am CBG is less than 150 Patient taking differently: Inject 40 Units into the skin every morning. Increase 5 u if am CBG is greater  than 200.  Decrease 5 u if am CBG is less than 150 09/05/14  Yes Aquilla Hacker, MD  loperamide (IMODIUM) 2 MG capsule Take 1 capsule (2 mg total) by mouth 4 (four) times daily as needed for diarrhea or loose stools. 09/03/16  Yes Merryl Hacker, MD  Omega-3 Fatty Acids (FISH OIL PO) Take 1 capsule by mouth daily.   Yes Historical Provider, MD  ondansetron (ZOFRAN ODT) 4 MG disintegrating tablet Take 1 tablet (4 mg total) by mouth every 8 (eight) hours as needed for nausea or vomiting. 09/03/16  Yes Merryl Hacker, MD  sildenafil (VIAGRA) 50 MG tablet Take 0.5-1 tablets (25-50 mg total) by mouth daily as needed for erectile dysfunction. 02/01/14  Yes Waldemar Dickens, MD  TRULICITY 1.5 TL/5.7WI SOPN Inject 1 Units as directed once a week. Patient isn't sure how many units he takes. Patient takes on Wednesday's. 08/13/16  Yes Historical Provider, MD  Insulin Pen Needle (PEN NEEDLES) 31G X 6 MM MISC Use 1 needle per injection as directed 07/01/13   Patrecia Pour, MD  UNIFINE PENTIPS 31G X 5 MM MISC USE AS DIRECTED WITH INSULIN    Waldemar Dickens, MD     Vital Signs: BP (!) 145/75 (BP Location: Right Arm)   Pulse 97   Temp 98 F (36.7 C) (Oral)   Resp (!) 27   Ht '6\' 1"'$  (1.854  m)   Wt 245 lb 9.5 oz (111.4 kg)   SpO2 98%   BMI 32.40 kg/m   Physical Exam  Constitutional: He is oriented to person, place, and time.  Abdominal: Soft. Bowel sounds are normal.  Musculoskeletal: Normal range of motion.  Neurological: He is alert and oriented to person, place, and time.  Skin: Skin is warm and dry.  Site of drain is clean and dry NT OP 335 cc yesterday; 20 cc in bag +bile   Nursing note and vitals reviewed.   Imaging: Ct Abdomen Pelvis Wo Contrast  Result Date: 09/07/2016 CLINICAL DATA:  66 year old male with history of sepsis due to E coli bacteremia. Pneumonia. Acute on chronic kidney disease. EXAM: CT ABDOMEN AND PELVIS WITHOUT CONTRAST TECHNIQUE: Multidetector CT imaging of the abdomen  and pelvis was performed following the standard protocol without IV contrast. COMPARISON:  CT the abdomen and pelvis 09/03/2016. FINDINGS: Lower chest: Multiple new areas of consolidation and volume loss throughout the lung bases bilaterally, most severe in the lower lobes (right greater than left), concerning for sequela of recent aspiration. Trace bilateral pleural effusions. Hepatobiliary: New compared to the prior examination is a subhepatic fluid collection and beneath the right lobe of the liver which is likely to be subcapsular. This is best appreciated on sagittal image 31 of series 6 and coronal image 56 of series 5 where it measures 10.6 x 3.8 x 8.6 cm. No other definite intrahepatic lesions are identified on today's noncontrast CT examination. Small focus of intermediate to high attenuation lying dependently in the gallbladder likely represents a noncalcified gallstone. Small amount of pericholecystic fluid. Haziness and inflammatory changes in the soft tissues surrounding the gallbladder. Gallbladder appears moderately distended. Pancreas: No definite pancreatic mass or peripancreatic inflammatory changes are noted on today's noncontrast CT examination. Spleen: Unremarkable. Adrenals/Urinary Tract: 4.5 cm low-attenuation lesion in the lateral aspect of the interpolar region of the left kidney is incompletely characterized, but previously characterized as a simple cyst on prior study 05/21/2011. Right kidney and bilateral adrenal glands are normal in appearance. No hydroureteronephrosis. Urinary bladder is nearly completely decompressed around an indwelling Foley balloon catheter. Small amount of gas non dependently in the lumen of the urinary bladder is iatrogenic. Stomach/Bowel: Unenhanced appearance of the stomach is normal. There is no pathologic dilatation of small bowel or colon. Small duodenal diverticulum extending off the medial aspect of the second portion of the duodenum incidentally noted. No  surrounding inflammatory changes to suggest an associated diverticulitis at this time. There also a few scattered colonic diverticulae, also without surrounding inflammatory changes to suggest an acute diverticulitis at this time. Normal appendix. Vascular/Lymphatic: Atherosclerotic calcifications in the abdominal aorta (mild), without evidence of aneurysm. No lymphadenopathy noted in the abdomen or pelvis on today's noncontrast CT examination. Reproductive: Prostate gland seminal vesicles are unremarkable in appearance. Other: Moderate sized left inguinal hernia containing only fat. No significant volume of ascites. No pneumoperitoneum. Musculoskeletal: There are no aggressive appearing lytic or blastic lesions noted in the visualized portions of the skeleton. IMPRESSION: 1. Interval development of extensive pericholecystic fluid. There is a small noncalcified gallstone or sludge ball near the neck of the gallbladder. Notably, there are also inflammatory changes around the gallbladder and the pericholecystic fluid appears contiguous with a subhepatic fluid collection beneath the right lobe of the liver, which appears to be likely subcapsular in location. Clinical correlation for signs and symptoms of acute cholecystitis is strongly recommended. 2. The appearance of the lower thorax suggests sequela  of recent aspiration with multifocal aspiration pneumonitis/pneumonia. 3. Aortic atherosclerosis. 4. Additional incidental findings, as above. These results will be called to the ordering clinician or representative by the Radiologist Assistant, and communication documented in the PACS or zVision Dashboard. Electronically Signed   By: Vinnie Langton M.D.   On: 09/07/2016 16:52   Ct Chest Wo Contrast  Result Date: 09/07/2016 CLINICAL DATA:  66 year old male with hypoxia, fever and right-sided chest pain. Evaluate for pneumonia. EXAM: CT CHEST WITHOUT CONTRAST TECHNIQUE: Multidetector CT imaging of the chest was  performed following the standard protocol without IV contrast. COMPARISON:  Chest x-ray obtained earlier today ; prior CT scan of the abdomen and pelvis 09/03/2016 FINDINGS: Cardiovascular: Limited evaluation in the absence of intravenous contrast. Conventional 3 vessel arch anatomy. No evidence of aneurysm. The heart is normal in size. No pericardial effusion. The main pulmonary artery is borderline enlarged at 3.2 cm. Mediastinum/Nodes: Unremarkable CT appearance of the thyroid gland. No suspicious mediastinal or hilar adenopathy. No soft tissue mediastinal mass. The thoracic esophagus is unremarkable. Lungs/Pleura: Multifocal atelectasis involving the right middle and bilateral lower lobes. The degree of opacification of the right lower lobe is slightly greater than expected for atelectasis and a small amount of superimposed consolidation is difficult to exclude entirely. There is respiratory motion artifact which slightly limits evaluation. Trace left-sided pleural effusion. Upper Abdomen: Incompletely imaged gallbladder distension. Musculoskeletal: No acute fracture or aggressive appearing lytic or blastic osseous lesion. IMPRESSION: 1. Pulmonary findings are most consistent with multifocal atelectasis involving the right middle, right lower and left lower lobes. The degree of opacity in the right lower lobe is slightly greater than expected for the amount of volume loss and therefore a superimposed infiltrate/pneumonia is difficult to exclude radiographically. 2. Trace left pleural effusion. 3. Borderline enlarged main pulmonary artery. Query clinical history of pulmonary arterial hypertension? 4. Incompletely imaged gallbladder appears distended. Does the patient have right upper quadrant pain? If there is clinical concern for cholecystitis, consider right upper quadrant ultrasound. Electronically Signed   By: Jacqulynn Cadet M.D.   On: 09/07/2016 14:10   Ir Perc Cholecystostomy  Result Date:  09/08/2016 INDICATION: ACUTE CHOLECYSTITIS, NON OPERATIVE CANDIDATE EXAM: CHOLECYSTOSTOMY MEDICATIONS: 3.375 G ZOSYN; The antibiotic was administered within an appropriate time frame prior to the initiation of the procedure. ANESTHESIA/SEDATION: Moderate (conscious) sedation was employed during this procedure. A total of Versed 1.0 mg and Fentanyl 25 mcg was administered intravenously. Moderate Sedation Time: 10 minutes. The patient's level of consciousness and vital signs were monitored continuously by radiology nursing throughout the procedure under my direct supervision. FLUOROSCOPY TIME:  Fluoroscopy Time:  48 seconds (10 mGy). COMPLICATIONS: None immediate. PROCEDURE: Informed written consent was obtained from the patient after a thorough discussion of the procedural risks, benefits and alternatives. All questions were addressed. Maximal Sterile Barrier Technique was utilized including caps, mask, sterile gowns, sterile gloves, sterile drape, hand hygiene and skin antiseptic. A timeout was performed prior to the initiation of the procedure. Previous imaging reviewed. Preliminary ultrasound performed. The abnormal gallbladder was localized in the right upper quadrant through a lower intercostal space. Overlying skin marked. Under sterile conditions and local anesthesia, percutaneous transhepatic needle access performed of the gallbladder under direct ultrasound. Needle position confirmed with ultrasound. There was return of bile. Guidewire inserted followed by the Accustick dilator set. Amplatz guidewire exchange performed. Tract dilatation performed to insert a 10 [place] drain. Drain catheter position confirmed with ultrasound and fluoroscopy. There was return of exudative bile. Sample sent  for Gram stain and culture. Catheter secured with a Prolene suture and connected to external gravity drainage. Sterile dressing applied. No immediate complication. IMPRESSION: Successful ultrasound and fluoroscopic 10 French  transhepatic cholecystostomy. Electronically Signed   By: Jerilynn Mages.  Shick M.D.   On: 09/08/2016 12:14   Dg Chest Port 1 View  Result Date: 09/09/2016 CLINICAL DATA:  Shortness of breath, wheezing. EXAM: PORTABLE CHEST 1 VIEW COMPARISON:  Radiograph August 07, 2017. FINDINGS: Stable cardiomediastinal silhouette. No pneumothorax is noted. Stable left midlung atelectasis is noted. Stable right basilar opacity is noted concerning for atelectasis or infiltrate. Bony thorax is unremarkable. IMPRESSION: Stable left midlung and right basilar opacities are noted concerning for atelectasis or possibly infiltrate. Electronically Signed   By: Marijo Conception, M.D.   On: 09/09/2016 09:50   Dg Chest Port 1 View  Result Date: 09/07/2016 CLINICAL DATA:  Hypoxia EXAM: PORTABLE CHEST 1 VIEW COMPARISON:  Chest radiograph and chest CT September 07, 2016 FINDINGS: There is persistent patchy airspace consolidation in the left mid lung and right base regions. There is no evident new opacity compared to earlier in the day. Heart is mildly enlarged with pulmonary vascularity within normal limits. There is atherosclerotic calcification in the aorta. There is stable eventration of the right hemidiaphragm. No adenopathy. No bone lesions. IMPRESSION: Areas of airspace consolidation in the left mid lung and right base regions. No new opacity. Stable cardiac silhouette. There is aortic atherosclerosis. Electronically Signed   By: Lowella Grip III M.D.   On: 09/07/2016 21:45   Dg Chest Port 1 View  Result Date: 09/07/2016 CLINICAL DATA:  Fever, hypoxia.  Subsequent encounter. EXAM: PORTABLE CHEST 1 VIEW COMPARISON:  09/05/2016 FINDINGS: There is opacity at the right lung base which is similar to the prior exam allowing for differences in patient positioning and technique. Mild linear scarring or atelectasis in the left mid lung, stable. Remainder of the lungs is clear. Elevation right hemidiaphragm is stable. No obvious pleural effusion. No  pneumothorax. IMPRESSION: 1. Right base opacity is similar to the prior exam. Although this may be atelectasis only, pneumonia should be considered likely given the symptoms of fever and hypoxia. 2. No significant change from the prior exam allowing for differences in positioning and technique. Electronically Signed   By: Lajean Manes M.D.   On: 09/07/2016 09:18   Dg Abd 2 Views  Result Date: 09/05/2016 CLINICAL DATA:  Abdominal distention EXAM: ABDOMEN - 2 VIEW COMPARISON:  CT abdomen and pelvis September 03, 2016 FINDINGS: Supine and upright images obtained. There is contrast in the colon. There is no bowel dilatation or air-fluid level suggesting bowel obstruction. No free air. There are phleboliths in the pelvis. IMPRESSION: Contrast in colon.  No bowel obstruction or free air evident. Electronically Signed   By: Lowella Grip III M.D.   On: 09/05/2016 11:47    Labs:  CBC:  Recent Labs  09/06/16 0621 09/07/16 0719 09/08/16 1017 09/09/16 0340  WBC 10.8* 11.4* 11.3* 11.4*  HGB 11.8* 11.4* 10.4* 10.6*  HCT 36.6* 35.3* 31.8* 32.2*  PLT 127* 130* 162 180    COAGS:  Recent Labs  09/08/16 1017  INR 1.11    BMP:  Recent Labs  09/07/16 0719 09/07/16 1638 09/08/16 1017 09/09/16 0340  NA 136 138 140 134*  K 3.9 4.1 4.1 5.0  CL 103 105 110 106  CO2 24 24 20* 17*  GLUCOSE 300* 265* 102* 376*  BUN 39* 44* 51* 65*  CALCIUM 8.2* 8.2*  8.3* 8.0*  CREATININE 3.93* 4.48* 5.95* 6.67*  GFRNONAA 15* 12* 9* 8*  GFRAA 17* 14* 10* 9*    LIVER FUNCTION TESTS:  Recent Labs  09/02/16 2200 09/05/16 0721 09/07/16 1638 09/09/16 0340  BILITOT 0.3 2.4* 2.2* 1.2  AST 16 23 64* 57*  ALT 17 21 45 47  ALKPHOS 81 86 107 128*  PROT 7.5 7.4 6.3* 6.4*  ALBUMIN 3.6 3.0* 2.1* 1.7*    Assessment and Plan:  Perc chole drain intact- placed 1/7 Will need OP follow up in IR clinic 5-6 weeks Order placed for follow up----he will hear from IR drain clinic with time and date  Electronically  Signed: Koray Soter A 09/09/2016, 11:23 AM   I spent a total of 15 Minutes at the the patient's bedside AND on the patient's hospital floor or unit, greater than 50% of which was counseling/coordinating care for perc chole drain

## 2016-09-09 NOTE — Progress Notes (Signed)
PROGRESS NOTE    Gary Lang  WCB:[id number] DOB: [birth year] DOA: 09/05/2016 PCP: [name], MD   Brief Narrative: 65 y.o. male with medical history significant of chronic kidney disease, diabetes, hypertension, rheumatoid arthritis, ex-smoker quit about 20 years ago presented with fever up to 102 at home associated with chills and right-sided chest pain.Patient with RUQ pain and nausea. CT imaging consistent with acute cholecystitis. Also with hypoxia requiring about 5-6 liters of oxygen. Transferred to [facility] for surgery evaluation  Assessment & Plan:   Active Problems:   Community acquired pneumonia of right lower lobe of lung ([place])   Acute respiratory failure with hypoxia ([place])   Acute cholecystitis due to biliary calculus   AKI (acute kidney injury) ([place])    #Sepsis due to E Coli bacteremia and possibly commonly acquired pneumonia/acute cholecystitis: Chest x-ray showed right lower lobe pneumonia. Patient with tachycardia, leukocytosis, fever and abnormal x-ray finding on admission -2 out of 2 blood cultures growing gram-negative rods consistent with Escherichia coli. Follow up final culture and sensitivity. Continue Zosyn. Discontinue azithromycin  -Recent CT scan of abdomen and pelvis with no acute finding.  source of Escherichia coli bacteremia likely gallbladder. -Respiratory viral studies negative. Surgery  Donnie Mesa, MD recommended percutaneous cholecystostomy to decompress the gallbladder.patient is now  S/p perc cholecystostomy 09/08/16. After he has fully recovered from his pneumonia,   we can consider elective laparoscopic cholecystectomy    #Acute hypoxic respiratory failure likely in the setting of pneumonia and sepsis: Patient with higher requirement of oxygen last night. Currently on 4 L. Reported worsening shortness of breath. Repeat chest x-ray with no acute change. Continue treatment for pneumonia. Continue to monitor.  CT chest shows multifocal  atelectasis, possible superimposed pneumonia. Encourage incentive spirometry   #Acute on chronic kidney disease, history of FSGN , has AVF in place  likely hemodynamically mediated in the setting of Lasix and decreased oral intake and sepsis: Continue to hold Lasix. -Serum creatinine level worsened today. Presented with a creatinine of 1.25. Creatinine 6.67.  Urine output 750 mL over 24 hours, Continue IV hydration.  Continue strict I's and O's   Avoid nephrotoxins. -Patient required hemodialysis in the past and he has left upper extremity AV fistula. Nephrology Dr. Posey Pronto consulted   #Elevated troponin likely in the setting of sepsis: Trend cardiac enzymes in the setting of new onset atrial fibrillation -Echocardiogram showed vigorous left ventricular systolic function with EF of 65-30%. There were no regional wall motion abnormalities. Also with  grade 1 diastolic dysfunction. -Patient had tachycardia with heart rate of 160s this morning. Improved with metoprolol 5 IV. Continue diltiazem and clonidine. Does not have chest pain.   #Hypokalemia: Now hyperkalemia. We will give 1 dose of Kayexalate  #Abdominal pain likely due to constipation: Abnormal x-ray showed no acute finding. Recent CT was unremarkable. Patient reported no bowel movement for last 4-5 days. Still no bowel movement. Continue MiraLAX and Dulcolax. Lipase elevated, repeat  New-onset A fib? Trend troponins, cardiology consult, Was on by mouth Cardizem prior to admission Currently on Cardizem drip and rate controlled Cardiology consulted  Uncontrolled diabetes  started Lantus Check hemoglobin A1c Continue sliding-scale insulin Decreased dose of steroids   DVT prophylaxis: Heparin subcutaneous Code Status: Full code Family Communication: No family present at bedside Disposition Plan: Continue stepdown    Consultants:   Nephrology  Cardiology    Procedures: None Antimicrobials: Azithromycin and  Zosyn.  Subjective:   Continues to be in atrial fibrillation, denies any  chest pain or shortness of breath  Objective: Vitals:   09/08/16 2311 09/09/16 0146 09/09/16 0309 09/09/16 0758  BP: 137/72  134/78 (!) 145/75  Pulse: 95  96 97  Resp: (!) 27  (!) 23 (!) 27  Temp: 98.4 F (36.9 C)  97.3 F (36.3 C) 98 F (36.7 C)  TempSrc: Oral  Oral Oral  SpO2: 95% 96% 96% 98%  Weight:      Height:        Intake/Output Summary (Last 24 hours) at 09/09/16 0936 Last data filed at 09/09/16 0600  Gross per 24 hour  Intake           2117.5 ml  Output             1085 ml  Net           1032.5 ml   Filed Weights   09/05/16 0729 09/05/16 1442 09/07/16 1459  Weight: 102.1 kg (225 lb) 102.1 kg (225 lb) 111.4 kg (245 lb 9.5 oz)    Examination:  General exam: Ill-looking male, lying in bed, not in distress Respiratory system: Clear bilaterally except mild intermittent wheeze, speaking full sentences. Cardiovascular system: S1 and S2 normal. Regular rate rhythm. No pedal edema. Gastrointestinal system: Abdomen soft, nontender, nondistended. Bowel sound positive.  Central nervous system: Alert and oriented. No focal neurological deficits. Extremities: Symmetric 5 x 5 power. Skin: No rashes, lesions or ulcers Psychiatry: Judgement and insight appear normal. Mood & affect appropriate.     Data Reviewed: I have personally reviewed following labs and imaging studies  CBC:  Recent Labs Lab 09/02/16 2200 09/05/16 0721 09/06/16 0621 09/07/16 0719 09/08/16 1017 09/09/16 0340  WBC 12.9* 12.5* 10.8* 11.4* 11.3* 11.4*  NEUTROABS 11.2* 11.4*  --   --   --   --   HGB 13.0 13.5 11.8* 11.4* 10.4* 10.6*  HCT 39.7 40.8 36.6* 35.3* 31.8* 32.2*  MCV 83.1 84.1 84.7 83.8 81.1 81.7  PLT 220 137* 127* 130* 162 607   Basic Metabolic Panel:  Recent Labs Lab 09/06/16 0621 09/07/16 0719 09/07/16 1208 09/07/16 1638 09/08/16 1017 09/09/16 0340  NA 139 136  --  138 140 134*  K 4.5 3.9  --  4.1  4.1 5.0  CL 109 103  --  105 110 106  CO2 21* 24  --  24 20* 17*  GLUCOSE 245* 300*  --  265* 102* 376*  BUN 29* 39*  --  44* 51* 65*  CREATININE 2.67* 3.93*  --  4.48* 5.95* 6.67*  CALCIUM 7.8* 8.2*  --  8.2* 8.3* 8.0*  MG 1.7  --  2.1  --   --   --    GFR: Estimated Creatinine Clearance: 14.3 mL/min (by C-G formula based on SCr of 6.67 mg/dL (H)). Liver Function Tests:  Recent Labs Lab 09/02/16 2200 09/05/16 0721 09/07/16 1638 09/09/16 0340  AST 16 23 64* 57*  ALT 17 21 45 47  ALKPHOS 81 86 107 128*  BILITOT 0.3 2.4* 2.2* 1.2  PROT 7.5 7.4 6.3* 6.4*  ALBUMIN 3.6 3.0* 2.1* 1.7*    Recent Labs Lab 09/02/16 2200 09/05/16 0721 09/07/16 1638 09/08/16 1017  LIPASE 35 22 77* 71*   No results for input(s): AMMONIA in the last 168 hours. Coagulation Profile:  Recent Labs Lab 09/08/16 1017  INR 1.11   Cardiac Enzymes:  Recent Labs Lab 09/05/16 0721 09/05/16 1517 09/05/16 2023  TROPONINI 0.06* 0.05* 0.05*   BNP (last 3 results) No results  for input(s): PROBNP in the last 8760 hours. HbA1C: No results for input(s): HGBA1C in the last 72 hours. CBG:  Recent Labs Lab 09/08/16 0813 09/08/16 1231 09/08/16 1744 09/08/16 2105 09/09/16 0851  GLUCAP 123* 72 195* 286* 379*   Lipid Profile: No results for input(s): CHOL, HDL, LDLCALC, TRIG, CHOLHDL, LDLDIRECT in the last 72 hours. Thyroid Function Tests: No results for input(s): TSH, T4TOTAL, FREET4, T3FREE, THYROIDAB in the last 72 hours. Anemia Panel: No results for input(s): VITAMINB12, FOLATE, FERRITIN, TIBC, IRON, RETICCTPCT in the last 72 hours. Sepsis Labs:  Recent Labs Lab 09/02/16 2320 09/05/16 0738 09/05/16 1010  LATICACIDVEN 1.3 2.80* 0.99    Recent Results (from the past 240 hour(s))  Urine culture     Status: Abnormal   Collection Time: 09/03/16 12:36 AM  Result Value Ref Range Status   Specimen Description URINE, CLEAN CATCH  Final   Special Requests NONE  Final   Culture (A)  Final     <10,000 COLONIES/mL INSIGNIFICANT GROWTH Performed at [facility]    Report Status 09/04/2016 FINAL  Final  Blood Culture (routine x 2)     Status: Abnormal   Collection Time: 09/05/16  7:21 AM  Result Value Ref Range Status   Specimen Description BLOOD RIGHT FOREARM  Final   Special Requests BOTTLES DRAWN AEROBIC AND ANAEROBIC 6CC  Final   Culture  Setup Time   Final    Performed at [facility] AEROBIC AND ANAEROBIC BOTTLES Gram Stain Report Called to,Read Back By and Verified With: STONE,B AT 1930 ON 09/05/2016 BY ISLEY,B CRITICAL RESULT CALLED TO, READ BACK BY AND VERIFIED WITH: N LIGHTNER,RN '@[id number]'$  09/06/16 MKELLY,MLT Performed at [facility] (A)  Final   Report Status 09/08/2016 FINAL  Final   Organism ID, Bacteria ESCHERICHIA COLI  Final      Susceptibility   Escherichia coli - MIC*    AMPICILLIN >=32 RESISTANT Resistant     CEFAZOLIN <=4 SENSITIVE Sensitive     CEFEPIME <=1 SENSITIVE Sensitive     CEFTAZIDIME <=1 SENSITIVE Sensitive     CEFTRIAXONE <=1 SENSITIVE Sensitive     CIPROFLOXACIN <=0.25 SENSITIVE Sensitive     GENTAMICIN <=1 SENSITIVE Sensitive     IMIPENEM <=0.25 SENSITIVE Sensitive     TRIMETH/SULFA <=20 SENSITIVE Sensitive     AMPICILLIN/SULBACTAM 16 INTERMEDIATE Intermediate     PIP/TAZO <=4 SENSITIVE Sensitive     Extended ESBL NEGATIVE Sensitive     * ESCHERICHIA COLI  Blood Culture ID Panel (Reflexed)     Status: Abnormal   Collection Time: 09/05/16  7:21 AM  Result Value Ref Range Status   Enterococcus species NOT DETECTED NOT DETECTED Final   Listeria monocytogenes NOT DETECTED NOT DETECTED Final   Staphylococcus species NOT DETECTED NOT DETECTED Final   Staphylococcus aureus NOT DETECTED NOT DETECTED Final   Streptococcus species NOT DETECTED NOT DETECTED Final   Streptococcus agalactiae NOT DETECTED NOT DETECTED Final   Streptococcus pneumoniae NOT DETECTED NOT  DETECTED Final   Streptococcus pyogenes NOT DETECTED NOT DETECTED Final   Acinetobacter baumannii NOT DETECTED NOT DETECTED Final   Enterobacteriaceae species DETECTED (A) NOT DETECTED Final    Comment: CRITICAL RESULT CALLED TO, READ BACK BY AND VERIFIED WITH: N LIGHTNER,RN '@0312'$  09/06/16 MKELLY,MLT    Enterobacter cloacae complex NOT DETECTED NOT DETECTED Final   Escherichia coli DETECTED (A) NOT DETECTED Final    Comment: CRITICAL RESULT CALLED  TO, READ BACK BY AND VERIFIED WITH: N LIGHTNER,RN '@0312'$  09/06/16 MKELLY,MLT    Klebsiella oxytoca NOT DETECTED NOT DETECTED Final   Klebsiella pneumoniae NOT DETECTED NOT DETECTED Final   Proteus species NOT DETECTED NOT DETECTED Final   Serratia marcescens NOT DETECTED NOT DETECTED Final   Carbapenem resistance NOT DETECTED NOT DETECTED Final   Haemophilus influenzae NOT DETECTED NOT DETECTED Final   Neisseria meningitidis NOT DETECTED NOT DETECTED Final   Pseudomonas aeruginosa NOT DETECTED NOT DETECTED Final   Candida albicans NOT DETECTED NOT DETECTED Final   Candida glabrata NOT DETECTED NOT DETECTED Final   Candida krusei NOT DETECTED NOT DETECTED Final   Candida parapsilosis NOT DETECTED NOT DETECTED Final   Candida tropicalis NOT DETECTED NOT DETECTED Final    Comment: Performed at [facility]  Blood Culture (routine x 2)     Status: Abnormal   Collection Time: 09/05/16  7:25 AM  Result Value Ref Range Status   Specimen Description BLOOD RIGHT HAND  Final   Special Requests BOTTLES DRAWN AEROBIC AND ANAEROBIC 8CC  Final   Culture  Setup Time   Final    Performed at [place] IN BOTH AEROBIC AND ANAEROBIC BOTTLES Gram Stain Report Called to,Read Back By and Verified With: STONE,B AT 1930 ON 09/05/2016 BY ISLEY,B    Culture (A)  Final    ESCHERICHIA COLI SUSCEPTIBILITIES PERFORMED ON PREVIOUS CULTURE WITHIN THE LAST 5 DAYS. Performed at [facility]    Report Status 09/08/2016 FINAL   Final  Urine culture     Status: Abnormal   Collection Time: 09/05/16  1:10 PM  Result Value Ref Range Status   Specimen Description URINE, RANDOM  Final   Special Requests NONE  Final   Culture MULTIPLE SPECIES PRESENT, SUGGEST RECOLLECTION (A)  Final   Report Status 09/07/2016 FINAL  Final  Respiratory Panel by PCR     Status: None   Collection Time: 09/05/16  2:22 PM  Result Value Ref Range Status   Adenovirus NOT DETECTED NOT DETECTED Final   Coronavirus 229E NOT DETECTED NOT DETECTED Final   Coronavirus HKU1 NOT DETECTED NOT DETECTED Final   Coronavirus NL63 NOT DETECTED NOT DETECTED Final   Coronavirus OC43 NOT DETECTED NOT DETECTED Final   Metapneumovirus NOT DETECTED NOT DETECTED Final   Rhinovirus / Enterovirus NOT DETECTED NOT DETECTED Final   Influenza A NOT DETECTED NOT DETECTED Final   Influenza B NOT DETECTED NOT DETECTED Final   Parainfluenza Virus 1 NOT DETECTED NOT DETECTED Final   Parainfluenza Virus 2 NOT DETECTED NOT DETECTED Final   Parainfluenza Virus 3 NOT DETECTED NOT DETECTED Final   Parainfluenza Virus 4 NOT DETECTED NOT DETECTED Final   Respiratory Syncytial Virus NOT DETECTED NOT DETECTED Final   Bordetella pertussis NOT DETECTED NOT DETECTED Final   Chlamydophila pneumoniae NOT DETECTED NOT DETECTED Final   Mycoplasma pneumoniae NOT DETECTED NOT DETECTED Final    Comment: Performed at [facility]  Culture, blood (routine x 2)     Status: None (Preliminary result)   Collection Time: 09/07/16 11:58 AM  Result Value Ref Range Status   Specimen Description BLOOD RIGHT WRIST  Final   Special Requests BOTTLES DRAWN AEROBIC AND ANAEROBIC [place]  Final   Culture NO GROWTH 2 DAYS  Final   Report Status PENDING  Incomplete  Culture, blood (routine x 2)     Status: None (Preliminary result)   Collection Time: 09/07/16 12:08 PM  Result Value Ref Range Status   Specimen Description BLOOD RIGHT HAND  Final   Special Requests BOTTLES DRAWN AEROBIC ONLY  4CC ONLY  Final   Culture NO GROWTH 2 DAYS  Final   Report Status PENDING  Incomplete  Aerobic/Anaerobic Culture (surgical/deep wound)     Status: None (Preliminary result)   Collection Time: 09/08/16 12:11 PM  Result Value Ref Range Status   Specimen Description ABSCESS GALL BLADDER  Final   Special Requests Normal  Final   Gram Stain   Final    RARE WBC PRESENT,BOTH PMN AND MONONUCLEAR NO ORGANISMS SEEN    Culture PENDING  Incomplete   Report Status PENDING  Incomplete         Radiology Studies: Ct Abdomen Pelvis Wo Contrast  Result Date: 09/07/2016 CLINICAL DATA:  66 year old male with history of sepsis due to E coli bacteremia. Pneumonia. Acute on chronic kidney disease. EXAM: CT ABDOMEN AND PELVIS WITHOUT CONTRAST TECHNIQUE: Multidetector CT imaging of the abdomen and pelvis was performed following the standard protocol without IV contrast. COMPARISON:  CT the abdomen and pelvis 09/03/2016. FINDINGS: Lower chest: Multiple new areas of consolidation and volume loss throughout the lung bases bilaterally, most severe in the lower lobes (right greater than left), concerning for sequela of recent aspiration. Trace bilateral pleural effusions. Hepatobiliary: New compared to the prior examination is a subhepatic fluid collection and beneath the right lobe of the liver which is likely to be subcapsular. This is best appreciated on sagittal image 31 of series 6 and coronal image 56 of series 5 where it measures 10.6 x 3.8 x 8.6 cm. No other definite intrahepatic lesions are identified on today's noncontrast CT examination. Small focus of intermediate to high attenuation lying dependently in the gallbladder likely represents a noncalcified gallstone. Small amount of pericholecystic fluid. Haziness and inflammatory changes in the soft tissues surrounding the gallbladder. Gallbladder appears moderately distended. Pancreas: No definite pancreatic mass or peripancreatic inflammatory changes are noted on  today's noncontrast CT examination. Spleen: Unremarkable. Adrenals/Urinary Tract: 4.5 cm low-attenuation lesion in the lateral aspect of the interpolar region of the left kidney is incompletely characterized, but previously characterized as a simple cyst on prior study 05/21/2011. Right kidney and bilateral adrenal glands are normal in appearance. No hydroureteronephrosis. Urinary bladder is nearly completely decompressed around an indwelling Foley balloon catheter. Small amount of gas non dependently in the lumen of the urinary bladder is iatrogenic. Stomach/Bowel: Unenhanced appearance of the stomach is normal. There is no pathologic dilatation of small bowel or colon. Small duodenal diverticulum extending off the medial aspect of the second portion of the duodenum incidentally noted. No surrounding inflammatory changes to suggest an associated diverticulitis at this time. There also a few scattered colonic diverticulae, also without surrounding inflammatory changes to suggest an acute diverticulitis at this time. Normal appendix. Vascular/Lymphatic: Atherosclerotic calcifications in the abdominal aorta (mild), without evidence of aneurysm. No lymphadenopathy noted in the abdomen or pelvis on today's noncontrast CT examination. Reproductive: Prostate gland seminal vesicles are unremarkable in appearance. Other: Moderate sized left inguinal hernia containing only fat. No significant volume of ascites. No pneumoperitoneum. Musculoskeletal: There are no aggressive appearing lytic or blastic lesions noted in the visualized portions of the skeleton. IMPRESSION: 1. Interval development of extensive pericholecystic fluid. There is a small noncalcified gallstone or sludge ball near the neck of the gallbladder. Notably, there are also inflammatory changes around the gallbladder and the pericholecystic fluid appears contiguous with a subhepatic fluid collection beneath the  right lobe of the liver, which appears to be likely  subcapsular in location. Clinical correlation for signs and symptoms of acute cholecystitis is strongly recommended. 2. The appearance of the lower thorax suggests sequela of recent aspiration with multifocal aspiration pneumonitis/pneumonia. 3. Aortic atherosclerosis. 4. Additional incidental findings, as above. These results will be called to the ordering clinician or representative by the Radiologist Assistant, and communication documented in the PACS or zVision Dashboard. Electronically Signed   By: Vinnie Langton M.D.   On: 09/07/2016 16:52   Ct Chest Wo Contrast  Result Date: 09/07/2016 CLINICAL DATA:  66 year old male with hypoxia, fever and right-sided chest pain. Evaluate for pneumonia. EXAM: CT CHEST WITHOUT CONTRAST TECHNIQUE: Multidetector CT imaging of the chest was performed following the standard protocol without IV contrast. COMPARISON:  Chest x-ray obtained earlier today ; prior CT scan of the abdomen and pelvis 09/03/2016 FINDINGS: Cardiovascular: Limited evaluation in the absence of intravenous contrast. Conventional 3 vessel arch anatomy. No evidence of aneurysm. The heart is normal in size. No pericardial effusion. The main pulmonary artery is borderline enlarged at 3.2 cm. Mediastinum/Nodes: Unremarkable CT appearance of the thyroid gland. No suspicious mediastinal or hilar adenopathy. No soft tissue mediastinal mass. The thoracic esophagus is unremarkable. Lungs/Pleura: Multifocal atelectasis involving the right middle and bilateral lower lobes. The degree of opacification of the right lower lobe is slightly greater than expected for atelectasis and a small amount of superimposed consolidation is difficult to exclude entirely. There is respiratory motion artifact which slightly limits evaluation. Trace left-sided pleural effusion. Upper Abdomen: Incompletely imaged gallbladder distension. Musculoskeletal: No acute fracture or aggressive appearing lytic or blastic osseous lesion.  IMPRESSION: 1. Pulmonary findings are most consistent with multifocal atelectasis involving the right middle, right lower and left lower lobes. The degree of opacity in the right lower lobe is slightly greater than expected for the amount of volume loss and therefore a superimposed infiltrate/pneumonia is difficult to exclude radiographically. 2. Trace left pleural effusion. 3. Borderline enlarged main pulmonary artery. Query clinical history of pulmonary arterial hypertension? 4. Incompletely imaged gallbladder appears distended. Does the patient have right upper quadrant pain? If there is clinical concern for cholecystitis, consider right upper quadrant ultrasound. Electronically Signed   By: Jacqulynn Cadet M.D.   On: 09/07/2016 14:10   Ir Perc Cholecystostomy  Result Date: 09/08/2016 INDICATION: ACUTE CHOLECYSTITIS, NON OPERATIVE CANDIDATE EXAM: CHOLECYSTOSTOMY MEDICATIONS: 3.375 G ZOSYN; The antibiotic was administered within an appropriate time frame prior to the initiation of the procedure. ANESTHESIA/SEDATION: Moderate (conscious) sedation was employed during this procedure. A total of Versed 1.0 mg and Fentanyl 25 mcg was administered intravenously. Moderate Sedation Time: 10 minutes. The patient's level of consciousness and vital signs were monitored continuously by radiology nursing throughout the procedure under my direct supervision. FLUOROSCOPY TIME:  Fluoroscopy Time:  48 seconds (10 mGy). COMPLICATIONS: None immediate. PROCEDURE: Informed written consent was obtained from the patient after a thorough discussion of the procedural risks, benefits and alternatives. All questions were addressed. Maximal Sterile Barrier Technique was utilized including caps, mask, sterile gowns, sterile gloves, sterile drape, hand hygiene and skin antiseptic. A timeout was performed prior to the initiation of the procedure. Previous imaging reviewed. Preliminary ultrasound performed. The abnormal gallbladder was  localized in the right upper quadrant through a lower intercostal space. Overlying skin marked. Under sterile conditions and local anesthesia, percutaneous transhepatic needle access performed of the gallbladder under direct ultrasound. Needle position confirmed with ultrasound. There was return of bile. Guidewire inserted  followed by the Accustick dilator set. Amplatz guidewire exchange performed. Tract dilatation performed to insert a 10 [place] drain. Drain catheter position confirmed with ultrasound and fluoroscopy. There was return of exudative bile. Sample sent for Gram stain and culture. Catheter secured with a Prolene suture and connected to external gravity drainage. Sterile dressing applied. No immediate complication. IMPRESSION: Successful ultrasound and fluoroscopic 10 French transhepatic cholecystostomy. Electronically Signed   By: Jerilynn Mages.  Shick M.D.   On: 09/08/2016 12:14   Dg Chest Port 1 View  Result Date: 09/07/2016 CLINICAL DATA:  Hypoxia EXAM: PORTABLE CHEST 1 VIEW COMPARISON:  Chest radiograph and chest CT September 07, 2016 FINDINGS: There is persistent patchy airspace consolidation in the left mid lung and right base regions. There is no evident new opacity compared to earlier in the day. Heart is mildly enlarged with pulmonary vascularity within normal limits. There is atherosclerotic calcification in the aorta. There is stable eventration of the right hemidiaphragm. No adenopathy. No bone lesions. IMPRESSION: Areas of airspace consolidation in the left mid lung and right base regions. No new opacity. Stable cardiac silhouette. There is aortic atherosclerosis. Electronically Signed   By: Lowella Grip III M.D.   On: 09/07/2016 21:45        Scheduled Meds: . aspirin EC  81 mg Oral Daily  . cholecalciferol  1,000 Units Oral Daily  . insulin aspart  0-15 Units Subcutaneous TID WC  . insulin aspart  0-5 Units Subcutaneous QHS  . insulin aspart  4 Units Subcutaneous TID WC  . insulin  glargine  40 Units Subcutaneous q morning - 10a  . levalbuterol  0.63 mg Nebulization Q6H  . methylPREDNISolone (SOLU-MEDROL) injection  40 mg Intravenous Q12H  .  morphine injection  1 mg Intravenous Once  . pantoprazole (PROTONIX) IV  40 mg Intravenous Q24H  . piperacillin-tazobactam (ZOSYN)  IV  2.25 g Intravenous Q6H  . senna-docusate  1 tablet Oral QHS  . sodium polystyrene  15 g Oral Once   Continuous Infusions: . sodium chloride 100 mL/hr at 09/09/16 0600  . diltiazem (CARDIZEM) infusion 5 mg/hr (09/09/16 0600)     LOS: 4 days    Reyne Dumas, MD [facility] Pager [phone]  If 7PM-7AM, please contact night-coverage [email or web address] Password [facility] 09/09/2016, 9:36 AM

## 2016-09-09 NOTE — Care Management Important Message (Signed)
Important Message  Patient Details  Name: Gary Lang MRN: [id number] Date of Birth: Jul 13, 1951   Medicare Important Message Given:  Yes    Nathen May 09/09/2016, 12:57 PM

## 2016-09-09 NOTE — Progress Notes (Signed)
CRITICAL VALUE ALERT  Critical value received:  Trop 0.04  Date of notification:  09/09/16  Time of notification:  3785  Critical value read back:Yes.    Nurse who received alert:  Adair Laundry   MD notified (1st page):  Dr. Allyson Sabal  Time of first page:  10  MD notified (2nd page):  Time of second page:  Responding MD:  Dr. Allyson Sabal  Time MD responded:  [phone]

## 2016-09-10 ENCOUNTER — Inpatient Hospital Stay (HOSPITAL_COMMUNITY): Payer: PPO

## 2016-09-10 LAB — COMPREHENSIVE METABOLIC PANEL
ALBUMIN: 1.6 g/dL — AB (ref 3.5–5.0)
ALK PHOS: 111 U/L (ref 38–126)
ALT: 39 U/L (ref 17–63)
AST: 30 U/L (ref 15–41)
Anion gap: 13 (ref 5–15)
BUN: 81 mg/dL — ABNORMAL HIGH (ref 6–20)
CALCIUM: 8 mg/dL — AB (ref 8.9–10.3)
CO2: 18 mmol/L — AB (ref 22–32)
CREATININE: 7.25 mg/dL — AB (ref 0.61–1.24)
Chloride: 106 mmol/L (ref 101–111)
GFR calc Af Amer: 8 mL/min — ABNORMAL LOW (ref >60)
GFR calc non Af Amer: 7 mL/min — ABNORMAL LOW (ref >60)
GLUCOSE: 299 mg/dL — AB (ref 65–99)
Potassium: 4.5 mmol/L (ref 3.5–5.1)
SODIUM: 137 mmol/L (ref 135–145)
Total Bilirubin: 1.1 mg/dL (ref 0.3–1.2)
Total Protein: 6.2 g/dL — ABNORMAL LOW (ref 6.5–8.1)

## 2016-09-10 LAB — GLUCOSE, CAPILLARY
GLUCOSE-CAPILLARY: 331 mg/dL — AB (ref 65–99)
Glucose-Capillary: 244 mg/dL — ABNORMAL HIGH (ref 65–99)
Glucose-Capillary: 296 mg/dL — ABNORMAL HIGH (ref 65–99)
Glucose-Capillary: 313 mg/dL — ABNORMAL HIGH (ref 65–99)
Glucose-Capillary: 323 mg/dL — ABNORMAL HIGH (ref 65–99)
Glucose-Capillary: 397 mg/dL — ABNORMAL HIGH (ref 65–99)

## 2016-09-10 LAB — UREA NITROGEN, URINE: UREA NITROGEN UR: 266 mg/dL

## 2016-09-10 MED ORDER — CLONIDINE HCL 0.2 MG PO TABS
0.2000 mg | ORAL_TABLET | Freq: Two times a day (BID) | ORAL | Status: DC
  Administered 2016-09-10 – 2016-09-11 (×2): 0.2 mg via ORAL
  Filled 2016-09-10 (×2): qty 1

## 2016-09-10 MED ORDER — SIMETHICONE 80 MG PO CHEW
80.0000 mg | CHEWABLE_TABLET | Freq: Four times a day (QID) | ORAL | Status: DC | PRN
  Administered 2016-09-10 – 2016-09-11 (×2): 80 mg via ORAL
  Filled 2016-09-10 (×3): qty 1

## 2016-09-10 MED ORDER — CEFTRIAXONE SODIUM 2 G IJ SOLR
2.0000 g | INTRAMUSCULAR | Status: DC
  Administered 2016-09-10: 2 g via INTRAVENOUS
  Filled 2016-09-10 (×3): qty 2

## 2016-09-10 MED ORDER — PREDNISONE 50 MG PO TABS
50.0000 mg | ORAL_TABLET | Freq: Every day | ORAL | Status: DC
  Administered 2016-09-11: 50 mg via ORAL
  Filled 2016-09-10: qty 1

## 2016-09-10 MED ORDER — PANTOPRAZOLE SODIUM 40 MG PO TBEC
40.0000 mg | DELAYED_RELEASE_TABLET | Freq: Every day | ORAL | Status: DC
  Administered 2016-09-10 – 2016-09-29 (×20): 40 mg via ORAL
  Filled 2016-09-10 (×21): qty 1

## 2016-09-10 MED ORDER — INSULIN ASPART 100 UNIT/ML ~~LOC~~ SOLN
5.0000 [IU] | Freq: Three times a day (TID) | SUBCUTANEOUS | Status: DC
  Administered 2016-09-10 – 2016-09-25 (×30): 5 [IU] via SUBCUTANEOUS

## 2016-09-10 MED ORDER — INSULIN GLARGINE 100 UNIT/ML ~~LOC~~ SOLN
40.0000 [IU] | Freq: Every day | SUBCUTANEOUS | Status: DC
  Administered 2016-09-10 – 2016-09-14 (×5): 40 [IU] via SUBCUTANEOUS
  Filled 2016-09-10 (×7): qty 0.4

## 2016-09-10 MED ORDER — METOCLOPRAMIDE HCL 5 MG/ML IJ SOLN: 5.0000 mg | Freq: Four times a day (QID) | INTRAMUSCULAR | Status: DC | PRN

## 2016-09-10 MED ORDER — DILTIAZEM HCL ER COATED BEADS 120 MG PO CP24
120.0000 mg | ORAL_CAPSULE | Freq: Every day | ORAL | Status: DC
  Administered 2016-09-10 – 2016-09-11 (×2): 120 mg via ORAL
  Filled 2016-09-10 (×2): qty 1

## 2016-09-10 MED ORDER — LANTHANUM CARBONATE 500 MG PO CHEW
1000.0000 mg | CHEWABLE_TABLET | Freq: Three times a day (TID) | ORAL | Status: DC
  Administered 2016-09-10 – 2016-09-13 (×7): 1000 mg via ORAL
  Filled 2016-09-10 (×10): qty 2

## 2016-09-10 MED ORDER — HYDROMORPHONE HCL 1 MG/ML IJ SOLN
1.0000 mg | INTRAMUSCULAR | Status: DC | PRN
  Administered 2016-09-10 (×2): 2 mg via INTRAVENOUS
  Administered 2016-09-11: 1 mg via INTRAVENOUS
  Filled 2016-09-10: qty 2
  Filled 2016-09-10: qty 1
  Filled 2016-09-10: qty 2
  Filled 2016-09-10: qty 1
  Filled 2016-09-10: qty 2

## 2016-09-10 MED ORDER — FUROSEMIDE 10 MG/ML IJ SOLN
80.0000 mg | Freq: Two times a day (BID) | INTRAMUSCULAR | Status: AC
  Administered 2016-09-10 – 2016-09-11 (×3): 80 mg via INTRAVENOUS
  Filled 2016-09-10 (×3): qty 8

## 2016-09-10 MED ORDER — INSULIN ASPART 100 UNIT/ML ~~LOC~~ SOLN
0.0000 [IU] | SUBCUTANEOUS | Status: DC
  Administered 2016-09-10: 7 [IU] via SUBCUTANEOUS
  Administered 2016-09-10: 3 [IU] via SUBCUTANEOUS
  Administered 2016-09-10: 7 [IU] via SUBCUTANEOUS
  Administered 2016-09-10: 9 [IU] via SUBCUTANEOUS
  Administered 2016-09-10: 7 [IU] via SUBCUTANEOUS
  Administered 2016-09-11: 2 [IU] via SUBCUTANEOUS
  Administered 2016-09-11: 1 [IU] via SUBCUTANEOUS
  Administered 2016-09-11 – 2016-09-12 (×3): 2 [IU] via SUBCUTANEOUS
  Administered 2016-09-12 (×3): 3 [IU] via SUBCUTANEOUS
  Administered 2016-09-12 (×2): 2 [IU] via SUBCUTANEOUS
  Administered 2016-09-13: 3 [IU] via SUBCUTANEOUS
  Administered 2016-09-13: 1 [IU] via SUBCUTANEOUS

## 2016-09-10 MED ORDER — HYDRALAZINE HCL 20 MG/ML IJ SOLN
10.0000 mg | Freq: Four times a day (QID) | INTRAMUSCULAR | Status: DC | PRN
  Administered 2016-09-10 – 2016-09-11 (×4): 10 mg via INTRAVENOUS
  Filled 2016-09-10 (×4): qty 1

## 2016-09-10 NOTE — Progress Notes (Signed)
Patient Name: Gary Lang Date of Encounter: 09/10/2016  Primary Cardiologist: Dr. [facility] Problem List     Active Problems:   Community acquired pneumonia of right lower lobe of lung ([place])   Acute respiratory failure with hypoxia ([place])   Acute cholecystitis due to biliary calculus   AKI (acute kidney injury) [facility])    Patient Profile     66 y/o male with h/o CKD, RA, DM and HTN, who presented on 09/05/16 with complaint of right sided chest and RUQ abdominal pain, fever and chills, found to have right sided PNA and acute cholecystitis, now s/p percutaneous cholecystostomy. Also being treated for Sepsis due to E coli bacteremia. Hospitalization complicated by new onset atrial fibrillation.    Subjective   Feeling better. Diet progressed to regular/ solids. Currently eating breakfast and tolerating well. No n/v.   Inpatient Medications    Scheduled Meds: . aspirin EC  81 mg Oral Daily  . cefTRIAXone (ROCEPHIN)  IV  2 g Intravenous Q24H  . cholecalciferol  1,000 Units Oral Daily  . furosemide  80 mg Intravenous BID  . insulin aspart  0-9 Units Subcutaneous Q4H  . insulin aspart  5 Units Subcutaneous TID WC  . insulin glargine  40 Units Subcutaneous QHS  . lanthanum  1,000 mg Oral TID WC  . levalbuterol  0.63 mg Nebulization Q6H  .  morphine injection  1 mg Intravenous Once  . pantoprazole (PROTONIX) IV  40 mg Intravenous Q24H  . [START ON 09/11/2016] predniSONE  50 mg Oral Q breakfast  . senna-docusate  1 tablet Oral QHS   Continuous Infusions: . diltiazem (CARDIZEM) infusion 10 mg/hr (09/10/16 0149)   PRN Meds: bisacodyl, dextrose, dicyclomine, HYDROmorphone (DILAUDID) injection, metoprolol, ondansetron (ZOFRAN) IV   Vital Signs    Vitals:   09/10/16 0308 09/10/16 0334 09/10/16 0400 09/10/16 0803  BP: (!) 142/64  137/68 (!) 173/68  Pulse: 86  73 77  Resp: 16  16 (!) 23  Temp: 97.6 F (36.4 C)   97.8 F (36.6 C)  TempSrc: Oral   Oral  SpO2: 91% 95%  96% 96%  Weight:      Height:        Intake/Output Summary (Last 24 hours) at 09/10/16 0918 Last data filed at 09/10/16 0830  Gross per 24 hour  Intake          2377.87 ml  Output             2450 ml  Net           -72.13 ml   Filed Weights   09/05/16 0729 09/05/16 1442 09/07/16 1459  Weight: 225 lb (102.1 kg) 225 lb (102.1 kg) 245 lb 9.5 oz (111.4 kg)    Physical Exam   GEN: Well nourished, well developed, in no acute distress.  HEENT: Grossly normal.  Neck: Supple, no JVD, carotid bruits, or masses. Cardiac: RRR, no murmurs, rubs, or gallops. No clubbing, cyanosis, edema.  Radials/DP/PT 2+ and equal bilaterally.  Respiratory:  Respirations regular and unlabored, clear to auscultation bilaterally. GI: Soft, nontender, nondistended, BS + x 4. MS: no deformity or atrophy. Skin: warm and dry, no rash. Neuro:  Strength and sensation are intact. Psych: AAOx3.  Normal affect.  Labs    CBC  Recent Labs  09/08/16 1017 09/09/16 0340  WBC 11.3* 11.4*  HGB 10.4* 10.6*  HCT 31.8* 32.2*  MCV 81.1 81.7  PLT 162 195   Basic Metabolic Panel  Recent Labs  09/07/16 1208  09/09/16 0938 09/09/16 2130 09/10/16 0356  NA  --   < >  --  135 137  K  --   < >  --  4.2 4.5  CL  --   < >  --  105 106  CO2  --   < >  --  16* 18*  GLUCOSE  --   < >  --  410* 299*  BUN  --   < >  --  80* 81*  CREATININE  --   < >  --  7.05* 7.25*  CALCIUM  --   < >  --  8.0* 8.0*  MG 2.1  --  2.3  --   --   < > = values in this interval not displayed. Liver Function Tests  Recent Labs  09/09/16 0340 09/10/16 0356  AST 57* 30  ALT 47 39  ALKPHOS 128* 111  BILITOT 1.2 1.1  PROT 6.4* 6.2*  ALBUMIN 1.7* 1.6*    Recent Labs  09/07/16 1638 09/08/16 1017  LIPASE 77* 71*   Cardiac Enzymes  Recent Labs  09/09/16 0938 09/09/16 1547 09/09/16 2130  TROPONINI 0.04* 0.15* 0.04*   BNP Invalid input(s): POCBNP D-Dimer No results for input(s): DDIMER in the last 72 hours. Hemoglobin  A1C No results for input(s): HGBA1C in the last 72 hours. Fasting Lipid Panel No results for input(s): CHOL, HDL, LDLCALC, TRIG, CHOLHDL, LDLDIRECT in the last 72 hours. Thyroid Function Tests  Recent Labs  09/09/16 0938  TSH 0.457    Telemetry    NSR - Personally Reviewed   Radiology    Ir Perc Cholecystostomy  Result Date: 09/08/2016 INDICATION: ACUTE CHOLECYSTITIS, NON OPERATIVE CANDIDATE EXAM: CHOLECYSTOSTOMY MEDICATIONS: 3.375 G ZOSYN; The antibiotic was administered within an appropriate time frame prior to the initiation of the procedure. ANESTHESIA/SEDATION: Moderate (conscious) sedation was employed during this procedure. A total of Versed 1.0 mg and Fentanyl 25 mcg was administered intravenously. Moderate Sedation Time: 10 minutes. The patient's level of consciousness and vital signs were monitored continuously by radiology nursing throughout the procedure under my direct supervision. FLUOROSCOPY TIME:  Fluoroscopy Time:  48 seconds (10 mGy). COMPLICATIONS: None immediate. PROCEDURE: Informed written consent was obtained from the patient after a thorough discussion of the procedural risks, benefits and alternatives. All questions were addressed. Maximal Sterile Barrier Technique was utilized including caps, mask, sterile gowns, sterile gloves, sterile drape, hand hygiene and skin antiseptic. A timeout was performed prior to the initiation of the procedure. Previous imaging reviewed. Preliminary ultrasound performed. The abnormal gallbladder was localized in the right upper quadrant through a lower intercostal space. Overlying skin marked. Under sterile conditions and local anesthesia, percutaneous transhepatic needle access performed of the gallbladder under direct ultrasound. Needle position confirmed with ultrasound. There was return of bile. Guidewire inserted followed by the Accustick dilator set. Amplatz guidewire exchange performed. Tract dilatation performed to insert a 10 [place]  drain. Drain catheter position confirmed with ultrasound and fluoroscopy. There was return of exudative bile. Sample sent for Gram stain and culture. Catheter secured with a Prolene suture and connected to external gravity drainage. Sterile dressing applied. No immediate complication. IMPRESSION: Successful ultrasound and fluoroscopic 10 French transhepatic cholecystostomy. Electronically Signed   By: Jerilynn Mages.  Shick M.D.   On: 09/08/2016 12:14   Dg Chest Port 1 View  Result Date: 09/09/2016 CLINICAL DATA:  Shortness of breath, wheezing. EXAM: PORTABLE CHEST 1 VIEW COMPARISON:  Radiograph August 07, 2017. FINDINGS:  Stable cardiomediastinal silhouette. No pneumothorax is noted. Stable left midlung atelectasis is noted. Stable right basilar opacity is noted concerning for atelectasis or infiltrate. Bony thorax is unremarkable. IMPRESSION: Stable left midlung and right basilar opacities are noted concerning for atelectasis or possibly infiltrate. Electronically Signed   By: Marijo Conception, M.D.   On: 09/09/2016 09:50    Cardiac Studies   2D Echo 09/09/16  Study Conclusions  - Left ventricle: The cavity size was normal. Wall thickness was   increased in a pattern of moderate LVH. Systolic function was   vigorous. The estimated ejection fraction was in the range of 65%   to 70%. Wall motion was normal; there were no regional wall   motion abnormalities. Doppler parameters are consistent with   abnormal left ventricular relaxation (grade 1 diastolic   dysfunction). - Aortic valve: Valve area (VTI): 2.67 cm^2. Valve area (Vmax):   2.21 cm^2. Valve area (Vmean): 2.53 cm^2. - Atrial septum: No defect or patent foramen ovale was identified. - Pulmonary arteries: Systolic pressure was moderately increased.   PA peak pressure: 40 mm Hg (S). - Technically adequate study.  Patient Profile     66 y/o male with h/o CKD, RA, DM and HTN, who presented on 09/05/16 with complaint of right sided chest and RUQ  abdominal pain, fever and chills, found to have right sided PNA and acute cholecystitis, now s/p percutaneous cholecystostomy. Also being treated for Sepsis due to E coli bacteremia. Hospitalization complicated by new onset atrial fibrillation.   Assessment & Plan    Atrial Fibrillation: pt notes long history of having an irregular heart beat, but no previously documented reports of atrial fibrillation/flutter. New diagnosis of atrial fibrillation noted this admission, in the setting of acute right sided CAP and acute cholecystitis. He is being treated conservatively with antibiotics and percutaneous chole drain (not a current surgical candidate for lap chole, at this time, given active PNA). He has spontaneously converted back to NSR on telemetry. He has been advanced to a regular diet and tolerating POs well. 2D echo shows normal LVEF and normal wall motion. Given normal EF, ok to transition from IV to PO Cardizem for rate control. Transition to PO 120 mg daily with 2h IV Cardizem overlap. Continue to treat underlying medical illnesses and monitor on telemetry for recurrence.  No ischemic CP or dyspnea to suggest underlying CAD. No plans for inpatient ischemic w/u at this time. His CHA2DS2 VASc score is at least 3 for HTN, DM and age 49-74. Given CKD, his options are limited to Coumadin. Recommend initiation of Coumadin, per pharmacy, once cleared from a surgical standpoint. INR goal of 2-3. We will continue to follow along with you.   Signed, Lyda Jester, PA-C  09/10/2016, 9:18 AM   History and all data above reviewed.  Patient examined.  I agree with the findings as above.  Abdomen is better and he is eating.   The patient exam reveals COR:RRR  ,  Lungs: Clear  ,  Abd: Positive bowel sounds, no rebound no guarding, Ext No edema   .  All available labs, radiology testing, previous records reviewed. Agree with documented assessment and plan. Atrial fib:  Now back in NSR.  Given his report of  irregular rhythm in the past, I would assume that he has intermittent atrial fib.  Agree with plans for anticoagulation.  Change to PO Cardizem as above.   Jeneen Rinks Paschal Blanton  11:24 AM  09/10/2016

## 2016-09-10 NOTE — Progress Notes (Signed)
Patient ID: Gary Lang, male   DOB: [birth year], 66 y.o.   MRN: [id number] [facility] Progress Note   Assessment/ Plan:   1. 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 Acute renal insufficiency appears to be associated with sepsis/ATN. Overnight, excellent response to furosemide with some increase in his creatinine-trajectory of this rise appears to be slowing down indicating an approach of the plateau phase of ATN. Continue to monitor closely for emergent needs to intervene with dialysis. He has a left radiocephalic fistula in place. Clinically, doing well without any uremic symptoms or signs of volume overload. 2. Right lower lobe pneumonia/acute cholecystitis: On broad-spectrum antibiotic therapy and status post percutaneous cholecystostomy drain. Reports to be feeling much better clinically with intermittent cough. 3. Hyperkalemia: Appears to be associated with concomitant hyperglycemia, direct efforts at glycemic control. 4. Hyponatremia/volume overload: Secondary to free water excretion defect associated with acute renal insufficiency. Sodium levels improving with diuresis. 5. Hypertension: Attempt efforts at optimization of diuretic therapy in addition with ongoing anti-hypertensive management. 6. Hyperphosphatemia: We'll start transiently on phosphorus binders. Subjective:   Reports to be feeling well-slept very well last night    Objective:   BP (!) 173/68 (BP Location: Right Leg)   Pulse 77   Temp 97.8 F (36.6 C) (Oral)   Resp (!) 23   Ht '6\' 1"'$  (1.854 m)   Wt 111.4 kg (245 lb 9.5 oz)   SpO2 96%   BMI 32.40 kg/m   Intake/Output Summary (Last 24 hours) at 09/10/16 0849 Last data filed at 09/10/16 0830  Gross per 24 hour  Intake          2377.87 ml  Output             2450 ml  Net           -72.13 ml   Weight change:   Physical Exam: HYI:FOYDXAJOINO resting in bed,  nurse at bedside CVS: Pulse irregularly irregular, normal rate Resp: Clear to auscultation, no rales Abd: Soft, obese, nontender, RUQ drain in situ Ext: Trace to 1+ lower extremity edema  Imaging: Ir Perc Cholecystostomy  Result Date: 09/08/2016 INDICATION: ACUTE CHOLECYSTITIS, NON OPERATIVE CANDIDATE EXAM: CHOLECYSTOSTOMY MEDICATIONS: 3.375 G ZOSYN; The antibiotic was administered within an appropriate time frame prior to the initiation of the procedure. ANESTHESIA/SEDATION: Moderate (conscious) sedation was employed during this procedure. A total of Versed 1.0 mg and Fentanyl 25 mcg was administered intravenously. Moderate Sedation Time: 10 minutes. The patient's level of consciousness and vital signs were monitored continuously by radiology nursing throughout the procedure under my direct supervision. FLUOROSCOPY TIME:  Fluoroscopy Time:  48 seconds (10 mGy). COMPLICATIONS: None immediate. PROCEDURE: Informed written consent was obtained from the patient after a thorough discussion of the procedural risks, benefits and alternatives. All questions were addressed. Maximal Sterile Barrier Technique was utilized including caps, mask, sterile gowns, sterile gloves, sterile drape, hand hygiene and skin antiseptic. A timeout was performed prior to the initiation of the procedure. Previous imaging reviewed. Preliminary ultrasound performed. The abnormal gallbladder was localized in the right upper quadrant through a lower intercostal space. Overlying skin marked. Under sterile conditions and local anesthesia, percutaneous transhepatic needle access performed of the gallbladder under direct ultrasound. Needle position confirmed with ultrasound. There was return of bile. Guidewire inserted followed by the Accustick dilator set. Amplatz guidewire exchange performed. Tract dilatation performed to insert a 10 [place] drain. Drain catheter position confirmed  with ultrasound and fluoroscopy. There was return of exudative  bile. Sample sent for Gram stain and culture. Catheter secured with a Prolene suture and connected to external gravity drainage. Sterile dressing applied. No immediate complication. IMPRESSION: Successful ultrasound and fluoroscopic 10 French transhepatic cholecystostomy. Electronically Signed   By: Jerilynn Mages.  Shick M.D.   On: 09/08/2016 12:14   Dg Chest Port 1 View  Result Date: 09/09/2016 CLINICAL DATA:  Shortness of breath, wheezing. EXAM: PORTABLE CHEST 1 VIEW COMPARISON:  Radiograph August 07, 2017. FINDINGS: Stable cardiomediastinal silhouette. No pneumothorax is noted. Stable left midlung atelectasis is noted. Stable right basilar opacity is noted concerning for atelectasis or infiltrate. Bony thorax is unremarkable. IMPRESSION: Stable left midlung and right basilar opacities are noted concerning for atelectasis or possibly infiltrate. Electronically Signed   By: Marijo Conception, M.D.   On: 09/09/2016 09:50    Labs: BMET  Recent Labs Lab 09/06/16 0621 09/07/16 0719 09/07/16 1638 09/08/16 1017 09/09/16 0340 09/09/16 2130 09/10/16 0356  NA 139 136 138 140 134* 135 137  K 4.5 3.9 4.1 4.1 5.0 4.2 4.5  CL 109 103 105 110 106 105 106  CO2 21* 24 24 20* 17* 16* 18*  GLUCOSE 245* 300* 265* 102* 376* 410* 299*  BUN 29* 39* 44* 51* 65* 80* 81*  CREATININE 2.67* 3.93* 4.48* 5.95* 6.67* 7.05* 7.25*  CALCIUM 7.8* 8.2* 8.2* 8.3* 8.0* 8.0* 8.0*   CBC  Recent Labs Lab 09/05/16 0721 09/06/16 0621 09/07/16 0719 09/08/16 1017 09/09/16 0340  WBC 12.5* 10.8* 11.4* 11.3* 11.4*  NEUTROABS 11.4*  --   --   --   --   HGB 13.5 11.8* 11.4* 10.4* 10.6*  HCT 40.8 36.6* 35.3* 31.8* 32.2*  MCV 84.1 84.7 83.8 81.1 81.7  PLT 137* 127* 130* 162 180    Medications:    . aspirin EC  81 mg Oral Daily  . cholecalciferol  1,000 Units Oral Daily  . furosemide  80 mg Intravenous BID  . insulin glargine  25 Units Subcutaneous QHS  . levalbuterol  0.63 mg Nebulization Q6H  . methylPREDNISolone (SOLU-MEDROL)  injection  40 mg Intravenous Q12H  .  morphine injection  1 mg Intravenous Once  . pantoprazole (PROTONIX) IV  40 mg Intravenous Q24H  . piperacillin-tazobactam (ZOSYN)  IV  2.25 g Intravenous Q6H  . senna-docusate  1 tablet Oral QHS   Elmarie Shiley, MD 09/10/2016, 8:49 AM

## 2016-09-10 NOTE — Progress Notes (Signed)
PROGRESS NOTE    Gary Lang  JWJ:[id number] DOB: July 23, 1951 DOA: 09/05/2016 PCP: [name], MD   Brief Narrative: 66 y.o. male with medical history significant of chronic kidney disease, diabetes, hypertension, rheumatoid arthritis, ex-smoker quit about 20 years ago presented with fever up to 102 at home associated with chills and right-sided chest pain.Patient with RUQ pain and nausea. CT imaging consistent with acute cholecystitis. Also with hypoxia requiring about 5-6 liters of oxygen. Transferred to [facility] for surgery evaluation  Assessment & Plan:   Active Problems:   Community acquired pneumonia of right lower lobe of lung ([place])   Acute respiratory failure with hypoxia ([place])   Acute cholecystitis due to biliary calculus   AKI (acute kidney injury) ([place])    #Sepsis due to E Coli bacteremia and possibly commonly acquired pneumonia/acute cholecystitis: Chest x-ray showed right lower lobe pneumonia. Patient with tachycardia, leukocytosis, fever and abnormal x-ray finding on admission -2 out of 2 blood cultures growing gram-negative rods consistent with Escherichia coli. Follow up final culture and sensitivity. Change Zosyn to Rocephin based on sensitivity from 09/05/16 Recent CT scan of abdomen and pelvis with no acute finding.  source of Escherichia coli bacteremia likely gallbladder. -Respiratory viral studies negative. Surgery  Donnie Mesa, MD recommended percutaneous cholecystostomy to decompress the gallbladder.patient is now  S/p perc cholecystostomy 09/08/16. After he has fully recovered from his pneumonia,   we can consider elective laparoscopic cholecystectomy  Hold off on full dose anticoagulation with Coumadin, pending decision by surgery to remove gallbladder    #Acute hypoxic respiratory failure likely in the setting of pneumonia and sepsis: Patient with higher requirement of oxygen last night. Currently on 4 L. Reported worsening shortness of breath. Repeat chest  x-ray with no acute change. Continue treatment for pneumonia. Continue to monitor.  CT chest shows multifocal atelectasis, possible superimposed pneumonia. 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 Acute renal insufficiency appears to be associated with sepsis/ATN. Overnight, excellent response to furosemide with some increase in his creatinine-trajectory of this rise appears to be slowing down indicating an approach of the plateau phase of ATN -Serum creatinine level worsened today. Presented with a creatinine of 1.25. Creatinine now  7.25 Urine output 1625  mL over 24 hours,   Continue strict I's and O's   Avoid nephrotoxins. -Patient required hemodialysis in the past and he has left upper extremity AV fistula. Nephrology Dr. Posey Pronto consulted, managing diuretics   #Elevated troponin likely in the setting of sepsis: Trend cardiac enzymes in the setting of new onset atrial fibrillation -Echocardiogram showed vigorous left ventricular systolic function with EF of 65-30%. There were no regional wall motion abnormalities. Also with  grade 1 diastolic dysfunction. -Patient had tachycardia with heart rate of 160s this morning. Improved with metoprolol 5 IV. Continue diltiazem  . Does not have chest pain.   #Hyperkalemia: Now hyperkalemia. We will give 1 dose of Kayexalate   New-onset A fib? Back in NSR spontaneously Trend troponins, cardiology consulted, determine timing of long-term anticoagulation Was on by mouth Cardizem prior to admission Currently on Cardizem drip and rate controlled Given normal EF, ok to transition from IV to PO Cardizem for rate control. Transition to PO 120 mg daily with 2h IV Cardizem overlap. No plans for inpatient ischemic w/u at this time Cardiology  Recommends initiation of Coumadin, per pharmacy, once cleared from a surgical  standpoint. INR goal of 2-3.  Uncontrolled diabetes Was placed on insulin drip yesterday primarily for stabilizing glucose, did not suspect dka , bicarbonate is low secondary to acute kidney injury Continue to decrease steroids Now transition to Lantus, sliding scale insulin   DVT prophylaxis: Heparin subcutaneous Code Status: Full code Family Communication: No family present at bedside Disposition Plan: Continue stepdown    Consultants:   Nephrology  Cardiology    Procedures: None Antimicrobials: Azithromycin and Zosyn. 1/9-Rocephin  Subjective:   Now in NSR , denies any chest pain or shortness of breath  Objective: Vitals:   09/10/16 0308 09/10/16 0334 09/10/16 0400 09/10/16 0803  BP: (!) 142/64  137/68 (!) 173/68  Pulse: 86  73 77  Resp: 16  16 (!) 23  Temp: 97.6 F (36.4 C)   97.8 F (36.6 C)  TempSrc: Oral   Oral  SpO2: 91% 95% 96% 96%  Weight:      Height:        Intake/Output Summary (Last 24 hours) at 09/10/16 0904 Last data filed at 09/10/16 0830  Gross per 24 hour  Intake          2377.87 ml  Output             2450 ml  Net           -72.13 ml   Filed Weights   09/05/16 0729 09/05/16 1442 09/07/16 1459  Weight: 102.1 kg (225 lb) 102.1 kg (225 lb) 111.4 kg (245 lb 9.5 oz)    Examination:  General exam: Ill-looking male, lying in bed, not in distress Respiratory system: Clear bilaterally except mild intermittent wheeze, speaking full sentences. Cardiovascular system: S1 and S2 normal. Regular rate rhythm. No pedal edema. Gastrointestinal system: Abdomen soft, nontender, nondistended. Bowel sound positive.  Central nervous system: Alert and oriented. No focal neurological deficits. Extremities: Symmetric 5 x 5 power. Skin: No rashes, lesions or ulcers Psychiatry: Judgement and insight appear normal. Mood & affect appropriate.     Data Reviewed: I have personally reviewed following labs and imaging studies  CBC:  Recent Labs Lab  09/05/16 0721 09/06/16 0621 09/07/16 0719 09/08/16 1017 09/09/16 0340  WBC 12.5* 10.8* 11.4* 11.3* 11.4*  NEUTROABS 11.4*  --   --   --   --   HGB 13.5 11.8* 11.4* 10.4* 10.6*  HCT 40.8 36.6* 35.3* 31.8* 32.2*  MCV 84.1 84.7 83.8 81.1 81.7  PLT 137* 127* 130* 162 481   Basic Metabolic Panel:  Recent Labs Lab 09/06/16 0621  09/07/16 1208 09/07/16 1638 09/08/16 1017 09/09/16 0340 09/09/16 0938 09/09/16 2130 09/10/16 0356  NA 139  < >  --  138 140 134*  --  135 137  K 4.5  < >  --  4.1 4.1 5.0  --  4.2 4.5  CL 109  < >  --  105 110 106  --  105 106  CO2 21*  < >  --  24 20* 17*  --  16* 18*  GLUCOSE 245*  < >  --  265* 102* 376*  --  410* 299*  BUN 29*  < >  --  44* 51* 65*  --  80* 81*  CREATININE 2.67*  < >  --  4.48* 5.95* 6.67*  --  7.05* 7.25*  CALCIUM 7.8*  < >  --  8.2* 8.3* 8.0*  --  8.0* 8.0*  MG 1.7  --  2.1  --   --   --  2.3  --   --   < > = values in this interval not displayed. GFR: Estimated Creatinine Clearance: 13.1 mL/min (by C-G formula based on SCr of 7.25 mg/dL (H)). Liver Function Tests:  Recent Labs Lab 09/05/16 0721 09/07/16 1638 09/09/16 0340 09/10/16 0356  AST 23 64* 57* 30  ALT 21 45 47 39  ALKPHOS 86 107 128* 111  BILITOT 2.4* 2.2* 1.2 1.1  PROT 7.4 6.3* 6.4* 6.2*  ALBUMIN 3.0* 2.1* 1.7* 1.6*    Recent Labs Lab 09/05/16 0721 09/07/16 1638 09/08/16 1017  LIPASE 22 77* 71*   No results for input(s): AMMONIA in the last 168 hours. Coagulation Profile:  Recent Labs Lab 09/08/16 1017  INR 1.11   Cardiac Enzymes:  Recent Labs Lab 09/05/16 1517 09/05/16 2023 09/09/16 0938 09/09/16 1547 09/09/16 2130  TROPONINI 0.05* 0.05* 0.04* 0.15* 0.04*   BNP (last 3 results) No results for input(s): PROBNP in the last 8760 hours. HbA1C: No results for input(s): HGBA1C in the last 72 hours. CBG:  Recent Labs Lab 09/09/16 1948 09/09/16 2055 09/09/16 2155 09/09/16 2309 09/10/16 0017  GLUCAP 415* 401* 351* 331* 296*    Lipid Profile: No results for input(s): CHOL, HDL, LDLCALC, TRIG, CHOLHDL, LDLDIRECT in the last 72 hours. Thyroid Function Tests:  Recent Labs  09/09/16 0938  TSH 0.457  FREET4 0.76   Anemia Panel: No results for input(s): VITAMINB12, FOLATE, FERRITIN, TIBC, IRON, RETICCTPCT in the last 72 hours. Sepsis Labs:  Recent Labs Lab 09/05/16 0738 09/05/16 1010  LATICACIDVEN 2.80* 0.99    Recent Results (from the past 240 hour(s))  Urine culture     Status: Abnormal   Collection Time: 09/03/16 12:36 AM  Result Value Ref Range Status   Specimen Description URINE, CLEAN CATCH  Final   Special Requests NONE  Final   Culture (A)  Final    <10,000 COLONIES/mL INSIGNIFICANT GROWTH Performed at [facility]    Report Status 09/04/2016 FINAL  Final  Blood Culture (routine x 2)     Status: Abnormal   Collection Time: 09/05/16  7:21 AM  Result Value Ref Range Status   Specimen Description BLOOD RIGHT FOREARM  Final   Special Requests BOTTLES DRAWN AEROBIC AND ANAEROBIC 6CC  Final   Culture  Setup Time   Final    Performed at [place] IN BOTH AEROBIC AND ANAEROBIC BOTTLES Gram Stain Report Called to,Read Back By and Verified With: STONE,B AT 1930 ON 09/05/2016 BY ISLEY,B CRITICAL RESULT CALLED TO, READ BACK BY AND VERIFIED WITH: N LIGHTNER,RN '@[id number]'$  09/06/16 MKELLY,MLT Performed at [place] (A)  Final   Report Status 09/08/2016 FINAL  Final   Organism ID, Bacteria ESCHERICHIA COLI  Final      Susceptibility   Escherichia coli - MIC*    AMPICILLIN >=32 RESISTANT Resistant     CEFAZOLIN <=4 SENSITIVE Sensitive     CEFEPIME <=1 SENSITIVE Sensitive     CEFTAZIDIME <=1 SENSITIVE Sensitive     CEFTRIAXONE <=1 SENSITIVE Sensitive     CIPROFLOXACIN <=0.25 SENSITIVE Sensitive     GENTAMICIN <=1 SENSITIVE Sensitive     IMIPENEM <=0.25 SENSITIVE Sensitive     TRIMETH/SULFA <=20 SENSITIVE Sensitive      AMPICILLIN/SULBACTAM 16 INTERMEDIATE Intermediate     PIP/TAZO <=4 SENSITIVE Sensitive     Extended ESBL NEGATIVE Sensitive     * ESCHERICHIA COLI  Blood Culture ID Panel (Reflexed)  Status: Abnormal   Collection Time: 09/05/16  7:21 AM  Result Value Ref Range Status   Enterococcus species NOT DETECTED NOT DETECTED Final   Listeria monocytogenes NOT DETECTED NOT DETECTED Final   Staphylococcus species NOT DETECTED NOT DETECTED Final   Staphylococcus aureus NOT DETECTED NOT DETECTED Final   Streptococcus species NOT DETECTED NOT DETECTED Final   Streptococcus agalactiae NOT DETECTED NOT DETECTED Final   Streptococcus pneumoniae NOT DETECTED NOT DETECTED Final   Streptococcus pyogenes NOT DETECTED NOT DETECTED Final   Acinetobacter baumannii NOT DETECTED NOT DETECTED Final   Enterobacteriaceae species DETECTED (A) NOT DETECTED Final    Comment: CRITICAL RESULT CALLED TO, READ BACK BY AND VERIFIED WITH: N LIGHTNER,RN '@0312'$  09/06/16 MKELLY,MLT    Enterobacter cloacae complex NOT DETECTED NOT DETECTED Final   Escherichia coli DETECTED (A) NOT DETECTED Final    Comment: CRITICAL RESULT CALLED TO, READ BACK BY AND VERIFIED WITH: N LIGHTNER,RN '@0312'$  09/06/16 MKELLY,MLT    Klebsiella oxytoca NOT DETECTED NOT DETECTED Final   Klebsiella pneumoniae NOT DETECTED NOT DETECTED Final   Proteus species NOT DETECTED NOT DETECTED Final   Serratia marcescens NOT DETECTED NOT DETECTED Final   Carbapenem resistance NOT DETECTED NOT DETECTED Final   Haemophilus influenzae NOT DETECTED NOT DETECTED Final   Neisseria meningitidis NOT DETECTED NOT DETECTED Final   Pseudomonas aeruginosa NOT DETECTED NOT DETECTED Final   Candida albicans NOT DETECTED NOT DETECTED Final   Candida glabrata NOT DETECTED NOT DETECTED Final   Candida krusei NOT DETECTED NOT DETECTED Final   Candida parapsilosis NOT DETECTED NOT DETECTED Final   Candida tropicalis NOT DETECTED NOT DETECTED Final    Comment: Performed at  [facility]  Blood Culture (routine x 2)     Status: Abnormal   Collection Time: 09/05/16  7:25 AM  Result Value Ref Range Status   Specimen Description BLOOD RIGHT HAND  Final   Special Requests BOTTLES DRAWN AEROBIC AND ANAEROBIC 8CC  Final   Culture  Setup Time   Final    Performed at [place] IN BOTH AEROBIC AND ANAEROBIC BOTTLES Gram Stain Report Called to,Read Back By and Verified With: STONE,B AT 1930 ON 09/05/2016 BY ISLEY,B    Culture (A)  Final    ESCHERICHIA COLI SUSCEPTIBILITIES PERFORMED ON PREVIOUS CULTURE WITHIN THE LAST 5 DAYS. Performed at [facility]    Report Status 09/08/2016 FINAL  Final  Urine culture     Status: Abnormal   Collection Time: 09/05/16  1:10 PM  Result Value Ref Range Status   Specimen Description URINE, RANDOM  Final   Special Requests NONE  Final   Culture MULTIPLE SPECIES PRESENT, SUGGEST RECOLLECTION (A)  Final   Report Status 09/07/2016 FINAL  Final  Respiratory Panel by PCR     Status: None   Collection Time: 09/05/16  2:22 PM  Result Value Ref Range Status   Adenovirus NOT DETECTED NOT DETECTED Final   Coronavirus 229E NOT DETECTED NOT DETECTED Final   Coronavirus HKU1 NOT DETECTED NOT DETECTED Final   Coronavirus NL63 NOT DETECTED NOT DETECTED Final   Coronavirus OC43 NOT DETECTED NOT DETECTED Final   Metapneumovirus NOT DETECTED NOT DETECTED Final   Rhinovirus / Enterovirus NOT DETECTED NOT DETECTED Final   Influenza A NOT DETECTED NOT DETECTED Final   Influenza B NOT DETECTED NOT DETECTED Final   Parainfluenza Virus 1 NOT DETECTED NOT DETECTED Final   Parainfluenza Virus 2 NOT DETECTED NOT DETECTED Final  Parainfluenza Virus 3 NOT DETECTED NOT DETECTED Final   Parainfluenza Virus 4 NOT DETECTED NOT DETECTED Final   Respiratory Syncytial Virus NOT DETECTED NOT DETECTED Final   Bordetella pertussis NOT DETECTED NOT DETECTED Final   Chlamydophila pneumoniae NOT DETECTED NOT DETECTED Final    Mycoplasma pneumoniae NOT DETECTED NOT DETECTED Final    Comment: Performed at [facility]  Culture, blood (routine x 2)     Status: None (Preliminary result)   Collection Time: 09/07/16 11:58 AM  Result Value Ref Range Status   Specimen Description BLOOD RIGHT WRIST  Final   Special Requests BOTTLES DRAWN AEROBIC AND ANAEROBIC [place]  Final   Culture NO GROWTH 3 DAYS  Final   Report Status PENDING  Incomplete  Culture, blood (routine x 2)     Status: None (Preliminary result)   Collection Time: 09/07/16 12:08 PM  Result Value Ref Range Status   Specimen Description BLOOD RIGHT HAND  Final   Special Requests BOTTLES DRAWN AEROBIC ONLY 4CC ONLY  Final   Culture NO GROWTH 3 DAYS  Final   Report Status PENDING  Incomplete  Aerobic/Anaerobic Culture (surgical/deep wound)     Status: None (Preliminary result)   Collection Time: 09/08/16 12:11 PM  Result Value Ref Range Status   Specimen Description ABSCESS GALL BLADDER  Final   Special Requests Normal  Final   Gram Stain   Final    RARE WBC PRESENT,BOTH PMN AND MONONUCLEAR NO ORGANISMS SEEN    Culture MODERATE ESCHERICHIA COLI  Final   Report Status PENDING  Incomplete  MRSA PCR Screening     Status: None   Collection Time: 09/09/16  4:49 PM  Result Value Ref Range Status   MRSA by PCR NEGATIVE NEGATIVE Final    Comment:        The GeneXpert MRSA Assay (FDA approved for NASAL specimens only), is one component of a comprehensive MRSA colonization surveillance program. It is not intended to diagnose MRSA infection nor to guide or monitor treatment for MRSA infections.          Radiology Studies: Ir Perc Cholecystostomy  Result Date: 09/08/2016 INDICATION: ACUTE CHOLECYSTITIS, NON OPERATIVE CANDIDATE EXAM: CHOLECYSTOSTOMY MEDICATIONS: 3.375 G ZOSYN; The antibiotic was administered within an appropriate time frame prior to the initiation of the procedure. ANESTHESIA/SEDATION: Moderate (conscious) sedation was  employed during this procedure. A total of Versed 1.0 mg and Fentanyl 25 mcg was administered intravenously. Moderate Sedation Time: 10 minutes. The patient's level of consciousness and vital signs were monitored continuously by radiology nursing throughout the procedure under my direct supervision. FLUOROSCOPY TIME:  Fluoroscopy Time:  48 seconds (10 mGy). COMPLICATIONS: None immediate. PROCEDURE: Informed written consent was obtained from the patient after a thorough discussion of the procedural risks, benefits and alternatives. All questions were addressed. Maximal Sterile Barrier Technique was utilized including caps, mask, sterile gowns, sterile gloves, sterile drape, hand hygiene and skin antiseptic. A timeout was performed prior to the initiation of the procedure. Previous imaging reviewed. Preliminary ultrasound performed. The abnormal gallbladder was localized in the right upper quadrant through a lower intercostal space. Overlying skin marked. Under sterile conditions and local anesthesia, percutaneous transhepatic needle access performed of the gallbladder under direct ultrasound. Needle position confirmed with ultrasound. There was return of bile. Guidewire inserted followed by the Accustick dilator set. Amplatz guidewire exchange performed. Tract dilatation performed to insert a 10 [place] drain. Drain catheter position confirmed with ultrasound and fluoroscopy. There was return of exudative bile.  Sample sent for Gram stain and culture. Catheter secured with a Prolene suture and connected to external gravity drainage. Sterile dressing applied. No immediate complication. IMPRESSION: Successful ultrasound and fluoroscopic 10 French transhepatic cholecystostomy. Electronically Signed   By: Jerilynn Mages.  Shick M.D.   On: 09/08/2016 12:14   Dg Chest Port 1 View  Result Date: 09/09/2016 CLINICAL DATA:  Shortness of breath, wheezing. EXAM: PORTABLE CHEST 1 VIEW COMPARISON:  Radiograph August 07, 2017. FINDINGS:  Stable cardiomediastinal silhouette. No pneumothorax is noted. Stable left midlung atelectasis is noted. Stable right basilar opacity is noted concerning for atelectasis or infiltrate. Bony thorax is unremarkable. IMPRESSION: Stable left midlung and right basilar opacities are noted concerning for atelectasis or possibly infiltrate. Electronically Signed   By: Marijo Conception, M.D.   On: 09/09/2016 09:50        Scheduled Meds: . aspirin EC  81 mg Oral Daily  . cefTRIAXone (ROCEPHIN)  IV  2 g Intravenous Q24H  . cholecalciferol  1,000 Units Oral Daily  . furosemide  80 mg Intravenous BID  . insulin aspart  0-9 Units Subcutaneous Q4H  . insulin aspart  5 Units Subcutaneous TID WC  . insulin glargine  40 Units Subcutaneous QHS  . lanthanum  1,000 mg Oral TID WC  . levalbuterol  0.63 mg Nebulization Q6H  .  morphine injection  1 mg Intravenous Once  . pantoprazole (PROTONIX) IV  40 mg Intravenous Q24H  . [START ON 09/11/2016] predniSONE  50 mg Oral Q breakfast  . senna-docusate  1 tablet Oral QHS   Continuous Infusions: . diltiazem (CARDIZEM) infusion 10 mg/hr (09/10/16 0149)     LOS: 5 days    Reyne Dumas, MD [facility] Pager [phone]  If 7PM-7AM, please contact night-coverage [email or web address] Password [email or web address] 09/10/2016, 9:04 AM

## 2016-09-10 NOTE — Progress Notes (Signed)
Results for AMARIE, TARTE (MRN [id number]) as of 09/10/2016 13:38  Ref. Range 09/09/2016 21:55 09/09/2016 23:09 09/10/2016 00:17 09/10/2016 08:01  Glucose-Capillary Latest Ref Range: 65 - 99 mg/dL 351 (H) 331 (H) 296 (H) 313 (H)   Noted that blood sugars are elevated. Recommend increasing Novolog correction scale to MODERATE TID & HS if eating, and increasing Novolog meal coverage to 7 units TID if eating at least 50% of meals. Will continue to monitor blood sugars while in the hospital. Harvel Ricks RN BSN CDE

## 2016-09-10 NOTE — Progress Notes (Signed)
Patient ID: Gary Lang, male   DOB: 1951-08-26, 66 y.o.   MRN: [id number]  [facility] Surgery Progress Note     Subjective: "I feel great!" Denies any abdominal pain, nausea, or vomiting. Tolerating diet. Patient had multiple BM's yesterday. No longer on O2. Denies SOB.  Objective: Vital signs in last 24 hours: Temp:  [97.5 F (36.4 C)-97.9 F (36.6 C)] 97.8 F (36.6 C) (01/09 0803) Pulse Rate:  [62-154] 77 (01/09 0803) Resp:  [16-27] 23 (01/09 0803) BP: (126-173)/(62-76) 173/68 (01/09 0803) SpO2:  [91 %-97 %] 92 % (01/09 0944) Last BM Date: 09/09/16  Intake/Output from previous day: 01/08 0701 - 01/09 0700 In: 1937.9 [P.O.:480; I.V.:1252.9; IV Piggyback:200] Out: 2000 [Urine:1625; Drains:375] Intake/Output this shift: Total I/O In: 440 [P.O.:360; I.V.:25; Other:5; IV Piggyback:50] Out: 450 [Urine:425; Drains:25]  PE: Gen:  Alert, NAD, pleasant Card:  RRR, no M/G/R heard Pulm:  CTAB, no W/R/R, effort normal Abd: Soft, mild distension, nontender, +BS, no HSM, drain site clean, drain with minimal yellow/slightly orange tinged fluid in bag  Lab Results:   Recent Labs  09/08/16 1017 09/09/16 0340  WBC 11.3* 11.4*  HGB 10.4* 10.6*  HCT 31.8* 32.2*  PLT 162 180   BMET  Recent Labs  09/09/16 2130 09/10/16 0356  NA 135 137  K 4.2 4.5  CL 105 106  CO2 16* 18*  GLUCOSE 410* 299*  BUN 80* 81*  CREATININE 7.05* 7.25*  CALCIUM 8.0* 8.0*   PT/INR  Recent Labs  09/08/16 1017  LABPROT 14.4  INR 1.11   CMP     Component Value Date/Time   NA 137 09/10/2016 0356   K 4.5 09/10/2016 0356   CL 106 09/10/2016 0356   CO2 18 (L) 09/10/2016 0356   GLUCOSE 299 (H) 09/10/2016 0356   BUN 81 (H) 09/10/2016 0356   CREATININE 7.25 (H) 09/10/2016 0356   CALCIUM 8.0 (L) 09/10/2016 0356   PROT 6.2 (L) 09/10/2016 0356   ALBUMIN 1.6 (L) 09/10/2016 0356   AST 30 09/10/2016 0356   ALT 39 09/10/2016 0356   ALKPHOS 111 09/10/2016 0356   BILITOT 1.1 09/10/2016  0356   GFRNONAA 7 (L) 09/10/2016 0356   GFRAA 8 (L) 09/10/2016 0356   Lipase     Component Value Date/Time   LIPASE 71 (H) 09/08/2016 1017       Studies/Results: Ir Perc Cholecystostomy  Result Date: 09/08/2016 INDICATION: ACUTE CHOLECYSTITIS, NON OPERATIVE CANDIDATE EXAM: CHOLECYSTOSTOMY MEDICATIONS: 3.375 G ZOSYN; The antibiotic was administered within an appropriate time frame prior to the initiation of the procedure. ANESTHESIA/SEDATION: Moderate (conscious) sedation was employed during this procedure. A total of Versed 1.0 mg and Fentanyl 25 mcg was administered intravenously. Moderate Sedation Time: 10 minutes. The patient's level of consciousness and vital signs were monitored continuously by radiology nursing throughout the procedure under my direct supervision. FLUOROSCOPY TIME:  Fluoroscopy Time:  48 seconds (10 mGy). COMPLICATIONS: None immediate. PROCEDURE: Informed written consent was obtained from the patient after a thorough discussion of the procedural risks, benefits and alternatives. All questions were addressed. Maximal Sterile Barrier Technique was utilized including caps, mask, sterile gowns, sterile gloves, sterile drape, hand hygiene and skin antiseptic. A timeout was performed prior to the initiation of the procedure. Previous imaging reviewed. Preliminary ultrasound performed. The abnormal gallbladder was localized in the right upper quadrant through a lower intercostal space. Overlying skin marked. Under sterile conditions and local anesthesia, percutaneous transhepatic needle access performed of the gallbladder under direct ultrasound. Needle position  confirmed with ultrasound. There was return of bile. Guidewire inserted followed by the Accustick dilator set. Amplatz guidewire exchange performed. Tract dilatation performed to insert a 10 [place] drain. Drain catheter position confirmed with ultrasound and fluoroscopy. There was return of exudative bile. Sample sent for Gram  stain and culture. Catheter secured with a Prolene suture and connected to external gravity drainage. Sterile dressing applied. No immediate complication. IMPRESSION: Successful ultrasound and fluoroscopic 10 French transhepatic cholecystostomy. Electronically Signed   By: Jerilynn Mages.  Shick M.D.   On: 09/08/2016 12:14   Dg Chest Port 1 View  Result Date: 09/09/2016 CLINICAL DATA:  Shortness of breath, wheezing. EXAM: PORTABLE CHEST 1 VIEW COMPARISON:  Radiograph August 07, 2017. FINDINGS: Stable cardiomediastinal silhouette. No pneumothorax is noted. Stable left midlung atelectasis is noted. Stable right basilar opacity is noted concerning for atelectasis or infiltrate. Bony thorax is unremarkable. IMPRESSION: Stable left midlung and right basilar opacities are noted concerning for atelectasis or possibly infiltrate. Electronically Signed   By: Marijo Conception, M.D.   On: 09/09/2016 09:50    Anti-infectives: Anti-infectives    Start     Dose/Rate Route Frequency Ordered Stop   09/10/16 1200  cefTRIAXone (ROCEPHIN) 2 g in dextrose 5 % 50 mL IVPB     2 g 100 mL/hr over 30 Minutes Intravenous Every 24 hours 09/10/16 0901     09/08/16 2000  piperacillin-tazobactam (ZOSYN) IVPB 2.25 g  Status:  Discontinued     2.25 g 100 mL/hr over 30 Minutes Intravenous Every 6 hours 09/08/16 1310 09/10/16 0901   09/06/16 1000  azithromycin (ZITHROMAX) tablet 500 mg  Status:  Discontinued     500 mg Oral Every 24 hours 09/05/16 1422 09/08/16 0831   09/06/16 0800  cefTRIAXone (ROCEPHIN) 1 g in dextrose 5 % 50 mL IVPB  Status:  Discontinued     1 g 100 mL/hr over 30 Minutes Intravenous Every 24 hours 09/05/16 1422 09/05/16 1940   09/05/16 2000  piperacillin-tazobactam (ZOSYN) IVPB 3.375 g  Status:  Discontinued     3.375 g 12.5 mL/hr over 240 Minutes Intravenous Every 8 hours 09/05/16 1948 09/08/16 1310   09/05/16 0845  cefTRIAXone (ROCEPHIN) 1 g in dextrose 5 % 50 mL IVPB     1 g 100 mL/hr over 30 Minutes Intravenous   Once 09/05/16 0836 09/05/16 0912   09/05/16 0845  azithromycin (ZITHROMAX) 500 mg in dextrose 5 % 250 mL IVPB     500 mg 250 mL/hr over 60 Minutes Intravenous  Once 09/05/16 0836 09/05/16 1017       Assessment/Plan Cholecystitis - s/p per chole drain 1/7 - per IR needs OP follow-up in IR clinic in 5-6 weeks - 375cc/24hr yellow/orange-tinged fluid  Pneumonia RLL Atrial fibrillation Acute on chronic kidney disease stage III, h/o FSGS, has AVF in place DM HTN RA  ID - zosyn 1/4>>1/9, zithromax 1/4>>1/7, rocephin 1/9>> VTE - heparin FEN - carb modified  Plan - Patient tolerated regular diet and bowels functioning. Continue perc chole tube. When discharged patient will need follow-up with Dr. Georgette Dover in 3-4 weeks, and drain clinic appointment in 5-6 weeks.   LOS: 5 days    Jerrye Beavers , [facility] Surgery 09/10/2016, 9:50 AM Pager: [phone] Consults: [phone] Mon-Fri 7:00 am-4:30 pm Sat-Sun 7:00 am-11:30 am

## 2016-09-10 NOTE — Progress Notes (Signed)
Late Entry  Patient has been c/o increasingly worse/sharp pain in RUQ since lunchtime. He rec'd a dose of prn dilaudid at 1222 and was requesting another dose by 1430. Upon assessment pt's abdomen was noted to be [facility] more taut/firm/distended than this morning. He states his pain is much more significant than it was previously. Dr. Allyson Sabal notified and added additional orders for pain medications. [facility] PA notified of the above findings. New orders to make him npo, stat portable abd xray, and stated to attempt to flush perc drain. Drain flushed without difficulty, attempted to aspirate and was able to aspirate brown mucous type drainage without difficulty; some sediment was noted. PA notified of the above. Stated she'd come up to evaluate patient. Family at bedside and patient updated. Administered an additional dose of pain meds. Will continue to monitor.  Joellen Jersey, RN.

## 2016-09-11 ENCOUNTER — Inpatient Hospital Stay (HOSPITAL_COMMUNITY): Payer: PPO

## 2016-09-11 DIAGNOSIS — R6520 Severe sepsis without septic shock: Secondary | ICD-10-CM

## 2016-09-11 DIAGNOSIS — R140 Abdominal distension (gaseous): Secondary | ICD-10-CM

## 2016-09-11 LAB — GLUCOSE, CAPILLARY
GLUCOSE-CAPILLARY: 188 mg/dL — AB (ref 65–99)
GLUCOSE-CAPILLARY: 177 mg/dL — AB (ref 65–99)
GLUCOSE-CAPILLARY: 111 mg/dL — AB (ref 65–99)
Glucose-Capillary: 140 mg/dL — ABNORMAL HIGH (ref 65–99)
Glucose-Capillary: 162 mg/dL — ABNORMAL HIGH (ref 65–99)
Glucose-Capillary: 202 mg/dL — ABNORMAL HIGH (ref 65–99)

## 2016-09-11 LAB — POCT I-STAT 3, ART BLOOD GAS (G3+)
ACID-BASE DEFICIT: 7 mmol/L — AB (ref 0.0–2.0)
BICARBONATE: 16.5 mmol/L — AB (ref 20.0–28.0)
O2 SAT: 96 %
TCO2: 17 mmol/L (ref 0–100)
pCO2 arterial: 26.5 mmHg — ABNORMAL LOW (ref 32.0–48.0)
pH, Arterial: 7.402 (ref 7.350–7.450)
pO2, Arterial: 77 mmHg — ABNORMAL LOW (ref 83.0–108.0)

## 2016-09-11 LAB — BASIC METABOLIC PANEL
ANION GAP: 19 — AB (ref 5–15)
BUN: 102 mg/dL — ABNORMAL HIGH (ref 6–20)
CALCIUM: 8.2 mg/dL — AB (ref 8.9–10.3)
CHLORIDE: 105 mmol/L (ref 101–111)
CO2: 15 mmol/L — ABNORMAL LOW (ref 22–32)
CREATININE: 7.91 mg/dL — AB (ref 0.61–1.24)
GFR calc non Af Amer: 6 mL/min — ABNORMAL LOW (ref >60)
GFR, EST AFRICAN AMERICAN: 7 mL/min — AB (ref >60)
Glucose, Bld: 205 mg/dL — ABNORMAL HIGH (ref 65–99)
Potassium: 4.1 mmol/L (ref 3.5–5.1)
SODIUM: 139 mmol/L (ref 135–145)

## 2016-09-11 LAB — RENAL FUNCTION PANEL
ALBUMIN: 1.7 g/dL — AB (ref 3.5–5.0)
ANION GAP: 16 — AB (ref 5–15)
BUN: 108 mg/dL — ABNORMAL HIGH (ref 6–20)
CALCIUM: 7.8 mg/dL — AB (ref 8.9–10.3)
CO2: 18 mmol/L — ABNORMAL LOW (ref 22–32)
Chloride: 106 mmol/L (ref 101–111)
Creatinine, Ser: 8.03 mg/dL — ABNORMAL HIGH (ref 0.61–1.24)
GFR, EST AFRICAN AMERICAN: 7 mL/min — AB (ref >60)
GFR, EST NON AFRICAN AMERICAN: 6 mL/min — AB (ref >60)
GLUCOSE: 134 mg/dL — AB (ref 65–99)
Phosphorus: 6.4 mg/dL — ABNORMAL HIGH (ref 2.5–4.6)
Potassium: 4.4 mmol/L (ref 3.5–5.1)
SODIUM: 140 mmol/L (ref 135–145)

## 2016-09-11 LAB — CBC
HCT: 36.9 % — ABNORMAL LOW (ref 39.0–52.0)
HEMOGLOBIN: 12.7 g/dL — AB (ref 13.0–17.0)
MCH: 27 pg (ref 26.0–34.0)
MCHC: 34.4 g/dL (ref 30.0–36.0)
MCV: 78.3 fL (ref 78.0–100.0)
Platelets: 292 10*3/uL (ref 150–400)
RBC: 4.71 MIL/uL (ref 4.22–5.81)
RDW: 16 % — ABNORMAL HIGH (ref 11.5–15.5)
WBC: 19.7 10*3/uL — AB (ref 4.0–10.5)

## 2016-09-11 MED ORDER — HYDRALAZINE HCL 25 MG PO TABS
25.0000 mg | ORAL_TABLET | Freq: Three times a day (TID) | ORAL | Status: DC
  Administered 2016-09-11 – 2016-09-12 (×3): 25 mg via ORAL
  Filled 2016-09-11 (×3): qty 1

## 2016-09-11 MED ORDER — PRISMASOL BGK 4/2.5 32-4-2.5 MEQ/L IV SOLN
INTRAVENOUS | Status: DC
  Administered 2016-09-11 – 2016-09-13 (×4): via INTRAVENOUS_CENTRAL
  Filled 2016-09-11 (×5): qty 5000

## 2016-09-11 MED ORDER — DILTIAZEM HCL 25 MG/5ML IV SOLN
10.0000 mg | Freq: Once | INTRAVENOUS | Status: AC
  Administered 2016-09-11: 10 mg via INTRAVENOUS
  Filled 2016-09-11: qty 5

## 2016-09-11 MED ORDER — SODIUM CHLORIDE 0.9 % FOR CRRT
INTRAVENOUS_CENTRAL | Status: DC | PRN
  Filled 2016-09-11: qty 1000

## 2016-09-11 MED ORDER — HEPARIN (PORCINE) IN NACL 100-0.45 UNIT/ML-% IJ SOLN
1800.0000 [IU]/h | INTRAMUSCULAR | Status: DC
  Administered 2016-09-11 – 2016-09-14 (×6): 1600 [IU]/h via INTRAVENOUS
  Administered 2016-09-15: 1500 [IU]/h via INTRAVENOUS
  Administered 2016-09-17 – 2016-09-18 (×2): 1400 [IU]/h via INTRAVENOUS
  Administered 2016-09-18: 1800 [IU]/h via INTRAVENOUS
  Filled 2016-09-11 (×13): qty 250

## 2016-09-11 MED ORDER — PRISMASOL BGK 4/2.5 32-4-2.5 MEQ/L IV SOLN
INTRAVENOUS | Status: DC
  Administered 2016-09-11 – 2016-09-13 (×5): via INTRAVENOUS_CENTRAL
  Filled 2016-09-11 (×9): qty 5000

## 2016-09-11 MED ORDER — FENTANYL CITRATE (PF) 100 MCG/2ML IJ SOLN
25.0000 ug | INTRAMUSCULAR | Status: DC | PRN
  Filled 2016-09-11: qty 2

## 2016-09-11 MED ORDER — ACETYLCYSTEINE 20 % IN SOLN: 70.0000 mg/kg | RESPIRATORY_TRACT | Status: DC

## 2016-09-11 MED ORDER — ACETYLCYSTEINE 20 % IN SOLN
3.0000 mL | Freq: Four times a day (QID) | RESPIRATORY_TRACT | Status: DC
  Filled 2016-09-11: qty 4

## 2016-09-11 MED ORDER — SODIUM BICARBONATE 650 MG PO TABS
1300.0000 mg | ORAL_TABLET | Freq: Three times a day (TID) | ORAL | Status: DC
  Administered 2016-09-11 – 2016-09-24 (×40): 1300 mg via ORAL
  Filled 2016-09-11 (×40): qty 2

## 2016-09-11 MED ORDER — DILTIAZEM HCL ER COATED BEADS 120 MG PO CP24: 120.0000 mg | ORAL_CAPSULE | Freq: Once | ORAL | Status: AC

## 2016-09-11 MED ORDER — PIPERACILLIN-TAZOBACTAM IN DEX 2-0.25 GM/50ML IV SOLN
2.2500 g | Freq: Four times a day (QID) | INTRAVENOUS | Status: DC
  Administered 2016-09-11 – 2016-09-12 (×4): 2.25 g via INTRAVENOUS
  Filled 2016-09-11 (×6): qty 50

## 2016-09-11 MED ORDER — CLONIDINE HCL 0.1 MG PO TABS
0.2000 mg | ORAL_TABLET | Freq: Three times a day (TID) | ORAL | Status: DC
  Administered 2016-09-11 – 2016-09-12 (×2): 0.2 mg via ORAL
  Filled 2016-09-11 (×3): qty 2

## 2016-09-11 MED ORDER — ACETYLCYSTEINE 20 % IN SOLN
3.0000 mL | Freq: Four times a day (QID) | RESPIRATORY_TRACT | Status: DC
  Administered 2016-09-11 – 2016-09-12 (×2): 3 mL via RESPIRATORY_TRACT
  Administered 2016-09-12: 4 mL via RESPIRATORY_TRACT
  Filled 2016-09-11 (×4): qty 4

## 2016-09-11 MED ORDER — ALUM & MAG HYDROXIDE-SIMETH 200-200-20 MG/5ML PO SUSP
30.0000 mL | ORAL | Status: DC | PRN
  Administered 2016-09-11: 30 mL via ORAL
  Filled 2016-09-11: qty 30

## 2016-09-11 MED ORDER — HYDROCORTISONE NA SUCCINATE PF 100 MG IJ SOLR
50.0000 mg | Freq: Four times a day (QID) | INTRAMUSCULAR | Status: DC
  Administered 2016-09-11 – 2016-09-15 (×16): 50 mg via INTRAVENOUS
  Filled 2016-09-11 (×17): qty 2

## 2016-09-11 MED ORDER — HEPARIN SODIUM (PORCINE) 1000 UNIT/ML DIALYSIS
1000.0000 [IU] | INTRAMUSCULAR | Status: DC | PRN
  Administered 2016-09-13: 2400 [IU] via INTRAVENOUS_CENTRAL
  Filled 2016-09-11 (×2): qty 6
  Filled 2016-09-11: qty 3

## 2016-09-11 MED ORDER — DILTIAZEM HCL 100 MG IV SOLR
5.0000 mg/h | INTRAVENOUS | Status: DC
  Administered 2016-09-11: 10 mg/h via INTRAVENOUS
  Administered 2016-09-11: 5 mg/h via INTRAVENOUS
  Administered 2016-09-12: 10 mg/h via INTRAVENOUS
  Filled 2016-09-11 (×4): qty 100

## 2016-09-11 MED ORDER — PRISMASOL BGK 4/2.5 32-4-2.5 MEQ/L IV SOLN
INTRAVENOUS | Status: DC
  Administered 2016-09-11 – 2016-09-13 (×12): via INTRAVENOUS_CENTRAL
  Filled 2016-09-11 (×21): qty 5000

## 2016-09-11 MED ORDER — LEVALBUTEROL HCL 0.63 MG/3ML IN NEBU
0.6300 mg | INHALATION_SOLUTION | Freq: Three times a day (TID) | RESPIRATORY_TRACT | Status: DC
  Administered 2016-09-11 – 2016-09-12 (×5): 0.63 mg via RESPIRATORY_TRACT
  Filled 2016-09-11 (×6): qty 3

## 2016-09-11 MED ORDER — IOPAMIDOL (ISOVUE-300) INJECTION 61%
INTRAVENOUS | Status: AC
  Administered 2016-09-11: 30 mL
  Filled 2016-09-11: qty 30

## 2016-09-11 MED ORDER — PREDNISONE 20 MG PO TABS: 40.0000 mg | ORAL_TABLET | Freq: Every day | ORAL | Status: DC

## 2016-09-11 MED ORDER — SACCHAROMYCES BOULARDII 250 MG PO CAPS
250.0000 mg | ORAL_CAPSULE | Freq: Two times a day (BID) | ORAL | Status: DC
  Administered 2016-09-11 – 2016-09-29 (×37): 250 mg via ORAL
  Filled 2016-09-11 (×38): qty 1

## 2016-09-11 MED ORDER — DILTIAZEM HCL ER COATED BEADS 240 MG PO CP24
240.0000 mg | ORAL_CAPSULE | Freq: Every day | ORAL | Status: DC
  Filled 2016-09-11: qty 1

## 2016-09-11 MED ORDER — HEPARIN BOLUS VIA INFUSION
2500.0000 [IU] | Freq: Once | INTRAVENOUS | Status: AC
  Administered 2016-09-11: 2500 [IU] via INTRAVENOUS
  Filled 2016-09-11: qty 2500

## 2016-09-11 MED ORDER — DILTIAZEM HCL 30 MG PO TABS
30.0000 mg | ORAL_TABLET | Freq: Four times a day (QID) | ORAL | Status: DC
  Administered 2016-09-11: 30 mg via ORAL
  Filled 2016-09-11: qty 1

## 2016-09-11 NOTE — Procedures (Signed)
Hemodialysis Catheter Insertion Procedure Note Gary Lang [id number] Feb 25, 1951  Procedure: Insertion of Hemodialysis Catheter Indications: Hemodialysis  Procedure Details Consent: Risks of procedure as well as the alternatives and risks of each were explained to the (patient/caregiver).  Consent for procedure obtained.  Time Out: Verified patient identification, verified procedure, site/side was marked, verified correct patient position, special equipment/implants available, medications/allergies/relevent history reviewed, required imaging and test results available.  Performed  Maximum sterile technique was used including antiseptics, cap, gloves, gown, hand hygiene, mask and sheet.  Skin prep: Chlorhexidine; local anesthetic administered  A Trialysis HD catheter was placed in the right internal jugular vein using the Seldinger technique.  Evaluation Blood flow good Complications: No apparent complications Patient did not tolerate procedure well. Chest X-ray ordered to verify placement.  CXR: pending.    [name], AG-ACNP [facility]  Pgr: [phone]  PCCM Pgr: [phone]

## 2016-09-11 NOTE — Consult Note (Signed)
PULMONARY / CRITICAL CARE MEDICINE   Name: Gary Lang MRN: [id number] DOB: [birth year]    ADMISSION DATE:  09/05/2016 CONSULTATION DATE:  09/11/16  REFERRING MD:  Allyson Sabal  CHIEF COMPLAINT:  AKI  HISTORY OF PRESENT ILLNESS:  Gary Lang is a 66 y.o. 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  He was admitted 09/05/16 with sepsis and was later found to have E.Coli bacteremia due to acute cholecystitis.  He was seen by [facility] and had percutaneous drain placed 01/09 as CCS felt he was not stable enough for full surgery. Since admission, he has had worsening in renal function and also developed new onset paroxysmal atrial fib.  Due to worsening renal function, nephrology recommended transfer to ICU and initiation of CRRT (prefer CRRT vs HD due to issues with heart rate).   PAST MEDICAL HISTORY :  He  has a past medical history of Chronic kidney disease; Diabetes mellitus without complication ([place]); Hypertension; Rheumatoid arthritis ([place]); and Vitamin D deficiency.  PAST SURGICAL HISTORY: He  has a past surgical history that includes Hernia repair; neck surgery; AV fistula placement (Left, 06/28/2013); Insertion of dialysis catheter (N/A, 06/28/2013); Colonoscopy; Ligation of competing branches of arteriovenous fistula (Left, 10/12/2013); and ir generic historical (09/08/2016).  Allergies  Allergen Reactions  . Glyburide Other (See Comments)    unknown  . Methotrexate Derivatives Other (See Comments)    Patient cannot remember    No current facility-administered medications on file prior to encounter.    Current Outpatient Prescriptions on File Prior to Encounter  Medication Sig  . acetaminophen (TYLENOL) 500 MG tablet Take 2 tablets (1,000 mg total) by mouth 3 (three) times daily as needed for pain.  Marland Kitchen aspirin 81 MG EC tablet Take 1 tablet (81 mg total) by mouth daily. Swallow whole.  Marland Kitchen atorvastatin (LIPITOR) 40 MG tablet Take 1  tablet by mouth daily.  . cholecalciferol (VITAMIN D) 1000 units tablet Take 1,000 Units by mouth daily.  . cloNIDine (CATAPRES) 0.2 MG tablet Take 0.2 mg by mouth daily.  Marland Kitchen dicyclomine (BENTYL) 20 MG tablet Take 1 tablet (20 mg total) by mouth 2 (two) times daily as needed for spasms.  Marland Kitchen diltiazem (DILACOR XR) 180 MG 24 hr capsule Take 1 capsule (180 mg total) by mouth daily.  . furosemide (LASIX) 80 MG tablet Take 80 mg by mouth daily.   . insulin aspart (NOVOLOG FLEXPEN) 100 UNIT/ML FlexPen Inject 7.5 Units into the skin 3 (three) times daily with meals. (Patient taking differently: Inject 18 Units into the skin 3 (three) times daily with meals. )  . Insulin Glargine (LANTUS) 100 UNIT/ML Solostar Pen Inject 20 Units into the skin every morning. Increase 5 u if am CBG is greater than 200.  Decrease 5 u if am CBG is less than 150 (Patient taking differently: Inject 40 Units into the skin every morning. Increase 5 u if am CBG is greater than 200.  Decrease 5 u if am CBG is less than 150)  . loperamide (IMODIUM) 2 MG capsule Take 1 capsule (2 mg total) by mouth 4 (four) times daily as needed for diarrhea or loose stools.  . ondansetron (ZOFRAN ODT) 4 MG disintegrating tablet Take 1 tablet (4 mg total) by mouth every 8 (eight) hours as needed for nausea or vomiting.  . sildenafil (VIAGRA) 50 MG tablet Take 0.5-1 tablets (25-50 mg total) by mouth daily as needed for erectile dysfunction.  . TRULICITY 1.5  MG/0.5ML SOPN Inject 1 Units as directed once a week. Patient isn't sure how many units he takes. Patient takes on Wednesday's.  . Insulin Pen Needle (PEN NEEDLES) 31G X 6 MM MISC Use 1 needle per injection as directed  . UNIFINE PENTIPS 31G X 5 MM MISC USE AS DIRECTED WITH INSULIN    FAMILY HISTORY:  His indicated that his mother is deceased. He indicated that his father is deceased. He indicated that his sister is alive.    SOCIAL HISTORY: He  reports that he quit smoking about 40 years ago. His  smoking use included Cigarettes. He has a 5.00 pack-year smoking history. He has never used smokeless tobacco. He reports that he does not drink alcohol or use drugs.  REVIEW OF SYSTEMS:   All negative; except for those that are bolded, which indicate positives.  Constitutional: weight loss, weight gain, night sweats, fevers, chills, fatigue, weakness.  HEENT: headaches, sore throat, sneezing, nasal congestion, post nasal drip, difficulty swallowing, tooth/dental problems, visual complaints, visual changes, ear aches. Neuro: difficulty with speech, weakness, numbness, ataxia. CV:  chest pain, orthopnea, PND, swelling in lower extremities, dizziness, palpitations, syncope.  Resp: cough, hemoptysis, dyspnea, wheezing. GI: heartburn, indigestion, abdominal pain, nausea, vomiting, diarrhea, constipation, change in bowel habits, loss of appetite, hematemesis, melena, hematochezia.  GU: dysuria, change in color of urine, urgency or frequency, flank pain, hematuria. MSK: joint pain or swelling, decreased range of motion. Psych: change in mood or affect, depression, anxiety, suicidal ideations, homicidal ideations. Skin: rash, itching, bruising.   SUBJECTIVE:  No complaints, denies SOB, chest pain.  VITAL SIGNS: BP (!) 165/95 (BP Location: Right Leg)   Pulse (!) 127   Temp 98.5 F (36.9 C) (Oral)   Resp (!) 27   Ht '6\' 1"'$  (1.854 m)   Wt 245 lb 9.5 oz (111.4 kg)   SpO2 94%   BMI 32.40 kg/m   HEMODYNAMICS:    VENTILATOR SETTINGS:    INTAKE / OUTPUT: I/O last 3 completed shifts: In: 1443.6 [P.O.:960; I.V.:268.6; Other:15; IV Piggyback:200] Out: 9163 [Urine:3500; Drains:425]   PHYSICAL EXAMINATION: General: Middle aged male, in NAD. Neuro: A&O x 3, non-focal.  HEENT: [place]/AT. PERRL, sclerae anicteric. Cardiovascular: RRR, no M/R/G.  Lungs: Respirations even and unlabored.  Coarse bilateral bases. Abdomen: BS x 4, soft, NT/ND.  Musculoskeletal: No gross deformities, 1+ edema.   Skin: Intact, warm, no rashes.  LABS:  BMET  Recent Labs Lab 09/09/16 2130 09/10/16 0356 09/11/16 0359  NA 135 137 139  K 4.2 4.5 4.1  CL 105 106 105  CO2 16* 18* 15*  BUN 80* 81* 102*  CREATININE 7.05* 7.25* 7.91*  GLUCOSE 410* 299* 205*    Electrolytes  Recent Labs Lab 09/06/16 0621  09/07/16 1208  09/09/16 0938 09/09/16 2130 09/10/16 0356 09/11/16 0359  CALCIUM 7.8*  < >  --   < >  --  8.0* 8.0* 8.2*  MG 1.7  --  2.1  --  2.3  --   --   --   < > = values in this interval not displayed.  CBC  Recent Labs Lab 09/08/16 1017 09/09/16 0340 09/11/16 0359  WBC 11.3* 11.4* 19.7*  HGB 10.4* 10.6* 12.7*  HCT 31.8* 32.2* 36.9*  PLT 162 180 292    Coag's  Recent Labs Lab 09/08/16 1017  INR 1.11    Sepsis Markers  Recent Labs Lab 09/05/16 0738 09/05/16 1010  LATICACIDVEN 2.80* 0.99    ABG No results for input(s): PHART,  PCO2ART, PO2ART in the last 168 hours.  Liver Enzymes  Recent Labs Lab 09/07/16 1638 09/09/16 0340 09/10/16 0356  AST 64* 57* 30  ALT 45 47 39  ALKPHOS 107 128* 111  BILITOT 2.2* 1.2 1.1  ALBUMIN 2.1* 1.7* 1.6*    Cardiac Enzymes  Recent Labs Lab 09/09/16 0938 09/09/16 1547 09/09/16 2130  TROPONINI 0.04* 0.15* 0.04*    Glucose  Recent Labs Lab 09/10/16 1814 09/10/16 2005 09/10/16 2316 09/11/16 0327 09/11/16 0736 09/11/16 1304  GLUCAP 331* 323* 244* 188* 177* 140*    Imaging Ct Abdomen Pelvis Wo Contrast  Result Date: 09/11/2016 CLINICAL DATA:  Left lower quadrant abdominal pain. EXAM: CT ABDOMEN AND PELVIS WITHOUT CONTRAST TECHNIQUE: Multidetector CT imaging of the abdomen and pelvis was performed following the standard protocol without IV contrast. COMPARISON:  09/07/2016 FINDINGS: Lower chest: Moderate right pleural effusion, increased since prior study. Consolidation in the right lower lobe. Left base atelectasis. Heart is normal size. Hepatobiliary: Interval placement of a cholecystostomy drainage  catheter into the gallbladder with decompression of the gallbladder. The subcapsular fluid collection along the inferior right hepatic surface has decreased in size since prior study. Pancreas: No focal abnormality or ductal dilatation. Spleen: No focal abnormality.  Normal size. Adrenals/Urinary Tract: Perinephric stranding around the kidneys bilaterally, stable. Left renal cyst is also stable. No hydronephrosis. Adrenal glands are unremarkable. Foley catheter is present in the bladder which is decompressed. Stomach/Bowel: Colonic diverticulosis. No active diverticulitis. Stomach and small bowel are decompressed, unremarkable. Vascular/Lymphatic: No evidence of aneurysm or adenopathy. Reproductive: No visible focal abnormality. Other: Small amount of free fluid in the pelvis and adjacent to the liver. No free air. Large left inguinal hernia containing fat, stable. Musculoskeletal: No acute bony abnormality or focal bone lesion. IMPRESSION: Interval placement of cholecystostomy drainage catheter into the gallbladder with decompression. The subcapsular fluid collection along the right hepatic margin has decreased in size as well. Small amount of free fluid in the pelvis and adjacent to the liver. Increasing moderate right pleural effusion with right low consolidation. Cannot exclude pneumonia. Left base atelectasis. Left inguinal hernia containing fat. Electronically Signed   By: Rolm Baptise M.D.   On: 09/11/2016 12:20   Dg Chest Port 1 View  Result Date: 09/11/2016 CLINICAL DATA:  Onset epigastric pain today.  Wheezing. EXAM: PORTABLE CHEST 1 VIEW COMPARISON:  09/09/2016 FINDINGS: Diffuse bilateral airspace disease, right greater than left, most confluent in the right lower lung. While this could reflect edema, cannot exclude pneumonia. Heart is borderline in size. Probable right effusion. IMPRESSION: Bilateral airspace disease, right greater than left, most confluent in the right base. Edema versus infection.  Electronically Signed   By: Rolm Baptise M.D.   On: 09/11/2016 11:13   Dg Abd Portable 1v  Result Date: 09/10/2016 CLINICAL DATA:  Abdominal pain.  Cholecystostomy. EXAM: PORTABLE ABDOMEN - 1 VIEW COMPARISON:  CT 09/07/2016 FINDINGS: Pigtail catheter right upper quadrant in satisfactory position most likely within the gallbladder. Normal bowel gas pattern. No obstruction or ileus. Small amount of contrast or ingested tablets in the stomach. IMPRESSION: Cholecystostomy tube in good position.  Normal bowel gas pattern. Electronically Signed   By: Franchot Gallo M.D.   On: 09/10/2016 15:38     STUDIES:  Echo 01/08 > EF 65-70%, G1DD, PAP 40.  CULTURES: Blood 01/04 > E.Coli. Gallbladder fluid 01/07 > E.Coli, K.Pneumoniae.  ANTIBIOTICS: Azithro 01/04 > 01/10 Ceftriaxone 01/04 > 01/05 Zosyn 01/04 >  SIGNIFICANT EVENTS: 01/04 > admit.  LINES/TUBES:  HD cath pending >  DISCUSSION: 66 y.o. male admitted 01/04 with sepsis, found to have E.coli bacteremia from acute cholecystitis.  Has had worsening renal function since admit (baseline CKD III due to FSGS); therefore, transferred to ICU 01/10 for initiation of CRRT.  ASSESSMENT / PLAN:  RENAL A:   Acute on CKD III due to FSGS - now with worsening renal failure since admission. AGMA - renal failure. P:   Nephrology following, to start CRRT this afternoon. BMP in AM.  CARDIOVASCULAR A:  New onset PAF - back to NSR AM 01/10 then developed AFRVR that afternoon which did not respond to dilt + metop. Hx HTN. P:  Start cardizem gtt. Start heparin gtt (after HD cath is placed). Cardiology following, appreciate the assistance. Transition to coumadin. Hydralazine PRN, metoprolol PRN. Continue ASA, clonidine.  INFECTIOUS A:   E.Coli bacteremia - likely due to acute cholecystitis. P:   Continue abx as above (zosyn).   PULMONARY A: Acute hypoxic respiratory failure. Concern for CAP. Atelectasis. P:   Continue supplemental O2 as  needed to maintain SpO2 > 92%. Continue abx as per ID section. Incentive spirometry. Mobilize as able.  GASTROINTESTINAL A:   Nutrition. P:   Carb mod diet.  HEMATOLOGIC A:   VTE Prophylaxis. P:  SCD's / heparin gtt. CBC in AM.  ENDOCRINE A:   DM.  P:   SSI, lantus.  NEUROLOGIC A:   No acute issues. P:   No interventions required.  AUTOIMMUNE A: Hx RA - on pred. P: Continue prednisone.  Family updated: None available.  Interdisciplinary Family Meeting v Palliative Care Meeting:  Due by: 09/18/16.  CC time: 35 minutes.   Montey Hora, [place] [facility] Pager: [phone]  or [phone] 09/11/2016, 2:36 PM  STAFF NOTE: I, Merrie Roof, MD FACP have personally reviewed patient's available data, including medical history, events of note, physical examination and test results as part of my evaluation. I have discussed with resident/NP and other care providers such as pharmacist, RN and RRT. In addition, I personally evaluated patient and elicited key findings of: lethargic, .ronchi and reduced BS, pcxr with rt base atx, recent drain in place with brown output, now with fib rvr in setting sirs / sepsis and worsening arf on top FSGS, he now has HD cath for cvvhd, I am concerned about his resp status, get abg, may need ett for nimv support, add chest pt, mucomyst's, cvvhd per renal, we are on zosyn, likely can transtion to ceftriaxone in am pending clinical status and risk polymicrobial rt base asp?, npo, cardizem drip for now, may need additional amio The patient is critically ill with multiple organ systems failure and requires high complexity decision making for assessment and support, frequent evaluation and titration of therapies, application of advanced monitoring technologies and extensive interpretation of multiple databases.   Critical Care Time devoted to patient care services described in this note is 30 Minutes.  This time reflects time of care of this signee: Merrie Roof, MD FACP. This critical care time does not reflect procedure time, or teaching time or supervisory time of PA/NP/Med student/Med Resident etc but could involve care discussion time. Rest per NP/medical resident whose note is outlined above and that I agree with   [name]. Titus Mould, MD, [place] Pgr: [facility] 09/11/2016 4:12 PM

## 2016-09-11 NOTE — Progress Notes (Signed)
Subjective: Patient had been doing well, but later in the day yesterday began having more right chest/ RUQ abdominal pain Drain seems to be functioning with clear bilious drainage LFT's yesterday were improving WBC up to 19.7 Requiring more oxygen - increased shortness of breath  Objective: Vital signs in last 24 hours: Temp:  [97.5 F (36.4 C)-97.8 F (36.6 C)] 97.8 F (36.6 C) (01/10 0734) Pulse Rate:  [82-146] 109 (01/10 0734) Resp:  [17-30] 30 (01/10 0734) BP: (155-203)/(65-103) 186/79 (01/10 0734) SpO2:  [90 %-98 %] 96 % (01/10 0757) Last BM Date: 09/09/16  Intake/Output from previous day: 01/09 0701 - 01/10 0700 In: 1160 [P.O.:960; I.V.:85; IV Piggyback:100] Out: 2475 [Urine:2300; Drains:175] Intake/Output this shift: Total I/O In: -  Out: 500 [Urine:500]  General appearance: alert, cooperative and no distress Resp: occasional wheezes right lung GI: moderate Right lower chest/ RUQ tenderness  Lab Results:   Recent Labs  09/09/16 0340 09/11/16 0359  WBC 11.4* 19.7*  HGB 10.6* 12.7*  HCT 32.2* 36.9*  PLT 180 292   BMET  Recent Labs  09/10/16 0356 09/11/16 0359  NA 137 139  K 4.5 4.1  CL 106 105  CO2 18* 15*  GLUCOSE 299* 205*  BUN 81* 102*  CREATININE 7.25* 7.91*  CALCIUM 8.0* 8.2*   Hepatic Function Latest Ref Rng & Units 09/10/2016 09/09/2016 09/07/2016  Total Protein 6.5 - 8.1 g/dL 6.2(L) 6.4(L) 6.3(L)  Albumin 3.5 - 5.0 g/dL 1.6(L) 1.7(L) 2.1(L)  AST 15 - 41 U/L 30 57(H) 64(H)  ALT 17 - 63 U/L 39 47 45  Alk Phosphatase 38 - 126 U/L 111 128(H) 107  Total Bilirubin 0.3 - 1.2 mg/dL 1.1 1.2 2.2(H)    PT/INR  Recent Labs  09/08/16 1017  LABPROT 14.4  INR 1.11   ABG No results for input(s): PHART, HCO3 in the last 72 hours.  Invalid input(s): PCO2, PO2  Studies/Results: Dg Chest Port 1 View  Result Date: 09/09/2016 CLINICAL DATA:  Shortness of breath, wheezing. EXAM: PORTABLE CHEST 1 VIEW COMPARISON:  Radiograph August 07, 2017.  FINDINGS: Stable cardiomediastinal silhouette. No pneumothorax is noted. Stable left midlung atelectasis is noted. Stable right basilar opacity is noted concerning for atelectasis or infiltrate. Bony thorax is unremarkable. IMPRESSION: Stable left midlung and right basilar opacities are noted concerning for atelectasis or possibly infiltrate. Electronically Signed   By: Marijo Conception, M.D.   On: 09/09/2016 09:50   Dg Abd Portable 1v  Result Date: 09/10/2016 CLINICAL DATA:  Abdominal pain.  Cholecystostomy. EXAM: PORTABLE ABDOMEN - 1 VIEW COMPARISON:  CT 09/07/2016 FINDINGS: Pigtail catheter right upper quadrant in satisfactory position most likely within the gallbladder. Normal bowel gas pattern. No obstruction or ileus. Small amount of contrast or ingested tablets in the stomach. IMPRESSION: Cholecystostomy tube in good position.  Normal bowel gas pattern. Electronically Signed   By: Franchot Gallo M.D.   On: 09/10/2016 15:38    Anti-infectives: Anti-infectives    Start     Dose/Rate Route Frequency Ordered Stop   09/10/16 1200  cefTRIAXone (ROCEPHIN) 2 g in dextrose 5 % 50 mL IVPB     2 g 100 mL/hr over 30 Minutes Intravenous Every 24 hours 09/10/16 0901     09/08/16 2000  piperacillin-tazobactam (ZOSYN) IVPB 2.25 g  Status:  Discontinued     2.25 g 100 mL/hr over 30 Minutes Intravenous Every 6 hours 09/08/16 1310 09/10/16 0901   09/06/16 1000  azithromycin (ZITHROMAX) tablet 500 mg  Status:  Discontinued  500 mg Oral Every 24 hours 09/05/16 1422 09/08/16 0831   09/06/16 0800  cefTRIAXone (ROCEPHIN) 1 g in dextrose 5 % 50 mL IVPB  Status:  Discontinued     1 g 100 mL/hr over 30 Minutes Intravenous Every 24 hours 09/05/16 1422 09/05/16 1940   09/05/16 2000  piperacillin-tazobactam (ZOSYN) IVPB 3.375 g  Status:  Discontinued     3.375 g 12.5 mL/hr over 240 Minutes Intravenous Every 8 hours 09/05/16 1948 09/08/16 1310   09/05/16 0845  cefTRIAXone (ROCEPHIN) 1 g in dextrose 5 % 50 mL IVPB      1 g 100 mL/hr over 30 Minutes Intravenous  Once 09/05/16 0836 09/05/16 0912   09/05/16 0845  azithromycin (ZITHROMAX) 500 mg in dextrose 5 % 250 mL IVPB     500 mg 250 mL/hr over 60 Minutes Intravenous  Once 09/05/16 0836 09/05/16 1017      Assessment/Plan: Cholecystitis - s/p per chole drain 1/7 - per IR needs OP follow-up in IR clinic in 5-6 weeks - 175cc/24hr bilious fluid  Pneumonia RLL Atrial fibrillation Acute on chronic kidney disease stage III, h/o FSGS, has AVF in place DM HTN RA  ID - zosyn 1/4>>1/9, zithromax 1/4>>1/7, rocephin 1/9>> VTE - heparin FEN - carb modified  Plan - It appears that the perc chole tube is functioning well - normalizing LFT's, clear bilious drainage.  Would look for other sources of WBC.  May require repeat CT scan if abdominal pain continues  LOS: 6 days    Laneshia Pina K. 09/11/2016

## 2016-09-11 NOTE — Progress Notes (Signed)
Pt HR in the 140-150's. PRN 2.'5mg'$  lopressor IV given. HR remained in the 140-150's. EKG done showed Afib with RVR. MD paged. Ordered '10mg'$   Cardizem IV. Will continue to monitor.

## 2016-09-11 NOTE — Progress Notes (Signed)
PT Cancellation Note  Patient Details Name: Gary Lang MRN: [id number] DOB: [birth year]   Cancelled Treatment:    Reason Eval/Treat Not Completed: Medical issues which prohibited therapy. HR in 150s, nursing declining PT services at this time. Will continue to follow as appropriate.    Cassell Clement, PT, CSCS Pager [phone] Office [phone]  09/11/2016, 12:59 PM

## 2016-09-11 NOTE — Progress Notes (Signed)
Patient ID: Gary Lang, male   DOB: 25-Dec-1950, 66 y.o.   MRN: [id number] Patient reevaluated after review of his chest x-ray findings earlier and after discussion with Dr.Abrol. Plans for transfer to a unit where he can undergo CRRT as I suspect that his atrial flutter/tachycardia will limit safe delivery of hemodialysis. The goal of CRRT will be volume unloading along with clearance. I have discussed with the patient and he is agreeable to proceed this route.  Elmarie Shiley MD [facility] Office # [phone] Pager # [phone] 1:02 PM

## 2016-09-11 NOTE — Progress Notes (Signed)
Pt has been in and out of atrial fibrillation with RVR. Attempts to control with oral diltiazem 120 mg long acting and additional short acting 30 mg plus 10 mg IV bolus and metorpolol 5 mg IV have not been successful at maintaining SR or controlling rate. Will restart Cardizem drip and watch BP closely as BP may decrease with CRRT.  Daune Perch, AGNP-C 1/10/20182:14 PM Pager: [phone]

## 2016-09-11 NOTE — Progress Notes (Signed)
PROGRESS NOTE    Gary Lang  NGE:[id number] DOB: 10-01-1950 DOA: 09/05/2016 PCP: [name], MD   Brief Narrative: 66 y.o. male with medical history significant of chronic kidney disease, diabetes, hypertension, rheumatoid arthritis, ex-smoker quit about 20 years ago presented with fever up to 102 at home associated with chills and right-sided chest pain.Patient with RUQ pain and nausea. CT imaging consistent with acute cholecystitis.  s/p per chole drain 1/7. Now with worsening renal function, recurrent A. fib, acute hypoxic respiratory failure   Assessment & Plan:   Active Problems:   Community acquired pneumonia of right lower lobe of lung ([place])   Acute respiratory failure with hypoxia ([place])   Acute cholecystitis due to biliary calculus   AKI (acute kidney injury) ([place])    #Sepsis due to E Coli bacteremia and possibly commonly acquired pneumonia/acute cholecystitis: Chest x-ray showed right lower lobe pneumonia. Patient with tachycardia, leukocytosis, fever and abnormal x-ray finding on admission -2 out of 2 blood cultures growing gram-negative rods consistent with Escherichia coli. Follow up final culture and sensitivity. Patient's white blood cell count has increased, will transition back to Zosyn and discontinue Rocephin. Also start patient on probiotics Recent CT scan of abdomen and pelvis with no acute finding.  source of Escherichia coli bacteremia likely gallbladder. Abdomen more distended, last 2 days -Respiratory viral studies negative. Will repeat CT abdomen pelvis today to rule out developing pelvic abscess Patient also on steroids for respiratory failure, leukocytosis could be related to Steroids?  Patient is being followed by Donnie Mesa, MD who recommended percutaneous cholecystostomy to decompress the gallbladder.patient is now  S/p perc cholecystostomy 09/08/16.per IR needsOP follow-up in IR clinic in 5-6 weeks After he has fully recovered from his pneumonia,   we  can consider elective laparoscopic cholecystectomy  Hold off on full dose anticoagulation with Coumadin, pending decision by surgery to remove gallbladder    #Acute hypoxic respiratory failure likely in the setting of pneumonia and sepsis:  Currently requiring 3-4 L of oxygen.   Continue treatment for pneumonia with Zosyn. Continue to monitor.  CT chest shows multifocal atelectasis, possible superimposed pneumonia. 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 Acute renal insufficiency appears to be associated with sepsis/ATN. Overnight, excellent response to furosemide with some increase in his creatinine-trajectory of this rise appears to be slowing down indicating an approach of the plateau phase of ATN -Serum creatinine level worsened today. Presented with a creatinine of 1.25. Creatinine now 7.91 Urine output  2300  mL over 24 hours,   Continue strict I's and O's   Avoid nephrotoxins. -Patient required hemodialysis in the past and he has left upper extremity AV fistula. Nephrology Dr. Posey Pronto consulted, transferred to ICU, and begin CRRT. Discussed with Dr. Chase Caller   #Elevated troponin likely in the setting of sepsis: Trend cardiac enzymes in the setting of new onset atrial fibrillation -Echocardiogram showed vigorous left ventricular systolic function with EF of 65-30%. There were no regional wall motion abnormalities. Also with  grade 1 diastolic dysfunction. Continues to have sinus tachycardia. Received IV Cardizem for transient A. fib last night. Continue diltiazem, increase dose to 240 mg Appreciate cardiology input   #Hyperkalemia: Resolved after receiving 1 dose of Kayexalate   New-onset A fib? Back in NSR spontaneously Trend troponins, cardiology consulted, determine timing of long-term anticoagulation Was on by mouth Cardizem prior to  admission Was  placed on Cardizem drip , off for 2 days, went back into atrial fibrillation 1/9 for which she was again given IV Cardizem Increase by mouth Cardizem today No plans for inpatient ischemic w/u at this time Cardiology  Recommends initiation of Coumadin, per pharmacy, once cleared from a surgical standpoint. INR goal of 2-3.  Uncontrolled diabetes Was placed on insulin drip yesterday primarily for stabilizing glucose, did not suspect dka , bicarbonate is low secondary to acute kidney injury Continue to decrease steroids Now transition to Lantus, sliding scale insulin   DVT prophylaxis: Heparin subcutaneous Code Status: Full code Family Communication: No family present at bedside Disposition Plan: Transfer to ICU    Consultants:   Nephrology  Cardiology    Procedures: None Zosyn  1/9-Rocephin  Subjective:  Patient was in atrial fibrillation last night for which she was given IV Cardizem   Objective: Vitals:   09/11/16 0600 09/11/16 0734 09/11/16 0757 09/11/16 0900  BP: (!) 155/94 (!) 186/79    Pulse: (!) 137 (!) 109  (!) 110  Resp: (!) 23 (!) 30  (!) 31  Temp:  97.8 F (36.6 C)    TempSrc:  Oral    SpO2:  95% 96% 95%  Weight:      Height:        Intake/Output Summary (Last 24 hours) at 09/11/16 0932 Last data filed at 09/11/16 0900  Gross per 24 hour  Intake              730 ml  Output             2525 ml  Net            -1795 ml   Filed Weights   09/05/16 0729 09/05/16 1442 09/07/16 1459  Weight: 102.1 kg (225 lb) 102.1 kg (225 lb) 111.4 kg (245 lb 9.5 oz)    Examination:  General exam: Ill-looking male, lying in bed, not in distress Respiratory system: Clear bilaterally except mild intermittent wheeze, speaking full sentences. Cardiovascular system: S1 and S2 normal. Regular rate rhythm. No pedal edema. Gastrointestinal system: Abdomen soft, nontender, nondistended. Bowel sound positive.  Central nervous system: Alert and oriented. No  focal neurological deficits. Extremities: Symmetric 5 x 5 power. Skin: No rashes, lesions or ulcers Psychiatry: Judgement and insight appear normal. Mood & affect appropriate.     Data Reviewed: I have personally reviewed following labs and imaging studies  CBC:  Recent Labs Lab 09/05/16 0721 09/06/16 0621 09/07/16 0719 09/08/16 1017 09/09/16 0340 09/11/16 0359  WBC 12.5* 10.8* 11.4* 11.3* 11.4* 19.7*  NEUTROABS 11.4*  --   --   --   --   --   HGB 13.5 11.8* 11.4* 10.4* 10.6* 12.7*  HCT 40.8 36.6* 35.3* 31.8* 32.2* 36.9*  MCV 84.1 84.7 83.8 81.1 81.7 78.3  PLT 137* 127* 130* 162 180 295   Basic Metabolic Panel:  Recent Labs Lab 09/06/16 0621  09/07/16 1208  09/08/16 1017 09/09/16 0340 09/09/16 0938 09/09/16 2130 09/10/16 0356 09/11/16 0359  NA 139  < >  --   < > 140 134*  --  135 137 139  K 4.5  < >  --   < > 4.1 5.0  --  4.2 4.5 4.1  CL 109  < >  --   < > 110 106  --  105 106 105  CO2 21*  < >  --   < > 20* 17*  --  16* 18* 15*  GLUCOSE  245*  < >  --   < > 102* 376*  --  410* 299* 205*  BUN 29*  < >  --   < > 51* 65*  --  80* 81* 102*  CREATININE 2.67*  < >  --   < > 5.95* 6.67*  --  7.05* 7.25* 7.91*  CALCIUM 7.8*  < >  --   < > 8.3* 8.0*  --  8.0* 8.0* 8.2*  MG 1.7  --  2.1  --   --   --  2.3  --   --   --   < > = values in this interval not displayed. GFR: Estimated Creatinine Clearance: 12 mL/min (by C-G formula based on SCr of 7.91 mg/dL (H)). Liver Function Tests:  Recent Labs Lab 09/05/16 0721 09/07/16 1638 09/09/16 0340 09/10/16 0356  AST 23 64* 57* 30  ALT 21 45 47 39  ALKPHOS 86 107 128* 111  BILITOT 2.4* 2.2* 1.2 1.1  PROT 7.4 6.3* 6.4* 6.2*  ALBUMIN 3.0* 2.1* 1.7* 1.6*    Recent Labs Lab 09/05/16 0721 09/07/16 1638 09/08/16 1017  LIPASE 22 77* 71*   No results for input(s): AMMONIA in the last 168 hours. Coagulation Profile:  Recent Labs Lab 09/08/16 1017  INR 1.11   Cardiac Enzymes:  Recent Labs Lab 09/05/16 1517  09/05/16 2023 09/09/16 0938 09/09/16 1547 09/09/16 2130  TROPONINI 0.05* 0.05* 0.04* 0.15* 0.04*   BNP (last 3 results) No results for input(s): PROBNP in the last 8760 hours. HbA1C: No results for input(s): HGBA1C in the last 72 hours. CBG:  Recent Labs Lab 09/10/16 1814 09/10/16 2005 09/10/16 2316 09/11/16 0327 09/11/16 0736  GLUCAP 331* 323* 244* 188* 177*   Lipid Profile: No results for input(s): CHOL, HDL, LDLCALC, TRIG, CHOLHDL, LDLDIRECT in the last 72 hours. Thyroid Function Tests:  Recent Labs  09/09/16 0938  TSH 0.457  FREET4 0.76   Anemia Panel: No results for input(s): VITAMINB12, FOLATE, FERRITIN, TIBC, IRON, RETICCTPCT in the last 72 hours. Sepsis Labs:  Recent Labs Lab 09/05/16 0738 09/05/16 1010  LATICACIDVEN 2.80* 0.99    Recent Results (from the past 240 hour(s))  Urine culture     Status: Abnormal   Collection Time: 09/03/16 12:36 AM  Result Value Ref Range Status   Specimen Description URINE, CLEAN CATCH  Final   Special Requests NONE  Final   Culture (A)  Final    <10,000 COLONIES/mL INSIGNIFICANT GROWTH Performed at [facility]    Report Status 09/04/2016 FINAL  Final  Blood Culture (routine x 2)     Status: Abnormal   Collection Time: 09/05/16  7:21 AM  Result Value Ref Range Status   Specimen Description BLOOD RIGHT FOREARM  Final   Special Requests BOTTLES DRAWN AEROBIC AND ANAEROBIC [place]  Final   Culture  Setup Time   Final    Performed at [place] AEROBIC AND ANAEROBIC BOTTLES Gram Stain Report Called to,Read Back By and Verified With: STONE,B AT 1930 ON 09/05/2016 BY ISLEY,B CRITICAL RESULT CALLED TO, READ BACK BY AND VERIFIED WITH: N LIGHTNER,RN '@[id number]'$  09/06/16 MKELLY,MLT Performed at [place] (A)  Final   Report Status 09/08/2016 FINAL  Final   Organism ID, Bacteria ESCHERICHIA COLI  Final      Susceptibility   Escherichia coli - MIC*     AMPICILLIN >=32 RESISTANT Resistant     CEFAZOLIN <=4  SENSITIVE Sensitive     CEFEPIME <=1 SENSITIVE Sensitive     CEFTAZIDIME <=1 SENSITIVE Sensitive     CEFTRIAXONE <=1 SENSITIVE Sensitive     CIPROFLOXACIN <=0.25 SENSITIVE Sensitive     GENTAMICIN <=1 SENSITIVE Sensitive     IMIPENEM <=0.25 SENSITIVE Sensitive     TRIMETH/SULFA <=20 SENSITIVE Sensitive     AMPICILLIN/SULBACTAM 16 INTERMEDIATE Intermediate     PIP/TAZO <=4 SENSITIVE Sensitive     Extended ESBL NEGATIVE Sensitive     * ESCHERICHIA COLI  Blood Culture ID Panel (Reflexed)     Status: Abnormal   Collection Time: 09/05/16  7:21 AM  Result Value Ref Range Status   Enterococcus species NOT DETECTED NOT DETECTED Final   Listeria monocytogenes NOT DETECTED NOT DETECTED Final   Staphylococcus species NOT DETECTED NOT DETECTED Final   Staphylococcus aureus NOT DETECTED NOT DETECTED Final   Streptococcus species NOT DETECTED NOT DETECTED Final   Streptococcus agalactiae NOT DETECTED NOT DETECTED Final   Streptococcus pneumoniae NOT DETECTED NOT DETECTED Final   Streptococcus pyogenes NOT DETECTED NOT DETECTED Final   Acinetobacter baumannii NOT DETECTED NOT DETECTED Final   Enterobacteriaceae species DETECTED (A) NOT DETECTED Final    Comment: CRITICAL RESULT CALLED TO, READ BACK BY AND VERIFIED WITH: N LIGHTNER,RN '@[id number]'$  09/06/16 MKELLY,MLT    Enterobacter cloacae complex NOT DETECTED NOT DETECTED Final   Escherichia coli DETECTED (A) NOT DETECTED Final    Comment: CRITICAL RESULT CALLED TO, READ BACK BY AND VERIFIED WITH: N LIGHTNER,RN '@0312'$  09/06/16 MKELLY,MLT    Klebsiella oxytoca NOT DETECTED NOT DETECTED Final   Klebsiella pneumoniae NOT DETECTED NOT DETECTED Final   Proteus species NOT DETECTED NOT DETECTED Final   Serratia marcescens NOT DETECTED NOT DETECTED Final   Carbapenem resistance NOT DETECTED NOT DETECTED Final   Haemophilus influenzae NOT DETECTED NOT DETECTED Final   Neisseria meningitidis NOT DETECTED  NOT DETECTED Final   Pseudomonas aeruginosa NOT DETECTED NOT DETECTED Final   Candida albicans NOT DETECTED NOT DETECTED Final   Candida glabrata NOT DETECTED NOT DETECTED Final   Candida krusei NOT DETECTED NOT DETECTED Final   Candida parapsilosis NOT DETECTED NOT DETECTED Final   Candida tropicalis NOT DETECTED NOT DETECTED Final    Comment: Performed at [facility]  Blood Culture (routine x 2)     Status: Abnormal   Collection Time: 09/05/16  7:25 AM  Result Value Ref Range Status   Specimen Description BLOOD RIGHT HAND  Final   Special Requests BOTTLES DRAWN AEROBIC AND ANAEROBIC 8CC  Final   Culture  Setup Time   Final    Performed at [place] IN BOTH AEROBIC AND ANAEROBIC BOTTLES Gram Stain Report Called to,Read Back By and Verified With: STONE,B AT 1930 ON 09/05/2016 BY ISLEY,B    Culture (A)  Final    ESCHERICHIA COLI SUSCEPTIBILITIES PERFORMED ON PREVIOUS CULTURE WITHIN THE LAST 5 DAYS. Performed at [facility]    Report Status 09/08/2016 FINAL  Final  Urine culture     Status: Abnormal   Collection Time: 09/05/16  1:10 PM  Result Value Ref Range Status   Specimen Description URINE, RANDOM  Final   Special Requests NONE  Final   Culture MULTIPLE SPECIES PRESENT, SUGGEST RECOLLECTION (A)  Final   Report Status 09/07/2016 FINAL  Final  Respiratory Panel by PCR     Status: None   Collection Time: 09/05/16  2:22 PM  Result Value Ref Range Status  Adenovirus NOT DETECTED NOT DETECTED Final   Coronavirus 229E NOT DETECTED NOT DETECTED Final   Coronavirus HKU1 NOT DETECTED NOT DETECTED Final   Coronavirus NL63 NOT DETECTED NOT DETECTED Final   Coronavirus OC43 NOT DETECTED NOT DETECTED Final   Metapneumovirus NOT DETECTED NOT DETECTED Final   Rhinovirus / Enterovirus NOT DETECTED NOT DETECTED Final   Influenza A NOT DETECTED NOT DETECTED Final   Influenza B NOT DETECTED NOT DETECTED Final   Parainfluenza Virus 1 NOT DETECTED  NOT DETECTED Final   Parainfluenza Virus 2 NOT DETECTED NOT DETECTED Final   Parainfluenza Virus 3 NOT DETECTED NOT DETECTED Final   Parainfluenza Virus 4 NOT DETECTED NOT DETECTED Final   Respiratory Syncytial Virus NOT DETECTED NOT DETECTED Final   Bordetella pertussis NOT DETECTED NOT DETECTED Final   Chlamydophila pneumoniae NOT DETECTED NOT DETECTED Final   Mycoplasma pneumoniae NOT DETECTED NOT DETECTED Final    Comment: Performed at [facility]  Culture, blood (routine x 2)     Status: None (Preliminary result)   Collection Time: 09/07/16 11:58 AM  Result Value Ref Range Status   Specimen Description BLOOD RIGHT WRIST  Final   Special Requests BOTTLES DRAWN AEROBIC AND ANAEROBIC [place]  Final   Culture NO GROWTH 3 DAYS  Final   Report Status PENDING  Incomplete  Culture, blood (routine x 2)     Status: None (Preliminary result)   Collection Time: 09/07/16 12:08 PM  Result Value Ref Range Status   Specimen Description BLOOD RIGHT HAND  Final   Special Requests BOTTLES DRAWN AEROBIC ONLY 4CC ONLY  Final   Culture NO GROWTH 3 DAYS  Final   Report Status PENDING  Incomplete  Aerobic/Anaerobic Culture (surgical/deep wound)     Status: None (Preliminary result)   Collection Time: 09/08/16 12:11 PM  Result Value Ref Range Status   Specimen Description ABSCESS GALL BLADDER  Final   Special Requests Normal  Final   Gram Stain   Final    RARE WBC PRESENT,BOTH PMN AND MONONUCLEAR NO ORGANISMS SEEN    Culture   Final    MODERATE ESCHERICHIA COLI MODERATE GRAM NEGATIVE RODS    Report Status PENDING  Incomplete   Organism ID, Bacteria ESCHERICHIA COLI  Final      Susceptibility   Escherichia coli - MIC*    AMPICILLIN >=32 RESISTANT Resistant     CEFAZOLIN <=4 SENSITIVE Sensitive     CEFEPIME <=1 SENSITIVE Sensitive     CEFTAZIDIME <=1 SENSITIVE Sensitive     CEFTRIAXONE <=1 SENSITIVE Sensitive     CIPROFLOXACIN <=0.25 SENSITIVE Sensitive     GENTAMICIN <=1 SENSITIVE  Sensitive     IMIPENEM <=0.25 SENSITIVE Sensitive     TRIMETH/SULFA <=20 SENSITIVE Sensitive     AMPICILLIN/SULBACTAM 16 INTERMEDIATE Intermediate     PIP/TAZO <=4 SENSITIVE Sensitive     Extended ESBL NEGATIVE Sensitive     * MODERATE ESCHERICHIA COLI  MRSA PCR Screening     Status: None   Collection Time: 09/09/16  4:49 PM  Result Value Ref Range Status   MRSA by PCR NEGATIVE NEGATIVE Final    Comment:        The GeneXpert MRSA Assay (FDA approved for NASAL specimens only), is one component of a comprehensive MRSA colonization surveillance program. It is not intended to diagnose MRSA infection nor to guide or monitor treatment for MRSA infections.          Radiology Studies: Dg Chest Port 1  View  Result Date: 09/09/2016 CLINICAL DATA:  Shortness of breath, wheezing. EXAM: PORTABLE CHEST 1 VIEW COMPARISON:  Radiograph August 07, 2017. FINDINGS: Stable cardiomediastinal silhouette. No pneumothorax is noted. Stable left midlung atelectasis is noted. Stable right basilar opacity is noted concerning for atelectasis or infiltrate. Bony thorax is unremarkable. IMPRESSION: Stable left midlung and right basilar opacities are noted concerning for atelectasis or possibly infiltrate. Electronically Signed   By: Marijo Conception, M.D.   On: 09/09/2016 09:50   Dg Abd Portable 1v  Result Date: 09/10/2016 CLINICAL DATA:  Abdominal pain.  Cholecystostomy. EXAM: PORTABLE ABDOMEN - 1 VIEW COMPARISON:  CT 09/07/2016 FINDINGS: Pigtail catheter right upper quadrant in satisfactory position most likely within the gallbladder. Normal bowel gas pattern. No obstruction or ileus. Small amount of contrast or ingested tablets in the stomach. IMPRESSION: Cholecystostomy tube in good position.  Normal bowel gas pattern. Electronically Signed   By: Franchot Gallo M.D.   On: 09/10/2016 15:38        Scheduled Meds: . aspirin EC  81 mg Oral Daily  . cefTRIAXone (ROCEPHIN)  IV  2 g Intravenous Q24H  .  cholecalciferol  1,000 Units Oral Daily  . cloNIDine  0.2 mg Oral BID  . diltiazem  120 mg Oral Daily  . insulin aspart  0-9 Units Subcutaneous Q4H  . insulin aspart  5 Units Subcutaneous TID WC  . insulin glargine  40 Units Subcutaneous QHS  . lanthanum  1,000 mg Oral TID WC  . levalbuterol  0.63 mg Nebulization TID  .  morphine injection  1 mg Intravenous Once  . pantoprazole  40 mg Oral Daily  . predniSONE  50 mg Oral Q breakfast  . saccharomyces boulardii  250 mg Oral BID  . senna-docusate  1 tablet Oral QHS   Continuous Infusions:    LOS: 6 days    Reyne Dumas, MD [facility] Pager [phone]  If 7PM-7AM, please contact night-coverage [email or web address] Password [email or web address] 09/11/2016, 9:32 AM

## 2016-09-11 NOTE — Progress Notes (Signed)
Patient Name: Gary Lang Date of Encounter: 09/11/2016  Primary Cardiologist: Dr. [facility] Problem List     Active Problems:   Community acquired pneumonia of right lower lobe of lung ([place])   Acute respiratory failure with hypoxia ([place])   Acute cholecystitis due to biliary calculus   AKI (acute kidney injury) [facility])    Patient Profile     66 y/o male with h/o CKD, RA, DM and HTN, who presented on 09/05/16 with complaint of right sided chest and RUQ abdominal pain, fever and chills, found to have right sided PNA and acute cholecystitis, now s/p percutaneous cholecystostomy. Also being treated for Sepsis due to E coli bacteremia. Hospitalization complicated by new onset atrial fibrillation.    Subjective   Pt had significant abdominal pain yesterday but is better today. Has mild right sided abdominal discomfort. No chest pain. Does had dyspnea with minimal exertion.  Inpatient Medications    Scheduled Meds: . aspirin EC  81 mg Oral Daily  . cholecalciferol  1,000 Units Oral Daily  . cloNIDine  0.2 mg Oral BID  . diltiazem  120 mg Oral Once  . [START ON 09/12/2016] diltiazem  240 mg Oral Daily  . insulin aspart  0-9 Units Subcutaneous Q4H  . insulin aspart  5 Units Subcutaneous TID WC  . insulin glargine  40 Units Subcutaneous QHS  . lanthanum  1,000 mg Oral TID WC  . levalbuterol  0.63 mg Nebulization TID  .  morphine injection  1 mg Intravenous Once  . pantoprazole  40 mg Oral Daily  . predniSONE  50 mg Oral Q breakfast  . saccharomyces boulardii  250 mg Oral BID  . senna-docusate  1 tablet Oral QHS   Continuous Infusions:  PRN Meds: alum & mag hydroxide-simeth, bisacodyl, dextrose, hydrALAZINE, HYDROmorphone (DILAUDID) injection, metoCLOPramide (REGLAN) injection, metoprolol, simethicone   Vital Signs    Vitals:   09/11/16 0600 09/11/16 0734 09/11/16 0757 09/11/16 0900  BP: (!) 155/94 (!) 186/79    Pulse: (!) 137 (!) 109  (!) 110  Resp: (!) 23 (!)  30  (!) 31  Temp:  97.8 F (36.6 C)    TempSrc:  Oral    SpO2:  95% 96% 95%  Weight:      Height:        Intake/Output Summary (Last 24 hours) at 09/11/16 0942 Last data filed at 09/11/16 0900  Gross per 24 hour  Intake              850 ml  Output             2525 ml  Net            -1675 ml   Filed Weights   09/05/16 0729 09/05/16 1442 09/07/16 1459  Weight: 225 lb (102.1 kg) 225 lb (102.1 kg) 245 lb 9.5 oz (111.4 kg)    Physical Exam   GEN: Well nourished, well developed, in no acute distress.  HEENT: Grossly normal.  Neck: Supple, no JVD, carotid bruits, or masses. Cardiac: RRR, no murmurs, rubs, or gallops. No clubbing, cyanosis, edema.  Radials/DP/PT 2+ and equal bilaterally.  Respiratory:  Respirations regular with slight increase in work of breathing, Expiratory wheezes bilaterally. GI:  tender, taught, BS minimal, drainage tube noted in RUQ MS: no deformity or atrophy. Skin: warm and dry, no rash. Neuro:  Strength and sensation are intact. Psych: AAOx3.  Normal affect.  Labs    CBC  Recent Labs  09/09/16 0340 09/11/16 0359  WBC 11.4* 19.7*  HGB 10.6* 12.7*  HCT 32.2* 36.9*  MCV 81.7 78.3  PLT 180 867   Basic Metabolic Panel  Recent Labs  09/09/16 0938  09/10/16 0356 09/11/16 0359  NA  --   < > 137 139  K  --   < > 4.5 4.1  CL  --   < > 106 105  CO2  --   < > 18* 15*  GLUCOSE  --   < > 299* 205*  BUN  --   < > 81* 102*  CREATININE  --   < > 7.25* 7.91*  CALCIUM  --   < > 8.0* 8.2*  MG 2.3  --   --   --   < > = values in this interval not displayed. Liver Function Tests  Recent Labs  09/09/16 0340 09/10/16 0356  AST 57* 30  ALT 47 39  ALKPHOS 128* 111  BILITOT 1.2 1.1  PROT 6.4* 6.2*  ALBUMIN 1.7* 1.6*    Recent Labs  09/08/16 1017  LIPASE 71*   Cardiac Enzymes  Recent Labs  09/09/16 0938 09/09/16 1547 09/09/16 2130  TROPONINI 0.04* 0.15* 0.04*   BNP Invalid input(s): POCBNP D-Dimer No results for input(s): DDIMER  in the last 72 hours. Hemoglobin A1C No results for input(s): HGBA1C in the last 72 hours. Fasting Lipid Panel No results for input(s): CHOL, HDL, LDLCALC, TRIG, CHOLHDL, LDLDIRECT in the last 72 hours. Thyroid Function Tests  Recent Labs  09/09/16 0938  TSH 0.457    Telemetry    Sinus tachycardia in the low 100's. Rapid afib noted from [id number] - Personally Reviewed  EKG  09/11/16 0529- Atrial fibrillation at 133 bpm 09/11/16 0939- sinus tachycardia 107 bpm 09/11/16 1037- Atrial flutter, 2:1 conduction 152 bpm  Radiology    Dg Chest Port 1 View  Result Date: 09/09/2016 CLINICAL DATA:  Shortness of breath, wheezing. EXAM: PORTABLE CHEST 1 VIEW COMPARISON:  Radiograph August 07, 2017. FINDINGS: Stable cardiomediastinal silhouette. No pneumothorax is noted. Stable left midlung atelectasis is noted. Stable right basilar opacity is noted concerning for atelectasis or infiltrate. Bony thorax is unremarkable. IMPRESSION: Stable left midlung and right basilar opacities are noted concerning for atelectasis or possibly infiltrate. Electronically Signed   By: Marijo Conception, M.D.   On: 09/09/2016 09:50   Dg Abd Portable 1v  Result Date: 09/10/2016 CLINICAL DATA:  Abdominal pain.  Cholecystostomy. EXAM: PORTABLE ABDOMEN - 1 VIEW COMPARISON:  CT 09/07/2016 FINDINGS: Pigtail catheter right upper quadrant in satisfactory position most likely within the gallbladder. Normal bowel gas pattern. No obstruction or ileus. Small amount of contrast or ingested tablets in the stomach. IMPRESSION: Cholecystostomy tube in good position.  Normal bowel gas pattern. Electronically Signed   By: Franchot Gallo M.D.   On: 09/10/2016 15:38    Cardiac Studies   2D Echo 09/09/16  Study Conclusions  - Left ventricle: The cavity size was normal. Wall thickness was   increased in a pattern of moderate LVH. Systolic function was   vigorous. The estimated ejection fraction was in the range of 65%   to 70%. Wall  motion was normal; there were no regional wall   motion abnormalities. Doppler parameters are consistent with   abnormal left ventricular relaxation (grade 1 diastolic   dysfunction). - Aortic valve: Valve area (VTI): 2.67 cm^2. Valve area (Vmax):   2.21 cm^2. Valve area (Vmean): 2.53 cm^2. - Atrial septum: No defect or patent  foramen ovale was identified. - Pulmonary arteries: Systolic pressure was moderately increased.   PA peak pressure: 40 mm Hg (S). - Technically adequate study.  Patient Profile     66 y/o male with h/o CKD, RA, DM and HTN, who presented on 09/05/16 with complaint of right sided chest and RUQ abdominal pain, fever and chills, found to have right sided PNA and acute cholecystitis, now s/p percutaneous cholecystostomy. Also being treated for Sepsis due to E coli bacteremia. Hospitalization complicated by new onset atrial fibrillation.   Assessment & Plan    1. Paroxysmal Atrial Fibrillation -pt notes long history of having an irregular heart beat, but no previously documented reports of atrial fibrillation/flutter. New diagnosis of atrial fibrillation noted this admission, in the setting of acute right sided CAP and acute cholecystitis. He is being treated conservatively with antibiotics and percutaneous chole drain (not a current surgical candidate for lap chole, at this time, given active PNA).  ---He spontaneously converted back to NSR on telemetry, and cardizem drip was converted to oral dosing at 120 mg. This morning 1/10 he developed atrial fib with RVR. Was given extra cardizem 10 mg IV and metoprolol 5 mg IV and converted to ST for a couple of hours, then back to afib with RVR. Cardizem was increased to 240 mg to start tomorrow. Have added short acting cardizem 30 mg q 6h for the remainder of today. Most likely related to stress of ongoing medical issues, pneumonia and his increased abdominal pain. --->Continues to be in aflutter with rates in the 150's. Have ordered  cardizem 10 mg IV -2D echo shows normal LVEF and normal wall motion. -Continue to treat underlying medical illnesses and monitor on telemetry. -No ischemic CP to suggest underlying CAD. No plans for inpatient ischemic w/u at this time.  -His CHA2DS2 VASc score is at least 3 for HTN, DM and age 68-74. Given CKD, his options are limited to Coumadin. Recommend initiation of Coumadin, per pharmacy, once cleared from a surgical standpoint. INR goal of 2-3. We will continue to follow along with you.   2. Leukocytosis -WBC's up from 11.4 (1/8) to 19.7 (1/10), afebrile -Continues to be treated for pneumonia, steroids may be contributing -Elevated heart rate, ST and afib likely related to acute medical processes- being evaluated by IM and surgery   Signed, Daune Perch, NP  09/11/2016, 9:42 AM   History and all data above reviewed.  Patient examined.  I agree with the findings as above.  The patient exam reveals YQM:[id number]  ,  Lungs: Decreased breath sounds  ,  Abd: Distended, Ext Mild/mod diffuse edema  .  All available labs, radiology testing, previous records reviewed. Agree with documented assessment and plan. Atrial flutter:  IV dilt for rate control.  Titrated as needed.  Looks more uncomfortable than yesterday.  About to start CVVH.    Jeneen Rinks Jodilyn Giese  3:57 PM  09/11/2016

## 2016-09-11 NOTE — Progress Notes (Signed)
Pharmacy Antibiotic Note TORREZ RENFROE is a 66 y.o. male admitted on 09/05/2016 with E.coli bacteremia 2/2 to cholecystitis that is s/p per chole drain 1/7.  Worsening acute on chronic kidney disease as well increase leukocytosis o/n and pharmacy asked to broaden abx from ceftriaxone to Zosyn.   Plan: 1. Zosyn 2.25 grams IV every 6 hours (infused over 30 minutes) based on current renal function 2. Follow up clinical response and any change in renal function and empirically adjust antibiotics as needed   Height: '6\' 1"'$  (185.4 cm) Weight: 245 lb 9.5 oz (111.4 kg) IBW/kg (Calculated) : 79.9  Temp (24hrs), Avg:97.6 F (36.4 C), Min:97.5 F (36.4 C), Max:97.8 F (36.6 C)   Recent Labs Lab 09/05/16 0738 09/05/16 1010 09/06/16 0621 09/07/16 0719  09/08/16 1017 09/09/16 0340 09/09/16 2130 09/10/16 0356 09/11/16 0359  WBC  --   --  10.8* 11.4*  --  11.3* 11.4*  --   --  19.7*  CREATININE  --   --  2.67* 3.93*  < > 5.95* 6.67* 7.05* 7.25* 7.91*  LATICACIDVEN 2.80* 0.99  --   --   --   --   --   --   --   --   < > = values in this interval not displayed.  Estimated Creatinine Clearance: 12 mL/min (by C-G formula based on SCr of 7.91 mg/dL (H)).    Allergies  Allergen Reactions  . Glyburide Other (See Comments)    unknown  . Methotrexate Derivatives Other (See Comments)    Patient cannot remember   Antimicrobials this admission:  Ceftriaxone 1/4>>1/4>> 1/9 x 1 dose Azithromycin 1/4>>1/7  Zosyn 1/4>>1/9, /10 >>   Microbiology results: 1/4BCx: E.coli R to AMP and AMP/SULB 1/4UCx: multiple species  1/6 Repeat BCx: NGTD 1/7 gallbladder cx: E.coli R to AMP and AMP/SULB   Thank you for allowing pharmacy to be a part of this patient's care.  Vincenza Hews, PharmD, BCPS 09/11/2016, 10:02 AM Pager: [phone]

## 2016-09-11 NOTE — Progress Notes (Signed)
Inpatient Diabetes Program Recommendations  AACE/ADA: New Consensus Statement on Inpatient Glycemic Control (2015)  Target Ranges:  Prepandial:   less than 140 mg/dL      Peak postprandial:   less than 180 mg/dL (1-2 hours)      Critically ill patients:  140 - 180 mg/dL   Lab Results  Component Value Date   GLUCAP 177 (H) 09/11/2016   HGBA1C 11.4 01/31/2014    Review of Glycemic Control  Diabetes history: DM, CKD, steroids this admission Outpatient Diabetes medications: Trulicity 1.5 mg Qweek (Wed), Novolog 18 TID, Lantus 40 QAM (+ 5 if >200 or -5 if < 150) Current orders for Inpatient glycemic control: Lantus 40 QHS, Novolog 0 to 9 units Q4H, Novolog 5 units TIDAC meal coverage  Inpatient Diabetes Program Recommendations:     Please consider increasing Novolog meal coverage to 7 units TID if eating at least 50% of meals.     Per ADA recommendations "consider performing an A1C on all patients with diabetes or hyperglycemia admitted to the hospital if not performed in the prior 3 months".  Thank you,  Windy Carina, RN, MSN Diabetes Coordinator Inpatient Diabetes Program [phone] (Team Pager)

## 2016-09-11 NOTE — Progress Notes (Addendum)
ANTICOAGULATION CONSULT NOTE - Initial Consult  Pharmacy Consult for Heparin Indication: atrial fibrillation  Allergies  Allergen Reactions  . Glyburide Other (See Comments)    unknown  . Methotrexate Derivatives Other (See Comments)    Patient cannot remember    Patient Measurements: Height: '6\' 1"'$  (185.4 cm) Weight: 245 lb 9.5 oz (111.4 kg) IBW/kg (Calculated) : 79.9 Heparin Dosing Weight: 103 kg  Vital Signs: Temp: 98.5 F (36.9 C) (01/10 1430) Temp Source: Oral (01/10 1430) BP: 165/95 (01/10 1144) Pulse Rate: 127 (01/10 1144)  Labs:  Recent Labs  09/09/16 0340 09/09/16 0938 09/09/16 1547 09/09/16 2130 09/10/16 0356 09/11/16 0359  HGB 10.6*  --   --   --   --  12.7*  HCT 32.2*  --   --   --   --  36.9*  PLT 180  --   --   --   --  292  CREATININE 6.67*  --   --  7.05* 7.25* 7.91*  TROPONINI  --  0.04* 0.15* 0.04*  --   --     Estimated Creatinine Clearance: 12 mL/min (by C-G formula based on SCr of 7.91 mg/dL (H)).   Medical History: Past Medical History:  Diagnosis Date  . Chronic kidney disease   . Diabetes mellitus without complication ([place])   . Hypertension   . Rheumatoid arthritis ([place])   . Vitamin D deficiency    Hx: of    Medications:  Prescriptions Prior to Admission  Medication Sig Dispense Refill Last Dose  . acetaminophen (TYLENOL) 500 MG tablet Take 2 tablets (1,000 mg total) by mouth 3 (three) times daily as needed for pain.   Past Week at Unknown time  . aspirin 81 MG EC tablet Take 1 tablet (81 mg total) by mouth daily. Swallow whole.   09/04/2016 at Unknown time  . atorvastatin (LIPITOR) 40 MG tablet Take 1 tablet by mouth daily.   09/04/2016 at Unknown time  . cholecalciferol (VITAMIN D) 1000 units tablet Take 1,000 Units by mouth daily.   09/04/2016 at Unknown time  . cloNIDine (CATAPRES) 0.2 MG tablet Take 0.2 mg by mouth daily.   09/04/2016 at Unknown time  . dicyclomine (BENTYL) 20 MG tablet Take 1 tablet (20 mg total) by mouth 2 (two)  times daily as needed for spasms. 20 tablet 0 unknown  . diltiazem (DILACOR XR) 180 MG 24 hr capsule Take 1 capsule (180 mg total) by mouth daily. 90 capsule 3 09/04/2016 at Unknown time  . furosemide (LASIX) 80 MG tablet Take 80 mg by mouth daily.    09/04/2016 at Unknown time  . insulin aspart (NOVOLOG FLEXPEN) 100 UNIT/ML FlexPen Inject 7.5 Units into the skin 3 (three) times daily with meals. (Patient taking differently: Inject 18 Units into the skin 3 (three) times daily with meals. ) 5 pen 6 09/04/2016 at Unknown time  . Insulin Glargine (LANTUS) 100 UNIT/ML Solostar Pen Inject 20 Units into the skin every morning. Increase 5 u if am CBG is greater than 200.  Decrease 5 u if am CBG is less than 150 (Patient taking differently: Inject 40 Units into the skin every morning. Increase 5 u if am CBG is greater than 200.  Decrease 5 u if am CBG is less than 150) 5 pen 2 09/04/2016 at Unknown time  . loperamide (IMODIUM) 2 MG capsule Take 1 capsule (2 mg total) by mouth 4 (four) times daily as needed for diarrhea or loose stools. 12 capsule 0 unknown  .  Omega-3 Fatty Acids (FISH OIL PO) Take 1 capsule by mouth daily.   09/04/2016 at Unknown time  . ondansetron (ZOFRAN ODT) 4 MG disintegrating tablet Take 1 tablet (4 mg total) by mouth every 8 (eight) hours as needed for nausea or vomiting. 20 tablet 0 unknown  . sildenafil (VIAGRA) 50 MG tablet Take 0.5-1 tablets (25-50 mg total) by mouth daily as needed for erectile dysfunction. 10 tablet 0 unknown  . TRULICITY 1.5 HG/9.9ME SOPN Inject 1 Units as directed once a week. Patient isn't sure how many units he takes. Patient takes on Wednesday's.   08/28/2016 at Unknown time  . Insulin Pen Needle (PEN NEEDLES) 31G X 6 MM MISC Use 1 needle per injection as directed 100 each 1 09/02/2016 at Unknown time  . UNIFINE PENTIPS 31G X 5 MM MISC USE AS DIRECTED WITH INSULIN 100 each PRN 09/02/2016 at Unknown time   Scheduled:  . aspirin EC  81 mg Oral Daily  . cholecalciferol   1,000 Units Oral Daily  . cloNIDine  0.2 mg Oral TID  . hydrALAZINE  25 mg Oral Q8H  . insulin aspart  0-9 Units Subcutaneous Q4H  . insulin aspart  5 Units Subcutaneous TID WC  . insulin glargine  40 Units Subcutaneous QHS  . lanthanum  1,000 mg Oral TID WC  . levalbuterol  0.63 mg Nebulization TID  . pantoprazole  40 mg Oral Daily  . piperacillin-tazobactam (ZOSYN)  IV  2.25 g Intravenous Q6H  . [START ON 09/12/2016] predniSONE  40 mg Oral Q breakfast  . saccharomyces boulardii  250 mg Oral BID  . senna-docusate  1 tablet Oral QHS  . sodium bicarbonate  1,300 mg Oral TID   Infusions:  . diltiazem (CARDIZEM) infusion 5 mg/hr (09/11/16 1449)  . dialysis replacement fluid (prismasate)    . dialysis replacement fluid (prismasate)    . dialysate (PRISMASATE)      Assessment: 66yo male with new onset Afib with RVR. Pharmacy is consulted to dose heparin for atrial fibrillation once HD catheter is placed.  Goal of Therapy:  Heparin level 0.3-0.7 units/ml Monitor platelets by anticoagulation protocol: Yes   Plan:  Give 5000 units bolus x 1 Start heparin infusion at 1600 units/hr Check anti-Xa level in 8 hours and daily while on heparin Continue to monitor H&H and platelets  Andrey Cota. Diona Foley, PharmD, BCPS Clinical Pharmacist Pager [phone] 09/11/2016,2:53 PM   =========================================  Addendum: - received phone call from RN that catheter is placed and MD wants to start heparin in 20 minutes - entered order to start at 1615, order as above except reduce bolus to 2500 units.    Greycen Felter D. Mina Marble, PharmD, BCPS Pager:  [phone] 09/11/2016, 3:53 PM

## 2016-09-11 NOTE — Progress Notes (Addendum)
Patient ID: Gary Lang, male   DOB: 07/29/1951, 66 y.o.   MRN: [id number] [facility] Progress Note   Assessment/ Plan:   1. 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 Appears hemodynamically mediated acute on chronic renal failure-data suggestive of sepsis associated ATN. Maintains good urine output response to furosemide, will decrease the dose of this based on chest x-rays results if he has no pulmonary edema to limit intravascular volume contraction. We'll start him on some oral sodium bicarbonate for his worsening metabolic acidosis.  2. Right lower lobe pneumonia/acute cholecystitis: On broad-spectrum antibiotic therapy and status post percutaneous cholecystostomy drain. Acute worsening of abdominal pain and shortness of breath this morning-chest x-ray and CT scan of abdomen with oral contrast.  3. Hyponatremia/volume overload: Secondary to free water excretion defect associated with acute renal insufficiency. Sodium levels improving with diuresis. 4. Hypertension: Remains significantly hypertensive this morning-partly exacerbated by his abdominal pain/distress. Start on hydralazine 25 mg 3 times a day in addition to recent increase in diltiazem dose 5. Hyperphosphatemia: On phosphorus binders. 6. Atrial flutter with rapid ventricular response: Cardiology following him, on diltiazem for rate control Subjective:   With increasing abdominal pain and shortness of breath noted overnight. This morning with significant tachycardia-EKG showing atrial flutter with RVR. He reports cough with increased sputum production    Objective:   BP (!) 186/79 (BP Location: Right Leg)   Pulse (!) 110   Temp 97.8 F (36.6 C) (Oral)   Resp (!) 31   Ht '6\' 1"'$  (1.854 m)   Wt 111.4 kg (245 lb 9.5 oz)   SpO2 95%   BMI 32.40 kg/m   Intake/Output Summary (Last 24 hours) at 09/11/16 1043 Last data filed at  09/11/16 0900  Gross per 24 hour  Intake              850 ml  Output             2525 ml  Net            -1675 ml   Weight change:   Physical Exam: Gen: Appears to be uncomfortable resting in bed, nurse at bedside administering medications  CVS: Pulse irregularly irregular tachycardic  Resp: Clear to auscultation, no rales Abd: Soft, obese, nontender, RUQ drain in situ Ext: Trace to 1+ lower extremity edema  Imaging: Dg Abd Portable 1v  Result Date: 09/10/2016 CLINICAL DATA:  Abdominal pain.  Cholecystostomy. EXAM: PORTABLE ABDOMEN - 1 VIEW COMPARISON:  CT 09/07/2016 FINDINGS: Pigtail catheter right upper quadrant in satisfactory position most likely within the gallbladder. Normal bowel gas pattern. No obstruction or ileus. Small amount of contrast or ingested tablets in the stomach. IMPRESSION: Cholecystostomy tube in good position.  Normal bowel gas pattern. Electronically Signed   By: Franchot Gallo M.D.   On: 09/10/2016 15:38    Labs: BMET  Recent Labs Lab 09/07/16 0719 09/07/16 1638 09/08/16 1017 09/09/16 0340 09/09/16 2130 09/10/16 0356 09/11/16 0359  NA 136 138 140 134* 135 137 139  K 3.9 4.1 4.1 5.0 4.2 4.5 4.1  CL 103 105 110 106 105 106 105  CO2 24 24 20* 17* 16* 18* 15*  GLUCOSE 300* 265* 102* 376* 410* 299* 205*  BUN 39* 44* 51* 65* 80* 81* 102*  CREATININE 3.93* 4.48* 5.95* 6.67* 7.05* 7.25* 7.91*  CALCIUM 8.2* 8.2* 8.3* 8.0* 8.0* 8.0* 8.2*   CBC  Recent Labs Lab 09/05/16  0301  09/07/16 0719 09/08/16 1017 09/09/16 0340 09/11/16 0359  WBC 12.5*  < > 11.4* 11.3* 11.4* 19.7*  NEUTROABS 11.4*  --   --   --   --   --   HGB 13.5  < > 11.4* 10.4* 10.6* 12.7*  HCT 40.8  < > 35.3* 31.8* 32.2* 36.9*  MCV 84.1  < > 83.8 81.1 81.7 78.3  PLT 137*  < > 130* 162 180 292  < > = values in this interval not displayed.  Medications:    . aspirin EC  81 mg Oral Daily  . cholecalciferol  1,000 Units Oral Daily  . cloNIDine  0.2 mg Oral TID  . [START ON  09/12/2016] diltiazem  240 mg Oral Daily  . diltiazem  30 mg Oral Q6H  . hydrALAZINE  25 mg Oral Q8H  . insulin aspart  0-9 Units Subcutaneous Q4H  . insulin aspart  5 Units Subcutaneous TID WC  . insulin glargine  40 Units Subcutaneous QHS  . lanthanum  1,000 mg Oral TID WC  . levalbuterol  0.63 mg Nebulization TID  .  morphine injection  1 mg Intravenous Once  . pantoprazole  40 mg Oral Daily  . piperacillin-tazobactam (ZOSYN)  IV  2.25 g Intravenous Q6H  . [START ON 09/12/2016] predniSONE  40 mg Oral Q breakfast  . saccharomyces boulardii  250 mg Oral BID  . senna-docusate  1 tablet Oral QHS   Elmarie Shiley, MD 09/11/2016, 10:43 AM

## 2016-09-12 ENCOUNTER — Inpatient Hospital Stay (HOSPITAL_COMMUNITY): Payer: PPO

## 2016-09-12 DIAGNOSIS — R509 Fever, unspecified: Secondary | ICD-10-CM

## 2016-09-12 DIAGNOSIS — R109 Unspecified abdominal pain: Secondary | ICD-10-CM

## 2016-09-12 LAB — RENAL FUNCTION PANEL
ALBUMIN: 1.7 g/dL — AB (ref 3.5–5.0)
ANION GAP: 12 (ref 5–15)
ANION GAP: 11 (ref 5–15)
Albumin: 1.7 g/dL — ABNORMAL LOW (ref 3.5–5.0)
BUN: 77 mg/dL — AB (ref 6–20)
BUN: 54 mg/dL — ABNORMAL HIGH (ref 6–20)
CHLORIDE: 105 mmol/L (ref 101–111)
CHLORIDE: 102 mmol/L (ref 101–111)
CO2: 22 mmol/L (ref 22–32)
CO2: 23 mmol/L (ref 22–32)
Calcium: 7.8 mg/dL — ABNORMAL LOW (ref 8.9–10.3)
Calcium: 7.8 mg/dL — ABNORMAL LOW (ref 8.9–10.3)
Creatinine, Ser: 5.35 mg/dL — ABNORMAL HIGH (ref 0.61–1.24)
Creatinine, Ser: 3.71 mg/dL — ABNORMAL HIGH (ref 0.61–1.24)
GFR calc Af Amer: 12 mL/min — ABNORMAL LOW (ref >60)
GFR calc non Af Amer: 10 mL/min — ABNORMAL LOW (ref >60)
GFR, EST AFRICAN AMERICAN: 18 mL/min — AB (ref >60)
GFR, EST NON AFRICAN AMERICAN: 16 mL/min — AB (ref >60)
GLUCOSE: 198 mg/dL — AB (ref 65–99)
Glucose, Bld: 227 mg/dL — ABNORMAL HIGH (ref 65–99)
POTASSIUM: 4 mmol/L (ref 3.5–5.1)
POTASSIUM: 4.1 mmol/L (ref 3.5–5.1)
Phosphorus: 4.9 mg/dL — ABNORMAL HIGH (ref 2.5–4.6)
Phosphorus: 3.7 mg/dL (ref 2.5–4.6)
Sodium: 139 mmol/L (ref 135–145)
Sodium: 136 mmol/L (ref 135–145)

## 2016-09-12 LAB — COMPREHENSIVE METABOLIC PANEL
ALK PHOS: 95 U/L (ref 38–126)
ALT: 30 U/L (ref 17–63)
AST: 21 U/L (ref 15–41)
Albumin: 1.8 g/dL — ABNORMAL LOW (ref 3.5–5.0)
Anion gap: 12 (ref 5–15)
BUN: 77 mg/dL — AB (ref 6–20)
CALCIUM: 7.9 mg/dL — AB (ref 8.9–10.3)
CO2: 22 mmol/L (ref 22–32)
CREATININE: 5.37 mg/dL — AB (ref 0.61–1.24)
Chloride: 105 mmol/L (ref 101–111)
GFR, EST AFRICAN AMERICAN: 12 mL/min — AB (ref >60)
GFR, EST NON AFRICAN AMERICAN: 10 mL/min — AB (ref >60)
Glucose, Bld: 197 mg/dL — ABNORMAL HIGH (ref 65–99)
Potassium: 4 mmol/L (ref 3.5–5.1)
SODIUM: 139 mmol/L (ref 135–145)
Total Bilirubin: 1.6 mg/dL — ABNORMAL HIGH (ref 0.3–1.2)
Total Protein: 6.4 g/dL — ABNORMAL LOW (ref 6.5–8.1)

## 2016-09-12 LAB — GLUCOSE, CAPILLARY
GLUCOSE-CAPILLARY: 187 mg/dL — AB (ref 65–99)
GLUCOSE-CAPILLARY: 157 mg/dL — AB (ref 65–99)
GLUCOSE-CAPILLARY: 164 mg/dL — AB (ref 65–99)
Glucose-Capillary: 221 mg/dL — ABNORMAL HIGH (ref 65–99)
Glucose-Capillary: 243 mg/dL — ABNORMAL HIGH (ref 65–99)

## 2016-09-12 LAB — CULTURE, BLOOD (ROUTINE X 2)
CULTURE: NO GROWTH
Culture: NO GROWTH

## 2016-09-12 LAB — HEPARIN LEVEL (UNFRACTIONATED)
HEPARIN UNFRACTIONATED: 0.65 [IU]/mL (ref 0.30–0.70)
HEPARIN UNFRACTIONATED: 0.54 [IU]/mL (ref 0.30–0.70)

## 2016-09-12 LAB — CBC
HCT: 34.8 % — ABNORMAL LOW (ref 39.0–52.0)
Hemoglobin: 12.1 g/dL — ABNORMAL LOW (ref 13.0–17.0)
MCH: 26.8 pg (ref 26.0–34.0)
MCHC: 34.8 g/dL (ref 30.0–36.0)
MCV: 77 fL — ABNORMAL LOW (ref 78.0–100.0)
PLATELETS: 229 10*3/uL (ref 150–400)
RBC: 4.52 MIL/uL (ref 4.22–5.81)
RDW: 15.9 % — AB (ref 11.5–15.5)
WBC: 20.7 10*3/uL — ABNORMAL HIGH (ref 4.0–10.5)

## 2016-09-12 LAB — PHOSPHORUS: PHOSPHORUS: 5 mg/dL — AB (ref 2.5–4.6)

## 2016-09-12 LAB — MAGNESIUM: Magnesium: 2.5 mg/dL — ABNORMAL HIGH (ref 1.7–2.4)

## 2016-09-12 MED ORDER — FUROSEMIDE 10 MG/ML IJ SOLN
80.0000 mg | Freq: Once | INTRAMUSCULAR | Status: AC
  Administered 2016-09-12: 80 mg via INTRAVENOUS
  Filled 2016-09-12: qty 8

## 2016-09-12 MED ORDER — DILTIAZEM HCL 25 MG/5ML IV SOLN
10.0000 mg | INTRAVENOUS | Status: DC | PRN
  Filled 2016-09-12: qty 5

## 2016-09-12 MED ORDER — DEXTROSE 5 % IV SOLN
2.0000 g | INTRAVENOUS | Status: DC
  Administered 2016-09-12 – 2016-09-15 (×4): 2 g via INTRAVENOUS
  Filled 2016-09-12 (×4): qty 2

## 2016-09-12 MED ORDER — CLONIDINE HCL 0.3 MG PO TABS
0.3000 mg | ORAL_TABLET | Freq: Three times a day (TID) | ORAL | Status: DC
  Administered 2016-09-12 – 2016-09-29 (×51): 0.3 mg via ORAL
  Filled 2016-09-12 (×32): qty 1
  Filled 2016-09-12: qty 3
  Filled 2016-09-12 (×3): qty 1
  Filled 2016-09-12: qty 3
  Filled 2016-09-12: qty 1
  Filled 2016-09-12: qty 3
  Filled 2016-09-12 (×11): qty 1
  Filled 2016-09-12: qty 3
  Filled 2016-09-12 (×2): qty 1

## 2016-09-12 MED ORDER — METOPROLOL TARTRATE 5 MG/5ML IV SOLN
5.0000 mg | INTRAVENOUS | Status: DC | PRN
  Administered 2016-09-12: 5 mg via INTRAVENOUS
  Filled 2016-09-12: qty 5

## 2016-09-12 MED ORDER — LEVALBUTEROL HCL 0.63 MG/3ML IN NEBU
0.6300 mg | INHALATION_SOLUTION | Freq: Two times a day (BID) | RESPIRATORY_TRACT | Status: DC
  Administered 2016-09-13 – 2016-09-15 (×5): 0.63 mg via RESPIRATORY_TRACT
  Filled 2016-09-12 (×5): qty 3

## 2016-09-12 MED ORDER — ACETYLCYSTEINE 20 % IN SOLN
3.0000 mL | Freq: Two times a day (BID) | RESPIRATORY_TRACT | Status: AC
  Administered 2016-09-13 (×2): 3 mL via RESPIRATORY_TRACT
  Filled 2016-09-12 (×2): qty 4

## 2016-09-12 MED ORDER — LEVALBUTEROL HCL 0.63 MG/3ML IN NEBU
0.6300 mg | INHALATION_SOLUTION | Freq: Four times a day (QID) | RESPIRATORY_TRACT | Status: DC | PRN
  Administered 2016-09-19 – 2016-09-23 (×11): 0.63 mg via RESPIRATORY_TRACT
  Filled 2016-09-12 (×14): qty 3

## 2016-09-12 MED ORDER — DILTIAZEM HCL ER COATED BEADS 120 MG PO CP24
240.0000 mg | ORAL_CAPSULE | Freq: Every day | ORAL | Status: DC
  Administered 2016-09-12 – 2016-09-29 (×18): 240 mg via ORAL
  Filled 2016-09-12: qty 2
  Filled 2016-09-12: qty 1
  Filled 2016-09-12 (×12): qty 2
  Filled 2016-09-12: qty 1
  Filled 2016-09-12 (×3): qty 2

## 2016-09-12 NOTE — Progress Notes (Signed)
1300 spoke with Dr. Titus Mould regarding elevated BP. Orders given to increased scheduled clonidine to 0.3 mg and orders for metoprolol 5 mg IV PRN.

## 2016-09-12 NOTE — Progress Notes (Signed)
Gary Lang for Heparin Indication: atrial fibrillation  Allergies  Allergen Reactions  . Glyburide Other (See Comments)    unknown  . Methotrexate Derivatives Other (See Comments)    Patient cannot remember    Patient Measurements: Height: '6\' 1"'$  (185.4 cm) Weight: 246 lb 7.6 oz (111.8 kg) IBW/kg (Calculated) : 79.9 Heparin Dosing Weight: 103 kg  Vital Signs: Temp: 97.3 F (36.3 C) (01/11 0800) Temp Source: Oral (01/11 0800) BP: 157/62 (01/11 0900) Pulse Rate: 71 (01/11 0900)  Labs:  Recent Labs  09/09/16 0938 09/09/16 1547 09/09/16 2130  09/11/16 0359 09/11/16 1700 09/12/16 0228 09/12/16 0823  HGB  --   --   --   --  12.7*  --  12.1*  --   HCT  --   --   --   --  36.9*  --  34.8*  --   PLT  --   --   --   --  292  --  229  --   HEPARINUNFRC  --   --   --   --   --   --  0.65 0.54  CREATININE  --   --  7.05*  < > 7.91* 8.03* 5.37*  5.35*  --   TROPONINI 0.04* 0.15* 0.04*  --   --   --   --   --   < > = values in this interval not displayed.  Estimated Creatinine Clearance: 17.7 mL/min (by C-G formula based on SCr of 5.37 mg/dL (H)).    Scheduled:  . acetylcysteine  3 mL Nebulization Q6H  . aspirin EC  81 mg Oral Daily  . cholecalciferol  1,000 Units Oral Daily  . cloNIDine  0.2 mg Oral TID  . hydrALAZINE  25 mg Oral Q8H  . hydrocortisone sodium succinate  50 mg Intravenous Q6H  . insulin aspart  0-9 Units Subcutaneous Q4H  . insulin aspart  5 Units Subcutaneous TID WC  . insulin glargine  40 Units Subcutaneous QHS  . lanthanum  1,000 mg Oral TID WC  . levalbuterol  0.63 mg Nebulization TID  . pantoprazole  40 mg Oral Daily  . piperacillin-tazobactam (ZOSYN)  IV  2.25 g Intravenous Q6H  . saccharomyces boulardii  250 mg Oral BID  . senna-docusate  1 tablet Oral QHS  . sodium bicarbonate  1,300 mg Oral TID   Infusions:  . diltiazem (CARDIZEM) infusion 10 mg/hr (09/12/16 0811)  . heparin 1,600 Units/hr (09/12/16  0406)  . dialysis replacement fluid (prismasate) 600 mL/hr at 09/12/16 0611  . dialysis replacement fluid (prismasate) 400 mL/hr at 09/11/16 1648  . dialysate (PRISMASATE) 1,500 mL/hr at 09/12/16 6294    Assessment: 66yo male with new onset Afib with RVR. Pharmacy is consulted to dose heparin  Confirmatory heparin level remains therapeutic at 0.54 on heparin 1600 units/hr. No issues with infusion or bleeding noted.  Goal of Therapy:  Heparin level 0.3-0.7 units/ml Monitor platelets by anticoagulation protocol: Yes   Plan:  Continue heparin 1600 units/hr Daily HL Monitor s/sx of bleeding  Gary Lang. Diona Foley, PharmD, [place] Clinical Pharmacist Pager [phone] 09/12/2016 9:33 AM

## 2016-09-12 NOTE — Progress Notes (Signed)
Subjective: Patient transferred to ICU for CRRT Abdomen soft; without complaints Perc chole tube with clear bile  Objective: Vital signs in last 24 hours: Temp:  [97.3 F (36.3 C)-98.5 F (36.9 C)] 97.3 F (36.3 C) (01/11 0800) Pulse Rate:  [69-157] 69 (01/11 0800) Resp:  [11-31] 22 (01/11 0800) BP: (110-189)/(59-131) 166/59 (01/11 0800) SpO2:  [92 %-97 %] 97 % (01/11 0800) Weight:  [111.8 kg (246 lb 7.6 oz)] 111.8 kg (246 lb 7.6 oz) (01/11 0500) Last BM Date: 09/09/16  Intake/Output from previous day: 01/10 0701 - 01/11 0700 In: 837.4 [P.O.:120; I.V.:387.4; IV Piggyback:200] Out: 0865 [Urine:1790; Drains:155] Intake/Output this shift: Total I/O In: 36 [I.V.:26; Other:10] Out: 287 [Urine:25; Drains:50; Other:212]  General appearance: alert, cooperative and no distress GI: soft, non-tender; clear bile in drain  Lab Results:   Recent Labs  09/11/16 0359 09/12/16 0228  WBC 19.7* 20.7*  HGB 12.7* 12.1*  HCT 36.9* 34.8*  PLT 292 229   BMET  Recent Labs  09/11/16 1700 09/12/16 0228  NA 140 139  139  K 4.4 4.0  4.0  CL 106 105  105  CO2 18* 22  22  GLUCOSE 134* 197*  198*  BUN 108* 77*  77*  CREATININE 8.03* 5.37*  5.35*  CALCIUM 7.8* 7.9*  7.8*   Hepatic Function Latest Ref Rng & Units 09/12/2016 09/12/2016 09/11/2016  Total Protein 6.5 - 8.1 g/dL 6.4(L) - -  Albumin 3.5 - 5.0 g/dL 1.8(L) 1.7(L) 1.7(L)  AST 15 - 41 U/L 21 - -  ALT 17 - 63 U/L 30 - -  Alk Phosphatase 38 - 126 U/L 95 - -  Total Bilirubin 0.3 - 1.2 mg/dL 1.6(H) - -    PT/INR No results for input(s): LABPROT, INR in the last 72 hours. ABG  Recent Labs  09/11/16 1627  PHART 7.402  HCO3 16.5*    Studies/Results: Ct Abdomen Pelvis Wo Contrast  Result Date: 09/11/2016 CLINICAL DATA:  Left lower quadrant abdominal pain. EXAM: CT ABDOMEN AND PELVIS WITHOUT CONTRAST TECHNIQUE: Multidetector CT imaging of the abdomen and pelvis was performed following the standard protocol without  IV contrast. COMPARISON:  09/07/2016 FINDINGS: Lower chest: Moderate right pleural effusion, increased since prior study. Consolidation in the right lower lobe. Left base atelectasis. Heart is normal size. Hepatobiliary: Interval placement of a cholecystostomy drainage catheter into the gallbladder with decompression of the gallbladder. The subcapsular fluid collection along the inferior right hepatic surface has decreased in size since prior study. Pancreas: No focal abnormality or ductal dilatation. Spleen: No focal abnormality.  Normal size. Adrenals/Urinary Tract: Perinephric stranding around the kidneys bilaterally, stable. Left renal cyst is also stable. No hydronephrosis. Adrenal glands are unremarkable. Foley catheter is present in the bladder which is decompressed. Stomach/Bowel: Colonic diverticulosis. No active diverticulitis. Stomach and small bowel are decompressed, unremarkable. Vascular/Lymphatic: No evidence of aneurysm or adenopathy. Reproductive: No visible focal abnormality. Other: Small amount of free fluid in the pelvis and adjacent to the liver. No free air. Large left inguinal hernia containing fat, stable. Musculoskeletal: No acute bony abnormality or focal bone lesion. IMPRESSION: Interval placement of cholecystostomy drainage catheter into the gallbladder with decompression. The subcapsular fluid collection along the right hepatic margin has decreased in size as well. Small amount of free fluid in the pelvis and adjacent to the liver. Increasing moderate right pleural effusion with right low consolidation. Cannot exclude pneumonia. Left base atelectasis. Left inguinal hernia containing fat. Electronically Signed   By: Rolm Baptise M.D.  On: 09/11/2016 12:20   Dg Chest Port 1 View  Result Date: 09/12/2016 CLINICAL DATA:  Respiratory failure. EXAM: PORTABLE CHEST 1 VIEW COMPARISON:  09/11/2016 . FINDINGS: Right IJ line in stable position. Stable cardiomegaly. Improved pulmonary  interstitial edema. Persistent right base atelectasis. Improved left mid lung field subsegmental atelectasis. Small right pleural effusion . No pneumothorax. IMPRESSION: 1. Right IJ line stable position. 2. Stable mild cardiomegaly. Improvement of pulmonary interstitial edema. Small right pleural effusion. 3. Persistent right base atelectasis. Improvement of left mid lung field atelectasis . Electronically Signed   By: Marcello Moores  Register   On: 09/12/2016 07:10   Dg Chest Port 1 View  Result Date: 09/11/2016 CLINICAL DATA:  Hemodialysis catheter insertion, assess tip position ; patient's electronic health record is not available at time of this interpretation EXAM: PORTABLE CHEST 1 VIEW COMPARISON:  Portable exam 1534 hours compared 09/11/2016 FINDINGS: Tip of RIGHT jugular central venous catheter projects over SVC at the level of the carina. Upper normal heart size. Slight pulmonary vascular congestion. Mediastinal contours normal. Improved pulmonary edema with persistent atelectasis versus consolidation at RIGHT lung base. Associated RIGHT pleural effusion. No pneumothorax following central line insertion. IMPRESSION: No pneumothorax following central line placement. Improved pulmonary edema with persistent atelectasis versus consolidation and pleural effusion at RIGHT lung base. Electronically Signed   By: Lavonia Dana M.D.   On: 09/11/2016 15:46   Dg Chest Port 1 View  Result Date: 09/11/2016 CLINICAL DATA:  Onset epigastric pain today.  Wheezing. EXAM: PORTABLE CHEST 1 VIEW COMPARISON:  09/09/2016 FINDINGS: Diffuse bilateral airspace disease, right greater than left, most confluent in the right lower lung. While this could reflect edema, cannot exclude pneumonia. Heart is borderline in size. Probable right effusion. IMPRESSION: Bilateral airspace disease, right greater than left, most confluent in the right base. Edema versus infection. Electronically Signed   By: Rolm Baptise M.D.   On: 09/11/2016 11:13    Dg Abd Portable 1v  Result Date: 09/10/2016 CLINICAL DATA:  Abdominal pain.  Cholecystostomy. EXAM: PORTABLE ABDOMEN - 1 VIEW COMPARISON:  CT 09/07/2016 FINDINGS: Pigtail catheter right upper quadrant in satisfactory position most likely within the gallbladder. Normal bowel gas pattern. No obstruction or ileus. Small amount of contrast or ingested tablets in the stomach. IMPRESSION: Cholecystostomy tube in good position.  Normal bowel gas pattern. Electronically Signed   By: Franchot Gallo M.D.   On: 09/10/2016 15:38    Anti-infectives: Anti-infectives    Start     Dose/Rate Route Frequency Ordered Stop   09/11/16 1000  piperacillin-tazobactam (ZOSYN) IVPB 2.25 g     2.25 g 100 mL/hr over 30 Minutes Intravenous Every 6 hours 09/11/16 0945     09/10/16 1200  cefTRIAXone (ROCEPHIN) 2 g in dextrose 5 % 50 mL IVPB  Status:  Discontinued     2 g 100 mL/hr over 30 Minutes Intravenous Every 24 hours 09/10/16 0901 09/11/16 0933   09/08/16 2000  piperacillin-tazobactam (ZOSYN) IVPB 2.25 g  Status:  Discontinued     2.25 g 100 mL/hr over 30 Minutes Intravenous Every 6 hours 09/08/16 1310 09/10/16 0901   09/06/16 1000  azithromycin (ZITHROMAX) tablet 500 mg  Status:  Discontinued     500 mg Oral Every 24 hours 09/05/16 1422 09/08/16 0831   09/06/16 0800  cefTRIAXone (ROCEPHIN) 1 g in dextrose 5 % 50 mL IVPB  Status:  Discontinued     1 g 100 mL/hr over 30 Minutes Intravenous Every 24 hours 09/05/16 1422 09/05/16 1940  09/05/16 2000  piperacillin-tazobactam (ZOSYN) IVPB 3.375 g  Status:  Discontinued     3.375 g 12.5 mL/hr over 240 Minutes Intravenous Every 8 hours 09/05/16 1948 09/08/16 1310   09/05/16 0845  cefTRIAXone (ROCEPHIN) 1 g in dextrose 5 % 50 mL IVPB     1 g 100 mL/hr over 30 Minutes Intravenous  Once 09/05/16 0836 09/05/16 0912   09/05/16 0845  azithromycin (ZITHROMAX) 500 mg in dextrose 5 % 250 mL IVPB     500 mg 250 mL/hr over 60 Minutes Intravenous  Once 09/05/16 0836 09/05/16  1017      Assessment/Plan: Cholecystitis - s/p per chole drain 1/7 - per IR needsOP follow-up in IR clinic in 5-6 weeks - CT showed no intra-abdominal source of infection; drain in place  Pneumonia RLL Atrial fibrillation Acute on chronic kidney disease stage III, h/o FSGS, has AVF in place DM HTN RA  ID - zosyn 1/4>>1/9, zithromax 1/4>>1/7, rocephin 1/9>> VTE - heparin FEN - carb modified  Plan - Continue perc drain for cholecystitis - patient improved from abdominal standpoint  LOS: 7 days    TSUEI,MATTHEW K. 09/12/2016

## 2016-09-12 NOTE — Progress Notes (Signed)
[place] for Heparin Indication: atrial fibrillation  Allergies  Allergen Reactions  . Glyburide Other (See Comments)    unknown  . Methotrexate Derivatives Other (See Comments)    Patient cannot remember    Patient Measurements: Height: '6\' 1"'$  (185.4 cm) Weight: 245 lb 9.5 oz (111.4 kg) IBW/kg (Calculated) : 79.9 Heparin Dosing Weight: 103 kg  Vital Signs: Temp: 97.9 F (36.6 C) (01/11 0000) Temp Source: Oral (01/11 0000) BP: 157/74 (01/11 0300) Pulse Rate: 74 (01/11 0300)  Labs:  Recent Labs  09/09/16 0340 09/09/16 0938 09/09/16 1547 09/09/16 2130  09/11/16 0359 09/11/16 1700 09/12/16 0228  HGB 10.6*  --   --   --   --  12.7*  --  12.1*  HCT 32.2*  --   --   --   --  36.9*  --  34.8*  PLT 180  --   --   --   --  292  --  229  HEPARINUNFRC  --   --   --   --   --   --   --  0.65  CREATININE 6.67*  --   --  7.05*  < > 7.91* 8.03* 5.37*  5.35*  TROPONINI  --  0.04* 0.15* 0.04*  --   --   --   --   < > = values in this interval not displayed.  Estimated Creatinine Clearance: 17.7 mL/min (by C-G formula based on SCr of 5.37 mg/dL (H)).    Scheduled:  . acetylcysteine  3 mL Nebulization Q6H  . aspirin EC  81 mg Oral Daily  . cholecalciferol  1,000 Units Oral Daily  . cloNIDine  0.2 mg Oral TID  . hydrALAZINE  25 mg Oral Q8H  . hydrocortisone sodium succinate  50 mg Intravenous Q6H  . insulin aspart  0-9 Units Subcutaneous Q4H  . insulin aspart  5 Units Subcutaneous TID WC  . insulin glargine  40 Units Subcutaneous QHS  . lanthanum  1,000 mg Oral TID WC  . levalbuterol  0.63 mg Nebulization TID  . pantoprazole  40 mg Oral Daily  . piperacillin-tazobactam (ZOSYN)  IV  2.25 g Intravenous Q6H  . saccharomyces boulardii  250 mg Oral BID  . senna-docusate  1 tablet Oral QHS  . sodium bicarbonate  1,300 mg Oral TID   Infusions:  . diltiazem (CARDIZEM) infusion 10 mg/hr (09/11/16 2102)  . heparin 1,600 Units/hr (09/11/16  2000)  . dialysis replacement fluid (prismasate) 600 mL/hr at 09/12/16 0203  . dialysis replacement fluid (prismasate) 400 mL/hr at 09/11/16 1648  . dialysate (PRISMASATE) 1,500 mL/hr at 09/12/16 0012    Assessment: 66yo male with new onset Afib with RVR. Pharmacy is consulted to dose heparin  -Intitial heparin level is 0.65 and at goal  Goal of Therapy:  Heparin level 0.3-0.7 units/ml Monitor platelets by anticoagulation protocol: Yes   Plan:  -No heparin changes needed -Will confirm a heparin level later today  Hildred Laser, Pharm D 09/12/2016 3:28 AM

## 2016-09-12 NOTE — Progress Notes (Signed)
Patient ID: Gary Lang, male   DOB: 14-Dec-1950, 66 y.o.   MRN: [id number] [facility] Progress Note   Assessment/ Plan:   1. 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 Started on CRRT yesterday for management of volume overload and progressive worsening of his renal function with respiratory compromise-overnight, has been able to tolerate ultrafiltration quite well and is net -3.9 L. Plan to continue CRRT at the current prescription and challenge him with intravenous furosemide this evening with a goal to discontinue CRRT tomorrow and possibly transition to intermittent hemodialysis if needed. I anticipate that with the suspected mechanism of injury (ATN associated with sepsis)- he will have good to fair recovery of his renal function. 2. Right lower lobe pneumonia/acute cholecystitis: On broad-spectrum antibiotic therapy and status post percutaneous cholecystostomy drain. No acute intra-abdominal pathology noted on CT scan yesterday-incidental note of peritoneal fluid accumulation/pleural effusion noted-likely from volume excess.  3. Hyponatremia/volume overload: Secondary to free water excretion defect associated with acute renal insufficiency. Improving with ultrafiltration on CRRT. 4. Hypertension: Blood pressures improving with ultrafiltration and hemodialysis and ongoing antibiotic agents 5. Hyperphosphatemia: On phosphorus binders. 6. Atrial flutter with rapid ventricular response: Cardiology following him, on diltiazem for rate control Subjective:   No acute events overnight after beginning CRRT and transferred to ICU earlier in the day. He reports no further pain and reports improvement of his shortness of breath.    Objective:   BP (!) 157/62   Pulse 71   Temp 97.3 F (36.3 C) (Oral)   Resp 11   Ht '6\' 1"'$  (1.854 m)   Wt 111.8 kg (246 lb 7.6 oz)   SpO2 96%   BMI 32.52 kg/m    Intake/Output Summary (Last 24 hours) at 09/12/16 1017 Last data filed at 09/12/16 1000  Gross per 24 hour  Intake           935.39 ml  Output             4856 ml  Net         -3920.61 ml   Weight change:   Physical Exam: Gen: Comfortably resting in bed-awake and interactive CVS: Pulse irregularly irregular tachycardic  Resp: Clear to auscultation, no rales Abd: Soft, obese, nontender, RUQ drain in situ Ext: Trace to 1+ lower extremity edema  Imaging: Ct Abdomen Pelvis Wo Contrast  Result Date: 09/11/2016 CLINICAL DATA:  Left lower quadrant abdominal pain. EXAM: CT ABDOMEN AND PELVIS WITHOUT CONTRAST TECHNIQUE: Multidetector CT imaging of the abdomen and pelvis was performed following the standard protocol without IV contrast. COMPARISON:  09/07/2016 FINDINGS: Lower chest: Moderate right pleural effusion, increased since prior study. Consolidation in the right lower lobe. Left base atelectasis. Heart is normal size. Hepatobiliary: Interval placement of a cholecystostomy drainage catheter into the gallbladder with decompression of the gallbladder. The subcapsular fluid collection along the inferior right hepatic surface has decreased in size since prior study. Pancreas: No focal abnormality or ductal dilatation. Spleen: No focal abnormality.  Normal size. Adrenals/Urinary Tract: Perinephric stranding around the kidneys bilaterally, stable. Left renal cyst is also stable. No hydronephrosis. Adrenal glands are unremarkable. Foley catheter is present in the bladder which is decompressed. Stomach/Bowel: Colonic diverticulosis. No active diverticulitis. Stomach and small bowel are decompressed, unremarkable. Vascular/Lymphatic: No evidence of aneurysm or adenopathy. Reproductive: No visible focal abnormality. Other: Small amount of free fluid in the pelvis and adjacent to the liver. No free air.  Large left inguinal hernia containing fat, stable. Musculoskeletal: No acute bony abnormality or focal  bone lesion. IMPRESSION: Interval placement of cholecystostomy drainage catheter into the gallbladder with decompression. The subcapsular fluid collection along the right hepatic margin has decreased in size as well. Small amount of free fluid in the pelvis and adjacent to the liver. Increasing moderate right pleural effusion with right low consolidation. Cannot exclude pneumonia. Left base atelectasis. Left inguinal hernia containing fat. Electronically Signed   By: Rolm Baptise M.D.   On: 09/11/2016 12:20   Dg Chest Port 1 View  Result Date: 09/12/2016 CLINICAL DATA:  Respiratory failure. EXAM: PORTABLE CHEST 1 VIEW COMPARISON:  09/11/2016 . FINDINGS: Right IJ line in stable position. Stable cardiomegaly. Improved pulmonary interstitial edema. Persistent right base atelectasis. Improved left mid lung field subsegmental atelectasis. Small right pleural effusion . No pneumothorax. IMPRESSION: 1. Right IJ line stable position. 2. Stable mild cardiomegaly. Improvement of pulmonary interstitial edema. Small right pleural effusion. 3. Persistent right base atelectasis. Improvement of left mid lung field atelectasis . Electronically Signed   By: Marcello Moores  Register   On: 09/12/2016 07:10   Dg Chest Port 1 View  Result Date: 09/11/2016 CLINICAL DATA:  Hemodialysis catheter insertion, assess tip position ; patient's electronic health record is not available at time of this interpretation EXAM: PORTABLE CHEST 1 VIEW COMPARISON:  Portable exam 1534 hours compared 09/11/2016 FINDINGS: Tip of RIGHT jugular central venous catheter projects over SVC at the level of the carina. Upper normal heart size. Slight pulmonary vascular congestion. Mediastinal contours normal. Improved pulmonary edema with persistent atelectasis versus consolidation at RIGHT lung base. Associated RIGHT pleural effusion. No pneumothorax following central line insertion. IMPRESSION: No pneumothorax following central line placement. Improved pulmonary  edema with persistent atelectasis versus consolidation and pleural effusion at RIGHT lung base. Electronically Signed   By: Lavonia Dana M.D.   On: 09/11/2016 15:46   Dg Chest Port 1 View  Result Date: 09/11/2016 CLINICAL DATA:  Onset epigastric pain today.  Wheezing. EXAM: PORTABLE CHEST 1 VIEW COMPARISON:  09/09/2016 FINDINGS: Diffuse bilateral airspace disease, right greater than left, most confluent in the right lower lung. While this could reflect edema, cannot exclude pneumonia. Heart is borderline in size. Probable right effusion. IMPRESSION: Bilateral airspace disease, right greater than left, most confluent in the right base. Edema versus infection. Electronically Signed   By: Rolm Baptise M.D.   On: 09/11/2016 11:13   Dg Abd Portable 1v  Result Date: 09/10/2016 CLINICAL DATA:  Abdominal pain.  Cholecystostomy. EXAM: PORTABLE ABDOMEN - 1 VIEW COMPARISON:  CT 09/07/2016 FINDINGS: Pigtail catheter right upper quadrant in satisfactory position most likely within the gallbladder. Normal bowel gas pattern. No obstruction or ileus. Small amount of contrast or ingested tablets in the stomach. IMPRESSION: Cholecystostomy tube in good position.  Normal bowel gas pattern. Electronically Signed   By: Franchot Gallo M.D.   On: 09/10/2016 15:38    Labs: BMET  Recent Labs Lab 09/08/16 1017 09/09/16 0340 09/09/16 2130 09/10/16 0356 09/11/16 0359 09/11/16 1700 09/12/16 0228  NA 140 134* 135 137 139 140 139  139  K 4.1 5.0 4.2 4.5 4.1 4.4 4.0  4.0  CL 110 106 105 106 105 106 105  105  CO2 20* 17* 16* 18* 15* 18* 22  22  GLUCOSE 102* 376* 410* 299* 205* 134* 197*  198*  BUN 51* 65* 80* 81* 102* 108* 77*  77*  CREATININE 5.95* 6.67* 7.05* 7.25* 7.91* 8.03* 5.37*  5.35*  CALCIUM 8.3* 8.0* 8.0* 8.0* 8.2* 7.8* 7.9*  7.8*  PHOS  --   --   --   --   --  6.4* 5.0*  4.9*   CBC  Recent Labs Lab 09/08/16 1017 09/09/16 0340 09/11/16 0359 09/12/16 0228  WBC 11.3* 11.4* 19.7* 20.7*  HGB  10.4* 10.6* 12.7* 12.1*  HCT 31.8* 32.2* 36.9* 34.8*  MCV 81.1 81.7 78.3 77.0*  PLT 162 180 292 229    Medications:    . acetylcysteine  3 mL Nebulization Q6H  . aspirin EC  81 mg Oral Daily  . cefTRIAXone (ROCEPHIN)  IV  2 g Intravenous Q24H  . cholecalciferol  1,000 Units Oral Daily  . cloNIDine  0.2 mg Oral TID  . furosemide  80 mg Intravenous Once  . hydrocortisone sodium succinate  50 mg Intravenous Q6H  . insulin aspart  0-9 Units Subcutaneous Q4H  . insulin aspart  5 Units Subcutaneous TID WC  . insulin glargine  40 Units Subcutaneous QHS  . lanthanum  1,000 mg Oral TID WC  . levalbuterol  0.63 mg Nebulization TID  . pantoprazole  40 mg Oral Daily  . saccharomyces boulardii  250 mg Oral BID  . senna-docusate  1 tablet Oral QHS  . sodium bicarbonate  1,300 mg Oral TID   Elmarie Shiley, MD 09/12/2016, 10:17 AM

## 2016-09-12 NOTE — Progress Notes (Signed)
PT Cancellation Note  Patient Details Name: Gary Lang MRN: [id number] DOB: [birth year]   Cancelled Treatment:    Reason Eval/Treat Not Completed: Patient not medically ready Pt started on CRRT yesterday. Per RN, line is very sensitive to movement. Plans to discontinue CRRT tomorrow and transfer to HD. Will follow up.   Gary Lang 09/12/2016, 1:09 PM Wray Kearns, [place], DPT [phone]

## 2016-09-12 NOTE — Progress Notes (Signed)
PULMONARY / CRITICAL CARE MEDICINE   Name: Gary Lang MRN: [id number] DOB: 1950/09/11    ADMISSION DATE:  09/05/2016 CONSULTATION DATE:  09/11/16  REFERRING MD:  Allyson Sabal  CHIEF COMPLAINT:  AKI  Brief:  Gary Lang is a 66 y.o. 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  He was admitted 09/05/16 with sepsis and was later found to have E.Coli bacteremia due to acute cholecystitis.  He was seen by [facility] and had percutaneous drain placed 01/09 as [facility] felt he was not stable enough for full surgery. Since admission, he has had worsening in renal function and also developed new onset paroxysmal atrial fib.  Due to worsening renal function, nephrology recommended transfer to ICU and initiation of CRRT (prefer CRRT vs HD due to issues with heart rate).  SUBJECTIVE:  Remains on CRRT and Cardizem gtt, no events overnight.   VITAL SIGNS: BP (!) 166/59 (BP Location: Right Leg)   Pulse 69   Temp 97.5 F (36.4 C) (Oral)   Resp (!) 22   Ht '6\' 1"'$  (1.854 m)   Wt 111.8 kg (246 lb 7.6 oz)   SpO2 97%   BMI 32.52 kg/m   HEMODYNAMICS:    VENTILATOR SETTINGS:    INTAKE / OUTPUT: I/O last 3 completed shifts: In: 962.4 [P.O.:240; I.V.:387.4; Other:135; IV Piggyback:200] Out: 5584 [Urine:2640; Drains:280; Other:2664]   PHYSICAL EXAMINATION: General: Middle aged male, lying in bed, no distress  Neuro: A&O x 3, follows commands, moves all extermites  HEENT: [place]/AT. PERRL, sclerae anicteric. Cardiovascular: RRR, no M/R/G, NI S1/S2 Lungs: unlabored, clear breath sounds, diminished breath sounds to right base  Abdomen: BS x 4, soft, NT/ND.  Musculoskeletal: No gross deformities, 1+ edema.  Skin: Intact, warm, no rashes.  LABS:  BMET  Recent Labs Lab 09/11/16 0359 09/11/16 1700 09/12/16 0228  NA 139 140 139  139  K 4.1 4.4 4.0  4.0  CL 105 106 105  105  CO2 15* 18* 22  22  BUN 102* 108* 77*  77*  CREATININE 7.91* 8.03*  5.37*  5.35*  GLUCOSE 205* 134* 197*  198*    Electrolytes  Recent Labs Lab 09/07/16 1208  09/09/16 0938  09/11/16 0359 09/11/16 1700 09/12/16 0228  CALCIUM  --   < >  --   < > 8.2* 7.8* 7.9*  7.8*  MG 2.1  --  2.3  --   --   --  2.5*  PHOS  --   --   --   --   --  6.4* 5.0*  4.9*  < > = values in this interval not displayed.  CBC  Recent Labs Lab 09/09/16 0340 09/11/16 0359 09/12/16 0228  WBC 11.4* 19.7* 20.7*  HGB 10.6* 12.7* 12.1*  HCT 32.2* 36.9* 34.8*  PLT 180 292 229    Coag's  Recent Labs Lab 09/08/16 1017  INR 1.11    Sepsis Markers  Recent Labs Lab 09/05/16 1010  LATICACIDVEN 0.99    ABG  Recent Labs Lab 09/11/16 1627  PHART 7.402  PCO2ART 26.5*  PO2ART 77.0*    Liver Enzymes  Recent Labs Lab 09/09/16 0340 09/10/16 0356 09/11/16 1700 09/12/16 0228  AST 57* 30  --  21  ALT 47 39  --  30  ALKPHOS 128* 111  --  95  BILITOT 1.2 1.1  --  1.6*  ALBUMIN 1.7* 1.6* 1.7* 1.8*  1.7*    Cardiac Enzymes  Recent Labs Lab 09/09/16 0938 09/09/16 1547 09/09/16 2130  TROPONINI 0.04* 0.15* 0.04*    Glucose  Recent Labs Lab 09/11/16 1304 09/11/16 1625 09/11/16 1939 09/11/16 2348 09/12/16 0352 09/12/16 0813  GLUCAP 140* 111* 162* 202* 187* 157*    Imaging Ct Abdomen Pelvis Wo Contrast  Result Date: 09/11/2016 CLINICAL DATA:  Left lower quadrant abdominal pain. EXAM: CT ABDOMEN AND PELVIS WITHOUT CONTRAST TECHNIQUE: Multidetector CT imaging of the abdomen and pelvis was performed following the standard protocol without IV contrast. COMPARISON:  09/07/2016 FINDINGS: Lower chest: Moderate right pleural effusion, increased since prior study. Consolidation in the right lower lobe. Left base atelectasis. Heart is normal size. Hepatobiliary: Interval placement of a cholecystostomy drainage catheter into the gallbladder with decompression of the gallbladder. The subcapsular fluid collection along the inferior right hepatic surface has  decreased in size since prior study. Pancreas: No focal abnormality or ductal dilatation. Spleen: No focal abnormality.  Normal size. Adrenals/Urinary Tract: Perinephric stranding around the kidneys bilaterally, stable. Left renal cyst is also stable. No hydronephrosis. Adrenal glands are unremarkable. Foley catheter is present in the bladder which is decompressed. Stomach/Bowel: Colonic diverticulosis. No active diverticulitis. Stomach and small bowel are decompressed, unremarkable. Vascular/Lymphatic: No evidence of aneurysm or adenopathy. Reproductive: No visible focal abnormality. Other: Small amount of free fluid in the pelvis and adjacent to the liver. No free air. Large left inguinal hernia containing fat, stable. Musculoskeletal: No acute bony abnormality or focal bone lesion. IMPRESSION: Interval placement of cholecystostomy drainage catheter into the gallbladder with decompression. The subcapsular fluid collection along the right hepatic margin has decreased in size as well. Small amount of free fluid in the pelvis and adjacent to the liver. Increasing moderate right pleural effusion with right low consolidation. Cannot exclude pneumonia. Left base atelectasis. Left inguinal hernia containing fat. Electronically Signed   By: Rolm Baptise M.D.   On: 09/11/2016 12:20   Dg Chest Port 1 View  Result Date: 09/12/2016 CLINICAL DATA:  Respiratory failure. EXAM: PORTABLE CHEST 1 VIEW COMPARISON:  09/11/2016 . FINDINGS: Right IJ line in stable position. Stable cardiomegaly. Improved pulmonary interstitial edema. Persistent right base atelectasis. Improved left mid lung field subsegmental atelectasis. Small right pleural effusion . No pneumothorax. IMPRESSION: 1. Right IJ line stable position. 2. Stable mild cardiomegaly. Improvement of pulmonary interstitial edema. Small right pleural effusion. 3. Persistent right base atelectasis. Improvement of left mid lung field atelectasis . Electronically Signed   By:  Marcello Moores  Register   On: 09/12/2016 07:10   Dg Chest Port 1 View  Result Date: 09/11/2016 CLINICAL DATA:  Hemodialysis catheter insertion, assess tip position ; patient's electronic health record is not available at time of this interpretation EXAM: PORTABLE CHEST 1 VIEW COMPARISON:  Portable exam 1534 hours compared 09/11/2016 FINDINGS: Tip of RIGHT jugular central venous catheter projects over SVC at the level of the carina. Upper normal heart size. Slight pulmonary vascular congestion. Mediastinal contours normal. Improved pulmonary edema with persistent atelectasis versus consolidation at RIGHT lung base. Associated RIGHT pleural effusion. No pneumothorax following central line insertion. IMPRESSION: No pneumothorax following central line placement. Improved pulmonary edema with persistent atelectasis versus consolidation and pleural effusion at RIGHT lung base. Electronically Signed   By: Lavonia Dana M.D.   On: 09/11/2016 15:46   Dg Chest Port 1 View  Result Date: 09/11/2016 CLINICAL DATA:  Onset epigastric pain today.  Wheezing. EXAM: PORTABLE CHEST 1 VIEW COMPARISON:  09/09/2016 FINDINGS: Diffuse bilateral airspace disease, right greater than left, most confluent  in the right lower lung. While this could reflect edema, cannot exclude pneumonia. Heart is borderline in size. Probable right effusion. IMPRESSION: Bilateral airspace disease, right greater than left, most confluent in the right base. Edema versus infection. Electronically Signed   By: Rolm Baptise M.D.   On: 09/11/2016 11:13     STUDIES:  Echo 01/08 > EF 65-70%, G1DD, PAP 40.  CULTURES: Blood 01/04 > E.Coli. Gallbladder fluid 01/07 > E.Coli, K.Pneumoniae.  ANTIBIOTICS: Azithro 01/04 > 01/10 Ceftriaxone 01/04 > 01/05 Zosyn 01/04 >  SIGNIFICANT EVENTS: 01/04 > admit. 1/10- cvvhd, fib rvr  LINES/TUBES: RIJ Vascath 1/11 >   DISCUSSION: 66 y.o. male admitted 01/04 with sepsis, found to have E.coli bacteremia from acute  cholecystitis.  Has had worsening renal function since admit (baseline CKD III due to FSGS); therefore, transferred to ICU 01/10 for initiation of CRRT.  ASSESSMENT / PLAN:  RENAL A:   Acute on CKD III due to FSGS - now with worsening renal failure since admission. AGMA - renal failure. P:  Continue Sodium bicarb CRRT per renal  BMP per renal, neg balance  CARDIOVASCULAR A:  New onset PAF - back to NSR AM 01/10 then developed AFRVR that afternoon which did not respond to dilt + metop. Hx HTN. P:  Continue Cardizem gtt x 24 hours further then re attempt oral load aggressive short active 60 q6h po in am  Continue heparin gtt Cardiology following, appreciate the assistance, will discuss plan above Hydralazine PRN- dc as cause reflext tachy  metoprolol PRN. Continue ASA, clonidine, hydralazine   INFECTIOUS A:   E.Coli bacteremia - likely due to acute cholecystitis. kleib in gallbaldder also P:   Narrow to ceftraixone  PULMONARY A: Acute hypoxic respiratory failure. Concern for CAP. Atelectasis rt base P:   Chest pt mucomysts x 2 more doses IS pcxr in am  Volume removal  GASTROINTESTINAL A:   Nutrition. P:   Carb mod diet. PPI   HEMATOLOGIC A:   VTE Prophylaxis. P:  SCD's / heparin gtt. CBC in AM.  ENDOCRINE A:   DM. controlled P:   SSI, lantus. Glucose checks q4H   NEUROLOGIC A:   No acute issues. P:   No interventions required.  AUTOIMMUNE A: Hx RA - on pred. P: Solu-Cortef '50mg'$  q6h  Family updated: Patient updated on plan 1/11  Interdisciplinary Family Meeting v Palliative Care Meeting:  Due by: 09/18/16.   [name], AG-ACNP [facility]  Pgr: [phone]  PCCM Pgr: [phone]  STAFF NOTE: Linwood Dibbles, MD FACP have personally reviewed patient's available data, including medical history, events of note, physical examination and test results as part of my evaluation. I have discussed with  resident/NP and other care providers such as pharmacist, RN and RRT. In addition, I personally evaluated patient and elicited key findings of: awake, alert, no distress, appears better, no sob, crackles coarse improved bases, pcxr remains with atx rt base, chest pt and mucomysts to remain, pcxr in am , IS, neg 4 liters maintain , will transition OFF caardizem drip in am after furtehr neg balance, maintain diet, narrow off zosyn to ceftriaxone will cover both gram neg orgaism, lasix per renal in PM, I updated pt in full The patient is critically ill with multiple organ systems failure and requires high complexity decision making for assessment and support, frequent evaluation and titration of therapies, application of advanced monitoring technologies and extensive interpretation of multiple databases.   Critical Care Time devoted to  patient care services described in this note is 30  Minutes. This time reflects time of care of this signee: Merrie Roof, MD FACP. This critical care time does not reflect procedure time, or teaching time or supervisory time of PA/NP/Med student/Med Resident etc but could involve care discussion time. Rest per NP/medical resident whose note is outlined above and that I agree with   [name]. Titus Mould, MD, [place] Pgr: [facility] 09/12/2016 10:07 AM

## 2016-09-12 NOTE — Progress Notes (Signed)
PROGRESS NOTE    Gary Lang  IRC:[id number] DOB: Apr 08, 1951 DOA: 09/05/2016 PCP: [name], MD   Brief Narrative: 66 y.o. male with medical history significant of chronic kidney disease, diabetes, hypertension, rheumatoid arthritis, ex-smoker quit about 20 years ago presented with fever up to 102 at home associated with chills and right-sided chest pain.Patient with RUQ pain and nausea. CT imaging consistent with acute cholecystitis.  s/p per chole drain 1/7. Now with worsening renal function, recurrent A. fib, acute hypoxic respiratory failure   Assessment & Plan:   Active Problems:   Community acquired pneumonia of right lower lobe of lung ([place])   Acute respiratory failure with hypoxia ([place])   Acute cholecystitis due to biliary calculus   AKI (acute kidney injury) ([place])   Abdominal distention, non-gaseous   Acute abdominal pain    #Sepsis due to E Coli bacteremia and possibly commonly acquired pneumonia/acute cholecystitis: Chest x-ray showed right lower lobe pneumonia. Patient with tachycardia, leukocytosis, fever and abnormal x-ray finding on admission -2 out of 2 blood cultures growing gram-negative rods consistent with Escherichia coli. Follow up final culture and sensitivity. Patient's white blood cell count has increased, will transition back to Zosyn and discontinue Rocephin. Also start patient on probiotics Recent CT scan of abdomen and pelvis with no acute finding.  source of Escherichia coli bacteremia likely gallbladder. Abdomen more distended, last 2 days -Respiratory viral studies negative.   CT abdomen pelvis  1/10  showed increasing moderate right pleural effusion possible right lower lobe consolidation Patient also on steroids for respiratory failure, leukocytosis could be related to Steroids?  As per surgery recommendations Donnie Mesa, MD patient is S/p perc cholecystostomy 09/08/16.per IR needsOP follow-up in IR clinic in 5-6 weeks After he has fully recovered  from his pneumonia,   we can consider elective laparoscopic cholecystectomy       #Acute hypoxic respiratory failure likely in the setting of pneumonia and sepsis:  Currently requiring 2 L of oxygen.   Continue treatment for pneumonia with Zosyn. Continue to monitor.  CT chest shows multifocal atelectasis, possible right lower lobe pneumonia. 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 Acute renal insufficiency appears to be associated with sepsis/ATN. Overnight, excellent response to furosemide with some increase in his creatinine- has been increasing Preadmission creatinine 1.25. 1/10, creatinine was 7.91 Now started on CRRT . Not a candidate for hemodialysis due to atrial flutter/tachycardia  Status post insertion of hemodialysis catheter -Patient required hemodialysis in the past and he has left upper extremity AV fistula. Now transferred to ICU for CRRT   #Elevated troponin likely in the setting of sepsis: Trend cardiac enzymes in the setting of new onset atrial fibrillation -Echocardiogram showed vigorous left ventricular systolic function with EF of 65-30%. There were no regional wall motion abnormalities. Also with  grade 1 diastolic dysfunction. Pt has been in and out of atrial fibrillation with RVR. Attempts to control with oral diltiazem 120 mg long acting and additional short acting 30 mg plus 10 mg IV bolus and metorpolol 5 mg IV have not been successful at maintaining SR or controlling rate.    #Hyperkalemia: Resolved after receiving 1 dose of Kayexalate. Now CRRT   New-onset A fib? Back in NSR spontaneously Cardiology has placed patient on IV heparin   and IV Cardizem drip  Patient failed transition to by mouth Cardizem No plans for inpatient  ischemic w/u at this time Cardiology  Recommends initiation of heparin, per pharmacy, eventually  Coumadin  Uncontrolled diabetes Was placed on insulin drip yesterday primarily for stabilizing glucose, did not suspect dka , bicarbonate is low secondary to acute kidney injury Continue to decrease steroids Now transition to Lantus, sliding scale insulin   DVT prophylaxis: Heparin subcutaneous Code Status: Full code Family Communication: No family present at bedside Disposition Plan: Continue ICU, CRRT    Consultants:   Nephrology  Cardiology    Procedures: None Zosyn  1/9-Rocephin  Subjective:  Patient was in atrial fibrillation last night for which she was given IV Cardizem   Objective: Vitals:   09/12/16 0500 09/12/16 0600 09/12/16 0700 09/12/16 0800  BP: (!) 145/71 (!) 144/68 (!) 150/62 (!) 166/59  Pulse: 75 83 72 69  Resp: '11 16 12 '$ (!) 22  Temp:      TempSrc:      SpO2: 96% 93% 97% 97%  Weight: 111.8 kg (246 lb 7.6 oz)     Height:        Intake/Output Summary (Last 24 hours) at 09/12/16 0818 Last data filed at 09/12/16 0800  Gross per 24 hour  Intake           873.39 ml  Output             4396 ml  Net         -3522.61 ml   Filed Weights   09/05/16 1442 09/07/16 1459 09/12/16 0500  Weight: 102.1 kg (225 lb) 111.4 kg (245 lb 9.5 oz) 111.8 kg (246 lb 7.6 oz)    Examination:  General exam: Ill-looking male, lying in bed, not in distress Respiratory system: Clear bilaterally except mild intermittent wheeze, speaking full sentences. Cardiovascular system: S1 and S2 normal. Regular rate rhythm. No pedal edema. Gastrointestinal system: Abdomen soft, nontender, nondistended. Bowel sound positive.  Central nervous system: Alert and oriented. No focal neurological deficits. Extremities: Symmetric 5 x 5 power. Skin: No rashes, lesions or ulcers Psychiatry: Judgement and insight appear normal. Mood & affect appropriate.     Data Reviewed: I have personally reviewed following labs and imaging studies  CBC:  Recent Labs Lab 09/07/16 0719  09/08/16 1017 09/09/16 0340 09/11/16 0359 09/12/16 0228  WBC 11.4* 11.3* 11.4* 19.7* 20.7*  HGB 11.4* 10.4* 10.6* 12.7* 12.1*  HCT 35.3* 31.8* 32.2* 36.9* 34.8*  MCV 83.8 81.1 81.7 78.3 77.0*  PLT 130* 162 180 292 001   Basic Metabolic Panel:  Recent Labs Lab 09/06/16 0621  09/07/16 1208  09/09/16 0938 09/09/16 2130 09/10/16 0356 09/11/16 0359 09/11/16 1700 09/12/16 0228  NA 139  < >  --   < >  --  135 137 139 140 139  139  K 4.5  < >  --   < >  --  4.2 4.5 4.1 4.4 4.0  4.0  CL 109  < >  --   < >  --  105 106 105 106 105  105  CO2 21*  < >  --   < >  --  16* 18* 15* 18* 22  22  GLUCOSE 245*  < >  --   < >  --  410* 299* 205* 134* 197*  198*  BUN 29*  < >  --   < >  --  80* 81* 102* 108* 77*  77*  CREATININE 2.67*  < >  --   < >  --  7.05* 7.25* 7.91* 8.03*  5.37*  5.35*  CALCIUM 7.8*  < >  --   < >  --  8.0* 8.0* 8.2* 7.8* 7.9*  7.8*  MG 1.7  --  2.1  --  2.3  --   --   --   --  2.5*  PHOS  --   --   --   --   --   --   --   --  6.4* 5.0*  4.9*  < > = values in this interval not displayed. GFR: Estimated Creatinine Clearance: 17.7 mL/min (by C-G formula based on SCr of 5.37 mg/dL (H)). Liver Function Tests:  Recent Labs Lab 09/07/16 1638 09/09/16 0340 09/10/16 0356 09/11/16 1700 09/12/16 0228  AST 64* 57* 30  --  21  ALT 45 47 39  --  30  ALKPHOS 107 128* 111  --  95  BILITOT 2.2* 1.2 1.1  --  1.6*  PROT 6.3* 6.4* 6.2*  --  6.4*  ALBUMIN 2.1* 1.7* 1.6* 1.7* 1.8*  1.7*    Recent Labs Lab 09/07/16 1638 09/08/16 1017  LIPASE 77* 71*   No results for input(s): AMMONIA in the last 168 hours. Coagulation Profile:  Recent Labs Lab 09/08/16 1017  INR 1.11   Cardiac Enzymes:  Recent Labs Lab 09/05/16 1517 09/05/16 2023 09/09/16 0938 09/09/16 1547 09/09/16 2130  TROPONINI 0.05* 0.05* 0.04* 0.15* 0.04*   BNP (last 3 results) No results for input(s): PROBNP in the last 8760 hours. HbA1C: No results for input(s): HGBA1C in the last 72  hours. CBG:  Recent Labs Lab 09/11/16 1625 09/11/16 1939 09/11/16 2348 09/12/16 0352 09/12/16 0813  GLUCAP 111* 162* 202* 187* 157*   Lipid Profile: No results for input(s): CHOL, HDL, LDLCALC, TRIG, CHOLHDL, LDLDIRECT in the last 72 hours. Thyroid Function Tests:  Recent Labs  09/09/16 0938  TSH 0.457  FREET4 0.76   Anemia Panel: No results for input(s): VITAMINB12, FOLATE, FERRITIN, TIBC, IRON, RETICCTPCT in the last 72 hours. Sepsis Labs:  Recent Labs Lab 09/05/16 1010  LATICACIDVEN 0.99    Recent Results (from the past 240 hour(s))  Urine culture     Status: Abnormal   Collection Time: 09/03/16 12:36 AM  Result Value Ref Range Status   Specimen Description URINE, CLEAN CATCH  Final   Special Requests NONE  Final   Culture (A)  Final    <10,000 COLONIES/mL INSIGNIFICANT GROWTH Performed at [facility]    Report Status 09/04/2016 FINAL  Final  Blood Culture (routine x 2)     Status: Abnormal   Collection Time: 09/05/16  7:21 AM  Result Value Ref Range Status   Specimen Description BLOOD RIGHT FOREARM  Final   Special Requests BOTTLES DRAWN AEROBIC AND ANAEROBIC [employer]  Final   Culture  Setup Time   Final    Performed at [facility] IN BOTH AEROBIC AND ANAEROBIC BOTTLES Gram Stain Report Called to,Read Back By and Verified With: STONE,B AT 1930 ON 09/05/2016 BY ISLEY,B CRITICAL RESULT CALLED TO, READ BACK BY AND VERIFIED WITH: N LIGHTNER,RN '@[id number]'$  09/06/16 MKELLY,MLT Performed at [place] (A)  Final   Report Status 09/08/2016 FINAL  Final   Organism ID, Bacteria ESCHERICHIA COLI  Final      Susceptibility   Escherichia coli - MIC*    AMPICILLIN >=32 RESISTANT Resistant     CEFAZOLIN <=4 SENSITIVE Sensitive     CEFEPIME <=1 SENSITIVE Sensitive  CEFTAZIDIME <=1 SENSITIVE Sensitive     CEFTRIAXONE <=1 SENSITIVE Sensitive     CIPROFLOXACIN <=0.25 SENSITIVE Sensitive     GENTAMICIN <=1  SENSITIVE Sensitive     IMIPENEM <=0.25 SENSITIVE Sensitive     TRIMETH/SULFA <=20 SENSITIVE Sensitive     AMPICILLIN/SULBACTAM 16 INTERMEDIATE Intermediate     PIP/TAZO <=4 SENSITIVE Sensitive     Extended ESBL NEGATIVE Sensitive     * ESCHERICHIA COLI  Blood Culture ID Panel (Reflexed)     Status: Abnormal   Collection Time: 09/05/16  7:21 AM  Result Value Ref Range Status   Enterococcus species NOT DETECTED NOT DETECTED Final   Listeria monocytogenes NOT DETECTED NOT DETECTED Final   Staphylococcus species NOT DETECTED NOT DETECTED Final   Staphylococcus aureus NOT DETECTED NOT DETECTED Final   Streptococcus species NOT DETECTED NOT DETECTED Final   Streptococcus agalactiae NOT DETECTED NOT DETECTED Final   Streptococcus pneumoniae NOT DETECTED NOT DETECTED Final   Streptococcus pyogenes NOT DETECTED NOT DETECTED Final   Acinetobacter baumannii NOT DETECTED NOT DETECTED Final   Enterobacteriaceae species DETECTED (A) NOT DETECTED Final    Comment: CRITICAL RESULT CALLED TO, READ BACK BY AND VERIFIED WITH: N LIGHTNER,RN '@0312'$  09/06/16 MKELLY,MLT    Enterobacter cloacae complex NOT DETECTED NOT DETECTED Final   Escherichia coli DETECTED (A) NOT DETECTED Final    Comment: CRITICAL RESULT CALLED TO, READ BACK BY AND VERIFIED WITH: N LIGHTNER,RN '@0312'$  09/06/16 MKELLY,MLT    Klebsiella oxytoca NOT DETECTED NOT DETECTED Final   Klebsiella pneumoniae NOT DETECTED NOT DETECTED Final   Proteus species NOT DETECTED NOT DETECTED Final   Serratia marcescens NOT DETECTED NOT DETECTED Final   Carbapenem resistance NOT DETECTED NOT DETECTED Final   Haemophilus influenzae NOT DETECTED NOT DETECTED Final   Neisseria meningitidis NOT DETECTED NOT DETECTED Final   Pseudomonas aeruginosa NOT DETECTED NOT DETECTED Final   Candida albicans NOT DETECTED NOT DETECTED Final   Candida glabrata NOT DETECTED NOT DETECTED Final   Candida krusei NOT DETECTED NOT DETECTED Final   Candida parapsilosis NOT  DETECTED NOT DETECTED Final   Candida tropicalis NOT DETECTED NOT DETECTED Final    Comment: Performed at [facility]  Blood Culture (routine x 2)     Status: Abnormal   Collection Time: 09/05/16  7:25 AM  Result Value Ref Range Status   Specimen Description BLOOD RIGHT HAND  Final   Special Requests BOTTLES DRAWN AEROBIC AND ANAEROBIC 8CC  Final   Culture  Setup Time   Final    Performed at [place] IN BOTH AEROBIC AND ANAEROBIC BOTTLES Gram Stain Report Called to,Read Back By and Verified With: STONE,B AT 1930 ON 09/05/2016 BY ISLEY,B    Culture (A)  Final    ESCHERICHIA COLI SUSCEPTIBILITIES PERFORMED ON PREVIOUS CULTURE WITHIN THE LAST 5 DAYS. Performed at [facility]    Report Status 09/08/2016 FINAL  Final  Urine culture     Status: Abnormal   Collection Time: 09/05/16  1:10 PM  Result Value Ref Range Status   Specimen Description URINE, RANDOM  Final   Special Requests NONE  Final   Culture MULTIPLE SPECIES PRESENT, SUGGEST RECOLLECTION (A)  Final   Report Status 09/07/2016 FINAL  Final  Respiratory Panel by PCR     Status: None   Collection Time: 09/05/16  2:22 PM  Result Value Ref Range Status   Adenovirus NOT DETECTED NOT DETECTED Final   Coronavirus 229E NOT DETECTED  NOT DETECTED Final   Coronavirus HKU1 NOT DETECTED NOT DETECTED Final   Coronavirus NL63 NOT DETECTED NOT DETECTED Final   Coronavirus OC43 NOT DETECTED NOT DETECTED Final   Metapneumovirus NOT DETECTED NOT DETECTED Final   Rhinovirus / Enterovirus NOT DETECTED NOT DETECTED Final   Influenza A NOT DETECTED NOT DETECTED Final   Influenza B NOT DETECTED NOT DETECTED Final   Parainfluenza Virus 1 NOT DETECTED NOT DETECTED Final   Parainfluenza Virus 2 NOT DETECTED NOT DETECTED Final   Parainfluenza Virus 3 NOT DETECTED NOT DETECTED Final   Parainfluenza Virus 4 NOT DETECTED NOT DETECTED Final   Respiratory Syncytial Virus NOT DETECTED NOT DETECTED Final    Bordetella pertussis NOT DETECTED NOT DETECTED Final   Chlamydophila pneumoniae NOT DETECTED NOT DETECTED Final   Mycoplasma pneumoniae NOT DETECTED NOT DETECTED Final    Comment: Performed at [facility]  Culture, blood (routine x 2)     Status: None (Preliminary result)   Collection Time: 09/07/16 11:58 AM  Result Value Ref Range Status   Specimen Description BLOOD RIGHT WRIST  Final   Special Requests BOTTLES DRAWN AEROBIC AND ANAEROBIC [place]  Final   Culture NO GROWTH 4 DAYS  Final   Report Status PENDING  Incomplete  Culture, blood (routine x 2)     Status: None (Preliminary result)   Collection Time: 09/07/16 12:08 PM  Result Value Ref Range Status   Specimen Description BLOOD RIGHT HAND  Final   Special Requests BOTTLES DRAWN AEROBIC ONLY 4CC ONLY  Final   Culture NO GROWTH 4 DAYS  Final   Report Status PENDING  Incomplete  Aerobic/Anaerobic Culture (surgical/deep wound)     Status: None (Preliminary result)   Collection Time: 09/08/16 12:11 PM  Result Value Ref Range Status   Specimen Description ABSCESS GALL BLADDER  Final   Special Requests Normal  Final   Gram Stain   Final    RARE WBC PRESENT,BOTH PMN AND MONONUCLEAR NO ORGANISMS SEEN    Culture   Final    MODERATE ESCHERICHIA COLI MODERATE KLEBSIELLA PNEUMONIAE NO ANAEROBES ISOLATED; CULTURE IN PROGRESS FOR 5 DAYS    Report Status PENDING  Incomplete   Organism ID, Bacteria ESCHERICHIA COLI  Final   Organism ID, Bacteria KLEBSIELLA PNEUMONIAE  Final      Susceptibility   Escherichia coli - MIC*    AMPICILLIN >=32 RESISTANT Resistant     CEFAZOLIN <=4 SENSITIVE Sensitive     CEFEPIME <=1 SENSITIVE Sensitive     CEFTAZIDIME <=1 SENSITIVE Sensitive     CEFTRIAXONE <=1 SENSITIVE Sensitive     CIPROFLOXACIN <=0.25 SENSITIVE Sensitive     GENTAMICIN <=1 SENSITIVE Sensitive     IMIPENEM <=0.25 SENSITIVE Sensitive     TRIMETH/SULFA <=20 SENSITIVE Sensitive     AMPICILLIN/SULBACTAM 16 INTERMEDIATE  Intermediate     PIP/TAZO <=4 SENSITIVE Sensitive     Extended ESBL NEGATIVE Sensitive     * MODERATE ESCHERICHIA COLI   Klebsiella pneumoniae - MIC*    AMPICILLIN 16 RESISTANT Resistant     CEFAZOLIN <=4 SENSITIVE Sensitive     CEFEPIME <=1 SENSITIVE Sensitive     CEFTAZIDIME <=1 SENSITIVE Sensitive     CEFTRIAXONE <=1 SENSITIVE Sensitive     CIPROFLOXACIN <=0.25 SENSITIVE Sensitive     GENTAMICIN <=1 SENSITIVE Sensitive     IMIPENEM <=0.25 SENSITIVE Sensitive     TRIMETH/SULFA <=20 SENSITIVE Sensitive     AMPICILLIN/SULBACTAM <=2 SENSITIVE Sensitive     PIP/TAZO <=  4 SENSITIVE Sensitive     Extended ESBL NEGATIVE Sensitive     * MODERATE KLEBSIELLA PNEUMONIAE  MRSA PCR Screening     Status: None   Collection Time: 09/09/16  4:49 PM  Result Value Ref Range Status   MRSA by PCR NEGATIVE NEGATIVE Final    Comment:        The GeneXpert MRSA Assay (FDA approved for NASAL specimens only), is one component of a comprehensive MRSA colonization surveillance program. It is not intended to diagnose MRSA infection nor to guide or monitor treatment for MRSA infections.          Radiology Studies: Ct Abdomen Pelvis Wo Contrast  Result Date: 09/11/2016 CLINICAL DATA:  Left lower quadrant abdominal pain. EXAM: CT ABDOMEN AND PELVIS WITHOUT CONTRAST TECHNIQUE: Multidetector CT imaging of the abdomen and pelvis was performed following the standard protocol without IV contrast. COMPARISON:  09/07/2016 FINDINGS: Lower chest: Moderate right pleural effusion, increased since prior study. Consolidation in the right lower lobe. Left base atelectasis. Heart is normal size. Hepatobiliary: Interval placement of a cholecystostomy drainage catheter into the gallbladder with decompression of the gallbladder. The subcapsular fluid collection along the inferior right hepatic surface has decreased in size since prior study. Pancreas: No focal abnormality or ductal dilatation. Spleen: No focal abnormality.   Normal size. Adrenals/Urinary Tract: Perinephric stranding around the kidneys bilaterally, stable. Left renal cyst is also stable. No hydronephrosis. Adrenal glands are unremarkable. Foley catheter is present in the bladder which is decompressed. Stomach/Bowel: Colonic diverticulosis. No active diverticulitis. Stomach and small bowel are decompressed, unremarkable. Vascular/Lymphatic: No evidence of aneurysm or adenopathy. Reproductive: No visible focal abnormality. Other: Small amount of free fluid in the pelvis and adjacent to the liver. No free air. Large left inguinal hernia containing fat, stable. Musculoskeletal: No acute bony abnormality or focal bone lesion. IMPRESSION: Interval placement of cholecystostomy drainage catheter into the gallbladder with decompression. The subcapsular fluid collection along the right hepatic margin has decreased in size as well. Small amount of free fluid in the pelvis and adjacent to the liver. Increasing moderate right pleural effusion with right low consolidation. Cannot exclude pneumonia. Left base atelectasis. Left inguinal hernia containing fat. Electronically Signed   By: Rolm Baptise M.D.   On: 09/11/2016 12:20   Dg Chest Port 1 View  Result Date: 09/12/2016 CLINICAL DATA:  Respiratory failure. EXAM: PORTABLE CHEST 1 VIEW COMPARISON:  09/11/2016 . FINDINGS: Right IJ line in stable position. Stable cardiomegaly. Improved pulmonary interstitial edema. Persistent right base atelectasis. Improved left mid lung field subsegmental atelectasis. Small right pleural effusion . No pneumothorax. IMPRESSION: 1. Right IJ line stable position. 2. Stable mild cardiomegaly. Improvement of pulmonary interstitial edema. Small right pleural effusion. 3. Persistent right base atelectasis. Improvement of left mid lung field atelectasis . Electronically Signed   By: Marcello Moores  Register   On: 09/12/2016 07:10   Dg Chest Port 1 View  Result Date: 09/11/2016 CLINICAL DATA:  Hemodialysis  catheter insertion, assess tip position ; patient's electronic health record is not available at time of this interpretation EXAM: PORTABLE CHEST 1 VIEW COMPARISON:  Portable exam 1534 hours compared 09/11/2016 FINDINGS: Tip of RIGHT jugular central venous catheter projects over SVC at the level of the carina. Upper normal heart size. Slight pulmonary vascular congestion. Mediastinal contours normal. Improved pulmonary edema with persistent atelectasis versus consolidation at RIGHT lung base. Associated RIGHT pleural effusion. No pneumothorax following central line insertion. IMPRESSION: No pneumothorax following central line placement. Improved pulmonary edema with  persistent atelectasis versus consolidation and pleural effusion at RIGHT lung base. Electronically Signed   By: Lavonia Dana M.D.   On: 09/11/2016 15:46   Dg Chest Port 1 View  Result Date: 09/11/2016 CLINICAL DATA:  Onset epigastric pain today.  Wheezing. EXAM: PORTABLE CHEST 1 VIEW COMPARISON:  09/09/2016 FINDINGS: Diffuse bilateral airspace disease, right greater than left, most confluent in the right lower lung. While this could reflect edema, cannot exclude pneumonia. Heart is borderline in size. Probable right effusion. IMPRESSION: Bilateral airspace disease, right greater than left, most confluent in the right base. Edema versus infection. Electronically Signed   By: Rolm Baptise M.D.   On: 09/11/2016 11:13   Dg Abd Portable 1v  Result Date: 09/10/2016 CLINICAL DATA:  Abdominal pain.  Cholecystostomy. EXAM: PORTABLE ABDOMEN - 1 VIEW COMPARISON:  CT 09/07/2016 FINDINGS: Pigtail catheter right upper quadrant in satisfactory position most likely within the gallbladder. Normal bowel gas pattern. No obstruction or ileus. Small amount of contrast or ingested tablets in the stomach. IMPRESSION: Cholecystostomy tube in good position.  Normal bowel gas pattern. Electronically Signed   By: Franchot Gallo M.D.   On: 09/10/2016 15:38         Scheduled Meds: . acetylcysteine  3 mL Nebulization Q6H  . aspirin EC  81 mg Oral Daily  . cholecalciferol  1,000 Units Oral Daily  . cloNIDine  0.2 mg Oral TID  . hydrALAZINE  25 mg Oral Q8H  . hydrocortisone sodium succinate  50 mg Intravenous Q6H  . insulin aspart  0-9 Units Subcutaneous Q4H  . insulin aspart  5 Units Subcutaneous TID WC  . insulin glargine  40 Units Subcutaneous QHS  . lanthanum  1,000 mg Oral TID WC  . levalbuterol  0.63 mg Nebulization TID  . pantoprazole  40 mg Oral Daily  . piperacillin-tazobactam (ZOSYN)  IV  2.25 g Intravenous Q6H  . saccharomyces boulardii  250 mg Oral BID  . senna-docusate  1 tablet Oral QHS  . sodium bicarbonate  1,300 mg Oral TID   Continuous Infusions: . diltiazem (CARDIZEM) infusion 10 mg/hr (09/12/16 0811)  . heparin 1,600 Units/hr (09/12/16 0406)  . dialysis replacement fluid (prismasate) 600 mL/hr at 09/12/16 0611  . dialysis replacement fluid (prismasate) 400 mL/hr at 09/11/16 1648  . dialysate (PRISMASATE) 1,500 mL/hr at 09/12/16 0714     LOS: 7 days    Reyne Dumas, MD [facility] Pager [phone]  If 7PM-7AM, please contact night-coverage [email or web address] Password [facility] 09/12/2016, 8:18 AM

## 2016-09-12 NOTE — Progress Notes (Signed)
Patient Name: Gary Lang Date of Encounter: 09/12/2016  Primary Cardiologist: Dr. [facility] Problem List     Active Problems:   Community acquired pneumonia of right lower lobe of lung ([place])   Acute respiratory failure with hypoxia ([place])   Acute cholecystitis due to biliary calculus   AKI (acute kidney injury) ([place])   Abdominal distention, non-gaseous   Acute abdominal pain    Patient Profile     66 y/o male with h/o CKD, RA, DM and HTN, who presented on 09/05/16 with complaint of right sided chest and RUQ abdominal pain, fever and chills, found to have right sided PNA and acute cholecystitis, now s/p percutaneous cholecystostomy. Also being treated for Sepsis due to E coli bacteremia. Hospitalization complicated by new onset atrial fibrillation.    Subjective   Pt seen on CRRT. Denies chest pain and is breathing without difficulty. Is much more comfortable today.  Inpatient Medications    Scheduled Meds: . acetylcysteine  3 mL Nebulization Q6H  . aspirin EC  81 mg Oral Daily  . cefTRIAXone (ROCEPHIN)  IV  2 g Intravenous Q24H  . cholecalciferol  1,000 Units Oral Daily  . cloNIDine  0.2 mg Oral TID  . diltiazem  240 mg Oral Daily  . furosemide  80 mg Intravenous Once  . hydrocortisone sodium succinate  50 mg Intravenous Q6H  . insulin aspart  0-9 Units Subcutaneous Q4H  . insulin aspart  5 Units Subcutaneous TID WC  . insulin glargine  40 Units Subcutaneous QHS  . lanthanum  1,000 mg Oral TID WC  . levalbuterol  0.63 mg Nebulization TID  . pantoprazole  40 mg Oral Daily  . saccharomyces boulardii  250 mg Oral BID  . senna-docusate  1 tablet Oral QHS  . sodium bicarbonate  1,300 mg Oral TID   Continuous Infusions: . heparin 1,600 Units/hr (09/12/16 0406)  . dialysis replacement fluid (prismasate) 600 mL/hr at 09/12/16 1052  . dialysis replacement fluid (prismasate) 400 mL/hr at 09/11/16 1648  . dialysate (PRISMASATE) 1,500 mL/hr at 09/12/16 1050    PRN Meds: alum & mag hydroxide-simeth, bisacodyl, fentaNYL (SUBLIMAZE) injection, heparin, levalbuterol, metoCLOPramide (REGLAN) injection, metoprolol, simethicone, sodium chloride   Vital Signs    Vitals:   09/12/16 1000 09/12/16 1030 09/12/16 1045 09/12/16 1100  BP: (!) 170/74  (!) 163/67 (!) 161/68  Pulse: 76  86 80  Resp: '18  19 17  '$ Temp:      TempSrc:      SpO2: 96% 97% 93% 94%  Weight:      Height:        Intake/Output Summary (Last 24 hours) at 09/12/16 1204 Last data filed at 09/12/16 1100  Gross per 24 hour  Intake          1041.39 ml  Output             4738 ml  Net         -3696.61 ml   Filed Weights   09/05/16 1442 09/07/16 1459 09/12/16 0500  Weight: 225 lb (102.1 kg) 245 lb 9.5 oz (111.4 kg) 246 lb 7.6 oz (111.8 kg)    Physical Exam   GEN: Well nourished, well developed, see on CRRT, in no acute distress.  HEENT: Grossly normal.  Neck: Supple, no JVD, carotid bruits, or masses. Cardiac: RRR, no murmurs, rubs, or gallops. No clubbing, cyanosis, edema.  Radials/DP/PT 2+ and equal bilaterally.  Respiratory:  Respirations regular, lungs clear bilaterally with no  wheezes or rales GI:  tender, taught, BS minimal, drainage tube noted in RUQ MS: no deformity or atrophy. Skin: warm and dry, no rash. Neuro:  Strength and sensation are intact. Psych: AAOx3.  Normal affect.  Labs    CBC  Recent Labs  09/11/16 0359 09/12/16 0228  WBC 19.7* 20.7*  HGB 12.7* 12.1*  HCT 36.9* 34.8*  MCV 78.3 77.0*  PLT 292 242   Basic Metabolic Panel  Recent Labs  09/11/16 1700 09/12/16 0228  NA 140 139  139  K 4.4 4.0  4.0  CL 106 105  105  CO2 18* 22  22  GLUCOSE 134* 197*  198*  BUN 108* 77*  77*  CREATININE 8.03* 5.37*  5.35*  CALCIUM 7.8* 7.9*  7.8*  MG  --  2.5*  PHOS 6.4* 5.0*  4.9*   Liver Function Tests  Recent Labs  09/10/16 0356 09/11/16 1700 09/12/16 0228  AST 30  --  21  ALT 39  --  30  ALKPHOS 111  --  95  BILITOT 1.1  --   1.6*  PROT 6.2*  --  6.4*  ALBUMIN 1.6* 1.7* 1.8*  1.7*   No results for input(s): LIPASE, AMYLASE in the last 72 hours. Cardiac Enzymes  Recent Labs  09/09/16 1547 09/09/16 2130  TROPONINI 0.15* 0.04*   BNP Invalid input(s): POCBNP D-Dimer No results for input(s): DDIMER in the last 72 hours. Hemoglobin A1C No results for input(s): HGBA1C in the last 72 hours. Fasting Lipid Panel No results for input(s): CHOL, HDL, LDLCALC, TRIG, CHOLHDL, LDLDIRECT in the last 72 hours. Thyroid Function Tests No results for input(s): TSH, T4TOTAL, T3FREE, THYROIDAB in the last 72 hours.  Invalid input(s): FREET3  Telemetry    Sinus rhytm in the 70's, converted from afib, RVR at 1722 and maintaining SR. - Personally Reviewed  EKG- No new tracings  09/11/16 0529- Atrial fibrillation at 133 bpm 09/11/16 0939- sinus tachycardia 107 bpm 09/11/16 1037- Atrial flutter, 2:1 conduction 152 bpm  Radiology    Ct Abdomen Pelvis Wo Contrast  Result Date: 09/11/2016 CLINICAL DATA:  Left lower quadrant abdominal pain. EXAM: CT ABDOMEN AND PELVIS WITHOUT CONTRAST TECHNIQUE: Multidetector CT imaging of the abdomen and pelvis was performed following the standard protocol without IV contrast. COMPARISON:  09/07/2016 FINDINGS: Lower chest: Moderate right pleural effusion, increased since prior study. Consolidation in the right lower lobe. Left base atelectasis. Heart is normal size. Hepatobiliary: Interval placement of a cholecystostomy drainage catheter into the gallbladder with decompression of the gallbladder. The subcapsular fluid collection along the inferior right hepatic surface has decreased in size since prior study. Pancreas: No focal abnormality or ductal dilatation. Spleen: No focal abnormality.  Normal size. Adrenals/Urinary Tract: Perinephric stranding around the kidneys bilaterally, stable. Left renal cyst is also stable. No hydronephrosis. Adrenal glands are unremarkable. Foley catheter is present  in the bladder which is decompressed. Stomach/Bowel: Colonic diverticulosis. No active diverticulitis. Stomach and small bowel are decompressed, unremarkable. Vascular/Lymphatic: No evidence of aneurysm or adenopathy. Reproductive: No visible focal abnormality. Other: Small amount of free fluid in the pelvis and adjacent to the liver. No free air. Large left inguinal hernia containing fat, stable. Musculoskeletal: No acute bony abnormality or focal bone lesion. IMPRESSION: Interval placement of cholecystostomy drainage catheter into the gallbladder with decompression. The subcapsular fluid collection along the right hepatic margin has decreased in size as well. Small amount of free fluid in the pelvis and adjacent to the liver. Increasing moderate right pleural  effusion with right low consolidation. Cannot exclude pneumonia. Left base atelectasis. Left inguinal hernia containing fat. Electronically Signed   By: Rolm Baptise M.D.   On: 09/11/2016 12:20   Dg Chest Port 1 View  Result Date: 09/12/2016 CLINICAL DATA:  Respiratory failure. EXAM: PORTABLE CHEST 1 VIEW COMPARISON:  09/11/2016 . FINDINGS: Right IJ line in stable position. Stable cardiomegaly. Improved pulmonary interstitial edema. Persistent right base atelectasis. Improved left mid lung field subsegmental atelectasis. Small right pleural effusion . No pneumothorax. IMPRESSION: 1. Right IJ line stable position. 2. Stable mild cardiomegaly. Improvement of pulmonary interstitial edema. Small right pleural effusion. 3. Persistent right base atelectasis. Improvement of left mid lung field atelectasis . Electronically Signed   By: Marcello Moores  Register   On: 09/12/2016 07:10   Dg Chest Port 1 View  Result Date: 09/11/2016 CLINICAL DATA:  Hemodialysis catheter insertion, assess tip position ; patient's electronic health record is not available at time of this interpretation EXAM: PORTABLE CHEST 1 VIEW COMPARISON:  Portable exam 1534 hours compared 09/11/2016  FINDINGS: Tip of RIGHT jugular central venous catheter projects over SVC at the level of the carina. Upper normal heart size. Slight pulmonary vascular congestion. Mediastinal contours normal. Improved pulmonary edema with persistent atelectasis versus consolidation at RIGHT lung base. Associated RIGHT pleural effusion. No pneumothorax following central line insertion. IMPRESSION: No pneumothorax following central line placement. Improved pulmonary edema with persistent atelectasis versus consolidation and pleural effusion at RIGHT lung base. Electronically Signed   By: Lavonia Dana M.D.   On: 09/11/2016 15:46   Dg Chest Port 1 View  Result Date: 09/11/2016 CLINICAL DATA:  Onset epigastric pain today.  Wheezing. EXAM: PORTABLE CHEST 1 VIEW COMPARISON:  09/09/2016 FINDINGS: Diffuse bilateral airspace disease, right greater than left, most confluent in the right lower lung. While this could reflect edema, cannot exclude pneumonia. Heart is borderline in size. Probable right effusion. IMPRESSION: Bilateral airspace disease, right greater than left, most confluent in the right base. Edema versus infection. Electronically Signed   By: Rolm Baptise M.D.   On: 09/11/2016 11:13   Dg Abd Portable 1v  Result Date: 09/10/2016 CLINICAL DATA:  Abdominal pain.  Cholecystostomy. EXAM: PORTABLE ABDOMEN - 1 VIEW COMPARISON:  CT 09/07/2016 FINDINGS: Pigtail catheter right upper quadrant in satisfactory position most likely within the gallbladder. Normal bowel gas pattern. No obstruction or ileus. Small amount of contrast or ingested tablets in the stomach. IMPRESSION: Cholecystostomy tube in good position.  Normal bowel gas pattern. Electronically Signed   By: Franchot Gallo M.D.   On: 09/10/2016 15:38    Cardiac Studies   2D Echo 09/09/16  Study Conclusions  - Left ventricle: The cavity size was normal. Wall thickness was   increased in a pattern of moderate LVH. Systolic function was   vigorous. The estimated  ejection fraction was in the range of 65%   to 70%. Wall motion was normal; there were no regional wall   motion abnormalities. Doppler parameters are consistent with   abnormal left ventricular relaxation (grade 1 diastolic   dysfunction). - Aortic valve: Valve area (VTI): 2.67 cm^2. Valve area (Vmax):   2.21 cm^2. Valve area (Vmean): 2.53 cm^2. - Atrial septum: No defect or patent foramen ovale was identified. - Pulmonary arteries: Systolic pressure was moderately increased.   PA peak pressure: 40 mm Hg (S). - Technically adequate study.  Patient Profile     66 y/o male with h/o CKD, RA, DM and HTN, who presented on 09/05/16 with complaint  of right sided chest and RUQ abdominal pain, fever and chills, found to have right sided PNA and acute cholecystitis, now s/p percutaneous cholecystostomy. Also being treated for Sepsis due to E coli bacteremia. Hospitalization complicated by new onset atrial fibrillation.   Assessment & Plan    1. Paroxysmal Atrial Fibrillation -pt notes long history of having an irregular heart beat, but no previously documented reports of atrial fibrillation/flutter. New diagnosis of atrial fibrillation noted this admission, in the setting of acute right sided CAP and acute cholecystitis. He is being treated conservatively with antibiotics and percutaneous chole drain (not a current surgical candidate for lap chole, at this time, given active PNA).  -He spontaneously converted back to NSR on telemetry, and cardizem drip was converted to oral dosing at 120 mg. On 1/10 he developed atrial fib with RVR. Cardizem drip was resumed and converted to sinus rhythm on 10 mg/hr. Will transition to oral cardizem at '240mg'$  with 2 hour overlap. -Blood pressure has been on the high side. -2D echo shows normal LVEF and normal wall motion. -Continue to treat underlying medical illnesses and monitor on telemetry. -No ischemic CP to suggest underlying CAD. No plans for inpatient ischemic  w/u at this time.  -His CHA2DS2 VASc score is at least 3 for HTN, DM and age 1-74. Given CKD, his options are limited to Coumadin. Recommend initiation of Coumadin, per pharmacy, once cleared from a surgical standpoint. INR goal of 2-3. We will continue to follow along with you.   2. Leukocytosis -WBC's up from 11.4 (1/8) to 19.7 (1/10), 20.7 (1/11), afebrile -Continues to be treated for pneumonia -Elevated heart rate, ST and afib likely related to acute medical processes- being evaluated by IM and surgery  3. Hyponatremia/volume overload -Per nephrology- secondary to free water excretion defect associated with acute renal insufficiency. Improving with ultrafiltration on CRRT. (not a candidate for hemodialysis due to atrial flutter/tachycardia) -Pt breathing better today  Signed, Daune Perch, NP  09/12/2016, 12:04 PM   History and all data above reviewed.  Patient examined.  I agree with the findings as above. Denies chest pain or SOB  The patient exam reveals COR:RRR  ,  Lungs: Clear  ,  Abd: Positive bowel sounds, no rebound no guarding, Ext No edema  .  All available labs, radiology testing, previous records reviewed. Agree with documented assessment and plan. Atrial flutter:  Now NSR.  On PO Cardizem.  Warfarin when OK with surgery.    Gary Lang  12:48 PM  09/12/2016

## 2016-09-12 NOTE — Progress Notes (Signed)
Pt transferred to 63M on 09/11/16.  Currently intubated for respiratory failure and on CRRT.  PTA, pt independent, lives with wife.  Will need reevaluation from PT/OT once extubated.  Will follow progress.    Reinaldo Raddle, RN, BSN  Trauma/Neuro ICU Case Manager [phone]

## 2016-09-13 ENCOUNTER — Inpatient Hospital Stay (HOSPITAL_COMMUNITY): Payer: PPO

## 2016-09-13 LAB — CBC
HEMATOCRIT: 35.3 % — AB (ref 39.0–52.0)
Hemoglobin: 11.9 g/dL — ABNORMAL LOW (ref 13.0–17.0)
MCH: 26.5 pg (ref 26.0–34.0)
MCHC: 33.7 g/dL (ref 30.0–36.0)
MCV: 78.6 fL (ref 78.0–100.0)
Platelets: 245 10*3/uL (ref 150–400)
RBC: 4.49 MIL/uL (ref 4.22–5.81)
RDW: 16.1 % — ABNORMAL HIGH (ref 11.5–15.5)
WBC: 21.2 10*3/uL — AB (ref 4.0–10.5)

## 2016-09-13 LAB — RENAL FUNCTION PANEL
ANION GAP: 11 (ref 5–15)
Albumin: 1.8 g/dL — ABNORMAL LOW (ref 3.5–5.0)
BUN: 40 mg/dL — ABNORMAL HIGH (ref 6–20)
CHLORIDE: 102 mmol/L (ref 101–111)
CO2: 25 mmol/L (ref 22–32)
Calcium: 8 mg/dL — ABNORMAL LOW (ref 8.9–10.3)
Creatinine, Ser: 2.94 mg/dL — ABNORMAL HIGH (ref 0.61–1.24)
GFR calc non Af Amer: 21 mL/min — ABNORMAL LOW (ref >60)
GFR, EST AFRICAN AMERICAN: 24 mL/min — AB (ref >60)
Glucose, Bld: 109 mg/dL — ABNORMAL HIGH (ref 65–99)
Phosphorus: 3.5 mg/dL (ref 2.5–4.6)
Potassium: 4 mmol/L (ref 3.5–5.1)
Sodium: 138 mmol/L (ref 135–145)

## 2016-09-13 LAB — GLUCOSE, CAPILLARY
GLUCOSE-CAPILLARY: 95 mg/dL (ref 65–99)
GLUCOSE-CAPILLARY: 236 mg/dL — AB (ref 65–99)
Glucose-Capillary: 124 mg/dL — ABNORMAL HIGH (ref 65–99)
Glucose-Capillary: 205 mg/dL — ABNORMAL HIGH (ref 65–99)
Glucose-Capillary: 211 mg/dL — ABNORMAL HIGH (ref 65–99)
Glucose-Capillary: 253 mg/dL — ABNORMAL HIGH (ref 65–99)

## 2016-09-13 LAB — AEROBIC/ANAEROBIC CULTURE (SURGICAL/DEEP WOUND)

## 2016-09-13 LAB — AEROBIC/ANAEROBIC CULTURE W GRAM STAIN (SURGICAL/DEEP WOUND): Special Requests: NORMAL

## 2016-09-13 LAB — MAGNESIUM: MAGNESIUM: 2.5 mg/dL — AB (ref 1.7–2.4)

## 2016-09-13 LAB — HEPARIN LEVEL (UNFRACTIONATED): Heparin Unfractionated: 0.59 IU/mL (ref 0.30–0.70)

## 2016-09-13 MED ORDER — WHITE PETROLATUM GEL
Status: AC
  Administered 2016-09-13
  Filled 2016-09-13: qty 1

## 2016-09-13 MED ORDER — INSULIN ASPART 100 UNIT/ML ~~LOC~~ SOLN
0.0000 [IU] | Freq: Three times a day (TID) | SUBCUTANEOUS | Status: DC
  Administered 2016-09-13 (×2): 3 [IU] via SUBCUTANEOUS
  Administered 2016-09-13 – 2016-09-14 (×2): 5 [IU] via SUBCUTANEOUS
  Administered 2016-09-14: 7 [IU] via SUBCUTANEOUS
  Administered 2016-09-14: 9 [IU] via SUBCUTANEOUS
  Administered 2016-09-14: 7 [IU] via SUBCUTANEOUS
  Administered 2016-09-15 (×3): 9 [IU] via SUBCUTANEOUS
  Administered 2016-09-16: 5 [IU] via SUBCUTANEOUS
  Administered 2016-09-16: 7 [IU] via SUBCUTANEOUS
  Administered 2016-09-16 (×2): 5 [IU] via SUBCUTANEOUS
  Administered 2016-09-17: 1 [IU] via SUBCUTANEOUS
  Administered 2016-09-17: 2 [IU] via SUBCUTANEOUS
  Administered 2016-09-18: 1 [IU] via SUBCUTANEOUS
  Administered 2016-09-18: 2 [IU] via SUBCUTANEOUS
  Administered 2016-09-18: 1 [IU] via SUBCUTANEOUS
  Administered 2016-09-19: 3 [IU] via SUBCUTANEOUS
  Administered 2016-09-21: 2 [IU] via SUBCUTANEOUS
  Administered 2016-09-21: 3 [IU] via SUBCUTANEOUS
  Administered 2016-09-21: 2 [IU] via SUBCUTANEOUS
  Administered 2016-09-22: 5 [IU] via SUBCUTANEOUS
  Administered 2016-09-22: 7 [IU] via SUBCUTANEOUS
  Administered 2016-09-22: 3 [IU] via SUBCUTANEOUS
  Administered 2016-09-22: 5 [IU] via SUBCUTANEOUS
  Administered 2016-09-24: 2 [IU] via SUBCUTANEOUS
  Administered 2016-09-24: 3 [IU] via SUBCUTANEOUS
  Administered 2016-09-25 (×2): 5 [IU] via SUBCUTANEOUS
  Administered 2016-09-25 – 2016-09-26 (×2): 2 [IU] via SUBCUTANEOUS
  Administered 2016-09-26: 3 [IU] via SUBCUTANEOUS
  Administered 2016-09-26: 1 [IU] via SUBCUTANEOUS
  Administered 2016-09-27: 3 [IU] via SUBCUTANEOUS
  Administered 2016-09-27: 5 [IU] via SUBCUTANEOUS
  Administered 2016-09-27: 3 [IU] via SUBCUTANEOUS
  Administered 2016-09-28 – 2016-09-29 (×4): 2 [IU] via SUBCUTANEOUS
  Administered 2016-09-29: 1 [IU] via SUBCUTANEOUS

## 2016-09-13 NOTE — Progress Notes (Signed)
Patient Name: Gary Lang Date of Encounter: 09/13/2016  Primary Cardiologist: Dr. [name] Problem List     Active Problems:   Community acquired pneumonia of right lower lobe of lung ([name])   Acute respiratory failure with hypoxia ([name])   Acute cholecystitis due to biliary calculus   AKI (acute kidney injury) ([name])   Abdominal distention, non-gaseous   Acute abdominal pain   Fever     Subjective   He denies chest pain or SOB.    Inpatient Medications    Scheduled Meds: . acetylcysteine  3 mL Nebulization BID  . aspirin EC  81 mg Oral Daily  . cefTRIAXone (ROCEPHIN)  IV  2 g Intravenous Q24H  . cholecalciferol  1,000 Units Oral Daily  . cloNIDine  0.3 mg Oral TID  . diltiazem  240 mg Oral Daily  . hydrocortisone sodium succinate  50 mg Intravenous Q6H  . insulin aspart  0-9 Units Subcutaneous TID AC & HS  . insulin aspart  5 Units Subcutaneous TID WC  . insulin glargine  40 Units Subcutaneous QHS  . levalbuterol  0.63 mg Nebulization BID  . pantoprazole  40 mg Oral Daily  . saccharomyces boulardii  250 mg Oral BID  . senna-docusate  1 tablet Oral QHS  . sodium bicarbonate  1,300 mg Oral TID   Continuous Infusions: . heparin 1,600 Units/hr (09/13/16 1439)  . dialysis replacement fluid (prismasate) 600 mL/hr at 09/13/16 0417  . dialysis replacement fluid (prismasate) 400 mL/hr at 09/13/16 0809  . dialysate (PRISMASATE) 1,500 mL/hr at 09/13/16 0741   PRN Meds: alum & mag hydroxide-simeth, bisacodyl, diltiazem, fentaNYL (SUBLIMAZE) injection, heparin, levalbuterol, metoCLOPramide (REGLAN) injection, metoprolol, simethicone, sodium chloride   Vital Signs    Vitals:   09/13/16 1100 09/13/16 1200 09/13/16 1300 09/13/16 1400  BP: 131/66 125/62 116/61 135/66  Pulse: 68 73 75 73  Resp: '13 13 14 '$ (!) 22  Temp:  98 F (36.7 C)    TempSrc:  Axillary    SpO2: 98% 97% 96% 93%  Weight:      Height:        Intake/Output Summary (Last 24 hours) at  09/13/16 1522 Last data filed at 09/13/16 1400  Gross per 24 hour  Intake             1028 ml  Output             5213 ml  Net            -4185 ml   Filed Weights   09/07/16 1459 09/12/16 0500 09/13/16 0452  Weight: 245 lb 9.5 oz (111.4 kg) 246 lb 7.6 oz (111.8 kg) 248 lb 0.3 oz (112.5 kg)    Physical Exam    GEN: NAD.  Neck:  No JVD Cardiac: Regular Rate and Rhythm, no murmurs, rubs, or gallops.  Mild edema.  Radials/DP/PT 2+  and equal bilaterally.  Respiratory:  Respirations  regular and unlabored, clear to auscultation bilaterally. GI: Soft, nontender, nondistended, BS + x 4. Skin: warm and dry, no rash. Neuro:   Strength and sensation are intact. Psych:  AAOx3.  Normal affect.  Labs    CBC  Recent Labs  09/12/16 0228 09/13/16 0400  WBC 20.7* 21.2*  HGB 12.1* 11.9*  HCT 34.8* 35.3*  MCV 77.0* 78.6  PLT 229 735   Basic Metabolic Panel  Recent Labs  09/12/16 0228 09/12/16 1600 09/13/16 0400  NA 139  139 136 138  K 4.0  4.0 4.1 4.0  CL 105  105 102 102  CO2 '22  22 23 25  '$ GLUCOSE 197*  198* 227* 109*  BUN 77*  77* 54* 40*  CREATININE 5.37*  5.35* 3.71* 2.94*  CALCIUM 7.9*  7.8* 7.8* 8.0*  MG 2.5*  --  2.5*  PHOS 5.0*  4.9* 3.7 3.5   Liver Function Tests  Recent Labs  09/12/16 0228 09/12/16 1600 09/13/16 0400  AST 21  --   --   ALT 30  --   --   ALKPHOS 95  --   --   BILITOT 1.6*  --   --   PROT 6.4*  --   --   ALBUMIN 1.8*  1.7* 1.7* 1.8*   No results for input(s): LIPASE, AMYLASE in the last 72 hours. Cardiac Enzymes No results for input(s): CKTOTAL, CKMB, CKMBINDEX, TROPONINI in the last 72 hours. BNP Invalid input(s): POCBNP D-Dimer No results for input(s): DDIMER in the last 72 hours. Hemoglobin A1C No results for input(s): HGBA1C in the last 72 hours. Fasting Lipid Panel No results for input(s): CHOL, HDL, LDLCALC, TRIG, CHOLHDL, LDLDIRECT in the last 72 hours. Thyroid Function Tests No results for input(s): TSH, T4TOTAL,  T3FREE, THYROIDAB in the last 72 hours.  Invalid input(s): FREET3  Telemetry    NSR - Personally Reviewed  ECG    NA - Personally Reviewed  Radiology    Dg Chest Port 1 View  Result Date: 09/13/2016 CLINICAL DATA:  Follow-up pleural effusion. EXAM: PORTABLE CHEST 1 VIEW COMPARISON:  09/12/2016 FINDINGS: Right internal jugular central line tip is in the SVC at the azygos level. The left lung remains clear except for mild left base atelectasis. Right lower and middle lobe collapse persists. Probable subpulmonic effusion on the right. IMPRESSION: Only mild residual left base atelectasis. Persistent collapse of the right lower and middle lobes, probably with right subpulmonic effusion. Electronically Signed   By: Nelson Chimes M.D.   On: 09/13/2016 07:29   Dg Chest Port 1 View  Result Date: 09/12/2016 CLINICAL DATA:  Respiratory failure. EXAM: PORTABLE CHEST 1 VIEW COMPARISON:  09/11/2016 . FINDINGS: Right IJ line in stable position. Stable cardiomegaly. Improved pulmonary interstitial edema. Persistent right base atelectasis. Improved left mid lung field subsegmental atelectasis. Small right pleural effusion . No pneumothorax. IMPRESSION: 1. Right IJ line stable position. 2. Stable mild cardiomegaly. Improvement of pulmonary interstitial edema. Small right pleural effusion. 3. Persistent right base atelectasis. Improvement of left mid lung field atelectasis . Electronically Signed   By: Marcello Moores  Register   On: 09/12/2016 07:10   Dg Chest Port 1 View  Result Date: 09/11/2016 CLINICAL DATA:  Hemodialysis catheter insertion, assess tip position ; patient's electronic health record is not available at time of this interpretation EXAM: PORTABLE CHEST 1 VIEW COMPARISON:  Portable exam 1534 hours compared 09/11/2016 FINDINGS: Tip of RIGHT jugular central venous catheter projects over SVC at the level of the carina. Upper normal heart size. Slight pulmonary vascular congestion. Mediastinal contours  normal. Improved pulmonary edema with persistent atelectasis versus consolidation at RIGHT lung base. Associated RIGHT pleural effusion. No pneumothorax following central line insertion. IMPRESSION: No pneumothorax following central line placement. Improved pulmonary edema with persistent atelectasis versus consolidation and pleural effusion at RIGHT lung base. Electronically Signed   By: Lavonia Dana M.D.   On: 09/11/2016 15:46    Cardiac Studies   - Left ventricle: The cavity size was normal. Wall thickness was increased in a pattern of moderate LVH.  Systolic function was vigorous. The estimated ejection fraction was in the range of 65% to 70%. Wall motion was normal; there were no regional wall motion abnormalities. Doppler parameters are consistent with abnormal left ventricular relaxation (grade 1 diastolic dysfunction). - Aortic valve: Valve area (VTI): 2.67 cm^2. Valve area (Vmax): 2.21 cm^2. Valve area (Vmean): 2.53 cm^2. - Atrial septum: No defect or patent foramen ovale was identified. - Pulmonary arteries: Systolic pressure was moderately increased. PA peak pressure: 40 mm Hg (S). - Technically adequate study.  Patient Profile     66 y/o male with h/o CKD, RA, DM and HTN, who presented on 09/05/16 with complaint of right sided chest and RUQabdominal pain, fever and chills, found to have right sided PNA and acute cholecystitis, now s/p percutaneous cholecystostomy. Also being treated for Sepsis due to E coli bacteremia. Hospitalization complicated by new onset atrial fibrillation.   Assessment & Plan    ATRIAL FIB:  Seems to be maintaining NSR.  Tele reviewed.   Given his report of irregular rhythm in the past, I would assume that he has intermittent atrial fib.  Mr. Gary Lang has a CHA2DS2 - VASc score of 3 with a risk of stroke of 3.2%.  Should have warfarin when OK with primary and surgical services.     Signed, Minus Breeding, MD  09/13/2016, 3:22 PM

## 2016-09-13 NOTE — Progress Notes (Signed)
Subjective: Remains on CRRT, some oozing around right IJ catheter No abdominal complaints  Objective: Vital signs in last 24 hours: Temp:  [97.5 F (36.4 C)-98.6 F (37 C)] 97.5 F (36.4 C) (01/12 0400) Pulse Rate:  [66-86] 67 (01/12 0700) Resp:  [11-30] 13 (01/12 0700) BP: (121-175)/(57-76) 146/70 (01/12 0700) SpO2:  [91 %-97 %] 96 % (01/12 0700) Weight:  [112.5 kg (248 lb 0.3 oz)] 112.5 kg (248 lb 0.3 oz) (01/12 0452) Last BM Date: 09/12/16  Intake/Output from previous day: 01/11 0701 - 01/12 0700 In: 1094 [P.O.:340; I.V.:464; IV Piggyback:50] Out: 2505 [Urine:385; Drains:180] Intake/Output this shift: Total I/O In: 126 [P.O.:100; I.V.:16; Other:10] Out: 244 [Urine:30; Drains:30; Other:184]  GI: soft, non-tender; bowel sounds normal; no masses,  no organomegaly Clear bile in drainage bag  Lab Results:   Recent Labs  09/12/16 0228 09/13/16 0400  WBC 20.7* 21.2*  HGB 12.1* 11.9*  HCT 34.8* 35.3*  PLT 229 245   BMET  Recent Labs  09/12/16 1600 09/13/16 0400  NA 136 138  K 4.1 4.0  CL 102 102  CO2 23 25  GLUCOSE 227* 109*  BUN 54* 40*  CREATININE 3.71* 2.94*  CALCIUM 7.8* 8.0*   Lab Results  Component Value Date   ALT 30 09/12/2016   AST 21 09/12/2016   ALKPHOS 95 09/12/2016   BILITOT 1.6 (H) 09/12/2016    PT/INR No results for input(s): LABPROT, INR in the last 72 hours. ABG  Recent Labs  09/11/16 1627  PHART 7.402  HCO3 16.5*    Studies/Results: Ct Abdomen Pelvis Wo Contrast  Result Date: 09/11/2016 CLINICAL DATA:  Left lower quadrant abdominal pain. EXAM: CT ABDOMEN AND PELVIS WITHOUT CONTRAST TECHNIQUE: Multidetector CT imaging of the abdomen and pelvis was performed following the standard protocol without IV contrast. COMPARISON:  09/07/2016 FINDINGS: Lower chest: Moderate right pleural effusion, increased since prior study. Consolidation in the right lower lobe. Left base atelectasis. Heart is normal size. Hepatobiliary: Interval  placement of a cholecystostomy drainage catheter into the gallbladder with decompression of the gallbladder. The subcapsular fluid collection along the inferior right hepatic surface has decreased in size since prior study. Pancreas: No focal abnormality or ductal dilatation. Spleen: No focal abnormality.  Normal size. Adrenals/Urinary Tract: Perinephric stranding around the kidneys bilaterally, stable. Left renal cyst is also stable. No hydronephrosis. Adrenal glands are unremarkable. Foley catheter is present in the bladder which is decompressed. Stomach/Bowel: Colonic diverticulosis. No active diverticulitis. Stomach and small bowel are decompressed, unremarkable. Vascular/Lymphatic: No evidence of aneurysm or adenopathy. Reproductive: No visible focal abnormality. Other: Small amount of free fluid in the pelvis and adjacent to the liver. No free air. Large left inguinal hernia containing fat, stable. Musculoskeletal: No acute bony abnormality or focal bone lesion. IMPRESSION: Interval placement of cholecystostomy drainage catheter into the gallbladder with decompression. The subcapsular fluid collection along the right hepatic margin has decreased in size as well. Small amount of free fluid in the pelvis and adjacent to the liver. Increasing moderate right pleural effusion with right low consolidation. Cannot exclude pneumonia. Left base atelectasis. Left inguinal hernia containing fat. Electronically Signed   By: Rolm Baptise M.D.   On: 09/11/2016 12:20   Dg Chest Port 1 View  Result Date: 09/13/2016 CLINICAL DATA:  Follow-up pleural effusion. EXAM: PORTABLE CHEST 1 VIEW COMPARISON:  09/12/2016 FINDINGS: Right internal jugular central line tip is in the SVC at the azygos level. The left lung remains clear except for mild left base atelectasis. Right lower  and middle lobe collapse persists. Probable subpulmonic effusion on the right. IMPRESSION: Only mild residual left base atelectasis. Persistent collapse  of the right lower and middle lobes, probably with right subpulmonic effusion. Electronically Signed   By: Nelson Chimes M.D.   On: 09/13/2016 07:29   Dg Chest Port 1 View  Result Date: 09/12/2016 CLINICAL DATA:  Respiratory failure. EXAM: PORTABLE CHEST 1 VIEW COMPARISON:  09/11/2016 . FINDINGS: Right IJ line in stable position. Stable cardiomegaly. Improved pulmonary interstitial edema. Persistent right base atelectasis. Improved left mid lung field subsegmental atelectasis. Small right pleural effusion . No pneumothorax. IMPRESSION: 1. Right IJ line stable position. 2. Stable mild cardiomegaly. Improvement of pulmonary interstitial edema. Small right pleural effusion. 3. Persistent right base atelectasis. Improvement of left mid lung field atelectasis . Electronically Signed   By: Marcello Moores  Register   On: 09/12/2016 07:10   Dg Chest Port 1 View  Result Date: 09/11/2016 CLINICAL DATA:  Hemodialysis catheter insertion, assess tip position ; patient's electronic health record is not available at time of this interpretation EXAM: PORTABLE CHEST 1 VIEW COMPARISON:  Portable exam 1534 hours compared 09/11/2016 FINDINGS: Tip of RIGHT jugular central venous catheter projects over SVC at the level of the carina. Upper normal heart size. Slight pulmonary vascular congestion. Mediastinal contours normal. Improved pulmonary edema with persistent atelectasis versus consolidation at RIGHT lung base. Associated RIGHT pleural effusion. No pneumothorax following central line insertion. IMPRESSION: No pneumothorax following central line placement. Improved pulmonary edema with persistent atelectasis versus consolidation and pleural effusion at RIGHT lung base. Electronically Signed   By: Lavonia Dana M.D.   On: 09/11/2016 15:46   Dg Chest Port 1 View  Result Date: 09/11/2016 CLINICAL DATA:  Onset epigastric pain today.  Wheezing. EXAM: PORTABLE CHEST 1 VIEW COMPARISON:  09/09/2016 FINDINGS: Diffuse bilateral airspace  disease, right greater than left, most confluent in the right lower lung. While this could reflect edema, cannot exclude pneumonia. Heart is borderline in size. Probable right effusion. IMPRESSION: Bilateral airspace disease, right greater than left, most confluent in the right base. Edema versus infection. Electronically Signed   By: Rolm Baptise M.D.   On: 09/11/2016 11:13    Anti-infectives: Anti-infectives    Start     Dose/Rate Route Frequency Ordered Stop   09/12/16 1100  cefTRIAXone (ROCEPHIN) 2 g in dextrose 5 % 50 mL IVPB     2 g 100 mL/hr over 30 Minutes Intravenous Every 24 hours 09/12/16 1002     09/11/16 1000  piperacillin-tazobactam (ZOSYN) IVPB 2.25 g  Status:  Discontinued     2.25 g 100 mL/hr over 30 Minutes Intravenous Every 6 hours 09/11/16 0945 09/12/16 1002   09/10/16 1200  cefTRIAXone (ROCEPHIN) 2 g in dextrose 5 % 50 mL IVPB  Status:  Discontinued     2 g 100 mL/hr over 30 Minutes Intravenous Every 24 hours 09/10/16 0901 09/11/16 0933   09/08/16 2000  piperacillin-tazobactam (ZOSYN) IVPB 2.25 g  Status:  Discontinued     2.25 g 100 mL/hr over 30 Minutes Intravenous Every 6 hours 09/08/16 1310 09/10/16 0901   09/06/16 1000  azithromycin (ZITHROMAX) tablet 500 mg  Status:  Discontinued     500 mg Oral Every 24 hours 09/05/16 1422 09/08/16 0831   09/06/16 0800  cefTRIAXone (ROCEPHIN) 1 g in dextrose 5 % 50 mL IVPB  Status:  Discontinued     1 g 100 mL/hr over 30 Minutes Intravenous Every 24 hours 09/05/16 1422 09/05/16 1940  09/05/16 2000  piperacillin-tazobactam (ZOSYN) IVPB 3.375 g  Status:  Discontinued     3.375 g 12.5 mL/hr over 240 Minutes Intravenous Every 8 hours 09/05/16 1948 09/08/16 1310   09/05/16 0845  cefTRIAXone (ROCEPHIN) 1 g in dextrose 5 % 50 mL IVPB     1 g 100 mL/hr over 30 Minutes Intravenous  Once 09/05/16 0836 09/05/16 0912   09/05/16 0845  azithromycin (ZITHROMAX) 500 mg in dextrose 5 % 250 mL IVPB     500 mg 250 mL/hr over 60 Minutes  Intravenous  Once 09/05/16 0836 09/05/16 1017      Assessment/Plan: Cholecystitis - s/p per chole drain 1/7 - per IR needsOP follow-up in IR clinic in 5-6 weeks - CT showed no intra-abdominal source of infection; drain in place  Pneumonia RLL Atrial fibrillation Acute on chronic kidney disease stage III, h/o FSGS, has AVF in place DM HTN RA  ID - zosyn 1/4>>1/9, zithromax 1/4>>1/7, rocephin 1/9>> VTE - heparin FEN - carb modified  Plan - Continue perc drain for cholecystitis - patient improved from abdominal standpoint.  Will see PRN over the weekend.  Recheck on Monday if the patient is still here.   LOS: 8 days    Staley Lunz K. 09/13/2016

## 2016-09-13 NOTE — Progress Notes (Signed)
Patient is now on CRRT in ICU under critical care service. Discussed with CCM team. Please call [facility] when ready to transfer out of ICU.

## 2016-09-13 NOTE — Progress Notes (Signed)
PT Cancellation Note  Patient Details Name: Gary Lang MRN: [id number] DOB: 15-Jul-1951   Cancelled Treatment:    Reason Eval/Treat Not Completed: Medical issues which prohibited therapy.  Pt continues to be on CRRT with bleeding around line per RN.  Will hold mobility at this time and f/u as appropriate.     [place], [place] 09/13/2016, 9:22 AM

## 2016-09-13 NOTE — Progress Notes (Signed)
Inpatient Diabetes Program Recommendations  AACE/ADA: New Consensus Statement on Inpatient Glycemic Control (2015)  Target Ranges:  Prepandial:   less than 140 mg/dL      Peak postprandial:   less than 180 mg/dL (1-2 hours)      Critically ill patients:  140 - 180 mg/dL   Results for Gary Lang, Gary Lang (MRN [id number]) as of 09/13/2016 09:22  Ref. Range 09/12/2016 08:13 09/12/2016 11:22 09/12/2016 16:25 09/12/2016 21:02  Glucose-Capillary Latest Ref Range: 65 - 99 mg/dL 157 (H) 164 (H) 221 (H) 243 (H)    Home DM Meds: Lantus 40 units QHS       Novolog 18 units TID        Trulicity 1.5 mg Qweek  Current Insulin Orders: Lantus 40 units QHS                 Novolog Sensitive Correction Scale/ SSI (0-9 units) Q4 hours                 Novolog 5 units TID with meals       MD- Please consider the following in-hospital insulin adjustments:  1. Increase Novolog Meal Coverage to: Novolog 7 units TID with meals (hold if pt eats <50% of meal)  2. Change Novolog Sensitive Correction Scale/ SSI (0-9 units) to TID AC + HS (currently ordered as Q4 hour coverage and patient eating PO diet)      --Will follow patient during hospitalization--  Wyn Quaker RN, MSN, CDE Diabetes Coordinator Inpatient Glycemic Control Team Team Pager: [phone] (8a-5p)

## 2016-09-13 NOTE — Progress Notes (Signed)
CRRT stopped. 152 blood retuned. HD cath heparin locked.

## 2016-09-13 NOTE — Progress Notes (Signed)
PULMONARY / CRITICAL CARE MEDICINE   Name: Gary Lang MRN: [id number] DOB: 17-Jul-1951    ADMISSION DATE:  09/05/2016 CONSULTATION DATE:  09/11/16  REFERRING MD:  Allyson Sabal  CHIEF COMPLAINT:  AKI  Brief:   66 y.o. 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  He was admitted 09/05/16 with  E.Coli bacteremia due to acute cholecystitis. He was seen by [facility] and had percutaneous drain placed 01/09 as [facility] felt he was not stable enough for full surgery. Since admission, he has had worsening in renal function and also developed new onset paroxysmal atrial fib. On 1/10 Due to worsening renal function started on CVVHD (prefered over HD due to HR).   SIGNIFICANT EVENTS: 1/04 > admit sepsis > e.coli bacteremia secondary to acute cholecystitis 1/09 > Percutaneous drain placed  1/10 > cvvhd, fib rvr   SUBJECTIVE:  Remains on CRRT, Cardizem gtt off. No events overnight.   VITAL SIGNS: BP 134/65   Pulse 63   Temp 97.3 F (36.3 C) (Oral)   Resp 15   Ht '6\' 1"'$  (1.854 m)   Wt 112.5 kg (248 lb 0.3 oz)   SpO2 98%   BMI 32.72 kg/m   HEMODYNAMICS:    VENTILATOR SETTINGS:    INTAKE / OUTPUT: I/O last 3 completed shifts: In: 1676 [P.O.:340; I.V.:776; Other:360; IV Piggyback:200] Out: 8948 [Urine:760; Drains:240; NIDPO:2423]   PHYSICAL EXAMINATION:   General: Adult male, in bed, no distress  Neuro: A&O x 3, follows commands, moves all extermites  HEENT: [place]/AT. PERRL, sclerae anicteric. Cardiovascular: RRR, no M/R/G, NI S1/S2 Lungs: unlabored, clear breath sounds, diminished to right base  Abdomen: BS x 4, soft, NT/ND.  Musculoskeletal: No gross deformities, 1+ edema.  Skin: Intact, warm, no rashes.  LABS:  BMET  Recent Labs Lab 09/12/16 0228 09/12/16 1600 09/13/16 0400  NA 139  139 136 138  K 4.0  4.0 4.1 4.0  CL 105  105 102 102  CO2 '22  22 23 25  '$ BUN 77*  77* 54* 40*  CREATININE 5.37*  5.35* 3.71* 2.94*  GLUCOSE  197*  198* 227* 109*    Electrolytes  Recent Labs Lab 09/09/16 0938  09/12/16 0228 09/12/16 1600 09/13/16 0400  CALCIUM  --   < > 7.9*  7.8* 7.8* 8.0*  MG 2.3  --  2.5*  --  2.5*  PHOS  --   < > 5.0*  4.9* 3.7 3.5  < > = values in this interval not displayed.  CBC  Recent Labs Lab 09/11/16 0359 09/12/16 0228 09/13/16 0400  WBC 19.7* 20.7* 21.2*  HGB 12.7* 12.1* 11.9*  HCT 36.9* 34.8* 35.3*  PLT 292 229 245    Coag's  Recent Labs Lab 09/08/16 1017  INR 1.11    Sepsis Markers No results for input(s): LATICACIDVEN, PROCALCITON, O2SATVEN in the last 168 hours.  ABG  Recent Labs Lab 09/11/16 1627  PHART 7.402  PCO2ART 26.5*  PO2ART 77.0*    Liver Enzymes  Recent Labs Lab 09/09/16 0340 09/10/16 0356  09/12/16 0228 09/12/16 1600 09/13/16 0400  AST 57* 30  --  21  --   --   ALT 47 39  --  30  --   --   ALKPHOS 128* 111  --  95  --   --   BILITOT 1.2 1.1  --  1.6*  --   --   ALBUMIN 1.7* 1.6*  < > 1.8*  1.7*  1.7* 1.8*  < > = values in this interval not displayed.  Cardiac Enzymes  Recent Labs Lab 09/09/16 0938 09/09/16 1547 09/09/16 2130  TROPONINI 0.04* 0.15* 0.04*    Glucose  Recent Labs Lab 09/12/16 1122 09/12/16 1625 09/12/16 2102 09/13/16 0008 09/13/16 0348 09/13/16 0801  GLUCAP 164* 221* 243* 205* 124* 95    Imaging Dg Chest Port 1 View  Result Date: 09/13/2016 CLINICAL DATA:  Follow-up pleural effusion. EXAM: PORTABLE CHEST 1 VIEW COMPARISON:  09/12/2016 FINDINGS: Right internal jugular central line tip is in the SVC at the azygos level. The left lung remains clear except for mild left base atelectasis. Right lower and middle lobe collapse persists. Probable subpulmonic effusion on the right. IMPRESSION: Only mild residual left base atelectasis. Persistent collapse of the right lower and middle lobes, probably with right subpulmonic effusion. Electronically Signed   By: Nelson Chimes M.D.   On: 09/13/2016 07:29      STUDIES:  Echo 01/08 > EF 65-70%, G1DD, PAP 40.  CULTURES: Blood 01/04 > E.Coli. Gallbladder fluid 01/07 > E.Coli, K.Pneumoniae.  ANTIBIOTICS: Azithro 01/04 > 01/10 Ceftriaxone 01/04 > 01/05, 1/11 >  Zosyn 01/04 >1/11   LINES/TUBES: RIJ Vascath 1/11 >   DISCUSSION: 66 y.o. male admitted 01/04 with sepsis, found to have E.coli bacteremia from acute cholecystitis.  Has had worsening renal function since admit (baseline CKD III due to FSGS); therefore, transferred to ICU 01/10 for initiation of CRRT.  ASSESSMENT / PLAN:  RENAL A:   Acute on CKD III due to FSGS - now with worsening renal failure since admission. AGMA - renal failure. -Failed lasix challenge 1/12 P:  Continue Sodium bicarb tab  CRRT per renal - will d/c at 11:00 today  BMP per renal, neg balance  CARDIOVASCULAR A:  New onset PAF - back to NSR AM 01/10 then developed AFRVR that afternoon which did not respond to dilt + metop. Hx HTN. P:  Cardiology following Continue PO Cardizem  Continue heparin gtt Metoprolol and Cardizem PRN. Continue ASA, clonidine, hydralazine   INFECTIOUS A:   E.Coli bacteremia - likely due to acute cholecystitis. kleib in gallbaldder also P:   Continue ceftraixone Trend WBC and Fever Curve   PULMONARY A: Acute hypoxic respiratory failure. Concern for CAP. Atelectasis rt base P:   Chest pt, IS  Mycomyst x 1 more dose  Trend CXR  Volume removal  GASTROINTESTINAL A:   Nutrition. P:   Carb mod diet. PPI   HEMATOLOGIC A:   VTE Prophylaxis. P:  SCD's / heparin gtt. CBC in AM.  ENDOCRINE A:   DM. controlled P:   SSI, lantus, meal coverage  Glucose checks ACHS    NEUROLOGIC A:   No acute issues. P:   Monitor   AUTOIMMUNE A: Hx RA - on pred. P: Solu-Cortef '50mg'$  q6h  Family updated: Patient updated on plan 1/11  Interdisciplinary Family Meeting v Palliative Care Meeting:  Due by: 09/18/16.   [name], AG-ACNP [facility]  Pgr: [phone]  PCCM Pgr: [phone]  Attending Note:  I have examined patient, reviewed labs, studies and notes. I have discussed the case with Beaulah Corin, and I agree with the data and plans as amended above. 66 yo man with hx CRI, admitted with cholecystitis, E coli bacteremia and sepsis c/b A fib and acute on chronic renal failure. He was treated with abx and a percutaneous drain was placed. He was started on CVVHD 1/10. On my eval he  comfortable. His CVVHD was just stopped and he is hemodynamically stable. Lungs clear. Awake and alert. We will plan to continue his pulm hygiene, current abx. Will defer duration of perc drain placement to [facility]. Now that he is off CVVHD we will need to wait to assess for renal recovery. He can transfer out of ICU this afternoon. Will ask [facility] to assume his care as of 1/13.   Independent critical care time is 35 minutes.   Baltazar Apo, MD, PhD 09/13/2016, 1:57 PM [facility] [phone] or if no answer [phone]

## 2016-09-13 NOTE — Progress Notes (Signed)
[facility] for Heparin Indication: atrial fibrillation  Allergies  Allergen Reactions  . Glyburide Other (See Comments)    unknown  . Methotrexate Derivatives Other (See Comments)    Patient cannot remember    Patient Measurements: Height: '6\' 1"'$  (185.4 cm) Weight: 248 lb 0.3 oz (112.5 kg) IBW/kg (Calculated) : 79.9 Heparin Dosing Weight: 103 kg  Vital Signs: Temp: 97.5 F (36.4 C) (01/12 0400) Temp Source: Axillary (01/12 0400) BP: 146/70 (01/12 0700) Pulse Rate: 67 (01/12 0700)  Labs:  Recent Labs  09/11/16 0359  09/12/16 0228 09/12/16 0823 09/12/16 1600 09/13/16 0400  HGB 12.7*  --  12.1*  --   --  11.9*  HCT 36.9*  --  34.8*  --   --  35.3*  PLT 292  --  229  --   --  245  HEPARINUNFRC  --   --  0.65 0.54  --  0.59  CREATININE 7.91*  < > 5.37*  5.35*  --  3.71* 2.94*  < > = values in this interval not displayed.  Estimated Creatinine Clearance: 32.5 mL/min (by C-G formula based on SCr of 2.94 mg/dL (H)).    Scheduled:  . acetylcysteine  3 mL Nebulization BID  . aspirin EC  81 mg Oral Daily  . cefTRIAXone (ROCEPHIN)  IV  2 g Intravenous Q24H  . cholecalciferol  1,000 Units Oral Daily  . cloNIDine  0.3 mg Oral TID  . diltiazem  240 mg Oral Daily  . hydrocortisone sodium succinate  50 mg Intravenous Q6H  . insulin aspart  0-9 Units Subcutaneous Q4H  . insulin aspart  5 Units Subcutaneous TID WC  . insulin glargine  40 Units Subcutaneous QHS  . lanthanum  1,000 mg Oral TID WC  . levalbuterol  0.63 mg Nebulization BID  . pantoprazole  40 mg Oral Daily  . saccharomyces boulardii  250 mg Oral BID  . senna-docusate  1 tablet Oral QHS  . sodium bicarbonate  1,300 mg Oral TID   Infusions:  . heparin 1,600 Units/hr (09/12/16 2105)  . dialysis replacement fluid (prismasate) 600 mL/hr at 09/13/16 0417  . dialysis replacement fluid (prismasate) 400 mL/hr at 09/12/16 2006  . dialysate (PRISMASATE) 1,500 mL/hr at 09/13/16  0741    Assessment: 66yo male with new onset Afib with RVR. Pharmacy is consulted to dose heparin  This morning's heparin level remains therapeutic at 0.59 on heparin 1600 units/hr. No issues with infusion or bleeding noted.  Goal of Therapy:  Heparin level 0.3-0.7 units/ml Monitor platelets by anticoagulation protocol: Yes   Plan:  Continue heparin 1600 units/hr Daily HL Monitor s/sx of bleeding  Andrey Cota. Diona Foley, PharmD, [place] Clinical Pharmacist Pager [phone] 09/13/2016 7:56 AM

## 2016-09-13 NOTE — Progress Notes (Signed)
Patient ID: Gary Lang, male   DOB: 06-Mar-1951, 67 y.o.   MRN: [id number] [facility] Progress Note   Assessment/ Plan:   1. 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 Successful ultrafiltration with CRRT over the past 48 hours-will discontinue CRRT this morning and monitor for renal recovery with the option of undertaking intermittent dialysis if needed for renal clearance. I will evaluate him again later today to decide on need for supplemental diuretic dose to augment urine output. Clinically doing better and with significant improvement of shortness of breath. 2. Right lower lobe pneumonia/acute cholecystitis: On broad-spectrum antibiotic therapy and status post percutaneous cholecystostomy drain. No acute intra-abdominal pathology noted on CT scan yesterday-incidental note of peritoneal fluid accumulation/pleural effusion noted- clinically appears to have been improved with ultrafiltration with CRRT  3. Hyponatremia/volume overload: Hyponatremia corrected-volume status improved with ultrafiltration 4. Hypertension: Blood pressures improved with ultrafiltration and hemodialysis and ongoing antihypertensive therapy with clonidine and diltiazem. 5. Hyperphosphatemia: Corrected with CRRT-hold Fosrenol at this time. 6. Atrial flutter with rapid ventricular response: Rate controlled with diltiazem-now back in sinus rhythm Subjective:   No acute events overnight, tolerated CRRT overnight without problems-denies any complaints at this time. States that he is breathing much better.    Objective:   BP (!) 155/74   Pulse 75   Temp 97.3 F (36.3 C) (Oral)   Resp (!) 22   Ht '6\' 1"'$  (1.854 m)   Wt 112.5 kg (248 lb 0.3 oz)   SpO2 97%   BMI 32.72 kg/m   Intake/Output Summary (Last 24 hours) at 09/13/16 1033 Last data filed at 09/13/16 1008  Gross per 24 hour  Intake             1254 ml   Output             6180 ml  Net            -4926 ml   Weight change: 0.7 kg (1 lb 8.7 oz)  Physical Exam: Gen: Comfortably resting in bed-awake and interactive CVS: Pulse Regular rhythm, normal rate  Resp: Clear to auscultation, no rales Abd: Soft, obese, nontender, RUQ drain in situ Ext: Trace lower extremity edema  Imaging: Ct Abdomen Pelvis Wo Contrast  Result Date: 09/11/2016 CLINICAL DATA:  Left lower quadrant abdominal pain. EXAM: CT ABDOMEN AND PELVIS WITHOUT CONTRAST TECHNIQUE: Multidetector CT imaging of the abdomen and pelvis was performed following the standard protocol without IV contrast. COMPARISON:  09/07/2016 FINDINGS: Lower chest: Moderate right pleural effusion, increased since prior study. Consolidation in the right lower lobe. Left base atelectasis. Heart is normal size. Hepatobiliary: Interval placement of a cholecystostomy drainage catheter into the gallbladder with decompression of the gallbladder. The subcapsular fluid collection along the inferior right hepatic surface has decreased in size since prior study. Pancreas: No focal abnormality or ductal dilatation. Spleen: No focal abnormality.  Normal size. Adrenals/Urinary Tract: Perinephric stranding around the kidneys bilaterally, stable. Left renal cyst is also stable. No hydronephrosis. Adrenal glands are unremarkable. Foley catheter is present in the bladder which is decompressed. Stomach/Bowel: Colonic diverticulosis. No active diverticulitis. Stomach and small bowel are decompressed, unremarkable. Vascular/Lymphatic: No evidence of aneurysm or adenopathy. Reproductive: No visible focal abnormality. Other: Small amount of free fluid in the pelvis and adjacent to the liver. No free air. Large left inguinal hernia containing fat, stable. Musculoskeletal: No acute bony abnormality or focal bone lesion. IMPRESSION: Interval placement  of cholecystostomy drainage catheter into the gallbladder with decompression. The  subcapsular fluid collection along the right hepatic margin has decreased in size as well. Small amount of free fluid in the pelvis and adjacent to the liver. Increasing moderate right pleural effusion with right low consolidation. Cannot exclude pneumonia. Left base atelectasis. Left inguinal hernia containing fat. Electronically Signed   By: Rolm Baptise M.D.   On: 09/11/2016 12:20   Dg Chest Port 1 View  Result Date: 09/13/2016 CLINICAL DATA:  Follow-up pleural effusion. EXAM: PORTABLE CHEST 1 VIEW COMPARISON:  09/12/2016 FINDINGS: Right internal jugular central line tip is in the SVC at the azygos level. The left lung remains clear except for mild left base atelectasis. Right lower and middle lobe collapse persists. Probable subpulmonic effusion on the right. IMPRESSION: Only mild residual left base atelectasis. Persistent collapse of the right lower and middle lobes, probably with right subpulmonic effusion. Electronically Signed   By: Nelson Chimes M.D.   On: 09/13/2016 07:29   Dg Chest Port 1 View  Result Date: 09/12/2016 CLINICAL DATA:  Respiratory failure. EXAM: PORTABLE CHEST 1 VIEW COMPARISON:  09/11/2016 . FINDINGS: Right IJ line in stable position. Stable cardiomegaly. Improved pulmonary interstitial edema. Persistent right base atelectasis. Improved left mid lung field subsegmental atelectasis. Small right pleural effusion . No pneumothorax. IMPRESSION: 1. Right IJ line stable position. 2. Stable mild cardiomegaly. Improvement of pulmonary interstitial edema. Small right pleural effusion. 3. Persistent right base atelectasis. Improvement of left mid lung field atelectasis . Electronically Signed   By: Marcello Moores  Register   On: 09/12/2016 07:10   Dg Chest Port 1 View  Result Date: 09/11/2016 CLINICAL DATA:  Hemodialysis catheter insertion, assess tip position ; patient's electronic health record is not available at time of this interpretation EXAM: PORTABLE CHEST 1 VIEW COMPARISON:  Portable  exam 1534 hours compared 09/11/2016 FINDINGS: Tip of RIGHT jugular central venous catheter projects over SVC at the level of the carina. Upper normal heart size. Slight pulmonary vascular congestion. Mediastinal contours normal. Improved pulmonary edema with persistent atelectasis versus consolidation at RIGHT lung base. Associated RIGHT pleural effusion. No pneumothorax following central line insertion. IMPRESSION: No pneumothorax following central line placement. Improved pulmonary edema with persistent atelectasis versus consolidation and pleural effusion at RIGHT lung base. Electronically Signed   By: Lavonia Dana M.D.   On: 09/11/2016 15:46   Dg Chest Port 1 View  Result Date: 09/11/2016 CLINICAL DATA:  Onset epigastric pain today.  Wheezing. EXAM: PORTABLE CHEST 1 VIEW COMPARISON:  09/09/2016 FINDINGS: Diffuse bilateral airspace disease, right greater than left, most confluent in the right lower lung. While this could reflect edema, cannot exclude pneumonia. Heart is borderline in size. Probable right effusion. IMPRESSION: Bilateral airspace disease, right greater than left, most confluent in the right base. Edema versus infection. Electronically Signed   By: Rolm Baptise M.D.   On: 09/11/2016 11:13    Labs: BMET  Recent Labs Lab 09/09/16 2130 09/10/16 0356 09/11/16 0359 09/11/16 1700 09/12/16 0228 09/12/16 1600 09/13/16 0400  NA 135 137 139 140 139  139 136 138  K 4.2 4.5 4.1 4.4 4.0  4.0 4.1 4.0  CL 105 106 105 106 105  105 102 102  CO2 16* 18* 15* 18* '22  22 23 25  '$ GLUCOSE 410* 299* 205* 134* 197*  198* 227* 109*  BUN 80* 81* 102* 108* 77*  77* 54* 40*  CREATININE 7.05* 7.25* 7.91* 8.03* 5.37*  5.35* 3.71* 2.94*  CALCIUM 8.0*  8.0* 8.2* 7.8* 7.9*  7.8* 7.8* 8.0*  PHOS  --   --   --  6.4* 5.0*  4.9* 3.7 3.5   CBC  Recent Labs Lab 09/09/16 0340 09/11/16 0359 09/12/16 0228 09/13/16 0400  WBC 11.4* 19.7* 20.7* 21.2*  HGB 10.6* 12.7* 12.1* 11.9*  HCT 32.2* 36.9*  34.8* 35.3*  MCV 81.7 78.3 77.0* 78.6  PLT 180 292 229 245    Medications:    . acetylcysteine  3 mL Nebulization BID  . aspirin EC  81 mg Oral Daily  . cefTRIAXone (ROCEPHIN)  IV  2 g Intravenous Q24H  . cholecalciferol  1,000 Units Oral Daily  . cloNIDine  0.3 mg Oral TID  . diltiazem  240 mg Oral Daily  . hydrocortisone sodium succinate  50 mg Intravenous Q6H  . insulin aspart  0-9 Units Subcutaneous TID AC & HS  . insulin aspart  5 Units Subcutaneous TID WC  . insulin glargine  40 Units Subcutaneous QHS  . levalbuterol  0.63 mg Nebulization BID  . pantoprazole  40 mg Oral Daily  . saccharomyces boulardii  250 mg Oral BID  . senna-docusate  1 tablet Oral QHS  . sodium bicarbonate  1,300 mg Oral TID   Elmarie Shiley, MD 09/13/2016, 10:33 AM

## 2016-09-14 ENCOUNTER — Inpatient Hospital Stay (HOSPITAL_COMMUNITY): Payer: PPO

## 2016-09-14 LAB — RENAL FUNCTION PANEL
Albumin: 1.7 g/dL — ABNORMAL LOW (ref 3.5–5.0)
Anion gap: 10 (ref 5–15)
BUN: 76 mg/dL — AB (ref 6–20)
CALCIUM: 7.7 mg/dL — AB (ref 8.9–10.3)
CHLORIDE: 100 mmol/L — AB (ref 101–111)
CO2: 23 mmol/L (ref 22–32)
CREATININE: 4.64 mg/dL — AB (ref 0.61–1.24)
GFR calc Af Amer: 14 mL/min — ABNORMAL LOW (ref >60)
GFR calc non Af Amer: 12 mL/min — ABNORMAL LOW (ref >60)
GLUCOSE: 271 mg/dL — AB (ref 65–99)
Phosphorus: 6.7 mg/dL — ABNORMAL HIGH (ref 2.5–4.6)
Potassium: 4.9 mmol/L (ref 3.5–5.1)
SODIUM: 133 mmol/L — AB (ref 135–145)

## 2016-09-14 LAB — BASIC METABOLIC PANEL
ANION GAP: 11 (ref 5–15)
BUN: 81 mg/dL — ABNORMAL HIGH (ref 6–20)
CALCIUM: 7.8 mg/dL — AB (ref 8.9–10.3)
CO2: 23 mmol/L (ref 22–32)
Chloride: 99 mmol/L — ABNORMAL LOW (ref 101–111)
Creatinine, Ser: 4.92 mg/dL — ABNORMAL HIGH (ref 0.61–1.24)
GFR, EST AFRICAN AMERICAN: 13 mL/min — AB (ref >60)
GFR, EST NON AFRICAN AMERICAN: 11 mL/min — AB (ref >60)
Glucose, Bld: 342 mg/dL — ABNORMAL HIGH (ref 65–99)
POTASSIUM: 4.7 mmol/L (ref 3.5–5.1)
Sodium: 133 mmol/L — ABNORMAL LOW (ref 135–145)

## 2016-09-14 LAB — GLUCOSE, CAPILLARY
GLUCOSE-CAPILLARY: 361 mg/dL — AB (ref 65–99)
Glucose-Capillary: 266 mg/dL — ABNORMAL HIGH (ref 65–99)
Glucose-Capillary: 350 mg/dL — ABNORMAL HIGH (ref 65–99)
Glucose-Capillary: 369 mg/dL — ABNORMAL HIGH (ref 65–99)

## 2016-09-14 LAB — CBC
HEMATOCRIT: 31.7 % — AB (ref 39.0–52.0)
Hemoglobin: 10.6 g/dL — ABNORMAL LOW (ref 13.0–17.0)
MCH: 26.6 pg (ref 26.0–34.0)
MCHC: 33.4 g/dL (ref 30.0–36.0)
MCV: 79.6 fL (ref 78.0–100.0)
Platelets: 237 10*3/uL (ref 150–400)
RBC: 3.98 MIL/uL — ABNORMAL LOW (ref 4.22–5.81)
RDW: 15.8 % — AB (ref 11.5–15.5)
WBC: 16.7 10*3/uL — AB (ref 4.0–10.5)

## 2016-09-14 LAB — MAGNESIUM: Magnesium: 2.7 mg/dL — ABNORMAL HIGH (ref 1.7–2.4)

## 2016-09-14 LAB — HEPARIN LEVEL (UNFRACTIONATED): Heparin Unfractionated: 0.63 IU/mL (ref 0.30–0.70)

## 2016-09-14 MED ORDER — FUROSEMIDE 10 MG/ML IJ SOLN
40.0000 mg | Freq: Two times a day (BID) | INTRAMUSCULAR | Status: AC
  Administered 2016-09-14 – 2016-09-15 (×3): 40 mg via INTRAVENOUS
  Filled 2016-09-14 (×2): qty 4

## 2016-09-14 MED ORDER — HEPARIN (PORCINE) IN NACL 100-0.45 UNIT/ML-% IJ SOLN
INTRAMUSCULAR | Status: AC
  Filled 2016-09-14: qty 250

## 2016-09-14 NOTE — Progress Notes (Signed)
PULMONARY / CRITICAL CARE MEDICINE   Name: Gary Lang MRN: [id number] DOB: 06-01-1951    ADMISSION DATE:  09/05/2016 CONSULTATION DATE:  09/11/16  REFERRING MD:  Allyson Sabal  CHIEF COMPLAINT:  AKI  Brief:   66 y.o. 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  He was admitted 09/05/16 with  E.Coli bacteremia due to acute cholecystitis. He was seen by [facility] and had percutaneous drain placed 01/09 as [facility] felt he was not stable enough for full surgery. Since admission, he has had worsening in renal function and also developed new onset paroxysmal atrial fib. On 1/10 Due to worsening renal function started on CVVHD (prefered over HD due to HR).   SIGNIFICANT EVENTS: 1/04 > admit sepsis > e.coli bacteremia secondary to acute cholecystitis 1/09 > Percutaneous drain placed  1/10 > cvvhd, fib rvr   SUBJECTIVE:  Remains on CRRT, Cardizem gtt off. No events overnight. He states feeling much better. Denies pain or SOB while in bed with O2.  VITAL SIGNS: BP (!) 142/57 (BP Location: Right Leg)   Pulse 64   Temp 97.5 F (36.4 C) (Oral)   Resp 18   Ht '6\' 1"'$  (1.854 m)   Wt 112.5 kg (248 lb 0.3 oz)   SpO2 97%   BMI 32.72 kg/m   HEMODYNAMICS:    VENTILATOR SETTINGS:    INTAKE / OUTPUT: I/O last 3 completed shifts: In: 2145 [P.O.:1320; I.V.:560; ZDGUY:403; IV Piggyback:50] Out: 4742 [Urine:570; Drains:200; Other:3888]   PHYSICAL EXAMINATION:   General: Adult male, in bed, no distress, eating  Neuro: A&O x 3, follows commands, moves all extermites  HEENT: /AT. PERRL, sclerae anicteric. Cardiovascular: RRR, no M/R/G, NI S1/S2 Lungs: unlabored, clear breath sounds, diminished to bases  Abdomen: BS x 4, soft, NT/ND.  Musculoskeletal: No gross deformities, 1+ edema.  Skin: Intact, warm, no rashes.  LABS:  BMET  Recent Labs Lab 09/12/16 1600 09/13/16 0400 09/14/16 0647  NA 136 138 133*  K 4.1 4.0 4.9  CL 102 102 100*  CO2  '23 25 23  '$ BUN 54* 40* 76*  CREATININE 3.71* 2.94* 4.64*  GLUCOSE 227* 109* 271*    Electrolytes  Recent Labs Lab 09/12/16 0228 09/12/16 1600 09/13/16 0400 09/14/16 0647  CALCIUM 7.9*  7.8* 7.8* 8.0* 7.7*  MG 2.5*  --  2.5* 2.7*  PHOS 5.0*  4.9* 3.7 3.5 6.7*    CBC  Recent Labs Lab 09/12/16 0228 09/13/16 0400 09/14/16 0655  WBC 20.7* 21.2* 16.7*  HGB 12.1* 11.9* 10.6*  HCT 34.8* 35.3* 31.7*  PLT 229 245 237    Coag's  Recent Labs Lab 09/08/16 1017  INR 1.11    Sepsis Markers No results for input(s): LATICACIDVEN, PROCALCITON, O2SATVEN in the last 168 hours.  ABG  Recent Labs Lab 09/11/16 1627  PHART 7.402  PCO2ART 26.5*  PO2ART 77.0*    Liver Enzymes  Recent Labs Lab 09/09/16 0340 09/10/16 0356  09/12/16 0228 09/12/16 1600 09/13/16 0400 09/14/16 0647  AST 57* 30  --  21  --   --   --   ALT 47 39  --  30  --   --   --   ALKPHOS 128* 111  --  95  --   --   --   BILITOT 1.2 1.1  --  1.6*  --   --   --   ALBUMIN 1.7* 1.6*  < > 1.8*  1.7* 1.7* 1.8* 1.7*  < > =  values in this interval not displayed.  Cardiac Enzymes  Recent Labs Lab 09/09/16 0938 09/09/16 1547 09/09/16 2130  TROPONINI 0.04* 0.15* 0.04*    Glucose  Recent Labs Lab 09/13/16 0348 09/13/16 0801 09/13/16 1353 09/13/16 1606 09/13/16 2018 09/14/16 0751  GLUCAP 124* 95 253* 236* 211* 266*    Imaging Dg Chest Port 1 View  Result Date: 09/14/2016 CLINICAL DATA:  Followup pleural effusion. EXAM: PORTABLE CHEST 1 VIEW COMPARISON:  09/13/2016.  Chest CT dated 09/07/2016. FINDINGS: The right jugular catheter is unchanged. Elevation of the right hemidiaphragm and right pleural fluid are unchanged. Mild left lower lobe atelectasis. Stable lower thoracic spine degenerative changes. IMPRESSION: 1. Stable elevated right hemidiaphragm and right pleural fluid. 2. Mild left lower lobe atelectasis. Electronically Signed   By: Claudie Revering M.D.   On: 09/14/2016 08:46  CXR  reviewed   STUDIES:  Echo 01/08 > EF 65-70%, G1DD, PAP 40.  CULTURES: Blood 01/04 > E.Coli. Gallbladder fluid 01/07 > E.Coli, K.Pneumoniae.  ANTIBIOTICS: Azithro 01/04 > 01/10 Ceftriaxone 01/04 > 01/05, 1/11 >  Zosyn 01/04 >1/11   LINES/TUBES: RIJ Vascath 1/11 >   DISCUSSION: 66 y.o. male admitted 01/04 with sepsis, found to have E.coli bacteremia from acute cholecystitis.  Has had worsening renal function since admit (baseline CKD III due to FSGS); therefore, transferred to ICU 01/10 for initiation of CRRT. Now followed by Nephrology and Cardiology. He has improved and can transfer to [facility].  ASSESSMENT / PLAN:  RENAL A:   Acute on CKD III due to FSGS - now with worsening renal failure since admission. AGMA - renal failure. -Failed lasix challenge 1/12 P:  Continue Sodium bicarb tab  CRRT per renal  BMP per renal, neg balance  CARDIOVASCULAR A:  New onset PAF - back to NSR AM 01/10 then developed AFRVR that afternoon which did not respond to dilt + metop. Hx HTN. Rhythm regular at my exam 1/13 P:  Cardiology following Continue PO Cardizem  Continue heparin gtt Metoprolol and Cardizem PRN. Continue ASA, clonidine, hydralazine   INFECTIOUS A:   E.Coli bacteremia - likely due to acute cholecystitis. kleib in gallbaldder also P:   Continue ceftraixone Trend WBC and Fever Curve   PULMONARY A: Acute hypoxic respiratory failure. Concern for CAP. Atelectasis rt base P:   Chest pt, IS  Mucomyst completed Trend CXR  Volume removal  GASTROINTESTINAL A:   Nutrition. P:   Carb mod diet. PPI   HEMATOLOGIC A:   VTE Prophylaxis. P:  SCD's / heparin gtt. CBC in AM.  ENDOCRINE A:   DM. controlled P:   SSI, lantus, meal coverage  Glucose checks ACHS    NEUROLOGIC A:   No acute issues. P:   Monitor   AUTOIMMUNE A: Hx RA - on pred. P: Solu-Cortef '50mg'$  q6h  Family updated: Patient updated on plan 1/11  Interdisciplinary Family Meeting v  Palliative Care Meeting:  Due by: 09/18/16.  Attending Note:  I have examined patient, reviewed labs, studies and notes. I have discussed the case with Beaulah Corin, and I agree with the data and plans as amended above. 66 yo man with hx CRI, admitted with cholecystitis, E coli bacteremia and sepsis c/b A fib and acute on chronic renal failure. He was treated with abx and a percutaneous drain was placed. He was started on CVVHD 1/10. On my eval he comfortable. His CVVHD was just stopped and he is hemodynamically stable. Lungs clear. Awake and alert. We will plan to continue his  pulm hygiene, current abx. Will defer duration of perc drain placement to [facility]. Now that he is off CVVHD we will need to wait to assess for renal recovery. He can transfer out of ICU this afternoon. Will ask [facility] to assume his care as of 1/13.   Independent critical care time is 35 minutes.   CD Khaleed Holan, MD 09/14/2016, 10:30 AM [facility] p [phone] or if no answer [phone]

## 2016-09-14 NOTE — Progress Notes (Signed)
Patient ID: Gary Lang, male   DOB: [birth year], 66 y.o.   MRN: [id number]  [facility] Progress Note   Assessment/ Plan:   1. 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 Was transiently on CRRT until yesterday for ultrafiltration after he developed acute dyspnea from pulmonary edema/worsening renal function and A. flutter with RVR. Now off CRRT since yesterday with some urine output-will augment this with diuretics today. Awaiting labs from this morning to decide on need for further intervention. Clinically, without any uremic signs or symptoms to prompt need for dialysis. 2. Right lower lobe pneumonia/acute cholecystitis: On broad-spectrum antibiotic therapy and status post percutaneous cholecystostomy drain.  3. Hyponatremia/volume overload: Hyponatremia corrected-volume status improved with ultrafiltration, awaiting labs from this morning 4. Hypertension: Improved with ultrafiltration/oral and hypertensive agents-we'll continue to monitor with ongoing diuretic therapy. 5. Hyperphosphatemia: Corrected with CRRT- continue to monitor off binders. 6. Atrial flutter with rapid ventricular response: Rate controlled with diltiazem-now back in sinus rhythm Subjective:   CRRT discontinued yesterday and patient transferred to the renal floor-reports uneventful night.    Objective:   BP (!) 142/57 (BP Location: Right Leg)   Pulse 64   Temp 97.5 F (36.4 C) (Oral)   Resp 18   Ht '6\' 1"'$  (1.854 m)   Wt 112.5 kg (248 lb 0.3 oz)   SpO2 97%   BMI 32.72 kg/m   Intake/Output Summary (Last 24 hours) at 09/14/16 1018 Last data filed at 09/14/16 0600  Gross per 24 hour  Intake             1325 ml  Output              890 ml  Net              435 ml   Weight change: 0 kg (0 lb)  Physical Exam: Gen: Comfortably resting in bed-awake and interactive CVS: Pulse Regular rhythm, normal rate  Resp:  Clear to auscultation, no rales Abd: Soft, obese, nontender, RUQ drain in situ Ext: Trace lower extremity edema  Imaging: Dg Chest Port 1 View  Result Date: 09/14/2016 CLINICAL DATA:  Followup pleural effusion. EXAM: PORTABLE CHEST 1 VIEW COMPARISON:  09/13/2016.  Chest CT dated 09/07/2016. FINDINGS: The right jugular catheter is unchanged. Elevation of the right hemidiaphragm and right pleural fluid are unchanged. Mild left lower lobe atelectasis. Stable lower thoracic spine degenerative changes. IMPRESSION: 1. Stable elevated right hemidiaphragm and right pleural fluid. 2. Mild left lower lobe atelectasis. Electronically Signed   By: Claudie Revering M.D.   On: 09/14/2016 08:46   Dg Chest Port 1 View  Result Date: 09/13/2016 CLINICAL DATA:  Follow-up pleural effusion. EXAM: PORTABLE CHEST 1 VIEW COMPARISON:  09/12/2016 FINDINGS: Right internal jugular central line tip is in the SVC at the azygos level. The left lung remains clear except for mild left base atelectasis. Right lower and middle lobe collapse persists. Probable subpulmonic effusion on the right. IMPRESSION: Only mild residual left base atelectasis. Persistent collapse of the right lower and middle lobes, probably with right subpulmonic effusion. Electronically Signed   By: Nelson Chimes M.D.   On: 09/13/2016 07:29    Labs: BMET  Recent Labs Lab 09/09/16 2130 09/10/16 0356 09/11/16 0359 09/11/16 1700 09/12/16 0228 09/12/16 1600 09/13/16 0400  NA 135 137 139 140 139  139 136 138  K 4.2 4.5 4.1 4.4 4.0  4.0 4.1 4.0  CL 105  106 105 106 105  105 102 102  CO2 16* 18* 15* 18* '22  22 23 25  '$ GLUCOSE 410* 299* 205* 134* 197*  198* 227* 109*  BUN 80* 81* 102* 108* 77*  77* 54* 40*  CREATININE 7.05* 7.25* 7.91* 8.03* 5.37*  5.35* 3.71* 2.94*  CALCIUM 8.0* 8.0* 8.2* 7.8* 7.9*  7.8* 7.8* 8.0*  PHOS  --   --   --  6.4* 5.0*  4.9* 3.7 3.5   CBC  Recent Labs Lab 09/11/16 0359 09/12/16 0228 09/13/16 0400 09/14/16 0655  WBC  19.7* 20.7* 21.2* 16.7*  HGB 12.7* 12.1* 11.9* 10.6*  HCT 36.9* 34.8* 35.3* 31.7*  MCV 78.3 77.0* 78.6 79.6  PLT 292 229 245 237    Medications:    . aspirin EC  81 mg Oral Daily  . cefTRIAXone (ROCEPHIN)  IV  2 g Intravenous Q24H  . cholecalciferol  1,000 Units Oral Daily  . cloNIDine  0.3 mg Oral TID  . diltiazem  240 mg Oral Daily  . furosemide  40 mg Intravenous BID  . hydrocortisone sodium succinate  50 mg Intravenous Q6H  . insulin aspart  0-9 Units Subcutaneous TID AC & HS  . insulin aspart  5 Units Subcutaneous TID WC  . insulin glargine  40 Units Subcutaneous QHS  . levalbuterol  0.63 mg Nebulization BID  . pantoprazole  40 mg Oral Daily  . saccharomyces boulardii  250 mg Oral BID  . senna-docusate  1 tablet Oral QHS  . sodium bicarbonate  1,300 mg Oral TID   Elmarie Shiley, MD 09/14/2016, 10:18 AM

## 2016-09-14 NOTE — Progress Notes (Signed)
[facility] for Heparin Indication: atrial fibrillation  Allergies  Allergen Reactions  . Glyburide Other (See Comments)    unknown  . Methotrexate Derivatives Other (See Comments)    Patient cannot remember    Patient Measurements: Height: '6\' 1"'$  (185.4 cm) Weight: 248 lb 0.3 oz (112.5 kg) IBW/kg (Calculated) : 79.9 Heparin Dosing Weight: 103 kg  Vital Signs: Temp: 97.5 F (36.4 C) (01/13 0439) Temp Source: Oral (01/13 0439) BP: 142/57 (01/13 0439) Pulse Rate: 64 (01/13 0439)  Labs:  Recent Labs  09/12/16 0228 09/12/16 0823 09/12/16 1600 09/13/16 0400 09/14/16 0647 09/14/16 0655  HGB 12.1*  --   --  11.9*  --  10.6*  HCT 34.8*  --   --  35.3*  --  31.7*  PLT 229  --   --  245  --  237  HEPARINUNFRC 0.65 0.54  --  0.59 0.63  --   CREATININE 5.37*  5.35*  --  3.71* 2.94*  --   --     Estimated Creatinine Clearance: 32.5 mL/min (by C-G formula based on SCr of 2.94 mg/dL (H)).    Scheduled:  . aspirin EC  81 mg Oral Daily  . cefTRIAXone (ROCEPHIN)  IV  2 g Intravenous Q24H  . cholecalciferol  1,000 Units Oral Daily  . cloNIDine  0.3 mg Oral TID  . diltiazem  240 mg Oral Daily  . hydrocortisone sodium succinate  50 mg Intravenous Q6H  . insulin aspart  0-9 Units Subcutaneous TID AC & HS  . insulin aspart  5 Units Subcutaneous TID WC  . insulin glargine  40 Units Subcutaneous QHS  . levalbuterol  0.63 mg Nebulization BID  . pantoprazole  40 mg Oral Daily  . saccharomyces boulardii  250 mg Oral BID  . senna-docusate  1 tablet Oral QHS  . sodium bicarbonate  1,300 mg Oral TID   Infusions:  . heparin 1,600 Units/hr (09/14/16 0530)    Assessment: 67yo male with new onset Afib with RVR. Pharmacy is consulted to dose heparin. Heparin level remains therapeutic this morning at 0.63 on heparin 1600 units/hr. Hgb downtrending slowly, but stable. Platelets normal. No issues with infusion or s/s bleeding noted.  Goal of Therapy:   Heparin level 0.3-0.7 units/ml Monitor platelets by anticoagulation protocol: Yes   Plan:  Continue heparin gtt at 1600 units/hr Daily heparin level and CBC  Monitor s/s of bleeding  Argie Ramming, PharmD Pharmacy Resident  Pager [phone] 09/14/16 8:03 AM

## 2016-09-15 DIAGNOSIS — Z794 Long term (current) use of insulin: Secondary | ICD-10-CM

## 2016-09-15 LAB — CBC
HCT: 32.2 % — ABNORMAL LOW (ref 39.0–52.0)
Hemoglobin: 10.6 g/dL — ABNORMAL LOW (ref 13.0–17.0)
MCH: 26.4 pg (ref 26.0–34.0)
MCHC: 32.9 g/dL (ref 30.0–36.0)
MCV: 80.3 fL (ref 78.0–100.0)
Platelets: 273 10*3/uL (ref 150–400)
RBC: 4.01 MIL/uL — ABNORMAL LOW (ref 4.22–5.81)
RDW: 15.2 % (ref 11.5–15.5)
WBC: 15.7 10*3/uL — ABNORMAL HIGH (ref 4.0–10.5)

## 2016-09-15 LAB — RENAL FUNCTION PANEL
ALBUMIN: 1.6 g/dL — AB (ref 3.5–5.0)
Anion gap: 14 (ref 5–15)
BUN: 103 mg/dL — AB (ref 6–20)
CALCIUM: 7.8 mg/dL — AB (ref 8.9–10.3)
CO2: 21 mmol/L — AB (ref 22–32)
Chloride: 95 mmol/L — ABNORMAL LOW (ref 101–111)
Creatinine, Ser: 5.43 mg/dL — ABNORMAL HIGH (ref 0.61–1.24)
GFR calc Af Amer: 11 mL/min — ABNORMAL LOW (ref >60)
GFR calc non Af Amer: 10 mL/min — ABNORMAL LOW (ref >60)
GLUCOSE: 492 mg/dL — AB (ref 65–99)
PHOSPHORUS: 8.3 mg/dL — AB (ref 2.5–4.6)
Potassium: 4.4 mmol/L (ref 3.5–5.1)
SODIUM: 130 mmol/L — AB (ref 135–145)

## 2016-09-15 LAB — GLUCOSE, CAPILLARY
GLUCOSE-CAPILLARY: 463 mg/dL — AB (ref 65–99)
GLUCOSE-CAPILLARY: 469 mg/dL — AB (ref 65–99)
Glucose-Capillary: 443 mg/dL — ABNORMAL HIGH (ref 65–99)
Glucose-Capillary: 452 mg/dL — ABNORMAL HIGH (ref 65–99)

## 2016-09-15 LAB — MAGNESIUM: MAGNESIUM: 2.5 mg/dL — AB (ref 1.7–2.4)

## 2016-09-15 LAB — HEPARIN LEVEL (UNFRACTIONATED)
HEPARIN UNFRACTIONATED: 0.82 [IU]/mL — AB (ref 0.30–0.70)
Heparin Unfractionated: 0.93 IU/mL — ABNORMAL HIGH (ref 0.30–0.70)

## 2016-09-15 MED ORDER — HYDROCORTISONE NA SUCCINATE PF 100 MG IJ SOLR
50.0000 mg | Freq: Two times a day (BID) | INTRAMUSCULAR | Status: DC
  Administered 2016-09-15: 50 mg via INTRAVENOUS
  Filled 2016-09-15: qty 2

## 2016-09-15 MED ORDER — INSULIN ASPART 100 UNIT/ML ~~LOC~~ SOLN
10.0000 [IU] | Freq: Once | SUBCUTANEOUS | Status: AC
  Administered 2016-09-15: 10 [IU] via SUBCUTANEOUS

## 2016-09-15 MED ORDER — INSULIN GLARGINE 100 UNIT/ML ~~LOC~~ SOLN
10.0000 [IU] | Freq: Every day | SUBCUTANEOUS | Status: DC
Start: 2016-09-15 — End: 2016-09-15

## 2016-09-15 MED ORDER — SODIUM CHLORIDE 0.9% FLUSH: 10.0000 mL | INTRAVENOUS | Status: DC | PRN

## 2016-09-15 MED ORDER — INSULIN GLARGINE 100 UNIT/ML ~~LOC~~ SOLN
50.0000 [IU] | Freq: Every day | SUBCUTANEOUS | Status: DC
  Administered 2016-09-15 – 2016-09-24 (×10): 50 [IU] via SUBCUTANEOUS
  Filled 2016-09-15 (×11): qty 0.5

## 2016-09-15 NOTE — Progress Notes (Signed)
[place] for Heparin Indication: atrial fibrillation  Allergies  Allergen Reactions  . Glyburide Other (See Comments)    unknown  . Methotrexate Derivatives Other (See Comments)    Patient cannot remember   Patient Measurements: Height: '6\' 1"'$  (185.4 cm) Weight: 240 lb 8.4 oz (109.1 kg) IBW/kg (Calculated) : 79.9 Heparin Dosing Weight: 103 kg  Vital Signs: Temp: 97.5 F (36.4 C) (01/14 0444) Temp Source: Oral (01/14 0444) BP: 165/59 (01/14 0444) Pulse Rate: 84 (01/14 0444)  Labs:  Recent Labs  09/13/16 0400 09/14/16 0647 09/14/16 0655 09/14/16 1102 09/15/16 1319 09/15/16 1323  HGB 11.9*  --  10.6*  --   --  10.6*  HCT 35.3*  --  31.7*  --   --  32.2*  PLT 245  --  237  --   --  273  HEPARINUNFRC 0.59 0.63  --   --  0.82*  --   CREATININE 2.94* 4.64*  --  4.92*  --   --     Estimated Creatinine Clearance: 19.1 mL/min (by C-G formula based on SCr of 4.92 mg/dL (H)).    Scheduled:  . aspirin EC  81 mg Oral Daily  . cefTRIAXone (ROCEPHIN)  IV  2 g Intravenous Q24H  . cholecalciferol  1,000 Units Oral Daily  . cloNIDine  0.3 mg Oral TID  . diltiazem  240 mg Oral Daily  . furosemide  40 mg Intravenous BID  . hydrocortisone sodium succinate  50 mg Intravenous Q6H  . insulin aspart  0-9 Units Subcutaneous TID AC & HS  . insulin aspart  5 Units Subcutaneous TID WC  . insulin glargine  40 Units Subcutaneous QHS  . levalbuterol  0.63 mg Nebulization BID  . pantoprazole  40 mg Oral Daily  . saccharomyces boulardii  250 mg Oral BID  . senna-docusate  1 tablet Oral QHS  . sodium bicarbonate  1,300 mg Oral TID   Infusions:  . heparin 1,600 Units/hr (09/14/16 2215)    Assessment: 66yo male with new onset Afib with RVR. Pharmacy is consulted to dose heparin. Heparin level drawn late due to patient refusal of labs this morning. IV team consulted to draw heparin from dialysis cath pigtail. Heparin level is elevated at 0.82 on  heparin 1600 units/hr. Unable to get in touch with IV team RN who drew labs. Hgb low stable. Platelets normal. No s/s bleeding noted. Will opt for conservative reduction as lab was drawn differently today - from dialysis cath today by IV team, which resulted in first supratherapeutic level at this rate. Suspect possible contamination with heparin gtt/heparin lock used for dialysis cath.   Goal of Therapy:  Heparin level 0.3-0.7 units/ml Monitor platelets by anticoagulation protocol: Yes   Plan:  Decrease heparin gtt to 1500 units/hr Heparin level in 8 hours  Daily heparin level and CBC  Monitor s/s of bleeding  Argie Ramming, PharmD Pharmacy Resident  Pager [phone] 09/15/16 2:22 PM

## 2016-09-15 NOTE — Progress Notes (Signed)
Patient ID: Gary Lang, male   DOB: May 26, 1951, 66 y.o.   MRN: [id number] [facility] Progress Note   Assessment/ Plan:   1. 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 Transiently on CRRT for ultrafiltration after he developed acute dyspnea from pulmonary edema/worsening renal function and A. flutter with RVR. Oliguric urine output of 850 mL overnight without any obvious renal recovery. No acute indication for dialysis at this time. Will assess lab/clinical status again tomorrow to decide on need for dialysis-he has both a temporary right IJ dialysis catheter and a left radiocephalic fistula (mature) suitable for use. 2. Right lower lobe pneumonia/acute cholecystitis: On broad-spectrum antibiotic therapy and status post percutaneous cholecystostomy drain.  3. Hyponatremia/volume overload: Hyponatremia corrected-volume status improved with ultrafiltration, and compounded by hyperglycemia-continue close monitoring and reiterated limiting fluid intake to 1.5 L/day. 4. Hypertension: Improved with ultrafiltration/oral and hypertensive agents-we'll continue to monitor with ongoing diuretic therapy. 5. Hyperphosphatemia: Corrected with CRRT- continue to monitor off binders. 6. Atrial flutter with rapid ventricular response: Rate controlled with diltiazem-now back in sinus rhythm, remains on heparin drip Subjective:   Reports an uneventful night and bothered by repeat lab draws    Objective:   BP (!) 169/60 (BP Location: Right Leg)   Pulse 62   Temp 98.4 F (36.9 C) (Oral)   Resp 18   Ht '6\' 1"'$  (1.854 m)   Wt 109.1 kg (240 lb 8.4 oz)   SpO2 94%   BMI 31.73 kg/m   Intake/Output Summary (Last 24 hours) at 09/15/16 1116 Last data filed at 09/15/16 0844  Gross per 24 hour  Intake             1742 ml  Output              950 ml  Net              792 ml   Weight change: -3.41 kg (-7 lb 8.3 oz)  Physical  Exam: Gen: Comfortably resting in bed-awake and interactive CVS: Pulse Regular rhythm, normal rate  Resp: Clear to auscultation, no rales, right IJ TDC Abd: Soft, obese, nontender, RUQ drain in situ Ext: Trace lower extremity edema, left radiocephalic fistula with good thrill and bruit  Imaging: Dg Chest Port 1 View  Result Date: 09/14/2016 CLINICAL DATA:  Followup pleural effusion. EXAM: PORTABLE CHEST 1 VIEW COMPARISON:  09/13/2016.  Chest CT dated 09/07/2016. FINDINGS: The right jugular catheter is unchanged. Elevation of the right hemidiaphragm and right pleural fluid are unchanged. Mild left lower lobe atelectasis. Stable lower thoracic spine degenerative changes. IMPRESSION: 1. Stable elevated right hemidiaphragm and right pleural fluid. 2. Mild left lower lobe atelectasis. Electronically Signed   By: Claudie Revering M.D.   On: 09/14/2016 08:46    Labs: BMET  Recent Labs Lab 09/11/16 0359 09/11/16 1700 09/12/16 0228 09/12/16 1600 09/13/16 0400 09/14/16 0647 09/14/16 1102  NA 139 140 139  139 136 138 133* 133*  K 4.1 4.4 4.0  4.0 4.1 4.0 4.9 4.7  CL 105 106 105  105 102 102 100* 99*  CO2 15* 18* '22  22 23 25 23 23  '$ GLUCOSE 205* 134* 197*  198* 227* 109* 271* 342*  BUN 102* 108* 77*  77* 54* 40* 76* 81*  CREATININE 7.91* 8.03* 5.37*  5.35* 3.71* 2.94* 4.64* 4.92*  CALCIUM 8.2* 7.8* 7.9*  7.8* 7.8* 8.0* 7.7* 7.8*  PHOS  --  6.4* 5.0*  4.9* 3.7 3.5 6.7*  --    CBC  Recent Labs Lab 09/11/16 0359 09/12/16 0228 09/13/16 0400 09/14/16 0655  WBC 19.7* 20.7* 21.2* 16.7*  HGB 12.7* 12.1* 11.9* 10.6*  HCT 36.9* 34.8* 35.3* 31.7*  MCV 78.3 77.0* 78.6 79.6  PLT 292 229 245 237    Medications:    . aspirin EC  81 mg Oral Daily  . cefTRIAXone (ROCEPHIN)  IV  2 g Intravenous Q24H  . cholecalciferol  1,000 Units Oral Daily  . cloNIDine  0.3 mg Oral TID  . diltiazem  240 mg Oral Daily  . hydrocortisone sodium succinate  50 mg Intravenous Q6H  . insulin aspart  0-9  Units Subcutaneous TID AC & HS  . insulin aspart  5 Units Subcutaneous TID WC  . insulin glargine  40 Units Subcutaneous QHS  . pantoprazole  40 mg Oral Daily  . saccharomyces boulardii  250 mg Oral BID  . senna-docusate  1 tablet Oral QHS  . sodium bicarbonate  1,300 mg Oral TID   Elmarie Shiley, MD 09/15/2016, 11:16 AM

## 2016-09-15 NOTE — Progress Notes (Signed)
Patient's blood sugar trending upward,M,D, made aware.

## 2016-09-15 NOTE — Progress Notes (Signed)
PROGRESS NOTE  ZIAIRE HAGOS Lang:[id number] DOB: 12-Jan-1951 DOA: 09/05/2016 PCP: [name], MD  Brief Summary:  Brief:   66 y.o. 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  He was admitted 09/05/16 with  E.Coli bacteremia due to acute cholecystitis. He was seen by [facility] and had percutaneous drain placed 01/09 as [facility] felt he was not stable enough for full surgery. Since admission, he has had worsening in renal function and also developed new onset paroxysmal atrial fib. On 1/10 Due to worsening renal function started on CVVHD (prefered over HD due to HR).   SIGNIFICANT EVENTS: 1/04 > admit sepsis > e.coli bacteremia secondary to acute cholecystitis 1/07> Percutaneous drain placed by IR 1/10 > cvvhd, fib rvr  CULTURES: Blood 01/04 > E.Coli. Gallbladder fluid 01/07 > E.Coli, K.Pneumoniae.  ANTIBIOTICS: Azithro 01/04 > 01/10 Ceftriaxone 01/04 > 01/05, 1/11 >  Zosyn 01/04 >1/11  LINES/TUBES: RIJ Vascath 1/11 > both a temporary right IJ dialysis catheter and a left radiocephalic fistula (mature) suitable for use.  Patient was [facility] to [facility], transferred to ICU to have CRRT Back to [facility] service on 1/14   HPI/Recap of past 24 hours:  Elevated blood sugar, otherwise feeling better, denies pain, no sob, family at bedside assisting care  Assessment/Plan: Active Problems:   Community acquired pneumonia of right lower lobe of lung ([place])   Acute respiratory failure with hypoxia ([place])   Acute cholecystitis due to biliary calculus   AKI (acute kidney injury) ([place])   Abdominal distention, non-gaseous   Acute abdominal pain   Fever  #Sepsis due to E Coli bacteremia and possibly commonly acquired pneumonia/acute cholecystitis: Chest x-ray showed right lower lobe pneumonia. Patient with tachycardia, leukocytosis, fever and abnormal x-ray finding on admission -2 out of 2 blood cultures + Escherichia coli. Drain culture with   ESCHERICHIA COLI and KLEBSIELLA PNEUMONIAE --S/p perc cholecystostomy 09/08/16.per IR needsOP follow-up in IR clinic in 5-6 weeks -general surgery following for elective laparoscopic cholecystectomy   -sepsis resolving, taper steroids, finished 10days of iv abx, d/c abx on 1/14, repeat blood culture no growth    #Acute hypoxic respiratory failure likely in the setting of pneumonia and sepsis:  Improved, on room air. Encourage incentive spirometry   #Acute on chronic kidney disease, history of FSGN , has AVF in place,  temporary right IJ dialysis catheter -Patient required hemodialysis in the past and he has left upper extremity AV fistula. His underlying chronic kidney disease is from FSGS/diabetes and hypertension. Acute renal insufficiency appears to be associated with sepsis/ATN.  He he required CRRT due to afib/pulmonary edema.  -nephrology following  #Elevated troponin likely in the setting of sepsis/aki: Trend cardiac enzymes in the setting of new onset atrial fibrillation -Echocardiogram showed vigorous left ventricular systolic function with EF of 65-30%. There were no regional wall motion abnormalities. Also with  grade 1 diastolic dysfunction. Converted to sinus rhythm, currently on ccb/heparin drip( eventually Coumadin) Cardiology following.   Uncontrolled diabetes, insulin dependent Required insulin drip initially Now on Lantus, sliding scale insulin,  Blood sugar elevated, increase insulin, taper steroids a1c pending  HTN; on ccb. clonidine    DVT prophylaxis: Heparin drip, need to transition to coumadin if ok with surgery Code Status: Full code Family Communication: patient, daughter and son at bedside Disposition Plan: pending    Consultants:   Nephrology  Cardiology  critical care  General surgery  IR     Objective: BP Marland Kitchen)  169/60 (BP Location: Right Leg)   Pulse 62   Temp 98.4 F (36.9 C) (Oral)   Resp 18   Ht '6\' 1"'$  (1.854 m)    Wt 109.1 kg (240 lb 8.4 oz)   SpO2 94%   BMI 31.73 kg/m   Intake/Output Summary (Last 24 hours) at 09/15/16 1123 Last data filed at 09/15/16 0844  Gross per 24 hour  Intake             1742 ml  Output              950 ml  Net              792 ml   Filed Weights   09/14/16 0117 09/14/16 2130 09/15/16 0233  Weight: 112.5 kg (248 lb 0.3 oz) 109.1 kg (240 lb 8 oz) 109.1 kg (240 lb 8.4 oz)    Exam:   General:  NAD  Cardiovascular: RRR  Respiratory: CTABL  Abdomen: Soft/ND/NT, positive BS,  perc cholecystostomy in place  Musculoskeletal: No Edema  Neuro: aaox3  Data Reviewed: Basic Metabolic Panel:  Recent Labs Lab 09/09/16 0938  09/11/16 1700 09/12/16 0228 09/12/16 1600 09/13/16 0400 09/14/16 0647 09/14/16 1102  NA  --   < > 140 139  139 136 138 133* 133*  K  --   < > 4.4 4.0  4.0 4.1 4.0 4.9 4.7  CL  --   < > 106 105  105 102 102 100* 99*  CO2  --   < > 18* '22  22 23 25 23 23  '$ GLUCOSE  --   < > 134* 197*  198* 227* 109* 271* 342*  BUN  --   < > 108* 77*  77* 54* 40* 76* 81*  CREATININE  --   < > 8.03* 5.37*  5.35* 3.71* 2.94* 4.64* 4.92*  CALCIUM  --   < > 7.8* 7.9*  7.8* 7.8* 8.0* 7.7* 7.8*  MG 2.3  --   --  2.5*  --  2.5* 2.7*  --   PHOS  --   --  6.4* 5.0*  4.9* 3.7 3.5 6.7*  --   < > = values in this interval not displayed. Liver Function Tests:  Recent Labs Lab 09/09/16 0340 09/10/16 0356 09/11/16 1700 09/12/16 0228 09/12/16 1600 09/13/16 0400 09/14/16 0647  AST 57* 30  --  21  --   --   --   ALT 47 39  --  30  --   --   --   ALKPHOS 128* 111  --  95  --   --   --   BILITOT 1.2 1.1  --  1.6*  --   --   --   PROT 6.4* 6.2*  --  6.4*  --   --   --   ALBUMIN 1.7* 1.6* 1.7* 1.8*  1.7* 1.7* 1.8* 1.7*   No results for input(s): LIPASE, AMYLASE in the last 168 hours. No results for input(s): AMMONIA in the last 168 hours. CBC:  Recent Labs Lab 09/09/16 0340 09/11/16 0359 09/12/16 0228 09/13/16 0400 09/14/16 0655  WBC 11.4* 19.7*  20.7* 21.2* 16.7*  HGB 10.6* 12.7* 12.1* 11.9* 10.6*  HCT 32.2* 36.9* 34.8* 35.3* 31.7*  MCV 81.7 78.3 77.0* 78.6 79.6  PLT 180 292 229 245 237   Cardiac Enzymes:    Recent Labs Lab 09/09/16 0938 09/09/16 1547 09/09/16 2130  TROPONINI 0.04* 0.15* 0.04*   BNP (last 3 results)  Recent Labs  09/07/16 0719  BNP 101.0*    ProBNP (last 3 results) No results for input(s): PROBNP in the last 8760 hours.  CBG:  Recent Labs Lab 09/14/16 0751 09/14/16 1316 09/14/16 1614 09/14/16 2130 09/15/16 0828  GLUCAP 266* 350* 361* 369* 443*    Recent Results (from the past 240 hour(s))  Urine culture     Status: Abnormal   Collection Time: 09/05/16  1:10 PM  Result Value Ref Range Status   Specimen Description URINE, RANDOM  Final   Special Requests NONE  Final   Culture MULTIPLE SPECIES PRESENT, SUGGEST RECOLLECTION (A)  Final   Report Status 09/07/2016 FINAL  Final  Respiratory Panel by PCR     Status: None   Collection Time: 09/05/16  2:22 PM  Result Value Ref Range Status   Adenovirus NOT DETECTED NOT DETECTED Final   Coronavirus 229E NOT DETECTED NOT DETECTED Final   Coronavirus HKU1 NOT DETECTED NOT DETECTED Final   Coronavirus NL63 NOT DETECTED NOT DETECTED Final   Coronavirus OC43 NOT DETECTED NOT DETECTED Final   Metapneumovirus NOT DETECTED NOT DETECTED Final   Rhinovirus / Enterovirus NOT DETECTED NOT DETECTED Final   Influenza A NOT DETECTED NOT DETECTED Final   Influenza B NOT DETECTED NOT DETECTED Final   Parainfluenza Virus 1 NOT DETECTED NOT DETECTED Final   Parainfluenza Virus 2 NOT DETECTED NOT DETECTED Final   Parainfluenza Virus 3 NOT DETECTED NOT DETECTED Final   Parainfluenza Virus 4 NOT DETECTED NOT DETECTED Final   Respiratory Syncytial Virus NOT DETECTED NOT DETECTED Final   Bordetella pertussis NOT DETECTED NOT DETECTED Final   Chlamydophila pneumoniae NOT DETECTED NOT DETECTED Final   Mycoplasma pneumoniae NOT DETECTED NOT DETECTED Final     Comment: Performed at [facility]  Culture, blood (routine x 2)     Status: None   Collection Time: 09/07/16 11:58 AM  Result Value Ref Range Status   Specimen Description BLOOD RIGHT WRIST  Final   Special Requests BOTTLES DRAWN AEROBIC AND ANAEROBIC [place]  Final   Culture NO GROWTH 5 DAYS  Final   Report Status 09/12/2016 FINAL  Final  Culture, blood (routine x 2)     Status: None   Collection Time: 09/07/16 12:08 PM  Result Value Ref Range Status   Specimen Description BLOOD RIGHT HAND  Final   Special Requests BOTTLES DRAWN AEROBIC ONLY 4CC ONLY  Final   Culture NO GROWTH 5 DAYS  Final   Report Status 09/12/2016 FINAL  Final  Aerobic/Anaerobic Culture (surgical/deep wound)     Status: None   Collection Time: 09/08/16 12:11 PM  Result Value Ref Range Status   Specimen Description ABSCESS GALL BLADDER  Final   Special Requests Normal  Final   Gram Stain   Final    RARE WBC PRESENT,BOTH PMN AND MONONUCLEAR NO ORGANISMS SEEN    Culture   Final    MODERATE ESCHERICHIA COLI MODERATE KLEBSIELLA PNEUMONIAE NO ANAEROBES ISOLATED    Report Status 09/13/2016 FINAL  Final   Organism ID, Bacteria ESCHERICHIA COLI  Final   Organism ID, Bacteria KLEBSIELLA PNEUMONIAE  Final      Susceptibility   Escherichia coli - MIC*    AMPICILLIN >=32 RESISTANT Resistant     CEFAZOLIN <=4 SENSITIVE Sensitive     CEFEPIME <=1 SENSITIVE Sensitive     CEFTAZIDIME <=1 SENSITIVE Sensitive     CEFTRIAXONE <=1 SENSITIVE Sensitive     CIPROFLOXACIN <=0.25 SENSITIVE Sensitive  GENTAMICIN <=1 SENSITIVE Sensitive     IMIPENEM <=0.25 SENSITIVE Sensitive     TRIMETH/SULFA <=20 SENSITIVE Sensitive     AMPICILLIN/SULBACTAM 16 INTERMEDIATE Intermediate     PIP/TAZO <=4 SENSITIVE Sensitive     Extended ESBL NEGATIVE Sensitive     * MODERATE ESCHERICHIA COLI   Klebsiella pneumoniae - MIC*    AMPICILLIN 16 RESISTANT Resistant     CEFAZOLIN <=4 SENSITIVE Sensitive     CEFEPIME <=1 SENSITIVE  Sensitive     CEFTAZIDIME <=1 SENSITIVE Sensitive     CEFTRIAXONE <=1 SENSITIVE Sensitive     CIPROFLOXACIN <=0.25 SENSITIVE Sensitive     GENTAMICIN <=1 SENSITIVE Sensitive     IMIPENEM <=0.25 SENSITIVE Sensitive     TRIMETH/SULFA <=20 SENSITIVE Sensitive     AMPICILLIN/SULBACTAM <=2 SENSITIVE Sensitive     PIP/TAZO <=4 SENSITIVE Sensitive     Extended ESBL NEGATIVE Sensitive     * MODERATE KLEBSIELLA PNEUMONIAE  MRSA PCR Screening     Status: None   Collection Time: 09/09/16  4:49 PM  Result Value Ref Range Status   MRSA by PCR NEGATIVE NEGATIVE Final    Comment:        The GeneXpert MRSA Assay (FDA approved for NASAL specimens only), is one component of a comprehensive MRSA colonization surveillance program. It is not intended to diagnose MRSA infection nor to guide or monitor treatment for MRSA infections.      Studies: No results found.  Scheduled Meds: . aspirin EC  81 mg Oral Daily  . cefTRIAXone (ROCEPHIN)  IV  2 g Intravenous Q24H  . cholecalciferol  1,000 Units Oral Daily  . cloNIDine  0.3 mg Oral TID  . diltiazem  240 mg Oral Daily  . hydrocortisone sodium succinate  50 mg Intravenous Q6H  . insulin aspart  0-9 Units Subcutaneous TID AC & HS  . insulin aspart  5 Units Subcutaneous TID WC  . insulin glargine  40 Units Subcutaneous QHS  . pantoprazole  40 mg Oral Daily  . saccharomyces boulardii  250 mg Oral BID  . senna-docusate  1 tablet Oral QHS  . sodium bicarbonate  1,300 mg Oral TID    Continuous Infusions: . heparin 1,600 Units/hr (09/14/16 2215)     Time spent: 13 mins  Kase Shughart MD, PhD  [facility] Pager [phone]. If 7PM-7AM, please contact night-coverage at [email or web address], password [facility] 09/15/2016, 11:23 AM  LOS: 10 days

## 2016-09-15 NOTE — Progress Notes (Signed)
ANTICOAGULATION CONSULT NOTE - Follow Up Consult  Pharmacy Consult for heparin Indication: atrial fibrillation  Labs:  Recent Labs  09/13/16 0400 09/14/16 0647 09/14/16 0655 09/14/16 1102 09/15/16 1319 09/15/16 1322 09/15/16 1323 09/15/16 2205  HGB 11.9*  --  10.6*  --   --   --  10.6*  --   HCT 35.3*  --  31.7*  --   --   --  32.2*  --   PLT 245  --  237  --   --   --  273  --   HEPARINUNFRC 0.59 0.63  --   --  0.82*  --   --  0.93*  CREATININE 2.94* 4.64*  --  4.92*  --  5.43*  --   --     Assessment: 66yo male remains above goal on heparin despite rate decrease; RN reports that labs are being drawn from HD cath but heparin is running peripherally.  Goal of Therapy:  Heparin level 0.3-0.7 units/ml   Plan:  Will decrease heparin gtt by 2-3 units/kg/hr to 1250 units/hr and check level in [place], PharmD, BCPS  09/15/2016,10:47 PM

## 2016-09-16 DIAGNOSIS — J9601 Acute respiratory failure with hypoxia: Secondary | ICD-10-CM

## 2016-09-16 DIAGNOSIS — I480 Paroxysmal atrial fibrillation: Secondary | ICD-10-CM

## 2016-09-16 DIAGNOSIS — J181 Lobar pneumonia, unspecified organism: Secondary | ICD-10-CM

## 2016-09-16 DIAGNOSIS — K8000 Calculus of gallbladder with acute cholecystitis without obstruction: Secondary | ICD-10-CM

## 2016-09-16 DIAGNOSIS — A419 Sepsis, unspecified organism: Secondary | ICD-10-CM

## 2016-09-16 DIAGNOSIS — R748 Abnormal levels of other serum enzymes: Secondary | ICD-10-CM

## 2016-09-16 LAB — RENAL FUNCTION PANEL
Albumin: 1.7 g/dL — ABNORMAL LOW (ref 3.5–5.0)
Anion gap: 18 — ABNORMAL HIGH (ref 5–15)
BUN: 111 mg/dL — ABNORMAL HIGH (ref 6–20)
CHLORIDE: 95 mmol/L — AB (ref 101–111)
CO2: 19 mmol/L — ABNORMAL LOW (ref 22–32)
Calcium: 7.9 mg/dL — ABNORMAL LOW (ref 8.9–10.3)
Creatinine, Ser: 5.79 mg/dL — ABNORMAL HIGH (ref 0.61–1.24)
GFR, EST AFRICAN AMERICAN: 11 mL/min — AB (ref >60)
GFR, EST NON AFRICAN AMERICAN: 9 mL/min — AB (ref >60)
Glucose, Bld: 375 mg/dL — ABNORMAL HIGH (ref 65–99)
Phosphorus: 9 mg/dL — ABNORMAL HIGH (ref 2.5–4.6)
Potassium: 4.2 mmol/L (ref 3.5–5.1)
Sodium: 132 mmol/L — ABNORMAL LOW (ref 135–145)

## 2016-09-16 LAB — CBC
HEMATOCRIT: 30.9 % — AB (ref 39.0–52.0)
HEMOGLOBIN: 10.2 g/dL — AB (ref 13.0–17.0)
MCH: 26.4 pg (ref 26.0–34.0)
MCHC: 33 g/dL (ref 30.0–36.0)
MCV: 79.8 fL (ref 78.0–100.0)
Platelets: 263 10*3/uL (ref 150–400)
RBC: 3.87 MIL/uL — ABNORMAL LOW (ref 4.22–5.81)
RDW: 15 % (ref 11.5–15.5)
WBC: 15.4 10*3/uL — AB (ref 4.0–10.5)

## 2016-09-16 LAB — GLUCOSE, CAPILLARY
GLUCOSE-CAPILLARY: 325 mg/dL — AB (ref 65–99)
Glucose-Capillary: 279 mg/dL — ABNORMAL HIGH (ref 65–99)
Glucose-Capillary: 297 mg/dL — ABNORMAL HIGH (ref 65–99)
Glucose-Capillary: 258 mg/dL — ABNORMAL HIGH (ref 65–99)

## 2016-09-16 LAB — HEPARIN LEVEL (UNFRACTIONATED)
HEPARIN UNFRACTIONATED: 0.72 [IU]/mL — AB (ref 0.30–0.70)
HEPARIN UNFRACTIONATED: 0.32 [IU]/mL (ref 0.30–0.70)

## 2016-09-16 LAB — MAGNESIUM: Magnesium: 2.7 mg/dL — ABNORMAL HIGH (ref 1.7–2.4)

## 2016-09-16 MED ORDER — WARFARIN SODIUM 7.5 MG PO TABS
7.5000 mg | ORAL_TABLET | Freq: Once | ORAL | Status: AC
  Administered 2016-09-16: 7.5 mg via ORAL
  Filled 2016-09-16: qty 1

## 2016-09-16 MED ORDER — WARFARIN VIDEO
Freq: Once | Status: AC
  Administered 2016-09-16: 14:00:00

## 2016-09-16 MED ORDER — HYDROCORTISONE NA SUCCINATE PF 100 MG IJ SOLR: 50.0000 mg | Freq: Every day | INTRAMUSCULAR | Status: DC

## 2016-09-16 MED ORDER — WARFARIN - PHARMACIST DOSING INPATIENT
Freq: Every day | Status: DC
  Administered 2016-09-22: 19:00:00

## 2016-09-16 MED ORDER — COUMADIN BOOK
Freq: Once | Status: AC
  Administered 2016-09-16: 15:00:00
  Filled 2016-09-16: qty 1

## 2016-09-16 NOTE — Progress Notes (Signed)
ANTICOAGULATION CONSULT NOTE - Follow Up Consult  Pharmacy Consult for Heparin + Warfarin Indication: atrial fibrillation  Patient Measurements: Height: '6\' 1"'$  (185.4 cm) Weight: 243 lb 6.2 oz (110.4 kg) IBW/kg (Calculated) : 79.9 Heparin Dosing Weight: 103 kg  Labs:  Recent Labs  09/14/16 0655 09/14/16 1102 09/15/16 1319 09/15/16 1322 09/15/16 1323 09/15/16 2205 09/16/16 0458  HGB 10.6*  --   --   --  10.6*  --  10.2*  HCT 31.7*  --   --   --  32.2*  --  30.9*  PLT 237  --   --   --  273  --  263  HEPARINUNFRC  --   --  0.82*  --   --  0.93* 0.72*  CREATININE  --  4.92*  --  5.43*  --   --  5.79*    Estimated Creatinine Clearance: 16.3 mL/min (by C-G formula based on SCr of 5.79 mg/dL (H)).   Medications:  Heparin @ 1150 units/hr  Assessment: 65 YOM with new onset Afib with RVR. The patient was started on Heparin on 1/10. Oral anticoagulation was held pending possible surgical plans for cholecystitis. There is no immediate surgery planned so pharmacy has been consulted to add warfarin starting 1/15.   The patient's heparin drip was adjusted earlier this morning with repeat labs scheduled for later this afternoon. Warf points ~7. INR 1.11 on 1/7. CBC stable - no bleeding noted.   Goal of Therapy:  INR 2-3 Heparin level 0.3-0.7 units/ml Monitor platelets by anticoagulation protocol: Yes   Plan:  1. Continue heparin at the reduced rate of 1150 units/hr (11.5 ml/hr) 2. Will follow-up with ordered heparin level this afternoon for rate adjustments 3. Warfarin 7.5 mg x 1 dose at 1800 today 4. Warfarin book/video 5. Daily PT/INR, CBC q72h 6. Will continue to monitor for any signs/symptoms of bleeding and will follow up with PT/INR in the a.m.   Thank you for allowing pharmacy to be a part of this patient's care.  Alycia Rossetti, PharmD, BCPS Clinical Pharmacist Pager: [phone] Clinical phone for 09/16/2016 from 7a-3:30p: [phone] If after 3:30p, please call main  pharmacy at: [phone] 09/16/2016 1:18 PM

## 2016-09-16 NOTE — Care Management Important Message (Signed)
Important Message  Patient Details  Name: Gary Lang MRN: [id number] Date of Birth: 1951-05-27   Medicare Important Message Given:  Yes    [name] Montine Circle 09/16/2016, 4:49 PM

## 2016-09-16 NOTE — Progress Notes (Signed)
Progress Note: General Surgery Service   Subjective: Tolerating diet, +BM, no abdominal pain, slight nausea at times  Objective: Vital signs in last 24 hours: Temp:  [97.8 F (36.6 C)-98.2 F (36.8 C)] 98.2 F (36.8 C) (01/15 0521) Pulse Rate:  [58-77] 58 (01/15 0521) Resp:  [18] 18 (01/15 0521) BP: (155-163)/(51-63) 158/63 (01/15 0521) SpO2:  [95 %-98 %] 95 % (01/15 0521) Weight:  [110.4 kg (243 lb 6.2 oz)] 110.4 kg (243 lb 6.2 oz) (01/15 0159) Last BM Date: 09/15/16  Intake/Output from previous day: 01/14 0701 - 01/15 0700 In: 1122 [P.O.:960; I.V.:162] Out: 2476 [Urine:2375; Drains:100; Stool:1] Intake/Output this shift: No intake/output data recorded.  Lungs: CTAB  Cardiovascular: bradycardic  Abd: soft, with obesity, nontender  Extremities: no edema  Neuro: AOx4  Lab Results: CBC   Recent Labs  09/15/16 1323 09/16/16 0458  WBC 15.7* 15.4*  HGB 10.6* 10.2*  HCT 32.2* 30.9*  PLT 273 263   BMET  Recent Labs  09/15/16 1322 09/16/16 0458  NA 130* 132*  K 4.4 4.2  CL 95* 95*  CO2 21* 19*  GLUCOSE 492* 375*  BUN 103* 111*  CREATININE 5.43* 5.79*  CALCIUM 7.8* 7.9*   PT/INR No results for input(s): LABPROT, INR in the last 72 hours. ABG No results for input(s): PHART, HCO3 in the last 72 hours.  Invalid input(s): PCO2, PO2  Studies/Results:  Anti-infectives: Anti-infectives    Start     Dose/Rate Route Frequency Ordered Stop   09/12/16 1100  cefTRIAXone (ROCEPHIN) 2 g in dextrose 5 % 50 mL IVPB  Status:  Discontinued     2 g 100 mL/hr over 30 Minutes Intravenous Every 24 hours 09/12/16 1002 09/15/16 1757   09/11/16 1000  piperacillin-tazobactam (ZOSYN) IVPB 2.25 g  Status:  Discontinued     2.25 g 100 mL/hr over 30 Minutes Intravenous Every 6 hours 09/11/16 0945 09/12/16 1002   09/10/16 1200  cefTRIAXone (ROCEPHIN) 2 g in dextrose 5 % 50 mL IVPB  Status:  Discontinued     2 g 100 mL/hr over 30 Minutes Intravenous Every 24 hours 09/10/16  0901 09/11/16 0933   09/08/16 2000  piperacillin-tazobactam (ZOSYN) IVPB 2.25 g  Status:  Discontinued     2.25 g 100 mL/hr over 30 Minutes Intravenous Every 6 hours 09/08/16 1310 09/10/16 0901   09/06/16 1000  azithromycin (ZITHROMAX) tablet 500 mg  Status:  Discontinued     500 mg Oral Every 24 hours 09/05/16 1422 09/08/16 0831   09/06/16 0800  cefTRIAXone (ROCEPHIN) 1 g in dextrose 5 % 50 mL IVPB  Status:  Discontinued     1 g 100 mL/hr over 30 Minutes Intravenous Every 24 hours 09/05/16 1422 09/05/16 1940   09/05/16 2000  piperacillin-tazobactam (ZOSYN) IVPB 3.375 g  Status:  Discontinued     3.375 g 12.5 mL/hr over 240 Minutes Intravenous Every 8 hours 09/05/16 1948 09/08/16 1310   09/05/16 0845  cefTRIAXone (ROCEPHIN) 1 g in dextrose 5 % 50 mL IVPB     1 g 100 mL/hr over 30 Minutes Intravenous  Once 09/05/16 0836 09/05/16 0912   09/05/16 0845  azithromycin (ZITHROMAX) 500 mg in dextrose 5 % 250 mL IVPB     500 mg 250 mL/hr over 60 Minutes Intravenous  Once 09/05/16 0836 09/05/16 1017      Medications: Scheduled Meds: . aspirin EC  81 mg Oral Daily  . cholecalciferol  1,000 Units Oral Daily  . cloNIDine  0.3 mg Oral TID  .  diltiazem  240 mg Oral Daily  . hydrocortisone sodium succinate  50 mg Intravenous Q12H  . insulin aspart  0-9 Units Subcutaneous TID AC & HS  . insulin aspart  5 Units Subcutaneous TID WC  . insulin glargine  50 Units Subcutaneous QHS  . pantoprazole  40 mg Oral Daily  . saccharomyces boulardii  250 mg Oral BID  . senna-docusate  1 tablet Oral QHS  . sodium bicarbonate  1,300 mg Oral TID   Continuous Infusions: . heparin 1,250 Units/hr (09/15/16 2247)   PRN Meds:.alum & mag hydroxide-simeth, bisacodyl, diltiazem, fentaNYL (SUBLIMAZE) injection, heparin, levalbuterol, metoCLOPramide (REGLAN) injection, metoprolol, simethicone, sodium chloride flush  Assessment/Plan: Patient Active Problem List   Diagnosis Date Noted  . Acute abdominal pain   .  Fever   . Abdominal distention, non-gaseous   . Acute cholecystitis due to biliary calculus   . AKI (acute kidney injury) ([place])   . Acute respiratory failure with hypoxia ([place])   . Community acquired pneumonia of right lower lobe of lung ([place]) 09/05/2016  . Elevated troponin   . Sepsis ([place])   . Hypokalemia   . Erectile dysfunction 02/01/2014  . Rash and nonspecific skin eruption 11/04/2013  . Esophageal reflux 11/04/2013  . End stage renal disease ([place]) 09/23/2013  . AVF (arteriovenous fistula) ([place]) 08/17/2013  . Glomerulonephritis, focal sclerosing 07/06/2013  . Diabetes mellitus ([place]) 02/07/2012  . Hypertension 09/02/2008  . Rheumatoid arthritis(714.0) 09/02/1996   s/p  Perc cholecystostomy -cholecystitis appears to have resolved -continue reg diet -continue drain care -no planned surgical intervention this hospitalization, can resume OACs from gen surgery standpoint. -f/u with Dr. Georgette Dover in 1 month, preferably after tube interrogation with radiology -call for new issues    LOS: 11 days   Mickeal Skinner, MD Pg# [phone] [facility]

## 2016-09-16 NOTE — Progress Notes (Signed)
ANTICOAGULATION CONSULT NOTE - Follow Up Consult  Pharmacy Consult for Heparin Indication: atrial fibrillation  Patient Measurements: Height: '6\' 1"'$  (185.4 cm) Weight: 243 lb 6.2 oz (110.4 kg) IBW/kg (Calculated) : 79.9 Heparin Dosing Weight: 103 kg  Labs:  Recent Labs  09/14/16 0655 09/14/16 1102  09/15/16 1322 09/15/16 1323 09/15/16 2205 09/16/16 0458 09/16/16 1820  HGB 10.6*  --   --   --  10.6*  --  10.2*  --   HCT 31.7*  --   --   --  32.2*  --  30.9*  --   PLT 237  --   --   --  273  --  263  --   HEPARINUNFRC  --   --   < >  --   --  0.93* 0.72* 0.32  CREATININE  --  4.92*  --  5.43*  --   --  5.79*  --   < > = values in this interval not displayed.  Estimated Creatinine Clearance: 16.3 mL/min (by C-G formula based on SCr of 5.79 mg/dL (H)).   Medications:  Heparin @ 1150 units/hr  Assessment: 65 YOM with new onset Afib with RVR. The patient was started on Heparin on 1/10. Oral anticoagulation was held pending possible surgical plans for cholecystitis- oral AC has now been resumed.  Heparin level this evening therapeutic at 0.32 units/mL after a rate decrease this morning. Received warfarin 7.'5mg'$  this evening.  Goal of Therapy:  INR 2-3 Heparin level 0.3-0.7 units/ml Monitor platelets by anticoagulation protocol: Yes   Plan:  - Continue heparin at 1150 units/hr (11.5 ml/hr) - Daily heparin level, CBC  Thank you for allowing pharmacy to be a part of this patient's care.  Zylie Mumaw D. Luchiano Viscomi, PharmD, BCPS Clinical Pharmacist Pager: [phone] 09/16/2016 7:32 PM

## 2016-09-16 NOTE — Progress Notes (Signed)
Inpatient Diabetes Program Recommendations  AACE/ADA: New Consensus Statement on Inpatient Glycemic Control (2015)  Target Ranges:  Prepandial:   less than 140 mg/dL      Peak postprandial:   less than 180 mg/dL (1-2 hours)      Critically ill patients:  140 - 180 mg/dL   Lab Results  Component Value Date   GLUCAP 279 (H) 09/16/2016   HGBA1C 11.4 01/31/2014    Review of Glycemic Control Results for Gary Lang, Gary Lang (MRN [id number]) as of 09/16/2016 13:53  Ref. Range 09/15/2016 11:51 09/15/2016 17:50 09/15/2016 21:00 09/16/2016 08:14 09/16/2016 11:41  Glucose-Capillary Latest Ref Range: 65 - 99 mg/dL 463 (H) 452 (H) 469 (H) 325 (H) 279 (H)    Inpatient Diabetes Program Recommendations:  Noted hyperglycemia. Please consider increase in Novolog meal coverage tid to 7 units tid if eats 50%.  Thank you, Nani Gasser. Tenya Araque, RN, MSN, CDE Inpatient Glycemic Control Team Team Pager [phone] (8am-5pm) 09/16/2016 1:54 PM

## 2016-09-16 NOTE — Progress Notes (Signed)
PROGRESS NOTE  Gary Lang ZWC:[id number] DOB: 1951/05/17 DOA: 09/05/2016 PCP: [name], MD  Brief Summary:  Brief:   66 y.o. 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  He was admitted 09/05/16 with  E.Coli bacteremia due to acute cholecystitis. He was seen by [facility] and had percutaneous drain placed 01/09 as [facility] felt he was not stable enough for full surgery. Since admission, he has had worsening in renal function and also developed new onset paroxysmal atrial fib. On 1/10 Due to worsening renal function started on CVVHD (prefered over HD due to HR).   SIGNIFICANT EVENTS: 1/04 > admit sepsis > e.coli bacteremia secondary to acute cholecystitis 1/07> Percutaneous drain placed by IR 1/10 > cvvhd, fib rvr  CULTURES: Blood 01/04 > E.Coli. Gallbladder fluid 01/07 > E.Coli, K.Pneumoniae.  ANTIBIOTICS: Azithro 01/04 > 01/10 Ceftriaxone 01/04 > 01/05, 1/11 >  Zosyn 01/04 >1/11  LINES/TUBES: RIJ Vascath 1/11 > both a temporary right IJ dialysis catheter and a left radiocephalic fistula (mature) suitable for use.  Patient was admitted to [facility], transferred to ICU to have CRRT Back to [facility] service on 1/14   HPI/Recap of past 24 hours:  Report feeling tired today, denies pain, no sob,   Assessment/Plan: Active Problems:   Community acquired pneumonia of right lower lobe of lung ([place])   Acute respiratory failure with hypoxia ([place])   Acute cholecystitis due to biliary calculus   AKI (acute kidney injury) ([place])   Abdominal distention, non-gaseous   Acute abdominal pain   Fever  #Sepsis due to E Coli bacteremia and possibly commonly acquired pneumonia/acute cholecystitis: Chest x-ray showed right lower lobe pneumonia. Patient with tachycardia, leukocytosis, fever and abnormal x-ray finding on admission -2 out of 2 blood cultures + Escherichia coli. Drain culture with  ESCHERICHIA COLI and KLEBSIELLA PNEUMONIAE --S/p perc  cholecystostomy 09/08/16.per IR needsOP follow-up in IR clinic in 5-6 weeks -general surgery oked to start coumadin, patient is to follow up with general surgery in a few weeks for  elective laparoscopic cholecystectomy   -sepsis resolving, taper steroids, finished 10days of iv abx, d/c abx on 1/14, repeat blood culture no growth     #Acute hypoxic respiratory failure likely in the setting of pneumonia and sepsis:  Finished abx treatment on 1/14, Improved, on room air. Encourage incentive spirometry   #Acute on chronic kidney disease, history of FSGN , has AVF in place,  temporary right IJ dialysis catheter placed on admission and removed on 1/15 -Patient required hemodialysis in the past and he has left upper extremity AV fistula. His underlying chronic kidney disease is from FSGS/diabetes and hypertension. Acute renal insufficiency appears to be associated with sepsis/ATN.  He he required CRRT due to afib/pulmonary edema.  -good urine output, awaiting renal recovery, trend cr, temporary right IJ to be removed per nephrology,  will follow nephrology recommendation.  #Elevated troponin likely in the setting of sepsis/aki: Trend cardiac enzymes in the setting of new onset atrial fibrillation -Echocardiogram showed vigorous left ventricular systolic function with EF of 65-30%. There were no regional wall motion abnormalities. Also with  grade 1 diastolic dysfunction. Converted to sinus rhythm, currently on ccb Both general surgery and cardiology oked with transition from heparin drip to  Coumadin on 1/15 Cardiology signed off on 1/15, outpatient follow up recommended  Uncontrolled diabetes, insulin dependent Required insulin drip initially Now on Lantus, sliding scale insulin,  Blood sugar elevated, increase insulin, taper steroids a1c  pending  HTN; on ccb. clonidine    DVT prophylaxis: Heparin drip, transition to coumadin on 1/15 Code Status: Full code Family Communication:  patient,  Disposition Plan: pending, need nephrology clearance    Consultants:   Nephrology  Cardiology  critical care  General surgery  IR     Objective: BP (!) 158/63 (BP Location: Right Leg)   Pulse (!) 58   Temp 98.2 F (36.8 C) (Oral)   Resp 18   Ht '6\' 1"'$  (1.854 m)   Wt 110.4 kg (243 lb 6.2 oz)   SpO2 95%   BMI 32.11 kg/m   Intake/Output Summary (Last 24 hours) at 09/16/16 0749 Last data filed at 09/16/16 0600  Gross per 24 hour  Intake          1121.96 ml  Output             2476 ml  Net         -1354.04 ml   Filed Weights   09/15/16 0233 09/15/16 2107 09/16/16 0159  Weight: 109.1 kg (240 lb 8.4 oz) 110.4 kg (243 lb 6.2 oz) 110.4 kg (243 lb 6.2 oz)    Exam:   General:  NAD  Cardiovascular: RRR  Respiratory: CTABL  Abdomen: Soft/ND/NT, positive BS,  perc cholecystostomy in place  Musculoskeletal: No Edema  Neuro: aaox3  Data Reviewed: Basic Metabolic Panel:  Recent Labs Lab 09/12/16 0228 09/12/16 1600 09/13/16 0400 09/14/16 0647 09/14/16 1102 09/15/16 1322 09/15/16 1323 09/16/16 0458  NA 139  139 136 138 133* 133* 130*  --  132*  K 4.0  4.0 4.1 4.0 4.9 4.7 4.4  --  4.2  CL 105  105 102 102 100* 99* 95*  --  95*  CO2 '22  22 23 25 23 23 '$ 21*  --  19*  GLUCOSE 197*  198* 227* 109* 271* 342* 492*  --  375*  BUN 77*  77* 54* 40* 76* 81* 103*  --  111*  CREATININE 5.37*  5.35* 3.71* 2.94* 4.64* 4.92* 5.43*  --  5.79*  CALCIUM 7.9*  7.8* 7.8* 8.0* 7.7* 7.8* 7.8*  --  7.9*  MG 2.5*  --  2.5* 2.7*  --   --  2.5* 2.7*  PHOS 5.0*  4.9* 3.7 3.5 6.7*  --  8.3*  --  9.0*   Liver Function Tests:  Recent Labs Lab 09/10/16 0356  09/12/16 0228 09/12/16 1600 09/13/16 0400 09/14/16 0647 09/15/16 1322 09/16/16 0458  AST 30  --  21  --   --   --   --   --   ALT 39  --  30  --   --   --   --   --   ALKPHOS 111  --  95  --   --   --   --   --   BILITOT 1.1  --  1.6*  --   --   --   --   --   PROT 6.2*  --  6.4*  --   --   --    --   --   ALBUMIN 1.6*  < > 1.8*  1.7* 1.7* 1.8* 1.7* 1.6* 1.7*  < > = values in this interval not displayed. No results for input(s): LIPASE, AMYLASE in the last 168 hours. No results for input(s): AMMONIA in the last 168 hours. CBC:  Recent Labs Lab 09/12/16 0228 09/13/16 0400 09/14/16 0655 09/15/16 1323 09/16/16 0458  WBC 20.7* 21.2* 16.7*  15.7* 15.4*  HGB 12.1* 11.9* 10.6* 10.6* 10.2*  HCT 34.8* 35.3* 31.7* 32.2* 30.9*  MCV 77.0* 78.6 79.6 80.3 79.8  PLT 229 245 237 273 263   Cardiac Enzymes:    Recent Labs Lab 09/09/16 0938 09/09/16 1547 09/09/16 2130  TROPONINI 0.04* 0.15* 0.04*   BNP (last 3 results)  Recent Labs  09/07/16 0719  BNP 101.0*    ProBNP (last 3 results) No results for input(s): PROBNP in the last 8760 hours.  CBG:  Recent Labs Lab 09/14/16 2130 09/15/16 0828 09/15/16 1151 09/15/16 1750 09/15/16 2100  GLUCAP 369* 443* 463* 452* 469*    Recent Results (from the past 240 hour(s))  Culture, blood (routine x 2)     Status: None   Collection Time: 09/07/16 11:58 AM  Result Value Ref Range Status   Specimen Description BLOOD RIGHT WRIST  Final   Special Requests BOTTLES DRAWN AEROBIC AND ANAEROBIC [place]  Final   Culture NO GROWTH 5 DAYS  Final   Report Status 09/12/2016 FINAL  Final  Culture, blood (routine x 2)     Status: None   Collection Time: 09/07/16 12:08 PM  Result Value Ref Range Status   Specimen Description BLOOD RIGHT HAND  Final   Special Requests BOTTLES DRAWN AEROBIC ONLY 4CC ONLY  Final   Culture NO GROWTH 5 DAYS  Final   Report Status 09/12/2016 FINAL  Final  Aerobic/Anaerobic Culture (surgical/deep wound)     Status: None   Collection Time: 09/08/16 12:11 PM  Result Value Ref Range Status   Specimen Description ABSCESS GALL BLADDER  Final   Special Requests Normal  Final   Gram Stain   Final    RARE WBC PRESENT,BOTH PMN AND MONONUCLEAR NO ORGANISMS SEEN    Culture   Final    MODERATE ESCHERICHIA  COLI MODERATE KLEBSIELLA PNEUMONIAE NO ANAEROBES ISOLATED    Report Status 09/13/2016 FINAL  Final   Organism ID, Bacteria ESCHERICHIA COLI  Final   Organism ID, Bacteria KLEBSIELLA PNEUMONIAE  Final      Susceptibility   Escherichia coli - MIC*    AMPICILLIN >=32 RESISTANT Resistant     CEFAZOLIN <=4 SENSITIVE Sensitive     CEFEPIME <=1 SENSITIVE Sensitive     CEFTAZIDIME <=1 SENSITIVE Sensitive     CEFTRIAXONE <=1 SENSITIVE Sensitive     CIPROFLOXACIN <=0.25 SENSITIVE Sensitive     GENTAMICIN <=1 SENSITIVE Sensitive     IMIPENEM <=0.25 SENSITIVE Sensitive     TRIMETH/SULFA <=20 SENSITIVE Sensitive     AMPICILLIN/SULBACTAM 16 INTERMEDIATE Intermediate     PIP/TAZO <=4 SENSITIVE Sensitive     Extended ESBL NEGATIVE Sensitive     * MODERATE ESCHERICHIA COLI   Klebsiella pneumoniae - MIC*    AMPICILLIN 16 RESISTANT Resistant     CEFAZOLIN <=4 SENSITIVE Sensitive     CEFEPIME <=1 SENSITIVE Sensitive     CEFTAZIDIME <=1 SENSITIVE Sensitive     CEFTRIAXONE <=1 SENSITIVE Sensitive     CIPROFLOXACIN <=0.25 SENSITIVE Sensitive     GENTAMICIN <=1 SENSITIVE Sensitive     IMIPENEM <=0.25 SENSITIVE Sensitive     TRIMETH/SULFA <=20 SENSITIVE Sensitive     AMPICILLIN/SULBACTAM <=2 SENSITIVE Sensitive     PIP/TAZO <=4 SENSITIVE Sensitive     Extended ESBL NEGATIVE Sensitive     * MODERATE KLEBSIELLA PNEUMONIAE  MRSA PCR Screening     Status: None   Collection Time: 09/09/16  4:49 PM  Result Value Ref Range Status   MRSA  by PCR NEGATIVE NEGATIVE Final    Comment:        The GeneXpert MRSA Assay (FDA approved for NASAL specimens only), is one component of a comprehensive MRSA colonization surveillance program. It is not intended to diagnose MRSA infection nor to guide or monitor treatment for MRSA infections.      Studies: No results found.  Scheduled Meds: . aspirin EC  81 mg Oral Daily  . cholecalciferol  1,000 Units Oral Daily  . cloNIDine  0.3 mg Oral TID  . diltiazem   240 mg Oral Daily  . hydrocortisone sodium succinate  50 mg Intravenous Q12H  . insulin aspart  0-9 Units Subcutaneous TID AC & HS  . insulin aspart  5 Units Subcutaneous TID WC  . insulin glargine  50 Units Subcutaneous QHS  . pantoprazole  40 mg Oral Daily  . saccharomyces boulardii  250 mg Oral BID  . senna-docusate  1 tablet Oral QHS  . sodium bicarbonate  1,300 mg Oral TID    Continuous Infusions: . heparin 1,250 Units/hr (09/15/16 2247)     Time spent: 25 mins  Jazzalyn Loewenstein MD, PhD  [facility] Pager [phone]. If 7PM-7AM, please contact night-coverage at [email or web address], password [facility] 09/16/2016, 7:49 AM  LOS: 11 days

## 2016-09-16 NOTE — Progress Notes (Signed)
Physical Therapy Treatment Patient Details Name: Gary Lang MRN: [id number] DOB: 1951-07-20 Today's Date: 09/16/2016    History of Present Illness pt presents from outside hospital with PNA, acute on chronic kidney disease, and Cholecystitis now s/p drain placement.  pt with hx of CKD, DM, HTN, and RA.      PT Comments    Now can start to progress.  Emphasized exercise, standing tolerance and transfers.  Pt deferred gait. Goals updated.  Follow Up Recommendations  Home health PT;Supervision/Assistance - 24 hour     Equipment Recommendations  Rolling walker with 5" wheels;3in1 (PT)    Recommendations for Other Services       Precautions / Restrictions Precautions Precautions: Fall Restrictions Weight Bearing Restrictions: No    Mobility  Bed Mobility Overal bed mobility: Needs Assistance Bed Mobility: Supine to Sit     Supine to sit: Min assist     General bed mobility comments: minimal assist to come up to L elbow.  cues for sequencing.  Transfers Overall transfer level: Needs assistance Equipment used: Rolling walker (2 wheeled) Transfers: Sit to/from [facility] Sit to Stand: Min assist;+2 safety/equipment Stand pivot transfers: Min assist;+2 safety/equipment       General transfer comment: pt able to pivot in the RW with stability assist only with light use of the RW  Ambulation/Gait             General Gait Details: min assist pivotal steps over 3 foot distance.   Stairs            Wheelchair Mobility    Modified Rankin (Stroke Patients Only)       Balance Overall balance assessment: Needs assistance Sitting-balance support: No upper extremity supported;Feet supported Sitting balance-Leahy Scale: Fair     Standing balance support: Bilateral upper extremity supported;Single extremity supported;During functional activity Standing balance-Leahy Scale: Poor Standing balance comment: reliant on RW                     Cognition Arousal/Alertness: Awake/alert Behavior During Therapy: WFL for tasks assessed/performed Overall Cognitive Status: Within Functional Limits for tasks assessed                      Exercises General Exercises - Lower Extremity Heel Slides: AROM;Strengthening;Both;10 reps;Supine (graded resistance) Hip ABduction/ADduction: AROM;Both;10 reps;Supine Other Exercises Other Exercises: bicep/tricep presses bil.  x 10 reps    General Comments        Pertinent Vitals/Pain Pain Assessment: Faces Faces Pain Scale: Hurts a little bit Pain Location: knee R Pain Descriptors / Indicators: Sore Pain Intervention(s): Monitored during session    Home Living                      Prior Function            PT Goals (current goals can now be found in the care plan section) Acute Rehab PT Goals Patient Stated Goal: Pt wants to go home.  PT Goal Formulation: With patient Time For Goal Achievement: 09/23/16 Potential to Achieve Goals: Good Progress towards PT goals: Progressing toward goals    Frequency    Min 3X/week      PT Plan Current plan remains appropriate    Co-evaluation             End of Session           Time: 2951-8841 PT Time Calculation (min) (ACUTE ONLY): 23 min  Charges:  $  Therapeutic Exercise: 8-22 mins $Therapeutic Activity: 8-22 mins                    G Codes:      Tessie Fass [place] 09/16/2016, 3:45 PM 09/16/2016  Donnella Sham, [place] [phone]  (pager)

## 2016-09-16 NOTE — Progress Notes (Signed)
Patient Name: Gary Lang Date of Encounter: 09/16/2016  Primary Cardiologist: Dr. [facility] Problem List     Active Problems:   Community acquired pneumonia of right lower lobe of lung ([place])   Acute respiratory failure with hypoxia ([place])   Acute cholecystitis due to biliary calculus   AKI (acute kidney injury) ([place])   Abdominal distention, non-gaseous   Acute abdominal pain   Fever    Patient Profile     66 y/o male with h/o CKD, RA, DM and HTN, who presented on 09/05/16 with complaint of right sided chest and RUQ abdominal pain, fever and chills, found to have right sided PNA and acute cholecystitis, now s/p percutaneous cholecystostomy. Also being treated for Sepsis due to E coli bacteremia. Hospitalization complicated by new onset atrial fibrillation.    Subjective   Pt is feeling better. No chest pain or shortness of breath. He is now eating and has had mild indigestion. He has not been up out of bed yet.  Inpatient Medications    Scheduled Meds: . aspirin EC  81 mg Oral Daily  . cholecalciferol  1,000 Units Oral Daily  . cloNIDine  0.3 mg Oral TID  . diltiazem  240 mg Oral Daily  . hydrocortisone sodium succinate  50 mg Intravenous Q12H  . insulin aspart  0-9 Units Subcutaneous TID AC & HS  . insulin aspart  5 Units Subcutaneous TID WC  . insulin glargine  50 Units Subcutaneous QHS  . pantoprazole  40 mg Oral Daily  . saccharomyces boulardii  250 mg Oral BID  . senna-docusate  1 tablet Oral QHS  . sodium bicarbonate  1,300 mg Oral TID   Continuous Infusions: . heparin 1,150 Units/hr (09/16/16 1001)   PRN Meds: alum & mag hydroxide-simeth, bisacodyl, diltiazem, fentaNYL (SUBLIMAZE) injection, heparin, levalbuterol, metoCLOPramide (REGLAN) injection, metoprolol, simethicone, sodium chloride flush   Vital Signs    Vitals:   09/15/16 2107 09/16/16 0159 09/16/16 0521 09/16/16 0958  BP: (!) 163/51  (!) 158/63 (!) 162/62  Pulse: 60  (!) 58 65  Resp:  '18  18 16  '$ Temp: 97.8 F (36.6 C)  98.2 F (36.8 C) 97.6 F (36.4 C)  TempSrc: Oral  Oral Oral  SpO2: 98%  95% 94%  Weight: 243 lb 6.2 oz (110.4 kg) 243 lb 6.2 oz (110.4 kg)    Height:        Intake/Output Summary (Last 24 hours) at 09/16/16 1136 Last data filed at 09/16/16 1122  Gross per 24 hour  Intake          1241.96 ml  Output             2475 ml  Net         -1233.04 ml   Filed Weights   09/15/16 0233 09/15/16 2107 09/16/16 0159  Weight: 240 lb 8.4 oz (109.1 kg) 243 lb 6.2 oz (110.4 kg) 243 lb 6.2 oz (110.4 kg)    Physical Exam   GEN: Well nourished, well developed, in no acute distress.  HEENT: Grossly normal.  Neck: Supple, no JVD, carotid bruits, or masses. Cardiac: RRR, no murmurs, rubs, or gallops. No clubbing and cyanosis, Trace pedal edema.  Radials/DP/PT 2+ and equal bilaterally.  Respiratory:  Respirations regular, lungs clear bilaterally with no wheezes or rales, diminished in the right base GI:  Abdomen soft, active BS drainage tube noted in RUQ MS: no deformity or atrophy. Skin: warm and dry, no rash. Neuro:  Strength  and sensation are intact. Psych: AAOx3.  Normal affect.  Labs    CBC  Recent Labs  09/15/16 1323 09/16/16 0458  WBC 15.7* 15.4*  HGB 10.6* 10.2*  HCT 32.2* 30.9*  MCV 80.3 79.8  PLT 273 614   Basic Metabolic Panel  Recent Labs  09/15/16 1322 09/15/16 1323 09/16/16 0458  NA 130*  --  132*  K 4.4  --  4.2  CL 95*  --  95*  CO2 21*  --  19*  GLUCOSE 492*  --  375*  BUN 103*  --  111*  CREATININE 5.43*  --  5.79*  CALCIUM 7.8*  --  7.9*  MG  --  2.5* 2.7*  PHOS 8.3*  --  9.0*   Liver Function Tests  Recent Labs  09/15/16 1322 09/16/16 0458  ALBUMIN 1.6* 1.7*   No results for input(s): LIPASE, AMYLASE in the last 72 hours. Cardiac Enzymes No results for input(s): CKTOTAL, CKMB, CKMBINDEX, TROPONINI in the last 72 hours. BNP Invalid input(s): POCBNP D-Dimer No results for input(s): DDIMER in the last 72  hours. Hemoglobin A1C No results for input(s): HGBA1C in the last 72 hours. Fasting Lipid Panel No results for input(s): CHOL, HDL, LDLCALC, TRIG, CHOLHDL, LDLDIRECT in the last 72 hours. Thyroid Function Tests No results for input(s): TSH, T4TOTAL, T3FREE, THYROIDAB in the last 72 hours.  Invalid input(s): FREET3  Telemetry    Sinus rhytm in the 50's-60's - Personally Reviewed  EKG- No new tracings  09/11/16 0529- Atrial fibrillation at 133 bpm 09/11/16 0939- sinus tachycardia 107 bpm 09/11/16 1037- Atrial flutter, 2:1 conduction 152 bpm  Radiology    No results found.  Cardiac Studies   2D Echo 09/09/16  Study Conclusions  - Left ventricle: The cavity size was normal. Wall thickness was   increased in a pattern of moderate LVH. Systolic function was   vigorous. The estimated ejection fraction was in the range of 65%   to 70%. Wall motion was normal; there were no regional wall   motion abnormalities. Doppler parameters are consistent with   abnormal left ventricular relaxation (grade 1 diastolic   dysfunction). - Aortic valve: Valve area (VTI): 2.67 cm^2. Valve area (Vmax):   2.21 cm^2. Valve area (Vmean): 2.53 cm^2. - Atrial septum: No defect or patent foramen ovale was identified. - Pulmonary arteries: Systolic pressure was moderately increased.   PA peak pressure: 40 mm Hg (S). - Technically adequate study.  Patient Profile     66 y/o male with h/o CKD, RA, DM and HTN, who presented on 09/05/16 with complaint of right sided chest and RUQ abdominal pain, fever and chills, found to have right sided PNA and acute cholecystitis, now s/p percutaneous cholecystostomy. Also being treated for Sepsis due to E coli bacteremia. Hospitalization complicated by new onset atrial fibrillation.   Assessment & Plan    1. Paroxysmal Atrial Fibrillation -pt notes long history of having an irregular heart beat, but no previously documented reports of atrial fibrillation/flutter. New  diagnosis of atrial fibrillation noted this admission, in the setting of acute right sided CAP and acute cholecystitis. He is being treated conservatively with antibiotics and percutaneous chole drain (not a current surgical candidate for lap chole, at this time, given active PNA).  -Several days of adjusting cardizem and is now maintaining sinus rhythm on 240 mg daily -SBP 150's-160's -2D echo shows normal LVEF and normal wall motion. -Continue to treat underlying medical illnesses and monitor on telemetry. -No ischemic CP  to suggest underlying CAD. No plans for inpatient ischemic w/u at this time.  -His CHA2DS2 VASc score is at least 3 for HTN, DM and age 42-74. Given CKD, his options are limited to Coumadin. Surgeon has no plans for surgery this admission. Plans to discharge patient and consider surgery later when pt fully recovered. OK to initiate coumadin per surgery.  INR goal of 2-3. Will place pharmacy consult for coumadin.  2. Right lower lobe pneumonia/acute cholecystitis: On broad-spectrum antibiotic therapy and status post percutaneous cholecystostomy drain.   3. Volume overload -Per nephrology- secondary to free water excretion defect associated with acute renal insufficiency. Improved with ultrafiltration on CRRT. Creatinine has plateaued and hopefully recovering more.  -Pt breathing better   Daune Perch, NP  09/16/2016, 11:36 AM

## 2016-09-16 NOTE — Progress Notes (Signed)
Patient ID: Gary Lang, male   DOB: 1950-11-16, 66 y.o.   MRN: [id number] [facility] Progress Note   Assessment/ Plan:   1. 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 Transiently on CRRT for ultrafiltration after he developed acute dyspnea from pulmonary edema/worsening renal function and A. flutter with RVR.  Creatinine looks to be plateauing, hopeful for recovery.  Has a left radiocephalic fistula (mature) suitable for use.   RIJ to be removed. 2. Right lower lobe pneumonia/acute cholecystitis: On broad-spectrum antibiotic therapy and status post percutaneous cholecystostomy drain.  3. Hyponatremia/volume overload: Hyponatremia corrected-volume status improved with ultrafiltration, and compounded by hyperglycemia-continue close monitoring and reiterated limiting fluid intake to 1.5 L/day. 4. Hypertension: Improved with ultrafiltration/oral and hypertensive agents-we'll continue to monitor with ongoing diuretic therapy. 5. Hyperphosphatemia: Corrected with CRRT- continue to monitor off binders. 6. Atrial flutter with rapid ventricular response: Rate controlled with diltiazem-now back in sinus rhythm, remains on heparin drip Subjective:   Feeling well this AM urine output robust.     Objective:   BP (!) 162/62 (BP Location: Right Leg)   Pulse 65   Temp 97.6 F (36.4 C) (Oral)   Resp 16   Ht '6\' 1"'$  (1.854 m)   Wt 110.4 kg (243 lb 6.2 oz)   SpO2 94%   BMI 32.11 kg/m   Intake/Output Summary (Last 24 hours) at 09/16/16 1110 Last data filed at 09/16/16 0958  Gross per 24 hour  Intake          1241.96 ml  Output             2075 ml  Net          -833.04 ml   Weight change: 1.31 kg (2 lb 14.2 oz)  Physical Exam: Gen: Comfortably resting in bed-awake and interactive CVS: Pulse Regular rhythm, normal rate  Resp: Clear to auscultation, no rales, right IJ TDC Abd: Soft, obese, nontender, RUQ drain in  situ Ext: Trace lower extremity edema, left radiocephalic fistula with good thrill and bruit  Imaging: No results found.  Labs: BMET  Recent Labs Lab 09/11/16 1700 09/12/16 0228 09/12/16 1600 09/13/16 0400 09/14/16 0647 09/14/16 1102 09/15/16 1322 09/16/16 0458  NA 140 139  139 136 138 133* 133* 130* 132*  K 4.4 4.0  4.0 4.1 4.0 4.9 4.7 4.4 4.2  CL 106 105  105 102 102 100* 99* 95* 95*  CO2 18* '22  22 23 25 23 23 '$ 21* 19*  GLUCOSE 134* 197*  198* 227* 109* 271* 342* 492* 375*  BUN 108* 77*  77* 54* 40* 76* 81* 103* 111*  CREATININE 8.03* 5.37*  5.35* 3.71* 2.94* 4.64* 4.92* 5.43* 5.79*  CALCIUM 7.8* 7.9*  7.8* 7.8* 8.0* 7.7* 7.8* 7.8* 7.9*  PHOS 6.4* 5.0*  4.9* 3.7 3.5 6.7*  --  8.3* 9.0*   CBC  Recent Labs Lab 09/13/16 0400 09/14/16 0655 09/15/16 1323 09/16/16 0458  WBC 21.2* 16.7* 15.7* 15.4*  HGB 11.9* 10.6* 10.6* 10.2*  HCT 35.3* 31.7* 32.2* 30.9*  MCV 78.6 79.6 80.3 79.8  PLT 245 237 273 263    Medications:    . aspirin EC  81 mg Oral Daily  . cholecalciferol  1,000 Units Oral Daily  . cloNIDine  0.3 mg Oral TID  . diltiazem  240 mg Oral Daily  . hydrocortisone sodium succinate  50 mg Intravenous Q12H  . insulin aspart  0-9 Units Subcutaneous TID AC &  HS  . insulin aspart  5 Units Subcutaneous TID WC  . insulin glargine  50 Units Subcutaneous QHS  . pantoprazole  40 mg Oral Daily  . saccharomyces boulardii  250 mg Oral BID  . senna-docusate  1 tablet Oral QHS  . sodium bicarbonate  1,300 mg Oral TID   Madelon Lips MD 09/16/2016, 11:10 AM

## 2016-09-16 NOTE — Progress Notes (Signed)
ANTICOAGULATION CONSULT NOTE - Follow Up Consult  Pharmacy Consult for heparin Indication: atrial fibrillation  Labs:  Recent Labs  09/14/16 0655 09/14/16 1102 09/15/16 1319 09/15/16 1322 09/15/16 1323 09/15/16 2205 09/16/16 0458  HGB 10.6*  --   --   --  10.6*  --  10.2*  HCT 31.7*  --   --   --  32.2*  --  30.9*  PLT 237  --   --   --  273  --  263  HEPARINUNFRC  --   --  0.82*  --   --  0.93* 0.72*  CREATININE  --  4.92*  --  5.43*  --   --  5.79*    Assessment: 65 yo male remains slightly above goal on heparin despite rate decrease; RN reports that labs are being drawn from HD cath but heparin is running peripherally.  Goal of Therapy:  Heparin level 0.3-0.7 units/ml   Plan:  Will decrease heparin to 1150 units/hr.  Check level in [place], Pharm.D., BCPS Clinical Pharmacist Pager [phone] 09/16/2016 6:31 AM

## 2016-09-16 NOTE — Discharge Instructions (Signed)

## 2016-09-17 LAB — RENAL FUNCTION PANEL
Albumin: 1.8 g/dL — ABNORMAL LOW (ref 3.5–5.0)
Anion gap: 13 (ref 5–15)
BUN: 109 mg/dL — ABNORMAL HIGH (ref 6–20)
CHLORIDE: 99 mmol/L — AB (ref 101–111)
CO2: 22 mmol/L (ref 22–32)
CREATININE: 5.72 mg/dL — AB (ref 0.61–1.24)
Calcium: 7.8 mg/dL — ABNORMAL LOW (ref 8.9–10.3)
GFR calc non Af Amer: 9 mL/min — ABNORMAL LOW (ref >60)
GFR, EST AFRICAN AMERICAN: 11 mL/min — AB (ref >60)
Glucose, Bld: 191 mg/dL — ABNORMAL HIGH (ref 65–99)
POTASSIUM: 4 mmol/L (ref 3.5–5.1)
Phosphorus: 7.8 mg/dL — ABNORMAL HIGH (ref 2.5–4.6)
Sodium: 134 mmol/L — ABNORMAL LOW (ref 135–145)

## 2016-09-17 LAB — MAGNESIUM: Magnesium: 2.5 mg/dL — ABNORMAL HIGH (ref 1.7–2.4)

## 2016-09-17 LAB — HEPARIN LEVEL (UNFRACTIONATED)
HEPARIN UNFRACTIONATED: 0.19 [IU]/mL — AB (ref 0.30–0.70)
Heparin Unfractionated: 0.17 IU/mL — ABNORMAL LOW (ref 0.30–0.70)

## 2016-09-17 LAB — GLUCOSE, CAPILLARY
GLUCOSE-CAPILLARY: 145 mg/dL — AB (ref 65–99)
GLUCOSE-CAPILLARY: 170 mg/dL — AB (ref 65–99)
Glucose-Capillary: 100 mg/dL — ABNORMAL HIGH (ref 65–99)
Glucose-Capillary: 110 mg/dL — ABNORMAL HIGH (ref 65–99)

## 2016-09-17 LAB — CBC
HEMATOCRIT: 33.8 % — AB (ref 39.0–52.0)
HEMOGLOBIN: 11.2 g/dL — AB (ref 13.0–17.0)
MCH: 26.4 pg (ref 26.0–34.0)
MCHC: 33.1 g/dL (ref 30.0–36.0)
MCV: 79.5 fL (ref 78.0–100.0)
Platelets: 264 10*3/uL (ref 150–400)
RBC: 4.25 MIL/uL (ref 4.22–5.81)
RDW: 15.5 % (ref 11.5–15.5)
WBC: 20.2 10*3/uL — AB (ref 4.0–10.5)

## 2016-09-17 LAB — HEMOGLOBIN A1C
Hgb A1c MFr Bld: 9.4 % — ABNORMAL HIGH (ref 4.8–5.6)
Mean Plasma Glucose: 223 mg/dL

## 2016-09-17 LAB — PROTIME-INR
INR: 1.16
Prothrombin Time: 14.8 seconds (ref 11.4–15.2)

## 2016-09-17 MED ORDER — WARFARIN SODIUM 7.5 MG PO TABS
7.5000 mg | ORAL_TABLET | Freq: Once | ORAL | Status: AC
  Administered 2016-09-17: 7.5 mg via ORAL
  Filled 2016-09-17: qty 1

## 2016-09-17 NOTE — Care Management Note (Addendum)
Case Management Note  Patient Details  Name: ESSA MALACHI MRN: [id number] Date of Birth: 1950-12-31  Subjective/Objective:     CM following for progression and d/c planning.                Action/Plan: 09/17/2016 Met with pt re HH needs, this CM was given permission by the pt to contact his wife re [facility] services. This CM called Ms Salvino at home , who states that she has used [facility] in the past and would like to use them again. She also states that she has a rolling walker and 3:1 at home that her husband will be able to use. [place] notified of need for [facility] services , this CM also asked [facility] to determine if the pt would have a copay as his insurance is [employer]. Per Ramond Marrow of [facility] this pt will have a $25 copay of [place] visits, she will contact this pt wife and explain this copay.  3pm Per [facility] they have spoke with pt wife and Ms Fuchs states that they will pay copays for HHPT and [place].   Expected Discharge Date:    09/18/2016              Expected Discharge Plan:  [place]  In-House Referral:  NA  Discharge planning Services  CM Consult  Post Acute Care Choice:  Home Health Choice offered to:  Spouse  DME Arranged:    DME Agency:     HH Arranged:  PT, RN [place] Agency:  [place]  Status of Service:  In process, will continue to follow  If discussed at Long Length of Stay Meetings, dates discussed:    Additional Comments:  Adron Bene, RN 09/17/2016, 2:51 PM

## 2016-09-17 NOTE — Progress Notes (Signed)
PROGRESS NOTE  Gary Lang ION:[id number] DOB: 08-05-1951 DOA: 09/05/2016 PCP: [name], MD  Brief Summary:  Brief:   66 y.o. 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  He was admitted 09/05/16 with  E.Coli bacteremia due to acute cholecystitis. He was seen by [facility] and had percutaneous drain placed 01/09 as [facility] felt he was not stable enough for full surgery. Since admission, he has had worsening in renal function and also developed new onset paroxysmal atrial fib. On 1/10 Due to worsening renal function started on CVVHD (prefered over HD due to HR).   SIGNIFICANT EVENTS: 1/04 > admit sepsis > e.coli bacteremia secondary to acute cholecystitis 1/07> Percutaneous drain placed by IR 1/10 > cvvhd, fib rvr  CULTURES: Blood 01/04 > E.Coli. Gallbladder fluid 01/07 > E.Coli, K.Pneumoniae.  ANTIBIOTICS: Azithro 01/04 > 01/10 Ceftriaxone 01/04 > 01/05, 1/11 >  Zosyn 01/04 >1/11  LINES/TUBES: RIJ Vascath 1/11 > both a temporary right IJ dialysis catheter and a left radiocephalic fistula (mature) suitable for use.  Patient was admitted to [facility], transferred to ICU to have CRRT Back to [facility] service on 1/14   HPI/Recap of past 24 hours:  Report feeling tired today, denies pain, no sob,   Assessment/Plan: Active Problems:   Community acquired pneumonia of right lower lobe of lung ([place])   Acute respiratory failure with hypoxia ([place])   Acute cholecystitis due to biliary calculus   AKI (acute kidney injury) ([place])   Abdominal distention, non-gaseous   Acute abdominal pain   Fever   PAF (paroxysmal atrial fibrillation) ([place])  #Sepsis due to E Coli bacteremia and possibly commonly acquired pneumonia/acute cholecystitis: Chest x-ray showed right lower lobe pneumonia. Patient with tachycardia, leukocytosis, fever and abnormal x-ray finding on admission -2 out of 2 blood cultures + Escherichia coli. Drain culture with  ESCHERICHIA COLI  and KLEBSIELLA PNEUMONIAE --S/p perc cholecystostomy 09/08/16.per IR needsOP follow-up in IR clinic in 5-6 weeks -general surgery oked to start coumadin, patient is to follow up with general surgery in a few weeks for  elective laparoscopic cholecystectomy   -sepsis resolving, taper off steroids, finished 10days of iv abx, d/c abx on 1/14, repeat blood culture no growth     #Acute hypoxic respiratory failure likely in the setting of pneumonia and sepsis:  Finished abx treatment on 1/14, Improved, on room air. Encourage incentive spirometry   #Acute on chronic kidney disease, history of FSGN , has AVF in place,  temporary right IJ dialysis catheter placed on admission and removed on 1/16 -Patient required hemodialysis in the past and he has left upper extremity AV fistula. His underlying chronic kidney disease is from FSGS/diabetes and hypertension. Acute renal insufficiency appears to be associated with sepsis/ATN.  He he required CRRT due to afib/pulmonary edema.  -good urine output, awaiting renal recovery, trend cr, temporary right IJ to be removed per nephrology,  will follow nephrology recommendation.  #Elevated troponin likely in the setting of sepsis/aki: Trend cardiac enzymes in the setting of new onset atrial fibrillation -Echocardiogram showed vigorous left ventricular systolic function with EF of 65-30%. There were no regional wall motion abnormalities. Also with  grade 1 diastolic dysfunction. Converted to sinus rhythm, currently on ccb Both general surgery and cardiology oked with transition from heparin drip to  Coumadin on 1/15 Cardiology signed off on 1/15, outpatient follow up recommended he is new on coumadin, coumadin teaching, patient nee to have outpatient coumadin level checked.  Uncontrolled diabetes, insulin dependent Required insulin drip initially Now on Lantus, sliding scale insulin,  Blood sugar elevated, increase insulin, taper steroids a1c 9.4  HTN; on  ccb. clonidine  Physical deconditioning Prior to hospitalization, he is independent with ADLs. PT eval, will need home health, drain care/PT if patient agrees to.  DVT prophylaxis: Heparin drip, transition to coumadin on 1/15 Code Status: Full code Family Communication: patient,  Disposition Plan: pending, need nephrology clearance, home health drain care/pt    Consultants:   Nephrology  Cardiology  critical care  General surgery  IR     Objective: BP (!) 134/56 (BP Location: Right Leg)   Pulse 91   Temp 98 F (36.7 C) (Oral)   Resp 14   Ht '6\' 1"'$  (1.854 m)   Wt 110.4 kg (243 lb 6.2 oz)   SpO2 96%   BMI 32.11 kg/m   Intake/Output Summary (Last 24 hours) at 09/17/16 1311 Last data filed at 09/17/16 1040  Gross per 24 hour  Intake              840 ml  Output             1050 ml  Net             -210 ml   Filed Weights   09/15/16 0233 09/15/16 2107 09/16/16 0159  Weight: 109.1 kg (240 lb 8.4 oz) 110.4 kg (243 lb 6.2 oz) 110.4 kg (243 lb 6.2 oz)    Exam:   General:  NAD  Cardiovascular: RRR  Respiratory: CTABL  Abdomen: Soft/ND/NT, positive BS,  perc cholecystostomy in place  Musculoskeletal: No Edema  Neuro: aaox3  Data Reviewed: Basic Metabolic Panel:  Recent Labs Lab 09/13/16 0400 09/14/16 0647 09/14/16 1102 09/15/16 1322 09/15/16 1323 09/16/16 0458 09/17/16 0430  NA 138 133* 133* 130*  --  132* 134*  K 4.0 4.9 4.7 4.4  --  4.2 4.0  CL 102 100* 99* 95*  --  95* 99*  CO2 '25 23 23 '$ 21*  --  19* 22  GLUCOSE 109* 271* 342* 492*  --  375* 191*  BUN 40* 76* 81* 103*  --  111* 109*  CREATININE 2.94* 4.64* 4.92* 5.43*  --  5.79* 5.72*  CALCIUM 8.0* 7.7* 7.8* 7.8*  --  7.9* 7.8*  MG 2.5* 2.7*  --   --  2.5* 2.7* 2.5*  PHOS 3.5 6.7*  --  8.3*  --  9.0* 7.8*   Liver Function Tests:  Recent Labs Lab 09/12/16 0228  09/13/16 0400 09/14/16 0647 09/15/16 1322 09/16/16 0458 09/17/16 0430  AST 21  --   --   --   --   --   --   ALT  30  --   --   --   --   --   --   ALKPHOS 95  --   --   --   --   --   --   BILITOT 1.6*  --   --   --   --   --   --   PROT 6.4*  --   --   --   --   --   --   ALBUMIN 1.8*  1.7*  < > 1.8* 1.7* 1.6* 1.7* 1.8*  < > = values in this interval not displayed. No results for input(s): LIPASE, AMYLASE in the last 168 hours. No results for input(s): AMMONIA in the last 168 hours. CBC:  Recent Labs Lab  09/13/16 0400 09/14/16 0655 09/15/16 1323 09/16/16 0458 09/17/16 0430  WBC 21.2* 16.7* 15.7* 15.4* 20.2*  HGB 11.9* 10.6* 10.6* 10.2* 11.2*  HCT 35.3* 31.7* 32.2* 30.9* 33.8*  MCV 78.6 79.6 80.3 79.8 79.5  PLT 245 237 273 263 264   Cardiac Enzymes:   No results for input(s): CKTOTAL, CKMB, CKMBINDEX, TROPONINI in the last 168 hours. BNP (last 3 results)  Recent Labs  09/07/16 0719  BNP 101.0*    ProBNP (last 3 results) No results for input(s): PROBNP in the last 8760 hours.  CBG:  Recent Labs Lab 09/16/16 1141 09/16/16 1735 09/16/16 2136 09/17/16 0819 09/17/16 1141  GLUCAP 279* 297* 258* 145* 170*    Recent Results (from the past 240 hour(s))  Aerobic/Anaerobic Culture (surgical/deep wound)     Status: None   Collection Time: 09/08/16 12:11 PM  Result Value Ref Range Status   Specimen Description ABSCESS GALL BLADDER  Final   Special Requests Normal  Final   Gram Stain   Final    RARE WBC PRESENT,BOTH PMN AND MONONUCLEAR NO ORGANISMS SEEN    Culture   Final    MODERATE ESCHERICHIA COLI MODERATE KLEBSIELLA PNEUMONIAE NO ANAEROBES ISOLATED    Report Status 09/13/2016 FINAL  Final   Organism ID, Bacteria ESCHERICHIA COLI  Final   Organism ID, Bacteria KLEBSIELLA PNEUMONIAE  Final      Susceptibility   Escherichia coli - MIC*    AMPICILLIN >=32 RESISTANT Resistant     CEFAZOLIN <=4 SENSITIVE Sensitive     CEFEPIME <=1 SENSITIVE Sensitive     CEFTAZIDIME <=1 SENSITIVE Sensitive     CEFTRIAXONE <=1 SENSITIVE Sensitive     CIPROFLOXACIN <=0.25 SENSITIVE  Sensitive     GENTAMICIN <=1 SENSITIVE Sensitive     IMIPENEM <=0.25 SENSITIVE Sensitive     TRIMETH/SULFA <=20 SENSITIVE Sensitive     AMPICILLIN/SULBACTAM 16 INTERMEDIATE Intermediate     PIP/TAZO <=4 SENSITIVE Sensitive     Extended ESBL NEGATIVE Sensitive     * MODERATE ESCHERICHIA COLI   Klebsiella pneumoniae - MIC*    AMPICILLIN 16 RESISTANT Resistant     CEFAZOLIN <=4 SENSITIVE Sensitive     CEFEPIME <=1 SENSITIVE Sensitive     CEFTAZIDIME <=1 SENSITIVE Sensitive     CEFTRIAXONE <=1 SENSITIVE Sensitive     CIPROFLOXACIN <=0.25 SENSITIVE Sensitive     GENTAMICIN <=1 SENSITIVE Sensitive     IMIPENEM <=0.25 SENSITIVE Sensitive     TRIMETH/SULFA <=20 SENSITIVE Sensitive     AMPICILLIN/SULBACTAM <=2 SENSITIVE Sensitive     PIP/TAZO <=4 SENSITIVE Sensitive     Extended ESBL NEGATIVE Sensitive     * MODERATE KLEBSIELLA PNEUMONIAE  MRSA PCR Screening     Status: None   Collection Time: 09/09/16  4:49 PM  Result Value Ref Range Status   MRSA by PCR NEGATIVE NEGATIVE Final    Comment:        The GeneXpert MRSA Assay (FDA approved for NASAL specimens only), is one component of a comprehensive MRSA colonization surveillance program. It is not intended to diagnose MRSA infection nor to guide or monitor treatment for MRSA infections.      Studies: No results found.  Scheduled Meds: . aspirin EC  81 mg Oral Daily  . cholecalciferol  1,000 Units Oral Daily  . cloNIDine  0.3 mg Oral TID  . diltiazem  240 mg Oral Daily  . insulin aspart  0-9 Units Subcutaneous TID AC & HS  . insulin aspart  5 Units  Subcutaneous TID WC  . insulin glargine  50 Units Subcutaneous QHS  . pantoprazole  40 mg Oral Daily  . saccharomyces boulardii  250 mg Oral BID  . senna-docusate  1 tablet Oral QHS  . sodium bicarbonate  1,300 mg Oral TID  . warfarin  7.5 mg Oral ONCE-1800  . Warfarin - Pharmacist Dosing Inpatient   Does not apply q1800    Continuous Infusions: . heparin 1,150 Units/hr  (09/16/16 1001)     Time spent: 25 mins  Brynleigh Sequeira MD, PhD  [facility] Pager [phone]. If 7PM-7AM, please contact night-coverage at [email or web address], password [facility] 09/17/2016, 1:11 PM  LOS: 12 days

## 2016-09-17 NOTE — Progress Notes (Signed)
ANTICOAGULATION CONSULT NOTE - Follow Up Consult  Pharmacy Consult for Heparin Indication: atrial fibrillation  Patient Measurements: Height: '6\' 1"'$  (185.4 cm) Weight: 243 lb 6.2 oz (110.4 kg) IBW/kg (Calculated) : 79.9 Heparin Dosing Weight: 103 kg  Labs:  Recent Labs  09/15/16 1322  09/15/16 1323  09/16/16 0458 09/16/16 1820 09/17/16 0430 09/17/16 1553  HGB  --   < > 10.6*  --  10.2*  --  11.2*  --   HCT  --   --  32.2*  --  30.9*  --  33.8*  --   PLT  --   --  273  --  263  --  264  --   LABPROT  --   --   --   --   --   --  14.8  --   INR  --   --   --   --   --   --  1.16  --   HEPARINUNFRC  --   --   --   < > 0.72* 0.32 0.17* 0.19*  CREATININE 5.43*  --   --   --  5.79*  --  5.72*  --   < > = values in this interval not displayed.  Estimated Creatinine Clearance: 16.5 mL/min (by C-G formula based on SCr of 5.72 mg/dL (H)).   Medications:  Heparin @ 1250 units/hr  Assessment: 65 YOM with new onset Afib with RVR.  The patient was started on Heparin on 1/10. Oral anticoagulation was held pending possible surgical plans for cholecystitis. There is no immediate surgery planned so pharmacy was consulted to add warfarin starting 1/15.   Heparin level this evening is subtherapeutic at 0.19 (goal 0.3-0.7).    Goal of Therapy:  INR 2-3 Heparin level 0.3-0.7 units/ml Monitor platelets by anticoagulation protocol: Yes   Plan:  1. Increase heparin infusion to 1400 units/hr 2. Heparin level in am  3. Daily heparin level and CBC   Thank you for allowing pharmacy to be a part of this patient's care.  Vincenza Hews, PharmD, BCPS 09/17/2016, 5:57 PM Pager: [phone]

## 2016-09-17 NOTE — Progress Notes (Signed)
Patient ID: Gary Lang, male   DOB: 1951-01-03, 66 y.o.   MRN: [id number] [facility] Progress Note   Assessment/ Plan:   1. 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 Transiently on CRRT for ultrafiltration after he developed acute dyspnea from pulmonary edema/worsening renal function and A. flutter with RVR.  Creatinine looks to be plateauing, hopeful for recovery.  Has a left radiocephalic fistula (mature) suitable for use.   RIJ d/c. Takes Lasix 40 mg daily at home, will restart tomorrow if cr continuing to downtrend 2. Right lower lobe pneumonia/acute cholecystitis: On broad-spectrum antibiotic therapy and status post percutaneous cholecystostomy drain; WBC ct increasing today, all the better IJ is going to be removed.    3. Hyponatremia/volume overload: Hyponatremia corrected-volume status improved with ultrafiltration, and compounded by hyperglycemia-continue close monitoring and reiterated limiting fluid intake to 1.5 L/day. 4. Hypertension: Improved with ultrafiltration/oral and hypertensive agents-we'll continue to monitor with ongoing diuretic therapy. 5. Hyperphosphatemia: Corrected with CRRT- continue to monitor off binders. 6. Atrial flutter with rapid ventricular response: Rate controlled with diltiazem-now back in sinus rhythm, remains on heparin drip Subjective:   Sleeping this AM no complaints   Objective:   BP (!) 134/56 (BP Location: Right Leg)   Pulse 91   Temp 98 F (36.7 C) (Oral)   Resp 14   Ht '6\' 1"'$  (1.854 m)   Wt 110.4 kg (243 lb 6.2 oz)   SpO2 96%   BMI 32.11 kg/m   Intake/Output Summary (Last 24 hours) at 09/17/16 1229 Last data filed at 09/17/16 1040  Gross per 24 hour  Intake              840 ml  Output             1050 ml  Net             -210 ml   Weight change:   Physical Exam: Gen: Comfortably resting in bed-awake and interactive CVS: Pulse Regular rhythm, normal  rate  Resp: Clear to auscultation, no rales, right IJ TDC Abd: Soft, obese, nontender, RUQ drain in situ Ext: Trace lower extremity edema, left radiocephalic fistula with good thrill and bruit  Imaging: No results found.  Labs: BMET  Recent Labs Lab 09/12/16 0228 09/12/16 1600 09/13/16 0400 09/14/16 0647 09/14/16 1102 09/15/16 1322 09/16/16 0458 09/17/16 0430  NA 139  139 136 138 133* 133* 130* 132* 134*  K 4.0  4.0 4.1 4.0 4.9 4.7 4.4 4.2 4.0  CL 105  105 102 102 100* 99* 95* 95* 99*  CO2 '22  22 23 25 23 23 '$ 21* 19* 22  GLUCOSE 197*  198* 227* 109* 271* 342* 492* 375* 191*  BUN 77*  77* 54* 40* 76* 81* 103* 111* 109*  CREATININE 5.37*  5.35* 3.71* 2.94* 4.64* 4.92* 5.43* 5.79* 5.72*  CALCIUM 7.9*  7.8* 7.8* 8.0* 7.7* 7.8* 7.8* 7.9* 7.8*  PHOS 5.0*  4.9* 3.7 3.5 6.7*  --  8.3* 9.0* 7.8*   CBC  Recent Labs Lab 09/14/16 0655 09/15/16 1323 09/16/16 0458 09/17/16 0430  WBC 16.7* 15.7* 15.4* 20.2*  HGB 10.6* 10.6* 10.2* 11.2*  HCT 31.7* 32.2* 30.9* 33.8*  MCV 79.6 80.3 79.8 79.5  PLT 237 273 263 264    Medications:    . aspirin EC  81 mg Oral Daily  . cholecalciferol  1,000 Units Oral Daily  . cloNIDine  0.3 mg Oral TID  .  diltiazem  240 mg Oral Daily  . insulin aspart  0-9 Units Subcutaneous TID AC & HS  . insulin aspart  5 Units Subcutaneous TID WC  . insulin glargine  50 Units Subcutaneous QHS  . pantoprazole  40 mg Oral Daily  . saccharomyces boulardii  250 mg Oral BID  . senna-docusate  1 tablet Oral QHS  . sodium bicarbonate  1,300 mg Oral TID  . warfarin  7.5 mg Oral ONCE-1800  . Warfarin - Pharmacist Dosing Inpatient   Does not apply q1800   Madelon Lips MD 09/17/2016, 12:29 PM

## 2016-09-17 NOTE — Progress Notes (Signed)
ANTICOAGULATION CONSULT NOTE - Follow Up Consult  Pharmacy Consult for Heparin + Warfarin Indication: atrial fibrillation  Patient Measurements: Height: '6\' 1"'$  (185.4 cm) Weight: 243 lb 6.2 oz (110.4 kg) IBW/kg (Calculated) : 79.9 Heparin Dosing Weight: 103 kg  Labs:  Recent Labs  09/15/16 1322  09/15/16 1323  09/16/16 0458 09/16/16 1820 09/17/16 0430  HGB  --   < > 10.6*  --  10.2*  --  11.2*  HCT  --   --  32.2*  --  30.9*  --  33.8*  PLT  --   --  273  --  263  --  264  LABPROT  --   --   --   --   --   --  14.8  INR  --   --   --   --   --   --  1.16  HEPARINUNFRC  --   --   --   < > 0.72* 0.32 0.17*  CREATININE 5.43*  --   --   --  5.79*  --  5.72*  < > = values in this interval not displayed.  Estimated Creatinine Clearance: 16.5 mL/min (by C-G formula based on SCr of 5.72 mg/dL (H)).   Medications:  Heparin @ 1250 units/hr  Assessment: 65 YOM with new onset Afib with RVR. The patient was started on Heparin on 1/10. Oral anticoagulation was held pending possible surgical plans for cholecystitis. There is no immediate surgery planned so pharmacy was consulted to add warfarin starting 1/15.   The patient's heparin drip was adjusted earlier this morning - per RN the rate has been adjusted however the MAR does not reflect this change rate. Still awaiting phlebotomy to get the afternoon heparin level.   INR remains SUBtherapeutic after starting warfarin yesterday evening (INR 1.16, goal of 2-3). CBC stable. No overt s/sx of bleeding noted at this time.   The patient was educated on warfarin this admission.   Goal of Therapy:  INR 2-3 Heparin level 0.3-0.7 units/ml Monitor platelets by anticoagulation protocol: Yes   Plan:  1. Continue Heparin at 1250 units/hr (12.5 ml/hr) until after heparin level has been obtained and resulted 2. Repeat Warfarin 7.5 mg x 1 dose at 1800 today 3. Daily PT/INR, CBC q72h 4. Will continue to monitor for any signs/symptoms of bleeding  and will follow up with PT/INR in the a.m.   Thank you for allowing pharmacy to be a part of this patient's care.  Alycia Rossetti, PharmD, BCPS Clinical Pharmacist Pager: [phone] Clinical phone for 09/17/2016 from 7a-3:30p: [phone] If after 3:30p, please call main pharmacy at: [phone] 09/17/2016 11:45 AM

## 2016-09-17 NOTE — Progress Notes (Signed)
Physical Therapy Treatment Patient Details Name: Gary Lang MRN: [id number] DOB: 08/05/1951 Today's Date: 09/17/2016    History of Present Illness pt presents from outside hospital with PNA, acute on chronic kidney disease, and Cholecystitis now s/p drain placement.  pt with hx of CKD, DM, HTN, and RA.      PT Comments    Pt progressing steadily.  Quicker to fatigue, but able to mobilize OOB, stand and ambulate with RW, all with min guard assist.   Follow Up Recommendations  Home health PT;Supervision/Assistance - 24 hour     Equipment Recommendations  Rolling walker with 5" wheels;3in1 (PT)    Recommendations for Other Services       Precautions / Restrictions Precautions Precautions: Fall    Mobility  Bed Mobility Overal bed mobility: Needs Assistance Bed Mobility: Supine to Sit     Supine to sit: Min guard     General bed mobility comments: HOB slightly elevated  Transfers Overall transfer level: Needs assistance Equipment used: Rolling walker (2 wheeled) Transfers: Sit to/from Stand Sit to Stand: Min guard         General transfer comment: slow and effortful, but no assist  Ambulation/Gait Ambulation/Gait assistance: Min guard Ambulation Distance (Feet): 40 Feet Assistive device: Rolling walker (2 wheeled) Gait Pattern/deviations: Trunk flexed   Gait velocity interpretation: Below normal speed for age/gender General Gait Details: slow, tentative gait.  Cues for posture, quicker to fatigue   Stairs            Wheelchair Mobility    Modified Rankin (Stroke Patients Only)       Balance     Sitting balance-Leahy Scale: Fair     Standing balance support: Bilateral upper extremity supported;Single extremity supported;During functional activity Standing balance-Leahy Scale: Poor (though improving) Standing balance comment: reliant on RW                    Cognition Arousal/Alertness: Awake/alert Behavior During Therapy: WFL  for tasks assessed/performed Overall Cognitive Status: Within Functional Limits for tasks assessed                      Exercises General Exercises - Lower Extremity Heel Slides: AROM;Strengthening;Both;10 reps (graded, but heavy resistance)    General Comments        Pertinent Vitals/Pain Pain Assessment: No/denies pain    Home Living                      Prior Function            PT Goals (current goals can now be found in the care plan section) Acute Rehab PT Goals Patient Stated Goal: Pt wants to go home.  PT Goal Formulation: With patient Time For Goal Achievement: 09/23/16 Potential to Achieve Goals: Good Progress towards PT goals: Progressing toward goals    Frequency    Min 3X/week      PT Plan Current plan remains appropriate    Co-evaluation             End of Session   Activity Tolerance: Patient limited by fatigue Patient left: in chair;with call bell/phone within reach     Time: 1246-1314 PT Time Calculation (min) (ACUTE ONLY): 28 min  Charges:  $Gait Training: 8-22 mins $Therapeutic Activity: 8-22 mins                    G Codes:      Tessie Fass  Cleon Thoma 09/17/2016, 1:54 PM

## 2016-09-17 NOTE — Progress Notes (Signed)
ANTICOAGULATION CONSULT NOTE - Follow Up Consult  Pharmacy Consult for Heparin Indication: atrial fibrillation  Patient Measurements: Height: '6\' 1"'$  (185.4 cm) Weight: 243 lb 6.2 oz (110.4 kg) IBW/kg (Calculated) : 79.9 Heparin Dosing Weight: 103 kg  Labs:  Recent Labs  09/14/16 1102  09/15/16 1322 09/15/16 1323  09/16/16 0458 09/16/16 1820 09/17/16 0430  HGB  --   --   --  10.6*  --  10.2*  --  11.2*  HCT  --   --   --  32.2*  --  30.9*  --  33.8*  PLT  --   --   --  273  --  263  --  264  LABPROT  --   --   --   --   --   --   --  14.8  INR  --   --   --   --   --   --   --  1.16  HEPARINUNFRC  --   < >  --   --   < > 0.72* 0.32 0.17*  CREATININE 4.92*  --  5.43*  --   --  5.79*  --   --   < > = values in this interval not displayed.  Estimated Creatinine Clearance: 16.3 mL/min (by C-G formula based on SCr of 5.79 mg/dL (H)).   Medications:  Heparin @ 1150 units/hr  Assessment: 66 yo M with new onset Afib with RVR. The patient was started on Heparin on 1/10. Oral anticoagulation was held pending possible surgical plans for cholecystitis- oral AC has now been resumed.  Heparin level subtherapeutic on Heparin at 1150 units/hr.  No IV issues per RN.  Goal of Therapy:  INR 2-3 Heparin level 0.3-0.7 units/ml Monitor platelets by anticoagulation protocol: Yes   Plan:  - Increase heparin to 1250 units/hr  - Heparin level at 1400  Thank you for allowing pharmacy to be a part of this patient's care.  [employer], Pharm.D., BCPS Clinical Pharmacist Pager [phone] 09/17/2016 5:50 AM

## 2016-09-18 LAB — CBC
HCT: 31.7 % — ABNORMAL LOW (ref 39.0–52.0)
Hemoglobin: 10.3 g/dL — ABNORMAL LOW (ref 13.0–17.0)
MCH: 26.3 pg (ref 26.0–34.0)
MCHC: 32.5 g/dL (ref 30.0–36.0)
MCV: 80.9 fL (ref 78.0–100.0)
PLATELETS: 236 10*3/uL (ref 150–400)
RBC: 3.92 MIL/uL — ABNORMAL LOW (ref 4.22–5.81)
RDW: 16 % — AB (ref 11.5–15.5)
WBC: 19 10*3/uL — ABNORMAL HIGH (ref 4.0–10.5)

## 2016-09-18 LAB — GLUCOSE, CAPILLARY
Glucose-Capillary: 91 mg/dL (ref 65–99)
Glucose-Capillary: 90 mg/dL (ref 65–99)
Glucose-Capillary: 183 mg/dL — ABNORMAL HIGH (ref 65–99)
Glucose-Capillary: 143 mg/dL — ABNORMAL HIGH (ref 65–99)
Glucose-Capillary: 123 mg/dL — ABNORMAL HIGH (ref 65–99)

## 2016-09-18 LAB — RENAL FUNCTION PANEL
ANION GAP: 13 (ref 5–15)
Albumin: 1.7 g/dL — ABNORMAL LOW (ref 3.5–5.0)
BUN: 108 mg/dL — ABNORMAL HIGH (ref 6–20)
CALCIUM: 8.1 mg/dL — AB (ref 8.9–10.3)
CO2: 23 mmol/L (ref 22–32)
Chloride: 99 mmol/L — ABNORMAL LOW (ref 101–111)
Creatinine, Ser: 6.1 mg/dL — ABNORMAL HIGH (ref 0.61–1.24)
GFR calc non Af Amer: 9 mL/min — ABNORMAL LOW (ref >60)
GFR, EST AFRICAN AMERICAN: 10 mL/min — AB (ref >60)
GLUCOSE: 92 mg/dL (ref 65–99)
POTASSIUM: 4.1 mmol/L (ref 3.5–5.1)
Phosphorus: 8.2 mg/dL — ABNORMAL HIGH (ref 2.5–4.6)
SODIUM: 135 mmol/L (ref 135–145)

## 2016-09-18 LAB — PROTIME-INR
INR: 1.36
Prothrombin Time: 16.8 seconds — ABNORMAL HIGH (ref 11.4–15.2)

## 2016-09-18 LAB — HEPARIN LEVEL (UNFRACTIONATED)
HEPARIN UNFRACTIONATED: 0.18 [IU]/mL — AB (ref 0.30–0.70)
Heparin Unfractionated: 0.18 IU/mL — ABNORMAL LOW (ref 0.30–0.70)

## 2016-09-18 LAB — MAGNESIUM: MAGNESIUM: 2.6 mg/dL — AB (ref 1.7–2.4)

## 2016-09-18 MED ORDER — WARFARIN SODIUM 7.5 MG PO TABS
7.5000 mg | ORAL_TABLET | Freq: Once | ORAL | Status: AC
  Administered 2016-09-18: 7.5 mg via ORAL
  Filled 2016-09-18: qty 1

## 2016-09-18 NOTE — Progress Notes (Signed)
Minimal bleeding noted to Rt side of neck from prevoius Cath access. Dressing reinforced. No further bleeding noted. Foley cath emptied noted to have a rosy color with minimal blood clots noted .

## 2016-09-18 NOTE — Progress Notes (Signed)
ANTICOAGULATION CONSULT NOTE - Follow Up Consult  Pharmacy Consult for Heparin Indication: atrial fibrillation  Patient Measurements: Height: '6\' 1"'$  (185.4 cm) Weight: 243 lb 6.2 oz (110.4 kg) IBW/kg (Calculated) : 79.9 Heparin Dosing Weight: 103 kg  Labs:  Recent Labs  09/16/16 0458  09/17/16 0430 09/17/16 1553 09/18/16 0651 09/18/16 1846  HGB 10.2*  --  11.2*  --  10.3*  --   HCT 30.9*  --  33.8*  --  31.7*  --   PLT 263  --  264  --  236  --   LABPROT  --   --  14.8  --  16.8*  --   INR  --   --  1.16  --  1.36  --   HEPARINUNFRC 0.72*  < > 0.17* 0.19* 0.18* 0.18*  CREATININE 5.79*  --  5.72*  --  6.10*  --   < > = values in this interval not displayed.  Estimated Creatinine Clearance: 15.5 mL/min (by C-G formula based on SCr of 6.1 mg/dL (H)).   Medications:  Heparin @ 1600 units/hr  Assessment: 65 YOM with new onset Afib with RVR.  The patient was started on Heparin on 1/10. Oral anticoagulation was held pending possible surgical plans for cholecystitis. There is no immediate surgery planned so pharmacy was consulted to add warfarin starting 1/15.   Heparin level this evening remains SUBtherapeutic despite a rate increase earlier today.  Goal of Therapy:  INR 2-3 Heparin level 0.3-0.7 units/ml Monitor platelets by anticoagulation protocol: Yes   Plan:  1. Increase Heparin to 1800 units/hr (18 ml/hr) 2. Will continue to monitor for any signs/symptoms of bleeding and will follow up with heparin level in 8 hours and PT/INR in the AM  Thank you for allowing pharmacy to be a part of this patient's care.  Vincenza Hews, PharmD, BCPS 09/18/2016, 7:38 PM

## 2016-09-18 NOTE — Progress Notes (Signed)
Assessment/ Plan:   1.Acute renal failure on chronic kidney disease stage WPV:[id number] CKD from FSGS/diabetes and hypertension. Transiently  CRRT for acute dyspnea from pulmonary edema/worsening renal function and A. flutter with RVR.  Creatinine looks to be plateauing, hopeful for recovery.  Has a left radiocephalic fistula (mature) suitable for use.   RIJ d/c. Takes Lasix 40 mg daily at home,  cr continuing to downtrend. IJ remains. No dialysis today. 2. Right lower lobe pneumonia/acute cholecystitis:  broad-spectrum antibiotics and s/p percutaneous cholecystostomy     3. Hyponatremia/volume overload: Hyponatremia corrected-, continue close monitoring and  limiting fluid intake to 1.5 L/day. 4. Hypertension: hypertensive agents-continue to monitor. 5. Hyperphosphatemia: continue to monitor off binders pending recovery of renal fct. 6. Atrial flutter with rapid ventricular response:now sinus rhythm,  heparin drip & warfarin  Subjective: Interval History: Feels ok  Objective: Vital signs in last 24 hours: Temp:  [98 F (36.7 C)-99 F (37.2 C)] 99 F (37.2 C) (01/17 0758) Pulse Rate:  [77-91] 77 (01/17 0758) Resp:  [14-18] 17 (01/17 0758) BP: (134-170)/(56-71) 150/57 (01/17 0758) SpO2:  [93 %-97 %] 93 % (01/17 0758) Weight change:   Intake/Output from previous day: 01/16 0701 - 01/17 0700 In: 1376 [P.O.:780; I.V.:596] Out: 2300 [Urine:2100; Drains:200] Intake/Output this shift: No intake/output data recorded.  General appearance: alert and cooperative Head: Normocephalic, without obvious abnormality, atraumatic Cardio: regular rate and rhythm, S1, S2 normal, no murmur, click, rub or gallop GI: soft, non-tender; bowel sounds normal; no masses,  no organomegaly and right cholecystotomy tube Extremitietredema tr   LUE AVF ok  Lab Results:  Recent Labs  09/17/16 0430 09/18/16 0651  WBC 20.2* 19.0*  HGB 11.2* 10.3*  HCT 33.8* 31.7*  PLT 264 236   BMET:  Recent Labs  09/17/16 0430 09/18/16 0651  NA 134* 135  K 4.0 4.1  CL 99* 99*  CO2 22 23  GLUCOSE 191* 92  BUN 109* 108*  CREATININE 5.72* 6.10*  CALCIUM 7.8* 8.1*   No results for input(s): PTH in the last 72 hours. Iron Studies: No results for input(s): IRON, TIBC, TRANSFERRIN, FERRITIN in the last 72 hours. Studies/Results: No results found.  Scheduled: . aspirin EC  81 mg Oral Daily  . cholecalciferol  1,000 Units Oral Daily  . cloNIDine  0.3 mg Oral TID  . diltiazem  240 mg Oral Daily  . insulin aspart  0-9 Units Subcutaneous TID AC & HS  . insulin aspart  5 Units Subcutaneous TID WC  . insulin glargine  50 Units Subcutaneous QHS  . pantoprazole  40 mg Oral Daily  . saccharomyces boulardii  250 mg Oral BID  . senna-docusate  1 tablet Oral QHS  . sodium bicarbonate  1,300 mg Oral TID  . warfarin  7.5 mg Oral ONCE-1800  . Warfarin - Pharmacist Dosing Inpatient   Does not apply q1800   Continuous: . heparin 1,400 Units/hr (09/18/16 0142)     LOS: 13 days   Gardenia Witter C 09/18/2016,10:23 AM

## 2016-09-18 NOTE — Progress Notes (Signed)
ANTICOAGULATION CONSULT NOTE - Follow Up Consult  Pharmacy Consult for Heparin Indication: atrial fibrillation  Patient Measurements: Height: '6\' 1"'$  (185.4 cm) Weight: 243 lb 6.2 oz (110.4 kg) IBW/kg (Calculated) : 79.9 Heparin Dosing Weight: 103 kg  Labs:  Recent Labs  09/16/16 0458  09/17/16 0430 09/17/16 1553 09/18/16 0651  HGB 10.2*  --  11.2*  --  10.3*  HCT 30.9*  --  33.8*  --  31.7*  PLT 263  --  264  --  236  LABPROT  --   --  14.8  --  16.8*  INR  --   --  1.16  --  1.36  HEPARINUNFRC 0.72*  < > 0.17* 0.19* 0.18*  CREATININE 5.79*  --  5.72*  --  6.10*  < > = values in this interval not displayed.  Estimated Creatinine Clearance: 15.5 mL/min (by C-G formula based on SCr of 6.1 mg/dL (H)).   Medications:  Heparin @ 1400 units/hr  Assessment: 65 YOM with new onset Afib with RVR.  The patient was started on Heparin on 1/10. Oral anticoagulation was held pending possible surgical plans for cholecystitis. There is no immediate surgery planned so pharmacy was consulted to add warfarin starting 1/15.   Heparin level this morning remains SUBtherapeutic despite a rate change yesterday evening (HL 0.18 << 0.19, goal of 0.3-0.7). INR is SUBtherapeutic but trending up (INR 1.36 << 1.16, goal of 2-3). Will increase Heparin but continue with the current warfarin dose due to low albumin.  Goal of Therapy:  INR 2-3 Heparin level 0.3-0.7 units/ml Monitor platelets by anticoagulation protocol: Yes   Plan:  1. Increase Heparin to 1600 units/hr (16 ml/hr) 2. Warfarin 7.5 mg x 1 dose at 1800 today 3. Will continue to monitor for any signs/symptoms of bleeding and will follow up with heparin level in 8 hours and PT/INR in the AM  Thank you for allowing pharmacy to be a part of this patient's care.  Alycia Rossetti, PharmD, BCPS Clinical Pharmacist Pager: [phone] Clinical phone for 09/18/2016 from 7a-3:30p: [phone] If after 3:30p, please call main pharmacy at:  [phone] 09/18/2016 9:29 AM

## 2016-09-18 NOTE — Progress Notes (Signed)
PROGRESS NOTE  Gary Lang:[id number] DOB: 1951-01-20 DOA: 09/05/2016 PCP: [name], MD  Brief Summary:  Brief:   66 y.o. 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  He was admitted 09/05/16 with  E.Coli bacteremia due to acute cholecystitis. He was seen by [facility]CS and had percutaneous drain placed 01/09 as CCS felt he was not stable enough for full surgery. Since admission, he has had worsening in renal function and also developed new onset paroxysmal atrial fib. On 1/10 Due to worsening renal function started on CVVHD (prefered over HD due to HR).  SIGNIFICANT EVENTS: 1/04 > admit sepsis > e.coli bacteremia secondary to acute cholecystitis 1/07> Percutaneous drain placed by IR 1/10 > cvvhd, fib rvr  CULTURES: Blood 01/04 > E.Coli. Gallbladder fluid 01/07 > E.Coli, K.Pneumoniae.  ANTIBIOTICS: Azithro 01/04 > 01/10 Ceftriaxone 01/04 > 01/05, 1/11 >  Zosyn 01/04 >1/11  LINES/TUBES: RIJ Vascath 1/11 > both a temporary right IJ dialysis catheter and a left radiocephalic fistula (mature) suitable for use.  Patient was admitted to [facility], transferred to ICU to have CRRT Back to [facility] service on 1/14   HPI/Recap of past 24 hours:  Pt has no new complaints  Assessment/Plan: Active Problems:   Community acquired pneumonia of right lower lobe of lung ([place])   Acute respiratory failure with hypoxia ([place])   Acute cholecystitis due to biliary calculus   AKI (acute kidney injury) ([place])   Abdominal distention, non-gaseous   Acute abdominal pain   Fever   PAF (paroxysmal atrial fibrillation) ([place])  #Sepsis due to E Coli bacteremia and possibly commonly acquired pneumonia/acute cholecystitis: Chest x-ray showed right lower lobe pneumonia. Patient with tachycardia, leukocytosis, fever and abnormal x-ray finding on admission -2 out of 2 blood cultures + Escherichia coli. Drain culture with  ESCHERICHIA COLI and KLEBSIELLA  PNEUMONIAE --S/p perc cholecystostomy 09/08/16.per IR needsOP follow-up in IR clinic in 5-6 weeks -general surgery oked to start coumadin, patient is to follow up with general surgery in a few weeks for elective laparoscopic cholecystectomy   -sepsis resolving, taper off steroids, finished 10days of iv abx, d/c abx on 1/14, repeat blood culture no growth    #Acute hypoxic respiratory failure likely in the setting of pneumonia and sepsis:  Finished abx treatment on 1/14, Improved, on room air. Encourage incentive spirometry   #Acute on chronic kidney disease, history of FSGN , has AVF in place,  temporary right IJ dialysis catheter placed on admission and removed on 1/16 -Patient required hemodialysis in the past and he has left upper extremity AV fistula. His underlying chronic kidney disease is from FSGS/diabetes and hypertension. Acute renal insufficiency appears to be associated with sepsis/ATN.  He he required CRRT due to afib/pulmonary edema.  -good urine output, awaiting renal recovery, trend cr, temporary right IJ to be removed per nephrology - Awaiting final nephrology recommendations  #Elevated troponin likely in the setting of sepsis/aki: Trend cardiac enzymes in the setting of new onset atrial fibrillation -Echocardiogram showed vigorous left ventricular systolic function with EF of 65-30%. There were no regional wall motion abnormalities. Also with  grade 1 diastolic dysfunction. Converted to sinus rhythm, currently on ccb Both general surgery and cardiology oked with transition from heparin drip to  Coumadin on 1/15 Cardiology signed off on 1/15, outpatient follow up recommended he is new on coumadin, coumadin teaching, patient nee to have outpatient coumadin level checked.  Uncontrolled diabetes, insulin dependent Required insulin drip  initially Now on Lantus, sliding scale insulin,  Blood sugar elevated, increase insulin, taper steroids a1c 9.4  HTN; on ccb.  clonidine  Physical deconditioning Prior to hospitalization, he is independent with ADLs. PT eval, will need home health, drain care/PT if patient agrees to.  DVT prophylaxis: Heparin drip, transition to coumadin on 1/15 Code Status: Full code Family Communication: patient,  Disposition Plan: pending, need nephrology clearance, home health drain care/pt    Consultants:   Nephrology  Cardiology  critical care  General surgery  IR     Objective: BP (!) 125/55 (BP Location: Right Leg)   Pulse 75   Temp 98.8 F (37.1 C) (Oral)   Resp 18   Ht '6\' 1"'$  (1.854 m)   Wt 110.4 kg (243 lb 6.2 oz)   SpO2 95%   BMI 32.11 kg/m   Intake/Output Summary (Last 24 hours) at 09/18/16 1254 Last data filed at 09/18/16 1036  Gross per 24 hour  Intake          1315.98 ml  Output             2300 ml  Net          -984.02 ml   Filed Weights   09/15/16 0233 09/15/16 2107 09/16/16 0159  Weight: 109.1 kg (240 lb 8.4 oz) 110.4 kg (243 lb 6.2 oz) 110.4 kg (243 lb 6.2 oz)    Exam:   General:  NAD, alert and awake  Cardiovascular: RRR, no rubs  Respiratory: CTABL, no wheezes  Abdomen: Soft/ND/NT, positive BS,  perc cholecystostomy in place  Musculoskeletal: No Edema, no cyanosis  Neuro: aaox3  Data Reviewed: Basic Metabolic Panel:  Recent Labs Lab 09/14/16 0647 09/14/16 1102 09/15/16 1322 09/15/16 1323 09/16/16 0458 09/17/16 0430 09/18/16 0651  NA 133* 133* 130*  --  132* 134* 135  K 4.9 4.7 4.4  --  4.2 4.0 4.1  CL 100* 99* 95*  --  95* 99* 99*  CO2 23 23 21*  --  19* 22 23  GLUCOSE 271* 342* 492*  --  375* 191* 92  BUN 76* 81* 103*  --  111* 109* 108*  CREATININE 4.64* 4.92* 5.43*  --  5.79* 5.72* 6.10*  CALCIUM 7.7* 7.8* 7.8*  --  7.9* 7.8* 8.1*  MG 2.7*  --   --  2.5* 2.7* 2.5* 2.6*  PHOS 6.7*  --  8.3*  --  9.0* 7.8* 8.2*   Liver Function Tests:  Recent Labs Lab 09/12/16 0228  09/14/16 0647 09/15/16 1322 09/16/16 0458 09/17/16 0430  09/18/16 0651  AST 21  --   --   --   --   --   --   ALT 30  --   --   --   --   --   --   ALKPHOS 95  --   --   --   --   --   --   BILITOT 1.6*  --   --   --   --   --   --   PROT 6.4*  --   --   --   --   --   --   ALBUMIN 1.8*  1.7*  < > 1.7* 1.6* 1.7* 1.8* 1.7*  < > = values in this interval not displayed. No results for input(s): LIPASE, AMYLASE in the last 168 hours. No results for input(s): AMMONIA in the last 168 hours. CBC:  Recent Labs Lab 09/14/16 0655 09/15/16 1323 09/16/16  3354 09/17/16 0430 09/18/16 0651  WBC 16.7* 15.7* 15.4* 20.2* 19.0*  HGB 10.6* 10.6* 10.2* 11.2* 10.3*  HCT 31.7* 32.2* 30.9* 33.8* 31.7*  MCV 79.6 80.3 79.8 79.5 80.9  PLT 237 273 263 264 236   Cardiac Enzymes:   No results for input(s): CKTOTAL, CKMB, CKMBINDEX, TROPONINI in the last 168 hours. BNP (last 3 results)  Recent Labs  09/07/16 0719  BNP 101.0*    ProBNP (last 3 results) No results for input(s): PROBNP in the last 8760 hours.  CBG:  Recent Labs Lab 09/17/16 1802 09/17/16 2039 09/18/16 0658 09/18/16 0755 09/18/16 1204  GLUCAP 100* 110* 91 90 183*    Recent Results (from the past 240 hour(s))  MRSA PCR Screening     Status: None   Collection Time: 09/09/16  4:49 PM  Result Value Ref Range Status   MRSA by PCR NEGATIVE NEGATIVE Final    Comment:        The GeneXpert MRSA Assay (FDA approved for NASAL specimens only), is one component of a comprehensive MRSA colonization surveillance program. It is not intended to diagnose MRSA infection nor to guide or monitor treatment for MRSA infections.      Studies: No results found.  Scheduled Meds: . aspirin EC  81 mg Oral Daily  . cholecalciferol  1,000 Units Oral Daily  . cloNIDine  0.3 mg Oral TID  . diltiazem  240 mg Oral Daily  . insulin aspart  0-9 Units Subcutaneous TID AC & HS  . insulin aspart  5 Units Subcutaneous TID WC  . insulin glargine  50 Units Subcutaneous QHS  . pantoprazole  40 mg Oral  Daily  . saccharomyces boulardii  250 mg Oral BID  . senna-docusate  1 tablet Oral QHS  . sodium bicarbonate  1,300 mg Oral TID  . warfarin  7.5 mg Oral ONCE-1800  . Warfarin - Pharmacist Dosing Inpatient   Does not apply q1800    Continuous Infusions: . heparin 1,600 Units/hr (09/18/16 1025)   Time spent: 35 mins  Velvet Bathe MD, PhD  [facility] Pager [phone] If 7PM-7AM, please contact night-coverage at [email or web address], password [facility] 09/18/2016, 12:54 PM  LOS: 13 days

## 2016-09-19 LAB — RENAL FUNCTION PANEL
Albumin: 1.7 g/dL — ABNORMAL LOW (ref 3.5–5.0)
Anion gap: 14 (ref 5–15)
BUN: 105 mg/dL — AB (ref 6–20)
CHLORIDE: 100 mmol/L — AB (ref 101–111)
CO2: 22 mmol/L (ref 22–32)
CREATININE: 6.22 mg/dL — AB (ref 0.61–1.24)
Calcium: 7.9 mg/dL — ABNORMAL LOW (ref 8.9–10.3)
GFR calc Af Amer: 10 mL/min — ABNORMAL LOW (ref >60)
GFR calc non Af Amer: 8 mL/min — ABNORMAL LOW (ref >60)
GLUCOSE: 59 mg/dL — AB (ref 65–99)
Phosphorus: 7.3 mg/dL — ABNORMAL HIGH (ref 2.5–4.6)
Potassium: 3.9 mmol/L (ref 3.5–5.1)
Sodium: 136 mmol/L (ref 135–145)

## 2016-09-19 LAB — HEPARIN LEVEL (UNFRACTIONATED)
HEPARIN UNFRACTIONATED: 0.29 [IU]/mL — AB (ref 0.30–0.70)
HEPARIN UNFRACTIONATED: 0.2 [IU]/mL — AB (ref 0.30–0.70)

## 2016-09-19 LAB — GLUCOSE, CAPILLARY
GLUCOSE-CAPILLARY: 62 mg/dL — AB (ref 65–99)
GLUCOSE-CAPILLARY: 208 mg/dL — AB (ref 65–99)
Glucose-Capillary: 58 mg/dL — ABNORMAL LOW (ref 65–99)
Glucose-Capillary: 90 mg/dL (ref 65–99)
Glucose-Capillary: 149 mg/dL — ABNORMAL HIGH (ref 65–99)
Glucose-Capillary: 160 mg/dL — ABNORMAL HIGH (ref 65–99)

## 2016-09-19 LAB — CBC
HCT: 29.6 % — ABNORMAL LOW (ref 39.0–52.0)
Hemoglobin: 9.5 g/dL — ABNORMAL LOW (ref 13.0–17.0)
MCH: 26.3 pg (ref 26.0–34.0)
MCHC: 32.1 g/dL (ref 30.0–36.0)
MCV: 82 fL (ref 78.0–100.0)
Platelets: 237 10*3/uL (ref 150–400)
RBC: 3.61 MIL/uL — ABNORMAL LOW (ref 4.22–5.81)
RDW: 16 % — AB (ref 11.5–15.5)
WBC: 16.6 10*3/uL — AB (ref 4.0–10.5)

## 2016-09-19 LAB — HEMOGLOBIN AND HEMATOCRIT, BLOOD
HCT: 30.6 % — ABNORMAL LOW (ref 39.0–52.0)
Hemoglobin: 9.9 g/dL — ABNORMAL LOW (ref 13.0–17.0)

## 2016-09-19 LAB — MAGNESIUM: MAGNESIUM: 2.4 mg/dL (ref 1.7–2.4)

## 2016-09-19 LAB — PROTIME-INR
INR: 1.52
PROTHROMBIN TIME: 18.5 s — AB (ref 11.4–15.2)

## 2016-09-19 MED ORDER — WARFARIN SODIUM 10 MG PO TABS
10.0000 mg | ORAL_TABLET | Freq: Once | ORAL | Status: AC
  Administered 2016-09-19: 10 mg via ORAL
  Filled 2016-09-19: qty 1

## 2016-09-19 MED ORDER — HEPARIN (PORCINE) IN NACL 100-0.45 UNIT/ML-% IJ SOLN
1950.0000 [IU]/h | INTRAMUSCULAR | Status: DC
  Administered 2016-09-19: 1800 [IU]/h via INTRAVENOUS
  Administered 2016-09-20: 1950 [IU]/h via INTRAVENOUS
  Filled 2016-09-19: qty 250

## 2016-09-19 NOTE — Progress Notes (Signed)
Changed dressing on right side on neck. Large clot on dressing.   The site is clean, dry and intact- no further bleeding.  Paulla Fore, RN

## 2016-09-19 NOTE — Progress Notes (Signed)
ANTICOAGULATION CONSULT NOTE - Follow Up Consult  Pharmacy Consult for Heparin  Indication: atrial fibrillation  Allergies  Allergen Reactions  . Glyburide Other (See Comments)    unknown  . Methotrexate Derivatives Other (See Comments)    Patient cannot remember    Patient Measurements: Height: 6' 2.5" (189.2 cm) Weight: 246 lb (111.6 kg) IBW/kg (Calculated) : 83.35  Vital Signs: Temp: 99.7 F (37.6 C) (01/18 2037) BP: 112/59 (01/18 2037) Pulse Rate: 80 (01/18 2037)  Labs:  Recent Labs  09/17/16 0430  09/18/16 8372 09/18/16 1846 09/19/16 0647 09/19/16 0946 09/19/16 2203  HGB 11.2*  --  10.3*  --  9.5* 9.9*  --   HCT 33.8*  --  31.7*  --  29.6* 30.6*  --   PLT 264  --  236  --  237  --   --   LABPROT 14.8  --  16.8*  --  18.5*  --   --   INR 1.16  --  1.36  --  1.52  --   --   HEPARINUNFRC 0.17*  < > 0.18* 0.18* 0.29*  --  0.20*  CREATININE 5.72*  --  6.10*  --  6.22*  --   --   < > = values in this interval not displayed.  Estimated Creatinine Clearance: 15.6 mL/min (by C-G formula based on SCr of 6.22 mg/dL (H)).   Assessment: 42 YOM with new onset Afib with RVR.  The patient was started on Heparin on 1/10. Oral anticoagulation was held pending possible surgical plans for cholecystitis. There is no immediate surgery planned so pharmacy was consulted to add warfarin starting 1/15.   The patient's heparin level this morning was almost therapeutic at 0.29 however the patient was noted to have hematuria and bleeding on the R-side of the neck where the old catheter access used to be. Heparin was held at 0800 and the bleeding has since stabilized. Per MD - to resume Heparin at 1400 today. Will start at the previous rate since appears to achieve a therapeutic level on the lower end of the range. Repeat CBC this AM showed a stable Hgb from earlier today with AM labs, 9.9 << 9.5.   INR today remains SUBtherapeutic but is trending up nicely (INR 1.52 << 1.36, goal of  2-3).  Heparin level is low tonight, no current bleeding per RN.  Goal of Therapy:  Heparin level 0.3-0.7 units/ml Monitor platelets by anticoagulation protocol: Yes   Plan:  -Inc heparin to 1950 units/hr -0800 HL  Narda Bonds 09/19/2016,11:27 PM

## 2016-09-19 NOTE — Progress Notes (Signed)
Inpatient Diabetes Program Recommendations  AACE/ADA: New Consensus Statement on Inpatient Glycemic Control (2015)  Target Ranges:  Prepandial:   less than 140 mg/dL      Peak postprandial:   less than 180 mg/dL (1-2 hours)      Critically ill patients:  140 - 180 mg/dL   Lab Results  Component Value Date   GLUCAP 90 09/19/2016   HGBA1C 9.4 (H) 09/16/2016    Review of Glycemic Control Results for Gary Lang, Gary Lang (MRN [id number]) as of 09/19/2016 12:13  Ref. Range 09/18/2016 16:46 09/18/2016 20:12 09/19/2016 07:48 09/19/2016 08:11 09/19/2016 08:31  Glucose-Capillary Latest Ref Range: 65 - 99 mg/dL 143 (H) 123 (H) 58 (L) 62 (L) 90    Inpatient Diabetes Program Recommendations:  Noted hypoglycemia. Please consider decrease in Lantus to 45 units and D/C hs Novolog correction. Will follow.  Thank you, Nani Gasser. Pansy Ostrovsky, RN, MSN, CDE Inpatient Glycemic Control Team Team Pager [phone] (8am-5pm) 09/19/2016 12:14 PM

## 2016-09-19 NOTE — Progress Notes (Signed)
Paged doctor about pt bleeding from HD cath removal site. Applied pressure and HOB >30.   Dr. Wendee Beavers stopped Heparin Drip- to restart at 2pm.   Will continue to monitor.  Paulla Fore, RN

## 2016-09-19 NOTE — Progress Notes (Signed)
Physical Therapy Treatment Patient Details Name: TREYSON AXEL MRN: [id number] DOB: 22-Dec-1950 Today's Date: 09/19/2016    History of Present Illness pt presents from outside hospital with PNA, acute on chronic kidney disease, and Cholecystitis now s/p drain placement.  pt with hx of CKD, DM, HTN, and RA.      PT Comments    Pt reports feeling weak and without energy.  Max encouragement needed to get pt to participate and to ambulate.   Follow Up Recommendations  Home health PT;Supervision/Assistance - 24 hour     Equipment Recommendations  Rolling walker with 5" wheels;3in1 (PT)    Recommendations for Other Services       Precautions / Restrictions      Mobility  Bed Mobility Overal bed mobility: Needs Assistance Bed Mobility: Supine to Sit;Sit to Supine     Supine to sit: Min assist Sit to supine: Min guard   General bed mobility comments: HOB slightly elevated  Transfers Overall transfer level: Needs assistance Equipment used: Rolling walker (2 wheeled)   Sit to Stand: Min assist         General transfer comment: slower and effortful, minimal assist  Ambulation/Gait Ambulation/Gait assistance: Min guard Ambulation Distance (Feet): 30 Feet Assistive device: Rolling walker (2 wheeled) Gait Pattern/deviations: Trunk flexed   Gait velocity interpretation: Below normal speed for age/gender General Gait Details: slow and tentative   Stairs            Wheelchair Mobility    Modified Rankin (Stroke Patients Only)       Balance     Sitting balance-Leahy Scale: Fair     Standing balance support: Bilateral upper extremity supported;Single extremity supported Standing balance-Leahy Scale: Poor Standing balance comment: reliant on RW                    Cognition Arousal/Alertness: Awake/alert Behavior During Therapy: WFL for tasks assessed/performed Overall Cognitive Status: Within Functional Limits for tasks assessed                       Exercises      General Comments General comments (skin integrity, edema, etc.): pt's sats on RA 91% and EHR 101 bpm      Pertinent Vitals/Pain Faces Pain Scale: Hurts a little bit Pain Location: knee R Pain Descriptors / Indicators: Sore;Moaning    Home Living                      Prior Function            PT Goals (current goals can now be found in the care plan section) Acute Rehab PT Goals Patient Stated Goal: Pt wants to go home.  PT Goal Formulation: With patient Time For Goal Achievement: 09/23/16 Potential to Achieve Goals: Good Progress towards PT goals: Progressing toward goals    Frequency    Min 3X/week      PT Plan Current plan remains appropriate    Co-evaluation             End of Session           Time: 1517-6160 PT Time Calculation (min) (ACUTE ONLY): 19 min  Charges:  $Gait Training: 8-22 mins                    G CodesTessie Fass Hermilo Dutter 09/19/2016, 3:56 PM 09/19/2016  Donnella Sham, [place] [phone]  (pager)

## 2016-09-19 NOTE — Progress Notes (Signed)
PROGRESS NOTE  Gary Lang IRJ:[id number] DOB: 1950-09-15 DOA: 09/05/2016 PCP: [name], MD  Brief Summary:  Brief:   66 y.o. 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  He was admitted 09/05/16 with  E.Coli bacteremia due to acute cholecystitis. He was seen by CCS and had percutaneous drain placed 01/09 as CCS felt he was not stable enough for full surgery. Since admission, he has had worsening in renal function and also developed new onset paroxysmal atrial fib. On 1/10 Due to worsening renal function started on CVVHD (prefered over HD due to HR).  SIGNIFICANT EVENTS: 1/04 > admit sepsis > e.coli bacteremia secondary to acute cholecystitis 1/07> Percutaneous drain placed by IR 1/10 > cvvhd, fib rvr  CULTURES: Blood 01/04 > E.Coli. Gallbladder fluid 01/07 > E.Coli, K.Pneumoniae.  ANTIBIOTICS: Azithro 01/04 > 01/10 Ceftriaxone 01/04 > 01/05, 1/11 >  Zosyn 01/04 >1/11  LINES/TUBES: RIJ Vascath 1/11 > both a temporary right IJ dialysis catheter and a left radiocephalic fistula (mature) suitable for use.  Patient was admitted to [facility], transferred to ICU to have CRRT Back to [facility] service on 1/14   HPI/Recap of past 24 hours:  Pt had prolonged bleeding from catheter site as well as hematuria  Assessment/Plan: Active Problems:   Community acquired pneumonia of right lower lobe of lung ([place])   Acute respiratory failure with hypoxia ([place])   Acute cholecystitis due to biliary calculus   AKI (acute kidney injury) ([place])   Abdominal distention, non-gaseous   Acute abdominal pain   Fever   PAF (paroxysmal atrial fibrillation) ([place])  #Sepsis due to E Coli bacteremia and possibly commonly acquired pneumonia/acute cholecystitis: Chest x-ray showed right lower lobe pneumonia. Patient with tachycardia, leukocytosis, fever and abnormal x-ray finding on admission -2 out of 2 blood cultures + Escherichia coli. Drain culture with   ESCHERICHIA COLI and KLEBSIELLA PNEUMONIAE --S/p perc cholecystostomy 09/08/16.per IR needsOP follow-up in IR clinic in 5-6 weeks -general surgery oked to start coumadin, patient is to follow up with general surgery in a few weeks for elective laparoscopic cholecystectomy  - sepsis resolving, taper off steroids, finished 10 days of iv abx, d/c abx on 1/14, repeat blood culture no growth  #Acute hypoxic respiratory failure likely in the setting of pneumonia and sepsis:  - Finished abx treatment on 1/14, Improved, on room air. Encourage incentive spirometry  Blood loss anemia - reassess hgb levels - reassuring on next check - discontinue aspirin. Patient does not know why he is on it. Also he is on coumadin with heparin bridge. Will hold heparin till this afternoon. Discussed with nursing and pharmacist.  #Acute on chronic kidney disease, history of FSGN , has AVF in place,  temporary right IJ dialysis catheter placed on admission and removed on 1/16 -Patient required hemodialysis in the past and he has left upper extremity AV fistula. His underlying chronic kidney disease is from FSGS/diabetes and hypertension. Acute renal insufficiency appears to be associated with sepsis/ATN.  He he required CRRT due to afib/pulmonary edema.  -good urine output, awaiting renal recovery, trend cr, temporary right IJ to be removed per nephrology - Awaiting final nephrology recommendations  #Elevated troponin likely in the setting of sepsis/aki: Trend cardiac enzymes in the setting of new onset atrial fibrillation -Echocardiogram showed vigorous left ventricular systolic function with EF of 65-30%. There were no regional wall motion abnormalities. Also with  grade 1 diastolic dysfunction. Converted to sinus rhythm, currently on  ccb Both general surgery and cardiology oked with transition from heparin drip to  Coumadin on 1/15 Cardiology signed off on 1/15, outpatient follow up recommended he is new on  coumadin, coumadin teaching, patient nee to have outpatient coumadin level checked.  Uncontrolled diabetes, insulin dependent Required insulin drip initially Now on Lantus, sliding scale insulin,  Blood sugar elevated, increase insulin, taper steroids a1c 9.4  HTN; on ccb. clonidine  Physical deconditioning Prior to hospitalization, he is independent with ADLs. PT eval, will need home health, drain care/PT if patient agrees to.  DVT prophylaxis: Heparin drip, transition to coumadin on 1/15 Code Status: Full code Family Communication: patient,  Disposition Plan: pending resolution of bleeding, clearance by nephrology, and therapeutic INR    Consultants:   Nephrology  Cardiology  critical care  General surgery  IR     Objective: BP (!) 162/54 (BP Location: Right Leg)   Pulse 84   Temp 100 F (37.8 C) (Oral)   Resp 18   Ht 6' 2.5" (1.892 m)   Wt 111.7 kg (246 lb 3.2 oz)   SpO2 93%   BMI 31.19 kg/m   Intake/Output Summary (Last 24 hours) at 09/19/16 1700 Last data filed at 09/19/16 1242  Gross per 24 hour  Intake           876.97 ml  Output             1730 ml  Net          -853.03 ml   Filed Weights   09/15/16 2107 09/16/16 0159 09/18/16 2014  Weight: 110.4 kg (243 lb 6.2 oz) 110.4 kg (243 lb 6.2 oz) 111.7 kg (246 lb 3.2 oz)    Exam:   General:  NAD, alert and awake  Cardiovascular: RRR, no rubs  Respiratory: CTABL, no wheezes  Abdomen: Soft/ND/NT, positive BS,  perc cholecystostomy in place  Musculoskeletal: No Edema, no cyanosis  Neuro: aaox3  Data Reviewed: Basic Metabolic Panel:  Recent Labs Lab 09/15/16 1322 09/15/16 1323 09/16/16 0458 09/17/16 0430 09/18/16 0651 09/19/16 0647  NA 130*  --  132* 134* 135 136  K 4.4  --  4.2 4.0 4.1 3.9  CL 95*  --  95* 99* 99* 100*  CO2 21*  --  19* '22 23 22  '$ GLUCOSE 492*  --  375* 191* 92 59*  BUN 103*  --  111* 109* 108* 105*  CREATININE 5.43*  --  5.79* 5.72* 6.10* 6.22*    CALCIUM 7.8*  --  7.9* 7.8* 8.1* 7.9*  MG  --  2.5* 2.7* 2.5* 2.6* 2.4  PHOS 8.3*  --  9.0* 7.8* 8.2* 7.3*   Liver Function Tests:  Recent Labs Lab 09/15/16 1322 09/16/16 0458 09/17/16 0430 09/18/16 0651 09/19/16 0647  ALBUMIN 1.6* 1.7* 1.8* 1.7* 1.7*   No results for input(s): LIPASE, AMYLASE in the last 168 hours. No results for input(s): AMMONIA in the last 168 hours. CBC:  Recent Labs Lab 09/15/16 1323 09/16/16 0458 09/17/16 0430 09/18/16 0651 09/19/16 0647 09/19/16 0946  WBC 15.7* 15.4* 20.2* 19.0* 16.6*  --   HGB 10.6* 10.2* 11.2* 10.3* 9.5* 9.9*  HCT 32.2* 30.9* 33.8* 31.7* 29.6* 30.6*  MCV 80.3 79.8 79.5 80.9 82.0  --   PLT 273 263 264 236 237  --    Cardiac Enzymes:   No results for input(s): CKTOTAL, CKMB, CKMBINDEX, TROPONINI in the last 168 hours. BNP (last 3 results)  Recent Labs  09/07/16 0719  BNP 101.0*  ProBNP (last 3 results) No results for input(s): PROBNP in the last 8760 hours.  CBG:  Recent Labs Lab 09/19/16 0748 09/19/16 0811 09/19/16 0831 09/19/16 1215 09/19/16 1640  GLUCAP 58* 62* 90 149* 208*    No results found for this or any previous visit (from the past 240 hour(s)).   Studies: No results found.  Scheduled Meds: . cholecalciferol  1,000 Units Oral Daily  . cloNIDine  0.3 mg Oral TID  . diltiazem  240 mg Oral Daily  . insulin aspart  0-9 Units Subcutaneous TID AC & HS  . insulin aspart  5 Units Subcutaneous TID WC  . insulin glargine  50 Units Subcutaneous QHS  . pantoprazole  40 mg Oral Daily  . saccharomyces boulardii  250 mg Oral BID  . senna-docusate  1 tablet Oral QHS  . sodium bicarbonate  1,300 mg Oral TID  . warfarin  10 mg Oral ONCE-1800  . Warfarin - Pharmacist Dosing Inpatient   Does not apply q1800    Continuous Infusions: . heparin 1,800 Units/hr (09/19/16 1424)   Time spent: 35 mins  Velvet Bathe MD, PhD  [facility] Pager [phone] If 7PM-7AM, please contact night-coverage at  [email or web address], password [facility] 09/19/2016, 5:00 PM  LOS: 14 days

## 2016-09-19 NOTE — Progress Notes (Addendum)
CRITICAL VALUE STICKER  CRITICAL VALUE: CBG 35  RECEIVER (on-site recipient of call):  [place] NOTIFIED:  09/19/16 0755  MESSENGER (representative from lab):  MD NOTIFIED: Dr. Wendee Beavers   TIME OF NOTIFICATION:0755  RESPONSE: 0800    Pt was given apple juice and will recheck sugar.   Paulla Fore, RN

## 2016-09-19 NOTE — Progress Notes (Signed)
Assessment/ Plan:   1.Acute renal failure on chronic kidney disease stage WUJ:[id number] CKD from FSGS/diabetes and hypertension. Transiently  CRRT for acute dyspnea from pulmonary edema/worsening renal function and A. flutter with RVR.  Creatinine looks to be plateauing, hopeful for recovery.  Has a left radiocephalic fistula (mature) suitable for use.   RIJ d/c. Takes Lasix 40 mg daily at home,  Robust urine output off diuretics, no HD today. 2. Right lower lobe pneumonia/acute cholecystitis:  broad-spectrum antibiotics and s/p percutaneous cholecystostomy     3. Hyponatremia/volume overload: Hyponatremia corrected-, continue close monitoring and  limiting fluid intake to 1.5 L/day. 4. Hypertension: hypertensive agents-continue to monitor. 5. Hyperphosphatemia: continue to monitor off binders pending recovery of renal fct. 6. Atrial flutter with rapid ventricular response:now sinus rhythm,  heparin drip & warfarin  Subjective: Interval History: Sleeping this AM ; having some oozing from prior [facility] cath site  Objective: Vital signs in last 24 hours: Temp:  [98.4 F (36.9 C)-100 F (37.8 C)] 100 F (37.8 C) (01/18 1051) Pulse Rate:  [70-84] 84 (01/18 1051) Resp:  [17-18] 18 (01/18 1051) BP: (125-162)/(54-58) 162/54 (01/18 1051) SpO2:  [93 %-95 %] 93 % (01/18 1051) Weight:  [111.7 kg (246 lb 3.2 oz)] 111.7 kg (246 lb 3.2 oz) (01/17 2014) Weight change:   Intake/Output from previous day: 01/17 0701 - 01/18 0700 In: 1357 [P.O.:960; I.V.:397] Out: 1705 [Urine:1575; Drains:130] Intake/Output this shift: Total I/O In: -  Out: 575 [Urine:575]  General appearance: alert and cooperative Head: Normocephalic, without obvious abnormality, atraumatic Cardio: regular rate and rhythm, S1, S2 normal, no murmur, click, rub or gallop GI: soft, non-tender; bowel sounds normal; no masses,  no organomegaly and right cholecystotomy tube Extremitietredema tr   LUE AVF ok  Lab Results:  Recent  Labs  09/18/16 0651 09/19/16 0647 09/19/16 0946  WBC 19.0* 16.6*  --   HGB 10.3* 9.5* 9.9*  HCT 31.7* 29.6* 30.6*  PLT 236 237  --    BMET:   Recent Labs  09/18/16 0651 09/19/16 0647  NA 135 136  K 4.1 3.9  CL 99* 100*  CO2 23 22  GLUCOSE 92 59*  BUN 108* 105*  CREATININE 6.10* 6.22*  CALCIUM 8.1* 7.9*   No results for input(s): PTH in the last 72 hours. Iron Studies: No results for input(s): IRON, TIBC, TRANSFERRIN, FERRITIN in the last 72 hours. Studies/Results: No results found.  Scheduled: . cholecalciferol  1,000 Units Oral Daily  . cloNIDine  0.3 mg Oral TID  . diltiazem  240 mg Oral Daily  . insulin aspart  0-9 Units Subcutaneous TID AC & HS  . insulin aspart  5 Units Subcutaneous TID WC  . insulin glargine  50 Units Subcutaneous QHS  . pantoprazole  40 mg Oral Daily  . saccharomyces boulardii  250 mg Oral BID  . senna-docusate  1 tablet Oral QHS  . sodium bicarbonate  1,300 mg Oral TID  . warfarin  10 mg Oral ONCE-1800  . Warfarin - Pharmacist Dosing Inpatient   Does not apply q1800   Continuous: . heparin       LOS: 14 days   Gary Lang 09/19/2016,12:54 PM

## 2016-09-19 NOTE — Progress Notes (Signed)
ANTICOAGULATION CONSULT NOTE - Follow Up Consult  Pharmacy Consult for Heparin Indication: atrial fibrillation  Patient Measurements: Height: 6' 2.5" (189.2 cm) Weight: 246 lb 3.2 oz (111.7 kg) IBW/kg (Calculated) : 83.35 Heparin Dosing Weight: 103 kg  Labs:  Recent Labs  09/17/16 0430  09/18/16 0651 09/18/16 1846 09/19/16 0647 09/19/16 0946  HGB 11.2*  --  10.3*  --  9.5* 9.9*  HCT 33.8*  --  31.7*  --  29.6* 30.6*  PLT 264  --  236  --  237  --   LABPROT 14.8  --  16.8*  --  18.5*  --   INR 1.16  --  1.36  --  1.52  --   HEPARINUNFRC 0.17*  < > 0.18* 0.18* 0.29*  --   CREATININE 5.72*  --  6.10*  --  6.22*  --   < > = values in this interval not displayed.  Estimated Creatinine Clearance: 15.6 mL/min (by C-G formula based on SCr of 6.22 mg/dL (H)).   Medications:  Heparin @ 1800 units/hr - then held this AM  Assessment: 63 YOM with new onset Afib with RVR.  The patient was started on Heparin on 1/10. Oral anticoagulation was held pending possible surgical plans for cholecystitis. There is no immediate surgery planned so pharmacy was consulted to add warfarin starting 1/15.   The patient's heparin level this morning was almost therapeutic at 0.29 however the patient was noted to have hematuria and bleeding on the R-side of the neck where the old catheter access used to be. Heparin was held at 0800 and the bleeding has since stabilized. Per MD - to resume Heparin at 1400 today. Will start at the previous rate since appears to achieve a therapeutic level on the lower end of the range. Repeat CBC this AM showed a stable Hgb from earlier today with AM labs, 9.9 << 9.5.   INR today remains SUBtherapeutic but is trending up nicely (INR 1.52 << 1.36, goal of 2-3). Will give a higher dose of warfarin today to attempt to get within goal range to d/c the heparin drip.  Goal of Therapy:  INR 2-3 Heparin level 0.3-0.7 units/ml Monitor platelets by anticoagulation protocol: Yes    Plan:  1. Restart Heparin at a rate of 1800 units/hr (18 ml/hr) starting at 1400 today 2. Warfarin 10 mg x 1 dose at 1800 today 3. Will continue to monitor for any signs/symptoms of bleeding and will follow up with heparin level in 8 hours after drip restarted and PT/INR in the AM  Thank you for allowing pharmacy to be a part of this patient's care.  Alycia Rossetti, PharmD, BCPS Clinical Pharmacist Pager: [phone] Clinical phone for 09/19/2016 from 7a-3:30p: [phone] If after 3:30p, please call main pharmacy at: [phone] 09/19/2016 11:22 AM

## 2016-09-20 LAB — HEPARIN LEVEL (UNFRACTIONATED): Heparin Unfractionated: 0.33 IU/mL (ref 0.30–0.70)

## 2016-09-20 LAB — RENAL FUNCTION PANEL
Albumin: 1.6 g/dL — ABNORMAL LOW (ref 3.5–5.0)
Anion gap: 12 (ref 5–15)
BUN: 97 mg/dL — AB (ref 6–20)
CHLORIDE: 103 mmol/L (ref 101–111)
CO2: 24 mmol/L (ref 22–32)
CREATININE: 6.18 mg/dL — AB (ref 0.61–1.24)
Calcium: 7.9 mg/dL — ABNORMAL LOW (ref 8.9–10.3)
GFR calc Af Amer: 10 mL/min — ABNORMAL LOW (ref >60)
GFR calc non Af Amer: 8 mL/min — ABNORMAL LOW (ref >60)
GLUCOSE: 73 mg/dL (ref 65–99)
POTASSIUM: 4.3 mmol/L (ref 3.5–5.1)
Phosphorus: 6.3 mg/dL — ABNORMAL HIGH (ref 2.5–4.6)
Sodium: 139 mmol/L (ref 135–145)

## 2016-09-20 LAB — CBC
HEMATOCRIT: 28.3 % — AB (ref 39.0–52.0)
Hemoglobin: 9.2 g/dL — ABNORMAL LOW (ref 13.0–17.0)
MCH: 26.5 pg (ref 26.0–34.0)
MCHC: 32.5 g/dL (ref 30.0–36.0)
MCV: 81.6 fL (ref 78.0–100.0)
PLATELETS: 265 10*3/uL (ref 150–400)
RBC: 3.47 MIL/uL — ABNORMAL LOW (ref 4.22–5.81)
RDW: 16 % — AB (ref 11.5–15.5)
WBC: 13.7 10*3/uL — ABNORMAL HIGH (ref 4.0–10.5)

## 2016-09-20 LAB — GLUCOSE, CAPILLARY
GLUCOSE-CAPILLARY: 73 mg/dL (ref 65–99)
GLUCOSE-CAPILLARY: 186 mg/dL — AB (ref 65–99)
Glucose-Capillary: 111 mg/dL — ABNORMAL HIGH (ref 65–99)
Glucose-Capillary: 96 mg/dL (ref 65–99)

## 2016-09-20 LAB — PROTIME-INR
INR: 2.13
Prothrombin Time: 24.2 seconds — ABNORMAL HIGH (ref 11.4–15.2)

## 2016-09-20 LAB — MAGNESIUM: Magnesium: 2.4 mg/dL (ref 1.7–2.4)

## 2016-09-20 MED ORDER — WARFARIN SODIUM 7.5 MG PO TABS
7.5000 mg | ORAL_TABLET | Freq: Once | ORAL | Status: AC
  Administered 2016-09-20: 7.5 mg via ORAL
  Filled 2016-09-20: qty 1

## 2016-09-20 MED ORDER — FUROSEMIDE 40 MG PO TABS
40.0000 mg | ORAL_TABLET | Freq: Every day | ORAL | Status: DC
  Administered 2016-09-20 – 2016-09-23 (×4): 40 mg via ORAL
  Filled 2016-09-20 (×4): qty 1

## 2016-09-20 MED ORDER — ACETAMINOPHEN 325 MG PO TABS
650.0000 mg | ORAL_TABLET | Freq: Four times a day (QID) | ORAL | Status: DC | PRN
  Administered 2016-09-20 – 2016-09-21 (×2): 650 mg via ORAL
  Filled 2016-09-20 (×2): qty 2

## 2016-09-20 MED ORDER — NEPRO/CARBSTEADY PO LIQD
237.0000 mL | Freq: Two times a day (BID) | ORAL | Status: DC
  Administered 2016-09-21 – 2016-09-29 (×12): 237 mL via ORAL

## 2016-09-20 NOTE — Progress Notes (Signed)
PROGRESS NOTE  Gary Lang QPR:[id number] DOB: 11/12/1950 DOA: 09/05/2016 PCP: Gary Neighbors, MD  Brief Summary:  Brief:   66 y.o. 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  He was admitted 09/05/16 with  E.Coli bacteremia due to acute cholecystitis. He was seen by [facility] and had percutaneous drain placed 01/09 as [facility] felt he was not stable enough for full surgery. Since admission, he has had worsening in renal function and also developed new onset paroxysmal atrial fib. On 1/10 Due to worsening renal function started on CVVHD (prefered over HD due to HR).  SIGNIFICANT EVENTS: 1/04 > admit sepsis > e.coli bacteremia secondary to acute cholecystitis 1/07> Percutaneous drain placed by IR 1/10 > cvvhd, fib rvr  CULTURES: Blood 01/04 > E.Coli. Gallbladder fluid 01/07 > E.Coli, K.Pneumoniae.  ANTIBIOTICS: Azithro 01/04 > 01/10 Ceftriaxone 01/04 > 01/05, 1/11 >  Zosyn 01/04 >1/11  LINES/TUBES: RIJ Vascath 1/11 > both a temporary right IJ dialysis catheter and a left radiocephalic fistula (mature) suitable for use.  Patient was admitted to [facility], transferred to ICU to have CRRT Back to [facility] service on 1/14  HPI/Recap of past 24 hours:  Pt has no new complaints no acute issues overnight.  Assessment/Plan: Active Problems:   Community acquired pneumonia of right lower lobe of lung ([place])   Acute respiratory failure with hypoxia ([place])   Acute cholecystitis due to biliary calculus   AKI (acute kidney injury) ([name])   Abdominal distention, non-gaseous   Acute abdominal pain   Fever   PAF (paroxysmal atrial fibrillation) ([place])  #Sepsis due to E Coli bacteremia and possibly commonly acquired pneumonia/acute cholecystitis: Chest x-ray showed right lower lobe pneumonia. Patient with tachycardia, leukocytosis, fever and abnormal x-ray finding on admission -2 out of 2 blood cultures + Escherichia coli. Drain culture with  ESCHERICHIA COLI  and KLEBSIELLA PNEUMONIAE --S/p perc cholecystostomy 09/08/16.per IR needsOP follow-up in IR clinic in 5-6 weeks -general surgery oked to start coumadin, patient is to follow up with general surgery in a few weeks for elective laparoscopic cholecystectomy  - sepsis resolving, taper off steroids, finished 10 days of iv abx, d/c abx on 1/14, repeat blood culture no growth  #Acute hypoxic respiratory failure likely in the setting of pneumonia and sepsis:  - Finished abx treatment on 1/14, Improved, on room air. Encourage incentive spirometry  Blood loss anemia - reassess hgb levels - reassuring on next check - discontinue aspirin. Patient does not know why he is on it. Also he is on coumadin with heparin bridge. Will hold heparin till this afternoon. Discussed with nursing and pharmacist.  #Acute on chronic kidney disease, history of FSGN , has AVF in place,   - Awaiting final nephrology recommendations  #Elevated troponin likely in the setting of sepsis/aki: Trend cardiac enzymes in the setting of new onset atrial fibrillation -Echocardiogram showed vigorous left ventricular systolic function with EF of 65-30%. There were no regional wall motion abnormalities. Also with  grade 1 diastolic dysfunction. Converted to sinus rhythm, currently on ccb Both general surgery and cardiology oked with transition from heparin drip to  Coumadin on 1/15 Cardiology signed off on 1/15, outpatient follow up recommended he is new on coumadin, coumadin teaching, patient nee to have outpatient coumadin level checked.  Uncontrolled diabetes, insulin dependent Required insulin drip initially Now on Lantus, sliding scale insulin,  Blood sugar elevated, increase insulin, taper steroids a1c 9.4  HTN; on ccb. clonidine  Physical  deconditioning Prior to hospitalization, he is independent with ADLs. PT eval, will need home health, drain care/PT if patient agrees to.  DVT prophylaxis: Heparin drip, transition  to coumadin on 1/15 Code Status: Full code Family Communication: patient,  Disposition Plan: pending resolution of bleeding, clearance by nephrology, and therapeutic INR    Consultants:   Nephrology  Cardiology  critical care  General surgery  IR     Objective: BP (!) 149/56 (BP Location: Right Leg)   Pulse 72   Temp 98.7 F (37.1 C) (Oral)   Resp 17   Ht 6' 2.5" (1.892 m)   Wt 111.6 kg (246 lb)   SpO2 94%   BMI 31.16 kg/m   Intake/Output Summary (Last 24 hours) at 09/20/16 1746 Last data filed at 09/20/16 1723  Gross per 24 hour  Intake          1332.93 ml  Output             2435 ml  Net         -1102.07 ml   Filed Weights   09/16/16 0159 09/18/16 2014 09/19/16 2037  Weight: 110.4 kg (243 lb 6.2 oz) 111.7 kg (246 lb 3.2 oz) 111.6 kg (246 lb)    Exam:   General:  NAD, alert and awake  Cardiovascular: RRR, no rubs  Respiratory: CTABL, no wheezes  Abdomen: Soft/ND/NT, positive BS,  perc cholecystostomy in place  Musculoskeletal: No Edema, no cyanosis  Neuro: aaox3  Data Reviewed: Basic Metabolic Panel:  Recent Labs Lab 09/16/16 0458 09/17/16 0430 09/18/16 0651 09/19/16 0647 09/20/16 0746  NA 132* 134* 135 136 139  K 4.2 4.0 4.1 3.9 4.3  CL 95* 99* 99* 100* 103  CO2 19* '22 23 22 24  '$ GLUCOSE 375* 191* 92 59* 73  BUN 111* 109* 108* 105* 97*  CREATININE 5.79* 5.72* 6.10* 6.22* 6.18*  CALCIUM 7.9* 7.8* 8.1* 7.9* 7.9*  MG 2.7* 2.5* 2.6* 2.4 2.4  PHOS 9.0* 7.8* 8.2* 7.3* 6.3*   Liver Function Tests:  Recent Labs Lab 09/16/16 0458 09/17/16 0430 09/18/16 0651 09/19/16 0647 09/20/16 0746  ALBUMIN 1.7* 1.8* 1.7* 1.7* 1.6*   No results for input(s): LIPASE, AMYLASE in the last 168 hours. No results for input(s): AMMONIA in the last 168 hours. CBC:  Recent Labs Lab 09/16/16 0458 09/17/16 0430 09/18/16 0651 09/19/16 0647 09/19/16 0946 09/20/16 0746  WBC 15.4* 20.2* 19.0* 16.6*  --  13.7*  HGB 10.2* 11.2* 10.3* 9.5* 9.9*  9.2*  HCT 30.9* 33.8* 31.7* 29.6* 30.6* 28.3*  MCV 79.8 79.5 80.9 82.0  --  81.6  PLT 263 264 236 237  --  265   Cardiac Enzymes:   No results for input(s): CKTOTAL, CKMB, CKMBINDEX, TROPONINI in the last 168 hours. BNP (last 3 results)  Recent Labs  09/07/16 0719  BNP 101.0*    ProBNP (last 3 results) No results for input(s): PROBNP in the last 8760 hours.  CBG:  Recent Labs Lab 09/19/16 1640 09/19/16 2039 09/20/16 0803 09/20/16 1148 09/20/16 1636  GLUCAP 208* 160* 73 111* 96    No results found for this or any previous visit (from the past 240 hour(s)).   Studies: No results found.  Scheduled Meds: . cholecalciferol  1,000 Units Oral Daily  . cloNIDine  0.3 mg Oral TID  . diltiazem  240 mg Oral Daily  . [START ON 09/21/2016] feeding supplement (NEPRO CARB STEADY)  237 mL Oral BID BM  . furosemide  40 mg Oral  Daily  . insulin aspart  0-9 Units Subcutaneous TID AC & HS  . insulin aspart  5 Units Subcutaneous TID WC  . insulin glargine  50 Units Subcutaneous QHS  . pantoprazole  40 mg Oral Daily  . saccharomyces boulardii  250 mg Oral BID  . senna-docusate  1 tablet Oral QHS  . sodium bicarbonate  1,300 mg Oral TID  . warfarin  7.5 mg Oral ONCE-1800  . Warfarin - Pharmacist Dosing Inpatient   Does not apply q1800    Continuous Infusions:  Time spent: 35 mins  Velvet Bathe MD, PhD  [facility] Pager [phone] If 7PM-7AM, please contact night-coverage at [email or web address], password [facility] 09/20/2016, 5:46 PM  LOS: 15 days

## 2016-09-20 NOTE — Progress Notes (Signed)
ANTICOAGULATION CONSULT NOTE - Follow Up Consult  Pharmacy Consult for warfarin Indication: atrial fibrillation  Patient Measurements: Height: 6' 2.5" (189.2 cm) Weight: 246 lb (111.6 kg) IBW/kg (Calculated) : 83.35 Heparin Dosing Weight: 103 kg  Labs:  Recent Labs  09/18/16 0651  09/19/16 0647 09/19/16 0946 09/19/16 2203 09/20/16 0746  HGB 10.3*  --  9.5* 9.9*  --  9.2*  HCT 31.7*  --  29.6* 30.6*  --  28.3*  PLT 236  --  237  --   --  265  LABPROT 16.8*  --  18.5*  --   --  24.2*  INR 1.36  --  1.52  --   --  2.13  HEPARINUNFRC 0.18*  < > 0.29*  --  0.20* 0.33  CREATININE 6.10*  --  6.22*  --   --  6.18*  < > = values in this interval not displayed.  Estimated Creatinine Clearance: 15.7 mL/min (by C-G formula based on SCr of 6.18 mg/dL (H)).   Assessment: 50 YOM with new onset Afib with RVR.  The patient was started on Heparin on 1/10. Oral anticoagulation was held pending possible surgical plans for cholecystitis. There is no immediate surgery planned so pharmacy was consulted to add warfarin starting 1/15.   INR therapeutic today at 2.13 >> 1.52 with large increase from yesterday. Discussed heparin drip with Dr. Wendee Beavers and we agreed to discontinue this. Noted to have hematuria and bleeding on the R-side of the neck where the old catheter access used to be on 1/18, no report of bleeding in rounds today. Hgb stable in 9's, platelets are normal.  Goal of Therapy:  INR 2-3 Monitor platelets by anticoagulation protocol: Yes   Plan:  - Reduce warfarin to 7.5 mg PO tonight - Daily INR - Monitor for s/sx of bleeding   Thank you for allowing pharmacy to be a part of this patient's care.  Renold Genta, PharmD, BCPS Clinical Pharmacist Phone for today - [place] - [phone] 09/20/2016 10:47 AM

## 2016-09-20 NOTE — Care Management Important Message (Signed)
Important Message  Patient Details  Name: Gary Lang MRN: [id number] Date of Birth: 08/01/1951   Medicare Important Message Given:  Yes    Orbie Pyo 09/20/2016, 1:26 PM

## 2016-09-20 NOTE — Progress Notes (Signed)
Initial Nutrition Assessment  DOCUMENTATION CODES:   Obesity unspecified  INTERVENTION:  Nepro shake twice daily (once can provides 425 calories and 19.1 grams protein).   NUTRITION DIAGNOSIS:   Inadequate oral intake related to poor appetite as evidenced by per patient/family report.    GOAL:   Patient will meet greater than or equal to 90% of their needs    MONITOR:   PO intake, Supplement acceptance, Weight trends, Skin, I & O's, Labs  REASON FOR ASSESSMENT:   Malnutrition Screening Tool    ASSESSMENT:   66 YO male with CKD Stage III from GSGS/diabetes and hypertension. Pt admitted with community acquired pneumonia of rt lower lobe of lung. Patient started on CVVHD due to worsening renal function on 1/10 but has since discontinued.  Pt was sleeping prior to nutrition consult. Pt stated appetite had been poor since admission, but was good prior to admission. Pt did not know UBW but felt he had lost weight recently, per weight loss no weight loss was observed. Noted pt is fluid overloaded. NFPE found pt well nourished. Pt stated he had tried Nepro in the past and would be willing to try it again. He had soup and grilled cheese for lunch. Nepro twice daily was ordered to provide adequate nutrition (extra calories and protein).    Meds: Pt takes 40 mg Lasix daily at home, Catapres 0.3 mg 3 times/day, Lasix 40 mg daily, novoLOG injection 0-9 units 3 times/day before meals and bedtime, Lantus injection 50 units daily, Senakot 1 table daily.  Labs: CBG 111, BUN 97, Creatinine 6.18, Phosphorus 6.3, Albumin 1.6   Diet Order:  Diet Carb Modified Fluid consistency: Thin; Room service appropriate? Yes  Skin:  Reviewed, no issues  Last BM:  1/18  Height:   Ht Readings from Last 1 Encounters:  09/18/16 6' 2.5" (1.892 m)    Weight:   Wt Readings from Last 1 Encounters:  09/19/16 246 lb (111.6 kg)    Ideal Body Weight:  86.36 kg  BMI:  Body mass index is 31.16  kg/m.  Estimated Nutritional Needs:   Kcal:  1900-2100  Protein:  110-130 grams (1-1.2g/kg)  Fluid:  </= 1.5 L  EDUCATION NEEDS:   No education needs identified at this time  Juliann Pulse M.S. Nutrition Dietetic Intern

## 2016-09-20 NOTE — Progress Notes (Signed)
Assessment/ Plan:   1.Acute renal failure on chronic kidney disease stage POE:UMPNTIRWER CKD from FSGS/diabetes and hypertension. Transiently  CRRT for acute dyspnea from pulmonary edema/worsening renal function and A. flutter with RVR.  Creatinine looks to be plateauing, hopeful for recovery.  Has a left radiocephalic fistula (mature) suitable for use.   restart Lasix 40 mg daily 2. Right lower lobe pneumonia/acute cholecystitis:  S/p broad-spectrum antibiotics and s/p percutaneous cholecystostomy     3. Hyponatremia/volume overload: Hyponatremia corrected-, continue close monitoring and  limiting fluid intake to 1.5 L/day. 4. Hypertension: hypertensive agents-continue to monitor. 5. Hyperphosphatemia: continue to monitor off binders pending recovery of renal fct. 6. Atrial flutter with rapid ventricular response:now sinus rhythm,  heparin drip & warfarin  Subjective: Interval History: no acute events.    Objective: Vital signs in last 24 hours: Temp:  [98.2 F (36.8 C)-99.7 F (37.6 C)] 99.4 F (37.4 C) (01/19 0831) Pulse Rate:  [80-89] 89 (01/19 0831) Resp:  [18-19] 18 (01/19 0831) BP: (112-173)/(48-59) 154/58 (01/19 1116) SpO2:  [93 %-97 %] 93 % (01/19 0831) Weight:  [111.6 kg (246 lb)] 111.6 kg (246 lb) (01/18 2037) Weight change: -0.091 kg (-3.2 oz)  Intake/Output from previous day: 01/18 0701 - 01/19 0700 In: 1350.5 [P.O.:820; I.V.:290.5] Out: 1960 [Urine:1775; Drains:185] Intake/Output this shift: Total I/O In: 240 [P.O.:240] Out: 575 [Urine:575]  General appearance: alert and cooperative Head: Normocephalic, without obvious abnormality, atraumatic Cardio: regular rate and rhythm, S1, S2 normal, no murmur, click, rub or gallop GI: soft, non-tender; bowel sounds normal; no masses,  no organomegaly and right cholecystotomy tube Extremitietredema tr   LUE AVF ok  Lab Results:  Recent Labs  09/19/16 0647 09/19/16 0946 09/20/16 0746  WBC 16.6*  --  13.7*  HGB 9.5*  9.9* 9.2*  HCT 29.6* 30.6* 28.3*  PLT 237  --  265   BMET:   Recent Labs  09/19/16 0647 09/20/16 0746  NA 136 139  K 3.9 4.3  CL 100* 103  CO2 22 24  GLUCOSE 59* 73  BUN 105* 97*  CREATININE 6.22* 6.18*  CALCIUM 7.9* 7.9*   No results for input(s): PTH in the last 72 hours. Iron Studies: No results for input(s): IRON, TIBC, TRANSFERRIN, FERRITIN in the last 72 hours. Studies/Results: No results found.  Scheduled: . cholecalciferol  1,000 Units Oral Daily  . cloNIDine  0.3 mg Oral TID  . diltiazem  240 mg Oral Daily  . furosemide  40 mg Oral Daily  . insulin aspart  0-9 Units Subcutaneous TID AC & HS  . insulin aspart  5 Units Subcutaneous TID WC  . insulin glargine  50 Units Subcutaneous QHS  . pantoprazole  40 mg Oral Daily  . saccharomyces boulardii  250 mg Oral BID  . senna-docusate  1 tablet Oral QHS  . sodium bicarbonate  1,300 mg Oral TID  . Warfarin - Pharmacist Dosing Inpatient   Does not apply q1800   Continuous:    LOS: 15 days   Chanda Laperle 09/20/2016,1:50 PM

## 2016-09-21 LAB — RENAL FUNCTION PANEL
ALBUMIN: 1.6 g/dL — AB (ref 3.5–5.0)
ANION GAP: 10 (ref 5–15)
BUN: 87 mg/dL — ABNORMAL HIGH (ref 6–20)
CALCIUM: 8.1 mg/dL — AB (ref 8.9–10.3)
CO2: 26 mmol/L (ref 22–32)
Chloride: 104 mmol/L (ref 101–111)
Creatinine, Ser: 6.3 mg/dL — ABNORMAL HIGH (ref 0.61–1.24)
GFR calc non Af Amer: 8 mL/min — ABNORMAL LOW (ref >60)
GFR, EST AFRICAN AMERICAN: 10 mL/min — AB (ref >60)
GLUCOSE: 71 mg/dL (ref 65–99)
PHOSPHORUS: 6.4 mg/dL — AB (ref 2.5–4.6)
Potassium: 4.4 mmol/L (ref 3.5–5.1)
SODIUM: 140 mmol/L (ref 135–145)

## 2016-09-21 LAB — CBC
HCT: 29.4 % — ABNORMAL LOW (ref 39.0–52.0)
HEMOGLOBIN: 9.3 g/dL — AB (ref 13.0–17.0)
MCH: 26.3 pg (ref 26.0–34.0)
MCHC: 31.6 g/dL (ref 30.0–36.0)
MCV: 83.1 fL (ref 78.0–100.0)
Platelets: 235 10*3/uL (ref 150–400)
RBC: 3.54 MIL/uL — AB (ref 4.22–5.81)
RDW: 16 % — ABNORMAL HIGH (ref 11.5–15.5)
WBC: 10.8 10*3/uL — AB (ref 4.0–10.5)

## 2016-09-21 LAB — GLUCOSE, CAPILLARY
GLUCOSE-CAPILLARY: 61 mg/dL — AB (ref 65–99)
GLUCOSE-CAPILLARY: 179 mg/dL — AB (ref 65–99)
Glucose-Capillary: 100 mg/dL — ABNORMAL HIGH (ref 65–99)
Glucose-Capillary: 165 mg/dL — ABNORMAL HIGH (ref 65–99)
Glucose-Capillary: 217 mg/dL — ABNORMAL HIGH (ref 65–99)

## 2016-09-21 LAB — MAGNESIUM: MAGNESIUM: 2.3 mg/dL (ref 1.7–2.4)

## 2016-09-21 LAB — PROTIME-INR
INR: 2.6
Prothrombin Time: 28.4 seconds — ABNORMAL HIGH (ref 11.4–15.2)

## 2016-09-21 MED ORDER — METHYLPREDNISOLONE SODIUM SUCC 125 MG IJ SOLR
60.0000 mg | Freq: Once | INTRAMUSCULAR | Status: AC
  Administered 2016-09-21: 60 mg via INTRAVENOUS
  Filled 2016-09-21: qty 2

## 2016-09-21 MED ORDER — CALCIUM ACETATE (PHOS BINDER) 667 MG PO CAPS
667.0000 mg | ORAL_CAPSULE | Freq: Three times a day (TID) | ORAL | Status: DC
  Administered 2016-09-21 – 2016-09-29 (×22): 667 mg via ORAL
  Filled 2016-09-21 (×23): qty 1

## 2016-09-21 MED ORDER — LEVALBUTEROL HCL 0.63 MG/3ML IN NEBU
0.6300 mg | INHALATION_SOLUTION | Freq: Once | RESPIRATORY_TRACT | Status: AC
Start: 2016-09-21 — End: 2016-09-21
  Administered 2016-09-21: 0.63 mg via RESPIRATORY_TRACT
  Filled 2016-09-21: qty 3

## 2016-09-21 MED ORDER — WARFARIN SODIUM 5 MG PO TABS
5.0000 mg | ORAL_TABLET | Freq: Once | ORAL | Status: AC
  Administered 2016-09-21: 5 mg via ORAL
  Filled 2016-09-21: qty 1

## 2016-09-21 NOTE — Progress Notes (Signed)
ANTICOAGULATION CONSULT NOTE - Follow Up Consult  Pharmacy Consult for warfarin Indication: atrial fibrillation  Patient Measurements: Height: 6' 2.5" (189.2 cm) Weight: 246 lb 0.5 oz (111.6 kg) IBW/kg (Calculated) : 83.35 Heparin Dosing Weight: 103 kg  Labs:  Recent Labs  09/19/16 0647 09/19/16 0946 09/19/16 2203 09/20/16 0746 09/21/16 0411  HGB 9.5* 9.9*  --  9.2* 9.3*  HCT 29.6* 30.6*  --  28.3* 29.4*  PLT 237  --   --  265 235  LABPROT 18.5*  --   --  24.2* 28.4*  INR 1.52  --   --  2.13 2.60  HEPARINUNFRC 0.29*  --  0.20* 0.33  --   CREATININE 6.22*  --   --  6.18* 6.30*    Estimated Creatinine Clearance: 15.4 mL/min (by C-G formula based on SCr of 6.3 mg/dL (H)).   Assessment: 9 YOM with new onset Afib with RVR.  The patient is s/p heparin drip. Oral anticoagulation was held pending possible surgical plans for cholecystitis. There is no immediate surgery planned so pharmacy was consulted to add new warfarin starting 1/15.   PTA Dose: N/A, new medication for patients   Drug/Diet Notes: getting Nepro shakes  INR therapeutic today at 2.6, which is again a large increase from yesterday (2.13). Noted to have hematuria and bleeding on the R-side of the neck where the old catheter access used to be on 1/18. Spoke with RN today, and she did not note any gross hematuria and no bleeding from catheter site noted. Hemoglobin remains stable in 9's, platelets are normal.  Given significant INR trend up for two days in a row, will give further reduced dose tonight.  Goal of Therapy:  INR 2-3 Monitor platelets by anticoagulation protocol: Yes   Plan:  - Reduce warfarin to 5 mg PO tonight - Daily INR - Monitor for s/sx of bleeding, diet change, drug interactions   Thank you for allowing pharmacy to be a part of this patient's care.  Demetrius Charity, PharmD Acute Care Pharmacy Resident  Pager: [phone] 09/21/2016

## 2016-09-21 NOTE — Plan of Care (Signed)
Problem: Respiratory: Goal: Respiratory symptoms related to disease process will be avoided Outcome: Progressing Called to patient room by spouse d/t patient wheezing. Ausculated wheezes inspiratory and expiratory wheezes bilatereally and increasing diminished breath sounds toward bases. Levalbuterol administered per MD prn order. Will continue to monitor.

## 2016-09-21 NOTE — Plan of Care (Signed)
Problem: Respiratory: Goal: Respiratory symptoms related to disease process will be avoided Outcome: Progressing Patient noted to have increased audible wheezing. Levalbuterol q 6 h, last given 1630. Dr. Wendee Beavers notified. Order received to give 60 mg solumedrol x 1 and repeat levalbuterol now. Will continue to monitor.

## 2016-09-21 NOTE — Progress Notes (Signed)
PROGRESS NOTE  Gary Lang ZJQ:[id number] DOB: 09-Apr-1951 DOA: 09/05/2016 PCP: [name], MD  Brief Summary:  Brief:   66 y.o. 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  He was admitted 09/05/16 with  E.Coli bacteremia due to acute cholecystitis. He was seen by CCS and had percutaneous drain placed 01/09 as CCS felt he was not stable enough for full surgery. Since admission, he has had worsening in renal function and also developed new onset paroxysmal atrial fib. On 1/10 Due to worsening renal function started on CVVHD (prefered over HD due to HR).  SIGNIFICANT EVENTS: 1/04 > admit sepsis > e.coli bacteremia secondary to acute cholecystitis 1/07> Percutaneous drain placed by IR 1/10 > cvvhd, fib rvr  CULTURES: Blood 01/04 > E.Coli. Gallbladder fluid 01/07 > E.Coli, K.Pneumoniae.  ANTIBIOTICS: Azithro 01/04 > 01/10 Ceftriaxone 01/04 > 01/05, 1/11 >  Zosyn 01/04 >1/11  LINES/TUBES: RIJ Vascath 1/11 > both a temporary right IJ dialysis catheter and a left radiocephalic fistula (mature) suitable for use.  Patient was admitted to [facility], transferred to ICU to have CRRT Back to [facility] service on 1/14  HPI/Recap of past 24 hours:  No new complaints reported to me today.  Assessment/Plan: Active Problems:   Community acquired pneumonia of right lower lobe of lung ([place])   Acute respiratory failure with hypoxia ([place])   Acute cholecystitis due to biliary calculus   AKI (acute kidney injury) ([place])   Abdominal distention, non-gaseous   Acute abdominal pain   Fever   PAF (paroxysmal atrial fibrillation) ([place])  #Sepsis due to E Coli bacteremia and possibly commonly acquired pneumonia/acute cholecystitis: Chest x-ray showed right lower lobe pneumonia. Patient with tachycardia, leukocytosis, fever and abnormal x-ray finding on admission -2 out of 2 blood cultures + Escherichia coli. Drain culture with  ESCHERICHIA COLI and  KLEBSIELLA PNEUMONIAE --S/p perc cholecystostomy 09/08/16.per IR needsOP follow-up in IR clinic in 5-6 weeks -general surgery oked to start coumadin, patient is to follow up with general surgery in a few weeks for elective laparoscopic cholecystectomy  - sepsis resolving, taper off steroids, finished 10 days of iv abx, d/c abx on 1/14, repeat blood culture no growth  #Acute hypoxic respiratory failure likely in the setting of pneumonia and sepsis:  - Finished abx treatment on 1/14, Improved, on room air. Encourage incentive spirometry  Blood loss anemia - reassess hgb levels - resolved with discontinuation of heparin and aspirin. Currently patient on coumadin.  #Acute on chronic kidney disease, history of FSGN , has AVF in place,   - Awaiting final nephrology recommendations  #Elevated troponin likely in the setting of sepsis/aki: Trend cardiac enzymes in the setting of new onset atrial fibrillation -Echocardiogram showed vigorous left ventricular systolic function with EF of 65-30%. There were no regional wall motion abnormalities. Also with  grade 1 diastolic dysfunction. Converted to sinus rhythm, currently on ccb Both general surgery and cardiology oked with transition from heparin drip to  Coumadin on 1/15 Cardiology signed off on 1/15, outpatient follow up recommended he is new on coumadin, coumadin teaching, patient needs to have outpatient coumadin level checked. - INR at goal today.  Uncontrolled diabetes, insulin dependent Required insulin drip initially Now on Lantus, sliding scale insulin,  Blood sugar elevated, increase insulin, taper steroids a1c 9.4  HTN; on ccb. clonidine  Physical deconditioning Prior to hospitalization, he is independent with ADLs. PT eval, will need home health, drain care/PT if patient agrees to.  DVT prophylaxis: Heparin drip, transition to coumadin on 1/15 Code Status: Full code Family Communication: patient,  Disposition Plan: pending  resolution of bleeding, clearance by nephrology, and therapeutic INR    Consultants:   Nephrology  Cardiology  critical care  General surgery  IR     Objective: BP (!) 158/47 (BP Location: Right Leg)   Pulse 89   Temp 98.7 F (37.1 C) (Oral)   Resp 18   Ht 6' 2.5" (1.892 m)   Wt 111.6 kg (246 lb 0.5 oz)   SpO2 95%   BMI 31.17 kg/m   Intake/Output Summary (Last 24 hours) at 09/21/16 1339 Last data filed at 09/21/16 1338  Gross per 24 hour  Intake             1285 ml  Output             1950 ml  Net             -665 ml   Filed Weights   09/18/16 2014 09/19/16 2037 09/20/16 2030  Weight: 111.7 kg (246 lb 3.2 oz) 111.6 kg (246 lb) 111.6 kg (246 lb 0.5 oz)    Exam:   General:  NAD, alert and awake  Cardiovascular: RRR, no rubs  Respiratory: CTABL, no wheezes  Abdomen: Soft/ND/NT, positive BS,  perc cholecystostomy in place  Musculoskeletal: No Edema, no cyanosis  Neuro: aaox3  Data Reviewed: Basic Metabolic Panel:  Recent Labs Lab 09/17/16 0430 09/18/16 0651 09/19/16 0647 09/20/16 0746 09/21/16 0411  NA 134* 135 136 139 140  K 4.0 4.1 3.9 4.3 4.4  CL 99* 99* 100* 103 104  CO2 '22 23 22 24 26  '$ GLUCOSE 191* 92 59* 73 71  BUN 109* 108* 105* 97* 87*  CREATININE 5.72* 6.10* 6.22* 6.18* 6.30*  CALCIUM 7.8* 8.1* 7.9* 7.9* 8.1*  MG 2.5* 2.6* 2.4 2.4 2.3  PHOS 7.8* 8.2* 7.3* 6.3* 6.4*   Liver Function Tests:  Recent Labs Lab 09/17/16 0430 09/18/16 0651 09/19/16 0647 09/20/16 0746 09/21/16 0411  ALBUMIN 1.8* 1.7* 1.7* 1.6* 1.6*   No results for input(s): LIPASE, AMYLASE in the last 168 hours. No results for input(s): AMMONIA in the last 168 hours. CBC:  Recent Labs Lab 09/17/16 0430 09/18/16 0651 09/19/16 0647 09/19/16 0946 09/20/16 0746 09/21/16 0411  WBC 20.2* 19.0* 16.6*  --  13.7* 10.8*  HGB 11.2* 10.3* 9.5* 9.9* 9.2* 9.3*  HCT 33.8* 31.7* 29.6* 30.6* 28.3* 29.4*  MCV 79.5 80.9 82.0  --  81.6 83.1  PLT 264 236 237  --   265 235   Cardiac Enzymes:   No results for input(s): CKTOTAL, CKMB, CKMBINDEX, TROPONINI in the last 168 hours. BNP (last 3 results)  Recent Labs  09/07/16 0719  BNP 101.0*    ProBNP (last 3 results) No results for input(s): PROBNP in the last 8760 hours.  CBG:  Recent Labs Lab 09/20/16 1636 09/20/16 2031 09/21/16 0802 09/21/16 0845 09/21/16 1232  GLUCAP 96 186* 61* 100* 165*    No results found for this or any previous visit (from the past 240 hour(s)).   Studies: No results found.  Scheduled Meds: . cholecalciferol  1,000 Units Oral Daily  . cloNIDine  0.3 mg Oral TID  . diltiazem  240 mg Oral Daily  . feeding supplement (NEPRO CARB STEADY)  237 mL Oral BID BM  . furosemide  40 mg Oral Daily  . insulin aspart  0-9 Units Subcutaneous TID AC & HS  . insulin  aspart  5 Units Subcutaneous TID WC  . insulin glargine  50 Units Subcutaneous QHS  . pantoprazole  40 mg Oral Daily  . saccharomyces boulardii  250 mg Oral BID  . senna-docusate  1 tablet Oral QHS  . sodium bicarbonate  1,300 mg Oral TID  . warfarin  5 mg Oral ONCE-1800  . Warfarin - Pharmacist Dosing Inpatient   Does not apply q1800    Continuous Infusions:  Time spent: 35 mins  Velvet Bathe MD, PhD  [facility] Pager [phone] If 7PM-7AM, please contact night-coverage at [email or web address], password [facility] 09/21/2016, 1:39 PM  LOS: 16 days

## 2016-09-21 NOTE — Progress Notes (Signed)
Assessment/ Plan:   1.Acute renal failure on chronic kidney disease stage OZD:[id number] CKD from FSGS/diabetes and hypertension. Transiently  CRRT for acute dyspnea from pulmonary edema/worsening renal function and A. flutter with RVR.  Creatinine looks to be plateauing, hopeful for recovery.  Has a left radiocephalic fistula (mature) suitable for use.   restart Lasix 40 mg daily; will arrange close followup with Dr. Florene Glen 2. Right lower lobe pneumonia/acute cholecystitis:  S/p broad-spectrum antibiotics and s/p percutaneous cholecystostomy     3. Hyponatremia/volume overload: Hyponatremia corrected-, continue close monitoring and  limiting fluid intake to 1.5 L/day. 4. Hypertension: hypertensive agents-continue to monitor. 5. Hyperphosphatemia: continue to monitor off binders pending recovery of renal fct. 6. Atrial flutter with rapid ventricular response:now sinus rhythm,  heparin drip & warfarin  Subjective: Interval History: no acute events.  INR therapeutic  Objective: Vital signs in last 24 hours: Temp:  [98.7 F (37.1 C)-99.4 F (37.4 C)] 98.7 F (37.1 C) (01/20 0851) Pulse Rate:  [72-89] 89 (01/20 0851) Resp:  [16-19] 18 (01/20 0851) BP: (112-158)/(47-64) 158/47 (01/20 0851) SpO2:  [93 %-98 %] 95 % (01/20 0851) Weight:  [111.6 kg (246 lb 0.5 oz)] 111.6 kg (246 lb 0.5 oz) (01/19 2030) Weight change: 0.015 kg (0.5 oz)  Intake/Output from previous day: 01/19 0701 - 01/20 0700 In: 1045 [P.O.:1040] Out: 2525 [Urine:2275; Drains:250] Intake/Output this shift: Total I/O In: 720 [P.O.:720] Out: -   General appearance: alert and cooperative Head: Normocephalic, without obvious abnormality, atraumatic Cardio: regular rate and rhythm, S1, S2 normal, no murmur, click, rub or gallop GI: soft, non-tender; bowel sounds normal; no masses,  no organomegaly and right cholecystotomy tube Extremitietredema tr   LUE AVF ok  Lab Results:  Recent Labs  09/20/16 0746 09/21/16 0411   WBC 13.7* 10.8*  HGB 9.2* 9.3*  HCT 28.3* 29.4*  PLT 265 235   BMET:   Recent Labs  09/20/16 0746 09/21/16 0411  NA 139 140  K 4.3 4.4  CL 103 104  CO2 24 26  GLUCOSE 73 71  BUN 97* 87*  CREATININE 6.18* 6.30*  CALCIUM 7.9* 8.1*   No results for input(s): PTH in the last 72 hours. Iron Studies: No results for input(s): IRON, TIBC, TRANSFERRIN, FERRITIN in the last 72 hours. Studies/Results: No results found.  Scheduled: . calcium acetate  667 mg Oral TID WC  . cholecalciferol  1,000 Units Oral Daily  . cloNIDine  0.3 mg Oral TID  . diltiazem  240 mg Oral Daily  . feeding supplement (NEPRO CARB STEADY)  237 mL Oral BID BM  . furosemide  40 mg Oral Daily  . insulin aspart  0-9 Units Subcutaneous TID AC & HS  . insulin aspart  5 Units Subcutaneous TID WC  . insulin glargine  50 Units Subcutaneous QHS  . pantoprazole  40 mg Oral Daily  . saccharomyces boulardii  250 mg Oral BID  . senna-docusate  1 tablet Oral QHS  . sodium bicarbonate  1,300 mg Oral TID  . warfarin  5 mg Oral ONCE-1800  . Warfarin - Pharmacist Dosing Inpatient   Does not apply q1800   Continuous:    LOS: 16 days   Gary Lang 09/21/2016,2:02 PM

## 2016-09-21 NOTE — Plan of Care (Signed)
Problem: Metabolic: Goal: Ability to maintain appropriate glucose levels will improve Outcome: Progressing CBG 61 this morning. Patient alert and oriented. Provided nepro and breakfast. Recheck 100. Parameters not met for sliding scale or meal coverage insulin. Will continue to monitor.

## 2016-09-22 LAB — MAGNESIUM: Magnesium: 2.5 mg/dL — ABNORMAL HIGH (ref 1.7–2.4)

## 2016-09-22 LAB — CBC
HCT: 27.9 % — ABNORMAL LOW (ref 39.0–52.0)
HEMOGLOBIN: 8.8 g/dL — AB (ref 13.0–17.0)
MCH: 26.5 pg (ref 26.0–34.0)
MCHC: 31.5 g/dL (ref 30.0–36.0)
MCV: 84 fL (ref 78.0–100.0)
Platelets: 231 10*3/uL (ref 150–400)
RBC: 3.32 MIL/uL — AB (ref 4.22–5.81)
RDW: 16 % — ABNORMAL HIGH (ref 11.5–15.5)
WBC: 8.2 10*3/uL (ref 4.0–10.5)

## 2016-09-22 LAB — RENAL FUNCTION PANEL
ANION GAP: 11 (ref 5–15)
Albumin: 1.7 g/dL — ABNORMAL LOW (ref 3.5–5.0)
BUN: 86 mg/dL — ABNORMAL HIGH (ref 6–20)
CHLORIDE: 102 mmol/L (ref 101–111)
CO2: 22 mmol/L (ref 22–32)
Calcium: 8.1 mg/dL — ABNORMAL LOW (ref 8.9–10.3)
Creatinine, Ser: 5.9 mg/dL — ABNORMAL HIGH (ref 0.61–1.24)
GFR calc Af Amer: 10 mL/min — ABNORMAL LOW (ref >60)
GFR calc non Af Amer: 9 mL/min — ABNORMAL LOW (ref >60)
Glucose, Bld: 326 mg/dL — ABNORMAL HIGH (ref 65–99)
POTASSIUM: 5.1 mmol/L (ref 3.5–5.1)
Phosphorus: 6.2 mg/dL — ABNORMAL HIGH (ref 2.5–4.6)
SODIUM: 135 mmol/L (ref 135–145)

## 2016-09-22 LAB — PROTIME-INR
INR: 2.91
Prothrombin Time: 31 seconds — ABNORMAL HIGH (ref 11.4–15.2)

## 2016-09-22 LAB — GLUCOSE, CAPILLARY
Glucose-Capillary: 208 mg/dL — ABNORMAL HIGH (ref 65–99)
Glucose-Capillary: 280 mg/dL — ABNORMAL HIGH (ref 65–99)
Glucose-Capillary: 285 mg/dL — ABNORMAL HIGH (ref 65–99)
Glucose-Capillary: 331 mg/dL — ABNORMAL HIGH (ref 65–99)

## 2016-09-22 MED ORDER — WARFARIN SODIUM 2 MG PO TABS
2.0000 mg | ORAL_TABLET | Freq: Once | ORAL | Status: AC
  Administered 2016-09-22: 2 mg via ORAL
  Filled 2016-09-22: qty 1

## 2016-09-22 MED ORDER — FUROSEMIDE 40 MG PO TABS
40.0000 mg | ORAL_TABLET | Freq: Once | ORAL | Status: AC
  Administered 2016-09-22: 40 mg via ORAL
  Filled 2016-09-22: qty 1

## 2016-09-22 NOTE — Progress Notes (Signed)
Assessment/ Plan:   1.Acute renal failure on chronic kidney disease stage PYP:[id number] CKD from FSGS/diabetes and hypertension. Transiently  CRRT for acute dyspnea from pulmonary edema/worsening renal function and A. flutter with RVR.  Creatinine looks to be plateauing, hopeful for recovery.  Has a left radiocephalic fistula (mature) suitable for use.   restart Lasix 40 mg daily; will give an extra dose of 40 PO today in case some wheezing is volume mediated.  Will need to arrange close followup with Dr. Florene Glen 2. Right lower lobe pneumonia/acute cholecystitis:  S/p broad-spectrum antibiotics and s/p percutaneous cholecystostomy; some wheezing today and was given solumedrol and nebs.  Lasix as above   3. Hyponatremia/volume overload: Hyponatremia corrected-, continue close monitoring and  limiting fluid intake to 1.5 L/day. 4. Hypertension: hypertensive agents-continue to monitor. 5. Hyperphosphatemia: resumed phoslo 1 tab TID with meals 6. Atrial flutter with rapid ventricular response:now sinus rhythm,  heparin drip & warfarin 7.  Metabolic acidosis: on sodium bicarb 1300 TID  Subjective: Having some more wheezing overnight, got some albuterol and some solumedrol.  Low-grade temp.  Wants to leave.    Objective: Vital signs in last 24 hours: Temp:  [98.1 F (36.7 C)-100.8 F (38.2 C)] 98.4 F (36.9 C) (01/21 1110) Pulse Rate:  [67-90] 76 (01/21 1110) Resp:  [18-24] 18 (01/21 1110) BP: (130-163)/(54-64) 144/64 (01/21 1110) SpO2:  [93 %-99 %] 93 % (01/21 1110) FiO2 (%):  [28 %] 28 % (01/20 1635) Weight:  [109.7 kg (241 lb 12.8 oz)] 109.7 kg (241 lb 12.8 oz) (01/20 2000) Weight change: -1.92 kg (-4 lb 3.7 oz)  Intake/Output from previous day: 01/20 0701 - 01/21 0700 In: 1560 [P.O.:1560] Out: 2050 [Urine:1900; Drains:150] Intake/Output this shift: Total I/O In: 240 [P.O.:240] Out: -   GEN: NAD, sitting in bed, wife at bedside HEENT: EOMI, PERRL NECK: no overt JVD PULM: mildly  increased WOB, some expiratory wheezes CV RRR ABD soft, nondistended EXT trace LE edema NEURO AAO x 3  Lab Results:  Recent Labs  09/21/16 0411 09/22/16 0418  WBC 10.8* 8.2  HGB 9.3* 8.8*  HCT 29.4* 27.9*  PLT 235 231   BMET:   Recent Labs  09/20/16 0746 09/21/16 0411  NA 139 140  K 4.3 4.4  CL 103 104  CO2 24 26  GLUCOSE 73 71  BUN 97* 87*  CREATININE 6.18* 6.30*  CALCIUM 7.9* 8.1*   No results for input(s): PTH in the last 72 hours. Iron Studies: No results for input(s): IRON, TIBC, TRANSFERRIN, FERRITIN in the last 72 hours. Studies/Results: No results found.  Scheduled: . calcium acetate  667 mg Oral TID WC  . cholecalciferol  1,000 Units Oral Daily  . cloNIDine  0.3 mg Oral TID  . diltiazem  240 mg Oral Daily  . feeding supplement (NEPRO CARB STEADY)  237 mL Oral BID BM  . furosemide  40 mg Oral Daily  . insulin aspart  0-9 Units Subcutaneous TID AC & HS  . insulin aspart  5 Units Subcutaneous TID WC  . insulin glargine  50 Units Subcutaneous QHS  . pantoprazole  40 mg Oral Daily  . saccharomyces boulardii  250 mg Oral BID  . senna-docusate  1 tablet Oral QHS  . sodium bicarbonate  1,300 mg Oral TID  . warfarin  2 mg Oral ONCE-1800  . Warfarin - Pharmacist Dosing Inpatient   Does not apply q1800   Continuous:    LOS: 17 days   Mirella Gueye 09/22/2016,1:02 PM

## 2016-09-22 NOTE — Progress Notes (Signed)
PROGRESS NOTE  Gary Lang OHY:[id number] DOB: 03-02-1951 DOA: 09/05/2016 PCP: [name], MD  Brief Summary:  Brief:   66 y.o. 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  He was admitted 09/05/16 with  E.Coli bacteremia due to acute cholecystitis. He was seen by CCS and had percutaneous drain placed 01/09 as CCS felt he was not stable enough for full surgery. Since admission, he has had worsening in renal function and also developed new onset paroxysmal atrial fib. On 1/10 Due to worsening renal function started on CVVHD (prefered over HD due to HR).  SIGNIFICANT EVENTS: 1/04 > admit sepsis > e.coli bacteremia secondary to acute cholecystitis 1/07> Percutaneous drain placed by IR 1/10 > cvvhd, fib rvr  CULTURES: Blood 01/04 > E.Coli. Gallbladder fluid 01/07 > E.Coli, K.Pneumoniae.  ANTIBIOTICS: Azithro 01/04 > 01/10 Ceftriaxone 01/04 > 01/05, 1/11 >  Zosyn 01/04 >1/11  LINES/TUBES: RIJ Vascath 1/11 > both a temporary right IJ dialysis catheter and a left radiocephalic fistula (mature) suitable for use.  Patient was admitted to [facility], transferred to ICU to have CRRT Back to [facility] service on 1/14  HPI/Recap of past 24 hours:  Pt looking forward to going home soon.  Assessment/Plan: Active Problems:   Community acquired pneumonia of right lower lobe of lung ([place])   Acute respiratory failure with hypoxia ([place])   Acute cholecystitis due to biliary calculus   AKI (acute kidney injury) ([place])   Abdominal distention, non-gaseous   Acute abdominal pain   Fever   PAF (paroxysmal atrial fibrillation) ([place])  #Sepsis due to E Coli bacteremia and possibly commonly acquired pneumonia/acute cholecystitis: Chest x-ray showed right lower lobe pneumonia. Patient with tachycardia, leukocytosis, fever and abnormal x-ray finding on admission -2 out of 2 blood cultures + Escherichia coli. Drain culture with  ESCHERICHIA COLI and  KLEBSIELLA PNEUMONIAE --S/p perc cholecystostomy 09/08/16.per IR needsOP follow-up in IR clinic in 5-6 weeks -general surgery oked to start coumadin, patient is to follow up with general surgery in a few weeks for elective laparoscopic cholecystectomy  - sepsis resolving, taper off steroids, finished 10 days of iv abx, d/c abx on 1/14, repeat blood culture no growth  #Acute hypoxic respiratory failure likely in the setting of pneumonia and sepsis:  - Finished abx treatment on 1/14, Improved, on room air. Encourage incentive spirometry - had some wheezes which improved with solumedrol. Renal to administer extra lasix dose today.  Blood loss anemia - reassess hgb levels - resolved with discontinuation of heparin and aspirin. Currently patient on coumadin.  #Acute on chronic kidney disease, history of FSGN , has AVF in place,   - Awaiting final nephrology recommendations  #Elevated troponin likely in the setting of sepsis/aki: Trend cardiac enzymes in the setting of new onset atrial fibrillation -Echocardiogram showed vigorous left ventricular systolic function with EF of 65-30%. There were no regional wall motion abnormalities. Also with  grade 1 diastolic dysfunction. Converted to sinus rhythm, currently on ccb Both general surgery and cardiology oked with transition from heparin drip to  Coumadin on 1/15 Cardiology signed off on 1/15, outpatient follow up recommended he is new on coumadin, coumadin teaching, patient needs to have outpatient coumadin level checked. - INR at goal today.  Uncontrolled diabetes, insulin dependent Required insulin drip initially Now on Lantus, sliding scale insulin,  Blood sugar elevated, increase insulin, taper steroids a1c 9.4  HTN; on ccb. clonidine  Physical deconditioning Prior to hospitalization, he is  independent with ADLs. PT eval, will need home health, drain care/PT if patient agrees to.  DVT prophylaxis: on coumadin Code Status: Full  code Family Communication: patient and wife Disposition Plan: d/c most likely next am if ok with nephrology    Consultants:   Nephrology  Cardiology  critical care  General surgery  IR     Objective: BP (!) 144/64 (BP Location: Right Leg)   Pulse 76   Temp 98.4 F (36.9 C) (Oral)   Resp 18   Ht 6' 2.5" (1.892 m)   Wt 109.7 kg (241 lb 12.8 oz)   SpO2 93%   BMI 30.63 kg/m   Intake/Output Summary (Last 24 hours) at 09/22/16 1556 Last data filed at 09/22/16 1402  Gross per 24 hour  Intake              840 ml  Output             2200 ml  Net            -1360 ml   Filed Weights   09/19/16 2037 09/20/16 2030 09/21/16 2000  Weight: 111.6 kg (246 lb) 111.6 kg (246 lb 0.5 oz) 109.7 kg (241 lb 12.8 oz)    Exam:   General:  NAD, alert and awake  Cardiovascular: RRR, no rubs  Respiratory: CTABL, no wheezes  Abdomen: Soft/ND/NT, positive BS,  perc cholecystostomy in place  Musculoskeletal: No Edema, no cyanosis  Neuro: aaox3  Data Reviewed: Basic Metabolic Panel:  Recent Labs Lab 09/18/16 0651 09/19/16 0647 09/20/16 0746 09/21/16 0411 09/22/16 0418 09/22/16 1326  NA 135 136 139 140  --  135  K 4.1 3.9 4.3 4.4  --  5.1  CL 99* 100* 103 104  --  102  CO2 '23 22 24 26  '$ --  22  GLUCOSE 92 59* 73 71  --  326*  BUN 108* 105* 97* 87*  --  86*  CREATININE 6.10* 6.22* 6.18* 6.30*  --  5.90*  CALCIUM 8.1* 7.9* 7.9* 8.1*  --  8.1*  MG 2.6* 2.4 2.4 2.3 2.5*  --   PHOS 8.2* 7.3* 6.3* 6.4*  --  6.2*   Liver Function Tests:  Recent Labs Lab 09/18/16 0651 09/19/16 0647 09/20/16 0746 09/21/16 0411 09/22/16 1326  ALBUMIN 1.7* 1.7* 1.6* 1.6* 1.7*   No results for input(s): LIPASE, AMYLASE in the last 168 hours. No results for input(s): AMMONIA in the last 168 hours. CBC:  Recent Labs Lab 09/18/16 0651 09/19/16 0647 09/19/16 0946 09/20/16 0746 09/21/16 0411 09/22/16 0418  WBC 19.0* 16.6*  --  13.7* 10.8* 8.2  HGB 10.3* 9.5* 9.9* 9.2* 9.3*  8.8*  HCT 31.7* 29.6* 30.6* 28.3* 29.4* 27.9*  MCV 80.9 82.0  --  81.6 83.1 84.0  PLT 236 237  --  265 235 231   Cardiac Enzymes:   No results for input(s): CKTOTAL, CKMB, CKMBINDEX, TROPONINI in the last 168 hours. BNP (last 3 results)  Recent Labs  09/07/16 0719  BNP 101.0*    ProBNP (last 3 results) No results for input(s): PROBNP in the last 8760 hours.  CBG:  Recent Labs Lab 09/21/16 1232 09/21/16 1722 09/21/16 2024 09/22/16 0801 09/22/16 1223  GLUCAP 165* 217* 179* 208* 280*    No results found for this or any previous visit (from the past 240 hour(s)).   Studies: No results found.  Scheduled Meds: . calcium acetate  667 mg Oral TID WC  . cholecalciferol  1,000 Units Oral  Daily  . cloNIDine  0.3 mg Oral TID  . diltiazem  240 mg Oral Daily  . feeding supplement (NEPRO CARB STEADY)  237 mL Oral BID BM  . furosemide  40 mg Oral Daily  . insulin aspart  0-9 Units Subcutaneous TID AC & HS  . insulin aspart  5 Units Subcutaneous TID WC  . insulin glargine  50 Units Subcutaneous QHS  . pantoprazole  40 mg Oral Daily  . saccharomyces boulardii  250 mg Oral BID  . senna-docusate  1 tablet Oral QHS  . sodium bicarbonate  1,300 mg Oral TID  . warfarin  2 mg Oral ONCE-1800  . Warfarin - Pharmacist Dosing Inpatient   Does not apply q1800    Continuous Infusions:  Time spent: 35 mins  Velvet Bathe MD, PhD  [facility] Pager [phone] If 7PM-7AM, please contact night-coverage at [email or web address], password [facility] 09/22/2016, 3:56 PM  LOS: 17 days

## 2016-09-22 NOTE — Progress Notes (Signed)
ANTICOAGULATION CONSULT NOTE - Follow Up Consult  Pharmacy Consult for warfarin Indication: atrial fibrillation  Patient Measurements: Height: 6' 2.5" (189.2 cm) Weight: 241 lb 12.8 oz (109.7 kg) IBW/kg (Calculated) : 83.35 Heparin Dosing Weight: 103 kg  Labs:  Recent Labs  09/19/16 2203 09/20/16 0746 09/21/16 0411 09/22/16 0418  HGB  --  9.2* 9.3* 8.8*  HCT  --  28.3* 29.4* 27.9*  PLT  --  265 235 231  LABPROT  --  24.2* 28.4* 31.0*  INR  --  2.13 2.60 2.91  HEPARINUNFRC 0.20* 0.33  --   --   CREATININE  --  6.18* 6.30*  --     Estimated Creatinine Clearance: 15.3 mL/min (by C-G formula based on SCr of 6.3 mg/dL (H)).   Assessment: 85 YOM with new onset Afib with RVR.  The patient is s/p heparin drip. Oral anticoagulation was held pending possible surgical plans for cholecystitis. There is no immediate surgery planned so pharmacy was consulted to add new warfarin starting 1/15.   PTA Dose: N/A, new medication for patient   Drug/Diet Notes: getting Nepro shakes  INR therapeutic today at 2.91, which is again an increase from yesterday. Noted to have hematuria and bleeding on the R-side of the neck where the old catheter access used to be on 1/18. No bleeding noted. Hemoglobin remains stable in 9's, platelets are normal.  Given INR trending up, will give further reduced dose again tonight.  Goal of Therapy:  INR 2-3 Monitor platelets by anticoagulation protocol: Yes   Plan:  - Reduce warfarin to 2 mg PO tonight - Daily INR - Monitor for s/sx of bleeding, diet change, drug interactions   Thank you for allowing pharmacy to be a part of this patient's care.  Demetrius Charity, PharmD Acute Care Pharmacy Resident  Pager: [phone] 09/22/2016

## 2016-09-23 ENCOUNTER — Inpatient Hospital Stay (HOSPITAL_COMMUNITY): Payer: PPO

## 2016-09-23 LAB — CBC
HEMATOCRIT: 29.6 % — AB (ref 39.0–52.0)
Hemoglobin: 9.1 g/dL — ABNORMAL LOW (ref 13.0–17.0)
MCH: 25.8 pg — ABNORMAL LOW (ref 26.0–34.0)
MCHC: 30.7 g/dL (ref 30.0–36.0)
MCV: 83.9 fL (ref 78.0–100.0)
Platelets: 266 10*3/uL (ref 150–400)
RBC: 3.53 MIL/uL — AB (ref 4.22–5.81)
RDW: 15.5 % (ref 11.5–15.5)
WBC: 11.8 10*3/uL — AB (ref 4.0–10.5)

## 2016-09-23 LAB — PROTIME-INR
INR: 3.45
Prothrombin Time: 35.5 seconds — ABNORMAL HIGH (ref 11.4–15.2)

## 2016-09-23 LAB — GLUCOSE, CAPILLARY
GLUCOSE-CAPILLARY: 114 mg/dL — AB (ref 65–99)
GLUCOSE-CAPILLARY: 96 mg/dL (ref 65–99)
GLUCOSE-CAPILLARY: 155 mg/dL — AB (ref 65–99)
GLUCOSE-CAPILLARY: 155 mg/dL — AB (ref 65–99)

## 2016-09-23 MED ORDER — FUROSEMIDE 40 MG PO TABS
40.0000 mg | ORAL_TABLET | Freq: Two times a day (BID) | ORAL | Status: DC
  Administered 2016-09-23 – 2016-09-29 (×13): 40 mg via ORAL
  Filled 2016-09-23 (×14): qty 1

## 2016-09-23 MED ORDER — SODIUM BICARBONATE 650 MG PO TABS: 1300.0000 mg | ORAL_TABLET | Freq: Three times a day (TID) | ORAL | 0 refills | Status: DC

## 2016-09-23 MED ORDER — HYDROCOD POLST-CPM POLST ER 10-8 MG/5ML PO SUER
5.0000 mL | Freq: Once | ORAL | Status: AC
  Administered 2016-09-23: 5 mL via ORAL
  Filled 2016-09-23: qty 5

## 2016-09-23 MED ORDER — WARFARIN SODIUM 2.5 MG PO TABS: 2.5000 mg | ORAL_TABLET | Freq: Every day | ORAL | 0 refills | Status: DC

## 2016-09-23 MED ORDER — INSULIN GLARGINE 100 UNIT/ML SOLOSTAR PEN: 40.0000 [IU] | PEN_INJECTOR | Freq: Every morning | SUBCUTANEOUS | Status: DC

## 2016-09-23 MED ORDER — FUROSEMIDE 40 MG PO TABS
40.0000 mg | ORAL_TABLET | Freq: Two times a day (BID) | ORAL | 0 refills | Status: DC
Start: 2016-09-23 — End: 2017-11-03

## 2016-09-23 MED ORDER — CLONIDINE HCL 0.3 MG PO TABS: 0.3000 mg | ORAL_TABLET | Freq: Three times a day (TID) | ORAL | 0 refills | Status: DC

## 2016-09-23 MED ORDER — BENZONATATE 100 MG PO CAPS
100.0000 mg | ORAL_CAPSULE | Freq: Three times a day (TID) | ORAL | Status: DC
  Administered 2016-09-23 – 2016-09-29 (×19): 100 mg via ORAL
  Filled 2016-09-23 (×19): qty 1

## 2016-09-23 MED ORDER — INSULIN ASPART 100 UNIT/ML FLEXPEN: 7.5000 [IU] | PEN_INJECTOR | Freq: Three times a day (TID) | SUBCUTANEOUS | 6 refills | Status: DC

## 2016-09-23 MED ORDER — CALCIUM ACETATE (PHOS BINDER) 667 MG PO CAPS: 667.0000 mg | ORAL_CAPSULE | Freq: Three times a day (TID) | ORAL | 0 refills | Status: DC

## 2016-09-23 MED ORDER — DILTIAZEM HCL ER COATED BEADS 240 MG PO CP24: 240.0000 mg | ORAL_CAPSULE | Freq: Every day | ORAL | 0 refills | Status: DC

## 2016-09-23 NOTE — Care Management Note (Signed)
Case Management Note  Patient Details  Name: Gary Lang MRN: [id number] Date of Birth: 09/27/1950  Subjective/Objective:        CM following for progression and d/c planning.             Action/Plan: 09/23/2016 Noted plan to d/c this pt to home, [facility] notified re [facility] services. This CM informed of cancellation of d/c due to be becoming SOB with wheezing when OOB. Oxygen restarted and d/c cancelled.  Will continue to follow.   Expected Discharge Date:  09/23/16               Expected Discharge Plan:  [place]  In-House Referral:  NA  Discharge planning Services  CM Consult  Post Acute Care Choice:  Home Health Choice offered to:  Spouse  DME Arranged:  N/A DME Agency:  NA  HH Arranged:  PT, RN [place] Agency:  [facility]  Status of Service:  In process, will continue to follow  If discussed at Long Length of Stay Meetings, dates discussed:    Additional Comments:  Adron Bene, RN 09/23/2016, 3:36 PM

## 2016-09-23 NOTE — Progress Notes (Signed)
ANTICOAGULATION CONSULT NOTE - Follow Up Consult  Pharmacy Consult for warfarin Indication: atrial fibrillation  Patient Measurements: Height: 6' 2.5" (189.2 cm) Weight: 241 lb 12.8 oz (109.7 kg) IBW/kg (Calculated) : 83.35   Labs:  Recent Labs  09/21/16 0411 09/22/16 0418 09/22/16 1326 09/23/16 0631  HGB 9.3* 8.8*  --  9.1*  HCT 29.4* 27.9*  --  29.6*  PLT 235 231  --  266  LABPROT 28.4* 31.0*  --  35.5*  INR 2.60 2.91  --  3.45  CREATININE 6.30*  --  5.90*  --     Estimated Creatinine Clearance: 16.4 mL/min (by C-G formula based on SCr of 5.9 mg/dL (H)).   Assessment: 43 YOM with new onset Afib with RVR.  The patient is s/p heparin drip. Oral anticoagulation was held pending possible surgical plans for cholecystitis. There is no immediate surgery planned so pharmacy was consulted to add new warfarin starting 1/15.   Was nNoted to have hematuria and bleeding on the R-side of the neck where the old catheter access used to be on 1/18- no bleeding ntoed today as per discussion with Lanelle Bal, RN. Hemoglobin remains stable in 9's, platelets are normal.  INR trended up this morning to above goal at 3.45.   Goal of Therapy:  INR 2-3 Monitor platelets by anticoagulation protocol: Yes   Plan:  - No warfarin tonight  - Daily INR while in the hospital - Monitor for s/sx of bleeding, diet change, drug interactions  - If patient discharges today, recommend he takes warfarin 2.'5mg'$  po daily starting 1/23 with an INR check on Thurs 1/25  Thank you for allowing pharmacy to be a part of this patient's care.  Kenidy Crossland D. Takesha Steger, PharmD, BCPS Clinical Pharmacist Pager: [phone] 09/23/2016 10:14 AM

## 2016-09-23 NOTE — Progress Notes (Signed)
PT Cancellation Note  Patient Details Name: Gary Lang MRN: [id number] DOB: 10-15-1950   Cancelled Treatment:    Reason Eval/Treat Not Completed:  (Leaving for home shortly per pt).  Will try tomorrow if DC is held for some reason.   Ramond Dial 09/23/2016, 11:29 AM   Mee Hives, PT MS Acute Rehab Dept. Number: [place] and [place]

## 2016-09-23 NOTE — Progress Notes (Signed)
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 Transiently on CRRT for acute dyspnea from pulmonary edema/worsening renal function and A. flutter with RVR.  Creatinine plateaued, is starting to downtrend and is 5.9. Has a left radiocephalic fistula (mature) suitable for use. Continue Lasix 40 PO BID.  Will need to arrange close followup with Dr. Florene Glen 2. Right lower lobe pneumonia/acute cholecystitis:  S/p broad-spectrum antibiotics and s/p percutaneous cholecystostomy-- will need cholecystectomy as an outpatient.   3. Hyponatremia, resolved.  4.  Volume overload: improving; continue fluid restriction and diuretics. 4. Hypertension: hypertensive agents-continue to monitor. 5. Hyperphosphatemia: resumed phoslo 1 tab TID with meals 6. Atrial flutter with rapid ventricular response:now sinus rhythm, s/p  heparin drip & now on warfarin--INR therapeutic. On Cardizem 240 daily 7.  Metabolic acidosis: on sodium bicarb 1300 TID  Subjective:  Feeling better this AM after the extra dose of Lasix yesterday.  Still very eager to leave the hospital.  Renal function getting better.  Objective: Vital signs in last 24 hours: Temp:  [98.2 F (36.8 C)-98.4 F (36.9 C)] 98.4 F (36.9 C) (01/22 0957) Pulse Rate:  [74-88] 88 (01/22 0957) Resp:  [18-21] 18 (01/22 0957) BP: (124-188)/(55-64) 188/56 (01/22 0957) SpO2:  [90 %-94 %] 93 % (01/22 0957) Weight change:   Intake/Output from previous day: 01/21 0701 - 01/22 0700 In: 600 [P.O.:600] Out: 1000 [Urine:800; Drains:200] Intake/Output this shift: Total I/O In: 120 [P.O.:120] Out: 350 [Urine:350]  GEN: NAD, off O2 today HEENT: EOMI, PERRL NECK: no overt JVD PULM: expiratory wheezes improved, faint RLL expiratory squeak CV RRR ABD soft, nondistended, + perc chole tube in place with brown fluid EXT trace LE edema NEURO AAO x 3 ACCESS L FA AVF with + thrill and  bruit  Lab Results:  Recent Labs  09/22/16 0418 09/23/16 0631  WBC 8.2 11.8*  HGB 8.8* 9.1*  HCT 27.9* 29.6*  PLT 231 266   BMET:   Recent Labs  09/21/16 0411 09/22/16 1326  NA 140 135  K 4.4 5.1  CL 104 102  CO2 26 22  GLUCOSE 71 326*  BUN 87* 86*  CREATININE 6.30* 5.90*  CALCIUM 8.1* 8.1*   No results for input(s): PTH in the last 72 hours. Iron Studies: No results for input(s): IRON, TIBC, TRANSFERRIN, FERRITIN in the last 72 hours. Studies/Results: No results found.  Scheduled: . calcium acetate  667 mg Oral TID WC  . cholecalciferol  1,000 Units Oral Daily  . cloNIDine  0.3 mg Oral TID  . diltiazem  240 mg Oral Daily  . feeding supplement (NEPRO CARB STEADY)  237 mL Oral BID BM  . furosemide  40 mg Oral BID  . insulin aspart  0-9 Units Subcutaneous TID AC & HS  . insulin aspart  5 Units Subcutaneous TID WC  . insulin glargine  50 Units Subcutaneous QHS  . pantoprazole  40 mg Oral Daily  . saccharomyces boulardii  250 mg Oral BID  . senna-docusate  1 tablet Oral QHS  . sodium bicarbonate  1,300 mg Oral TID  . Warfarin - Pharmacist Dosing Inpatient   Does not apply q1800   Continuous:    LOS: 18 days   Gary Lang 09/23/2016,10:44 AM

## 2016-09-23 NOTE — Discharge Summary (Signed)
Physician Discharge Summary  Gary Lang LPF:[id number] DOB: Aug 02, 1951 DOA: 09/05/2016  PCP: [name], MD  Admit date: 09/05/2016 Discharge date: 09/23/2016  Time spent: > 35 minutes  Recommendations for Outpatient Follow-up:  1. F/u with surgeon within the next 1-2 weeks after discharge to address gall bladder problems 2. Monitor INR levels and adjust coumadin levels 3. Reassess INR levels by 09/26/16 4. Recommend patient f/u with cardiology   Discharge Diagnoses:  Active Problems:   Community acquired pneumonia of right lower lobe of lung ([place])   Acute respiratory failure with hypoxia ([place])   Acute cholecystitis due to biliary calculus   AKI (acute kidney injury) ([place])   Abdominal distention, non-gaseous   Acute abdominal pain   Fever   PAF (paroxysmal atrial fibrillation) ([place])   Discharge Condition: stable  Diet recommendation: renal/diabetic  Filed Weights   09/19/16 2037 09/20/16 2030 09/21/16 2000  Weight: 111.6 kg (246 lb) 111.6 kg (246 lb 0.5 oz) 109.7 kg (241 lb 12.8 oz)    History of present illness:  66 y.o.malewith PMH as outlined below including biopsy proven variant of FSGS with subsequent CKD III. He was admitted 09/05/16 with E.Coli bacteremia due to acute cholecystitis. He was seen by [facility] and had percutaneous drain placed 01/09 as [facility] felt he was not stable enough for full surgery. Since admission, he has had worsening in renal function and also developed new onset paroxysmal atrial fib. On 1/10 Due to worsening renal function started on CVVHD (prefered over HD due to HR).  Hospital Course:   #Sepsis due to E Coli bacteremia and possibly commonly acquired pneumonia/acute cholecystitis:Chest x-ray showed right lower lobe pneumonia. Patient with tachycardia, leukocytosis, fever and abnormal x-ray finding on admission -2 out of 2 blood cultures + Escherichia coli. Drain culture with  ESCHERICHIA COLI and KLEBSIELLA PNEUMONIAE --S/p perc cholecystostomy  09/08/16.per IR needsOP follow-up in IR clinic in 5-6 weeks -general surgery oked to start coumadin, patient is to follow up with general surgery in a few weeks for elective laparoscopic cholecystectomy  - sepsis resolving, taper off steroids, finished 10 days of iv abx, d/c abx on 1/14, repeat blood culture no growth  #Acute hypoxic respiratory failure likely in the setting of pneumonia and sepsis: - Finished abx treatment on 1/14, Improved, on room air. Encourage incentive spirometry - had some wheezes which improved with solumedrol. Renal to administer extra lasix dose today.  Blood loss anemia - resolved with discontinuation of heparin and aspirin. Currently patient on coumadin. With no blood loss reported  #Acute on chronic kidney disease, history of FSGN , has AVF in place,   - Nephrology to continue to manage.  #Elevated troponin likely in the setting of sepsis/aki:Trend cardiac enzymes in the setting of new onset atrial fibrillation -Echocardiogram showed vigorous left ventricular systolic function with EF of 65-30%. There were no regional wall motion abnormalities. Also with grade 1 diastolic dysfunction. Converted to sinus rhythm, currently on ccb Both general surgery and cardiology oked with transition from heparin drip to  Coumadin on 1/15 Cardiology signed off on 1/15, outpatient follow up recommended he is new on coumadin, coumadin teaching, patient needs to have outpatient coumadin level checked. - Continue coumadin at 2.5 mg daily. Will need to recheck INR by 09/26/16  Uncontrolled diabetes, insulin dependent Required insulin drip initially Continue lantus and short acting insulin regimen on d/c a1c 9.4  HTN; on ccb. clonidine  Physical deconditioning Prior to hospitalization, he is independent with ADLs. PT eval, will need  home health, drain care/PT   Procedures: 1/04 > admit sepsis > e.coli bacteremia secondary to acute cholecystitis 1/07>Percutaneous  drain placed by IR 1/10 > cvvhd, fib rvr  Consultations:  General surgeon  Nephrology  Discharge Exam: Vitals:   09/23/16 0429 09/23/16 0957  BP: (!) 147/58 (!) 188/56  Pulse: 83 88  Resp: (!) 21 18  Temp: 98.2 F (36.8 C) 98.4 F (36.9 C)    General: Pt in nad, alert and awake Cardiovascular: irrr, no rubs  Respiratory: cta bl, no wheezes  Discharge Instructions   Discharge Instructions    Call MD for:  redness, tenderness, or signs of infection (pain, swelling, redness, odor or green/yellow discharge around incision site)    Complete by:  As directed    Diet - low sodium heart healthy    Complete by:  As directed    Increase activity slowly    Complete by:  As directed      Current Discharge Medication List    START taking these medications   Details  calcium acetate (PHOSLO) 667 MG capsule Take 1 capsule (667 mg total) by mouth 3 (three) times daily with meals. Qty: 90 capsule, Refills: 0    diltiazem (CARDIZEM CD) 240 MG 24 hr capsule Take 1 capsule (240 mg total) by mouth daily. Qty: 30 capsule, Refills: 0    sodium bicarbonate 650 MG tablet Take 2 tablets (1,300 mg total) by mouth 3 (three) times daily. Qty: 60 tablet, Refills: 0    warfarin (COUMADIN) 2.5 MG tablet Take 1 tablet (2.5 mg total) by mouth daily. Qty: 30 tablet, Refills: 0      CONTINUE these medications which have CHANGED   Details  cloNIDine (CATAPRES) 0.3 MG tablet Take 1 tablet (0.3 mg total) by mouth 3 (three) times daily. Qty: 90 tablet, Refills: 0    furosemide (LASIX) 40 MG tablet Take 1 tablet (40 mg total) by mouth 2 (two) times daily. Qty: 30 tablet, Refills: 0    insulin aspart (NOVOLOG FLEXPEN) 100 UNIT/ML FlexPen Inject 7.5 Units into the skin 3 (three) times daily with meals. Qty: 5 pen, Refills: 6   Associated Diagnoses: Diabetes mellitus (HCC)    Insulin Glargine (LANTUS) 100 UNIT/ML Solostar Pen Inject 40 Units into the skin every morning. Increase 5 u if am CBG is  greater than 200.  Decrease 5 u if am CBG is less than 150   Associated Diagnoses: Diabetes mellitus type II, controlled ([place])      CONTINUE these medications which have NOT CHANGED   Details  acetaminophen (TYLENOL) 500 MG tablet Take 2 tablets (1,000 mg total) by mouth 3 (three) times daily as needed for pain.   Associated Diagnoses: Rheumatoid arthritis(714.0)    atorvastatin (LIPITOR) 40 MG tablet Take 1 tablet by mouth daily.    cholecalciferol (VITAMIN D) 1000 units tablet Take 1,000 Units by mouth daily.    dicyclomine (BENTYL) 20 MG tablet Take 1 tablet (20 mg total) by mouth 2 (two) times daily as needed for spasms. Qty: 20 tablet, Refills: 0    Omega-3 Fatty Acids (FISH OIL PO) Take 1 capsule by mouth daily.    ondansetron (ZOFRAN ODT) 4 MG disintegrating tablet Take 1 tablet (4 mg total) by mouth every 8 (eight) hours as needed for nausea or vomiting. Qty: 20 tablet, Refills: 0    TRULICITY 1.5 PZ/0.2HE SOPN Inject 1 Units as directed once a week. Patient isn't sure how many units he takes. Patient takes on Wednesday's.    !!  Insulin Pen Needle (PEN NEEDLES) 31G X 6 MM MISC Use 1 needle per injection as directed Qty: 100 each, Refills: 1    !! UNIFINE PENTIPS 31G X 5 MM MISC USE AS DIRECTED WITH INSULIN Qty: 100 each, Refills: PRN     !! - Potential duplicate medications found. Please discuss with provider.    STOP taking these medications     aspirin 81 MG EC tablet      diltiazem (DILACOR XR) 180 MG 24 hr capsule      loperamide (IMODIUM) 2 MG capsule      sildenafil (VIAGRA) 50 MG tablet        Allergies  Allergen Reactions  . Glyburide Other (See Comments)    unknown  . Methotrexate Derivatives Other (See Comments)    Patient cannot remember   Follow-up Information    [name], MD Follow up.   Specialty:  Interventional Radiology Why:  pt will hear from OP IR drain clinic scheduler for recheck time and date---call [phone] if questions or  concerns Contact information: [address] [place] [address] [phone]        Maia Petties., MD. Call.   Specialty:  General Surgery Why:  3-4 weeks Contact information: [address] [place] [address] [phone]        Minus Breeding, MD Follow up.   Specialty:  Cardiology Why:  afib Contact information: [address] [place] [address] [phone]            The results of significant diagnostics from this hospitalization (including imaging, microbiology, ancillary and laboratory) are listed below for reference.    Significant Diagnostic Studies: Ct Abdomen Pelvis Wo Contrast  Result Date: 09/11/2016 CLINICAL DATA:  Left lower quadrant abdominal pain. EXAM: CT ABDOMEN AND PELVIS WITHOUT CONTRAST TECHNIQUE: Multidetector CT imaging of the abdomen and pelvis was performed following the standard protocol without IV contrast. COMPARISON:  09/07/2016 FINDINGS: Lower chest: Moderate right pleural effusion, increased since prior study. Consolidation in the right lower lobe. Left base atelectasis. Heart is normal size. Hepatobiliary: Interval placement of a cholecystostomy drainage catheter into the gallbladder with decompression of the gallbladder. The subcapsular fluid collection along the inferior right hepatic surface has decreased in size since prior study. Pancreas: No focal abnormality or ductal dilatation. Spleen: No focal abnormality.  Normal size. Adrenals/Urinary Tract: Perinephric stranding around the kidneys bilaterally, stable. Left renal cyst is also stable. No hydronephrosis. Adrenal glands are unremarkable. Foley catheter is present in the bladder which is decompressed. Stomach/Bowel: Colonic diverticulosis. No active diverticulitis. Stomach and small bowel are decompressed, unremarkable. Vascular/Lymphatic: No evidence of aneurysm or adenopathy. Reproductive: No visible focal abnormality. Other: Small amount  of free fluid in the pelvis and adjacent to the liver. No free air. Large left inguinal hernia containing fat, stable. Musculoskeletal: No acute bony abnormality or focal bone lesion. IMPRESSION: Interval placement of cholecystostomy drainage catheter into the gallbladder with decompression. The subcapsular fluid collection along the right hepatic margin has decreased in size as well. Small amount of free fluid in the pelvis and adjacent to the liver. Increasing moderate right pleural effusion with right low consolidation. Cannot exclude pneumonia. Left base atelectasis. Left inguinal hernia containing fat. Electronically Signed   By: Rolm Baptise M.D.   On: 09/11/2016 12:20   Ct Abdomen Pelvis Wo Contrast  Result Date: 09/07/2016 CLINICAL DATA:  66 year old male with history of sepsis due to E coli bacteremia. Pneumonia. Acute  on chronic kidney disease. EXAM: CT ABDOMEN AND PELVIS WITHOUT CONTRAST TECHNIQUE: Multidetector CT imaging of the abdomen and pelvis was performed following the standard protocol without IV contrast. COMPARISON:  CT the abdomen and pelvis 09/03/2016. FINDINGS: Lower chest: Multiple new areas of consolidation and volume loss throughout the lung bases bilaterally, most severe in the lower lobes (right greater than left), concerning for sequela of recent aspiration. Trace bilateral pleural effusions. Hepatobiliary: New compared to the prior examination is a subhepatic fluid collection and beneath the right lobe of the liver which is likely to be subcapsular. This is best appreciated on sagittal image 31 of series 6 and coronal image 56 of series 5 where it measures 10.6 x 3.8 x 8.6 cm. No other definite intrahepatic lesions are identified on today's noncontrast CT examination. Small focus of intermediate to high attenuation lying dependently in the gallbladder likely represents a noncalcified gallstone. Small amount of pericholecystic fluid. Haziness and inflammatory changes in the soft  tissues surrounding the gallbladder. Gallbladder appears moderately distended. Pancreas: No definite pancreatic mass or peripancreatic inflammatory changes are noted on today's noncontrast CT examination. Spleen: Unremarkable. Adrenals/Urinary Tract: 4.5 cm low-attenuation lesion in the lateral aspect of the interpolar region of the left kidney is incompletely characterized, but previously characterized as a simple cyst on prior study 05/21/2011. Right kidney and bilateral adrenal glands are normal in appearance. No hydroureteronephrosis. Urinary bladder is nearly completely decompressed around an indwelling Foley balloon catheter. Small amount of gas non dependently in the lumen of the urinary bladder is iatrogenic. Stomach/Bowel: Unenhanced appearance of the stomach is normal. There is no pathologic dilatation of small bowel or colon. Small duodenal diverticulum extending off the medial aspect of the second portion of the duodenum incidentally noted. No surrounding inflammatory changes to suggest an associated diverticulitis at this time. There also a few scattered colonic diverticulae, also without surrounding inflammatory changes to suggest an acute diverticulitis at this time. Normal appendix. Vascular/Lymphatic: Atherosclerotic calcifications in the abdominal aorta (mild), without evidence of aneurysm. No lymphadenopathy noted in the abdomen or pelvis on today's noncontrast CT examination. Reproductive: Prostate gland seminal vesicles are unremarkable in appearance. Other: Moderate sized left inguinal hernia containing only fat. No significant volume of ascites. No pneumoperitoneum. Musculoskeletal: There are no aggressive appearing lytic or blastic lesions noted in the visualized portions of the skeleton. IMPRESSION: 1. Interval development of extensive pericholecystic fluid. There is a small noncalcified gallstone or sludge ball near the neck of the gallbladder. Notably, there are also inflammatory changes  around the gallbladder and the pericholecystic fluid appears contiguous with a subhepatic fluid collection beneath the right lobe of the liver, which appears to be likely subcapsular in location. Clinical correlation for signs and symptoms of acute cholecystitis is strongly recommended. 2. The appearance of the lower thorax suggests sequela of recent aspiration with multifocal aspiration pneumonitis/pneumonia. 3. Aortic atherosclerosis. 4. Additional incidental findings, as above. These results will be called to the ordering clinician or representative by the Radiologist Assistant, and communication documented in the PACS or zVision Dashboard. Electronically Signed   By: Vinnie Langton M.D.   On: 09/07/2016 16:52   Ct Abdomen Pelvis Wo Contrast  Result Date: 09/03/2016 CLINICAL DATA:  Severe abdominal pain for 8 hours, cramping. Vomiting. History of hypertension, diabetes, cholecystectomy and appendectomy. EXAM: CT ABDOMEN AND PELVIS WITHOUT CONTRAST TECHNIQUE: Multidetector CT imaging of the abdomen and pelvis was performed following the standard protocol without IV contrast. Oral contrast administered. COMPARISON:  Abdominal radiographs September 02, 2016  and CT abdomen and pelvis May 21, 2011 FINDINGS: LOWER CHEST: Dependent atelectasis. The visualized heart size is normal. No pericardial effusion. Contrast in the distal esophagus may be retained or refluxed. HEPATOBILIARY: Normal. PANCREAS: Normal. SPLEEN: Normal. ADRENALS/URINARY TRACT: Kidneys are orthotopic, demonstrating normal size and morphology. No nephrolithiasis, hydronephrosis; limited assessment for renal masses on this nonenhanced examination. 4.2 cm exophytic LEFT upper pole renal cyst. The unopacified ureters are normal in course and caliber. Urinary bladder is partially distended and unremarkable. Normal adrenal glands. STOMACH/BOWEL: The stomach, small and large bowel are normal in course and caliber without inflammatory changes. Proximal  duodenum diverticulum. Moderate colonic diverticulosis. Scattered small bowel diverticula noted in the RIGHT abdomen. Normal appendix. VASCULAR/LYMPHATIC: Aortoiliac vessels are normal in course and caliber, trace calcific atherosclerosis. No lymphadenopathy by CT size criteria. REPRODUCTIVE: Mild prostatomegaly. OTHER: No intraperitoneal free fluid or free air. MUSCULOSKELETAL: Non-acute. Large fat containing LEFT inguinal hernia. Status post RIGHT inguinal herniorrhaphy. Small fat containing umbilical hernia. Degenerative change of lumbar spine resulting in moderate L5-S1 neural foraminal narrowing. IMPRESSION: No acute intra-abdominal or pelvic process. Small large bowel diverticulosis. Electronically Signed   By: Elon Alas M.D.   On: 09/03/2016 02:29   Dg Chest 2 View  Result Date: 09/05/2016 CLINICAL DATA:  66 year old male with right upper quadrant pain, shortness of breath and generalized weakness for the past 4 days EXAM: CHEST  2 VIEW COMPARISON:  Prior chest x-ray 10/12/2013 FINDINGS: Interval removal of right IJ approach tunneled hemodialysis catheter. New patchy right lower lobe airspace opacity predominantly in the lower lobe. Additionally, there is atelectasis in the right middle lobe. Suspect a small layering pleural effusion as well. Stable borderline cardiomegaly. Probable trace chronic left pleural effusion. Mild vascular congestion without overt edema. No acute osseous abnormality. IMPRESSION: 1. New patchy airspace opacity in the right lower lobe concerning for pneumonia. Followup PA and lateral chest X-ray is recommended in 3-4 weeks following trial of antibiotic therapy to ensure resolution and exclude underlying malignancy. 2. Right middle lobe atelectasis. 3. Small right pleural effusion is likely parapneumonic. 4. Stable cardiomegaly and mild vascular congestion without overt edema. 5. Similar appearance of chronic small left pleural effusion. Electronically Signed   By: Jacqulynn Cadet M.D.   On: 09/05/2016 08:27   Dg Abdomen 1 View  Result Date: 09/02/2016 CLINICAL DATA:  Nausea and vomiting and generalized abdominal pain and cramping starting this evening. EXAM: ABDOMEN - 1 VIEW COMPARISON:  CT from 05/21/2011 FINDINGS: Moderate colonic stool burden within large bowel. No bowel obstruction or free air. No organomegaly. Phleboliths are seen in the pelvis. L4-5 and L5-S1 facet arthropathy. IMPRESSION: Moderate colonic stool burden.  No bowel obstruction. Electronically Signed   By: Ashley Royalty M.D.   On: 09/02/2016 23:47   Ct Chest Wo Contrast  Result Date: 09/07/2016 CLINICAL DATA:  66 year old male with hypoxia, fever and right-sided chest pain. Evaluate for pneumonia. EXAM: CT CHEST WITHOUT CONTRAST TECHNIQUE: Multidetector CT imaging of the chest was performed following the standard protocol without IV contrast. COMPARISON:  Chest x-ray obtained earlier today ; prior CT scan of the abdomen and pelvis 09/03/2016 FINDINGS: Cardiovascular: Limited evaluation in the absence of intravenous contrast. Conventional 3 vessel arch anatomy. No evidence of aneurysm. The heart is normal in size. No pericardial effusion. The main pulmonary artery is borderline enlarged at 3.2 cm. Mediastinum/Nodes: Unremarkable CT appearance of the thyroid gland. No suspicious mediastinal or hilar adenopathy. No soft tissue mediastinal mass. The thoracic esophagus is unremarkable. Lungs/Pleura:  Multifocal atelectasis involving the right middle and bilateral lower lobes. The degree of opacification of the right lower lobe is slightly greater than expected for atelectasis and a small amount of superimposed consolidation is difficult to exclude entirely. There is respiratory motion artifact which slightly limits evaluation. Trace left-sided pleural effusion. Upper Abdomen: Incompletely imaged gallbladder distension. Musculoskeletal: No acute fracture or aggressive appearing lytic or blastic osseous lesion.  IMPRESSION: 1. Pulmonary findings are most consistent with multifocal atelectasis involving the right middle, right lower and left lower lobes. The degree of opacity in the right lower lobe is slightly greater than expected for the amount of volume loss and therefore a superimposed infiltrate/pneumonia is difficult to exclude radiographically. 2. Trace left pleural effusion. 3. Borderline enlarged main pulmonary artery. Query clinical history of pulmonary arterial hypertension? 4. Incompletely imaged gallbladder appears distended. Does the patient have right upper quadrant pain? If there is clinical concern for cholecystitis, consider right upper quadrant ultrasound. Electronically Signed   By: Jacqulynn Cadet M.D.   On: 09/07/2016 14:10   Ir Perc Cholecystostomy  Result Date: 09/08/2016 INDICATION: ACUTE CHOLECYSTITIS, NON OPERATIVE CANDIDATE EXAM: CHOLECYSTOSTOMY MEDICATIONS: 3.375 G ZOSYN; The antibiotic was administered within an appropriate time frame prior to the initiation of the procedure. ANESTHESIA/SEDATION: Moderate (conscious) sedation was employed during this procedure. A total of Versed 1.0 mg and Fentanyl 25 mcg was administered intravenously. Moderate Sedation Time: 10 minutes. The patient's level of consciousness and vital signs were monitored continuously by radiology nursing throughout the procedure under my direct supervision. FLUOROSCOPY TIME:  Fluoroscopy Time:  48 seconds (10 mGy). COMPLICATIONS: None immediate. PROCEDURE: Informed written consent was obtained from the patient after a thorough discussion of the procedural risks, benefits and alternatives. All questions were addressed. Maximal Sterile Barrier Technique was utilized including caps, mask, sterile gowns, sterile gloves, sterile drape, hand hygiene and skin antiseptic. A timeout was performed prior to the initiation of the procedure. Previous imaging reviewed. Preliminary ultrasound performed. The abnormal gallbladder was  localized in the right upper quadrant through a lower intercostal space. Overlying skin marked. Under sterile conditions and local anesthesia, percutaneous transhepatic needle access performed of the gallbladder under direct ultrasound. Needle position confirmed with ultrasound. There was return of bile. Guidewire inserted followed by the Accustick dilator set. Amplatz guidewire exchange performed. Tract dilatation performed to insert a 10 [place] drain. Drain catheter position confirmed with ultrasound and fluoroscopy. There was return of exudative bile. Sample sent for Gram stain and culture. Catheter secured with a Prolene suture and connected to external gravity drainage. Sterile dressing applied. No immediate complication. IMPRESSION: Successful ultrasound and fluoroscopic 10 French transhepatic cholecystostomy. Electronically Signed   By: Jerilynn Mages.  Shick M.D.   On: 09/08/2016 12:14   Dg Chest Port 1 View  Result Date: 09/14/2016 CLINICAL DATA:  Followup pleural effusion. EXAM: PORTABLE CHEST 1 VIEW COMPARISON:  09/13/2016.  Chest CT dated 09/07/2016. FINDINGS: The right jugular catheter is unchanged. Elevation of the right hemidiaphragm and right pleural fluid are unchanged. Mild left lower lobe atelectasis. Stable lower thoracic spine degenerative changes. IMPRESSION: 1. Stable elevated right hemidiaphragm and right pleural fluid. 2. Mild left lower lobe atelectasis. Electronically Signed   By: Claudie Revering M.D.   On: 09/14/2016 08:46   Dg Chest Port 1 View  Result Date: 09/13/2016 CLINICAL DATA:  Follow-up pleural effusion. EXAM: PORTABLE CHEST 1 VIEW COMPARISON:  09/12/2016 FINDINGS: Right internal jugular central line tip is in the SVC at the azygos level. The left lung remains clear  except for mild left base atelectasis. Right lower and middle lobe collapse persists. Probable subpulmonic effusion on the right. IMPRESSION: Only mild residual left base atelectasis. Persistent collapse of the right lower  and middle lobes, probably with right subpulmonic effusion. Electronically Signed   By: Nelson Chimes M.D.   On: 09/13/2016 07:29   Dg Chest Port 1 View  Result Date: 09/12/2016 CLINICAL DATA:  Respiratory failure. EXAM: PORTABLE CHEST 1 VIEW COMPARISON:  09/11/2016 . FINDINGS: Right IJ line in stable position. Stable cardiomegaly. Improved pulmonary interstitial edema. Persistent right base atelectasis. Improved left mid lung field subsegmental atelectasis. Small right pleural effusion . No pneumothorax. IMPRESSION: 1. Right IJ line stable position. 2. Stable mild cardiomegaly. Improvement of pulmonary interstitial edema. Small right pleural effusion. 3. Persistent right base atelectasis. Improvement of left mid lung field atelectasis . Electronically Signed   By: Marcello Moores  Register   On: 09/12/2016 07:10   Dg Chest Port 1 View  Result Date: 09/11/2016 CLINICAL DATA:  Hemodialysis catheter insertion, assess tip position ; patient's electronic health record is not available at time of this interpretation EXAM: PORTABLE CHEST 1 VIEW COMPARISON:  Portable exam 1534 hours compared 09/11/2016 FINDINGS: Tip of RIGHT jugular central venous catheter projects over SVC at the level of the carina. Upper normal heart size. Slight pulmonary vascular congestion. Mediastinal contours normal. Improved pulmonary edema with persistent atelectasis versus consolidation at RIGHT lung base. Associated RIGHT pleural effusion. No pneumothorax following central line insertion. IMPRESSION: No pneumothorax following central line placement. Improved pulmonary edema with persistent atelectasis versus consolidation and pleural effusion at RIGHT lung base. Electronically Signed   By: Lavonia Dana M.D.   On: 09/11/2016 15:46   Dg Chest Port 1 View  Result Date: 09/11/2016 CLINICAL DATA:  Onset epigastric pain today.  Wheezing. EXAM: PORTABLE CHEST 1 VIEW COMPARISON:  09/09/2016 FINDINGS: Diffuse bilateral airspace disease, right greater  than left, most confluent in the right lower lung. While this could reflect edema, cannot exclude pneumonia. Heart is borderline in size. Probable right effusion. IMPRESSION: Bilateral airspace disease, right greater than left, most confluent in the right base. Edema versus infection. Electronically Signed   By: Rolm Baptise M.D.   On: 09/11/2016 11:13   Dg Chest Port 1 View  Result Date: 09/09/2016 CLINICAL DATA:  Shortness of breath, wheezing. EXAM: PORTABLE CHEST 1 VIEW COMPARISON:  Radiograph August 07, 2017. FINDINGS: Stable cardiomediastinal silhouette. No pneumothorax is noted. Stable left midlung atelectasis is noted. Stable right basilar opacity is noted concerning for atelectasis or infiltrate. Bony thorax is unremarkable. IMPRESSION: Stable left midlung and right basilar opacities are noted concerning for atelectasis or possibly infiltrate. Electronically Signed   By: Marijo Conception, M.D.   On: 09/09/2016 09:50   Dg Chest Port 1 View  Result Date: 09/07/2016 CLINICAL DATA:  Hypoxia EXAM: PORTABLE CHEST 1 VIEW COMPARISON:  Chest radiograph and chest CT September 07, 2016 FINDINGS: There is persistent patchy airspace consolidation in the left mid lung and right base regions. There is no evident new opacity compared to earlier in the day. Heart is mildly enlarged with pulmonary vascularity within normal limits. There is atherosclerotic calcification in the aorta. There is stable eventration of the right hemidiaphragm. No adenopathy. No bone lesions. IMPRESSION: Areas of airspace consolidation in the left mid lung and right base regions. No new opacity. Stable cardiac silhouette. There is aortic atherosclerosis. Electronically Signed   By: Lowella Grip III M.D.   On: 09/07/2016 21:45   Dg  Chest Port 1 View  Result Date: 09/07/2016 CLINICAL DATA:  Fever, hypoxia.  Subsequent encounter. EXAM: PORTABLE CHEST 1 VIEW COMPARISON:  09/05/2016 FINDINGS: There is opacity at the right lung base which is  similar to the prior exam allowing for differences in patient positioning and technique. Mild linear scarring or atelectasis in the left mid lung, stable. Remainder of the lungs is clear. Elevation right hemidiaphragm is stable. No obvious pleural effusion. No pneumothorax. IMPRESSION: 1. Right base opacity is similar to the prior exam. Although this may be atelectasis only, pneumonia should be considered likely given the symptoms of fever and hypoxia. 2. No significant change from the prior exam allowing for differences in positioning and technique. Electronically Signed   By: Lajean Manes M.D.   On: 09/07/2016 09:18   Dg Abd 2 Views  Result Date: 09/05/2016 CLINICAL DATA:  Abdominal distention EXAM: ABDOMEN - 2 VIEW COMPARISON:  CT abdomen and pelvis September 03, 2016 FINDINGS: Supine and upright images obtained. There is contrast in the colon. There is no bowel dilatation or air-fluid level suggesting bowel obstruction. No free air. There are phleboliths in the pelvis. IMPRESSION: Contrast in colon.  No bowel obstruction or free air evident. Electronically Signed   By: Lowella Grip III M.D.   On: 09/05/2016 11:47   Dg Abd Portable 1v  Result Date: 09/10/2016 CLINICAL DATA:  Abdominal pain.  Cholecystostomy. EXAM: PORTABLE ABDOMEN - 1 VIEW COMPARISON:  CT 09/07/2016 FINDINGS: Pigtail catheter right upper quadrant in satisfactory position most likely within the gallbladder. Normal bowel gas pattern. No obstruction or ileus. Small amount of contrast or ingested tablets in the stomach. IMPRESSION: Cholecystostomy tube in good position.  Normal bowel gas pattern. Electronically Signed   By: Franchot Gallo M.D.   On: 09/10/2016 15:38    Microbiology: No results found for this or any previous visit (from the past 240 hour(s)).   Labs: Basic Metabolic Panel:  Recent Labs Lab 09/18/16 0651 09/19/16 0647 09/20/16 0746 09/21/16 0411 09/22/16 0418 09/22/16 1326  NA 135 136 139 140  --  135  K 4.1  3.9 4.3 4.4  --  5.1  CL 99* 100* 103 104  --  102  CO2 '23 22 24 26  '$ --  22  GLUCOSE 92 59* 73 71  --  326*  BUN 108* 105* 97* 87*  --  86*  CREATININE 6.10* 6.22* 6.18* 6.30*  --  5.90*  CALCIUM 8.1* 7.9* 7.9* 8.1*  --  8.1*  MG 2.6* 2.4 2.4 2.3 2.5*  --   PHOS 8.2* 7.3* 6.3* 6.4*  --  6.2*   Liver Function Tests:  Recent Labs Lab 09/18/16 0651 09/19/16 0647 09/20/16 0746 09/21/16 0411 09/22/16 1326  ALBUMIN 1.7* 1.7* 1.6* 1.6* 1.7*   No results for input(s): LIPASE, AMYLASE in the last 168 hours. No results for input(s): AMMONIA in the last 168 hours. CBC:  Recent Labs Lab 09/19/16 0647 09/19/16 0946 09/20/16 0746 09/21/16 0411 09/22/16 0418 09/23/16 0631  WBC 16.6*  --  13.7* 10.8* 8.2 11.8*  HGB 9.5* 9.9* 9.2* 9.3* 8.8* 9.1*  HCT 29.6* 30.6* 28.3* 29.4* 27.9* 29.6*  MCV 82.0  --  81.6 83.1 84.0 83.9  PLT 237  --  265 235 231 266   Cardiac Enzymes: No results for input(s): CKTOTAL, CKMB, CKMBINDEX, TROPONINI in the last 168 hours. BNP: BNP (last 3 results)  Recent Labs  09/07/16 0719  BNP 101.0*    ProBNP (last 3 results) No results  for input(s): PROBNP in the last 8760 hours.  CBG:  Recent Labs Lab 09/22/16 1223 09/22/16 1750 09/22/16 2135 09/23/16 0749 09/23/16 1234  GLUCAP 280* 285* 331* 114* 96       Signed:  Velvet Bathe MD.  [facility] 09/23/2016, 2:13 PM

## 2016-09-23 NOTE — Progress Notes (Addendum)
Wife at the bedside stated she helped pt to [facility]. Pt became SOB and she proceeded to put O2 at liters on pt.   Upon assessment pt has bilateral wheezing in upper lung lobes.   O2 saturation 93% initially.   Administered Nebulizer treatment and Tessalon Po.   Took O2 off the pt and O2 saturation is 91%. Placed O2 at 2 liters and pt at 95% O2 saturation.   Dr. Wendee Beavers has been notified.    Will continue to monitor    Paulla Fore, RN

## 2016-09-24 ENCOUNTER — Inpatient Hospital Stay (HOSPITAL_COMMUNITY): Payer: PPO

## 2016-09-24 DIAGNOSIS — J90 Pleural effusion, not elsewhere classified: Secondary | ICD-10-CM

## 2016-09-24 DIAGNOSIS — R140 Abdominal distension (gaseous): Secondary | ICD-10-CM

## 2016-09-24 LAB — CBC
HCT: 30.4 % — ABNORMAL LOW (ref 39.0–52.0)
Hemoglobin: 9.4 g/dL — ABNORMAL LOW (ref 13.0–17.0)
MCH: 26.1 pg (ref 26.0–34.0)
MCHC: 30.9 g/dL (ref 30.0–36.0)
MCV: 84.4 fL (ref 78.0–100.0)
PLATELETS: 254 10*3/uL (ref 150–400)
RBC: 3.6 MIL/uL — AB (ref 4.22–5.81)
RDW: 15.8 % — AB (ref 11.5–15.5)
WBC: 11.9 10*3/uL — ABNORMAL HIGH (ref 4.0–10.5)

## 2016-09-24 LAB — GLUCOSE, CAPILLARY
GLUCOSE-CAPILLARY: 58 mg/dL — AB (ref 65–99)
GLUCOSE-CAPILLARY: 92 mg/dL (ref 65–99)
Glucose-Capillary: 109 mg/dL — ABNORMAL HIGH (ref 65–99)
Glucose-Capillary: 172 mg/dL — ABNORMAL HIGH (ref 65–99)
Glucose-Capillary: 68 mg/dL (ref 65–99)
Glucose-Capillary: 230 mg/dL — ABNORMAL HIGH (ref 65–99)

## 2016-09-24 LAB — BODY FLUID CELL COUNT WITH DIFFERENTIAL
EOS FL: 1 %
LYMPHS FL: 17 %
Monocyte-Macrophage-Serous Fluid: 41 % — ABNORMAL LOW (ref 50–90)
Neutrophil Count, Fluid: 41 % — ABNORMAL HIGH (ref 0–25)
Total Nucleated Cell Count, Fluid: 1448 cu mm — ABNORMAL HIGH (ref 0–1000)

## 2016-09-24 LAB — RENAL FUNCTION PANEL
Albumin: 1.9 g/dL — ABNORMAL LOW (ref 3.5–5.0)
Anion gap: 10 (ref 5–15)
BUN: 76 mg/dL — AB (ref 6–20)
CO2: 28 mmol/L (ref 22–32)
Calcium: 8.5 mg/dL — ABNORMAL LOW (ref 8.9–10.3)
Chloride: 103 mmol/L (ref 101–111)
Creatinine, Ser: 5.08 mg/dL — ABNORMAL HIGH (ref 0.61–1.24)
GFR calc Af Amer: 12 mL/min — ABNORMAL LOW (ref >60)
GFR calc non Af Amer: 11 mL/min — ABNORMAL LOW (ref >60)
GLUCOSE: 61 mg/dL — AB (ref 65–99)
POTASSIUM: 3.5 mmol/L (ref 3.5–5.1)
Phosphorus: 4.8 mg/dL — ABNORMAL HIGH (ref 2.5–4.6)
Sodium: 141 mmol/L (ref 135–145)

## 2016-09-24 LAB — LACTATE DEHYDROGENASE, PLEURAL OR PERITONEAL FLUID: LD, Fluid: 524 U/L — ABNORMAL HIGH (ref 3–23)

## 2016-09-24 LAB — PROTEIN, BODY FLUID: TOTAL PROTEIN, FLUID: 4.2 g/dL

## 2016-09-24 LAB — PROTIME-INR
INR: 2.64
PROTHROMBIN TIME: 28.7 s — AB (ref 11.4–15.2)

## 2016-09-24 MED ORDER — WARFARIN SODIUM 2.5 MG PO TABS
2.5000 mg | ORAL_TABLET | Freq: Once | ORAL | Status: AC
  Administered 2016-09-24: 2.5 mg via ORAL
  Filled 2016-09-24: qty 1

## 2016-09-24 MED ORDER — SODIUM BICARBONATE 650 MG PO TABS
650.0000 mg | ORAL_TABLET | Freq: Three times a day (TID) | ORAL | Status: DC
  Administered 2016-09-24 – 2016-09-27 (×8): 650 mg via ORAL
  Filled 2016-09-24 (×8): qty 1

## 2016-09-24 NOTE — Care Management Important Message (Signed)
Important Message  Patient Details  Name: Gary Lang MRN: [id number] Date of Birth: [birth year]   Medicare Important Message Given:  Yes    Maury Groninger Montine Circle 09/24/2016, 12:01 PM

## 2016-09-24 NOTE — Progress Notes (Signed)
PROGRESS NOTE  Gary Lang JIR:[id number] DOB: 06/16/1951 DOA: 09/05/2016 PCP: [name], MD  Brief Summary:  Brief:   66 y.o. 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  He was admitted 09/05/16 with  E.Coli bacteremia due to acute cholecystitis. He was seen by [facility]CS and had percutaneous drain placed 01/09 as CCS felt he was not stable enough for full surgery. Since admission, he has had worsening in renal function and also developed new onset paroxysmal atrial fib. On 1/10 Due to worsening renal function started on CVVHD (prefered over HD due to HR).  SIGNIFICANT EVENTS: 1/04 > admit sepsis > e.coli bacteremia secondary to acute cholecystitis 1/07> Percutaneous drain placed by IR 1/10 > cvvhd, fib rvr  CULTURES: Blood 01/04 > E.Coli. Gallbladder fluid 01/07 > E.Coli, K.Pneumoniae.  ANTIBIOTICS: Azithro 01/04 > 01/10 Ceftriaxone 01/04 > 01/05, 1/11 >  Zosyn 01/04 >1/11  LINES/TUBES: RIJ Vascath 1/11 > both a temporary right IJ dialysis catheter and a left radiocephalic fistula (mature) suitable for use.  Patient was admitted to [facility], transferred to ICU to have CRRT Back to [facility] service on 1/14  HPI/Recap of past 24 hours:  Pt looking forward to going home soon.  Assessment/Plan: Active Problems:   Community acquired pneumonia of right lower lobe of lung ([place])   Acute respiratory failure with hypoxia ([place])   Acute cholecystitis due to biliary calculus   AKI (acute kidney injury) ([place])   Abdominal distention, non-gaseous   Acute abdominal pain   Fever   PAF (paroxysmal atrial fibrillation) ([place])  #Sepsis due to E Coli bacteremia and possibly commonly acquired pneumonia/acute cholecystitis: Chest x-ray showed right lower lobe pneumonia. Patient with tachycardia, leukocytosis, fever and abnormal x-ray finding on admission -2 out of 2 blood cultures + Escherichia coli. Drain culture with  ESCHERICHIA COLI and  KLEBSIELLA PNEUMONIAE --S/p perc cholecystostomy 09/08/16.per IR needsOP follow-up in IR clinic in 5-6 weeks -general surgery oked to start coumadin, patient is to follow up with general surgery in a few weeks for elective laparoscopic cholecystectomy  - sepsis resolving, taper off steroids, finished 10 days of iv abx, d/c abx on 1/14, repeat blood culture no growth  #Acute hypoxic respiratory failure likely in the setting of pneumonia and sepsis:  - Finished abx treatment on 1/14, Improved, on room air.  - Pt with wheezes prior to discharge. Will consult pulmonologist to see if there are any more treatment options given reports of asymmetric pleural effusion   Blood loss anemia - reassess hgb levels - resolved with discontinuation of heparin and aspirin. Currently patient on coumadin.  #Acute on chronic kidney disease, history of FSGN , has AVF in place,   - Awaiting final nephrology recommendations  #Elevated troponin likely in the setting of sepsis/aki: Trend cardiac enzymes in the setting of new onset atrial fibrillation -Echocardiogram showed vigorous left ventricular systolic function with EF of 65-30%. There were no regional wall motion abnormalities. Also with  grade 1 diastolic dysfunction. Converted to sinus rhythm, currently on ccb Both general surgery and cardiology oked with transition from heparin drip to  Coumadin on 1/15 Cardiology signed off on 1/15, outpatient follow up recommended he is new on coumadin, coumadin teaching, patient needs to have outpatient coumadin level checked. - INR at goal today.  Uncontrolled diabetes, insulin dependent Required insulin drip initially Now on Lantus, sliding scale insulin,  Blood sugar elevated, increase insulin, taper steroids a1c 9.4  HTN; on ccb.  clonidine  Physical deconditioning Prior to hospitalization, he is independent with ADLs. PT eval, will need home health, drain care/PT if patient agrees to.  DVT prophylaxis:  on coumadin Code Status: Full code Family Communication: patient and wife Disposition Plan: Will await pulmonology evaluation   Consultants:   Nephrology  Cardiology  critical care  General surgery  IR  Pulmonology     Objective: BP (!) 139/57 (BP Location: Right Arm)   Pulse 91   Temp 98 F (36.7 C) (Oral)   Resp 18   Ht 6' 2.5" (1.892 m)   Wt 109.7 kg (241 lb 12.8 oz)   SpO2 98%   BMI 30.63 kg/m   Intake/Output Summary (Last 24 hours) at 09/24/16 1432 Last data filed at 09/24/16 1300  Gross per 24 hour  Intake              935 ml  Output             2870 ml  Net            -1935 ml   Filed Weights   09/19/16 2037 09/20/16 2030 09/21/16 2000  Weight: 111.6 kg (246 lb) 111.6 kg (246 lb 0.5 oz) 109.7 kg (241 lb 12.8 oz)    Exam:   General:  NAD, alert and awake  Cardiovascular: RRR, no rubs  Respiratory: CTABL, no wheezes, decreased breath sounds over right lung field.  Abdomen: Soft/ND/NT, positive BS,  perc cholecystostomy in place  Musculoskeletal: No Edema, no cyanosis  Neuro: aaox3  Data Reviewed: Basic Metabolic Panel:  Recent Labs Lab 09/18/16 0651 09/19/16 0647 09/20/16 0746 09/21/16 0411 09/22/16 0418 09/22/16 1326 09/24/16 0518  NA 135 136 139 140  --  135 141  K 4.1 3.9 4.3 4.4  --  5.1 3.5  CL 99* 100* 103 104  --  102 103  CO2 '23 22 24 26  '$ --  22 28  GLUCOSE 92 59* 73 71  --  326* 61*  BUN 108* 105* 97* 87*  --  86* 76*  CREATININE 6.10* 6.22* 6.18* 6.30*  --  5.90* 5.08*  CALCIUM 8.1* 7.9* 7.9* 8.1*  --  8.1* 8.5*  MG 2.6* 2.4 2.4 2.3 2.5*  --   --   PHOS 8.2* 7.3* 6.3* 6.4*  --  6.2* 4.8*   Liver Function Tests:  Recent Labs Lab 09/19/16 0647 09/20/16 0746 09/21/16 0411 09/22/16 1326 09/24/16 0518  ALBUMIN 1.7* 1.6* 1.6* 1.7* 1.9*   No results for input(s): LIPASE, AMYLASE in the last 168 hours. No results for input(s): AMMONIA in the last 168 hours. CBC:  Recent Labs Lab 09/20/16 0746  09/21/16 0411 09/22/16 0418 09/23/16 0631 09/24/16 0518  WBC 13.7* 10.8* 8.2 11.8* 11.9*  HGB 9.2* 9.3* 8.8* 9.1* 9.4*  HCT 28.3* 29.4* 27.9* 29.6* 30.4*  MCV 81.6 83.1 84.0 83.9 84.4  PLT 265 235 231 266 254   Cardiac Enzymes:   No results for input(s): CKTOTAL, CKMB, CKMBINDEX, TROPONINI in the last 168 hours. BNP (last 3 results)  Recent Labs  09/07/16 0719  BNP 101.0*    ProBNP (last 3 results) No results for input(s): PROBNP in the last 8760 hours.  CBG:  Recent Labs Lab 09/23/16 1234 09/23/16 1656 09/23/16 2226 09/24/16 0823 09/24/16 1208  GLUCAP 96 155* 155* 109* 172*    No results found for this or any previous visit (from the past 240 hour(s)).   Studies: Dg Chest 2v Repeat Same Day  Result Date:  09/23/2016 CLINICAL DATA:  Acute onset of central chest pain, shortness of breath, wheezing, productive cough, congestion and nausea. Initial encounter. EXAM: CHEST  2 VIEW COMPARISON:  Chest radiograph performed 09/14/2016 FINDINGS: The lungs are well-aerated. A large right-sided pleural effusion is noted, with right-sided airspace opacification, concerning for asymmetric pulmonary edema. There is no evidence of pneumothorax. The heart is borderline normal in size. No acute osseous abnormalities are seen. IMPRESSION: Large right-sided pleural effusion, with right-sided airspace opacification, concerning for asymmetric pulmonary edema. Electronically Signed   By: Garald Balding M.D.   On: 09/23/2016 19:36    Scheduled Meds: . benzonatate  100 mg Oral TID  . calcium acetate  667 mg Oral TID WC  . cholecalciferol  1,000 Units Oral Daily  . cloNIDine  0.3 mg Oral TID  . diltiazem  240 mg Oral Daily  . feeding supplement (NEPRO CARB STEADY)  237 mL Oral BID BM  . furosemide  40 mg Oral BID  . insulin aspart  0-9 Units Subcutaneous TID AC & HS  . insulin aspart  5 Units Subcutaneous TID WC  . insulin glargine  50 Units Subcutaneous QHS  . pantoprazole  40 mg Oral  Daily  . saccharomyces boulardii  250 mg Oral BID  . senna-docusate  1 tablet Oral QHS  . sodium bicarbonate  1,300 mg Oral TID  . warfarin  2.5 mg Oral ONCE-1800  . Warfarin - Pharmacist Dosing Inpatient   Does not apply q1800    Continuous Infusions:  Time spent: 35 mins  Velvet Bathe MD, PhD  [facility] Pager [phone] If 7PM-7AM, please contact night-coverage at [email or web address], password [facility] 09/24/2016, 2:32 PM  LOS: 19 days

## 2016-09-24 NOTE — Progress Notes (Signed)
Name: Gary Lang MRN: [id number] DOB: March 27, 1951    ADMISSION DATE:  09/05/2016 CONSULTATION DATE:  09/24/16  REFERRING MD :  Dr. Wendee Beavers   CHIEF COMPLAINT:  SOB, Pleural Effusion    BRIEF SUMMARY:  66 y/o M admitted 1/4 with RUQ abdominal pain, decreased appetite, fever and SOB.  Evaluation found him to have sepsis secondary to E-Coli bacteremia in the setting of acute cholecystitis.  On admit, there wer also concerns for possible RLL PNA/CAP.  The patient was evaluated by CCS but felt not stable enough for surgery.  A percutaneous cholecystostomy drain was placed on 09/08/16 by IR.  Hospital course complicated by new onset paroxysmal atrial fibrillation.  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  During hospitalization, he had worsening renal failure in the setting of sepsis and required CVVHD.  Blood cultures grew E-Coli and gallbladder fluid grew E-Coli and K. Pneumoniae.  He was treated with IV antibiotics (rocephin / azithro / zosyn).  Initial CT of the abdomen showed fluid collection near the gallbladder.  The patient later developed increased SOB, cough and wheezing.  CXR noted increased pleural effusion.  PCCM called back 1/23 for evaluation.    SUBJECTIVE:  Pt denies acute complaints.    VITAL SIGNS: Temp:  [98 F (36.7 C)-99.3 F (37.4 C)] 98 F (36.7 C) (01/23 0700) Pulse Rate:  [85-104] 91 (01/23 1058) Resp:  [16-19] 18 (01/23 0700) BP: (132-177)/(51-105) 139/57 (01/23 1058) SpO2:  [92 %-99 %] 98 % (01/23 1058)  PHYSICAL EXAMINATION: General:  Well developed male in NAD  Neuro:  AAOx4 HEENT:  MM pink/moist, no jvd  Cardiovascular:  s1s2 distant Lungs:  Even/non-labored, diminished on R, faint wheeze on R  Abdomen:  Obese/soft, bsx4 active  Musculoskeletal:  No acute deformities  Skin:  Warm/dry, no edema    Recent Labs Lab 09/21/16 0411 09/22/16 1326 09/24/16 0518  NA 140 135 141  K 4.4 5.1 3.5  CL 104 102  103  CO2 '26 22 28  '$ BUN 87* 86* 76*  CREATININE 6.30* 5.90* 5.08*  GLUCOSE 71 326* 61*     Recent Labs Lab 09/22/16 0418 09/23/16 0631 09/24/16 0518  HGB 8.8* 9.1* 9.4*  HCT 27.9* 29.6* 30.4*  WBC 8.2 11.8* 11.9*  PLT 231 266 254    Dg Chest 2v Repeat Same Day  Result Date: 09/23/2016 CLINICAL DATA:  Acute onset of central chest pain, shortness of breath, wheezing, productive cough, congestion and nausea. Initial encounter. EXAM: CHEST  2 VIEW COMPARISON:  Chest radiograph performed 09/14/2016 FINDINGS: The lungs are well-aerated. A large right-sided pleural effusion is noted, with right-sided airspace opacification, concerning for asymmetric pulmonary edema. There is no evidence of pneumothorax. The heart is borderline normal in size. No acute osseous abnormalities are seen. IMPRESSION: Large right-sided pleural effusion, with right-sided airspace opacification, concerning for asymmetric pulmonary edema. Electronically Signed   By: Garald Balding M.D.   On: 09/23/2016 19:36      SIGNIFICANT EVENTS  1/04  Admit with RUQ abd pain 1/07  Percutaneous cholecystostomy tube placement  1/11  To ICU, R IJ HD cath placed for CVVHD 1/14  To [facility]  1/23  PCCM called back for pleural effusion  STUDIES:  1/06  CT Chest w/o >> multifocal atelectasis of RML, RLL, LLL, trace L effusion 1/06  CT Abd/Pelvis >> extensive pericholecystic fluid, small non-calcified gallstone or sludge near the neck of the gallbladder, inflammatory changes around the  gallbladder > concern for acute cholecystitis 1/10  CT Abd/Pelvis >> interval placement of cholecystostomy catheter with gallbladder decompression, subcapsular fluid collection decreased, increased moderate R pleural effusion with R lower consolidation   ASSESSMENT / PLAN:  Discussion:  66 y/o M admitted with sepsis/bacteremia secondary to acute cholecystitis s/p percutaneous cholecystostomy drain placement.  Course complicated by PAF, elevated troponin,  acute on chronic CKD with known hx of FSGS.  PCCM called back 1/23 for assessment of wheezing / increased right pleural effusion.    1.  Right Pleural Effusion - likely multifactorial in the setting of recent e-coli bacteremia, possible CAP and sympathetic effusion from acute cholecystitis.  Rule out empyema / complicated pleural space, hemothorax on coumadin.   Plan: - assess R pleural space with ultrasound & plan for thoracentesis - send pleural fluid for LDH, protein, cytology, bilirubin, Hct, cell count / differential, culture - pcxr post thora - recommend holding overnight for pleural fluid review in am   2.  Wheezing - focal wheezing on the R side, may be related to collapse/atelectasis with mucus  Plan: - drain pleural fluid   3.  E-Coli Bacteremia - resolving, completed 10 day course abx   Plan: - per primary  4.  Coagulopathy - INR 2.64 on coumadin for PAF  Plan: - per primary    [name], NP-C [facility] Pgr: [phone] or if no answer [phone] 09/24/2016, 2:55 PM

## 2016-09-24 NOTE — Progress Notes (Signed)
PT Cancellation Note  Patient Details Name: BINH DOTEN MRN: [id number] DOB: 16-Feb-1951   Cancelled Treatment:    Reason Eval/Treat Not Completed: Medical issues which prohibited therapy.  Just back from thoracentesis and also low CBG's, will see tomorrow as able. 09/24/2016  Donnella Sham, PT [phone]  (pager)   Tessie Fass Kathleen Likins 09/24/2016, 5:52 PM

## 2016-09-24 NOTE — Progress Notes (Signed)
Gary Lang Subjective:  Feeling better Thoracentesis done today studies pending Wants to get home  Objective:  Intake/Output Summary (Last 24 hours) at 09/24/16 1733 Last data filed at 09/24/16 1300  Gross per 24 hour  Intake              930 ml  Output             2020 ml  Net            -1090 ml  BP (!) 111/56 (BP Location: Right Arm)   Pulse 89   Temp 100.2 F (37.9 C) (Oral)   Resp 18   Ht 6' 2.5" (1.892 m)   Wt 109.7 kg (241 lb 12.8 oz)   SpO2 94%   BMI 30.63 kg/m   Physical Examination Pleasant AAM, NAD Wearing nasal cannula O2 CCM in room for thoracentesis No JVD Lungs clear, diminished at R base Abd soft, perc chole tube in place, brown fluid draining No sig LE edema L FA AVF + bruit and thrill   Recent Labs  09/23/16 0631 09/24/16 0518  WBC 11.8* 11.9*  HGB 9.1* 9.4*  HCT 29.6* 30.4*  PLT 266 254    Recent Labs  09/22/16 1326 09/24/16 0518  NA 135 141  K 5.1 3.5  CL 102 103  CO2 22 28  GLUCOSE 326* 61*  BUN 86* 76*  CREATININE 5.90* 5.08*  CALCIUM 8.1* 8.5*    Dg Chest 2v Repeat Same Day  Result Date: 09/23/2016 CLINICAL DATA:  Acute onset of central chest pain, shortness of breath, wheezing, productive cough, congestion and nausea. Initial encounter. EXAM: CHEST  2 VIEW COMPARISON:  Chest radiograph performed 09/14/2016 FINDINGS: The lungs are well-aerated. A large right-sided pleural effusion is noted, with right-sided airspace opacification, concerning for asymmetric pulmonary edema. There is no evidence of pneumothorax. The heart is borderline normal in size. No acute osseous abnormalities are seen. IMPRESSION: Large right-sided pleural effusion, with right-sided airspace opacification, concerning for asymmetric pulmonary edema. Electronically Signed   By: Garald Balding M.D.   On: 09/23/2016 19:36    Scheduled medications . benzonatate  100 mg Oral TID  . calcium acetate  667 mg Oral TID WC  . cholecalciferol  1,000 Units Oral  Daily  . cloNIDine  0.3 mg Oral TID  . diltiazem  240 mg Oral Daily  . feeding supplement (NEPRO CARB STEADY)  237 mL Oral BID BM  . furosemide  40 mg Oral BID  . insulin aspart  0-9 Units Subcutaneous TID AC & HS  . insulin aspart  5 Units Subcutaneous TID WC  . insulin glargine  50 Units Subcutaneous QHS  . pantoprazole  40 mg Oral Daily  . saccharomyces boulardii  250 mg Oral BID  . senna-docusate  1 tablet Oral QHS  . sodium bicarbonate  1,300 mg Oral TID  . warfarin  2.5 mg Oral ONCE-1800  . Warfarin - Pharmacist Dosing Inpatient   Does not apply q1800     Background: 66 yo AAM with CKD 2/2 FSGS, baseline CKD 3, DM, HTNadmitted 1/4 with EColi bacteremia 2/2 acute cholecystitis, s/p perc cholecystostomy, RLL PNA, resp failure, new onset AFib. Developed AKI on CKD, required CRRT 1/10 for 48 hours transiently primarily for ultrafiltration, did not require HD, renal function spontaneously improving. Required thoracentesis 1/23 for R pleural effusion.   Assessment/ Plan:    1. Acute renal failure on chronic kidney disease stage DGL:[id number] CKD from FSGS/diabetes and hypertension. Transiently on  CRRT for acute dyspnea from pulmonary edema/worsening renal function and A. flutter with RVR. Creatinine plateaued, is starting to downtrend and is . Has a left radiocephalic fistula (mature) suitable for use. Continue Lasix 40 PO BID.  Will need to arrange close followup with Dr. Florene Glen 2. Right lower lobe pneumonia/acute cholecystitis:  S/p broad-spectrum antibiotics and s/p percutaneous cholecystostomy-- will need cholecystectomy as an outpatient.   3. R pleural effusion - s/p thoracentesis of 800 cc bloody fluid by CCM today. Studies pending.  4. Hyponatremia, resolved.  5. Volume overload: improved; continue fluid restriction and diuretics. 6. Hypertension: hypertensive agents-continue to monitor. 7. Hyperphosphatemia: resumed phoslo 1 tab TID with meals 8. Atrial flutter with rapid  ventricular response:now sinus rhythm, s/p  heparin drip & now on warfarin--INR therapeutic. On Cardizem 240 daily 9. Metabolic acidosis: on sodium bicarb 1300 TID - can reduce to 650 TID (CO2 28) 10. Disposition - from a renal standpoint OK for home.will arrange f/u with Dr. Clydene Laming, MD [facility] [phone] Pager 09/24/2016, 5:24 PM

## 2016-09-24 NOTE — Procedures (Signed)
Thoracentesis Procedure Note  Pre-operative Diagnosis: Right Pleural Effusion   Post-operative Diagnosis: same  Indications: Diagnostic evaluation of pleural fluid and therapeutic relief of dyspnea.   Procedure Details  Consent: Informed consent was obtained. Risks of the procedure were discussed including: infection, bleeding, pain, pneumothorax.  Under sterile conditions the patient was positioned. Betadine solution and sterile drapes were utilized.  1% plain lidocaine was used to anesthetize the 7th rib space. Fluid was obtained without any difficulties and minimal blood loss.  A dressing was applied to the wound and wound care instructions were provided.   Findings 800 ml of bloody pleural fluid was obtained. A sample was sent to Pathology for cytogenetics, flow, and cell counts, as well as for infection analysis.  Complications:  None; patient tolerated the procedure well.          Condition: stable  Plan A follow up chest x-ray was ordered. Patient tolerated procedure well.     Procedure performed under direct supervision of Dr. Vaughan Browner and with ultrasound guidance.     [name], NP-C [place] Pgr: [phone] or if no answer [phone] 09/24/2016, 5:16 PM

## 2016-09-24 NOTE — Progress Notes (Signed)
CRITICAL VALUE STICKER  CRITICAL VALUE: CBG 8  RECEIVER (on-site recipient of call):  Portal NOTIFIED:  1715 MESSENGER (representative from lab):  MD NOTIFIED: Dr. Wendee Beavers   TIME OF NOTIFICATION:1735  RESPONSE:

## 2016-09-24 NOTE — Progress Notes (Signed)
CBG of 91. Pt had nepro drink and ate dinner.   Paulla Fore, RN

## 2016-09-24 NOTE — Progress Notes (Addendum)
ANTICOAGULATION CONSULT NOTE - Follow Up Consult  Pharmacy Consult for warfarin Indication: atrial fibrillation  Patient Measurements: Height: 6' 2.5" (189.2 cm) Weight: 241 lb 12.8 oz (109.7 kg) IBW/kg (Calculated) : 83.35   Labs:  Recent Labs  09/22/16 0418 09/22/16 1326 09/23/16 0631 09/24/16 0518  HGB 8.8*  --  9.1* 9.4*  HCT 27.9*  --  29.6* 30.4*  PLT 231  --  266 254  LABPROT 31.0*  --  35.5* 28.7*  INR 2.91  --  3.45 2.64  CREATININE  --  5.90*  --  5.08*    Estimated Creatinine Clearance: 19 mL/min (by C-G formula based on SCr of 5.08 mg/dL (H)).   Assessment: 62 YOM with new onset Afib with RVR.  The patient is s/p heparin drip. Oral anticoagulation was held pending possible surgical plans for cholecystitis. There is no immediate surgery planned so pharmacy was consulted to add new warfarin starting 1/15.   Hemoglobin remains stable in 9's, platelets are normal. No bleeding noted. INR back down in range today at 2.64.  Goal of Therapy:  INR 2-3 Monitor platelets by anticoagulation protocol: Yes   Plan:  - warfarin 2.'5mg'$  po tonight- one time dose ordered that can be given to patient prior to expected discharge today - Daily INR while in the hospital - Monitor for s/sx of bleeding, diet change, drug interactions  - Recommend patient takes warfarin 2.'5mg'$  po daily starting 1/24 (as patient should receive 1/23's dose in the hospital) with an INR check on Thurs 1/25  Thank you for allowing pharmacy to be a part of this patient's care.  Latorie Montesano D. Mazell Aylesworth, PharmD, BCPS Clinical Pharmacist Pager: [phone] 09/24/2016 10:59 AM

## 2016-09-25 DIAGNOSIS — R509 Fever, unspecified: Secondary | ICD-10-CM

## 2016-09-25 LAB — CBC
HCT: 28.7 % — ABNORMAL LOW (ref 39.0–52.0)
Hemoglobin: 8.9 g/dL — ABNORMAL LOW (ref 13.0–17.0)
MCH: 27.1 pg (ref 26.0–34.0)
MCHC: 31 g/dL (ref 30.0–36.0)
MCV: 87.2 fL (ref 78.0–100.0)
Platelets: 207 10*3/uL (ref 150–400)
RBC: 3.29 MIL/uL — ABNORMAL LOW (ref 4.22–5.81)
RDW: 15.4 % (ref 11.5–15.5)
WBC: 10 10*3/uL (ref 4.0–10.5)

## 2016-09-25 LAB — RENAL FUNCTION PANEL
ALBUMIN: 1.7 g/dL — AB (ref 3.5–5.0)
ANION GAP: 10 (ref 5–15)
ANION GAP: 12 (ref 5–15)
Albumin: 1.7 g/dL — ABNORMAL LOW (ref 3.5–5.0)
BUN: 66 mg/dL — ABNORMAL HIGH (ref 6–20)
BUN: 65 mg/dL — ABNORMAL HIGH (ref 6–20)
CALCIUM: 8.3 mg/dL — AB (ref 8.9–10.3)
CO2: 29 mmol/L (ref 22–32)
CO2: 30 mmol/L (ref 22–32)
Calcium: 8.3 mg/dL — ABNORMAL LOW (ref 8.9–10.3)
Chloride: 101 mmol/L (ref 101–111)
Chloride: 98 mmol/L — ABNORMAL LOW (ref 101–111)
Creatinine, Ser: 4.57 mg/dL — ABNORMAL HIGH (ref 0.61–1.24)
Creatinine, Ser: 4.5 mg/dL — ABNORMAL HIGH (ref 0.61–1.24)
GFR calc Af Amer: 14 mL/min — ABNORMAL LOW (ref >60)
GFR calc non Af Amer: 12 mL/min — ABNORMAL LOW (ref >60)
GFR, EST AFRICAN AMERICAN: 14 mL/min — AB (ref >60)
GFR, EST NON AFRICAN AMERICAN: 12 mL/min — AB (ref >60)
GLUCOSE: 60 mg/dL — AB (ref 65–99)
GLUCOSE: 139 mg/dL — AB (ref 65–99)
PHOSPHORUS: 4.1 mg/dL (ref 2.5–4.6)
POTASSIUM: 3.5 mmol/L (ref 3.5–5.1)
Phosphorus: 4 mg/dL (ref 2.5–4.6)
Potassium: 3.6 mmol/L (ref 3.5–5.1)
SODIUM: 140 mmol/L (ref 135–145)
SODIUM: 140 mmol/L (ref 135–145)

## 2016-09-25 LAB — PROTEIN, TOTAL: Total Protein: 7.3 g/dL (ref 6.5–8.1)

## 2016-09-25 LAB — GLUCOSE, CAPILLARY
GLUCOSE-CAPILLARY: 111 mg/dL — AB (ref 65–99)
Glucose-Capillary: 60 mg/dL — ABNORMAL LOW (ref 65–99)
Glucose-Capillary: 200 mg/dL — ABNORMAL HIGH (ref 65–99)
Glucose-Capillary: 293 mg/dL — ABNORMAL HIGH (ref 65–99)
Glucose-Capillary: 277 mg/dL — ABNORMAL HIGH (ref 65–99)

## 2016-09-25 LAB — LACTATE DEHYDROGENASE: LDH: 223 U/L — AB (ref 98–192)

## 2016-09-25 LAB — PROTIME-INR
INR: 2.48
Prothrombin Time: 27.3 seconds — ABNORMAL HIGH (ref 11.4–15.2)

## 2016-09-25 LAB — PROCALCITONIN: PROCALCITONIN: 1.34 ng/mL

## 2016-09-25 MED ORDER — IPRATROPIUM BROMIDE 0.02 % IN SOLN
0.5000 mg | Freq: Three times a day (TID) | RESPIRATORY_TRACT | Status: DC
  Administered 2016-09-26: 0.5 mg via RESPIRATORY_TRACT
  Filled 2016-09-25: qty 2.5

## 2016-09-25 MED ORDER — INSULIN GLARGINE 100 UNIT/ML ~~LOC~~ SOLN
40.0000 [IU] | Freq: Every day | SUBCUTANEOUS | Status: DC
  Administered 2016-09-25 – 2016-09-27 (×3): 40 [IU] via SUBCUTANEOUS
  Filled 2016-09-25 (×3): qty 0.4

## 2016-09-25 MED ORDER — LEVALBUTEROL HCL 1.25 MG/0.5ML IN NEBU
1.2500 mg | INHALATION_SOLUTION | Freq: Four times a day (QID) | RESPIRATORY_TRACT | Status: DC
  Administered 2016-09-25 (×2): 1.25 mg via RESPIRATORY_TRACT
  Filled 2016-09-25 (×3): qty 0.5

## 2016-09-25 MED ORDER — IPRATROPIUM BROMIDE 0.02 % IN SOLN
0.5000 mg | Freq: Four times a day (QID) | RESPIRATORY_TRACT | Status: DC
  Administered 2016-09-25 (×3): 0.5 mg via RESPIRATORY_TRACT
  Filled 2016-09-25 (×2): qty 2.5

## 2016-09-25 MED ORDER — WARFARIN SODIUM 2.5 MG PO TABS
2.5000 mg | ORAL_TABLET | Freq: Once | ORAL | Status: AC
  Administered 2016-09-25: 2.5 mg via ORAL
  Filled 2016-09-25: qty 1

## 2016-09-25 MED ORDER — LEVALBUTEROL HCL 1.25 MG/0.5ML IN NEBU
1.2500 mg | INHALATION_SOLUTION | Freq: Three times a day (TID) | RESPIRATORY_TRACT | Status: DC
  Administered 2016-09-26 – 2016-09-28 (×9): 1.25 mg via RESPIRATORY_TRACT
  Filled 2016-09-25 (×10): qty 0.5

## 2016-09-25 NOTE — Progress Notes (Signed)
Name: Gary Lang MRN: [id number] DOB: Jul 19, 1951    ADMISSION DATE:  09/05/2016 CONSULTATION DATE:  09/24/16  REFERRING MD :  Dr. Wendee Beavers   CHIEF COMPLAINT:  SOB, Pleural Effusion    BRIEF SUMMARY:  66 y/o M admitted 1/4 with RUQ abdominal pain, decreased appetite, fever and SOB.  Evaluation found him to have sepsis secondary to E-Coli bacteremia in the setting of acute cholecystitis.  On admit, there wer also concerns for possible RLL PNA/CAP.  The patient was evaluated by CCS but felt not stable enough for surgery.  A percutaneous cholecystostomy drain was placed on 09/08/16 by IR.  Hospital course complicated by new onset paroxysmal atrial fibrillation.  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  During hospitalization, he had worsening renal failure in the setting of sepsis and required CVVHD.  Blood cultures grew E-Coli and gallbladder fluid grew E-Coli and K. Pneumoniae.  He was treated with IV antibiotics (rocephin / azithro / zosyn).  Initial CT of the abdomen showed fluid collection near the gallbladder.  The patient later developed increased SOB, cough and wheezing.  CXR noted increased pleural effusion.  PCCM called back 1/23 for evaluation.    SUBJECTIVE:  Episode of hypoglycemia this am.  Pt denies SOB.  Reports cough is "loose & getting stuff up" where he wasn't before.  Denies fevers / chills.   VITAL SIGNS: Temp:  [98.6 F (37 C)-100.2 F (37.9 C)] 99.7 F (37.6 C) (01/24 0418) Pulse Rate:  [83-96] 83 (01/24 0418) Resp:  [18] 18 (01/24 0418) BP: (111-145)/(48-57) 129/53 (01/24 0418) SpO2:  [94 %-98 %] 96 % (01/24 0418)  PHYSICAL EXAMINATION: General: well developed adult male in NAD HEENT: MM pink/moist, no jvd Neuro: AAOx4, speech clear, MAE CV: s1s2 rrr, no m/r/g PULM: non-labored, lungs bilaterally diminished RLL with crackles, L wheezing  DP:OEUM, non-tender, bsx4 active  Extremities: warm/dry, no edema.  LUE  AVF with + thrill/bruit Skin: no rashes or lesions    Recent Labs Lab 09/21/16 0411 09/22/16 1326 09/24/16 0518  NA 140 135 141  K 4.4 5.1 3.5  CL 104 102 103  CO2 '26 22 28  '$ BUN 87* 86* 76*  CREATININE 6.30* 5.90* 5.08*  GLUCOSE 71 326* 61*     Recent Labs Lab 09/23/16 0631 09/24/16 0518 09/25/16 0805  HGB 9.1* 9.4* 8.9*  HCT 29.6* 30.4* 28.7*  WBC 11.8* 11.9* 10.0  PLT 266 254 207    Dg Chest 2v Repeat Same Day  Result Date: 09/23/2016 CLINICAL DATA:  Acute onset of central chest pain, shortness of breath, wheezing, productive cough, congestion and nausea. Initial encounter. EXAM: CHEST  2 VIEW COMPARISON:  Chest radiograph performed 09/14/2016 FINDINGS: The lungs are well-aerated. A large right-sided pleural effusion is noted, with right-sided airspace opacification, concerning for asymmetric pulmonary edema. There is no evidence of pneumothorax. The heart is borderline normal in size. No acute osseous abnormalities are seen. IMPRESSION: Large right-sided pleural effusion, with right-sided airspace opacification, concerning for asymmetric pulmonary edema. Electronically Signed   By: Garald Balding M.D.   On: 09/23/2016 19:36   Dg Chest Port 1 View  Result Date: 09/24/2016 CLINICAL DATA:  Pleural effusion EXAM: PORTABLE CHEST 1 VIEW COMPARISON:  09/23/2016 CXR, 09/07/2016 CT FINDINGS: Interval decrease in large right effusion/along the periphery of the right hemithorax. Right basilar atelectasis is identified. Chronic elevation of the right hemidiaphragm. Heart is top-normal in size. Aortic atherosclerosis is noted. There is  mild pulmonary vascular congestion. There is osteoarthritis about both AC and glenohumeral joints. IMPRESSION: Interval decrease in right effusion. Right basilar atelectasis. Aortic atherosclerosis. There is mild vascular congestion consistent with mild CHF. Electronically Signed   By: Ashley Royalty M.D.   On: 09/24/2016 18:06      SIGNIFICANT EVENTS   1/04  Admit with RUQ abd pain 1/07  Percutaneous cholecystostomy tube placement  1/11  To ICU, R IJ HD cath placed for CVVHD 1/14  To [facility]  1/23  PCCM called back for pleural effusion 1/23  Thora with 800 ml bloody fluid drained  STUDIES:  1/06  CT Chest w/o >> multifocal atelectasis of RML, RLL, LLL, trace L effusion 1/06  CT Abd/Pelvis >> extensive pericholecystic fluid, small non-calcified gallstone or sludge near the neck of the gallbladder, inflammatory changes around the gallbladder > concern for acute cholecystitis 1/10  CT Abd/Pelvis >> interval placement of cholecystostomy catheter with gallbladder decompression, subcapsular fluid collection decreased, increased moderate R pleural effusion with R lower consolidation   ASSESSMENT / PLAN:  Discussion:  66 y/o M admitted with sepsis/bacteremia secondary to acute cholecystitis s/p percutaneous cholecystostomy drain placement.  Course complicated by PAF, elevated troponin, acute on chronic CKD with known hx of FSGS.  PCCM called back 1/23 for assessment of wheezing / increased right pleural effusion.    1.  Exudative Right Pleural Effusion - likely multifactorial in the setting of recent e-coli bacteremia, possible CAP and sympathetic effusion from acute cholecystitis.  Rule out empyema / complicated pleural space, hemothorax on coumadin.   Plan: - follow pleural studies / cytology  - re-ordered serum LDH/protein > labs discontinued by "interface".  Likely exudate by pleural LDH - follow PCT, may need to resume abx if pleural space infected, note abundant WBC in pleural fluid on gram stain.  No WBC / fever and clinically stable currently.   - Repeat CXR in am - trend WBC / fever curve  - pt agreeable to waiting on cultures / cytology prior to discharge  2.  Wheezing - prior hx of smoking, also atelectasis/collapse may be contributing   Plan: - add xopenex / atrovent Q6  - PRN xopenex   3.  E-Coli Bacteremia - resolving,  completed 10 day course abx   Plan: - per primary  4.  Coagulopathy - INR 2.64 on coumadin for PAF  Plan: - per primary    [name], NP-C  Pulmonary & Critical Care Pgr: [phone] or if no answer [phone] 09/25/2016, 8:41 AM  STAFF NOTE: I, Merrie Roof, MD FACP have personally reviewed patient's available data, including medical history, events of note, physical examination and test results as part of my evaluation. I have discussed with resident/NP and other care providers such as pharmacist, RN and RRT. In addition, I personally evaluated patient and elicited key findings of: awake in bed, feels better with breathing after thoracentesis,  Pleural fluid analysis shows exudate and WBC noted, this is likely sympathetic effusion in setting cholecystitis in setting anticoagulation, he is afebrile and does not look septic/toxic and would not extend ABX duration unless he declines, PCT was re assuring for now, follow cytology further, follow pcxr bases  [name]. Titus Mould, MD, [place] Pgr: [facility] 09/25/2016 6:38 PM

## 2016-09-25 NOTE — Progress Notes (Signed)
Lab called to inform this RN that Hematocrit test on pleural fluid was non existent. Informed MD Wendee Beavers.   Paulla Fore, RN

## 2016-09-25 NOTE — Progress Notes (Signed)
CRITICAL VALUE STICKER  CRITICAL VALUE: CBG 63   RECEIVER (on-site recipient of call):  DATE & TIME NOTIFIED:   MESSENGER (representative from lab):  MD NOTIFIED: MD Wendee Beavers   TIME OF NOTIFICATION:0831  RESPONSE:

## 2016-09-25 NOTE — Progress Notes (Signed)
Physical Therapy Treatment Patient Details Name: VIRAJ LIBY MRN: [id number] DOB: [birth year] Today's Date: 09/25/2016    History of Present Illness pt presents from outside hospital with PNA, acute on chronic kidney disease, and Cholecystitis now s/p drain placement.  pt with hx of CKD, DM, HTN, and RA.      PT Comments    Has regressed a bit from recent worsening of respiratory status.  Decreased activity tolerance.   Follow Up Recommendations  Home health PT;Supervision/Assistance - 24 hour     Equipment Recommendations  Rolling walker with 5" wheels;3in1 (PT)    Recommendations for Other Services       Precautions / Restrictions Precautions Precautions: Fall    Mobility  Bed Mobility               General bed mobility comments: in chair on arrival  Transfers Overall transfer level: Needs assistance Equipment used: Rolling walker (2 wheeled) Transfers: Sit to/from Stand Sit to Stand: Min guard         General transfer comment: cues for hand placement.  Ambulation/Gait Ambulation/Gait assistance: Min guard Ambulation Distance (Feet): 95 Feet Assistive device: Rolling walker (2 wheeled) Gait Pattern/deviations: Trunk flexed Gait velocity: slower Gait velocity interpretation: Below normal speed for age/gender General Gait Details: slow and guarded   Stairs            Wheelchair Mobility    Modified Rankin (Stroke Patients Only)       Balance Overall balance assessment: Needs assistance Sitting-balance support: No upper extremity supported;Feet supported Sitting balance-Leahy Scale: Good     Standing balance support: Bilateral upper extremity supported;Single extremity supported Standing balance-Leahy Scale: Poor Standing balance comment: reliant on RW                    Cognition Arousal/Alertness: Awake/alert Behavior During Therapy: WFL for tasks assessed/performed Overall Cognitive Status: Within Functional Limits for  tasks assessed                      Exercises      General Comments General comments (skin integrity, edema, etc.): pt's sats in low 90's on 3 L [place]      Pertinent Vitals/Pain Pain Assessment: Faces Faces Pain Scale: Hurts a little bit Pain Location: knee R Pain Descriptors / Indicators: Sore Pain Intervention(s): Monitored during session    Home Living                      Prior Function            PT Goals (current goals can now be found in the care plan section) Acute Rehab PT Goals Patient Stated Goal: Pt wants to go home.  PT Goal Formulation: With patient Time For Goal Achievement: 09/23/16 Potential to Achieve Goals: Good Progress towards PT goals: Progressing toward goals    Frequency    Min 3X/week      PT Plan Current plan remains appropriate    Co-evaluation             End of Session Equipment Utilized During Treatment: Oxygen Activity Tolerance: Patient tolerated treatment well Patient left: in chair;with call bell/phone within reach     Time: 1231-1246 PT Time Calculation (min) (ACUTE ONLY): 15 min  Charges:  $Gait Training: 8-22 mins                    G Codes:      Chrissie Noa  V Tabitha Tupper 09/25/2016, 12:53 PM 09/25/2016  Donnella Sham, PT [phone]  (pager)

## 2016-09-25 NOTE — Progress Notes (Signed)
[facility] Rounding Note Subjective:  More SOB this afternoon Wheezing CCM added xopenex/atrovent Continued good UOP 2200 last 24 hours  Objective:  Intake/Output Summary (Last 24 hours) at 09/25/16 1502 Last data filed at 09/25/16 1333  Gross per 24 hour  Intake              485 ml  Output             1825 ml  Net            -1340 ml  BP (!) 131/51 (BP Location: Right Arm)   Pulse 92   Temp 98.6 F (37 C) (Oral)   Resp 17   Ht 6' 2.5" (1.892 m)   Wt 109.7 kg (241 lb 12.8 oz)   SpO2 95%   BMI 30.63 kg/m   Physical Examination Very nice AAM, coughing, uncomfortable 2/2 wheezing Wearing nasal cannula O2 No JVD Decreased BS R base, left side w/exp wheeze Abd soft, perc chole tube in place, brown fluid  No sig LE edema L FA AVF + bruit and thrill   Recent Labs  09/24/16 0518 09/25/16 0805  WBC 11.9* 10.0  HGB 9.4* 8.9*  HCT 30.4* 28.7*  PLT 254 207    Recent Labs  09/25/16 0805 09/25/16 0942  NA 140 140  K 3.5 3.6  CL 101 98*  CO2 29 30  GLUCOSE 60* 139*  BUN 66* 65*  CREATININE 4.57* 4.50*  CALCIUM 8.3* 8.3*    Dg Chest Port 1 View  Result Date: 09/24/2016 CLINICAL DATA:  Pleural effusion EXAM: PORTABLE CHEST 1 VIEW COMPARISON:  09/23/2016 CXR, 09/07/2016 CT FINDINGS: Interval decrease in large right effusion/along the periphery of the right hemithorax. Right basilar atelectasis is identified. Chronic elevation of the right hemidiaphragm. Heart is top-normal in size. Aortic atherosclerosis is noted. There is mild pulmonary vascular congestion. There is osteoarthritis about both AC and glenohumeral joints. IMPRESSION: Interval decrease in right effusion. Right basilar atelectasis. Aortic atherosclerosis. There is mild vascular congestion consistent with mild CHF. Electronically Signed   By: Ashley Royalty M.D.   On: 09/24/2016 18:06    Scheduled medications . benzonatate  100 mg Oral TID  . calcium acetate  667 mg Oral TID WC  . cholecalciferol  1,000 Units Oral  Daily  . cloNIDine  0.3 mg Oral TID  . diltiazem  240 mg Oral Daily  . feeding supplement (NEPRO CARB STEADY)  237 mL Oral BID BM  . furosemide  40 mg Oral BID  . insulin aspart  0-9 Units Subcutaneous TID AC & HS  . insulin aspart  5 Units Subcutaneous TID WC  . insulin glargine  40 Units Subcutaneous QHS  . ipratropium  0.5 mg Nebulization Q6H  . levalbuterol  1.25 mg Nebulization Q6H  . pantoprazole  40 mg Oral Daily  . saccharomyces boulardii  250 mg Oral BID  . senna-docusate  1 tablet Oral QHS  . sodium bicarbonate  650 mg Oral TID  . warfarin  2.5 mg Oral ONCE-1800  . Warfarin - Pharmacist Dosing Inpatient   Does not apply q1800     Background: 66 yo AAM with CKD 2/2 FSGS, baseline CKD 3, DM, HTNadmitted 1/4 with EColi bacteremia 2/2 acute cholecystitis, s/p perc cholecystostomy, RLL PNA, resp failure, new onset AFib. Developed AKI on CKD, required CRRT 1/10 for 48 hours transiently primarily for ultrafiltration, did not require HD, renal function spontaneously improving. Required thoracentesis 1/23 for R pleural effusion.   Assessment/ Plan:  1. Acute renal failure on chronic kidney disease stage POI:[id number] CKD from FSGS/diabetes and hypertension. Transiently on CRRT for pulmonary edema/worsening renal function and A. flutter with RVR. Creatinine plateaued, now slow downward. 4.5 today. Vol status better. Still w/ some interstitial edema on 1/23 CXR.  Has a left radiocephalic fistula (mature) suitable for use. Continue Lasix 40 PO BID.  No dialysis indications at this time.  Will need to arrange close followup with Dr. Florene Glen 2. RLL PNA/acute cholecystitis:  S/p broad-spectrum antibiotics and s/p percutaneous cholecystostomy-- will need cholecystectomy as an outpatient.   3. R pleural effusion - s/p thoracentesis of 800 cc bloody fluid by CCM 1/23. Looks exudative. No ATB's added yet but abundant WBC's on gm stain. For F/U CXR in the AM 4. S/p EColi bacteremia - finished ATB's  for this 5. Wheezing - CCM attributes to smoking/atelectasis/collapse. CCM added xopenex/atrovent 6. Volume overload: improved; continue fluid restriction and diuretics. Still w/ some interstitial edema on 1/23 CXR. 7. Hypertension: good. 8. Hyperphosphatemia: phoslo 1 tab TID with meals. Phos 4.1 9. Atrial flutter with rapid ventricular response. Metabolic acidosis: on sodium bicarb 1300 TID - can reduce to 650 TID (CO2 28) 10. Disposition - waiting on pleural fluid studies. Pulm wise would not think stable for discharge yet.   Jamal Maes, MD [facility] [phone] Pager 09/25/2016, 3:02 PM

## 2016-09-25 NOTE — Progress Notes (Signed)
PROGRESS NOTE  Gary Lang UDJ:[id number] DOB: 1951-04-11 DOA: 09/05/2016 PCP: [name], MD  Brief Summary:  Brief:   66 y.o. 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  He was admitted 09/05/16 with  E.Coli bacteremia due to acute cholecystitis. He was seen by [facility] and had percutaneous drain placed 01/09 as CCS felt he was not stable enough for full surgery. Since admission, he has had worsening in renal function and also developed new onset paroxysmal atrial fib. On 1/10 Due to worsening renal function started on CVVHD (prefered over HD due to HR).  SIGNIFICANT EVENTS: 1/04 > admit sepsis > e.coli bacteremia secondary to acute cholecystitis 1/07> Percutaneous drain placed by IR 1/10 > cvvhd, fib rvr  CULTURES: Blood 01/04 > E.Coli. Gallbladder fluid 01/07 > E.Coli, K.Pneumoniae.  ANTIBIOTICS: Azithro 01/04 > 01/10 Ceftriaxone 01/04 > 01/05, 1/11 >  Zosyn 01/04 >1/11  LINES/TUBES: RIJ Vascath 1/11 > both a temporary right IJ dialysis catheter and a left radiocephalic fistula (mature) suitable for use.  Patient was admitted to [facility], transferred to ICU to have CRRT Back to [facility] service on 1/14  HPI/Recap of past 24 hours:  Pt looking forward to going home soon.  Assessment/Plan: Active Problems:   Community acquired pneumonia of right lower lobe of lung ([place])   Acute respiratory failure with hypoxia ([place])   Acute cholecystitis due to biliary calculus   AKI (acute kidney injury) ([place])   Abdominal distention, non-gaseous   Acute abdominal pain   Fever   PAF (paroxysmal atrial fibrillation) (HCC)   Pleural effusion  #Sepsis due to E Coli bacteremia and possibly commonly acquired pneumonia/acute cholecystitis: Chest x-ray showed right lower lobe pneumonia. Patient with tachycardia, leukocytosis, fever and abnormal x-ray finding on admission -2 out of 2 blood cultures + Escherichia coli. Drain culture with   ESCHERICHIA COLI and KLEBSIELLA PNEUMONIAE --S/p perc cholecystostomy 09/08/16.per IR needsOP follow-up in IR clinic in 5-6 weeks -general surgery oked to start coumadin, patient is to follow up with general surgery in a few weeks for elective laparoscopic cholecystectomy  - sepsis resolving, taper off steroids, finished 10 days of iv abx, d/c abx on 1/14, repeat blood culture no growth  #Acute hypoxic respiratory failure likely in the setting of pneumonia and sepsis:  - Finished abx treatment on 1/14, Improved, on room air.  - Pulmonary consulted for pleural effusion. Pt currently undergoing work up by pulmonary team.  Blood loss anemia - reassess hgb levels - resolved with discontinuation of heparin and aspirin. Currently patient on coumadin.  #Acute on chronic kidney disease, history of FSGN , has AVF in place,   - Awaiting final nephrology recommendations  #Elevated troponin likely in the setting of sepsis/aki: Trend cardiac enzymes in the setting of new onset atrial fibrillation -Echocardiogram showed vigorous left ventricular systolic function with EF of 65-30%. There were no regional wall motion abnormalities. Also with  grade 1 diastolic dysfunction. Converted to sinus rhythm, currently on ccb Both general surgery and cardiology oked with transition from heparin drip to  Coumadin on 1/15 Cardiology signed off on 1/15, outpatient follow up recommended he is new on coumadin, coumadin teaching, patient needs to have outpatient coumadin level checked. - INR at goal   Uncontrolled diabetes, insulin dependent Required insulin drip initially Now on Lantus, sliding scale insulin,  Blood sugar elevated, increase insulin, taper steroids a1c 9.4  HTN; on ccb. clonidine  Physical deconditioning Prior to hospitalization, he  is independent with ADLs. PT eval, will need home health, drain care/PT if patient agrees to.  DVT prophylaxis: on coumadin Code Status: Full code Family  Communication: patient and wife Disposition Plan: Will await pulmonology evaluation and final recommendations   Consultants:   Nephrology  Cardiology  critical care  General surgery  IR  Pulmonology     Objective: BP (!) 131/51 (BP Location: Right Arm)   Pulse 92   Temp 98.6 F (37 C) (Oral)   Resp 17   Ht 6' 2.5" (1.892 m)   Wt 109.7 kg (241 lb 12.8 oz)   SpO2 95%   BMI 30.63 kg/m   Intake/Output Summary (Last 24 hours) at 09/25/16 1432 Last data filed at 09/25/16 1333  Gross per 24 hour  Intake              485 ml  Output             1825 ml  Net            -1340 ml   Filed Weights   09/19/16 2037 09/20/16 2030 09/21/16 2000  Weight: 111.6 kg (246 lb) 111.6 kg (246 lb 0.5 oz) 109.7 kg (241 lb 12.8 oz)    Exam:   General:  NAD, alert and awake  Cardiovascular: RRR, no rubs  Respiratory: CTABL, no wheezes, decreased breath sounds over right lung field.  Abdomen: Soft/ND/NT, positive BS,  perc cholecystostomy in place  Musculoskeletal: No Edema, no cyanosis  Neuro: aaox3  Data Reviewed: Basic Metabolic Panel:  Recent Labs Lab 09/19/16 0647 09/20/16 0746 09/21/16 0411 09/22/16 0418 09/22/16 1326 09/24/16 0518 09/25/16 0805 09/25/16 0942  NA 136 139 140  --  135 141 140 140  K 3.9 4.3 4.4  --  5.1 3.5 3.5 3.6  CL 100* 103 104  --  102 103 101 98*  CO2 '22 24 26  '$ --  '22 28 29 30  '$ GLUCOSE 59* 73 71  --  326* 61* 60* 139*  BUN 105* 97* 87*  --  86* 76* 66* 65*  CREATININE 6.22* 6.18* 6.30*  --  5.90* 5.08* 4.57* 4.50*  CALCIUM 7.9* 7.9* 8.1*  --  8.1* 8.5* 8.3* 8.3*  MG 2.4 2.4 2.3 2.5*  --   --   --   --   PHOS 7.3* 6.3* 6.4*  --  6.2* 4.8* 4.0 4.1   Liver Function Tests:  Recent Labs Lab 09/21/16 0411 09/22/16 1326 09/24/16 0518 09/25/16 0805 09/25/16 0942  PROT  --   --   --   --  7.3  ALBUMIN 1.6* 1.7* 1.9* 1.7* 1.7*   No results for input(s): LIPASE, AMYLASE in the last 168 hours. No results for input(s): AMMONIA in the  last 168 hours. CBC:  Recent Labs Lab 09/21/16 0411 09/22/16 0418 09/23/16 0631 09/24/16 0518 09/25/16 0805  WBC 10.8* 8.2 11.8* 11.9* 10.0  HGB 9.3* 8.8* 9.1* 9.4* 8.9*  HCT 29.4* 27.9* 29.6* 30.4* 28.7*  MCV 83.1 84.0 83.9 84.4 87.2  PLT 235 231 266 254 207   Cardiac Enzymes:   No results for input(s): CKTOTAL, CKMB, CKMBINDEX, TROPONINI in the last 168 hours. BNP (last 3 results)  Recent Labs  09/07/16 0719  BNP 101.0*    ProBNP (last 3 results) No results for input(s): PROBNP in the last 8760 hours.  CBG:  Recent Labs Lab 09/24/16 1758 09/24/16 2104 09/25/16 0806 09/25/16 0851 09/25/16 1130  GLUCAP 92 230* 60* 111* 200*  Recent Results (from the past 240 hour(s))  Body fluid culture     Status: None (Preliminary result)   Collection Time: 09/24/16  5:10 PM  Result Value Ref Range Status   Specimen Description FLUID RIGHT PLEURAL  Final   Special Requests NONE  Final   Gram Stain   Final    ABUNDANT WBC PRESENT,BOTH PMN AND MONONUCLEAR NO ORGANISMS SEEN    Culture NO GROWTH < 24 HOURS  Final   Report Status PENDING  Incomplete     Studies: Dg Chest Port 1 View  Result Date: 09/24/2016 CLINICAL DATA:  Pleural effusion EXAM: PORTABLE CHEST 1 VIEW COMPARISON:  09/23/2016 CXR, 09/07/2016 CT FINDINGS: Interval decrease in large right effusion/along the periphery of the right hemithorax. Right basilar atelectasis is identified. Chronic elevation of the right hemidiaphragm. Heart is top-normal in size. Aortic atherosclerosis is noted. There is mild pulmonary vascular congestion. There is osteoarthritis about both AC and glenohumeral joints. IMPRESSION: Interval decrease in right effusion. Right basilar atelectasis. Aortic atherosclerosis. There is mild vascular congestion consistent with mild CHF. Electronically Signed   By: Ashley Royalty M.D.   On: 09/24/2016 18:06    Scheduled Meds: . benzonatate  100 mg Oral TID  . calcium acetate  667 mg Oral TID WC  .  cholecalciferol  1,000 Units Oral Daily  . cloNIDine  0.3 mg Oral TID  . diltiazem  240 mg Oral Daily  . feeding supplement (NEPRO CARB STEADY)  237 mL Oral BID BM  . furosemide  40 mg Oral BID  . insulin aspart  0-9 Units Subcutaneous TID AC & HS  . insulin aspart  5 Units Subcutaneous TID WC  . insulin glargine  40 Units Subcutaneous QHS  . ipratropium  0.5 mg Nebulization Q6H  . levalbuterol  1.25 mg Nebulization Q6H  . pantoprazole  40 mg Oral Daily  . saccharomyces boulardii  250 mg Oral BID  . senna-docusate  1 tablet Oral QHS  . sodium bicarbonate  650 mg Oral TID  . warfarin  2.5 mg Oral ONCE-1800  . Warfarin - Pharmacist Dosing Inpatient   Does not apply q1800    Continuous Infusions:  Time spent: 35 mins  Velvet Bathe MD, PhD  [facility] Pager [phone] If 7PM-7AM, please contact night-coverage at [email or web address], password [facility] 09/25/2016, 2:32 PM  LOS: 20 days

## 2016-09-25 NOTE — Progress Notes (Signed)
ANTICOAGULATION CONSULT NOTE - Follow Up Consult  Pharmacy Consult for warfarin Indication: atrial fibrillation  Patient Measurements: Height: 6' 2.5" (189.2 cm) Weight: 241 lb 12.8 oz (109.7 kg) IBW/kg (Calculated) : 83.35   Labs:  Recent Labs  09/22/16 1326  09/23/16 0631 09/24/16 0518 09/25/16 0805  HGB  --   < > 9.1* 9.4* 8.9*  HCT  --   --  29.6* 30.4* 28.7*  PLT  --   --  266 254 207  LABPROT  --   --  35.5* 28.7* 27.3*  INR  --   --  3.45 2.64 2.48  CREATININE 5.90*  --   --  5.08* 4.57*  < > = values in this interval not displayed.  Estimated Creatinine Clearance: 21.1 mL/min (by C-G formula based on SCr of 4.57 mg/dL (H)).   Assessment: 79 YOM with new onset Afib with RVR.  The patient is s/p heparin drip. Oral anticoagulation was held pending possible surgical plans for cholecystitis. There is no immediate surgery planned so pharmacy was consulted to add new warfarin starting 1/15.   Hemoglobin and platelets remain stable. No over bleeding noted, though noted bloody pleural fluid on thoracentesis yesterday.  INR 2.48.  Goal of Therapy:  INR 2-3 Monitor platelets by anticoagulation protocol: Yes   Plan:  - Warfarin 2.'5mg'$  po tonight - Daily INR - Monitor for s/sx of bleeding, diet change, drug interactions - Unlikely for patient to discharge today- will leave recommendations again when patient is ready for discharge   Thank you for allowing pharmacy to be a part of this patient's care.  Ailynn Gow D. Synai Prettyman, PharmD, BCPS Clinical Pharmacist Pager: [phone] 09/25/2016 10:17 AM

## 2016-09-25 NOTE — Progress Notes (Addendum)
CBG 111 after intake of breakfast.  Paulla Fore, RN

## 2016-09-26 ENCOUNTER — Inpatient Hospital Stay (HOSPITAL_COMMUNITY): Payer: PPO

## 2016-09-26 LAB — PROTIME-INR
INR: 2.36
PROTHROMBIN TIME: 26.2 s — AB (ref 11.4–15.2)

## 2016-09-26 LAB — RENAL FUNCTION PANEL
ALBUMIN: 1.6 g/dL — AB (ref 3.5–5.0)
Anion gap: 10 (ref 5–15)
BUN: 60 mg/dL — AB (ref 6–20)
CHLORIDE: 100 mmol/L — AB (ref 101–111)
CO2: 30 mmol/L (ref 22–32)
Calcium: 8.2 mg/dL — ABNORMAL LOW (ref 8.9–10.3)
Creatinine, Ser: 4.25 mg/dL — ABNORMAL HIGH (ref 0.61–1.24)
GFR calc Af Amer: 15 mL/min — ABNORMAL LOW (ref >60)
GFR calc non Af Amer: 13 mL/min — ABNORMAL LOW (ref >60)
GLUCOSE: 91 mg/dL (ref 65–99)
PHOSPHORUS: 4.7 mg/dL — AB (ref 2.5–4.6)
Potassium: 3.6 mmol/L (ref 3.5–5.1)
Sodium: 140 mmol/L (ref 135–145)

## 2016-09-26 LAB — GLUCOSE, CAPILLARY
GLUCOSE-CAPILLARY: 71 mg/dL (ref 65–99)
Glucose-Capillary: 139 mg/dL — ABNORMAL HIGH (ref 65–99)
Glucose-Capillary: 196 mg/dL — ABNORMAL HIGH (ref 65–99)
Glucose-Capillary: 209 mg/dL — ABNORMAL HIGH (ref 65–99)
Glucose-Capillary: 164 mg/dL — ABNORMAL HIGH (ref 65–99)

## 2016-09-26 LAB — CBC
HEMATOCRIT: 26.6 % — AB (ref 39.0–52.0)
HEMOGLOBIN: 8.3 g/dL — AB (ref 13.0–17.0)
MCH: 27.7 pg (ref 26.0–34.0)
MCHC: 31.2 g/dL (ref 30.0–36.0)
MCV: 88.7 fL (ref 78.0–100.0)
PLATELETS: 194 10*3/uL (ref 150–400)
RBC: 3 MIL/uL — ABNORMAL LOW (ref 4.22–5.81)
RDW: 15.6 % — AB (ref 11.5–15.5)
WBC: 7.5 10*3/uL (ref 4.0–10.5)

## 2016-09-26 LAB — PH, BODY FLUID: PH, BODY FLUID: 7.9

## 2016-09-26 MED ORDER — LEVALBUTEROL HCL 0.63 MG/3ML IN NEBU: 0.6300 mg | INHALATION_SOLUTION | Freq: Two times a day (BID) | RESPIRATORY_TRACT | Status: DC

## 2016-09-26 MED ORDER — WARFARIN SODIUM 2.5 MG PO TABS
2.5000 mg | ORAL_TABLET | Freq: Once | ORAL | Status: AC
  Administered 2016-09-26: 2.5 mg via ORAL
  Filled 2016-09-26: qty 1

## 2016-09-26 MED ORDER — IPRATROPIUM BROMIDE 0.02 % IN SOLN
0.5000 mg | Freq: Two times a day (BID) | RESPIRATORY_TRACT | Status: DC
  Administered 2016-09-26 – 2016-09-28 (×4): 0.5 mg via RESPIRATORY_TRACT
  Filled 2016-09-26 (×4): qty 2.5

## 2016-09-26 MED ORDER — DARBEPOETIN ALFA 100 MCG/0.5ML IJ SOSY
100.0000 ug | PREFILLED_SYRINGE | INTRAMUSCULAR | Status: DC
  Administered 2016-09-26: 100 ug via SUBCUTANEOUS
  Filled 2016-09-26: qty 0.5

## 2016-09-26 NOTE — Progress Notes (Signed)
PROGRESS NOTE  Gary Lang YKD:[id number] DOB: 1950-11-14 DOA: 09/05/2016 PCP: [name], MD  Brief Summary:  Brief:   66 y.o. 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  He was admitted 09/05/16 with  E.Coli bacteremia due to acute cholecystitis. He was seen by [facility] and had percutaneous drain placed 01/09 as CCS felt he was not stable enough for full surgery. Since admission, he has had worsening in renal function and also developed new onset paroxysmal atrial fib. On 1/10 Due to worsening renal function started on CVVHD (prefered over HD due to HR).  Was gonna be discharged but patient developed SOB. X ray would show pleural effusion. Pulmonary consulted. Patient being evaluated by Pulomonary  SIGNIFICANT EVENTS: 1/04 > admit sepsis > e.coli bacteremia secondary to acute cholecystitis 1/07> Percutaneous drain placed by IR 1/10 > cvvhd, fib rvr  CULTURES: Blood 01/04 > E.Coli. Gallbladder fluid 01/07 > E.Coli, K.Pneumoniae.  ANTIBIOTICS: Azithro 01/04 > 01/10 Ceftriaxone 01/04 > 01/05, 1/11 >  Zosyn 01/04 >1/11  LINES/TUBES: RIJ Vascath 1/11 > both a temporary right IJ dialysis catheter and a left radiocephalic fistula (mature) suitable for use.  Patient was admitted to [facility], transferred to ICU to have CRRT Back to [facility] service on 1/14  HPI/Recap of past 24 hours:  No new complaints reported today.  Assessment/Plan: Active Problems:   Community acquired pneumonia of right lower lobe of lung ([place])   Acute respiratory failure with hypoxia ([place])   Acute cholecystitis due to biliary calculus   AKI (acute kidney injury) ([place])   Abdominal distention, non-gaseous   Acute abdominal pain   Fever   PAF (paroxysmal atrial fibrillation) (HCC)   Pleural effusion  #Sepsis due to E Coli bacteremia and possibly commonly acquired pneumonia/acute cholecystitis: Chest x-ray showed right lower lobe pneumonia. Patient with  tachycardia, leukocytosis, fever and abnormal x-ray finding on admission -2 out of 2 blood cultures + Escherichia coli. Drain culture with  ESCHERICHIA COLI and KLEBSIELLA PNEUMONIAE --S/p perc cholecystostomy 09/08/16.per IR needsOP follow-up in IR clinic in 5-6 weeks -general surgery oked to start coumadin, patient is to follow up with general surgery in a few weeks for elective laparoscopic cholecystectomy  - sepsis resolving, taper off steroids, finished 10 days of iv abx, d/c abx on 1/14, repeat blood culture no growth  #Acute hypoxic respiratory failure likely in the setting of pneumonia and sepsis:  - Finished abx treatment on 1/14, Improved, on room air.  - Pt with wheezes prior to discharge. Will consult pulmonologist to see if there are any more treatment options given reports of asymmetric pleural effusion   Blood loss anemia - reassess hgb levels - resolved with discontinuation of heparin and aspirin. Currently patient on coumadin.  #Acute on chronic kidney disease, history of FSGN , has AVF in place,   - Awaiting final nephrology recommendations  #Elevated troponin likely in the setting of sepsis/aki: Trend cardiac enzymes in the setting of new onset atrial fibrillation -Echocardiogram showed vigorous left ventricular systolic function with EF of 65-30%. There were no regional wall motion abnormalities. Also with  grade 1 diastolic dysfunction. Converted to sinus rhythm, currently on ccb Both general surgery and cardiology oked with transition from heparin drip to  Coumadin on 1/15 Cardiology signed off on 1/15, outpatient follow up recommended he is new on coumadin, coumadin teaching, patient needs to have outpatient coumadin level checked. - INR at goal today.  Uncontrolled diabetes, insulin dependent  Required insulin drip initially Now on Lantus, sliding scale insulin,  Blood sugar elevated, increase insulin, taper steroids a1c 9.4  HTN; on ccb.  clonidine  Physical deconditioning Prior to hospitalization, he is independent with ADLs. PT eval, will need home health, drain care/PT if patient agrees to.  DVT prophylaxis: on coumadin Code Status: Full code Family Communication: patient and wife Disposition Plan: Once cleared for d/c by pulmonary team. Awaiting Pleural fluid work up.   Consultants:   Nephrology  Cardiology  critical care  General surgery  IR  Pulmonology     Objective: BP (!) 121/49 (BP Location: Right Arm)   Pulse 83   Temp 98 F (36.7 C) (Oral)   Resp 16   Ht 6' 2.5" (1.892 m)   Wt 103 kg (227 lb)   SpO2 94%   BMI 28.76 kg/m   Intake/Output Summary (Last 24 hours) at 09/26/16 1559 Last data filed at 09/26/16 1537  Gross per 24 hour  Intake              975 ml  Output             1720 ml  Net             -745 ml   Filed Weights   09/20/16 2030 09/21/16 2000 09/25/16 2057  Weight: 111.6 kg (246 lb 0.5 oz) 109.7 kg (241 lb 12.8 oz) 103 kg (227 lb)    Exam:   General:  NAD, alert and awake  Cardiovascular: RRR, no rubs  Respiratory: CTABL, no wheezes, decreased breath sounds over right lung field.  Abdomen: Soft/ND/NT, positive BS,  perc cholecystostomy in place  Musculoskeletal: No Edema, no cyanosis  Neuro: aaox3  Data Reviewed: Basic Metabolic Panel:  Recent Labs Lab 09/20/16 0746 09/21/16 0411 09/22/16 0418 09/22/16 1326 09/24/16 0518 09/25/16 0805 09/25/16 0942 09/26/16 0533  NA 139 140  --  135 141 140 140 140  K 4.3 4.4  --  5.1 3.5 3.5 3.6 3.6  CL 103 104  --  102 103 101 98* 100*  CO2 24 26  --  '22 28 29 30 30  '$ GLUCOSE 73 71  --  326* 61* 60* 139* 91  BUN 97* 87*  --  86* 76* 66* 65* 60*  CREATININE 6.18* 6.30*  --  5.90* 5.08* 4.57* 4.50* 4.25*  CALCIUM 7.9* 8.1*  --  8.1* 8.5* 8.3* 8.3* 8.2*  MG 2.4 2.3 2.5*  --   --   --   --   --   PHOS 6.3* 6.4*  --  6.2* 4.8* 4.0 4.1 4.7*   Liver Function Tests:  Recent Labs Lab 09/22/16 1326  09/24/16 0518 09/25/16 0805 09/25/16 0942 09/26/16 0533  PROT  --   --   --  7.3  --   ALBUMIN 1.7* 1.9* 1.7* 1.7* 1.6*   No results for input(s): LIPASE, AMYLASE in the last 168 hours. No results for input(s): AMMONIA in the last 168 hours. CBC:  Recent Labs Lab 09/22/16 0418 09/23/16 0631 09/24/16 0518 09/25/16 0805 09/26/16 0533  WBC 8.2 11.8* 11.9* 10.0 7.5  HGB 8.8* 9.1* 9.4* 8.9* 8.3*  HCT 27.9* 29.6* 30.4* 28.7* 26.6*  MCV 84.0 83.9 84.4 87.2 88.7  PLT 231 266 254 207 194   Cardiac Enzymes:   No results for input(s): CKTOTAL, CKMB, CKMBINDEX, TROPONINI in the last 168 hours. BNP (last 3 results)  Recent Labs  09/07/16 0719  BNP 101.0*    ProBNP (last  3 results) No results for input(s): PROBNP in the last 8760 hours.  CBG:  Recent Labs Lab 09/25/16 1811 09/25/16 2055 09/26/16 0419 09/26/16 0805 09/26/16 1156  GLUCAP 293* 277* 71 139* 196*    Recent Results (from the past 240 hour(s))  Body fluid culture     Status: None (Preliminary result)   Collection Time: 09/24/16  5:10 PM  Result Value Ref Range Status   Specimen Description FLUID RIGHT PLEURAL  Final   Special Requests NONE  Final   Gram Stain   Final    ABUNDANT WBC PRESENT,BOTH PMN AND MONONUCLEAR NO ORGANISMS SEEN    Culture NO GROWTH 2 DAYS  Final   Report Status PENDING  Incomplete     Studies: Dg Chest Port 1 View  Result Date: 09/26/2016 CLINICAL DATA:  Pleural effusion EXAM: PORTABLE CHEST 1 VIEW COMPARISON:  Two days ago FINDINGS: Right pleural effusion that is moderate to large, increased from prior. Partly visible drain at the lower chest on the right is a cholecystostomy tube based on January 2018 abdominal CT. Normal heart size. Comparatively clear left lung. No pneumothorax. IMPRESSION: Moderate to large right pleural effusion. Fluid volume is mildly increased since 2 days ago. Electronically Signed   By: Monte Fantasia M.D.   On: 09/26/2016 07:59    Scheduled Meds: .  benzonatate  100 mg Oral TID  . calcium acetate  667 mg Oral TID WC  . cholecalciferol  1,000 Units Oral Daily  . cloNIDine  0.3 mg Oral TID  . darbepoetin (ARANESP) injection - NON-DIALYSIS  100 mcg Subcutaneous Q Thu-1800  . diltiazem  240 mg Oral Daily  . feeding supplement (NEPRO CARB STEADY)  237 mL Oral BID BM  . furosemide  40 mg Oral BID  . insulin aspart  0-9 Units Subcutaneous TID AC & HS  . insulin glargine  40 Units Subcutaneous QHS  . ipratropium  0.5 mg Nebulization BID  . levalbuterol  1.25 mg Nebulization TID  . pantoprazole  40 mg Oral Daily  . saccharomyces boulardii  250 mg Oral BID  . senna-docusate  1 tablet Oral QHS  . sodium bicarbonate  650 mg Oral TID  . warfarin  2.5 mg Oral ONCE-1800  . Warfarin - Pharmacist Dosing Inpatient   Does not apply q1800    Continuous Infusions:  Time spent: 35 mins  Velvet Bathe MD, PhD  [facility] Pager [phone] If 7PM-7AM, please contact night-coverage at [email or web address], password [facility] 09/26/2016, 3:59 PM  LOS: 21 days

## 2016-09-26 NOTE — Progress Notes (Signed)
[facility] Rounding Note Subjective:  Says he feels a lot better Still DOE but wheezing has improved No CP CXR done - looks like increase in pleural fluid Waiting on recs from pulmonary  Objective: I/O last 3 completed shifts: In: 1100 [P.O.:840; Other:260] Out: 2370 [Urine:1875; Drains:495] No intake/output data recorded.   Physical Examination Very nice AAM Wife in room Much more comfortable appearing today, no increased WOB at rest Wearing nasal cannula O2 No JVD L side clear. Breath sounds very reduced at R base Abd soft, perc chole tube in place, brown fluid  Minimal (trace) edema LE's L FA AVF + bruit and thrill   Recent Labs  09/25/16 0805 09/26/16 0533  WBC 10.0 7.5  HGB 8.9* 8.3*  HCT 28.7* 26.6*  PLT 207 194    Recent Labs  09/25/16 0942 09/26/16 0533  NA 140 140  K 3.6 3.6  CL 98* 100*  CO2 30 30  GLUCOSE 139* 91  BUN 65* 60*  CREATININE 4.50* 4.25*  CALCIUM 8.3* 8.2*    Dg Chest Port 1 View  Result Date: 09/26/2016 CLINICAL DATA:  Pleural effusion EXAM: PORTABLE CHEST 1 VIEW COMPARISON:  Two days ago FINDINGS: Right pleural effusion that is moderate to large, increased from prior. Partly visible drain at the lower chest on the right is a cholecystostomy tube based on January 2018 abdominal CT. Normal heart size. Comparatively clear left lung. No pneumothorax. IMPRESSION: Moderate to large right pleural effusion. Fluid volume is mildly increased since 2 days ago. Electronically Signed   By: Monte Fantasia M.D.   On: 09/26/2016 07:59    Scheduled medications . benzonatate  100 mg Oral TID  . calcium acetate  667 mg Oral TID WC  . cholecalciferol  1,000 Units Oral Daily  . cloNIDine  0.3 mg Oral TID  . diltiazem  240 mg Oral Daily  . feeding supplement (NEPRO CARB STEADY)  237 mL Oral BID BM  . furosemide  40 mg Oral BID  . insulin aspart  0-9 Units Subcutaneous TID AC & HS  . insulin glargine  40 Units Subcutaneous QHS  . ipratropium  0.5 mg  Nebulization TID  . levalbuterol  1.25 mg Nebulization TID  . pantoprazole  40 mg Oral Daily  . saccharomyces boulardii  250 mg Oral BID  . senna-docusate  1 tablet Oral QHS  . sodium bicarbonate  650 mg Oral TID  . Warfarin - Pharmacist Dosing Inpatient   Does not apply q1800     Background: 66 yo AAM with CKD 2/2 FSGS, baseline CKD 3, DM, HTNadmitted 1/4 with EColi bacteremia 2/2 acute cholecystitis, s/p perc cholecystostomy, RLL PNA, resp failure, new onset AFib. Developed AKI on CKD, required CRRT 1/10 for 48 hours transiently primarily for ultrafiltration, did not require HD, renal function spontaneously improving. Required thoracentesis 1/23 for R pleural effusion. Baseline creatinine prior to current admission 2.67.   Assessment/ Plan:    1. AKI on CKD 3/4 (CKD 2/2 FSGS/DM/HTN) III: Transiently on CRRT for AKI (66) w/ pulmonary edema/ A. flutter with RVR. Creatinine plateaued, now slow downward - 4.25 today. Vol status stable. L lung clear on CXR today.   Has left RC AV fistula (mature) if needed. Continue Lasix 40 PO BID for now (Home dose was 40 QD).  No dialysis indications at this time.  Will need to arrange close followup with Dr. Florene Glen 2. RLL PNA/acute cholecystitis:  S/p broad-spectrum antibiotics and s/p percutaneous cholecystostomy-- will need cholecystectomy as an outpatient.  3. R pleural effusion - s/p thoracentesis of 800 cc bloody fluid by pulm 1/23. Looks exudative. No ATB's added yet but abundant WBC's on gm stain. Culture no growth so far. Pleural effusion looks larger today on CXR. Waiting on additional recs from pulmonary.  4. S/p EColi bacteremia - finished ATB's for this 5. Anemia - slow decline Hb in the hospital. Check Fe studies and dose Aranesp 100/week for now 6. Wheezing - CCM attributes to smoking/atelectasis/collapse. Pulm  added xopenex/atrovent 7. Hypertension: good. 8. Hyperphosphatemia: phoslo 1 tab TID with meals. Phos 4.1 9. Atrial flutter with  rapid ventricular response. Dilt.  10. Metabolic acidosis: on sodium bicarb 1300 TID - reduced to 650 TID (CO2 28) on 1/24   Jamal Maes, MD [facility] [phone] Pager 09/26/2016, 9:51 AM

## 2016-09-26 NOTE — Progress Notes (Signed)
Inpatient Diabetes Program Recommendations  AACE/ADA: New Consensus Statement on Inpatient Glycemic Control (2015)  Target Ranges:  Prepandial:   less than 140 mg/dL      Peak postprandial:   less than 180 mg/dL (1-2 hours)      Critically ill patients:  140 - 180 mg/dL   Results for Gary Lang, Gary Lang (MRN [id number]) as of 09/26/2016 10:43  Ref. Range 09/25/2016 08:06 09/25/2016 08:51 09/25/2016 11:30 09/25/2016 18:11 09/25/2016 20:55  Glucose-Capillary Latest Ref Range: 65 - 99 mg/dL 60 (L) 111 (H) 200 (H) 293 (H) 277 (H)   Results for Gary Lang, Gary Lang (MRN [id number]) as of 09/26/2016 10:43  Ref. Range 09/26/2016 04:19 09/26/2016 08:05  Glucose-Capillary Latest Ref Range: 65 - 99 mg/dL 71 139 (H)    Home DM Meds: Lantus 40 units QAM       Novolog 18 units TID       Trulicity 1.5 mg Qweek  Current Insulin Orders: Lantus 40 units QHS      Novolog Sensitive Correction Scale/ SSI (0-9 units) TID AC + HS      MD- Please consider the following in-hospital insulin adjustments:  1. Reduce Lantus to 35 units QHS  2. Restart Novolog Meal Coverage: Novolog 4 units TID with meals (hold if pt eats <50% of meal)     --Will follow patient during hospitalization--  Wyn Quaker RN, MSN, CDE Diabetes Coordinator Inpatient Glycemic Control Team Team Pager: [phone] (8a-5p)

## 2016-09-26 NOTE — Progress Notes (Addendum)
Name: Gary Lang MRN: [id number] DOB: [birth year]    ADMISSION DATE:  09/05/2016 CONSULTATION DATE:  09/24/16  REFERRING MD :  Dr. Wendee Lang   CHIEF COMPLAINT:  SOB, Pleural Effusion    BRIEF SUMMARY:  66 y/o M admitted 1/4 with RUQ abdominal pain, decreased appetite, fever and SOB.  Evaluation found him to have sepsis secondary to E-Coli bacteremia in the setting of acute cholecystitis.  On admit, there wer also concerns for possible RLL PNA/CAP.  The patient was evaluated by CCS but felt not stable enough for surgery.  A percutaneous cholecystostomy drain was placed on 09/08/16 by IR.  Hospital course complicated by new onset paroxysmal atrial fibrillation.  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  During hospitalization, he had worsening renal failure in the setting of sepsis and required CVVHD.  Blood cultures grew E-Coli and gallbladder fluid grew E-Coli and K. Pneumoniae.  He was treated with IV antibiotics (rocephin / azithro / zosyn).  Initial CT of the abdomen showed fluid collection near the gallbladder.  The patient later developed increased SOB, cough and wheezing.  CXR noted increased pleural effusion.  PCCM called back 1/23 for evaluation.    SUBJECTIVE:  No fevers., feels improved with breathing  VITAL SIGNS: Temp:  [98 F (36.7 C)-98.7 F (37.1 C)] 98 F (36.7 C) (01/25 0937) Pulse Rate:  [77-83] 83 (01/25 0937) Resp:  [16-18] 16 (01/25 0937) BP: (110-121)/(49-58) 121/49 (01/25 0937) SpO2:  [94 %-98 %] 94 % (01/25 1508) Weight:  [103 kg (227 lb)] 103 kg (227 lb) (01/24 2057)  PHYSICAL EXAMINATION: General: well developed adult male , no distress HEENT: jvd about the same Neuro: AAOx4, speech clear, MAE CV: s1s2 rrr, no m/r/g PULM: non-labored, lungs bilaterally diminished RLL without crackles,  Or ronchi  ZD:GLOV, non-tender, bsx4 active  Extremities: warm/dry, no edema.  LUE AVF with + thrill/bruit Skin: no  rashes or lesions    Recent Labs Lab 09/25/16 0805 09/25/16 0942 09/26/16 0533  NA 140 140 140  K 3.5 3.6 3.6  CL 101 98* 100*  CO2 '29 30 30  '$ BUN 66* 65* 60*  CREATININE 4.57* 4.50* 4.25*  GLUCOSE 60* 139* 91     Recent Labs Lab 09/24/16 0518 09/25/16 0805 09/26/16 0533  HGB 9.4* 8.9* 8.3*  HCT 30.4* 28.7* 26.6*  WBC 11.9* 10.0 7.5  PLT 254 207 194    Dg Chest Port 1 View  Result Date: 09/26/2016 CLINICAL DATA:  Pleural effusion EXAM: PORTABLE CHEST 1 VIEW COMPARISON:  Two days ago FINDINGS: Right pleural effusion that is moderate to large, increased from prior. Partly visible drain at the lower chest on the right is a cholecystostomy tube based on January 2018 abdominal CT. Normal heart size. Comparatively clear left lung. No pneumothorax. IMPRESSION: Moderate to large right pleural effusion. Fluid volume is mildly increased since 2 days ago. Electronically Signed   By: Gary Lang M.D.   On: 09/26/2016 07:59      SIGNIFICANT EVENTS  1/04  Admit with RUQ abd pain 1/07  Percutaneous cholecystostomy tube placement  1/11  To ICU, R IJ HD cath placed for CVVHD 1/14  To [facility]  1/23  PCCM called back for pleural effusion 1/23  Thora with 800 ml bloody fluid drained  STUDIES:  1/06  CT Chest w/o >> multifocal atelectasis of RML, RLL, LLL, trace L effusion 1/06  CT Abd/Pelvis >> extensive pericholecystic fluid, small non-calcified gallstone  or sludge near the neck of the gallbladder, inflammatory changes around the gallbladder > concern for acute cholecystitis 1/10  CT Abd/Pelvis >> interval placement of cholecystostomy catheter with gallbladder decompression, subcapsular fluid collection decreased, increased moderate R pleural effusion with R lower consolidation   ASSESSMENT / PLAN:  Discussion:  66 y/o M admitted with sepsis/bacteremia secondary to acute cholecystitis s/p percutaneous cholecystostomy drain placement.  Course complicated by PAF, elevated troponin,  acute on chronic CKD with known hx of FSGS.  PCCM called back 1/23 for assessment of wheezing / increased right pleural effusion.    1.  Exudative Right Pleural Effusion - likely multifactorial in the setting of recent e-coli bacteremia, possible CAP and sympathetic effusion from acute cholecystitis.  Rule out empyema / complicated pleural space, hemothorax on coumadin.   Plan: -  awake, reports improved breathing, reduced BS but nonew ronchi, no fevers, does NOT look septic or toxic, pcxr appears worse but not sure if ATX ir rising effusion or layering effusion causing this, would follow pcxr further in am , if continues to remain same appearance would CT chest and then decide if needs repeat thora (favor) vs chest tube placement, follow cytology and culture closely from prior thora, I see his urine outptu is high, would love tomaintain this negative balance with findings on pcxr- per renal, will follow in am     2.  Wheezing - prior hx of smoking improved  Plan: - add xopenex / atrovent Q6  - PRN xopenex   3.  E-Coli Bacteremia - resolving, completed 10 day course abx   Plan: - per primary  4.  Coagulopathy   Plan: - with rising pcxr effusion, avoid super therapeutic  Gary Lang. Gary Mould, MD, [place] Pgr: [facility] 09/26/2016 5:54 PM

## 2016-09-26 NOTE — Progress Notes (Signed)
ANTICOAGULATION CONSULT NOTE - Follow Up Consult  Pharmacy Consult for warfarin Indication: atrial fibrillation  Patient Measurements: Height: 6' 2.5" (189.2 cm) Weight: 227 lb (103 kg) IBW/kg (Calculated) : 83.35   Labs:  Recent Labs  09/24/16 0518 09/25/16 0805 09/25/16 0942 09/26/16 0533  HGB 9.4* 8.9*  --  8.3*  HCT 30.4* 28.7*  --  26.6*  PLT 254 207  --  194  LABPROT 28.7* 27.3*  --  26.2*  INR 2.64 2.48  --  2.36  CREATININE 5.08* 4.57* 4.50* 4.25*    Estimated Creatinine Clearance: 22.1 mL/min (by C-G formula based on SCr of 4.25 mg/dL (H)).   Assessment: 72 YOM with new onset Afib with RVR.  The patient is s/p heparin drip. Oral anticoagulation was held pending possible surgical plans for cholecystitis. There is no immediate surgery planned so pharmacy was consulted to add new warfarin starting 1/15.   INR today remains therapeutic (INR 2.36 << 2.48, goal of 2-3). Hgb/Hct slight drop, plts wnl. No overt s/sx of bleeding noted.   Goal of Therapy:  INR 2-3 Monitor platelets by anticoagulation protocol: Yes   Plan:  1. Repeat Warfarin 2.5 mg x 1 dose at 1800 today 2. Will continue to monitor for any signs/symptoms of bleeding and will follow up with PT/INR in the a.m.   Thank you for allowing pharmacy to be a part of this patient's care.  Alycia Rossetti, PharmD, BCPS Clinical Pharmacist Pager: [phone] Clinical phone for 09/26/2016 from 7a-3:30p: [phone] If after 3:30p, please call main pharmacy at: [phone] 09/26/2016 11:26 AM

## 2016-09-27 ENCOUNTER — Inpatient Hospital Stay (HOSPITAL_COMMUNITY): Payer: PPO

## 2016-09-27 DIAGNOSIS — N179 Acute kidney failure, unspecified: Secondary | ICD-10-CM

## 2016-09-27 DIAGNOSIS — E119 Type 2 diabetes mellitus without complications: Secondary | ICD-10-CM

## 2016-09-27 DIAGNOSIS — K810 Acute cholecystitis: Secondary | ICD-10-CM

## 2016-09-27 LAB — GLUCOSE, CAPILLARY
GLUCOSE-CAPILLARY: 123 mg/dL — AB (ref 65–99)
GLUCOSE-CAPILLARY: 227 mg/dL — AB (ref 65–99)
GLUCOSE-CAPILLARY: 283 mg/dL — AB (ref 65–99)
Glucose-Capillary: 226 mg/dL — ABNORMAL HIGH (ref 65–99)

## 2016-09-27 LAB — RENAL FUNCTION PANEL
ALBUMIN: 1.7 g/dL — AB (ref 3.5–5.0)
Anion gap: 9 (ref 5–15)
BUN: 54 mg/dL — AB (ref 6–20)
CO2: 30 mmol/L (ref 22–32)
Calcium: 8.1 mg/dL — ABNORMAL LOW (ref 8.9–10.3)
Chloride: 100 mmol/L — ABNORMAL LOW (ref 101–111)
Creatinine, Ser: 4.04 mg/dL — ABNORMAL HIGH (ref 0.61–1.24)
GFR calc Af Amer: 16 mL/min — ABNORMAL LOW (ref >60)
GFR calc non Af Amer: 14 mL/min — ABNORMAL LOW (ref >60)
GLUCOSE: 149 mg/dL — AB (ref 65–99)
PHOSPHORUS: 4 mg/dL (ref 2.5–4.6)
POTASSIUM: 3.7 mmol/L (ref 3.5–5.1)
SODIUM: 139 mmol/L (ref 135–145)

## 2016-09-27 LAB — CBC
HCT: 25.9 % — ABNORMAL LOW (ref 39.0–52.0)
Hemoglobin: 8.4 g/dL — ABNORMAL LOW (ref 13.0–17.0)
MCH: 29.8 pg (ref 26.0–34.0)
MCHC: 32.4 g/dL (ref 30.0–36.0)
MCV: 91.8 fL (ref 78.0–100.0)
PLATELETS: 189 10*3/uL (ref 150–400)
RBC: 2.82 MIL/uL — AB (ref 4.22–5.81)
RDW: 16 % — ABNORMAL HIGH (ref 11.5–15.5)
WBC: 6 10*3/uL (ref 4.0–10.5)

## 2016-09-27 LAB — IRON AND TIBC
Iron: 21 ug/dL — ABNORMAL LOW (ref 45–182)
SATURATION RATIOS: 9 % — AB (ref 17.9–39.5)
TIBC: 235 ug/dL — ABNORMAL LOW (ref 250–450)
UIBC: 214 ug/dL

## 2016-09-27 LAB — PROTIME-INR
INR: 2.07
PROTHROMBIN TIME: 23.6 s — AB (ref 11.4–15.2)

## 2016-09-27 LAB — PROCALCITONIN: Procalcitonin: 0.93 ng/mL

## 2016-09-27 MED ORDER — SODIUM CHLORIDE 0.9 % IV SOLN
510.0000 mg | Freq: Once | INTRAVENOUS | Status: AC
  Administered 2016-09-27: 510 mg via INTRAVENOUS
  Filled 2016-09-27: qty 17

## 2016-09-27 MED ORDER — WARFARIN SODIUM 5 MG PO TABS
5.0000 mg | ORAL_TABLET | Freq: Once | ORAL | Status: AC
  Administered 2016-09-27: 5 mg via ORAL
  Filled 2016-09-27: qty 1

## 2016-09-27 NOTE — Progress Notes (Signed)
Nutrition Follow-up  DOCUMENTATION CODES:   Obesity unspecified  INTERVENTION:  Continue Nepro Shake po BID, each supplement provides 425 kcal and 19 grams protein.  Encourage adequate PO intake.   NUTRITION DIAGNOSIS:   Inadequate oral intake related to poor appetite as evidenced by per patient/family report; improved  GOAL:   Patient will meet greater than or equal to 90% of their needs; met  MONITOR:   PO intake, Supplement acceptance, Weight trends, Skin, I & O's, Labs  REASON FOR ASSESSMENT:   Malnutrition Screening Tool    ASSESSMENT:   66 YO male with CKD Stage III from GSGS/diabetes and hypertension. Pt admitted with community acquired pneumonia of rt lower lobe of lung. Patient started on CVVHD due to worsening renal function on 1/10 but has since discontinued.  Meal completion has been varied from 15-100%. Pt was unavailable in CT during time of attempted visit. Pt currently has Nepro Shake ordered and has been consuming them. RD to continue with current orders to aid in adequate caloric and protein needs. Labs and medications reviewed.   Diet Order:  Diet Carb Modified Fluid consistency: Thin; Room service appropriate? Yes Diet - low sodium heart healthy  Skin:  Wound (see comment) (Stage 2 to buttocks)  Last BM:  1/26  Height:   Ht Readings from Last 1 Encounters:  09/18/16 6' 2.5" (1.892 m)    Weight:   Wt Readings from Last 1 Encounters:  09/25/16 227 lb (103 kg)    Ideal Body Weight:  86.36 kg  BMI:  Body mass index is 28.76 kg/m.  Estimated Nutritional Needs:   Kcal:  1900-2100  Protein:  110-130 grams (1-1.2g/kg)  Fluid:  Per MD  EDUCATION NEEDS:   No education needs identified at this time  Corrin Parker, MS, RD, LDN Pager # [phone] After hours/ weekend pager # [phone]

## 2016-09-27 NOTE — Care Management Important Message (Signed)
Important Message  Patient Details  Name: Gary Lang MRN: [id number] Date of Birth: 10-Jul-1951   Medicare Important Message Given:  Yes    Nathen May 09/27/2016, 2:27 PM

## 2016-09-27 NOTE — Progress Notes (Signed)
Name: Gary Lang MRN: [id number] DOB: [birth year]    ADMISSION DATE:  09/05/2016 CONSULTATION DATE:  09/24/16  REFERRING MD :  Dr. Wendee Beavers   CHIEF COMPLAINT:  SOB, Pleural Effusion    BRIEF SUMMARY:  66 y/o M admitted 1/4 with RUQ abdominal pain, decreased appetite, fever and SOB.  Evaluation found him to have sepsis secondary to E-Coli bacteremia in the setting of acute cholecystitis.  On admit, there wer also concerns for possible RLL PNA/CAP.  The patient was evaluated by CCS but felt not stable enough for surgery.  A percutaneous cholecystostomy drain was placed on 09/08/16 by IR.  Hospital course complicated by new onset paroxysmal atrial fibrillation.  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  During hospitalization, he had worsening renal failure in the setting of sepsis and required CVVHD.  Blood cultures grew E-Coli and gallbladder fluid grew E-Coli and K. Pneumoniae.  He was treated with IV antibiotics (rocephin / azithro / zosyn).  Initial CT of the abdomen showed fluid collection near the gallbladder.  The patient later developed increased SOB, cough and wheezing.  CXR noted increased pleural effusion.  PCCM called back 1/23 for evaluation.    SUBJECTIVE:   Pt "bummed" he can't go home but doesn't want to leave until his effusion is sorted out.  Denies acute complaints.  Coughing up blood tinged mucus.   VITAL SIGNS: Temp:  [98 F (36.7 C)-98.6 F (37 C)] 98.4 F (36.9 C) (01/26 0910) Pulse Rate:  [84-91] 84 (01/26 0910) Resp:  [17-18] 17 (01/26 0910) BP: (110-136)/(47-57) 126/47 (01/26 0910) SpO2:  [94 %-99 %] 99 % (01/26 0910)  PHYSICAL EXAMINATION: General: adult male in NAD lying in bed HEENT: MM pink/moist Neuro: AAOx4, speech clear, MAE CV: s1s2 rrr, no m/r/g PULM: non-labored, diminished on R, clear on L WU:JWJX, non-tender, bsx4 active  Extremities: warm/dry, no edema  Skin: no rashes or lesions    Recent  Labs Lab 09/25/16 0942 09/26/16 0533 09/27/16 0556  NA 140 140 139  K 3.6 3.6 3.7  CL 98* 100* 100*  CO2 '30 30 30  '$ BUN 65* 60* 54*  CREATININE 4.50* 4.25* 4.04*  GLUCOSE 139* 91 149*     Recent Labs Lab 09/25/16 0805 09/26/16 0533 09/27/16 0556  HGB 8.9* 8.3* 8.4*  HCT 28.7* 26.6* 25.9*  WBC 10.0 7.5 6.0  PLT 207 194 189    Dg Chest Port 1 View  Result Date: 09/26/2016 CLINICAL DATA:  Pleural effusion EXAM: PORTABLE CHEST 1 VIEW COMPARISON:  Two days ago FINDINGS: Right pleural effusion that is moderate to large, increased from prior. Partly visible drain at the lower chest on the right is a cholecystostomy tube based on January 2018 abdominal CT. Normal heart size. Comparatively clear left lung. No pneumothorax. IMPRESSION: Moderate to large right pleural effusion. Fluid volume is mildly increased since 2 days ago. Electronically Signed   By: Monte Fantasia M.D.   On: 09/26/2016 07:59    SIGNIFICANT EVENTS  1/04  Admit with RUQ abd pain 1/07  Percutaneous cholecystostomy tube placement  1/11  To ICU, R IJ HD cath placed for CVVHD 1/14  To [facility]  1/23  PCCM called back for pleural effusion 1/23  Thora with 800 ml bloody fluid drained  STUDIES:  1/06  CT Chest w/o >> multifocal atelectasis of RML, RLL, LLL, trace L effusion 1/06  CT Abd/Pelvis >> extensive pericholecystic fluid, small non-calcified gallstone or sludge  near the neck of the gallbladder, inflammatory changes around the gallbladder > concern for acute cholecystitis 1/10  CT Abd/Pelvis >> interval placement of cholecystostomy catheter with gallbladder decompression, subcapsular fluid collection decreased, increased moderate R pleural effusion with R lower consolidation   ASSESSMENT / PLAN:  Discussion:  66 y/o M admitted with sepsis/bacteremia secondary to acute cholecystitis s/p percutaneous cholecystostomy drain placement.  Course complicated by PAF, elevated troponin, acute on chronic CKD with known hx of  FSGS.  PCCM called back 1/23 for assessment of wheezing / increased right pleural effusion.    1.  Exudative Right Pleural Effusion - likely multifactorial in the setting of recent e-coli bacteremia, possible CAP and sympathetic effusion from acute cholecystitis.  Rule out empyema / complicated pleural space, hemothorax on coumadin.   Plan: - repeat CT chest 1/26 am to evaluate effusion / pleural space - may need repeat thora vs chest tube for drainage - follow cultures from first thora - intermittent CXR - pulmonary hygiene   2.  Wheezing - prior hx of smoking improved  Plan: - xopenex + atrovent Q6 - PRN xopenex   3.  E-Coli Bacteremia - resolving, completed 10 day course abx   Plan: - per primary  4.  Coagulopathy   Plan: - avoid supratherapeutic INR with bloody effusion   [name], NP-C [facility] Pgr: [phone] or if no answer [phone] 09/27/2016, 10:31 AM

## 2016-09-27 NOTE — Progress Notes (Signed)
[facility] Rounding Note Subjective:  Waiting on CT results Eating lunch Says feels a "lot better"  Objective: I/O last 3 completed shifts: In: 1922 [P.O.:1422; Other:500] Out: 2450 [Urine:2025; Drains:375; Other:50] Total I/O In: 180 [P.O.:180] Out: 500 [Urine:500]   Physical Examination Very nice AAM NAD Wearing nasal cannula O2 In the chair, eating lunch No JVD L side clear. Breath sounds very reduced at R base Abd soft, perc chole tube in place, brown fluid  No LE edema L FA AVF + bruit and thrill   Recent Labs  09/26/16 0533 09/27/16 0556  WBC 7.5 6.0  HGB 8.3* 8.4*  HCT 26.6* 25.9*  PLT 194 189    Recent Labs  09/26/16 0533 09/27/16 0556  NA 140 139  K 3.6 3.7  CL 100* 100*  CO2 30 30  GLUCOSE 91 149*  BUN 60* 54*  CREATININE 4.25* 4.04*  CALCIUM 8.2* 8.1*   Iron/TIBC/Ferritin/ %Sat    Component Value Date/Time   IRON 21 (L) 09/27/2016 0556   TIBC 235 (L) 09/27/2016 0556   IRONPCTSAT 9 (L) 09/27/2016 0556   Ct Chest Wo Contrast  Result Date: 09/27/2016 CLINICAL DATA:  Pt admitted 09/05/16 for PNA; pt has since developed pleural effusions; pt states that he "just had the fluid removed, and that it re-accumulated within 48 hours"; pt denies any chest pain EXAM: CT CHEST WITHOUT CONTRAST TECHNIQUE: Multidetector CT imaging of the chest was performed following the standard protocol without IV contrast. COMPARISON:  Chest radiograph, 09/26/2016.  Chest CT, 09/07/2016. FINDINGS: Cardiovascular: Heart is normal in size. Great vessels are normal in caliber. No significant aortic atherosclerotic plaque. No significant coronary artery calcifications. Mediastinum/Nodes: No neck base or axillary masses or enlarged lymph nodes. Thyroid gland is unremarkable. No mediastinal masses or enlarged lymph nodes. No left hilar masses or adenopathy. Right hilum is partly obscured by contiguous lung opacity. Lungs/Pleura: Moderate right pleural effusion. There is atelectasis of the  right lower lobe and partial atelectasis of the dependent right middle lobe. Some dependent secretions are noted in the bronchus intermedius and right lower lobe bronchi. There is mild subsegmental atelectasis in the dependent right upper lobe. Mild peribronchovascular linear and reticular opacities are noted in the left lower lobe there also likely atelectasis. No left pleural effusion. No pulmonary edema. No pneumothorax. Upper Abdomen: Trace amount of ascites adjacent to the liver. Otherwise unremarkable Musculoskeletal: No fracture or acute finding. No osteoblastic or osteolytic lesions. IMPRESSION: 1. Moderate right pleural effusion with complete atelectasis of the right lower lobe and partial atelectasis of the right middle lobe. Mild dependent subsegmental atelectasis noted in the dependent right upper lobe. No centrally obstructing mass is seen. Mucous/secretions are noted in the bronchus intermedius and right lower lobe segmental bronchi. 2. Mild subsegmental atelectasis in the left lower lobe. 3. No convincing pneumonia.  No pulmonary edema. Electronically Signed   By: Lajean Manes M.D.   On: 09/27/2016 11:58    Scheduled medications . benzonatate  100 mg Oral TID  . calcium acetate  667 mg Oral TID WC  . cholecalciferol  1,000 Units Oral Daily  . cloNIDine  0.3 mg Oral TID  . darbepoetin (ARANESP) injection - NON-DIALYSIS  100 mcg Subcutaneous Q Thu-1800  . diltiazem  240 mg Oral Daily  . feeding supplement (NEPRO CARB STEADY)  237 mL Oral BID BM  . furosemide  40 mg Oral BID  . insulin aspart  0-9 Units Subcutaneous TID AC & HS  . insulin glargine  40  Units Subcutaneous QHS  . ipratropium  0.5 mg Nebulization BID  . levalbuterol  1.25 mg Nebulization TID  . pantoprazole  40 mg Oral Daily  . saccharomyces boulardii  250 mg Oral BID  . senna-docusate  1 tablet Oral QHS  . sodium bicarbonate  650 mg Oral TID  . warfarin  5 mg Oral ONCE-1800  . Warfarin - Pharmacist Dosing Inpatient    Does not apply q1800     Background:  66 yo AAM with CKD 2/2 FSGS, baseline CKD 3, DM, HTNadmitted 1/4 with EColi bacteremia 2/2 acute cholecystitis, s/p perc cholecystostomy, RLL PNA, resp failure, new onset AFib. Developed AKI on CKD, required CRRT 1/10 for 48 hours transiently primarily for ultrafiltration, did not require HD, renal function spontaneously improving. Required thoracentesis 1/23 for R pleural effusion. Baseline creatinine prior to current admission 2.67.   Assessment/ Plan:    1. 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 flutter with RVR. No HD required. Creatinine plateaued, now slow downward - 4 today. Vol status stable. L lung clear on CXR today.   Has left RC AV fistula (mature) if needed. Continue Lasix 40 PO BID for now (Home dose was 40 QD).  No dialysis indications at this time.  Will need to arrange close followup with Dr. Florene Glen 2. RLL PNA/acute cholecystitis:  S/p broad-spectrum antibiotics and s/p percutaneous cholecystostomy-- will need cholecystectomy as an outpatient.   3. Exudative R pleural effusion - s/p thoracentesis of 800 cc bloody fluid by pulm 1/23. No ATB's added yet but abundant WBC's on gm stain. Culture no growth so far. CT mod effusion, complete atelectasis RLL/partial RML.  Await pulm recs 4. S/p EColi bacteremia - finished ATB's for this 5. Anemia - slow decline Hb in the hospital. TSat 9%. Dose Feraheme X1 today 1/26 (since no ATB's). Continue Aranesp 100/week 6. Wheezing - CCM attributes to smoking/atelectasis/collapse. Xopenex/atrovent. Better. 7. Hypertension: good. 8. Hyperphosphatemia: phoslo 1 tab TID with meals. Phos 4.1 9. Atrial flutter with rapid ventricular response. Dilt.  10. Metabolic acidosis: on sodium bicarb 1300 TID - reduced to 650 TID (CO2 28) on 1/24/ CO2 high. Pyatt, MD [facility] [phone] Pager 09/27/2016, 1:44 PM

## 2016-09-27 NOTE — Progress Notes (Signed)
ANTICOAGULATION CONSULT NOTE - Follow Up Consult  Pharmacy Consult for Warfarin Indication: atrial fibrillation  Patient Measurements: Height: 6' 2.5" (189.2 cm) Weight: 227 lb (103 kg) IBW/kg (Calculated) : 83.35   Labs:  Recent Labs  09/25/16 0805 09/25/16 0942 09/26/16 0533 09/27/16 0556  HGB 8.9*  --  8.3* 8.4*  HCT 28.7*  --  26.6* 25.9*  PLT 207  --  194 189  LABPROT 27.3*  --  26.2* 23.6*  INR 2.48  --  2.36 2.07  CREATININE 4.57* 4.50* 4.25* 4.04*    Estimated Creatinine Clearance: 23.2 mL/min (by C-G formula based on SCr of 4.04 mg/dL (H)).   Assessment: 63 YOM with new onset Afib with RVR.  The patient is s/p heparin drip. Oral anticoagulation was held pending possible surgical plans for cholecystitis. There is no immediate surgery planned so pharmacy was consulted to add new warfarin starting 1/15.   INR today remains therapeutic though trending down (INR 2.07 << 2.36, goal of 2-3). Hgb/Hct/plt low but stable. No overt s/sx of bleeding noted.   Goal of Therapy:  INR 2-3 Monitor platelets by anticoagulation protocol: Yes   Plan:  1. Warfarin 5 mg x 1 dose at 1800 today 2. Will continue to monitor for any signs/symptoms of bleeding and will follow up with PT/INR in the a.m.   Thank you for allowing pharmacy to be a part of this patient's care.  Alycia Rossetti, PharmD, BCPS Clinical Pharmacist Pager: [phone] Clinical phone for 09/27/2016 from 7a-3:30p: [phone] If after 3:30p, please call main pharmacy at: [phone] 09/27/2016 8:39 AM

## 2016-09-27 NOTE — Progress Notes (Signed)
Physical Therapy Treatment Patient Details Name: Gary Lang MRN: [id number] DOB: Oct 31, 1950 Today's Date: 09/27/2016    History of Present Illness pt presents from outside hospital with PNA, acute on chronic kidney disease, and Cholecystitis now s/p drain placement.  pt with hx of CKD, DM, HTN, and RA.      PT Comments    Improving steadily from the downturn in status earlier this week.  Strength returning as well as gait stability.   Follow Up Recommendations  Home health PT;Supervision/Assistance - 24 hour     Equipment Recommendations  Rolling walker with 5" wheels;3in1 (PT)    Recommendations for Other Services       Precautions / Restrictions Precautions Precautions: Fall    Mobility  Bed Mobility               General bed mobility comments: in chair on arrival  Transfers Overall transfer level: Needs assistance Equipment used: Rolling walker (2 wheeled) Transfers: Sit to/from Stand Sit to Stand: Min guard         General transfer comment: used safe technique  Ambulation/Gait Ambulation/Gait assistance: Min guard Ambulation Distance (Feet): 240 Feet Assistive device: Rolling walker (2 wheeled) Gait Pattern/deviations: Step-through pattern Gait velocity: slower Gait velocity interpretation: at or above normal speed for age/gender General Gait Details: cues for posture   Stairs            Wheelchair Mobility    Modified Rankin (Stroke Patients Only)       Balance     Sitting balance-Leahy Scale: Good       Standing balance-Leahy Scale: Poor Standing balance comment: reliant on RW                    Cognition Arousal/Alertness: Awake/alert Behavior During Therapy: WFL for tasks assessed/performed Overall Cognitive Status: Within Functional Limits for tasks assessed                      Exercises General Exercises - Lower Extremity Heel Slides: AROM;Strengthening;Both;10 reps (heavy resistance) Straight  Leg Raises: AROM;Strengthening;Both;10 reps;Seated    General Comments        Pertinent Vitals/Pain Pain Assessment: Faces Faces Pain Scale: Hurts a little bit Pain Location: knee R Pain Descriptors / Indicators: Sore Pain Intervention(s): Monitored during session    Home Living                      Prior Function            PT Goals (current goals can now be found in the care plan section) Acute Rehab PT Goals Patient Stated Goal: Pt wants to go home.  PT Goal Formulation: With patient Time For Goal Achievement: 09/23/16 Potential to Achieve Goals: Good Progress towards PT goals: Progressing toward goals    Frequency    Min 3X/week      PT Plan Current plan remains appropriate    Co-evaluation             End of Session Equipment Utilized During Treatment: Oxygen Activity Tolerance: Patient tolerated treatment well Patient left: with call bell/phone within reach;in bed;with bed alarm set     Time: 3220-2542 PT Time Calculation (min) (ACUTE ONLY): 27 min  Charges:  $Gait Training: 8-22 mins $Therapeutic Exercise: 8-22 mins                    G Codes:      Tessie Fass [name]  09/27/2016, 5:41 PM 09/27/2016  Donnella Sham, [name] [phone]  (pager)

## 2016-09-27 NOTE — Progress Notes (Signed)
PROGRESS NOTE                                                                                                                                                                                                             Patient Demographics:    Gary Lang, is a 66 y.o. male, DOB - [birth year], NGE:[id number]  Admit date - 09/05/2016   Admitting Physician Dron Tanna Furry, MD  Outpatient Primary MD for the patient is [name], MD  LOS - 78   Chief Complaint  Patient presents with  . Abdominal Pain       Brief Narrative   66 y.o.malewith PMH as outlined below including biopsy proven variant of FSGS with subsequent CKD III. He was admitted 09/05/16 with E.Coli bacteremia due to acute cholecystitis. He was seen by [facility] and had percutaneous drain placed 01/09 as [facility] felt he was not stable enough for full surgery. Since admission, he has had worsening in renal function and also developed new onset paroxysmal atrial fib. On 1/10 Due to worsening renal function started on CVVHD (prefered over HD due to HR). As well patient with recurrent right pleural effusion,seen by PCCM.   Subjective:    Gary Lang today has, No headache, No chest pain, No abdominal pain - No Nausea,No Cough - SOB.    Assessment  & Plan :    Active Problems:   Community acquired pneumonia of right lower lobe of lung ([place])   Acute respiratory failure with hypoxia (HCC)   Acute cholecystitis   AKI (acute kidney injury) ([place])   Abdominal distention, non-gaseous   Acute abdominal pain   Fever   PAF (paroxysmal atrial fibrillation) (HCC)   Pleural effusion   Sepsis due to E Coli bacteremia and possibly commonly acquired pneumonia/acute cholecystitis: - Chest x-ray showed right lower lobe pneumonia. Patient with tachycardia, leukocytosis, fever and abnormal x-ray finding on admission. - 2 out of 2 blood cultures + Escherichia coli. Drain culture with  ESCHERICHIA COLI  and KLEBSIELLA PNEUMONIAE - S/p perc cholecystostomy 09/08/16.per IR needsOP follow-up in IR clinic in 5-6 weeks - general surgery oked to start coumadin, patient is to follow up with general surgery in a few weeks for elective laparoscopic cholecystectomy  - sepsis resolving, finished 10 days of iv abx, d/c abx on 1/14, repeat blood culture no growth  Acute hypoxic respiratory failure likely in the setting of pneumonia and sepsis: - Finished abx treatment on 1/14, Improved, on room air.   Right pleural effusion - Exudative, recurrent, but empties his 1/23, with significant leukocytosis and elevated lactic acid, repeat CT chest today with moderate effusion left mid/lower lobe atelectasis, plan for recurrent thoracentesis. - No growth to date on pleural fluid 1/23  Blood loss anemia - reassess hgb levels - resolved with discontinuation of heparin and aspirin. Currently patient on coumadin.  Acute on chronic kidney disease, history of FSGN , has AVF in place,   - Renal input greatly appreciated, required CRRT briefly, paresis per cardiology.  New onset atrial fibrillation -Echocardiogram showed vigorous left ventricular systolic function with EF of 65-30%. There were no regional wall motion abnormalities. Also with grade 1 diastolic dysfunction. Converted to sinus rhythm, currently on ccb - Both general surgery and cardiology oked with transition from heparin drip to  Coumadin on 1/15 - Cardiology signed off on 1/15, outpatient follow up recommended he is new on coumadin, coumadin teaching, patient needs to have outpatient coumadin level checked. - INR at goal today.  Uncontrolled diabetes, insulin dependent - Required insulin drip initially -  acceptable onLantus, sliding scale insulin,  - Hemoglobin a1c 9.4  HTN;  - on ccb. clonidine  Physical deconditioning Prior to hospitalization, he is independent with ADLs. PT eval, will need home health, drain care/PT if patient  agrees to.   Code Status : Full  Family Communication  : Wife at bedside   Disposition Plan  :  home   Consults  :  Renal, PCCM, IR   Procedures  : ultrasound thoracentesis 1/23  DVT prophylaxis: on warfain  Antibiotics  :    Anti-infectives    Start     Dose/Rate Route Frequency Ordered Stop   09/12/16 1100  cefTRIAXone (ROCEPHIN) 2 g in dextrose 5 % 50 mL IVPB  Status:  Discontinued     2 g 100 mL/hr over 30 Minutes Intravenous Every 24 hours 09/12/16 1002 09/15/16 1757   09/11/16 1000  piperacillin-tazobactam (ZOSYN) IVPB 2.25 g  Status:  Discontinued     2.25 g 100 mL/hr over 30 Minutes Intravenous Every 6 hours 09/11/16 0945 09/12/16 1002   09/10/16 1200  cefTRIAXone (ROCEPHIN) 2 g in dextrose 5 % 50 mL IVPB  Status:  Discontinued     2 g 100 mL/hr over 30 Minutes Intravenous Every 24 hours 09/10/16 0901 09/11/16 0933   09/08/16 2000  piperacillin-tazobactam (ZOSYN) IVPB 2.25 g  Status:  Discontinued     2.25 g 100 mL/hr over 30 Minutes Intravenous Every 6 hours 09/08/16 1310 09/10/16 0901   09/06/16 1000  azithromycin (ZITHROMAX) tablet 500 mg  Status:  Discontinued     500 mg Oral Every 24 hours 09/05/16 1422 09/08/16 0831   09/06/16 0800  cefTRIAXone (ROCEPHIN) 1 g in dextrose 5 % 50 mL IVPB  Status:  Discontinued     1 g 100 mL/hr over 30 Minutes Intravenous Every 24 hours 09/05/16 1422 09/05/16 1940   09/05/16 2000  piperacillin-tazobactam (ZOSYN) IVPB 3.375 g  Status:  Discontinued     3.375 g 12.5 mL/hr over 240 Minutes Intravenous Every 8 hours 09/05/16 1948 09/08/16 1310   09/05/16 0845  cefTRIAXone (ROCEPHIN) 1 g in dextrose 5 % 50 mL IVPB     1 g 100 mL/hr over 30 Minutes Intravenous  Once 09/05/16 0836 09/05/16 0912   09/05/16 0845  azithromycin (ZITHROMAX) 500 mg  in dextrose 5 % 250 mL IVPB     500 mg 250 mL/hr over 60 Minutes Intravenous  Once 09/05/16 0836 09/05/16 1017        Objective:   Vitals:   09/26/16 2120 09/27/16 0459 09/27/16 0845  09/27/16 0910  BP: (!) 136/57 (!) 113/55  (!) 126/47  Pulse: 91 84  84  Resp: '18 17  17  '$ Temp: 98.6 F (37 C) 98 F (36.7 C)  98.4 F (36.9 C)  TempSrc:    Oral  SpO2: 98% 98% 97% 99%  Weight:      Height:        Wt Readings from Last 3 Encounters:  09/25/16 103 kg (227 lb)  09/02/16 102.1 kg (225 lb)  11/28/14 110.7 kg (244 lb)     Intake/Output Summary (Last 24 hours) at 09/27/16 1501 Last data filed at 09/27/16 1453  Gross per 24 hour  Intake             1132 ml  Output             1855 ml  Net             -723 ml     Physical Exam  Awake Alert, Oriented X 3,  Supple Neck,No JVD,  Symmetrical Chest wall movement,Diminished air entry in right side RRR, no Gallops,Rubs or new Murmurs, No Parasternal Heave +ve B.Sounds, Abd Soft, No tenderness, , No rebound - guarding or rigidity. No Cyanosis, Clubbing or edema, No new Rash or bruise      Data Review:    CBC  Recent Labs Lab 09/23/16 0631 09/24/16 0518 09/25/16 0805 09/26/16 0533 09/27/16 0556  WBC 11.8* 11.9* 10.0 7.5 6.0  HGB 9.1* 9.4* 8.9* 8.3* 8.4*  HCT 29.6* 30.4* 28.7* 26.6* 25.9*  PLT 266 254 207 194 189  MCV 83.9 84.4 87.2 88.7 91.8  MCH 25.8* 26.1 27.1 27.7 29.8  MCHC 30.7 30.9 31.0 31.2 32.4  RDW 15.5 15.8* 15.4 15.6* 16.0*    Chemistries   Recent Labs Lab 09/21/16 0411 09/22/16 0418  09/24/16 0518 09/25/16 0805 09/25/16 0942 09/26/16 0533 09/27/16 0556  NA 140  --   < > 141 140 140 140 139  K 4.4  --   < > 3.5 3.5 3.6 3.6 3.7  CL 104  --   < > 103 101 98* 100* 100*  CO2 26  --   < > '28 29 30 30 30  '$ GLUCOSE 71  --   < > 61* 60* 139* 91 149*  BUN 87*  --   < > 76* 66* 65* 60* 54*  CREATININE 6.30*  --   < > 5.08* 4.57* 4.50* 4.25* 4.04*  CALCIUM 8.1*  --   < > 8.5* 8.3* 8.3* 8.2* 8.1*  MG 2.3 2.5*  --   --   --   --   --   --   < > = values in this interval not  displayed. ------------------------------------------------------------------------------------------------------------------ No results for input(s): CHOL, HDL, LDLCALC, TRIG, CHOLHDL, LDLDIRECT in the last 72 hours.  Lab Results  Component Value Date   HGBA1C 9.4 (H) 09/16/2016   ------------------------------------------------------------------------------------------------------------------ No results for input(s): TSH, T4TOTAL, T3FREE, THYROIDAB in the last 72 hours.  Invalid input(s): FREET3 ------------------------------------------------------------------------------------------------------------------  Recent Labs  09/27/16 0556  TIBC 235*  IRON 21*    Coagulation profile  Recent Labs Lab 09/23/16 0631 09/24/16 0518 09/25/16 0805 09/26/16 0533 09/27/16 0556  INR 3.45 2.64 2.48 2.36  2.07    No results for input(s): DDIMER in the last 72 hours.  Cardiac Enzymes No results for input(s): CKMB, TROPONINI, MYOGLOBIN in the last 168 hours.  Invalid input(s): CK ------------------------------------------------------------------------------------------------------------------    Component Value Date/Time   BNP 101.0 (H) 09/07/2016 0719    Inpatient Medications  Scheduled Meds: . benzonatate  100 mg Oral TID  . calcium acetate  667 mg Oral TID WC  . cholecalciferol  1,000 Units Oral Daily  . cloNIDine  0.3 mg Oral TID  . darbepoetin (ARANESP) injection - NON-DIALYSIS  100 mcg Subcutaneous Q Thu-1800  . diltiazem  240 mg Oral Daily  . feeding supplement (NEPRO CARB STEADY)  237 mL Oral BID BM  . ferumoxytol  510 mg Intravenous Once  . furosemide  40 mg Oral BID  . insulin aspart  0-9 Units Subcutaneous TID AC & HS  . insulin glargine  40 Units Subcutaneous QHS  . ipratropium  0.5 mg Nebulization BID  . levalbuterol  1.25 mg Nebulization TID  . pantoprazole  40 mg Oral Daily  . saccharomyces boulardii  250 mg Oral BID  . senna-docusate  1 tablet Oral QHS   . warfarin  5 mg Oral ONCE-1800  . Warfarin - Pharmacist Dosing Inpatient   Does not apply q1800   Continuous Infusions: PRN Meds:.acetaminophen, bisacodyl, diltiazem, fentaNYL (SUBLIMAZE) injection, metoCLOPramide (REGLAN) injection, metoprolol, simethicone, sodium chloride flush  Micro Results Recent Results (from the past 240 hour(s))  Body fluid culture     Status: None (Preliminary result)   Collection Time: 09/24/16  5:10 PM  Result Value Ref Range Status   Specimen Description FLUID RIGHT PLEURAL  Final   Special Requests NONE  Final   Gram Stain   Final    ABUNDANT WBC PRESENT,BOTH PMN AND MONONUCLEAR NO ORGANISMS SEEN    Culture NO GROWTH 3 DAYS  Final   Report Status PENDING  Incomplete    Radiology Reports Ct Abdomen Pelvis Wo Contrast  Result Date: 09/11/2016 CLINICAL DATA:  Left lower quadrant abdominal pain. EXAM: CT ABDOMEN AND PELVIS WITHOUT CONTRAST TECHNIQUE: Multidetector CT imaging of the abdomen and pelvis was performed following the standard protocol without IV contrast. COMPARISON:  09/07/2016 FINDINGS: Lower chest: Moderate right pleural effusion, increased since prior study. Consolidation in the right lower lobe. Left base atelectasis. Heart is normal size. Hepatobiliary: Interval placement of a cholecystostomy drainage catheter into the gallbladder with decompression of the gallbladder. The subcapsular fluid collection along the inferior right hepatic surface has decreased in size since prior study. Pancreas: No focal abnormality or ductal dilatation. Spleen: No focal abnormality.  Normal size. Adrenals/Urinary Tract: Perinephric stranding around the kidneys bilaterally, stable. Left renal cyst is also stable. No hydronephrosis. Adrenal glands are unremarkable. Foley catheter is present in the bladder which is decompressed. Stomach/Bowel: Colonic diverticulosis. No active diverticulitis. Stomach and small bowel are decompressed, unremarkable. Vascular/Lymphatic: No  evidence of aneurysm or adenopathy. Reproductive: No visible focal abnormality. Other: Small amount of free fluid in the pelvis and adjacent to the liver. No free air. Large left inguinal hernia containing fat, stable. Musculoskeletal: No acute bony abnormality or focal bone lesion. IMPRESSION: Interval placement of cholecystostomy drainage catheter into the gallbladder with decompression. The subcapsular fluid collection along the right hepatic margin has decreased in size as well. Small amount of free fluid in the pelvis and adjacent to the liver. Increasing moderate right pleural effusion with right low consolidation. Cannot exclude pneumonia. Left base atelectasis. Left inguinal hernia containing fat.  Electronically Signed   By: Rolm Baptise M.D.   On: 09/11/2016 12:20   Ct Abdomen Pelvis Wo Contrast  Result Date: 09/07/2016 CLINICAL DATA:  66 year old male with history of sepsis due to E coli bacteremia. Pneumonia. Acute on chronic kidney disease. EXAM: CT ABDOMEN AND PELVIS WITHOUT CONTRAST TECHNIQUE: Multidetector CT imaging of the abdomen and pelvis was performed following the standard protocol without IV contrast. COMPARISON:  CT the abdomen and pelvis 09/03/2016. FINDINGS: Lower chest: Multiple new areas of consolidation and volume loss throughout the lung bases bilaterally, most severe in the lower lobes (right greater than left), concerning for sequela of recent aspiration. Trace bilateral pleural effusions. Hepatobiliary: New compared to the prior examination is a subhepatic fluid collection and beneath the right lobe of the liver which is likely to be subcapsular. This is best appreciated on sagittal image 31 of series 6 and coronal image 56 of series 5 where it measures 10.6 x 3.8 x 8.6 cm. No other definite intrahepatic lesions are identified on today's noncontrast CT examination. Small focus of intermediate to high attenuation lying dependently in the gallbladder likely represents a noncalcified  gallstone. Small amount of pericholecystic fluid. Haziness and inflammatory changes in the soft tissues surrounding the gallbladder. Gallbladder appears moderately distended. Pancreas: No definite pancreatic mass or peripancreatic inflammatory changes are noted on today's noncontrast CT examination. Spleen: Unremarkable. Adrenals/Urinary Tract: 4.5 cm low-attenuation lesion in the lateral aspect of the interpolar region of the left kidney is incompletely characterized, but previously characterized as a simple cyst on prior study 05/21/2011. Right kidney and bilateral adrenal glands are normal in appearance. No hydroureteronephrosis. Urinary bladder is nearly completely decompressed around an indwelling Foley balloon catheter. Small amount of gas non dependently in the lumen of the urinary bladder is iatrogenic. Stomach/Bowel: Unenhanced appearance of the stomach is normal. There is no pathologic dilatation of small bowel or colon. Small duodenal diverticulum extending off the medial aspect of the second portion of the duodenum incidentally noted. No surrounding inflammatory changes to suggest an associated diverticulitis at this time. There also a few scattered colonic diverticulae, also without surrounding inflammatory changes to suggest an acute diverticulitis at this time. Normal appendix. Vascular/Lymphatic: Atherosclerotic calcifications in the abdominal aorta (mild), without evidence of aneurysm. No lymphadenopathy noted in the abdomen or pelvis on today's noncontrast CT examination. Reproductive: Prostate gland seminal vesicles are unremarkable in appearance. Other: Moderate sized left inguinal hernia containing only fat. No significant volume of ascites. No pneumoperitoneum. Musculoskeletal: There are no aggressive appearing lytic or blastic lesions noted in the visualized portions of the skeleton. IMPRESSION: 1. Interval development of extensive pericholecystic fluid. There is a small noncalcified gallstone  or sludge ball near the neck of the gallbladder. Notably, there are also inflammatory changes around the gallbladder and the pericholecystic fluid appears contiguous with a subhepatic fluid collection beneath the right lobe of the liver, which appears to be likely subcapsular in location. Clinical correlation for signs and symptoms of acute cholecystitis is strongly recommended. 2. The appearance of the lower thorax suggests sequela of recent aspiration with multifocal aspiration pneumonitis/pneumonia. 3. Aortic atherosclerosis. 4. Additional incidental findings, as above. These results will be called to the ordering clinician or representative by the Radiologist Assistant, and communication documented in the PACS or zVision Dashboard. Electronically Signed   By: Vinnie Langton M.D.   On: 09/07/2016 16:52   Ct Abdomen Pelvis Wo Contrast  Result Date: 09/03/2016 CLINICAL DATA:  Severe abdominal pain for 8 hours, cramping. Vomiting. History  of hypertension, diabetes, cholecystectomy and appendectomy. EXAM: CT ABDOMEN AND PELVIS WITHOUT CONTRAST TECHNIQUE: Multidetector CT imaging of the abdomen and pelvis was performed following the standard protocol without IV contrast. Oral contrast administered. COMPARISON:  Abdominal radiographs September 02, 2016 and CT abdomen and pelvis May 21, 2011 FINDINGS: LOWER CHEST: Dependent atelectasis. The visualized heart size is normal. No pericardial effusion. Contrast in the distal esophagus may be retained or refluxed. HEPATOBILIARY: Normal. PANCREAS: Normal. SPLEEN: Normal. ADRENALS/URINARY TRACT: Kidneys are orthotopic, demonstrating normal size and morphology. No nephrolithiasis, hydronephrosis; limited assessment for renal masses on this nonenhanced examination. 4.2 cm exophytic LEFT upper pole renal cyst. The unopacified ureters are normal in course and caliber. Urinary bladder is partially distended and unremarkable. Normal adrenal glands. STOMACH/BOWEL: The stomach,  small and large bowel are normal in course and caliber without inflammatory changes. Proximal duodenum diverticulum. Moderate colonic diverticulosis. Scattered small bowel diverticula noted in the RIGHT abdomen. Normal appendix. VASCULAR/LYMPHATIC: Aortoiliac vessels are normal in course and caliber, trace calcific atherosclerosis. No lymphadenopathy by CT size criteria. REPRODUCTIVE: Mild prostatomegaly. OTHER: No intraperitoneal free fluid or free air. MUSCULOSKELETAL: Non-acute. Large fat containing LEFT inguinal hernia. Status post RIGHT inguinal herniorrhaphy. Small fat containing umbilical hernia. Degenerative change of lumbar spine resulting in moderate L5-S1 neural foraminal narrowing. IMPRESSION: No acute intra-abdominal or pelvic process. Small large bowel diverticulosis. Electronically Signed   By: Elon Alas M.D.   On: 09/03/2016 02:29   Dg Chest 2 View  Result Date: 09/05/2016 CLINICAL DATA:  66 year old male with right upper quadrant pain, shortness of breath and generalized weakness for the past 4 days EXAM: CHEST  2 VIEW COMPARISON:  Prior chest x-ray 10/12/2013 FINDINGS: Interval removal of right IJ approach tunneled hemodialysis catheter. New patchy right lower lobe airspace opacity predominantly in the lower lobe. Additionally, there is atelectasis in the right middle lobe. Suspect a small layering pleural effusion as well. Stable borderline cardiomegaly. Probable trace chronic left pleural effusion. Mild vascular congestion without overt edema. No acute osseous abnormality. IMPRESSION: 1. New patchy airspace opacity in the right lower lobe concerning for pneumonia. Followup PA and lateral chest X-ray is recommended in 3-4 weeks following trial of antibiotic therapy to ensure resolution and exclude underlying malignancy. 2. Right middle lobe atelectasis. 3. Small right pleural effusion is likely parapneumonic. 4. Stable cardiomegaly and mild vascular congestion without overt edema. 5.  Similar appearance of chronic small left pleural effusion. Electronically Signed   By: Jacqulynn Cadet M.D.   On: 09/05/2016 08:27   Dg Abdomen 1 View  Result Date: 09/02/2016 CLINICAL DATA:  Nausea and vomiting and generalized abdominal pain and cramping starting this evening. EXAM: ABDOMEN - 1 VIEW COMPARISON:  CT from 05/21/2011 FINDINGS: Moderate colonic stool burden within large bowel. No bowel obstruction or free air. No organomegaly. Phleboliths are seen in the pelvis. L4-5 and L5-S1 facet arthropathy. IMPRESSION: Moderate colonic stool burden.  No bowel obstruction. Electronically Signed   By: Ashley Royalty M.D.   On: 09/02/2016 23:47   Ct Chest Wo Contrast  Result Date: 09/27/2016 CLINICAL DATA:  Pt admitted 09/05/16 for PNA; pt has since developed pleural effusions; pt states that he "just had the fluid removed, and that it re-accumulated within 48 hours"; pt denies any chest pain EXAM: CT CHEST WITHOUT CONTRAST TECHNIQUE: Multidetector CT imaging of the chest was performed following the standard protocol without IV contrast. COMPARISON:  Chest radiograph, 09/26/2016.  Chest CT, 09/07/2016. FINDINGS: Cardiovascular: Heart is normal in size. Great vessels are  normal in caliber. No significant aortic atherosclerotic plaque. No significant coronary artery calcifications. Mediastinum/Nodes: No neck base or axillary masses or enlarged lymph nodes. Thyroid gland is unremarkable. No mediastinal masses or enlarged lymph nodes. No left hilar masses or adenopathy. Right hilum is partly obscured by contiguous lung opacity. Lungs/Pleura: Moderate right pleural effusion. There is atelectasis of the right lower lobe and partial atelectasis of the dependent right middle lobe. Some dependent secretions are noted in the bronchus intermedius and right lower lobe bronchi. There is mild subsegmental atelectasis in the dependent right upper lobe. Mild peribronchovascular linear and reticular opacities are noted in the  left lower lobe there also likely atelectasis. No left pleural effusion. No pulmonary edema. No pneumothorax. Upper Abdomen: Trace amount of ascites adjacent to the liver. Otherwise unremarkable Musculoskeletal: No fracture or acute finding. No osteoblastic or osteolytic lesions. IMPRESSION: 1. Moderate right pleural effusion with complete atelectasis of the right lower lobe and partial atelectasis of the right middle lobe. Mild dependent subsegmental atelectasis noted in the dependent right upper lobe. No centrally obstructing mass is seen. Mucous/secretions are noted in the bronchus intermedius and right lower lobe segmental bronchi. 2. Mild subsegmental atelectasis in the left lower lobe. 3. No convincing pneumonia.  No pulmonary edema. Electronically Signed   By: Lajean Manes M.D.   On: 09/27/2016 11:58   Ct Chest Wo Contrast  Result Date: 09/07/2016 CLINICAL DATA:  66 year old male with hypoxia, fever and right-sided chest pain. Evaluate for pneumonia. EXAM: CT CHEST WITHOUT CONTRAST TECHNIQUE: Multidetector CT imaging of the chest was performed following the standard protocol without IV contrast. COMPARISON:  Chest x-ray obtained earlier today ; prior CT scan of the abdomen and pelvis 09/03/2016 FINDINGS: Cardiovascular: Limited evaluation in the absence of intravenous contrast. Conventional 3 vessel arch anatomy. No evidence of aneurysm. The heart is normal in size. No pericardial effusion. The main pulmonary artery is borderline enlarged at 3.2 cm. Mediastinum/Nodes: Unremarkable CT appearance of the thyroid gland. No suspicious mediastinal or hilar adenopathy. No soft tissue mediastinal mass. The thoracic esophagus is unremarkable. Lungs/Pleura: Multifocal atelectasis involving the right middle and bilateral lower lobes. The degree of opacification of the right lower lobe is slightly greater than expected for atelectasis and a small amount of superimposed consolidation is difficult to exclude entirely.  There is respiratory motion artifact which slightly limits evaluation. Trace left-sided pleural effusion. Upper Abdomen: Incompletely imaged gallbladder distension. Musculoskeletal: No acute fracture or aggressive appearing lytic or blastic osseous lesion. IMPRESSION: 1. Pulmonary findings are most consistent with multifocal atelectasis involving the right middle, right lower and left lower lobes. The degree of opacity in the right lower lobe is slightly greater than expected for the amount of volume loss and therefore a superimposed infiltrate/pneumonia is difficult to exclude radiographically. 2. Trace left pleural effusion. 3. Borderline enlarged main pulmonary artery. Query clinical history of pulmonary arterial hypertension? 4. Incompletely imaged gallbladder appears distended. Does the patient have right upper quadrant pain? If there is clinical concern for cholecystitis, consider right upper quadrant ultrasound. Electronically Signed   By: Jacqulynn Cadet M.D.   On: 09/07/2016 14:10   Ir Perc Cholecystostomy  Result Date: 09/08/2016 INDICATION: ACUTE CHOLECYSTITIS, NON OPERATIVE CANDIDATE EXAM: CHOLECYSTOSTOMY MEDICATIONS: 3.375 G ZOSYN; The antibiotic was administered within an appropriate time frame prior to the initiation of the procedure. ANESTHESIA/SEDATION: Moderate (conscious) sedation was employed during this procedure. A total of Versed 1.0 mg and Fentanyl 25 mcg was administered intravenously. Moderate Sedation Time: 10 minutes. The patient's  level of consciousness and vital signs were monitored continuously by radiology nursing throughout the procedure under my direct supervision. FLUOROSCOPY TIME:  Fluoroscopy Time:  48 seconds (10 mGy). COMPLICATIONS: None immediate. PROCEDURE: Informed written consent was obtained from the patient after a thorough discussion of the procedural risks, benefits and alternatives. All questions were addressed. Maximal Sterile Barrier Technique was utilized  including caps, mask, sterile gowns, sterile gloves, sterile drape, hand hygiene and skin antiseptic. A timeout was performed prior to the initiation of the procedure. Previous imaging reviewed. Preliminary ultrasound performed. The abnormal gallbladder was localized in the right upper quadrant through a lower intercostal space. Overlying skin marked. Under sterile conditions and local anesthesia, percutaneous transhepatic needle access performed of the gallbladder under direct ultrasound. Needle position confirmed with ultrasound. There was return of bile. Guidewire inserted followed by the Accustick dilator set. Amplatz guidewire exchange performed. Tract dilatation performed to insert a 10 [place] drain. Drain catheter position confirmed with ultrasound and fluoroscopy. There was return of exudative bile. Sample sent for Gram stain and culture. Catheter secured with a Prolene suture and connected to external gravity drainage. Sterile dressing applied. No immediate complication. IMPRESSION: Successful ultrasound and fluoroscopic 10 French transhepatic cholecystostomy. Electronically Signed   By: Jerilynn Mages.  Shick M.D.   On: 09/08/2016 12:14   Dg Chest 2v Repeat Same Day  Result Date: 09/23/2016 CLINICAL DATA:  Acute onset of central chest pain, shortness of breath, wheezing, productive cough, congestion and nausea. Initial encounter. EXAM: CHEST  2 VIEW COMPARISON:  Chest radiograph performed 09/14/2016 FINDINGS: The lungs are well-aerated. A large right-sided pleural effusion is noted, with right-sided airspace opacification, concerning for asymmetric pulmonary edema. There is no evidence of pneumothorax. The heart is borderline normal in size. No acute osseous abnormalities are seen. IMPRESSION: Large right-sided pleural effusion, with right-sided airspace opacification, concerning for asymmetric pulmonary edema. Electronically Signed   By: Garald Balding M.D.   On: 09/23/2016 19:36   Dg Chest Port 1 View  Result  Date: 09/26/2016 CLINICAL DATA:  Pleural effusion EXAM: PORTABLE CHEST 1 VIEW COMPARISON:  Two days ago FINDINGS: Right pleural effusion that is moderate to large, increased from prior. Partly visible drain at the lower chest on the right is a cholecystostomy tube based on January 2018 abdominal CT. Normal heart size. Comparatively clear left lung. No pneumothorax. IMPRESSION: Moderate to large right pleural effusion. Fluid volume is mildly increased since 2 days ago. Electronically Signed   By: Monte Fantasia M.D.   On: 09/26/2016 07:59   Dg Chest Port 1 View  Result Date: 09/24/2016 CLINICAL DATA:  Pleural effusion EXAM: PORTABLE CHEST 1 VIEW COMPARISON:  09/23/2016 CXR, 09/07/2016 CT FINDINGS: Interval decrease in large right effusion/along the periphery of the right hemithorax. Right basilar atelectasis is identified. Chronic elevation of the right hemidiaphragm. Heart is top-normal in size. Aortic atherosclerosis is noted. There is mild pulmonary vascular congestion. There is osteoarthritis about both AC and glenohumeral joints. IMPRESSION: Interval decrease in right effusion. Right basilar atelectasis. Aortic atherosclerosis. There is mild vascular congestion consistent with mild CHF. Electronically Signed   By: Ashley Royalty M.D.   On: 09/24/2016 18:06   Dg Chest Port 1 View  Result Date: 09/14/2016 CLINICAL DATA:  Followup pleural effusion. EXAM: PORTABLE CHEST 1 VIEW COMPARISON:  09/13/2016.  Chest CT dated 09/07/2016. FINDINGS: The right jugular catheter is unchanged. Elevation of the right hemidiaphragm and right pleural fluid are unchanged. Mild left lower lobe atelectasis. Stable lower thoracic spine degenerative changes. IMPRESSION:  1. Stable elevated right hemidiaphragm and right pleural fluid. 2. Mild left lower lobe atelectasis. Electronically Signed   By: Claudie Revering M.D.   On: 09/14/2016 08:46   Dg Chest Port 1 View  Result Date: 09/13/2016 CLINICAL DATA:  Follow-up pleural effusion.  EXAM: PORTABLE CHEST 1 VIEW COMPARISON:  09/12/2016 FINDINGS: Right internal jugular central line tip is in the SVC at the azygos level. The left lung remains clear except for mild left base atelectasis. Right lower and middle lobe collapse persists. Probable subpulmonic effusion on the right. IMPRESSION: Only mild residual left base atelectasis. Persistent collapse of the right lower and middle lobes, probably with right subpulmonic effusion. Electronically Signed   By: Nelson Chimes M.D.   On: 09/13/2016 07:29   Dg Chest Port 1 View  Result Date: 09/12/2016 CLINICAL DATA:  Respiratory failure. EXAM: PORTABLE CHEST 1 VIEW COMPARISON:  09/11/2016 . FINDINGS: Right IJ line in stable position. Stable cardiomegaly. Improved pulmonary interstitial edema. Persistent right base atelectasis. Improved left mid lung field subsegmental atelectasis. Small right pleural effusion . No pneumothorax. IMPRESSION: 1. Right IJ line stable position. 2. Stable mild cardiomegaly. Improvement of pulmonary interstitial edema. Small right pleural effusion. 3. Persistent right base atelectasis. Improvement of left mid lung field atelectasis . Electronically Signed   By: Marcello Moores  Register   On: 09/12/2016 07:10   Dg Chest Port 1 View  Result Date: 09/11/2016 CLINICAL DATA:  Hemodialysis catheter insertion, assess tip position ; patient's electronic health record is not available at time of this interpretation EXAM: PORTABLE CHEST 1 VIEW COMPARISON:  Portable exam 1534 hours compared 09/11/2016 FINDINGS: Tip of RIGHT jugular central venous catheter projects over SVC at the level of the carina. Upper normal heart size. Slight pulmonary vascular congestion. Mediastinal contours normal. Improved pulmonary edema with persistent atelectasis versus consolidation at RIGHT lung base. Associated RIGHT pleural effusion. No pneumothorax following central line insertion. IMPRESSION: No pneumothorax following central line placement. Improved  pulmonary edema with persistent atelectasis versus consolidation and pleural effusion at RIGHT lung base. Electronically Signed   By: Lavonia Dana M.D.   On: 09/11/2016 15:46   Dg Chest Port 1 View  Result Date: 09/11/2016 CLINICAL DATA:  Onset epigastric pain today.  Wheezing. EXAM: PORTABLE CHEST 1 VIEW COMPARISON:  09/09/2016 FINDINGS: Diffuse bilateral airspace disease, right greater than left, most confluent in the right lower lung. While this could reflect edema, cannot exclude pneumonia. Heart is borderline in size. Probable right effusion. IMPRESSION: Bilateral airspace disease, right greater than left, most confluent in the right base. Edema versus infection. Electronically Signed   By: Rolm Baptise M.D.   On: 09/11/2016 11:13   Dg Chest Port 1 View  Result Date: 09/09/2016 CLINICAL DATA:  Shortness of breath, wheezing. EXAM: PORTABLE CHEST 1 VIEW COMPARISON:  Radiograph August 07, 2017. FINDINGS: Stable cardiomediastinal silhouette. No pneumothorax is noted. Stable left midlung atelectasis is noted. Stable right basilar opacity is noted concerning for atelectasis or infiltrate. Bony thorax is unremarkable. IMPRESSION: Stable left midlung and right basilar opacities are noted concerning for atelectasis or possibly infiltrate. Electronically Signed   By: Marijo Conception, M.D.   On: 09/09/2016 09:50   Dg Chest Port 1 View  Result Date: 09/07/2016 CLINICAL DATA:  Hypoxia EXAM: PORTABLE CHEST 1 VIEW COMPARISON:  Chest radiograph and chest CT September 07, 2016 FINDINGS: There is persistent patchy airspace consolidation in the left mid lung and right base regions. There is no evident new opacity compared to earlier in  the day. Heart is mildly enlarged with pulmonary vascularity within normal limits. There is atherosclerotic calcification in the aorta. There is stable eventration of the right hemidiaphragm. No adenopathy. No bone lesions. IMPRESSION: Areas of airspace consolidation in the left mid lung and  right base regions. No new opacity. Stable cardiac silhouette. There is aortic atherosclerosis. Electronically Signed   By: Lowella Grip III M.D.   On: 09/07/2016 21:45   Dg Chest Port 1 View  Result Date: 09/07/2016 CLINICAL DATA:  Fever, hypoxia.  Subsequent encounter. EXAM: PORTABLE CHEST 1 VIEW COMPARISON:  09/05/2016 FINDINGS: There is opacity at the right lung base which is similar to the prior exam allowing for differences in patient positioning and technique. Mild linear scarring or atelectasis in the left mid lung, stable. Remainder of the lungs is clear. Elevation right hemidiaphragm is stable. No obvious pleural effusion. No pneumothorax. IMPRESSION: 1. Right base opacity is similar to the prior exam. Although this may be atelectasis only, pneumonia should be considered likely given the symptoms of fever and hypoxia. 2. No significant change from the prior exam allowing for differences in positioning and technique. Electronically Signed   By: Lajean Manes M.D.   On: 09/07/2016 09:18   Dg Abd 2 Views  Result Date: 09/05/2016 CLINICAL DATA:  Abdominal distention EXAM: ABDOMEN - 2 VIEW COMPARISON:  CT abdomen and pelvis September 03, 2016 FINDINGS: Supine and upright images obtained. There is contrast in the colon. There is no bowel dilatation or air-fluid level suggesting bowel obstruction. No free air. There are phleboliths in the pelvis. IMPRESSION: Contrast in colon.  No bowel obstruction or free air evident. Electronically Signed   By: Lowella Grip III M.D.   On: 09/05/2016 11:47   Dg Abd Portable 1v  Result Date: 09/10/2016 CLINICAL DATA:  Abdominal pain.  Cholecystostomy. EXAM: PORTABLE ABDOMEN - 1 VIEW COMPARISON:  CT 09/07/2016 FINDINGS: Pigtail catheter right upper quadrant in satisfactory position most likely within the gallbladder. Normal bowel gas pattern. No obstruction or ileus. Small amount of contrast or ingested tablets in the stomach. IMPRESSION: Cholecystostomy tube in  good position.  Normal bowel gas pattern. Electronically Signed   By: Franchot Gallo M.D.   On: 09/10/2016 15:38     Jacayla Nordell M.D on 09/27/2016 at 3:01 PM  Between 7am to 7pm - Pager - [phone]  After 7pm go to [email or web address] - password [email or web address]  [facility] -  Office  [phone]

## 2016-09-28 ENCOUNTER — Inpatient Hospital Stay (HOSPITAL_COMMUNITY): Payer: PPO

## 2016-09-28 LAB — PROTIME-INR
INR: 1.87
Prothrombin Time: 21.8 seconds — ABNORMAL HIGH (ref 11.4–15.2)

## 2016-09-28 LAB — GLUCOSE, CAPILLARY
GLUCOSE-CAPILLARY: 71 mg/dL (ref 65–99)
GLUCOSE-CAPILLARY: 174 mg/dL — AB (ref 65–99)
GLUCOSE-CAPILLARY: 188 mg/dL — AB (ref 65–99)
Glucose-Capillary: 156 mg/dL — ABNORMAL HIGH (ref 65–99)

## 2016-09-28 LAB — BODY FLUID CELL COUNT WITH DIFFERENTIAL
EOS FL: 1 %
LYMPHS FL: 38 %
Monocyte-Macrophage-Serous Fluid: 14 % — ABNORMAL LOW (ref 50–90)
Neutrophil Count, Fluid: 46 % — ABNORMAL HIGH (ref 0–25)
WBC FLUID: 1453 uL — AB (ref 0–1000)

## 2016-09-28 LAB — CBC
HEMATOCRIT: 23.5 % — AB (ref 39.0–52.0)
Hemoglobin: 7.4 g/dL — ABNORMAL LOW (ref 13.0–17.0)
MCH: 28.6 pg (ref 26.0–34.0)
MCHC: 31.5 g/dL (ref 30.0–36.0)
MCV: 90.7 fL (ref 78.0–100.0)
PLATELETS: 211 10*3/uL (ref 150–400)
RBC: 2.59 MIL/uL — ABNORMAL LOW (ref 4.22–5.81)
RDW: 15.4 % (ref 11.5–15.5)
WBC: 6.2 10*3/uL (ref 4.0–10.5)

## 2016-09-28 LAB — PROTEIN, BODY FLUID: Total protein, fluid: 4.1 g/dL

## 2016-09-28 LAB — RENAL FUNCTION PANEL
ANION GAP: 9 (ref 5–15)
Albumin: 1.6 g/dL — ABNORMAL LOW (ref 3.5–5.0)
BUN: 47 mg/dL — ABNORMAL HIGH (ref 6–20)
CHLORIDE: 100 mmol/L — AB (ref 101–111)
CO2: 30 mmol/L (ref 22–32)
Calcium: 8.1 mg/dL — ABNORMAL LOW (ref 8.9–10.3)
Creatinine, Ser: 3.72 mg/dL — ABNORMAL HIGH (ref 0.61–1.24)
GFR calc non Af Amer: 16 mL/min — ABNORMAL LOW (ref >60)
GFR, EST AFRICAN AMERICAN: 18 mL/min — AB (ref >60)
Glucose, Bld: 111 mg/dL — ABNORMAL HIGH (ref 65–99)
Phosphorus: 3.7 mg/dL (ref 2.5–4.6)
Potassium: 3.7 mmol/L (ref 3.5–5.1)
SODIUM: 139 mmol/L (ref 135–145)

## 2016-09-28 LAB — GLUCOSE, SEROUS FLUID: GLUCOSE FL: 105 mg/dL

## 2016-09-28 LAB — GRAM STAIN

## 2016-09-28 LAB — LACTATE DEHYDROGENASE, PLEURAL OR PERITONEAL FLUID: LD FL: 622 U/L — AB (ref 3–23)

## 2016-09-28 LAB — BODY FLUID CULTURE: CULTURE: NO GROWTH

## 2016-09-28 LAB — CHOLESTEROL, BODY FLUID: CHOL FL: 77 mg/dL

## 2016-09-28 LAB — TOTAL BILIRUBIN, BODY FLUID: Total bilirubin, fluid: 2.2 mg/dL

## 2016-09-28 LAB — LACTATE DEHYDROGENASE: LDH: 198 U/L — ABNORMAL HIGH (ref 98–192)

## 2016-09-28 LAB — PROTEIN, TOTAL: TOTAL PROTEIN: 6.6 g/dL (ref 6.5–8.1)

## 2016-09-28 MED ORDER — LIDOCAINE HCL (PF) 1 % IJ SOLN
INTRAMUSCULAR | Status: AC
  Filled 2016-09-28: qty 10

## 2016-09-28 MED ORDER — IPRATROPIUM BROMIDE 0.02 % IN SOLN
0.5000 mg | Freq: Three times a day (TID) | RESPIRATORY_TRACT | Status: DC
  Administered 2016-09-28 (×2): 0.5 mg via RESPIRATORY_TRACT
  Filled 2016-09-28 (×3): qty 2.5

## 2016-09-28 MED ORDER — INSULIN GLARGINE 100 UNIT/ML ~~LOC~~ SOLN
35.0000 [IU] | Freq: Every day | SUBCUTANEOUS | Status: DC
  Administered 2016-09-29: 35 [IU] via SUBCUTANEOUS
  Filled 2016-09-28 (×2): qty 0.35

## 2016-09-28 MED ORDER — WARFARIN SODIUM 5 MG PO TABS
5.0000 mg | ORAL_TABLET | Freq: Once | ORAL | Status: AC
  Administered 2016-09-28: 5 mg via ORAL
  Filled 2016-09-28: qty 1

## 2016-09-28 NOTE — Progress Notes (Signed)
[facility] Rounding Note Subjective:  Had another thoracentesis today - this time in IR 680 ml removed Says breathing is stable  Objective: I/O last 3 completed shifts: In: 2025 [P.O.:1242; Other:495] Out: 2850 [Urine:2375; Drains:350; Other:125] No intake/output data recorded.  Physical Examination Very nice AAM NAD Wearing nasal cannula O2ch No JVD L side clear. Breath sounds still very reduced at R base Abd soft, perc chole tube in place, brown fluid  No LE edema L FA AVF + bruit and thrill   Recent Labs  09/27/16 0556 09/28/16 0501  WBC 6.0 6.2  HGB 8.4* 7.4*  HCT 25.9* 23.5*  PLT 189 211    Recent Labs  09/27/16 0556 09/28/16 0501  NA 139 139  K 3.7 3.7  CL 100* 100*  CO2 30 30  GLUCOSE 149* 111*  BUN 54* 47*  CREATININE 4.04* 3.72*  CALCIUM 8.1* 8.1*   Iron/TIBC/Ferritin/ %Sat    Component Value Date/Time   IRON 21 (L) 09/27/2016 0556   TIBC 235 (L) 09/27/2016 0556   IRONPCTSAT 9 (L) 09/27/2016 0556   Dg Chest 1 View  Result Date: 09/28/2016 CLINICAL DATA:  Post thoracentesis EXAM: CHEST 1 VIEW COMPARISON:  09/26/2016 FINDINGS: Decreasing right pleural effusion following thoracentesis. No pneumothorax. Continued small to moderate right pleural effusion with right lower lobe atelectasis. Somewhat better aeration of the right base since prior study. No confluent opacity on the left. IMPRESSION: No pneumothorax following right thoracentesis. Small to moderate right pleural effusion with right base atelectasis, improved since prior study. Electronically Signed   By: Rolm Baptise M.D.   On: 09/28/2016 11:54   US Thoracentesis Asp Pleural Space W/img Guide  Result Date: 09/28/2016 INDICATION: Patient with recent cholecystitis and right-sided pneumonia. Prior thoracentesis earlier this week. Recurrent right pleural effusion. Request is made for diagnostic and therapeutic thoracentesis. EXAM: ULTRASOUND GUIDED DIAGNOSTIC AND THERAPEUTIC THORACENTESIS MEDICATIONS: 1%  lidocaine COMPLICATIONS: None immediate. PROCEDURE: An ultrasound guided thoracentesis was thoroughly discussed with the patient and questions answered. The benefits, risks, alternatives and complications were also discussed. The patient understands and wishes to proceed with the procedure. Written consent was obtained. Ultrasound was performed to localize and mark an adequate pocket of fluid in the right chest. The area was then prepped and draped in the normal sterile fashion. 1% Lidocaine was used for local anesthesia. Under ultrasound guidance a Safe-T-Centesis catheter was introduced. Thoracentesis was performed. The catheter was removed and a dressing applied. FINDINGS: A total of approximately 0.68 L of bloody fluid was removed. Samples were sent to the laboratory as requested by the clinical team. IMPRESSION: Successful ultrasound guided right thoracentesis yielding 0.68 L of pleural fluid. Read by: Saverio Danker, PA-C Electronically Signed   By: Markus Daft M.D.   On: 09/28/2016 11:44    Scheduled medications . benzonatate  100 mg Oral TID  . calcium acetate  667 mg Oral TID WC  . cholecalciferol  1,000 Units Oral Daily  . cloNIDine  0.3 mg Oral TID  . darbepoetin (ARANESP) injection - NON-DIALYSIS  100 mcg Subcutaneous Q Thu-1800  . diltiazem  240 mg Oral Daily  . feeding supplement (NEPRO CARB STEADY)  237 mL Oral BID BM  . furosemide  40 mg Oral BID  . insulin aspart  0-9 Units Subcutaneous TID AC & HS  . [START ON 09/29/2016] insulin glargine  35 Units Subcutaneous QAC breakfast  . ipratropium  0.5 mg Nebulization TID  . levalbuterol  1.25 mg Nebulization TID  . lidocaine (PF)      .  pantoprazole  40 mg Oral Daily  . saccharomyces boulardii  250 mg Oral BID  . senna-docusate  1 tablet Oral QHS  . warfarin  5 mg Oral ONCE-1800  . 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 Developed AKI on CKD, required CRRT 1/10 for 48 hours transiently primarily for ultrafiltration, did not require HD, renal function spontaneously improving. Required thoracentesis 1/23 for R pleural effusion. Baseline creatinine prior to current admission 2.67.   Assessment/ Plan:    1. 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 flutter with RVR. No HD required. Creatinine plateaued, now slow downward - 3.7 today. Vol status stable. .Has left RC AV fistula (mature) if needed. Continue Lasix 40 PO BID for now (Home dose was 40 QD).  No dialysis indications.  Will need to arrange close followup with Dr. Florene Glen 2. RLL PNA/acute cholecystitis:  S/p broad-spectrum antibiotics and s/p percutaneous cholecystostomy-- will need cholecystectomy as an outpatient.   3. Exudative R pleural effusion - s/p thoracentesis of 800 cc bloody fluid by pulm 1/23,  680 ml by IR today 1/27. Culture no growth so far. CT mod effusion, complete atelectasis RLL/partial RML.  Await further pulm recs 4. S/p EColi bacteremia - finished ATB's for this 5. Anemia - slow decline Hb in the hospital. TSat 9%. Dosed Feraheme X1 1/26 (since no ATB's). Continue Aranesp 100/week 6. Wheezing - CCM attributes to smoking/atelectasis/collapse. Xopenex/atrovent. Better. No wheezing today. 7. Hypertension: good. 8. Hyperphosphatemia: phoslo 1 tab TID with meals. Controlled 9. Atrial flutter with rapid ventricular response. Dilt.  10. Metabolic acidosis: resolved. Stopped po bicarb  Jamal Maes, MD [facility] [phone] Pager 09/28/2016, 4:15 PM

## 2016-09-28 NOTE — Progress Notes (Signed)
ANTICOAGULATION CONSULT NOTE - Follow Up Consult  Pharmacy Consult for Warfarin Indication: atrial fibrillation  Patient Measurements: Height: 6' 2.5" (189.2 cm) Weight: 227 lb (103 kg) IBW/kg (Calculated) : 83.35   Labs:  Recent Labs  09/26/16 0533 09/27/16 0556 09/28/16 0501  HGB 8.3* 8.4* 7.4*  HCT 26.6* 25.9* 23.5*  PLT 194 189 211  LABPROT 26.2* 23.6* 21.8*  INR 2.36 2.07 1.87  CREATININE 4.25* 4.04* 3.72*    Estimated Creatinine Clearance: 25.2 mL/min (by C-G formula based on SCr of 3.72 mg/dL (H)).   Assessment: 38 YOM with new onset Afib with RVR.  The patient is s/p heparin drip. Oral anticoagulation was previously held pending possible surgical plans for cholecystitis. There is no immediate surgery planned so pharmacy was consulted to add new warfarin starting 1/15.   INR 1.87 subtherapeutic today after trending down last few days (INR 2.36 >> 2.07 >> 1.87, goal of 2-3). Hgb/Hct/plt low but stable. No overt s/sx of bleeding noted.   Goal of Therapy:  INR 2-3 Monitor platelets by anticoagulation protocol: Yes   Plan:  1. Warfarin 5 mg x 1 dose at 1800 today 2. Will continue to monitor for any signs/symptoms of bleeding and will follow up with PT/INR in the a.m.   Thank you for allowing pharmacy to be a part of this patient's care.  Myer Peer Grayland Ormond), PharmD  [id number] Pharmacy Resident Pager: [phone] 09/28/2016 8:55 AM

## 2016-09-28 NOTE — Progress Notes (Signed)
PROGRESS NOTE                                                                                                                                                                                                             Patient Demographics:    Gary Lang, is a 66 y.o. male, DOB - 1951-05-19, RWE:[id number]  Admit date - 09/05/2016   Admitting Physician Dron Tanna Furry, MD  Outpatient Primary MD for the patient is [name], MD  LOS - 23   Chief Complaint  Patient presents with  . Abdominal Pain       Brief Narrative   66 y.o.malewith PMH as outlined below including biopsy proven variant of FSGS with subsequent CKD III. He was admitted 09/05/16 with E.Coli bacteremia due to acute cholecystitis. He was seen by [facility] and had percutaneous drain placed 01/09 as [facility] felt he was not stable enough for full surgery. Since admission, he has had worsening in renal function and also developed new onset paroxysmal atrial fib. On 1/10 Due to worsening renal function started on CVVHD (prefered over HD due to HR). As well patient with recurrent right pleural effusion,seen by PCCM.   Subjective:    Gary Lang today has, No headache, No chest pain, No abdominal pain - No Nausea,No Cough - SOB.    Assessment  & Plan :    Active Problems:   Community acquired pneumonia of right lower lobe of lung ([place])   Acute respiratory failure with hypoxia (H[facility])   Acute cholecystitis   AKI (acute kidney injury) ([place])   Abdominal distention, non-gaseous   Acute abdominal pain   Fever   PAF (paroxysmal atrial fibrillation) (HCC)   Pleural effusion   Sepsis due to E Coli bacteremia and possibly commonly acquired pneumonia/acute cholecystitis: - Chest x-ray showed right lower lobe pneumonia. Patient with tachycardia, leukocytosis, fever and abnormal x-ray finding on admission. - 2 out of 2 blood cultures + Escherichia coli. Drain culture with  ESCHERICHIA COLI  and KLEBSIELLA PNEUMONIAE - S/p perc cholecystostomy 09/08/16.per IR needsOP follow-up in IR clinic in 5-6 weeks - general surgery oked to start coumadin, patient is to follow up with general surgery in a few weeks for elective laparoscopic cholecystectomy  - sepsis resolving, finished 10 days of iv abx, d/c abx on 1/14, repeat blood culture no growth  Acute hypoxic respiratory failure likely in the setting of pneumonia and sepsis: - Finished abx treatment on 1/14, Improved, on room air.   Right pleural effusion - Exudative, recurrent, status post 1/23, with significant leukocytosis and elevated lactic acid, repeat CT chest 1/26 with moderate effusion left mid/lower lobe atelectasis, went again today for another thoracentesis, looks exudative as significant leukocytosis and elevated lactic acid - No growth to date on pleural fluid 1/23 - We'll await recommendation from [facility] M.  Blood loss anemia - resolved with discontinuation of heparin and aspirin. Currently patient on coumadin. - hemoglobin is 7.4 today, continue to monitor and transfuse for hemoglobin less than 7  Acute on chronic kidney disease, history of FSGN , has AVF in place,   - Renal input greatly appreciated, required CRRT briefly, diuresis per cardiology.  New onset atrial fibrillation -Echocardiogram showed vigorous left ventricular systolic function with EF of 65-30%. There were no regional wall motion abnormalities. Also with grade 1 diastolic dysfunction. Converted to sinus rhythm, currently on ccb - Both general surgery and cardiology oked with transition from heparin drip to  Coumadin on 1/15 - Cardiology signed off on 1/15, outpatient follow up recommended he is new on coumadin, coumadin teaching, patient needs to have outpatient coumadin level checked. - INR at goal today.  Uncontrolled diabetes, insulin dependent - Required insulin drip initially -  acceptable onLantus, sliding scale insulin,  - Hemoglobin  a1c 9.4  HTN;  - on ccb. clonidine  Physical deconditioning Prior to hospitalization, he is independent with ADLs. PT eval, will need home health, drain care/PT if patient agrees to.   Code Status : Full  Family Communication  : Wife at bedside   Disposition Plan  :  home when stable  Consults  :  Renal, PCCM, IR   Procedures  : ultrasound thoracentesis 1/23  DVT prophylaxis: on warfain  Antibiotics  :    Anti-infectives    Start     Dose/Rate Route Frequency Ordered Stop   09/12/16 1100  cefTRIAXone (ROCEPHIN) 2 g in dextrose 5 % 50 mL IVPB  Status:  Discontinued     2 g 100 mL/hr over 30 Minutes Intravenous Every 24 hours 09/12/16 1002 09/15/16 1757   09/11/16 1000  piperacillin-tazobactam (ZOSYN) IVPB 2.25 g  Status:  Discontinued     2.25 g 100 mL/hr over 30 Minutes Intravenous Every 6 hours 09/11/16 0945 09/12/16 1002   09/10/16 1200  cefTRIAXone (ROCEPHIN) 2 g in dextrose 5 % 50 mL IVPB  Status:  Discontinued     2 g 100 mL/hr over 30 Minutes Intravenous Every 24 hours 09/10/16 0901 09/11/16 0933   09/08/16 2000  piperacillin-tazobactam (ZOSYN) IVPB 2.25 g  Status:  Discontinued     2.25 g 100 mL/hr over 30 Minutes Intravenous Every 6 hours 09/08/16 1310 09/10/16 0901   09/06/16 1000  azithromycin (ZITHROMAX) tablet 500 mg  Status:  Discontinued     500 mg Oral Every 24 hours 09/05/16 1422 09/08/16 0831   09/06/16 0800  cefTRIAXone (ROCEPHIN) 1 g in dextrose 5 % 50 mL IVPB  Status:  Discontinued     1 g 100 mL/hr over 30 Minutes Intravenous Every 24 hours 09/05/16 1422 09/05/16 1940   09/05/16 2000  piperacillin-tazobactam (ZOSYN) IVPB 3.375 g  Status:  Discontinued     3.375 g 12.5 mL/hr over 240 Minutes Intravenous Every 8 hours 09/05/16 1948 09/08/16 1310   09/05/16 0845  cefTRIAXone (ROCEPHIN) 1 g in dextrose 5 % 50  mL IVPB     1 g 100 mL/hr over 30 Minutes Intravenous  Once 09/05/16 0836 09/05/16 0912   09/05/16 0845  azithromycin (ZITHROMAX) 500 mg in  dextrose 5 % 250 mL IVPB     500 mg 250 mL/hr over 60 Minutes Intravenous  Once 09/05/16 0836 09/05/16 1017        Objective:   Vitals:   09/28/16 0837 09/28/16 0920 09/28/16 1109 09/28/16 1131  BP:  112/61 (!) 124/58 124/65  Pulse:  61    Resp:  20    Temp:  98.4 F (36.9 C)    TempSrc:  Oral    SpO2: 92% 93%    Weight:      Height:        Wt Readings from Last 3 Encounters:  09/25/16 103 kg (227 lb)  09/02/16 102.1 kg (225 lb)  11/28/14 110.7 kg (244 lb)     Intake/Output Summary (Last 24 hours) at 09/28/16 1415 Last data filed at 09/28/16 0900  Gross per 24 hour  Intake              712 ml  Output             1525 ml  Net             -813 ml     Physical Exam  Awake Alert, Oriented X 3,  Supple Neck,No JVD,  Symmetrical Chest wall movement,Diminished air entry R>L. RRR, no Gallops,Rubs or new Murmurs, No Parasternal Heave +ve B.Sounds, Abd Soft, No tenderness, , No rebound - guarding or rigidity. No Cyanosis, Clubbing or edema, No new Rash or bruise      Data Review:    CBC  Recent Labs Lab 09/24/16 0518 09/25/16 0805 09/26/16 0533 09/27/16 0556 09/28/16 0501  WBC 11.9* 10.0 7.5 6.0 6.2  HGB 9.4* 8.9* 8.3* 8.4* 7.4*  HCT 30.4* 28.7* 26.6* 25.9* 23.5*  PLT 254 207 194 189 211  MCV 84.4 87.2 88.7 91.8 90.7  MCH 26.1 27.1 27.7 29.8 28.6  MCHC 30.9 31.0 31.2 32.4 31.5  RDW 15.8* 15.4 15.6* 16.0* 15.4    Chemistries   Recent Labs Lab 09/22/16 0418  09/25/16 0805 09/25/16 0942 09/26/16 0533 09/27/16 0556 09/28/16 0501  NA  --   < > 140 140 140 139 139  K  --   < > 3.5 3.6 3.6 3.7 3.7  CL  --   < > 101 98* 100* 100* 100*  CO2  --   < > '29 30 30 30 30  '$ GLUCOSE  --   < > 60* 139* 91 149* 111*  BUN  --   < > 66* 65* 60* 54* 47*  CREATININE  --   < > 4.57* 4.50* 4.25* 4.04* 3.72*  CALCIUM  --   < > 8.3* 8.3* 8.2* 8.1* 8.1*  MG 2.5*  --   --   --   --   --   --   < > = values in this interval not  displayed. ------------------------------------------------------------------------------------------------------------------ No results for input(s): CHOL, HDL, LDLCALC, TRIG, CHOLHDL, LDLDIRECT in the last 72 hours.  Lab Results  Component Value Date   HGBA1C 9.4 (H) 09/16/2016   ------------------------------------------------------------------------------------------------------------------ No results for input(s): TSH, T4TOTAL, T3FREE, THYROIDAB in the last 72 hours.  Invalid input(s): FREET3 ------------------------------------------------------------------------------------------------------------------  Recent Labs  09/27/16 0556  TIBC 235*  IRON 21*    Coagulation profile  Recent Labs Lab 09/24/16 0518 09/25/16 0805 09/26/16 0533 09/27/16  2956 09/28/16 0501  INR 2.64 2.48 2.36 2.07 1.87    No results for input(s): DDIMER in the last 72 hours.  Cardiac Enzymes No results for input(s): CKMB, TROPONINI, MYOGLOBIN in the last 168 hours.  Invalid input(s): CK ------------------------------------------------------------------------------------------------------------------    Component Value Date/Time   BNP 101.0 (H) 09/07/2016 0719    Inpatient Medications  Scheduled Meds: . benzonatate  100 mg Oral TID  . calcium acetate  667 mg Oral TID WC  . cholecalciferol  1,000 Units Oral Daily  . cloNIDine  0.3 mg Oral TID  . darbepoetin (ARANESP) injection - NON-DIALYSIS  100 mcg Subcutaneous Q Thu-1800  . diltiazem  240 mg Oral Daily  . feeding supplement (NEPRO CARB STEADY)  237 mL Oral BID BM  . furosemide  40 mg Oral BID  . insulin aspart  0-9 Units Subcutaneous TID AC & HS  . [START ON 09/29/2016] insulin glargine  35 Units Subcutaneous QAC breakfast  . ipratropium  0.5 mg Nebulization TID  . levalbuterol  1.25 mg Nebulization TID  . lidocaine (PF)      . pantoprazole  40 mg Oral Daily  . saccharomyces boulardii  250 mg Oral BID  . senna-docusate  1  tablet Oral QHS  . warfarin  5 mg Oral ONCE-1800  . Warfarin - Pharmacist Dosing Inpatient   Does not apply q1800   Continuous Infusions: PRN Meds:.acetaminophen, bisacodyl, diltiazem, fentaNYL (SUBLIMAZE) injection, metoCLOPramide (REGLAN) injection, metoprolol, simethicone, sodium chloride flush  Micro Results Recent Results (from the past 240 hour(s))  Body fluid culture     Status: None   Collection Time: 09/24/16  5:10 PM  Result Value Ref Range Status   Specimen Description FLUID RIGHT PLEURAL  Final   Special Requests NONE  Final   Gram Stain   Final    ABUNDANT WBC PRESENT,BOTH PMN AND MONONUCLEAR NO ORGANISMS SEEN    Culture NO GROWTH 3 DAYS  Final   Report Status 09/28/2016 FINAL  Final  Gram stain     Status: None (Preliminary result)   Collection Time: 09/28/16 11:30 AM  Result Value Ref Range Status   Specimen Description PLEURAL  Final   Special Requests NONE  Final   Gram Stain   Final    ABUNDANT WBC PRESENT, PREDOMINANTLY MONONUCLEAR NO ORGANISMS SEEN    Report Status PENDING  Incomplete    Radiology Reports Ct Abdomen Pelvis Wo Contrast  Result Date: 09/11/2016 CLINICAL DATA:  Left lower quadrant abdominal pain. EXAM: CT ABDOMEN AND PELVIS WITHOUT CONTRAST TECHNIQUE: Multidetector CT imaging of the abdomen and pelvis was performed following the standard protocol without IV contrast. COMPARISON:  09/07/2016 FINDINGS: Lower chest: Moderate right pleural effusion, increased since prior study. Consolidation in the right lower lobe. Left base atelectasis. Heart is normal size. Hepatobiliary: Interval placement of a cholecystostomy drainage catheter into the gallbladder with decompression of the gallbladder. The subcapsular fluid collection along the inferior right hepatic surface has decreased in size since prior study. Pancreas: No focal abnormality or ductal dilatation. Spleen: No focal abnormality.  Normal size. Adrenals/Urinary Tract: Perinephric stranding around  the kidneys bilaterally, stable. Left renal cyst is also stable. No hydronephrosis. Adrenal glands are unremarkable. Foley catheter is present in the bladder which is decompressed. Stomach/Bowel: Colonic diverticulosis. No active diverticulitis. Stomach and small bowel are decompressed, unremarkable. Vascular/Lymphatic: No evidence of aneurysm or adenopathy. Reproductive: No visible focal abnormality. Other: Small amount of free fluid in the pelvis and adjacent to the liver. No free air.  Large left inguinal hernia containing fat, stable. Musculoskeletal: No acute bony abnormality or focal bone lesion. IMPRESSION: Interval placement of cholecystostomy drainage catheter into the gallbladder with decompression. The subcapsular fluid collection along the right hepatic margin has decreased in size as well. Small amount of free fluid in the pelvis and adjacent to the liver. Increasing moderate right pleural effusion with right low consolidation. Cannot exclude pneumonia. Left base atelectasis. Left inguinal hernia containing fat. Electronically Signed   By: Rolm Baptise M.D.   On: 09/11/2016 12:20   Ct Abdomen Pelvis Wo Contrast  Result Date: 09/07/2016 CLINICAL DATA:  66 year old male with history of sepsis due to E coli bacteremia. Pneumonia. Acute on chronic kidney disease. EXAM: CT ABDOMEN AND PELVIS WITHOUT CONTRAST TECHNIQUE: Multidetector CT imaging of the abdomen and pelvis was performed following the standard protocol without IV contrast. COMPARISON:  CT the abdomen and pelvis 09/03/2016. FINDINGS: Lower chest: Multiple new areas of consolidation and volume loss throughout the lung bases bilaterally, most severe in the lower lobes (right greater than left), concerning for sequela of recent aspiration. Trace bilateral pleural effusions. Hepatobiliary: New compared to the prior examination is a subhepatic fluid collection and beneath the right lobe of the liver which is likely to be subcapsular. This is best  appreciated on sagittal image 31 of series 6 and coronal image 56 of series 5 where it measures 10.6 x 3.8 x 8.6 cm. No other definite intrahepatic lesions are identified on today's noncontrast CT examination. Small focus of intermediate to high attenuation lying dependently in the gallbladder likely represents a noncalcified gallstone. Small amount of pericholecystic fluid. Haziness and inflammatory changes in the soft tissues surrounding the gallbladder. Gallbladder appears moderately distended. Pancreas: No definite pancreatic mass or peripancreatic inflammatory changes are noted on today's noncontrast CT examination. Spleen: Unremarkable. Adrenals/Urinary Tract: 4.5 cm low-attenuation lesion in the lateral aspect of the interpolar region of the left kidney is incompletely characterized, but previously characterized as a simple cyst on prior study 05/21/2011. Right kidney and bilateral adrenal glands are normal in appearance. No hydroureteronephrosis. Urinary bladder is nearly completely decompressed around an indwelling Foley balloon catheter. Small amount of gas non dependently in the lumen of the urinary bladder is iatrogenic. Stomach/Bowel: Unenhanced appearance of the stomach is normal. There is no pathologic dilatation of small bowel or colon. Small duodenal diverticulum extending off the medial aspect of the second portion of the duodenum incidentally noted. No surrounding inflammatory changes to suggest an associated diverticulitis at this time. There also a few scattered colonic diverticulae, also without surrounding inflammatory changes to suggest an acute diverticulitis at this time. Normal appendix. Vascular/Lymphatic: Atherosclerotic calcifications in the abdominal aorta (mild), without evidence of aneurysm. No lymphadenopathy noted in the abdomen or pelvis on today's noncontrast CT examination. Reproductive: Prostate gland seminal vesicles are unremarkable in appearance. Other: Moderate sized left  inguinal hernia containing only fat. No significant volume of ascites. No pneumoperitoneum. Musculoskeletal: There are no aggressive appearing lytic or blastic lesions noted in the visualized portions of the skeleton. IMPRESSION: 1. Interval development of extensive pericholecystic fluid. There is a small noncalcified gallstone or sludge ball near the neck of the gallbladder. Notably, there are also inflammatory changes around the gallbladder and the pericholecystic fluid appears contiguous with a subhepatic fluid collection beneath the right lobe of the liver, which appears to be likely subcapsular in location. Clinical correlation for signs and symptoms of acute cholecystitis is strongly recommended. 2. The appearance of the lower thorax suggests sequela of  recent aspiration with multifocal aspiration pneumonitis/pneumonia. 3. Aortic atherosclerosis. 4. Additional incidental findings, as above. These results will be called to the ordering clinician or representative by the Radiologist Assistant, and communication documented in the PACS or zVision Dashboard. Electronically Signed   By: Vinnie Langton M.D.   On: 09/07/2016 16:52   Ct Abdomen Pelvis Wo Contrast  Result Date: 09/03/2016 CLINICAL DATA:  Severe abdominal pain for 8 hours, cramping. Vomiting. History of hypertension, diabetes, cholecystectomy and appendectomy. EXAM: CT ABDOMEN AND PELVIS WITHOUT CONTRAST TECHNIQUE: Multidetector CT imaging of the abdomen and pelvis was performed following the standard protocol without IV contrast. Oral contrast administered. COMPARISON:  Abdominal radiographs September 02, 2016 and CT abdomen and pelvis May 21, 2011 FINDINGS: LOWER CHEST: Dependent atelectasis. The visualized heart size is normal. No pericardial effusion. Contrast in the distal esophagus may be retained or refluxed. HEPATOBILIARY: Normal. PANCREAS: Normal. SPLEEN: Normal. ADRENALS/URINARY TRACT: Kidneys are orthotopic, demonstrating normal size  and morphology. No nephrolithiasis, hydronephrosis; limited assessment for renal masses on this nonenhanced examination. 4.2 cm exophytic LEFT upper pole renal cyst. The unopacified ureters are normal in course and caliber. Urinary bladder is partially distended and unremarkable. Normal adrenal glands. STOMACH/BOWEL: The stomach, small and large bowel are normal in course and caliber without inflammatory changes. Proximal duodenum diverticulum. Moderate colonic diverticulosis. Scattered small bowel diverticula noted in the RIGHT abdomen. Normal appendix. VASCULAR/LYMPHATIC: Aortoiliac vessels are normal in course and caliber, trace calcific atherosclerosis. No lymphadenopathy by CT size criteria. REPRODUCTIVE: Mild prostatomegaly. OTHER: No intraperitoneal free fluid or free air. MUSCULOSKELETAL: Non-acute. Large fat containing LEFT inguinal hernia. Status post RIGHT inguinal herniorrhaphy. Small fat containing umbilical hernia. Degenerative change of lumbar spine resulting in moderate L5-S1 neural foraminal narrowing. IMPRESSION: No acute intra-abdominal or pelvic process. Small large bowel diverticulosis. Electronically Signed   By: Elon Alas M.D.   On: 09/03/2016 02:29   Dg Chest 1 View  Result Date: 09/28/2016 CLINICAL DATA:  Post thoracentesis EXAM: CHEST 1 VIEW COMPARISON:  09/26/2016 FINDINGS: Decreasing right pleural effusion following thoracentesis. No pneumothorax. Continued small to moderate right pleural effusion with right lower lobe atelectasis. Somewhat better aeration of the right base since prior study. No confluent opacity on the left. IMPRESSION: No pneumothorax following right thoracentesis. Small to moderate right pleural effusion with right base atelectasis, improved since prior study. Electronically Signed   By: Rolm Baptise M.D.   On: 09/28/2016 11:54   Dg Chest 2 View  Result Date: 09/05/2016 CLINICAL DATA:  66 year old male with right upper quadrant pain, shortness of breath  and generalized weakness for the past 4 days EXAM: CHEST  2 VIEW COMPARISON:  Prior chest x-ray 10/12/2013 FINDINGS: Interval removal of right IJ approach tunneled hemodialysis catheter. New patchy right lower lobe airspace opacity predominantly in the lower lobe. Additionally, there is atelectasis in the right middle lobe. Suspect a small layering pleural effusion as well. Stable borderline cardiomegaly. Probable trace chronic left pleural effusion. Mild vascular congestion without overt edema. No acute osseous abnormality. IMPRESSION: 1. New patchy airspace opacity in the right lower lobe concerning for pneumonia. Followup PA and lateral chest X-ray is recommended in 3-4 weeks following trial of antibiotic therapy to ensure resolution and exclude underlying malignancy. 2. Right middle lobe atelectasis. 3. Small right pleural effusion is likely parapneumonic. 4. Stable cardiomegaly and mild vascular congestion without overt edema. 5. Similar appearance of chronic small left pleural effusion. Electronically Signed   By: Jacqulynn Cadet M.D.   On: 09/05/2016 08:27  Dg Abdomen 1 View  Result Date: 09/02/2016 CLINICAL DATA:  Nausea and vomiting and generalized abdominal pain and cramping starting this evening. EXAM: ABDOMEN - 1 VIEW COMPARISON:  CT from 05/21/2011 FINDINGS: Moderate colonic stool burden within large bowel. No bowel obstruction or free air. No organomegaly. Phleboliths are seen in the pelvis. L4-5 and L5-S1 facet arthropathy. IMPRESSION: Moderate colonic stool burden.  No bowel obstruction. Electronically Signed   By: Ashley Royalty M.D.   On: 09/02/2016 23:47   Ct Chest Wo Contrast  Result Date: 09/27/2016 CLINICAL DATA:  Pt admitted 09/05/16 for PNA; pt has since developed pleural effusions; pt states that he "just had the fluid removed, and that it re-accumulated within 48 hours"; pt denies any chest pain EXAM: CT CHEST WITHOUT CONTRAST TECHNIQUE: Multidetector CT imaging of the chest was  performed following the standard protocol without IV contrast. COMPARISON:  Chest radiograph, 09/26/2016.  Chest CT, 09/07/2016. FINDINGS: Cardiovascular: Heart is normal in size. Great vessels are normal in caliber. No significant aortic atherosclerotic plaque. No significant coronary artery calcifications. Mediastinum/Nodes: No neck base or axillary masses or enlarged lymph nodes. Thyroid gland is unremarkable. No mediastinal masses or enlarged lymph nodes. No left hilar masses or adenopathy. Right hilum is partly obscured by contiguous lung opacity. Lungs/Pleura: Moderate right pleural effusion. There is atelectasis of the right lower lobe and partial atelectasis of the dependent right middle lobe. Some dependent secretions are noted in the bronchus intermedius and right lower lobe bronchi. There is mild subsegmental atelectasis in the dependent right upper lobe. Mild peribronchovascular linear and reticular opacities are noted in the left lower lobe there also likely atelectasis. No left pleural effusion. No pulmonary edema. No pneumothorax. Upper Abdomen: Trace amount of ascites adjacent to the liver. Otherwise unremarkable Musculoskeletal: No fracture or acute finding. No osteoblastic or osteolytic lesions. IMPRESSION: 1. Moderate right pleural effusion with complete atelectasis of the right lower lobe and partial atelectasis of the right middle lobe. Mild dependent subsegmental atelectasis noted in the dependent right upper lobe. No centrally obstructing mass is seen. Mucous/secretions are noted in the bronchus intermedius and right lower lobe segmental bronchi. 2. Mild subsegmental atelectasis in the left lower lobe. 3. No convincing pneumonia.  No pulmonary edema. Electronically Signed   By: Lajean Manes M.D.   On: 09/27/2016 11:58   Ct Chest Wo Contrast  Result Date: 09/07/2016 CLINICAL DATA:  66 year old male with hypoxia, fever and right-sided chest pain. Evaluate for pneumonia. EXAM: CT CHEST  WITHOUT CONTRAST TECHNIQUE: Multidetector CT imaging of the chest was performed following the standard protocol without IV contrast. COMPARISON:  Chest x-ray obtained earlier today ; prior CT scan of the abdomen and pelvis 09/03/2016 FINDINGS: Cardiovascular: Limited evaluation in the absence of intravenous contrast. Conventional 3 vessel arch anatomy. No evidence of aneurysm. The heart is normal in size. No pericardial effusion. The main pulmonary artery is borderline enlarged at 3.2 cm. Mediastinum/Nodes: Unremarkable CT appearance of the thyroid gland. No suspicious mediastinal or hilar adenopathy. No soft tissue mediastinal mass. The thoracic esophagus is unremarkable. Lungs/Pleura: Multifocal atelectasis involving the right middle and bilateral lower lobes. The degree of opacification of the right lower lobe is slightly greater than expected for atelectasis and a small amount of superimposed consolidation is difficult to exclude entirely. There is respiratory motion artifact which slightly limits evaluation. Trace left-sided pleural effusion. Upper Abdomen: Incompletely imaged gallbladder distension. Musculoskeletal: No acute fracture or aggressive appearing lytic or blastic osseous lesion. IMPRESSION: 1. Pulmonary findings are most  consistent with multifocal atelectasis involving the right middle, right lower and left lower lobes. The degree of opacity in the right lower lobe is slightly greater than expected for the amount of volume loss and therefore a superimposed infiltrate/pneumonia is difficult to exclude radiographically. 2. Trace left pleural effusion. 3. Borderline enlarged main pulmonary artery. Query clinical history of pulmonary arterial hypertension? 4. Incompletely imaged gallbladder appears distended. Does the patient have right upper quadrant pain? If there is clinical concern for cholecystitis, consider right upper quadrant ultrasound. Electronically Signed   By: Jacqulynn Cadet M.D.   On:  09/07/2016 14:10   Ir Perc Cholecystostomy  Result Date: 09/08/2016 INDICATION: ACUTE CHOLECYSTITIS, NON OPERATIVE CANDIDATE EXAM: CHOLECYSTOSTOMY MEDICATIONS: 3.375 G ZOSYN; The antibiotic was administered within an appropriate time frame prior to the initiation of the procedure. ANESTHESIA/SEDATION: Moderate (conscious) sedation was employed during this procedure. A total of Versed 1.0 mg and Fentanyl 25 mcg was administered intravenously. Moderate Sedation Time: 10 minutes. The patient's level of consciousness and vital signs were monitored continuously by radiology nursing throughout the procedure under my direct supervision. FLUOROSCOPY TIME:  Fluoroscopy Time:  48 seconds (10 mGy). COMPLICATIONS: None immediate. PROCEDURE: Informed written consent was obtained from the patient after a thorough discussion of the procedural risks, benefits and alternatives. All questions were addressed. Maximal Sterile Barrier Technique was utilized including caps, mask, sterile gowns, sterile gloves, sterile drape, hand hygiene and skin antiseptic. A timeout was performed prior to the initiation of the procedure. Previous imaging reviewed. Preliminary ultrasound performed. The abnormal gallbladder was localized in the right upper quadrant through a lower intercostal space. Overlying skin marked. Under sterile conditions and local anesthesia, percutaneous transhepatic needle access performed of the gallbladder under direct ultrasound. Needle position confirmed with ultrasound. There was return of bile. Guidewire inserted followed by the Accustick dilator set. Amplatz guidewire exchange performed. Tract dilatation performed to insert a 10 [place] drain. Drain catheter position confirmed with ultrasound and fluoroscopy. There was return of exudative bile. Sample sent for Gram stain and culture. Catheter secured with a Prolene suture and connected to external gravity drainage. Sterile dressing applied. No immediate complication.  IMPRESSION: Successful ultrasound and fluoroscopic 10 French transhepatic cholecystostomy. Electronically Signed   By: Jerilynn Mages.  Shick M.D.   On: 09/08/2016 12:14   Dg Chest 2v Repeat Same Day  Result Date: 09/23/2016 CLINICAL DATA:  Acute onset of central chest pain, shortness of breath, wheezing, productive cough, congestion and nausea. Initial encounter. EXAM: CHEST  2 VIEW COMPARISON:  Chest radiograph performed 09/14/2016 FINDINGS: The lungs are well-aerated. A large right-sided pleural effusion is noted, with right-sided airspace opacification, concerning for asymmetric pulmonary edema. There is no evidence of pneumothorax. The heart is borderline normal in size. No acute osseous abnormalities are seen. IMPRESSION: Large right-sided pleural effusion, with right-sided airspace opacification, concerning for asymmetric pulmonary edema. Electronically Signed   By: Garald Balding M.D.   On: 09/23/2016 19:36   Dg Chest Port 1 View  Result Date: 09/26/2016 CLINICAL DATA:  Pleural effusion EXAM: PORTABLE CHEST 1 VIEW COMPARISON:  Two days ago FINDINGS: Right pleural effusion that is moderate to large, increased from prior. Partly visible drain at the lower chest on the right is a cholecystostomy tube based on January 2018 abdominal CT. Normal heart size. Comparatively clear left lung. No pneumothorax. IMPRESSION: Moderate to large right pleural effusion. Fluid volume is mildly increased since 2 days ago. Electronically Signed   By: Monte Fantasia M.D.   On: 09/26/2016 07:59  Dg Chest Port 1 View  Result Date: 09/24/2016 CLINICAL DATA:  Pleural effusion EXAM: PORTABLE CHEST 1 VIEW COMPARISON:  09/23/2016 CXR, 09/07/2016 CT FINDINGS: Interval decrease in large right effusion/along the periphery of the right hemithorax. Right basilar atelectasis is identified. Chronic elevation of the right hemidiaphragm. Heart is top-normal in size. Aortic atherosclerosis is noted. There is mild pulmonary vascular congestion.  There is osteoarthritis about both AC and glenohumeral joints. IMPRESSION: Interval decrease in right effusion. Right basilar atelectasis. Aortic atherosclerosis. There is mild vascular congestion consistent with mild CHF. Electronically Signed   By: Ashley Royalty M.D.   On: 09/24/2016 18:06   Dg Chest Port 1 View  Result Date: 09/14/2016 CLINICAL DATA:  Followup pleural effusion. EXAM: PORTABLE CHEST 1 VIEW COMPARISON:  09/13/2016.  Chest CT dated 09/07/2016. FINDINGS: The right jugular catheter is unchanged. Elevation of the right hemidiaphragm and right pleural fluid are unchanged. Mild left lower lobe atelectasis. Stable lower thoracic spine degenerative changes. IMPRESSION: 1. Stable elevated right hemidiaphragm and right pleural fluid. 2. Mild left lower lobe atelectasis. Electronically Signed   By: Claudie Revering M.D.   On: 09/14/2016 08:46   Dg Chest Port 1 View  Result Date: 09/13/2016 CLINICAL DATA:  Follow-up pleural effusion. EXAM: PORTABLE CHEST 1 VIEW COMPARISON:  09/12/2016 FINDINGS: Right internal jugular central line tip is in the SVC at the azygos level. The left lung remains clear except for mild left base atelectasis. Right lower and middle lobe collapse persists. Probable subpulmonic effusion on the right. IMPRESSION: Only mild residual left base atelectasis. Persistent collapse of the right lower and middle lobes, probably with right subpulmonic effusion. Electronically Signed   By: Nelson Chimes M.D.   On: 09/13/2016 07:29   Dg Chest Port 1 View  Result Date: 09/12/2016 CLINICAL DATA:  Respiratory failure. EXAM: PORTABLE CHEST 1 VIEW COMPARISON:  09/11/2016 . FINDINGS: Right IJ line in stable position. Stable cardiomegaly. Improved pulmonary interstitial edema. Persistent right base atelectasis. Improved left mid lung field subsegmental atelectasis. Small right pleural effusion . No pneumothorax. IMPRESSION: 1. Right IJ line stable position. 2. Stable mild cardiomegaly. Improvement of  pulmonary interstitial edema. Small right pleural effusion. 3. Persistent right base atelectasis. Improvement of left mid lung field atelectasis . Electronically Signed   By: Marcello Moores  Register   On: 09/12/2016 07:10   Dg Chest Port 1 View  Result Date: 09/11/2016 CLINICAL DATA:  Hemodialysis catheter insertion, assess tip position ; patient's electronic health record is not available at time of this interpretation EXAM: PORTABLE CHEST 1 VIEW COMPARISON:  Portable exam 1534 hours compared 09/11/2016 FINDINGS: Tip of RIGHT jugular central venous catheter projects over SVC at the level of the carina. Upper normal heart size. Slight pulmonary vascular congestion. Mediastinal contours normal. Improved pulmonary edema with persistent atelectasis versus consolidation at RIGHT lung base. Associated RIGHT pleural effusion. No pneumothorax following central line insertion. IMPRESSION: No pneumothorax following central line placement. Improved pulmonary edema with persistent atelectasis versus consolidation and pleural effusion at RIGHT lung base. Electronically Signed   By: Lavonia Dana M.D.   On: 09/11/2016 15:46   Dg Chest Port 1 View  Result Date: 09/11/2016 CLINICAL DATA:  Onset epigastric pain today.  Wheezing. EXAM: PORTABLE CHEST 1 VIEW COMPARISON:  09/09/2016 FINDINGS: Diffuse bilateral airspace disease, right greater than left, most confluent in the right lower lung. While this could reflect edema, cannot exclude pneumonia. Heart is borderline in size. Probable right effusion. IMPRESSION: Bilateral airspace disease, right greater than left, most  confluent in the right base. Edema versus infection. Electronically Signed   By: Rolm Baptise M.D.   On: 09/11/2016 11:13   Dg Chest Port 1 View  Result Date: 09/09/2016 CLINICAL DATA:  Shortness of breath, wheezing. EXAM: PORTABLE CHEST 1 VIEW COMPARISON:  Radiograph August 07, 2017. FINDINGS: Stable cardiomediastinal silhouette. No pneumothorax is noted. Stable  left midlung atelectasis is noted. Stable right basilar opacity is noted concerning for atelectasis or infiltrate. Bony thorax is unremarkable. IMPRESSION: Stable left midlung and right basilar opacities are noted concerning for atelectasis or possibly infiltrate. Electronically Signed   By: Marijo Conception, M.D.   On: 09/09/2016 09:50   Dg Chest Port 1 View  Result Date: 09/07/2016 CLINICAL DATA:  Hypoxia EXAM: PORTABLE CHEST 1 VIEW COMPARISON:  Chest radiograph and chest CT September 07, 2016 FINDINGS: There is persistent patchy airspace consolidation in the left mid lung and right base regions. There is no evident new opacity compared to earlier in the day. Heart is mildly enlarged with pulmonary vascularity within normal limits. There is atherosclerotic calcification in the aorta. There is stable eventration of the right hemidiaphragm. No adenopathy. No bone lesions. IMPRESSION: Areas of airspace consolidation in the left mid lung and right base regions. No new opacity. Stable cardiac silhouette. There is aortic atherosclerosis. Electronically Signed   By: Lowella Grip III M.D.   On: 09/07/2016 21:45   Dg Chest Port 1 View  Result Date: 09/07/2016 CLINICAL DATA:  Fever, hypoxia.  Subsequent encounter. EXAM: PORTABLE CHEST 1 VIEW COMPARISON:  09/05/2016 FINDINGS: There is opacity at the right lung base which is similar to the prior exam allowing for differences in patient positioning and technique. Mild linear scarring or atelectasis in the left mid lung, stable. Remainder of the lungs is clear. Elevation right hemidiaphragm is stable. No obvious pleural effusion. No pneumothorax. IMPRESSION: 1. Right base opacity is similar to the prior exam. Although this may be atelectasis only, pneumonia should be considered likely given the symptoms of fever and hypoxia. 2. No significant change from the prior exam allowing for differences in positioning and technique. Electronically Signed   By: Lajean Manes M.D.    On: 09/07/2016 09:18   Dg Abd 2 Views  Result Date: 09/05/2016 CLINICAL DATA:  Abdominal distention EXAM: ABDOMEN - 2 VIEW COMPARISON:  CT abdomen and pelvis September 03, 2016 FINDINGS: Supine and upright images obtained. There is contrast in the colon. There is no bowel dilatation or air-fluid level suggesting bowel obstruction. No free air. There are phleboliths in the pelvis. IMPRESSION: Contrast in colon.  No bowel obstruction or free air evident. Electronically Signed   By: Lowella Grip III M.D.   On: 09/05/2016 11:47   Dg Abd Portable 1v  Result Date: 09/10/2016 CLINICAL DATA:  Abdominal pain.  Cholecystostomy. EXAM: PORTABLE ABDOMEN - 1 VIEW COMPARISON:  CT 09/07/2016 FINDINGS: Pigtail catheter right upper quadrant in satisfactory position most likely within the gallbladder. Normal bowel gas pattern. No obstruction or ileus. Small amount of contrast or ingested tablets in the stomach. IMPRESSION: Cholecystostomy tube in good position.  Normal bowel gas pattern. Electronically Signed   By: Franchot Gallo M.D.   On: 09/10/2016 15:38   US Thoracentesis Asp Pleural Space W/img Guide  Result Date: 09/28/2016 INDICATION: Patient with recent cholecystitis and right-sided pneumonia. Prior thoracentesis earlier this week. Recurrent right pleural effusion. Request is made for diagnostic and therapeutic thoracentesis. EXAM: ULTRASOUND GUIDED DIAGNOSTIC AND THERAPEUTIC THORACENTESIS MEDICATIONS: 1% lidocaine COMPLICATIONS: None immediate.  PROCEDURE: An ultrasound guided thoracentesis was thoroughly discussed with the patient and questions answered. The benefits, risks, alternatives and complications were also discussed. The patient understands and wishes to proceed with the procedure. Written consent was obtained. Ultrasound was performed to localize and mark an adequate pocket of fluid in the right chest. The area was then prepped and draped in the normal sterile fashion. 1% Lidocaine was used for local  anesthesia. Under ultrasound guidance a Safe-T-Centesis catheter was introduced. Thoracentesis was performed. The catheter was removed and a dressing applied. FINDINGS: A total of approximately 0.68 L of bloody fluid was removed. Samples were sent to the laboratory as requested by the clinical team. IMPRESSION: Successful ultrasound guided right thoracentesis yielding 0.68 L of pleural fluid. Read by: Saverio Danker, PA-C Electronically Signed   By: Markus Daft M.D.   On: 09/28/2016 11:44     Tayjon Halladay M.D on 09/28/2016 at 2:15 PM  Between 7am to 7pm - Pager - [phone]  After 7pm go to [email or web address] - password [facility]  [facility] -  Office  [phone]

## 2016-09-28 NOTE — Procedures (Signed)
Ultrasound-guided diagnostic and therapeutic right thoracentesis performed yielding 0.68 liters of bloody colored fluid. No immediate complications. Follow-up chest x-ray pending.       Deissy Guilbert E 11:28 AM 09/28/2016

## 2016-09-29 ENCOUNTER — Inpatient Hospital Stay (HOSPITAL_COMMUNITY): Payer: PPO

## 2016-09-29 DIAGNOSIS — L8990 Pressure ulcer of unspecified site, unspecified stage: Secondary | ICD-10-CM | POA: Insufficient documentation

## 2016-09-29 LAB — CBC
HCT: 23.9 % — ABNORMAL LOW (ref 39.0–52.0)
Hemoglobin: 7.9 g/dL — ABNORMAL LOW (ref 13.0–17.0)
MCH: 30.5 pg (ref 26.0–34.0)
MCHC: 33.1 g/dL (ref 30.0–36.0)
MCV: 92.3 fL (ref 78.0–100.0)
PLATELETS: 255 10*3/uL (ref 150–400)
RBC: 2.59 MIL/uL — AB (ref 4.22–5.81)
RDW: 16.2 % — ABNORMAL HIGH (ref 11.5–15.5)
WBC: 6.7 10*3/uL (ref 4.0–10.5)

## 2016-09-29 LAB — GLUCOSE, CAPILLARY
Glucose-Capillary: 130 mg/dL — ABNORMAL HIGH (ref 65–99)
Glucose-Capillary: 180 mg/dL — ABNORMAL HIGH (ref 65–99)

## 2016-09-29 LAB — PROTIME-INR
INR: 1.95
Prothrombin Time: 22.5 seconds — ABNORMAL HIGH (ref 11.4–15.2)

## 2016-09-29 LAB — RENAL FUNCTION PANEL
ALBUMIN: 1.7 g/dL — AB (ref 3.5–5.0)
Anion gap: 10 (ref 5–15)
BUN: 41 mg/dL — AB (ref 6–20)
CO2: 28 mmol/L (ref 22–32)
CREATININE: 3.58 mg/dL — AB (ref 0.61–1.24)
Calcium: 8.4 mg/dL — ABNORMAL LOW (ref 8.9–10.3)
Chloride: 98 mmol/L — ABNORMAL LOW (ref 101–111)
GFR calc Af Amer: 19 mL/min — ABNORMAL LOW (ref >60)
GFR calc non Af Amer: 16 mL/min — ABNORMAL LOW (ref >60)
GLUCOSE: 168 mg/dL — AB (ref 65–99)
PHOSPHORUS: 4.1 mg/dL (ref 2.5–4.6)
Potassium: 4.2 mmol/L (ref 3.5–5.1)
SODIUM: 136 mmol/L (ref 135–145)

## 2016-09-29 LAB — TRIGLYCERIDES, BODY FLUIDS: TRIGLYCERIDES FL: 79 mg/dL

## 2016-09-29 LAB — PROCALCITONIN: Procalcitonin: 0.67 ng/mL

## 2016-09-29 LAB — PH, BODY FLUID: PH, BODY FLUID: 7.5

## 2016-09-29 MED ORDER — LEVALBUTEROL HCL 1.25 MG/0.5ML IN NEBU
1.2500 mg | INHALATION_SOLUTION | Freq: Four times a day (QID) | RESPIRATORY_TRACT | Status: DC | PRN
Start: 2016-09-29 — End: 2016-09-29

## 2016-09-29 MED ORDER — WARFARIN SODIUM 2.5 MG PO TABS: 2.5000 mg | ORAL_TABLET | Freq: Every day | ORAL | 0 refills | Status: DC

## 2016-09-29 MED ORDER — INSULIN GLARGINE 100 UNIT/ML SOLOSTAR PEN: 35.0000 [IU] | PEN_INJECTOR | Freq: Every morning | SUBCUTANEOUS | Status: DC

## 2016-09-29 MED ORDER — IPRATROPIUM BROMIDE 0.02 % IN SOLN: 0.5000 mg | Freq: Four times a day (QID) | RESPIRATORY_TRACT | Status: DC | PRN

## 2016-09-29 MED ORDER — FERROUS SULFATE 325 (65 FE) MG PO TABS: 325.0000 mg | ORAL_TABLET | Freq: Every day | ORAL | 3 refills | Status: DC

## 2016-09-29 MED ORDER — WARFARIN SODIUM 5 MG PO TABS
5.0000 mg | ORAL_TABLET | Freq: Once | ORAL | Status: AC
  Administered 2016-09-29: 5 mg via ORAL
  Filled 2016-09-29: qty 1

## 2016-09-29 MED ORDER — NEPRO/CARBSTEADY PO LIQD: 237.0000 mL | Freq: Two times a day (BID) | ORAL | 0 refills | Status: DC

## 2016-09-29 MED ORDER — SENNOSIDES-DOCUSATE SODIUM 8.6-50 MG PO TABS: 1.0000 | ORAL_TABLET | Freq: Every day | ORAL | 0 refills | Status: DC

## 2016-09-29 NOTE — Progress Notes (Signed)
ANTICOAGULATION CONSULT NOTE - Follow Up Consult  Pharmacy Consult for Warfarin Indication: atrial fibrillation  Patient Measurements: Height: 6' 2.5" (189.2 cm) Weight: 218 lb 14.4 oz (99.3 kg) IBW/kg (Calculated) : 83.35   Labs:  Recent Labs  09/27/16 0556 09/28/16 0501 09/29/16 1300  HGB 8.4* 7.4* 7.9*  HCT 25.9* 23.5* 23.9*  PLT 189 211 255  LABPROT 23.6* 21.8* 22.5*  INR 2.07 1.87 1.95  CREATININE 4.04* 3.72* 3.58*    Estimated Creatinine Clearance: 23.9 mL/min (by C-G formula based on SCr of 3.58 mg/dL (H)).   Assessment: 29 YOM with new onset Afib with RVR.  The patient is s/p heparin drip. Oral anticoagulation was previously held pending possible surgical plans for cholecystitis. There is no immediate surgery planned so pharmacy was consulted to add new warfarin starting 1/15.   INR 1.95 subtherapeutic today, but slightly increased from 1.85 yesterday. -Hgb 7.9, Plts 255 -No bleeding noted  Goal of Therapy:  INR 2-3 Monitor platelets by anticoagulation protocol: Yes   Plan:  1. Warfarin 5 mg x 1 dose at 1800 today 2. Will continue to monitor for any signs/symptoms of bleeding and will follow up with PT/INR in the a.m.   Thank you for allowing pharmacy to be a part of this patient's care.  Myer Peer Grayland Ormond), PharmD  [id number] Pharmacy Resident Pager: [phone] 09/29/2016 2:09 PM

## 2016-09-29 NOTE — Discharge Summary (Signed)
Gary Lang, is a 66 y.o. male  DOB 1950-09-21  MRN [id number].  Admission date:  09/05/2016  Admitting Physician  Dron Tanna Furry, MD  Discharge Date:  09/29/2016   Primary MD  [name], MD  Recommendations for primary care physician for things to follow:  - Patient will need repeat CBC, BMP is urine next visit with PCP, family instructed to make an appointment within 3 days, will check INR and adjust warfarin dose as needed, and to repeat 2 view chest x-ray during next visit to ensure there is no recurrence of pleural effusion. - Patient to follow with general surgery regarding laparoscopic cholecystectomy as an outpatient, appointment has been scheduled. - To follow with IR regarding percutaneous cholecystostomy tube, IR will call with an appointment - Follow with pulmonary critical care as an outpatient regarding further management for pleural effusion. - To follow with cardiology, appointment scheduled for next week - Home care has been arranged for PT, and visiting nurse percutaneous drain care   Admission Diagnosis  Hypoxia [R09.02] Elevated troponin [R74.8] AKI (acute kidney injury) ([place]) [N17.9] Abdominal distention, non-gaseous [R14.0] Sepsis, due to unspecified organism ([place]) [A41.9] Fever, unspecified fever cause [R50.9] Community acquired pneumonia of right lower lobe of lung ([place]) [J18.1]   Discharge Diagnosis  Hypoxia [R09.02] Elevated troponin [R74.8] AKI (acute kidney injury) ([place]) [N17.9] Abdominal distention, non-gaseous [R14.0] Sepsis, due to unspecified organism ([place]) [A41.9] Fever, unspecified fever cause [R50.9] Community acquired pneumonia of right lower lobe of lung ([place]) [J18.1]    Active Problems:   Community acquired pneumonia of right lower lobe of lung ([place])   Acute respiratory failure with hypoxia ([place])   Acute cholecystitis   AKI (acute kidney injury)  ([place])   Abdominal distention, non-gaseous   Acute abdominal pain   Fever   PAF (paroxysmal atrial fibrillation) ([facility])   Pleural effusion   Pressure injury of skin      Past Medical History:  Diagnosis Date  . Chronic kidney disease   . Diabetes mellitus without complication ([place])   . Hypertension   . Rheumatoid arthritis ([place])   . Vitamin D deficiency    Hx: of    Past Surgical History:  Procedure Laterality Date  . AV FISTULA PLACEMENT Left 06/28/2013   Procedure: ARTERIOVENOUS (AV) FISTULA CREATION- Left arm with ultrasound guidance;  Surgeon: Rosetta Posner, MD;  Location: [facility] OR;  Service: Vascular;  Laterality: Left;  . COLONOSCOPY     hx: of  . HERNIA REPAIR    . INSERTION OF DIALYSIS CATHETER N/A 06/28/2013   Procedure: INSERTION OF DIALYSIS CATHETER;  Surgeon: Rosetta Posner, MD;  Location: [facility];  Service: Vascular;  Laterality: N/A;  . IR GENERIC HISTORICAL  09/08/2016   IR PERC CHOLECYSTOSTOMY 09/08/2016 [name], MD [facility]-INTERV RAD  . LIGATION OF COMPETING BRANCHES OF ARTERIOVENOUS FISTULA Left 10/12/2013   Procedure: LIGATION OF COMPETING BRANCHES OF ARTERIOVENOUS FISTULA;  Surgeon: Elam Dutch, MD;  Location: [facility];  Service: Vascular;  Laterality: Left;  .  neck surgery         History of present illness and  Hospital Course:     Kindly see H&P for history of present illness and admission details, please review complete Labs, Consult reports and Test reports for all details in brief  HPI  from the history and physical done on the day of admission 09/05/2016  HPI: Gary Lang is a 66 y.o. male with medical history significant of chronic kidney disease, diabetes, hypertension, rheumatoid arthritis, ex-smoker quit about 20 years ago presented with fever up to 102 at home associated with chills and right-sided chest pain. Patient visited a few days ago to ER with right-sided chest and abdominal pain when CT scan of abdomen and pelvis was unremarkable and patient  was discharged home. Patient came with worsening symptoms. Denied sick contact or recent travel. Reported sore throat, shortness of breath however denied cough or chest pain. He reports the pain is mostly on right side of the abdomen, mostly presented with occasional worsening. Not associated with nausea vomiting or diarrhea. Last bowel movement was 3 days ago which was hard stool. Also reported generalized weakness and decreased oral intake. Denied urinary symptoms. ED Course: In the ER patient was found to have right lower lobe pneumonia treated with ceftriaxone and azithromycin. Sepsis protocol. Also found to have acute on chronic kidney disease. Mildly positive troponin. Admitted for further evaluation.  Hospital Course  66 y.o.malewith PMH as outlined below including biopsy proven variant of FSGS with subsequent CKD III. He was admitted 09/05/16 with E.Coli bacteremia due to acute cholecystitis. He was seen by [facility] and had percutaneous drain placed 01/09 as [facility] felt he was not stable enough for full surgery. Since admission, he has had worsening in renal function and also developed new onset paroxysmal atrial fib. On 1/10 Due to worsening renal function started on CVVHD (prefered over HD due to HR). As well patient with recurrent right pleural effusion,seen by PCCM.   Sepsis due to E Coli bacteremia and possibly commonly acquired pneumonia/acute cholecystitis: - Chest x-ray showed right lower lobe pneumonia. Patient with tachycardia, leukocytosis, fever and abnormal x-ray finding on admission. - 2 out of 2 blood cultures + Escherichia coli. Drain culture with ESCHERICHIA COLI and KLEBSIELLA PNEUMONIAE - S/p perc cholecystostomy 09/08/16.per IRneedsOP follow-up in IR clinic , they will arrange for a follow-up - general surgery oked to start coumadin, patient is to follow up with general surgery in a few weeks for elective laparoscopic cholecystectomy , they have arranged for follow-up - sepsis  resolving, finished 10 days of iv abx, d/c abx on 1/14, repeat blood culture no growth  Acute hypoxic respiratory failure likely in the setting of pneumonia and sepsis: - Finished abx treatment on 1/14, Improved, on room air.   Right pleural effusion - Exudative, recurrent, status post 1/23, with significant leukocytosis and elevated lactic acid, repeat CT chest 1/26 with moderate effusion left mid/lower lobe atelectasis, went again today for the thoracentesis 1/27, which still looks exudative,. - No growth to date on pleural fluid 1/23 - Discussed with pulmonary Dr Melvyn Novas, a shunt can be discharged today, close follow-up with repeat x-ray in few days at PCP, and to follow with him on that as an outpatient, free accumulates then he will need Pleurx versus VATS, patient was encouraged to use incentive spirometry given atelectasis.  Blood loss anemia - resolved with discontinuation of heparin and aspirin. Currently patient on coumadin. - hemoglobin is 7.9 today, will discharge and iron supplements.  See Diovan in the hospital, we'll continue Aranesp outpatient.  Acute on chronic kidney disease, history of FSGN , has AVF in place,  - Renal input greatly appreciated, required CRRT briefly,  New onset atrial fibrillation -Echocardiogram showed vigorous left ventricular systolic function with EF of 65-30%. There were no regional wall motion abnormalities. Also with grade 1 diastolic dysfunction. Converted to sinus rhythm, currently on ccb - Both general surgery and cardiology oked with transition from heparin drip to Coumadin on 1/15 - Cardiology signed off on 1/15, outpatient follow up recommended, appointment has been scheduled he is new on coumadin, coumadin teaching, patient needs to have outpatient coumadin level checked. - Patient instructed to follow with his PCP in 3 days for INR follow-up and warfarin dose adjustment if needed  Uncontrolled diabetes, insulin dependent - Required  insulin drip initially -   start on Lantus and NovoLog - Hemoglobin a1c 9.4  HTN; - Start on home medication  Physical deconditioning Discharge with home PT    Discharge Condition:  Stable discussed with wife via phone, explained for her the follow-up appointments, and importance to follow with PCP within 3 days regarding chest x-ray and INR check   Follow UP  Follow-up Information    [name], MD Follow up.   Specialty:  Interventional Radiology Why:  pt will hear from OP IR drain clinic scheduler for recheck time and date---call [phone] if questions or concerns Contact information: [address] [place] [address] [phone]        Call Maia Petties., MD.   Specialty:  General Surgery Why:  Appointment Date: 10/15/2016 '@9'$ :Stasia Cavalier. Please arrive at 9:20a for paperwork. Thank you.  Contact information: [place] [address] [phone]        Minus Breeding, MD Follow up.   Specialty:  Cardiology Why:  Appointment on 10/03/2016 '@8'$ :30a with PA Rosaria Ferries. Please arrive at least 15 minutes early for paperwork. Thank You. Contact information: [address] [place] [address] [phone]        [name], MD Follow up.   Specialty:  Internal Medicine Why:  Please make appointment in 3 days for INR follow-up and 2 view chest x-ray. Contact information: [address] [place] [address] [phone]        RAMASWAMY,MURALI, MD Follow up in 1 week(s).   Specialty:  Pulmonary Disease Contact information: [place] [address] [phone]             Discharge Instructions  and  Discharge Medications    Discharge Instructions    Call MD for:  redness, tenderness, or signs of infection (pain, swelling, redness, odor or green/yellow discharge around incision site)    Complete by:  As directed    Diet - low sodium heart healthy    Complete by:  As  directed    Discharge instructions    Complete by:  As directed    Follow with Primary MD [name], MD in 3 days   Get CBC, CMP,INR,  2 view Chest X ray checked  by Primary MD next visit.    Activity: As tolerated with Full fall precautions use walker/cane & assistance as needed   Disposition Home    Diet: Heart Healthy , carbohydrate modified with 1500 mL fluid restrictions , with feeding assistance and aspiration precautions.  For Heart failure patients - Check your Weight same time everyday, if you gain over 2  pounds, or you develop in leg swelling, experience more shortness of breath or chest pain, call your Primary MD immediately. Follow Cardiac Low Salt Diet and 1.5 lit/day fluid restriction.   On your next visit with your primary care physician please Get Medicines reviewed and adjusted.   Please request your Prim.MD to go over all Hospital Tests and Procedure/Radiological results at the follow up, please get all Hospital records sent to your Prim MD by signing hospital release before you go home.   If you experience worsening of your admission symptoms, develop shortness of breath, life threatening emergency, suicidal or homicidal thoughts you must seek medical attention immediately by calling 911 or calling your MD immediately  if symptoms less severe.  You Must read complete instructions/literature along with all the possible adverse reactions/side effects for all the Medicines you take and that have been prescribed to you. Take any new Medicines after you have completely understood and accpet all the possible adverse reactions/side effects.   Do not drive, operating heavy machinery, perform activities at heights, swimming or participation in water activities or provide baby sitting services if your were admitted for syncope or siezures until you have seen by Primary MD or a Neurologist and advised to do so again.  Do not drive when taking Pain medications.    Do not  take more than prescribed Pain, Sleep and Anxiety Medications  Special Instructions: If you have smoked or chewed Tobacco  in the last 2 yrs please stop smoking, stop any regular Alcohol  and or any Recreational drug use.  Wear Seat belts while driving.   Please note  You were cared for by a hospitalist during your hospital stay. If you have any questions about your discharge medications or the care you received while you were in the hospital after you are discharged, you can call the unit and asked to speak with the hospitalist on call if the hospitalist that took care of you is not available. Once you are discharged, your primary care physician will handle any further medical issues. Please note that NO REFILLS for any discharge medications will be authorized once you are discharged, as it is imperative that you return to your primary care physician (or establish a relationship with a primary care physician if you do not have one) for your aftercare needs so that they can reassess your need for medications and monitor your lab values.   Increase activity slowly    Complete by:  As directed    Increase activity slowly    Complete by:  As directed      Allergies as of 09/29/2016      Reactions   Glyburide Other (See Comments)   unknown   Methotrexate Derivatives Other (See Comments)   Patient cannot remember      Medication List    STOP taking these medications   aspirin 81 MG EC tablet   diltiazem 180 MG 24 hr capsule Commonly known as:  DILACOR XR   loperamide 2 MG capsule Commonly known as:  IMODIUM   sildenafil 50 MG tablet Commonly known as:  VIAGRA     TAKE these medications   acetaminophen 500 MG tablet Commonly known as:  TYLENOL Take 2 tablets (1,000 mg total) by mouth 3 (three) times daily as needed for pain.   atorvastatin 40 MG tablet Commonly known as:  LIPITOR Take 1 tablet by mouth daily.   calcium acetate 667 MG capsule Commonly known as:  PHOSLO Take  1  capsule (667 mg total) by mouth 3 (three) times daily with meals.   cholecalciferol 1000 units tablet Commonly known as:  VITAMIN D Take 1,000 Units by mouth daily.   cloNIDine 0.3 MG tablet Commonly known as:  CATAPRES Take 1 tablet (0.3 mg total) by mouth 3 (three) times daily. What changed:  medication strength  how much to take  when to take this   dicyclomine 20 MG tablet Commonly known as:  BENTYL Take 1 tablet (20 mg total) by mouth 2 (two) times daily as needed for spasms.   diltiazem 240 MG 24 hr capsule Commonly known as:  CARDIZEM CD Take 1 capsule (240 mg total) by mouth daily.   feeding supplement (NEPRO CARB STEADY) Liqd Take 237 mLs by mouth 2 (two) times daily between meals. Start taking on:  09/30/2016   ferrous sulfate 325 (65 FE) MG tablet Commonly known as:  FERROUSUL Take 1 tablet (325 mg total) by mouth daily with breakfast.   FISH OIL PO Take 1 capsule by mouth daily.   furosemide 40 MG tablet Commonly known as:  LASIX Take 1 tablet (40 mg total) by mouth 2 (two) times daily. What changed:  medication strength  how much to take  when to take this   insulin aspart 100 UNIT/ML FlexPen Commonly known as:  NOVOLOG FLEXPEN Inject 7.5 Units into the skin 3 (three) times daily with meals. What changed:  how much to take   Insulin Glargine 100 UNIT/ML Solostar Pen Commonly known as:  LANTUS Inject 35 Units into the skin every morning. Increase 5 u if am CBG is greater than 200.  Decrease 5 u if am CBG is less than 150 What changed:  how much to take   ondansetron 4 MG disintegrating tablet Commonly known as:  ZOFRAN ODT Take 1 tablet (4 mg total) by mouth every 8 (eight) hours as needed for nausea or vomiting.   UNIFINE PENTIPS 31G X 5 MM Misc Generic drug:  Insulin Pen Needle USE AS DIRECTED WITH INSULIN   Pen Needles 31G X 6 MM Misc Use 1 needle per injection as directed   senna-docusate 8.6-50 MG tablet Commonly known as:   Senokot-S Take 1 tablet by mouth at bedtime.   sodium bicarbonate 650 MG tablet Take 2 tablets (1,300 mg total) by mouth 3 (three) times daily.   TRULICITY 1.5 ZO/1.0RU Sopn Generic drug:  Dulaglutide Inject 1 Units as directed once a week. Patient isn't sure how many units he takes. Patient takes on Wednesday's.   warfarin 2.5 MG tablet Commonly known as:  COUMADIN Take 1 tablet (2.5 mg total) by mouth daily.         Diet and Activity recommendation: See Discharge Instructions above   Consults obtained -  Renal radiology, pulmonary, interventional radiology  Major procedures and Radiology Reports - PLEASE review detailed and final reports for all details, in brief -   Ultrasound thoracentesis 1/23, and 1/26   Ct Abdomen Pelvis Wo Contrast  Result Date: 09/11/2016 CLINICAL DATA:  Left lower quadrant abdominal pain. EXAM: CT ABDOMEN AND PELVIS WITHOUT CONTRAST TECHNIQUE: Multidetector CT imaging of the abdomen and pelvis was performed following the standard protocol without IV contrast. COMPARISON:  09/07/2016 FINDINGS: Lower chest: Moderate right pleural effusion, increased since prior study. Consolidation in the right lower lobe. Left base atelectasis. Heart is normal size. Hepatobiliary: Interval placement of a cholecystostomy drainage catheter into the gallbladder with decompression of the gallbladder. The subcapsular fluid collection along the inferior right  hepatic surface has decreased in size since prior study. Pancreas: No focal abnormality or ductal dilatation. Spleen: No focal abnormality.  Normal size. Adrenals/Urinary Tract: Perinephric stranding around the kidneys bilaterally, stable. Left renal cyst is also stable. No hydronephrosis. Adrenal glands are unremarkable. Foley catheter is present in the bladder which is decompressed. Stomach/Bowel: Colonic diverticulosis. No active diverticulitis. Stomach and small bowel are decompressed, unremarkable. Vascular/Lymphatic: No  evidence of aneurysm or adenopathy. Reproductive: No visible focal abnormality. Other: Small amount of free fluid in the pelvis and adjacent to the liver. No free air. Large left inguinal hernia containing fat, stable. Musculoskeletal: No acute bony abnormality or focal bone lesion. IMPRESSION: Interval placement of cholecystostomy drainage catheter into the gallbladder with decompression. The subcapsular fluid collection along the right hepatic margin has decreased in size as well. Small amount of free fluid in the pelvis and adjacent to the liver. Increasing moderate right pleural effusion with right low consolidation. Cannot exclude pneumonia. Left base atelectasis. Left inguinal hernia containing fat. Electronically Signed   By: Rolm Baptise M.D.   On: 09/11/2016 12:20   Ct Abdomen Pelvis Wo Contrast  Result Date: 09/07/2016 CLINICAL DATA:  66 year old male with history of sepsis due to E coli bacteremia. Pneumonia. Acute on chronic kidney disease. EXAM: CT ABDOMEN AND PELVIS WITHOUT CONTRAST TECHNIQUE: Multidetector CT imaging of the abdomen and pelvis was performed following the standard protocol without IV contrast. COMPARISON:  CT the abdomen and pelvis 09/03/2016. FINDINGS: Lower chest: Multiple new areas of consolidation and volume loss throughout the lung bases bilaterally, most severe in the lower lobes (right greater than left), concerning for sequela of recent aspiration. Trace bilateral pleural effusions. Hepatobiliary: New compared to the prior examination is a subhepatic fluid collection and beneath the right lobe of the liver which is likely to be subcapsular. This is best appreciated on sagittal image 31 of series 6 and coronal image 56 of series 5 where it measures 10.6 x 3.8 x 8.6 cm. No other definite intrahepatic lesions are identified on today's noncontrast CT examination. Small focus of intermediate to high attenuation lying dependently in the gallbladder likely represents a noncalcified  gallstone. Small amount of pericholecystic fluid. Haziness and inflammatory changes in the soft tissues surrounding the gallbladder. Gallbladder appears moderately distended. Pancreas: No definite pancreatic mass or peripancreatic inflammatory changes are noted on today's noncontrast CT examination. Spleen: Unremarkable. Adrenals/Urinary Tract: 4.5 cm low-attenuation lesion in the lateral aspect of the interpolar region of the left kidney is incompletely characterized, but previously characterized as a simple cyst on prior study 05/21/2011. Right kidney and bilateral adrenal glands are normal in appearance. No hydroureteronephrosis. Urinary bladder is nearly completely decompressed around an indwelling Foley balloon catheter. Small amount of gas non dependently in the lumen of the urinary bladder is iatrogenic. Stomach/Bowel: Unenhanced appearance of the stomach is normal. There is no pathologic dilatation of small bowel or colon. Small duodenal diverticulum extending off the medial aspect of the second portion of the duodenum incidentally noted. No surrounding inflammatory changes to suggest an associated diverticulitis at this time. There also a few scattered colonic diverticulae, also without surrounding inflammatory changes to suggest an acute diverticulitis at this time. Normal appendix. Vascular/Lymphatic: Atherosclerotic calcifications in the abdominal aorta (mild), without evidence of aneurysm. No lymphadenopathy noted in the abdomen or pelvis on today's noncontrast CT examination. Reproductive: Prostate gland seminal vesicles are unremarkable in appearance. Other: Moderate sized left inguinal hernia containing only fat. No significant volume of ascites. No pneumoperitoneum. Musculoskeletal: There  are no aggressive appearing lytic or blastic lesions noted in the visualized portions of the skeleton. IMPRESSION: 1. Interval development of extensive pericholecystic fluid. There is a small noncalcified gallstone  or sludge ball near the neck of the gallbladder. Notably, there are also inflammatory changes around the gallbladder and the pericholecystic fluid appears contiguous with a subhepatic fluid collection beneath the right lobe of the liver, which appears to be likely subcapsular in location. Clinical correlation for signs and symptoms of acute cholecystitis is strongly recommended. 2. The appearance of the lower thorax suggests sequela of recent aspiration with multifocal aspiration pneumonitis/pneumonia. 3. Aortic atherosclerosis. 4. Additional incidental findings, as above. These results will be called to the ordering clinician or representative by the Radiologist Assistant, and communication documented in the PACS or zVision Dashboard. Electronically Signed   By: Vinnie Langton M.D.   On: 09/07/2016 16:52   Ct Abdomen Pelvis Wo Contrast  Result Date: 09/03/2016 CLINICAL DATA:  Severe abdominal pain for 8 hours, cramping. Vomiting. History of hypertension, diabetes, cholecystectomy and appendectomy. EXAM: CT ABDOMEN AND PELVIS WITHOUT CONTRAST TECHNIQUE: Multidetector CT imaging of the abdomen and pelvis was performed following the standard protocol without IV contrast. Oral contrast administered. COMPARISON:  Abdominal radiographs September 02, 2016 and CT abdomen and pelvis May 21, 2011 FINDINGS: LOWER CHEST: Dependent atelectasis. The visualized heart size is normal. No pericardial effusion. Contrast in the distal esophagus may be retained or refluxed. HEPATOBILIARY: Normal. PANCREAS: Normal. SPLEEN: Normal. ADRENALS/URINARY TRACT: Kidneys are orthotopic, demonstrating normal size and morphology. No nephrolithiasis, hydronephrosis; limited assessment for renal masses on this nonenhanced examination. 4.2 cm exophytic LEFT upper pole renal cyst. The unopacified ureters are normal in course and caliber. Urinary bladder is partially distended and unremarkable. Normal adrenal glands. STOMACH/BOWEL: The stomach,  small and large bowel are normal in course and caliber without inflammatory changes. Proximal duodenum diverticulum. Moderate colonic diverticulosis. Scattered small bowel diverticula noted in the RIGHT abdomen. Normal appendix. VASCULAR/LYMPHATIC: Aortoiliac vessels are normal in course and caliber, trace calcific atherosclerosis. No lymphadenopathy by CT size criteria. REPRODUCTIVE: Mild prostatomegaly. OTHER: No intraperitoneal free fluid or free air. MUSCULOSKELETAL: Non-acute. Large fat containing LEFT inguinal hernia. Status post RIGHT inguinal herniorrhaphy. Small fat containing umbilical hernia. Degenerative change of lumbar spine resulting in moderate L5-S1 neural foraminal narrowing. IMPRESSION: No acute intra-abdominal or pelvic process. Small large bowel diverticulosis. Electronically Signed   By: Elon Alas M.D.   On: 09/03/2016 02:29   Dg Chest 1 View  Result Date: 09/28/2016 CLINICAL DATA:  Post thoracentesis EXAM: CHEST 1 VIEW COMPARISON:  09/26/2016 FINDINGS: Decreasing right pleural effusion following thoracentesis. No pneumothorax. Continued small to moderate right pleural effusion with right lower lobe atelectasis. Somewhat better aeration of the right base since prior study. No confluent opacity on the left. IMPRESSION: No pneumothorax following right thoracentesis. Small to moderate right pleural effusion with right base atelectasis, improved since prior study. Electronically Signed   By: Rolm Baptise M.D.   On: 09/28/2016 11:54   Dg Chest 2 View  Result Date: 09/29/2016 CLINICAL DATA:  Pleuro few status post thoracentesis. EXAM: CHEST  2 VIEW COMPARISON:  09/28/2016 FINDINGS: Cardiomediastinal silhouette is normal. Mediastinal contours appear intact. There is a stable in size right pleural effusion with compressive atelectasis of the right lower lobe. No evidence of pneumothorax. Osseous structures are without acute abnormality. Soft tissues are grossly normal. IMPRESSION:  Stable in size right pleural effusion with compressive atelectasis of the right lower lobe. No evidence of pneumothorax. Electronically  Signed   By: Fidela Salisbury M.D.   On: 09/29/2016 09:55   Dg Chest 2 View  Result Date: 09/05/2016 CLINICAL DATA:  66 year old male with right upper quadrant pain, shortness of breath and generalized weakness for the past 4 days EXAM: CHEST  2 VIEW COMPARISON:  Prior chest x-ray 10/12/2013 FINDINGS: Interval removal of right IJ approach tunneled hemodialysis catheter. New patchy right lower lobe airspace opacity predominantly in the lower lobe. Additionally, there is atelectasis in the right middle lobe. Suspect a small layering pleural effusion as well. Stable borderline cardiomegaly. Probable trace chronic left pleural effusion. Mild vascular congestion without overt edema. No acute osseous abnormality. IMPRESSION: 1. New patchy airspace opacity in the right lower lobe concerning for pneumonia. Followup PA and lateral chest X-ray is recommended in 3-4 weeks following trial of antibiotic therapy to ensure resolution and exclude underlying malignancy. 2. Right middle lobe atelectasis. 3. Small right pleural effusion is likely parapneumonic. 4. Stable cardiomegaly and mild vascular congestion without overt edema. 5. Similar appearance of chronic small left pleural effusion. Electronically Signed   By: Jacqulynn Cadet M.D.   On: 09/05/2016 08:27   Dg Abdomen 1 View  Result Date: 09/02/2016 CLINICAL DATA:  Nausea and vomiting and generalized abdominal pain and cramping starting this evening. EXAM: ABDOMEN - 1 VIEW COMPARISON:  CT from 05/21/2011 FINDINGS: Moderate colonic stool burden within large bowel. No bowel obstruction or free air. No organomegaly. Phleboliths are seen in the pelvis. L4-5 and L5-S1 facet arthropathy. IMPRESSION: Moderate colonic stool burden.  No bowel obstruction. Electronically Signed   By: Ashley Royalty M.D.   On: 09/02/2016 23:47   Ct Chest Wo  Contrast  Result Date: 09/27/2016 CLINICAL DATA:  Pt admitted 09/05/16 for PNA; pt has since developed pleural effusions; pt states that he "just had the fluid removed, and that it re-accumulated within 48 hours"; pt denies any chest pain EXAM: CT CHEST WITHOUT CONTRAST TECHNIQUE: Multidetector CT imaging of the chest was performed following the standard protocol without IV contrast. COMPARISON:  Chest radiograph, 09/26/2016.  Chest CT, 09/07/2016. FINDINGS: Cardiovascular: Heart is normal in size. Great vessels are normal in caliber. No significant aortic atherosclerotic plaque. No significant coronary artery calcifications. Mediastinum/Nodes: No neck base or axillary masses or enlarged lymph nodes. Thyroid gland is unremarkable. No mediastinal masses or enlarged lymph nodes. No left hilar masses or adenopathy. Right hilum is partly obscured by contiguous lung opacity. Lungs/Pleura: Moderate right pleural effusion. There is atelectasis of the right lower lobe and partial atelectasis of the dependent right middle lobe. Some dependent secretions are noted in the bronchus intermedius and right lower lobe bronchi. There is mild subsegmental atelectasis in the dependent right upper lobe. Mild peribronchovascular linear and reticular opacities are noted in the left lower lobe there also likely atelectasis. No left pleural effusion. No pulmonary edema. No pneumothorax. Upper Abdomen: Trace amount of ascites adjacent to the liver. Otherwise unremarkable Musculoskeletal: No fracture or acute finding. No osteoblastic or osteolytic lesions. IMPRESSION: 1. Moderate right pleural effusion with complete atelectasis of the right lower lobe and partial atelectasis of the right middle lobe. Mild dependent subsegmental atelectasis noted in the dependent right upper lobe. No centrally obstructing mass is seen. Mucous/secretions are noted in the bronchus intermedius and right lower lobe segmental bronchi. 2. Mild subsegmental  atelectasis in the left lower lobe. 3. No convincing pneumonia.  No pulmonary edema. Electronically Signed   By: Lajean Manes M.D.   On: 09/27/2016 11:58   Ct  Chest Wo Contrast  Result Date: 09/07/2016 CLINICAL DATA:  66 year old male with hypoxia, fever and right-sided chest pain. Evaluate for pneumonia. EXAM: CT CHEST WITHOUT CONTRAST TECHNIQUE: Multidetector CT imaging of the chest was performed following the standard protocol without IV contrast. COMPARISON:  Chest x-ray obtained earlier today ; prior CT scan of the abdomen and pelvis 09/03/2016 FINDINGS: Cardiovascular: Limited evaluation in the absence of intravenous contrast. Conventional 3 vessel arch anatomy. No evidence of aneurysm. The heart is normal in size. No pericardial effusion. The main pulmonary artery is borderline enlarged at 3.2 cm. Mediastinum/Nodes: Unremarkable CT appearance of the thyroid gland. No suspicious mediastinal or hilar adenopathy. No soft tissue mediastinal mass. The thoracic esophagus is unremarkable. Lungs/Pleura: Multifocal atelectasis involving the right middle and bilateral lower lobes. The degree of opacification of the right lower lobe is slightly greater than expected for atelectasis and a small amount of superimposed consolidation is difficult to exclude entirely. There is respiratory motion artifact which slightly limits evaluation. Trace left-sided pleural effusion. Upper Abdomen: Incompletely imaged gallbladder distension. Musculoskeletal: No acute fracture or aggressive appearing lytic or blastic osseous lesion. IMPRESSION: 1. Pulmonary findings are most consistent with multifocal atelectasis involving the right middle, right lower and left lower lobes. The degree of opacity in the right lower lobe is slightly greater than expected for the amount of volume loss and therefore a superimposed infiltrate/pneumonia is difficult to exclude radiographically. 2. Trace left pleural effusion. 3. Borderline enlarged main  pulmonary artery. Query clinical history of pulmonary arterial hypertension? 4. Incompletely imaged gallbladder appears distended. Does the patient have right upper quadrant pain? If there is clinical concern for cholecystitis, consider right upper quadrant ultrasound. Electronically Signed   By: Jacqulynn Cadet M.D.   On: 09/07/2016 14:10   Ir Perc Cholecystostomy  Result Date: 09/08/2016 INDICATION: ACUTE CHOLECYSTITIS, NON OPERATIVE CANDIDATE EXAM: CHOLECYSTOSTOMY MEDICATIONS: 3.375 G ZOSYN; The antibiotic was administered within an appropriate time frame prior to the initiation of the procedure. ANESTHESIA/SEDATION: Moderate (conscious) sedation was employed during this procedure. A total of Versed 1.0 mg and Fentanyl 25 mcg was administered intravenously. Moderate Sedation Time: 10 minutes. The patient's level of consciousness and vital signs were monitored continuously by radiology nursing throughout the procedure under my direct supervision. FLUOROSCOPY TIME:  Fluoroscopy Time:  48 seconds (10 mGy). COMPLICATIONS: None immediate. PROCEDURE: Informed written consent was obtained from the patient after a thorough discussion of the procedural risks, benefits and alternatives. All questions were addressed. Maximal Sterile Barrier Technique was utilized including caps, mask, sterile gowns, sterile gloves, sterile drape, hand hygiene and skin antiseptic. A timeout was performed prior to the initiation of the procedure. Previous imaging reviewed. Preliminary ultrasound performed. The abnormal gallbladder was localized in the right upper quadrant through a lower intercostal space. Overlying skin marked. Under sterile conditions and local anesthesia, percutaneous transhepatic needle access performed of the gallbladder under direct ultrasound. Needle position confirmed with ultrasound. There was return of bile. Guidewire inserted followed by the Accustick dilator set. Amplatz guidewire exchange performed. Tract  dilatation performed to insert a 10 [place] drain. Drain catheter position confirmed with ultrasound and fluoroscopy. There was return of exudative bile. Sample sent for Gram stain and culture. Catheter secured with a Prolene suture and connected to external gravity drainage. Sterile dressing applied. No immediate complication. IMPRESSION: Successful ultrasound and fluoroscopic 10 French transhepatic cholecystostomy. Electronically Signed   By: Jerilynn Mages.  Shick M.D.   On: 09/08/2016 12:14   Dg Chest 2v Repeat Same Day  Result Date:  09/23/2016 CLINICAL DATA:  Acute onset of central chest pain, shortness of breath, wheezing, productive cough, congestion and nausea. Initial encounter. EXAM: CHEST  2 VIEW COMPARISON:  Chest radiograph performed 09/14/2016 FINDINGS: The lungs are well-aerated. A large right-sided pleural effusion is noted, with right-sided airspace opacification, concerning for asymmetric pulmonary edema. There is no evidence of pneumothorax. The heart is borderline normal in size. No acute osseous abnormalities are seen. IMPRESSION: Large right-sided pleural effusion, with right-sided airspace opacification, concerning for asymmetric pulmonary edema. Electronically Signed   By: Garald Balding M.D.   On: 09/23/2016 19:36   Dg Chest Port 1 View  Result Date: 09/26/2016 CLINICAL DATA:  Pleural effusion EXAM: PORTABLE CHEST 1 VIEW COMPARISON:  Two days ago FINDINGS: Right pleural effusion that is moderate to large, increased from prior. Partly visible drain at the lower chest on the right is a cholecystostomy tube based on January 2018 abdominal CT. Normal heart size. Comparatively clear left lung. No pneumothorax. IMPRESSION: Moderate to large right pleural effusion. Fluid volume is mildly increased since 2 days ago. Electronically Signed   By: Monte Fantasia M.D.   On: 09/26/2016 07:59   Dg Chest Port 1 View  Result Date: 09/24/2016 CLINICAL DATA:  Pleural effusion EXAM: PORTABLE CHEST 1 VIEW  COMPARISON:  09/23/2016 CXR, 09/07/2016 CT FINDINGS: Interval decrease in large right effusion/along the periphery of the right hemithorax. Right basilar atelectasis is identified. Chronic elevation of the right hemidiaphragm. Heart is top-normal in size. Aortic atherosclerosis is noted. There is mild pulmonary vascular congestion. There is osteoarthritis about both AC and glenohumeral joints. IMPRESSION: Interval decrease in right effusion. Right basilar atelectasis. Aortic atherosclerosis. There is mild vascular congestion consistent with mild CHF. Electronically Signed   By: Ashley Royalty M.D.   On: 09/24/2016 18:06   Dg Chest Port 1 View  Result Date: 09/14/2016 CLINICAL DATA:  Followup pleural effusion. EXAM: PORTABLE CHEST 1 VIEW COMPARISON:  09/13/2016.  Chest CT dated 09/07/2016. FINDINGS: The right jugular catheter is unchanged. Elevation of the right hemidiaphragm and right pleural fluid are unchanged. Mild left lower lobe atelectasis. Stable lower thoracic spine degenerative changes. IMPRESSION: 1. Stable elevated right hemidiaphragm and right pleural fluid. 2. Mild left lower lobe atelectasis. Electronically Signed   By: Claudie Revering M.D.   On: 09/14/2016 08:46   Dg Chest Port 1 View  Result Date: 09/13/2016 CLINICAL DATA:  Follow-up pleural effusion. EXAM: PORTABLE CHEST 1 VIEW COMPARISON:  09/12/2016 FINDINGS: Right internal jugular central line tip is in the SVC at the azygos level. The left lung remains clear except for mild left base atelectasis. Right lower and middle lobe collapse persists. Probable subpulmonic effusion on the right. IMPRESSION: Only mild residual left base atelectasis. Persistent collapse of the right lower and middle lobes, probably with right subpulmonic effusion. Electronically Signed   By: Nelson Chimes M.D.   On: 09/13/2016 07:29   Dg Chest Port 1 View  Result Date: 09/12/2016 CLINICAL DATA:  Respiratory failure. EXAM: PORTABLE CHEST 1 VIEW COMPARISON:  09/11/2016 .  FINDINGS: Right IJ line in stable position. Stable cardiomegaly. Improved pulmonary interstitial edema. Persistent right base atelectasis. Improved left mid lung field subsegmental atelectasis. Small right pleural effusion . No pneumothorax. IMPRESSION: 1. Right IJ line stable position. 2. Stable mild cardiomegaly. Improvement of pulmonary interstitial edema. Small right pleural effusion. 3. Persistent right base atelectasis. Improvement of left mid lung field atelectasis . Electronically Signed   By: Marcello Moores  Register   On: 09/12/2016 07:10  Dg Chest Port 1 View  Result Date: 09/11/2016 CLINICAL DATA:  Hemodialysis catheter insertion, assess tip position ; patient's electronic health record is not available at time of this interpretation EXAM: PORTABLE CHEST 1 VIEW COMPARISON:  Portable exam 1534 hours compared 09/11/2016 FINDINGS: Tip of RIGHT jugular central venous catheter projects over SVC at the level of the carina. Upper normal heart size. Slight pulmonary vascular congestion. Mediastinal contours normal. Improved pulmonary edema with persistent atelectasis versus consolidation at RIGHT lung base. Associated RIGHT pleural effusion. No pneumothorax following central line insertion. IMPRESSION: No pneumothorax following central line placement. Improved pulmonary edema with persistent atelectasis versus consolidation and pleural effusion at RIGHT lung base. Electronically Signed   By: Lavonia Dana M.D.   On: 09/11/2016 15:46   Dg Chest Port 1 View  Result Date: 09/11/2016 CLINICAL DATA:  Onset epigastric pain today.  Wheezing. EXAM: PORTABLE CHEST 1 VIEW COMPARISON:  09/09/2016 FINDINGS: Diffuse bilateral airspace disease, right greater than left, most confluent in the right lower lung. While this could reflect edema, cannot exclude pneumonia. Heart is borderline in size. Probable right effusion. IMPRESSION: Bilateral airspace disease, right greater than left, most confluent in the right base. Edema  versus infection. Electronically Signed   By: Rolm Baptise M.D.   On: 09/11/2016 11:13   Dg Chest Port 1 View  Result Date: 09/09/2016 CLINICAL DATA:  Shortness of breath, wheezing. EXAM: PORTABLE CHEST 1 VIEW COMPARISON:  Radiograph August 07, 2017. FINDINGS: Stable cardiomediastinal silhouette. No pneumothorax is noted. Stable left midlung atelectasis is noted. Stable right basilar opacity is noted concerning for atelectasis or infiltrate. Bony thorax is unremarkable. IMPRESSION: Stable left midlung and right basilar opacities are noted concerning for atelectasis or possibly infiltrate. Electronically Signed   By: Marijo Conception, M.D.   On: 09/09/2016 09:50   Dg Chest Port 1 View  Result Date: 09/07/2016 CLINICAL DATA:  Hypoxia EXAM: PORTABLE CHEST 1 VIEW COMPARISON:  Chest radiograph and chest CT September 07, 2016 FINDINGS: There is persistent patchy airspace consolidation in the left mid lung and right base regions. There is no evident new opacity compared to earlier in the day. Heart is mildly enlarged with pulmonary vascularity within normal limits. There is atherosclerotic calcification in the aorta. There is stable eventration of the right hemidiaphragm. No adenopathy. No bone lesions. IMPRESSION: Areas of airspace consolidation in the left mid lung and right base regions. No new opacity. Stable cardiac silhouette. There is aortic atherosclerosis. Electronically Signed   By: Lowella Grip III M.D.   On: 09/07/2016 21:45   Dg Chest Port 1 View  Result Date: 09/07/2016 CLINICAL DATA:  Fever, hypoxia.  Subsequent encounter. EXAM: PORTABLE CHEST 1 VIEW COMPARISON:  09/05/2016 FINDINGS: There is opacity at the right lung base which is similar to the prior exam allowing for differences in patient positioning and technique. Mild linear scarring or atelectasis in the left mid lung, stable. Remainder of the lungs is clear. Elevation right hemidiaphragm is stable. No obvious pleural effusion. No  pneumothorax. IMPRESSION: 1. Right base opacity is similar to the prior exam. Although this may be atelectasis only, pneumonia should be considered likely given the symptoms of fever and hypoxia. 2. No significant change from the prior exam allowing for differences in positioning and technique. Electronically Signed   By: Lajean Manes M.D.   On: 09/07/2016 09:18   Dg Abd 2 Views  Result Date: 09/05/2016 CLINICAL DATA:  Abdominal distention EXAM: ABDOMEN - 2 VIEW COMPARISON:  CT abdomen  and pelvis September 03, 2016 FINDINGS: Supine and upright images obtained. There is contrast in the colon. There is no bowel dilatation or air-fluid level suggesting bowel obstruction. No free air. There are phleboliths in the pelvis. IMPRESSION: Contrast in colon.  No bowel obstruction or free air evident. Electronically Signed   By: Lowella Grip III M.D.   On: 09/05/2016 11:47   Dg Abd Portable 1v  Result Date: 09/10/2016 CLINICAL DATA:  Abdominal pain.  Cholecystostomy. EXAM: PORTABLE ABDOMEN - 1 VIEW COMPARISON:  CT 09/07/2016 FINDINGS: Pigtail catheter right upper quadrant in satisfactory position most likely within the gallbladder. Normal bowel gas pattern. No obstruction or ileus. Small amount of contrast or ingested tablets in the stomach. IMPRESSION: Cholecystostomy tube in good position.  Normal bowel gas pattern. Electronically Signed   By: Franchot Gallo M.D.   On: 09/10/2016 15:38   US Thoracentesis Asp Pleural Space W/img Guide  Result Date: 09/28/2016 INDICATION: Patient with recent cholecystitis and right-sided pneumonia. Prior thoracentesis earlier this week. Recurrent right pleural effusion. Request is made for diagnostic and therapeutic thoracentesis. EXAM: ULTRASOUND GUIDED DIAGNOSTIC AND THERAPEUTIC THORACENTESIS MEDICATIONS: 1% lidocaine COMPLICATIONS: None immediate. PROCEDURE: An ultrasound guided thoracentesis was thoroughly discussed with the patient and questions answered. The benefits, risks,  alternatives and complications were also discussed. The patient understands and wishes to proceed with the procedure. Written consent was obtained. Ultrasound was performed to localize and mark an adequate pocket of fluid in the right chest. The area was then prepped and draped in the normal sterile fashion. 1% Lidocaine was used for local anesthesia. Under ultrasound guidance a Safe-T-Centesis catheter was introduced. Thoracentesis was performed. The catheter was removed and a dressing applied. FINDINGS: A total of approximately 0.68 L of bloody fluid was removed. Samples were sent to the laboratory as requested by the clinical team. IMPRESSION: Successful ultrasound guided right thoracentesis yielding 0.68 L of pleural fluid. Read by: Saverio Danker, PA-C Electronically Signed   By: Markus Daft M.D.   On: 09/28/2016 11:44    Micro Results     Recent Results (from the past 240 hour(s))  Body fluid culture     Status: None   Collection Time: 09/24/16  5:10 PM  Result Value Ref Range Status   Specimen Description FLUID RIGHT PLEURAL  Final   Special Requests NONE  Final   Gram Stain   Final    ABUNDANT WBC PRESENT,BOTH PMN AND MONONUCLEAR NO ORGANISMS SEEN    Culture NO GROWTH 3 DAYS  Final   Report Status 09/28/2016 FINAL  Final  Culture, body fluid-bottle     Status: None (Preliminary result)   Collection Time: 09/28/16 11:30 AM  Result Value Ref Range Status   Specimen Description PLEURAL  Final   Special Requests NONE  Final   Culture NO GROWTH < 24 HOURS  Final   Report Status PENDING  Incomplete  Gram stain     Status: None   Collection Time: 09/28/16 11:30 AM  Result Value Ref Range Status   Specimen Description PLEURAL  Final   Special Requests NONE  Final   Gram Stain   Final    ABUNDANT WBC PRESENT, PREDOMINANTLY MONONUCLEAR NO ORGANISMS SEEN    Report Status 09/28/2016 FINAL  Final       Today   Subjective:   Gary Lang today has no headache,no chest or  abdominal pain, feels much better wants to go home today.   Objective:   Blood pressure 117/63, pulse 87, temperature  99.1 F (37.3 C), temperature source Oral, resp. rate 19, height 6' 2.5" (1.892 m), weight 99.3 kg (218 lb 14.4 oz), SpO2 99 %.   Intake/Output Summary (Last 24 hours) at 09/29/16 1516 Last data filed at 09/29/16 1333  Gross per 24 hour  Intake              840 ml  Output             2100 ml  Net            -1260 ml    Exam Awake Alert, Oriented X 3,  Supple Neck,No JVD,  Symmetrical Chest wall movement,Improved air entry today, but mildly diminished on right side . RRR, no Gallops,Rubs or new Murmurs, No Parasternal Heave +ve B.Sounds, Abd Soft, No tenderness, , No rebound - guarding or rigidity. No Cyanosis, Clubbing or edema, No new Rash or bruise    Data Review   CBC w Diff:  Lab Results  Component Value Date   WBC 6.7 09/29/2016   HGB 7.9 (L) 09/29/2016   HCT 23.9 (L) 09/29/2016   PLT 255 09/29/2016   LYMPHOPCT 5 09/05/2016   MONOPCT 4 09/05/2016   EOSPCT 0 09/05/2016   BASOPCT 0 09/05/2016    CMP:  Lab Results  Component Value Date   NA 136 09/29/2016   K 4.2 09/29/2016   CL 98 (L) 09/29/2016   CO2 28 09/29/2016   BUN 41 (H) 09/29/2016   CREATININE 3.58 (H) 09/29/2016   PROT 6.6 09/28/2016   ALBUMIN 1.7 (L) 09/29/2016   BILITOT 1.6 (H) 09/12/2016   ALKPHOS 95 09/12/2016   AST 21 09/12/2016   ALT 30 09/12/2016  .   Total Time in preparing paper work, data evaluation and todays exam - 35 minutes  ELGERGAWY, DAWOOD M.D on 09/29/2016 at 3:16 PM  [facility]   Office  [phone]

## 2016-09-29 NOTE — Progress Notes (Addendum)
Name: Gary Lang MRN: [id number] DOB: 1951/05/29    ADMISSION DATE:  09/05/2016 CONSULTATION DATE:  09/24/16  REFERRING MD :  Dr. Wendee Beavers   CHIEF COMPLAINT:  SOB, R Pleural Effusion    BRIEF SUMMARY:  66 yobm remote smoker admitted 1/4 with RUQ abdominal pain, decreased appetite, fever and SOB.  Evaluation found him to have sepsis secondary to E-Coli bacteremia in the setting of acute cholecystitis.  On admit, there were also concerns for possible RLL PNA/CAP.  The patient was evaluated by CCS but felt not stable enough for surgery.  A percutaneous cholecystostomy drain was placed on 09/08/16 by IR.  Hospital course complicated by new onset paroxysmal atrial fibrillation.  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  During hospitalization, he had worsening renal failure in the setting of sepsis and required CVVHD.  Blood cultures grew E-Coli and gallbladder fluid grew E-Coli and K. Pneumoniae.  He was treated with IV antibiotics (rocephin / azithro / zosyn).  Initial CT of the abdomen showed fluid collection near the gallbladder.  The patient later developed increased SOB, cough and wheezing.  CXR noted increased pleural effusion.  PCCM called back 1/23 for evaluation.    SUBJECTIVE:     Feeling much better p R thoracentesis 1/27  VITAL SIGNS: Temp:  [98.1 F (36.7 C)-99.6 F (37.6 C)] 99.1 F (37.3 C) (01/28 0641) Pulse Rate:  [75-87] 87 (01/28 0641) Resp:  [19-20] 19 (01/28 0641) BP: (117-128)/(57-65) 117/63 (01/28 0641) SpO2:  [95 %-99 %] 99 % (01/28 0641) Weight:  [218 lb 14.4 oz (99.3 kg)] 218 lb 14.4 oz (99.3 kg) (01/28 0641) FIO2  2lpm NP   PHYSICAL EXAMINATION: General: adult male in NAD lying in bed HEENT: MM pink/moist Neuro: AAOx4, speech clear, MAE CV: s1s2 rrr, no m/r/g PULM: non-labored, clear bilaterally to A and P  CH:ENID, non-tender, bsx4 active  Extremities: warm/dry, no edema  Skin: no rashes or  lesions    Recent Labs Lab 09/26/16 0533 09/27/16 0556 09/28/16 0501  NA 140 139 139  K 3.6 3.7 3.7  CL 100* 100* 100*  CO2 '30 30 30  '$ BUN 60* 54* 47*  CREATININE 4.25* 4.04* 3.72*  GLUCOSE 91 149* 111*     Recent Labs Lab 09/26/16 0533 09/27/16 0556 09/28/16 0501  HGB 8.3* 8.4* 7.4*  HCT 26.6* 25.9* 23.5*  WBC 7.5 6.0 6.2  PLT 194 189 211    Dg Chest 1 View  Result Date: 09/28/2016 CLINICAL DATA:  Post thoracentesis EXAM: CHEST 1 VIEW COMPARISON:  09/26/2016 FINDINGS: Decreasing right pleural effusion following thoracentesis. No pneumothorax. Continued small to moderate right pleural effusion with right lower lobe atelectasis. Somewhat better aeration of the right base since prior study. No confluent opacity on the left. IMPRESSION: No pneumothorax following right thoracentesis. Small to moderate right pleural effusion with right base atelectasis, improved since prior study. Electronically Signed   By: Rolm Baptise M.D.   On: 09/28/2016 11:54   Ct Chest Wo Contrast  Result Date: 09/27/2016 CLINICAL DATA:  Pt admitted 09/05/16 for PNA; pt has since developed pleural effusions; pt states that he "just had the fluid removed, and that it re-accumulated within 48 hours"; pt denies any chest pain EXAM: CT CHEST WITHOUT CONTRAST TECHNIQUE: Multidetector CT imaging of the chest was performed following the standard protocol without IV contrast. COMPARISON:  Chest radiograph, 09/26/2016.  Chest CT, 09/07/2016. FINDINGS: Cardiovascular: Heart is normal in size. [employer]  vessels are normal in caliber. No significant aortic atherosclerotic plaque. No significant coronary artery calcifications. Mediastinum/Nodes: No neck base or axillary masses or enlarged lymph nodes. Thyroid gland is unremarkable. No mediastinal masses or enlarged lymph nodes. No left hilar masses or adenopathy. Right hilum is partly obscured by contiguous lung opacity. Lungs/Pleura: Moderate right pleural effusion. There is  atelectasis of the right lower lobe and partial atelectasis of the dependent right middle lobe. Some dependent secretions are noted in the bronchus intermedius and right lower lobe bronchi. There is mild subsegmental atelectasis in the dependent right upper lobe. Mild peribronchovascular linear and reticular opacities are noted in the left lower lobe there also likely atelectasis. No left pleural effusion. No pulmonary edema. No pneumothorax. Upper Abdomen: Trace amount of ascites adjacent to the liver. Otherwise unremarkable Musculoskeletal: No fracture or acute finding. No osteoblastic or osteolytic lesions. IMPRESSION: 1. Moderate right pleural effusion with complete atelectasis of the right lower lobe and partial atelectasis of the right middle lobe. Mild dependent subsegmental atelectasis noted in the dependent right upper lobe. No centrally obstructing mass is seen. Mucous/secretions are noted in the bronchus intermedius and right lower lobe segmental bronchi. 2. Mild subsegmental atelectasis in the left lower lobe. 3. No convincing pneumonia.  No pulmonary edema. Electronically Signed   By: Lajean Manes M.D.   On: 09/27/2016 11:58   US Thoracentesis Asp Pleural Space W/img Guide  Result Date: 09/28/2016 INDICATION: Patient with recent cholecystitis and right-sided pneumonia. Prior thoracentesis earlier this week. Recurrent right pleural effusion. Request is made for diagnostic and therapeutic thoracentesis. EXAM: ULTRASOUND GUIDED DIAGNOSTIC AND THERAPEUTIC THORACENTESIS MEDICATIONS: 1% lidocaine COMPLICATIONS: None immediate. PROCEDURE: An ultrasound guided thoracentesis was thoroughly discussed with the patient and questions answered. The benefits, risks, alternatives and complications were also discussed. The patient understands and wishes to proceed with the procedure. Written consent was obtained. Ultrasound was performed to localize and mark an adequate pocket of fluid in the right chest. The area  was then prepped and draped in the normal sterile fashion. 1% Lidocaine was used for local anesthesia. Under ultrasound guidance a Safe-T-Centesis catheter was introduced. Thoracentesis was performed. The catheter was removed and a dressing applied. FINDINGS: A total of approximately 0.68 L of bloody fluid was removed. Samples were sent to the laboratory as requested by the clinical team. IMPRESSION: Successful ultrasound guided right thoracentesis yielding 0.68 L of pleural fluid. Read by: Saverio Danker, PA-C Electronically Signed   By: Markus Daft M.D.   On: 09/28/2016 11:44     CXR PA and Lateral:   09/05/2016 :    I personally reviewed images and agree with radiology impression as follows:    Stable in size right pleural effusion with compressive atelectasis of the right lower lobe. No evidence of pneumothorax.     SIGNIFICANT EVENTS  1/04  Admit with RUQ abd pain 1/07  Percutaneous cholecystostomy tube placement  1/11  To ICU, R IJ HD cath placed for CVVHD 1/14  To [facility]  1/23  PCCM called back for pleural effusion 1/23  R Thora with 800 ml bloody fluid drained  LDH 524 WBC 1448 41N Prot 4.2 1/27  Repeat R thoracentesis by IR x 680 cc  LDH 622  WBC 1453 46N Prot 4.1 Glucose 105    STUDIES:  1/06  CT Chest w/o >> multifocal atelectasis of RML, RLL, LLL, trace L effusion 1/06  CT Abd/Pelvis >> extensive pericholecystic fluid, small non-calcified gallstone or sludge near the neck of the gallbladder,  inflammatory changes around the gallbladder > concern for acute cholecystitis 1/10  CT Abd/Pelvis >> interval placement of cholecystostomy catheter with gallbladder decompression, subcapsular fluid collection decreased, increased moderate R pleural effusion with R lower consolidation   ASSESSMENT / PLAN:  Discussion:  66 y/o M admitted with sepsis/bacteremia secondary to acute cholecystitis s/p percutaneous cholecystostomy drain placement.  Course complicated by PAF, elevated troponin, acute  on chronic CKD with known hx of FSGS.  PCCM called back 1/23 for assessment of wheezing / increased right pleural effusion.    1.  Exudative Right Pleural Effusion - likely multifactorial in the setting of recent e-coli bacteremia, possible HCAP and sympathetic effusion from acute cholecystitis.  Rule out empyema / complicated pleural space, hemothorax on coumadin.  - no real change on repeat tap 1/27 and if reaccumulates will need pleurex next vs VATS depending on severity and whether he could tol additional surgery   Recs: Check cultures on thoracentesis from 1/27 No change rx o/w > could go home and f/u as outpt    2.  Wheezing - prior hx of smoking - resolved   Plan: - xopenex + atrovent  > try change to prn 12/28   3. Acute hypoxemic resp failure  secondary to #1 > wean off 02 as tol  4.  Residual atx at R Base > rec IS/ f/u in office in one week/ Dr Guadalupe Dawn or his NP     Christinia Gully, MD Pulmonary and [place] [phone] After 5:30 PM or weekends, use [facility] [phone]

## 2016-09-29 NOTE — Progress Notes (Signed)
[facility] Rounding Note Subjective:  Not sure of plan CXR post 2nd thoracentesis still large effusion, RLL atelectasis Dr. Melvyn Novas mentions VATS, also mentions home with outpt F/U From a renal standpoint continued slow improvement in renal function  Objective: I/O last 3 completed shifts: In: 1085 [P.O.:840; Other:245] Out: 2475 [TJQZE:0923; Drains:450; Other:75] Total I/O In: -  Out: 350 [Urine:350]  Physical Examination Very nice AAM VS as noted NAD No JVD L side clear.  Breath sounds still very reduced at R base Abd soft, perc chole tube in place, brown fluid (350 drained yest)  No LE edema L FA AVF + bruit and thrill   Recent Labs  09/27/16 0556 09/28/16 0501  WBC 6.0 6.2  HGB 8.4* 7.4*  HCT 25.9* 23.5*  PLT 189 211    Recent Labs  09/27/16 0556 09/28/16 0501  NA 139 139  K 3.7 3.7  CL 100* 100*  CO2 30 30  GLUCOSE 149* 111*  BUN 54* 47*  CREATININE 4.04* 3.72*  CALCIUM 8.1* 8.1*   Iron/TIBC/Ferritin/ %Sat    Component Value Date/Time   IRON 21 (L) 09/27/2016 0556   TIBC 235 (L) 09/27/2016 0556   IRONPCTSAT 9 (L) 09/27/2016 0556   Dg Chest 2 View  Result Date: 09/29/2016 CLINICAL DATA:  Pleuro few status post thoracentesis. EXAM: CHEST  2 VIEW COMPARISON:  09/28/2016 FINDINGS: Cardiomediastinal silhouette is normal. Mediastinal contours appear intact. There is a stable in size right pleural effusion with compressive atelectasis of the right lower lobe. No evidence of pneumothorax. Osseous structures are without acute abnormality. Soft tissues are grossly normal. IMPRESSION: Stable in size right pleural effusion with compressive atelectasis of the right lower lobe. No evidence of pneumothorax. Electronically Signed   By: Fidela Salisbury M.D.   On: 09/29/2016 09:55    Scheduled medications . benzonatate  100 mg Oral TID  . calcium acetate  667 mg Oral TID WC  . cholecalciferol  1,000 Units Oral Daily  . cloNIDine  0.3 mg Oral TID  . darbepoetin  (ARANESP) injection - NON-DIALYSIS  100 mcg Subcutaneous Q Thu-1800  . diltiazem  240 mg Oral Daily  . feeding supplement (NEPRO CARB STEADY)  237 mL Oral BID BM  . furosemide  40 mg Oral BID  . insulin aspart  0-9 Units Subcutaneous TID AC & HS  . insulin glargine  35 Units Subcutaneous QAC breakfast  . pantoprazole  40 mg Oral Daily  . saccharomyces boulardii  250 mg Oral BID  . senna-docusate  1 tablet Oral QHS  . Warfarin - Pharmacist Dosing Inpatient   Does not apply q1800     Background:  66 yo AAM with CKD 2/2 FSGS, baseline CKD 3, DM, HTNadmitted 1/4 with EColi bacteremia 2/2 acute cholecystitis, s/p perc cholecystostomy, RLL PNA, resp failure, new onset AFib. Developed AKI on CKD, required CRRT 1/10 for 48 hours transiently primarily for ultrafiltration, did not require HD, renal function spontaneously improving. Required thoracentesis 1/23 for R pleural effusion. Baseline creatinine prior to current admission 2.67.   Assessment/ Plan:    1. 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 flutter with RVR. No HD required. Creatinine plateaued, now slow downward - last 3.7, no lab today. Vol status stable .Has left RC AV fistula (mature) if needed. Continue Lasix 40 PO BID for now (previous home dose was 40 QD). Will make sure has f/u with Dr. Florene Glen - timing will depend on discharge plans.  2. RLL PNA/acute cholecystitis:  S/p broad-spectrum antibiotics and s/p percutaneous cholecystostomy-- will need cholecystectomy as an outpatient.   3. Exudative R pleural effusion - s/p thoracentesis of 800 cc bloody fluid by pulm 1/23,  680 ml by IR today 1/27. Culture no growth so far. CT mod effusion, complete atelectasis RLL/partial RML. May need VATS. Dr. Gustavus Bryant note mentions outpt f/u with Dr. Chase Caller in a week. 4. S/p EColi bacteremia - finished ATB's for this 5. Anemia - slow decline Hb in the hospital. TSat 9%. Dosed Feraheme X1 1/26 (since  no ATB's). Continue Aranesp 100/week Started 09/26/16 and if still here will be redosed 2/1. 6. Hypertension: good. 7. Hyperphosphatemia: phoslo 1 tab TID with meals. Controlled 8. Atrial flutter with rapid ventricular response. Dilt.  9. Metabolic acidosis: resolved. Stopped po bicarb  I will go ahead and arrange for a hospital follow up with Dr. Florene Glen in the next couple of weeks, and can readjust appt timing dep on discharge date/plans for further R effusion w/u. Adding little to his care at this time.  Will sign off but please call if there are questions.  Jamal Maes, MD [place] Pager 09/29/2016, 1:14 PM

## 2016-09-29 NOTE — Progress Notes (Addendum)
CM notes previous CM set up [facility] with [place]. CM spoke with both pt and wife, Onalee Hua and family agrees NO HH services are needed as Onalee Hua is an Therapist, sports.  Family denies need for any DME as they have both a rolling walker and 3n1 at home.  No other CM needs were communicated.

## 2016-09-30 ENCOUNTER — Other Ambulatory Visit: Payer: Self-pay | Admitting: Surgery

## 2016-09-30 DIAGNOSIS — K8000 Calculus of gallbladder with acute cholecystitis without obstruction: Secondary | ICD-10-CM

## 2016-10-01 DIAGNOSIS — A419 Sepsis, unspecified organism: Secondary | ICD-10-CM | POA: Diagnosis not present

## 2016-10-01 DIAGNOSIS — J189 Pneumonia, unspecified organism: Secondary | ICD-10-CM | POA: Diagnosis not present

## 2016-10-01 DIAGNOSIS — Z4803 Encounter for change or removal of drains: Secondary | ICD-10-CM | POA: Diagnosis not present

## 2016-10-01 DIAGNOSIS — E559 Vitamin D deficiency, unspecified: Secondary | ICD-10-CM | POA: Diagnosis not present

## 2016-10-01 DIAGNOSIS — Z87891 Personal history of nicotine dependence: Secondary | ICD-10-CM | POA: Diagnosis not present

## 2016-10-01 DIAGNOSIS — Z7901 Long term (current) use of anticoagulants: Secondary | ICD-10-CM | POA: Diagnosis not present

## 2016-10-01 DIAGNOSIS — B9620 Unspecified Escherichia coli [E. coli] as the cause of diseases classified elsewhere: Secondary | ICD-10-CM | POA: Diagnosis not present

## 2016-10-01 DIAGNOSIS — I480 Paroxysmal atrial fibrillation: Secondary | ICD-10-CM | POA: Diagnosis not present

## 2016-10-01 DIAGNOSIS — I129 Hypertensive chronic kidney disease with stage 1 through stage 4 chronic kidney disease, or unspecified chronic kidney disease: Secondary | ICD-10-CM | POA: Diagnosis not present

## 2016-10-01 DIAGNOSIS — J90 Pleural effusion, not elsewhere classified: Secondary | ICD-10-CM | POA: Diagnosis not present

## 2016-10-01 DIAGNOSIS — E1122 Type 2 diabetes mellitus with diabetic chronic kidney disease: Secondary | ICD-10-CM | POA: Diagnosis not present

## 2016-10-01 DIAGNOSIS — M069 Rheumatoid arthritis, unspecified: Secondary | ICD-10-CM | POA: Diagnosis not present

## 2016-10-01 DIAGNOSIS — N183 Chronic kidney disease, stage 3 (moderate): Secondary | ICD-10-CM | POA: Diagnosis not present

## 2016-10-01 DIAGNOSIS — M059 Rheumatoid arthritis with rheumatoid factor, unspecified: Secondary | ICD-10-CM | POA: Diagnosis not present

## 2016-10-01 DIAGNOSIS — I739 Peripheral vascular disease, unspecified: Secondary | ICD-10-CM | POA: Diagnosis not present

## 2016-10-01 DIAGNOSIS — L89322 Pressure ulcer of left buttock, stage 2: Secondary | ICD-10-CM | POA: Diagnosis not present

## 2016-10-01 DIAGNOSIS — Z794 Long term (current) use of insulin: Secondary | ICD-10-CM | POA: Diagnosis not present

## 2016-10-01 NOTE — Progress Notes (Signed)
Patients daughter Onalee Hua called stating patient didn't receive any of his prescriptions on Sunday when he was discharged.  This RN located his prescriptions in the med folder at charge nurse desk. Patients daughter stated that someone would come to the hospital and pick up his scripts. Scripts left at front desk.  Sheliah Plane RN

## 2016-10-01 NOTE — Progress Notes (Signed)
Received message that patient didn't get the prescription on discharge. I called patient's wife and spoke with Ms. Dripps.  As per Ms. Ging, she called the nursing station at [facility] and spoke with the nurses. The nurse found the prescription. The pt's son is coming to hospital to pick up prescription. As per her, he is should be already at the hospital.

## 2016-10-02 DIAGNOSIS — E559 Vitamin D deficiency, unspecified: Secondary | ICD-10-CM | POA: Diagnosis not present

## 2016-10-02 DIAGNOSIS — B9620 Unspecified Escherichia coli [E. coli] as the cause of diseases classified elsewhere: Secondary | ICD-10-CM | POA: Diagnosis not present

## 2016-10-02 DIAGNOSIS — N183 Chronic kidney disease, stage 3 (moderate): Secondary | ICD-10-CM | POA: Diagnosis not present

## 2016-10-02 DIAGNOSIS — A419 Sepsis, unspecified organism: Secondary | ICD-10-CM | POA: Diagnosis not present

## 2016-10-02 DIAGNOSIS — M069 Rheumatoid arthritis, unspecified: Secondary | ICD-10-CM | POA: Diagnosis not present

## 2016-10-02 DIAGNOSIS — E1122 Type 2 diabetes mellitus with diabetic chronic kidney disease: Secondary | ICD-10-CM | POA: Diagnosis not present

## 2016-10-02 DIAGNOSIS — J189 Pneumonia, unspecified organism: Secondary | ICD-10-CM | POA: Diagnosis not present

## 2016-10-02 LAB — CHOLESTEROL, BODY FLUID: Cholesterol, Fluid: 100 mg/dL

## 2016-10-03 ENCOUNTER — Encounter: Payer: Self-pay | Admitting: Physician Assistant

## 2016-10-03 ENCOUNTER — Ambulatory Visit (INDEPENDENT_AMBULATORY_CARE_PROVIDER_SITE_OTHER): Payer: PPO | Admitting: Physician Assistant

## 2016-10-03 ENCOUNTER — Ambulatory Visit: Payer: PPO | Admitting: Physician Assistant

## 2016-10-03 VITALS — BP 131/70 | HR 96 | Ht 74.5 in | Wt 216.6 lb

## 2016-10-03 DIAGNOSIS — E1122 Type 2 diabetes mellitus with diabetic chronic kidney disease: Secondary | ICD-10-CM | POA: Diagnosis not present

## 2016-10-03 DIAGNOSIS — J90 Pleural effusion, not elsewhere classified: Secondary | ICD-10-CM | POA: Diagnosis not present

## 2016-10-03 DIAGNOSIS — R0609 Other forms of dyspnea: Secondary | ICD-10-CM | POA: Diagnosis not present

## 2016-10-03 DIAGNOSIS — B9620 Unspecified Escherichia coli [E. coli] as the cause of diseases classified elsewhere: Secondary | ICD-10-CM | POA: Diagnosis not present

## 2016-10-03 DIAGNOSIS — N183 Chronic kidney disease, stage 3 (moderate): Secondary | ICD-10-CM | POA: Diagnosis not present

## 2016-10-03 DIAGNOSIS — A419 Sepsis, unspecified organism: Secondary | ICD-10-CM | POA: Diagnosis not present

## 2016-10-03 DIAGNOSIS — Z87891 Personal history of nicotine dependence: Secondary | ICD-10-CM | POA: Diagnosis not present

## 2016-10-03 DIAGNOSIS — E559 Vitamin D deficiency, unspecified: Secondary | ICD-10-CM | POA: Diagnosis not present

## 2016-10-03 DIAGNOSIS — Z7901 Long term (current) use of anticoagulants: Secondary | ICD-10-CM

## 2016-10-03 DIAGNOSIS — I480 Paroxysmal atrial fibrillation: Secondary | ICD-10-CM | POA: Diagnosis not present

## 2016-10-03 DIAGNOSIS — Z794 Long term (current) use of insulin: Secondary | ICD-10-CM | POA: Diagnosis not present

## 2016-10-03 DIAGNOSIS — I129 Hypertensive chronic kidney disease with stage 1 through stage 4 chronic kidney disease, or unspecified chronic kidney disease: Secondary | ICD-10-CM | POA: Diagnosis not present

## 2016-10-03 DIAGNOSIS — M069 Rheumatoid arthritis, unspecified: Secondary | ICD-10-CM | POA: Diagnosis not present

## 2016-10-03 DIAGNOSIS — I739 Peripheral vascular disease, unspecified: Secondary | ICD-10-CM | POA: Diagnosis not present

## 2016-10-03 DIAGNOSIS — J189 Pneumonia, unspecified organism: Secondary | ICD-10-CM

## 2016-10-03 DIAGNOSIS — J918 Pleural effusion in other conditions classified elsewhere: Secondary | ICD-10-CM

## 2016-10-03 DIAGNOSIS — Z4803 Encounter for change or removal of drains: Secondary | ICD-10-CM | POA: Diagnosis not present

## 2016-10-03 DIAGNOSIS — L89322 Pressure ulcer of left buttock, stage 2: Secondary | ICD-10-CM | POA: Diagnosis not present

## 2016-10-03 LAB — CULTURE, BODY FLUID W GRAM STAIN -BOTTLE: Culture: NO GROWTH

## 2016-10-03 NOTE — Progress Notes (Signed)
Cardiology Office Note   Date:  10/03/2016   ID:  Gary CURRIE Sr., DOB [birth year], MRN [id number]  PCP:  [name], MD  Cardiologist:  Dr. Mardene Celeste, PA-C   Chief Complaint  Patient presents with  . Hospitalization Follow-up    History of Present Illness: Gary L Rowe Sr. is a 66 y.o. male with a history of CKD, DM, HTN, RA  Admit 01/04-01/28 w/ elevated troponin, AKI, sepsis, cholecystitis, CAP, atrial fib. Patient had cholecystostomy tube. He was unaware of the atrial fibrillation, CHA2DS2 VASc=3 for HTN, DM and age 3-74. He spontaneously converted to sinus rhythm, patient Started on warfarin, INR therapeutic at discharge. He was supposed to be on Cardizem CD 240 mg (up from 180 mg) at discharge but no rx given.  Gary L Lefkowitz Sr. presents for hospital follow up  Since discharge, he feels he is improving, but it is slow. He is walking a little more.   He is not having palpitations. He has not been lightheaded. He has had no awareness that his heart rate was overly fast, or irregular.  He has dyspnea on exertion but it is improving. There is no lower extremity edema, no orthopnea, no PND.  He is eating okay. He is being compliant with his medications. His wife is with him today and notes that she thought he was supposed to be on Cardizem CD 240 mg daily, but was not given a prescription for it. He has Cardizem CD 180 mg capsules at home.   He has an appointment with Dr. Nevada Crane tomorrow where he will get the chest x-ray, labs, urinalysis and INR.  He is not having any bleeding issues.   Past Medical History:  Diagnosis Date  . Chronic kidney disease   . Diabetes mellitus without complication ([place])   . Hypertension   . Rheumatoid arthritis ([place])   . Vitamin D deficiency    Hx: of    Past Surgical History:  Procedure Laterality Date  . AV FISTULA PLACEMENT Left 06/28/2013   Procedure: ARTERIOVENOUS (AV) FISTULA CREATION- Left arm with  ultrasound guidance;  Surgeon: Rosetta Posner, MD;  [facility];  Service: Vascular;  Laterality: Left;  . COLONOSCOPY     hx: of  . HERNIA REPAIR    . INSERTION OF DIALYSIS CATHETER N/A 06/28/2013   Procedure: INSERTION OF DIALYSIS CATHETER;  Surgeon: Rosetta Posner, MD;  Location: [place];  Service: Vascular;  Laterality: N/A;  . IR GENERIC HISTORICAL  09/08/2016   IR PERC CHOLECYSTOSTOMY 09/08/2016 [name], MD [facility]-INTERV RAD  . LIGATION OF COMPETING BRANCHES OF ARTERIOVENOUS FISTULA Left 10/12/2013   Procedure: LIGATION OF COMPETING BRANCHES OF ARTERIOVENOUS FISTULA;  Surgeon: Elam Dutch, MD;  Location: [facility] OR;  Service: Vascular;  Laterality: Left;  . neck surgery      Medication Sig  . acetaminophen (TYLENOL) 500 MG tablet Take 2 tablets (1,000 mg total) by mouth 3 (three) times daily as needed for pain.  . calcium acetate (PHOSLO) 667 MG capsule Take 1 capsule (667 mg total) by mouth 3 (three) times daily with meals.  . cholecalciferol (VITAMIN D) 1000 units tablet Take 1,000 Units by mouth daily.  . cloNIDine (CATAPRES) 0.3 MG tablet Take 1 tablet (0.3 mg total) by mouth 3 (three) times daily.  Marland Kitchen dicyclomine (BENTYL) 20 MG tablet Take 1 tablet (20 mg total) by mouth 2 (two) times daily as needed for spasms.  . ferrous sulfate (FERROUSUL) 325 (65  FE) MG tablet Take 1 tablet (325 mg total) by mouth daily with breakfast.  . furosemide (LASIX) 40 MG tablet Take 1 tablet (40 mg total) by mouth 2 (two) times daily.  . insulin aspart (NOVOLOG FLEXPEN) 100 UNIT/ML FlexPen Inject 7.5 Units into the skin 3 (three) times daily with meals.  . Insulin Glargine (LANTUS) 100 UNIT/ML Solostar Pen Inject 35 Units into the skin every morning. Increase 5 u if am CBG is greater than 200.  Decrease 5 u if am CBG is less than 150  . Insulin Pen Needle (PEN NEEDLES) 31G X 6 MM MISC Use 1 needle per injection as directed  . Nutritional Supplements (FEEDING SUPPLEMENT, NEPRO CARB STEADY,) LIQD Take 237 mLs  by mouth 2 (two) times daily between meals.  . Omega-3 Fatty Acids (FISH OIL PO) Take 1 capsule by mouth daily.  . ondansetron (ZOFRAN ODT) 4 MG disintegrating tablet Take 1 tablet (4 mg total) by mouth every 8 (eight) hours as needed for nausea or vomiting.  . senna-docusate (SENOKOT-S) 8.6-50 MG tablet Take 1 tablet by mouth at bedtime.  . TRULICITY 1.5 RK/2.7CW SOPN Inject 1 Units as directed once a week. Patient isn't sure how many units he takes. Patient takes on Wednesday's.  Marland Kitchen UNIFINE PENTIPS 31G X 5 MM MISC USE AS DIRECTED WITH INSULIN  . warfarin (COUMADIN) 2.5 MG tablet Take 1 tablet (2.5 mg total) by mouth daily.  . sodium bicarbonate 650 MG tablet Take 2 tablets (1,300 mg total) by mouth 3 (three) times daily. (Patient not taking: Reported on 10/03/2016)   No current facility-administered medications for this visit.     Allergies:   Glyburide and Methotrexate derivatives    Social History:  The patient  reports that he quit smoking about 40 years ago. His smoking use included Cigarettes. He has a 5.00 pack-year smoking history. He has never used smokeless tobacco. He reports that he does not drink alcohol or use drugs.   Family History:  The patient's family history includes Arthritis/Rheumatoid in his sister; Cancer in his mother.    ROS:  Please see the history of present illness. All other systems are reviewed and negative.    PHYSICAL EXAM: VS:  BP 131/70   Pulse 96   Ht 6' 2.5" (1.892 m)   Wt 216 lb 9.6 oz (98.2 kg)   BMI 27.44 kg/m  , BMI Body mass index is 27.44 kg/m. GEN: Well nourished, well developed, male in no acute distress  HEENT: normal for age  Neck: no JVD, no carotid bruit, no masses Cardiac: RRR; Soft murmur, no rubs, or gallops Respiratory: R>>L decreased breath sounds bases, normal work of breathing GI: soft, nontender, nondistended, + BS MS: no deformity or atrophy; no edema; distal pulses are 2+ in all 4 extremities   Skin: warm and dry, no  rash Neuro:  Strength and sensation are intact Psych: euthymic mood, full affect   EKG:  EKG is ordered today. The ekg ordered today demonstrates sinus rhythm, rate 96, no acute changes, normal intervals  ECHO 09/06/2016 Study Conclusions - Left ventricle: The cavity size was normal. Wall thickness was increased in a pattern of moderate LVH. Systolic function was vigorous. The estimated ejection fraction was in the range of 65% to 70%. Wall motion was normal; there were no regional wall motion abnormalities. Doppler parameters are consistent with abnormal left ventricular relaxation (grade 1 diastolicdysfunction). - Aortic valve: Valve area (VTI): 2.67 cm^2. Valve area (Vmax): 2.21 cm^2. Valve area (Vmean):  2.53 cm^2. - Atrial septum: No defect or patent foramen ovale was identified. - Pulmonary arteries: Systolic pressure was moderately increased. PA peak pressure: 40 mm Hg (S). - Technically adequate study.  Recent Labs: 09/07/2016: B Natriuretic Peptide 101.0 09/09/2016: TSH 0.457 09/12/2016: ALT 30 09/22/2016: Magnesium 2.5 09/29/2016: BUN 41; Creatinine, Ser 3.58; Hemoglobin 7.9; Platelets 255; Potassium 4.2; Sodium 136    Lipid Panel    Component Value Date/Time   CHOL 325 (H) 06/19/2013 0538   TRIG 145 06/19/2013 0538   HDL 46 06/19/2013 0538   CHOLHDL 7.1 06/19/2013 0538   VLDL 29 06/19/2013 0538   LDLCALC 250 (H) 06/19/2013 0538     Wt Readings from Last 3 Encounters:  10/03/16 216 lb 9.6 oz (98.2 kg)  09/29/16 218 lb 14.4 oz (99.3 kg)  09/02/16 225 lb (102.1 kg)     Other studies Reviewed: Additional studies/ records that were reviewed today include: Hospital records and testing.  ASSESSMENT AND PLAN:  1.  Paroxysmal atrial fibrillation: This was noted in the hospital in the setting of sepsis and no significant acute illness. He spontaneously converted to sinus rhythm and is apparently maintaining this.   Restart Cardizem CD 180 mg daily. If up  titration as needed, can be done as an outpatient. If he needs an increased dose of Cardizem, may have to decrease the clonidine to get this on board.  2. Chronic anticoagulation: He is not having any bleeding issues on the warfarin. This will be followed by his PCP, he has an appointment tomorrow.  3. Dyspnea on exertion, recent PNA and R effusion: Symptoms are steadily improving. He still has decreased breath sounds on the right. Chest x-ray to be rechecked tomorrow.   Current medicines are reviewed at length with the patient today.  The patient has concerns regarding medicines. Concerns were addressed  The following changes have been made:  Restart Cardizem CD 180 mg daily  Labs/ tests ordered today include:  No orders of the defined types were placed in this encounter.    Disposition:   FU with Dr. Percival Spanish  Signed, Barrett, Suanne Marker, PA-C  10/03/2016 2:28 PM    [place] Phone: [phone]; Fax: [phone]  This note was written with the assistance of speech recognition software. Please excuse any transcriptional errors.

## 2016-10-03 NOTE — Patient Instructions (Addendum)
Medication Instructions:  RESTART- Cardizem CD 180 mg daily  Labwork: None Ordered  Testing/Procedures: None Ordered  Follow-Up: Your physician recommends that you schedule a follow-up appointment in: 6 Months with Dr Percival Spanish   Any Other Special Instructions Will Be Listed Below (If Applicable).   If you need a refill on your cardiac medications before your next appointment, please call your pharmacy.

## 2016-10-04 DIAGNOSIS — N179 Acute kidney failure, unspecified: Secondary | ICD-10-CM | POA: Diagnosis not present

## 2016-10-04 DIAGNOSIS — I482 Chronic atrial fibrillation: Secondary | ICD-10-CM | POA: Diagnosis not present

## 2016-10-04 DIAGNOSIS — A4151 Sepsis due to Escherichia coli [E. coli]: Secondary | ICD-10-CM | POA: Diagnosis not present

## 2016-10-04 DIAGNOSIS — Z09 Encounter for follow-up examination after completed treatment for conditions other than malignant neoplasm: Secondary | ICD-10-CM | POA: Diagnosis not present

## 2016-10-04 DIAGNOSIS — J188 Other pneumonia, unspecified organism: Secondary | ICD-10-CM | POA: Diagnosis not present

## 2016-10-04 DIAGNOSIS — K810 Acute cholecystitis: Secondary | ICD-10-CM | POA: Diagnosis not present

## 2016-10-04 DIAGNOSIS — E1122 Type 2 diabetes mellitus with diabetic chronic kidney disease: Secondary | ICD-10-CM | POA: Diagnosis not present

## 2016-10-07 DIAGNOSIS — N179 Acute kidney failure, unspecified: Secondary | ICD-10-CM | POA: Diagnosis not present

## 2016-10-07 DIAGNOSIS — E1122 Type 2 diabetes mellitus with diabetic chronic kidney disease: Secondary | ICD-10-CM | POA: Diagnosis not present

## 2016-10-07 DIAGNOSIS — N183 Chronic kidney disease, stage 3 (moderate): Secondary | ICD-10-CM | POA: Diagnosis not present

## 2016-10-07 DIAGNOSIS — I482 Chronic atrial fibrillation: Secondary | ICD-10-CM | POA: Diagnosis not present

## 2016-10-07 DIAGNOSIS — K810 Acute cholecystitis: Secondary | ICD-10-CM | POA: Diagnosis not present

## 2016-10-08 DIAGNOSIS — M069 Rheumatoid arthritis, unspecified: Secondary | ICD-10-CM | POA: Diagnosis not present

## 2016-10-08 DIAGNOSIS — Z4803 Encounter for change or removal of drains: Secondary | ICD-10-CM | POA: Diagnosis not present

## 2016-10-08 DIAGNOSIS — A419 Sepsis, unspecified organism: Secondary | ICD-10-CM | POA: Diagnosis not present

## 2016-10-08 DIAGNOSIS — L89322 Pressure ulcer of left buttock, stage 2: Secondary | ICD-10-CM | POA: Diagnosis not present

## 2016-10-08 DIAGNOSIS — E559 Vitamin D deficiency, unspecified: Secondary | ICD-10-CM | POA: Diagnosis not present

## 2016-10-08 DIAGNOSIS — J90 Pleural effusion, not elsewhere classified: Secondary | ICD-10-CM | POA: Diagnosis not present

## 2016-10-08 DIAGNOSIS — N183 Chronic kidney disease, stage 3 (moderate): Secondary | ICD-10-CM | POA: Diagnosis not present

## 2016-10-08 DIAGNOSIS — Z794 Long term (current) use of insulin: Secondary | ICD-10-CM | POA: Diagnosis not present

## 2016-10-08 DIAGNOSIS — J189 Pneumonia, unspecified organism: Secondary | ICD-10-CM | POA: Diagnosis not present

## 2016-10-08 DIAGNOSIS — I480 Paroxysmal atrial fibrillation: Secondary | ICD-10-CM | POA: Diagnosis not present

## 2016-10-08 DIAGNOSIS — B9620 Unspecified Escherichia coli [E. coli] as the cause of diseases classified elsewhere: Secondary | ICD-10-CM | POA: Diagnosis not present

## 2016-10-08 DIAGNOSIS — Z7901 Long term (current) use of anticoagulants: Secondary | ICD-10-CM | POA: Diagnosis not present

## 2016-10-08 DIAGNOSIS — I739 Peripheral vascular disease, unspecified: Secondary | ICD-10-CM | POA: Diagnosis not present

## 2016-10-08 DIAGNOSIS — I129 Hypertensive chronic kidney disease with stage 1 through stage 4 chronic kidney disease, or unspecified chronic kidney disease: Secondary | ICD-10-CM | POA: Diagnosis not present

## 2016-10-08 DIAGNOSIS — E1122 Type 2 diabetes mellitus with diabetic chronic kidney disease: Secondary | ICD-10-CM | POA: Diagnosis not present

## 2016-10-08 DIAGNOSIS — Z87891 Personal history of nicotine dependence: Secondary | ICD-10-CM | POA: Diagnosis not present

## 2016-10-10 DIAGNOSIS — E1122 Type 2 diabetes mellitus with diabetic chronic kidney disease: Secondary | ICD-10-CM | POA: Diagnosis not present

## 2016-10-11 DIAGNOSIS — J90 Pleural effusion, not elsewhere classified: Secondary | ICD-10-CM | POA: Diagnosis not present

## 2016-10-11 DIAGNOSIS — Z4803 Encounter for change or removal of drains: Secondary | ICD-10-CM | POA: Diagnosis not present

## 2016-10-11 DIAGNOSIS — Z794 Long term (current) use of insulin: Secondary | ICD-10-CM | POA: Diagnosis not present

## 2016-10-11 DIAGNOSIS — L89322 Pressure ulcer of left buttock, stage 2: Secondary | ICD-10-CM | POA: Diagnosis not present

## 2016-10-11 DIAGNOSIS — E559 Vitamin D deficiency, unspecified: Secondary | ICD-10-CM | POA: Diagnosis not present

## 2016-10-11 DIAGNOSIS — A419 Sepsis, unspecified organism: Secondary | ICD-10-CM | POA: Diagnosis not present

## 2016-10-11 DIAGNOSIS — B9620 Unspecified Escherichia coli [E. coli] as the cause of diseases classified elsewhere: Secondary | ICD-10-CM | POA: Diagnosis not present

## 2016-10-11 DIAGNOSIS — Z7901 Long term (current) use of anticoagulants: Secondary | ICD-10-CM | POA: Diagnosis not present

## 2016-10-11 DIAGNOSIS — I129 Hypertensive chronic kidney disease with stage 1 through stage 4 chronic kidney disease, or unspecified chronic kidney disease: Secondary | ICD-10-CM | POA: Diagnosis not present

## 2016-10-11 DIAGNOSIS — E1122 Type 2 diabetes mellitus with diabetic chronic kidney disease: Secondary | ICD-10-CM | POA: Diagnosis not present

## 2016-10-11 DIAGNOSIS — N183 Chronic kidney disease, stage 3 (moderate): Secondary | ICD-10-CM | POA: Diagnosis not present

## 2016-10-11 DIAGNOSIS — I739 Peripheral vascular disease, unspecified: Secondary | ICD-10-CM | POA: Diagnosis not present

## 2016-10-11 DIAGNOSIS — M069 Rheumatoid arthritis, unspecified: Secondary | ICD-10-CM | POA: Diagnosis not present

## 2016-10-11 DIAGNOSIS — Z87891 Personal history of nicotine dependence: Secondary | ICD-10-CM | POA: Diagnosis not present

## 2016-10-11 DIAGNOSIS — I480 Paroxysmal atrial fibrillation: Secondary | ICD-10-CM | POA: Diagnosis not present

## 2016-10-11 DIAGNOSIS — J189 Pneumonia, unspecified organism: Secondary | ICD-10-CM | POA: Diagnosis not present

## 2016-10-15 ENCOUNTER — Ambulatory Visit
Admission: RE | Admit: 2016-10-15 | Discharge: 2016-10-15 | Disposition: A | Discharge status: Home / Self Care | Payer: PPO | Source: Ambulatory Visit | Attending: Radiology | Admitting: Radiology

## 2016-10-15 ENCOUNTER — Ambulatory Visit
Admission: RE | Admit: 2016-10-15 | Discharge: 2016-10-15 | Disposition: A | Discharge status: Home / Self Care | Payer: PPO | Source: Ambulatory Visit | Attending: Surgery | Admitting: Surgery

## 2016-10-15 ENCOUNTER — Encounter: Payer: Self-pay | Admitting: Radiology

## 2016-10-15 DIAGNOSIS — K8000 Calculus of gallbladder with acute cholecystitis without obstruction: Secondary | ICD-10-CM | POA: Diagnosis not present

## 2016-10-15 HISTORY — PX: IR GENERIC HISTORICAL: IMG1180011

## 2016-10-15 NOTE — Progress Notes (Signed)
Chief Complaint: Cholecystostomy  Referring Physician(s): Dr Derrek Gu  Supervising Physician: Aletta Edouard  History of Present Illness: Gary L Rozario Sr. is a 66 y.o. male who had been treated for pneumonia and E.Coli bacteremia at [facility].   He c/o abd pain and CT scan done on 09/07/2016 showed extensive pericholecystic fluid and changes consistent with acute cholecystitis.  He was transferred to [facility] and after eval by surgical team  He was deemed to high risk for OR given respiratory status/PNA.  Percutaneous cholecystostomy tube was placed by Dr Annamaria Boots on 09/08/2016  Gary Lang is doing well. He has no abdominal pain at all. He is tolerating a diet.  He reports continued copious output from his chole drain. He states he "empties the back about twice per day"  He also reports his stat-lock is broken.  He denies fever/chills/flu.  Past Medical History:  Diagnosis Date  . Chronic kidney disease   . Diabetes mellitus without complication ([place])   . Hypertension   . Rheumatoid arthritis ([place])   . Vitamin D deficiency    Hx: of    Past Surgical History:  Procedure Laterality Date  . AV FISTULA PLACEMENT Left 06/28/2013   Procedure: ARTERIOVENOUS (AV) FISTULA CREATION- Left arm with ultrasound guidance;  Surgeon: Rosetta Posner, MD;  Location: [facility] OR;  Service: Vascular;  Laterality: Left;  . COLONOSCOPY     hx: of  . HERNIA REPAIR    . INSERTION OF DIALYSIS CATHETER N/A 06/28/2013   Procedure: INSERTION OF DIALYSIS CATHETER;  Surgeon: Rosetta Posner, MD;  Location: [place];  Service: Vascular;  Laterality: N/A;  . IR GENERIC HISTORICAL  09/08/2016   IR PERC CHOLECYSTOSTOMY 09/08/2016 [name], MD [facility]  . IR GENERIC HISTORICAL  10/15/2016   IR RADIOLOGIST EVAL & MGMT 10/15/2016 Ardis Rowan, PA-C [facility] INTER[facility] RAD  . LIGATION OF COMPETING BRANCHES OF ARTERIOVENOUS FISTULA Left 10/12/2013   Procedure: LIGATION OF COMPETING BRANCHES OF  ARTERIOVENOUS FISTULA;  Surgeon: Elam Dutch, MD;  Location: [facility] OR;  Service: Vascular;  Laterality: Left;  . neck surgery      Allergies: Glyburide and Methotrexate derivatives  Medications: Prior to Admission medications   Medication Sig Start Date End Date Taking? Authorizing Provider  acetaminophen (TYLENOL) 500 MG tablet Take 2 tablets (1,000 mg total) by mouth 3 (three) times daily as needed for pain. 02/07/12   Zenia Resides, MD  calcium acetate (PHOSLO) 667 MG capsule Take 1 capsule (667 mg total) by mouth 3 (three) times daily with meals. 09/23/16   Velvet Bathe, MD  cholecalciferol (VITAMIN D) 1000 units tablet Take 1,000 Units by mouth daily.    Historical Provider, MD  cloNIDine (CATAPRES) 0.3 MG tablet Take 1 tablet (0.3 mg total) by mouth 3 (three) times daily. 09/23/16   Velvet Bathe, MD  dicyclomine (BENTYL) 20 MG tablet Take 1 tablet (20 mg total) by mouth 2 (two) times daily as needed for spasms. 09/03/16   Merryl Hacker, MD  diltiazem (CARDIZEM CD) 180 MG 24 hr capsule Take 1 capsule by mouth daily. 08/13/16   Historical Provider, MD  ferrous sulfate (FERROUSUL) 325 (65 FE) MG tablet Take 1 tablet (325 mg total) by mouth daily with breakfast. 09/29/16   Albertine Patricia, MD  furosemide (LASIX) 40 MG tablet Take 1 tablet (40 mg total) by mouth 2 (two) times daily. 09/23/16   Velvet Bathe, MD  insulin aspart (NOVOLOG FLEXPEN) 100 UNIT/ML FlexPen  Inject 7.5 Units into the skin 3 (three) times daily with meals. 09/23/16   Velvet Bathe, MD  Insulin Glargine (LANTUS) 100 UNIT/ML Solostar Pen Inject 35 Units into the skin every morning. Increase 5 u if am CBG is greater than 200.  Decrease 5 u if am CBG is less than 150 09/29/16   Silver Huguenin Elgergawy, MD  Insulin Pen Needle (PEN NEEDLES) 31G X 6 MM MISC Use 1 needle per injection as directed 07/01/13   Patrecia Pour, MD  Nutritional Supplements (FEEDING SUPPLEMENT, NEPRO CARB STEADY,) LIQD Take 237 mLs by mouth 2 (two) times daily  between meals. 09/30/16   Silver Huguenin Elgergawy, MD  Omega-3 Fatty Acids (FISH OIL PO) Take 1 capsule by mouth daily.    Historical Provider, MD  ondansetron (ZOFRAN ODT) 4 MG disintegrating tablet Take 1 tablet (4 mg total) by mouth every 8 (eight) hours as needed for nausea or vomiting. 09/03/16   Merryl Hacker, MD  senna-docusate (SENOKOT-S) 8.6-50 MG tablet Take 1 tablet by mouth at bedtime. 09/29/16   Silver Huguenin Elgergawy, MD  sodium bicarbonate 650 MG tablet Take 2 tablets (1,300 mg total) by mouth 3 (three) times daily. Patient not taking: Reported on 10/03/2016 09/23/16   Velvet Bathe, MD  TRULICITY 1.5 NK/5.3ZJ SOPN Inject 1 Units as directed once a week. Patient isn't sure how many units he takes. Patient takes on Wednesday's. 08/13/16   Historical Provider, MD  UNIFINE PENTIPS 31G X 5 MM MISC USE AS DIRECTED WITH INSULIN    Waldemar Dickens, MD  warfarin (COUMADIN) 2.5 MG tablet Take 1 tablet (2.5 mg total) by mouth daily. 09/29/16   Albertine Patricia, MD     Family History  Problem Relation Age of Onset  . Cancer Mother   . Arthritis/Rheumatoid Sister     Social History   Social History  . Marital status: Married    Spouse name: N/A  . Number of children: N/A  . Years of education: N/A   Social History Main Topics  . Smoking status: Former Smoker    Packs/day: 0.50    Years: 10.00    Types: Cigarettes    Quit date: 09/02/1976  . Smokeless tobacco: Never Used  . Alcohol use No  . Drug use: No  . Sexual activity: Not on file   Other Topics Concern  . Not on file   Social History Narrative  . No narrative on file    Review of Systems: A 12 point ROS discussed Review of Systems  Constitutional: Negative.   HENT: Negative.   Respiratory: Negative.   Cardiovascular: Negative.   Genitourinary: Negative.   Musculoskeletal: Negative.   Skin: Negative.   Neurological: Negative.   Hematological: Negative.   Psychiatric/Behavioral: Negative.     Vital Signs: BP (!)  95/48   Pulse 89   Temp 98.3 F (36.8 C)   SpO2 99%   Physical Exam  Constitutional: He is oriented to person, place, and time. He appears well-developed and well-nourished.  HENT:  Head: Normocephalic and atraumatic.  Eyes: EOM are normal.  Neck: Normal range of motion.  Abdominal: Soft. He exhibits no distension. There is no tenderness.    Drain in place RUQ. Site looks good. No leakage, no signs of infection.  Musculoskeletal: Normal range of motion.  Neurological: He is alert and oriented to person, place, and time.  Skin: Skin is warm and dry.  Psychiatric: He has a normal mood and affect. His behavior is  normal. Judgment and thought content normal.     Imaging: Dg Chest 1 View  Result Date: 09/28/2016 CLINICAL DATA:  Post thoracentesis EXAM: CHEST 1 VIEW COMPARISON:  09/26/2016 FINDINGS: Decreasing right pleural effusion following thoracentesis. No pneumothorax. Continued small to moderate right pleural effusion with right lower lobe atelectasis. Somewhat better aeration of the right base since prior study. No confluent opacity on the left. IMPRESSION: No pneumothorax following right thoracentesis. Small to moderate right pleural effusion with right base atelectasis, improved since prior study. Electronically Signed   By: Rolm Baptise M.D.   On: 09/28/2016 11:54   Dg Chest 2 View  Result Date: 09/29/2016 CLINICAL DATA:  Pleuro few status post thoracentesis. EXAM: CHEST  2 VIEW COMPARISON:  09/28/2016 FINDINGS: Cardiomediastinal silhouette is normal. Mediastinal contours appear intact. There is a stable in size right pleural effusion with compressive atelectasis of the right lower lobe. No evidence of pneumothorax. Osseous structures are without acute abnormality. Soft tissues are grossly normal. IMPRESSION: Stable in size right pleural effusion with compressive atelectasis of the right lower lobe. No evidence of pneumothorax. Electronically Signed   By: Fidela Salisbury M.D.    On: 09/29/2016 09:55   Ct Chest Wo Contrast  Result Date: 09/27/2016 CLINICAL DATA:  Pt admitted 09/05/16 for PNA; pt has since developed pleural effusions; pt states that he "just had the fluid removed, and that it re-accumulated within 48 hours"; pt denies any chest pain EXAM: CT CHEST WITHOUT CONTRAST TECHNIQUE: Multidetector CT imaging of the chest was performed following the standard protocol without IV contrast. COMPARISON:  Chest radiograph, 09/26/2016.  Chest CT, 09/07/2016. FINDINGS: Cardiovascular: Heart is normal in size. Great vessels are normal in caliber. No significant aortic atherosclerotic plaque. No significant coronary artery calcifications. Mediastinum/Nodes: No neck base or axillary masses or enlarged lymph nodes. Thyroid gland is unremarkable. No mediastinal masses or enlarged lymph nodes. No left hilar masses or adenopathy. Right hilum is partly obscured by contiguous lung opacity. Lungs/Pleura: Moderate right pleural effusion. There is atelectasis of the right lower lobe and partial atelectasis of the dependent right middle lobe. Some dependent secretions are noted in the bronchus intermedius and right lower lobe bronchi. There is mild subsegmental atelectasis in the dependent right upper lobe. Mild peribronchovascular linear and reticular opacities are noted in the left lower lobe there also likely atelectasis. No left pleural effusion. No pulmonary edema. No pneumothorax. Upper Abdomen: Trace amount of ascites adjacent to the liver. Otherwise unremarkable Musculoskeletal: No fracture or acute finding. No osteoblastic or osteolytic lesions. IMPRESSION: 1. Moderate right pleural effusion with complete atelectasis of the right lower lobe and partial atelectasis of the right middle lobe. Mild dependent subsegmental atelectasis noted in the dependent right upper lobe. No centrally obstructing mass is seen. Mucous/secretions are noted in the bronchus intermedius and right lower lobe segmental  bronchi. 2. Mild subsegmental atelectasis in the left lower lobe. 3. No convincing pneumonia.  No pulmonary edema. Electronically Signed   By: Lajean Manes M.D.   On: 09/27/2016 11:58   Dg Chest 2v Repeat Same Day  Result Date: 09/23/2016 CLINICAL DATA:  Acute onset of central chest pain, shortness of breath, wheezing, productive cough, congestion and nausea. Initial encounter. EXAM: CHEST  2 VIEW COMPARISON:  Chest radiograph performed 09/14/2016 FINDINGS: The lungs are well-aerated. A large right-sided pleural effusion is noted, with right-sided airspace opacification, concerning for asymmetric pulmonary edema. There is no evidence of pneumothorax. The heart is borderline normal in size. No acute osseous abnormalities are  seen. IMPRESSION: Large right-sided pleural effusion, with right-sided airspace opacification, concerning for asymmetric pulmonary edema. Electronically Signed   By: Garald Balding M.D.   On: 09/23/2016 19:36   Dg Chest Port 1 View  Result Date: 09/26/2016 CLINICAL DATA:  Pleural effusion EXAM: PORTABLE CHEST 1 VIEW COMPARISON:  Two days ago FINDINGS: Right pleural effusion that is moderate to large, increased from prior. Partly visible drain at the lower chest on the right is a cholecystostomy tube based on January 2018 abdominal CT. Normal heart size. Comparatively clear left lung. No pneumothorax. IMPRESSION: Moderate to large right pleural effusion. Fluid volume is mildly increased since 2 days ago. Electronically Signed   By: Monte Fantasia M.D.   On: 09/26/2016 07:59   Dg Chest Port 1 View  Result Date: 09/24/2016 CLINICAL DATA:  Pleural effusion EXAM: PORTABLE CHEST 1 VIEW COMPARISON:  09/23/2016 CXR, 09/07/2016 CT FINDINGS: Interval decrease in large right effusion/along the periphery of the right hemithorax. Right basilar atelectasis is identified. Chronic elevation of the right hemidiaphragm. Heart is top-normal in size. Aortic atherosclerosis is noted. There is mild  pulmonary vascular congestion. There is osteoarthritis about both AC and glenohumeral joints. IMPRESSION: Interval decrease in right effusion. Right basilar atelectasis. Aortic atherosclerosis. There is mild vascular congestion consistent with mild CHF. Electronically Signed   By: Ashley Royalty M.D.   On: 09/24/2016 18:06   Ir Radiologist Eval & Mgmt  Result Date: 10/15/2016 Please refer to "Notes" to see consult details.  US Thoracentesis Asp Pleural Space W/img Guide  Result Date: 09/28/2016 INDICATION: Patient with recent cholecystitis and right-sided pneumonia. Prior thoracentesis earlier this week. Recurrent right pleural effusion. Request is made for diagnostic and therapeutic thoracentesis. EXAM: ULTRASOUND GUIDED DIAGNOSTIC AND THERAPEUTIC THORACENTESIS MEDICATIONS: 1% lidocaine COMPLICATIONS: None immediate. PROCEDURE: An ultrasound guided thoracentesis was thoroughly discussed with the patient and questions answered. The benefits, risks, alternatives and complications were also discussed. The patient understands and wishes to proceed with the procedure. Written consent was obtained. Ultrasound was performed to localize and mark an adequate pocket of fluid in the right chest. The area was then prepped and draped in the normal sterile fashion. 1% Lidocaine was used for local anesthesia. Under ultrasound guidance a Safe-T-Centesis catheter was introduced. Thoracentesis was performed. The catheter was removed and a dressing applied. FINDINGS: A total of approximately 0.68 L of bloody fluid was removed. Samples were sent to the laboratory as requested by the clinical team. IMPRESSION: Successful ultrasound guided right thoracentesis yielding 0.68 L of pleural fluid. Read by: Saverio Danker, PA-C Electronically Signed   By: Markus Daft M.D.   On: 09/28/2016 11:44    Labs:  CBC:  Recent Labs  09/26/16 0533 09/27/16 0556 09/28/16 0501 09/29/16 1300  WBC 7.5 6.0 6.2 6.7  HGB 8.3* 8.4* 7.4* 7.9*    HCT 26.6* 25.9* 23.5* 23.9*  PLT 194 189 211 255    COAGS:  Recent Labs  09/26/16 0533 09/27/16 0556 09/28/16 0501 09/29/16 1300  INR 2.36 2.07 1.87 1.95    BMP:  Recent Labs  09/26/16 0533 09/27/16 0556 09/28/16 0501 09/29/16 1300  NA 140 139 139 136  K 3.6 3.7 3.7 4.2  CL 100* 100* 100* 98*  CO2 '30 30 30 28  '$ GLUCOSE 91 149* 111* 168*  BUN 60* 54* 47* 41*  CALCIUM 8.2* 8.1* 8.1* 8.4*  CREATININE 4.25* 4.04* 3.72* 3.58*  GFRNONAA 13* 14* 16* 16*  GFRAA 15* 16* 18* 19*    LIVER FUNCTION TESTS:  Recent Labs  09/07/16 1638 09/09/16 0340 09/10/16 0356  09/12/16 0228  09/25/16 0942 09/26/16 0533 09/27/16 0556 09/28/16 0501 09/29/16 1300  BILITOT 2.2* 1.2 1.1  --  1.6*  --   --   --   --   --   --   AST 64* 57* 30  --  21  --   --   --   --   --   --   ALT 45 47 39  --  30  --   --   --   --   --   --   ALKPHOS 107 128* 111  --  95  --   --   --   --   --   --   PROT 6.3* 6.4* 6.2*  --  6.4*  --  7.3  --   --  6.6  --   ALBUMIN 2.1* 1.7* 1.6*  < > 1.8*  1.7*  < > 1.7* 1.6* 1.7* 1.6* 1.7*  < > = values in this interval not displayed.  TUMOR MARKERS: No results for input(s): AFPTM, CEA, CA199, CHROMGRNA in the last 8760 hours.  Assessment:  Acute cholecystitis = not a surgical candidate at the time due to pneumonia  S/P Perc Chole by Dr. Annamaria Boots on 09/08/2016.  Drain injection done today shows patency of the cystic and common bile duct with contrast seen entering the duodenum.  Will cap the drain today. I have also replaced the malfunctioning stat-lock.  Patient and his wife understand if he should develop abdominal pain he is to reconnect the drain to a gravity bag (he was given a new one today)  Will arrange follow up with his surgeon.   Electronically Signed: Murrell Redden PA-C 10/15/2016, 2:29 PM   Please refer to Dr. Margaretmary Dys attestation of this note for management and plan.

## 2016-10-17 DIAGNOSIS — D631 Anemia in chronic kidney disease: Secondary | ICD-10-CM | POA: Diagnosis not present

## 2016-10-17 DIAGNOSIS — N183 Chronic kidney disease, stage 3 (moderate): Secondary | ICD-10-CM | POA: Diagnosis not present

## 2016-10-17 DIAGNOSIS — E1122 Type 2 diabetes mellitus with diabetic chronic kidney disease: Secondary | ICD-10-CM | POA: Diagnosis not present

## 2016-10-17 DIAGNOSIS — I482 Chronic atrial fibrillation: Secondary | ICD-10-CM | POA: Diagnosis not present

## 2016-10-17 DIAGNOSIS — K810 Acute cholecystitis: Secondary | ICD-10-CM | POA: Diagnosis not present

## 2016-10-18 DIAGNOSIS — M069 Rheumatoid arthritis, unspecified: Secondary | ICD-10-CM | POA: Diagnosis not present

## 2016-10-18 DIAGNOSIS — E1122 Type 2 diabetes mellitus with diabetic chronic kidney disease: Secondary | ICD-10-CM | POA: Diagnosis not present

## 2016-10-18 DIAGNOSIS — B9620 Unspecified Escherichia coli [E. coli] as the cause of diseases classified elsewhere: Secondary | ICD-10-CM | POA: Diagnosis not present

## 2016-10-18 DIAGNOSIS — I480 Paroxysmal atrial fibrillation: Secondary | ICD-10-CM | POA: Diagnosis not present

## 2016-10-18 DIAGNOSIS — Z7901 Long term (current) use of anticoagulants: Secondary | ICD-10-CM | POA: Diagnosis not present

## 2016-10-18 DIAGNOSIS — E559 Vitamin D deficiency, unspecified: Secondary | ICD-10-CM | POA: Diagnosis not present

## 2016-10-18 DIAGNOSIS — J189 Pneumonia, unspecified organism: Secondary | ICD-10-CM | POA: Diagnosis not present

## 2016-10-18 DIAGNOSIS — I129 Hypertensive chronic kidney disease with stage 1 through stage 4 chronic kidney disease, or unspecified chronic kidney disease: Secondary | ICD-10-CM | POA: Diagnosis not present

## 2016-10-18 DIAGNOSIS — Z87891 Personal history of nicotine dependence: Secondary | ICD-10-CM | POA: Diagnosis not present

## 2016-10-18 DIAGNOSIS — Z4803 Encounter for change or removal of drains: Secondary | ICD-10-CM | POA: Diagnosis not present

## 2016-10-18 DIAGNOSIS — A419 Sepsis, unspecified organism: Secondary | ICD-10-CM | POA: Diagnosis not present

## 2016-10-18 DIAGNOSIS — Z794 Long term (current) use of insulin: Secondary | ICD-10-CM | POA: Diagnosis not present

## 2016-10-18 DIAGNOSIS — L89322 Pressure ulcer of left buttock, stage 2: Secondary | ICD-10-CM | POA: Diagnosis not present

## 2016-10-18 DIAGNOSIS — I739 Peripheral vascular disease, unspecified: Secondary | ICD-10-CM | POA: Diagnosis not present

## 2016-10-18 DIAGNOSIS — N183 Chronic kidney disease, stage 3 (moderate): Secondary | ICD-10-CM | POA: Diagnosis not present

## 2016-10-18 DIAGNOSIS — J90 Pleural effusion, not elsewhere classified: Secondary | ICD-10-CM | POA: Diagnosis not present

## 2016-10-24 DIAGNOSIS — I129 Hypertensive chronic kidney disease with stage 1 through stage 4 chronic kidney disease, or unspecified chronic kidney disease: Secondary | ICD-10-CM | POA: Diagnosis not present

## 2016-10-24 DIAGNOSIS — Z794 Long term (current) use of insulin: Secondary | ICD-10-CM | POA: Diagnosis not present

## 2016-10-24 DIAGNOSIS — I480 Paroxysmal atrial fibrillation: Secondary | ICD-10-CM | POA: Diagnosis not present

## 2016-10-24 DIAGNOSIS — I739 Peripheral vascular disease, unspecified: Secondary | ICD-10-CM | POA: Diagnosis not present

## 2016-10-24 DIAGNOSIS — N183 Chronic kidney disease, stage 3 (moderate): Secondary | ICD-10-CM | POA: Diagnosis not present

## 2016-10-24 DIAGNOSIS — Z87891 Personal history of nicotine dependence: Secondary | ICD-10-CM | POA: Diagnosis not present

## 2016-10-24 DIAGNOSIS — E559 Vitamin D deficiency, unspecified: Secondary | ICD-10-CM | POA: Diagnosis not present

## 2016-10-24 DIAGNOSIS — A419 Sepsis, unspecified organism: Secondary | ICD-10-CM | POA: Diagnosis not present

## 2016-10-24 DIAGNOSIS — J189 Pneumonia, unspecified organism: Secondary | ICD-10-CM | POA: Diagnosis not present

## 2016-10-24 DIAGNOSIS — Z7901 Long term (current) use of anticoagulants: Secondary | ICD-10-CM | POA: Diagnosis not present

## 2016-10-24 DIAGNOSIS — Z4803 Encounter for change or removal of drains: Secondary | ICD-10-CM | POA: Diagnosis not present

## 2016-10-24 DIAGNOSIS — B9620 Unspecified Escherichia coli [E. coli] as the cause of diseases classified elsewhere: Secondary | ICD-10-CM | POA: Diagnosis not present

## 2016-10-24 DIAGNOSIS — E1122 Type 2 diabetes mellitus with diabetic chronic kidney disease: Secondary | ICD-10-CM | POA: Diagnosis not present

## 2016-10-24 DIAGNOSIS — L89322 Pressure ulcer of left buttock, stage 2: Secondary | ICD-10-CM | POA: Diagnosis not present

## 2016-10-24 DIAGNOSIS — M069 Rheumatoid arthritis, unspecified: Secondary | ICD-10-CM | POA: Diagnosis not present

## 2016-10-24 DIAGNOSIS — J90 Pleural effusion, not elsewhere classified: Secondary | ICD-10-CM | POA: Diagnosis not present

## 2016-10-24 MED FILL — UNIFINE PENTIPS 8MM 31G: 31G X 8 MM | 33 days supply | Qty: 100 | Fill #4

## 2016-10-25 ENCOUNTER — Other Ambulatory Visit: Payer: Self-pay | Admitting: Surgery

## 2016-10-25 DIAGNOSIS — K819 Cholecystitis, unspecified: Secondary | ICD-10-CM

## 2016-10-25 DIAGNOSIS — K810 Acute cholecystitis: Secondary | ICD-10-CM | POA: Diagnosis not present

## 2016-10-28 MED FILL — FREESTYLE LITE TEST STRIP: 25 days supply | Qty: 100 | Fill #5

## 2016-10-29 ENCOUNTER — Ambulatory Visit
Admission: RE | Admit: 2016-10-29 | Discharge: 2016-10-29 | Disposition: A | Discharge status: Home / Self Care | Payer: PPO | Source: Ambulatory Visit | Attending: Surgery | Admitting: Surgery

## 2016-10-29 ENCOUNTER — Encounter: Payer: Self-pay | Admitting: *Deleted

## 2016-10-29 DIAGNOSIS — Z4803 Encounter for change or removal of drains: Secondary | ICD-10-CM | POA: Diagnosis not present

## 2016-10-29 DIAGNOSIS — K819 Cholecystitis, unspecified: Secondary | ICD-10-CM

## 2016-10-29 HISTORY — PX: IR GENERIC HISTORICAL: IMG1180011

## 2016-10-29 MED FILL — TRULICITY 1.5 MG/0.5 ML PEN: 1.5 | 28 days supply | Qty: 2 | Fill #0

## 2016-10-29 NOTE — Progress Notes (Signed)
Referring Physician(s): Tsuei,Matthew  Chief Complaint: The patient is seen in follow up today s/p  percutaneous cholecystostomy on 09/08/16  History of present illness: Gary Lang is a 66 year old male with history of cholecystitis and placement of a percutaneous cholecystostomy on 09/08/16. He was last seen by our service on 10/15/16 at which time CT demonstrated decompressed gallbladder without evidence of biliary obstruction. The previously noted perihepatic fluid and intrahepatic fluid collections were nearly completely resolved with only small amount of fluid remaining just inferior to the liver. Cholangiogram performed at the time through the cholecystostomy tube revealed patent cystic duct and common bile duct with free flow of contrast into the duodenum without filling defect or extraction. The cholecystostomy drain was subsequently capped. He was evaluated by Dr. Georgette Dover on 10/25/16 and it was recommended that the cholecystostomy tube be removed. He is currently asymptomatic, specifically denying significant abdominal pain, chest pain, dyspnea, fever, nausea or vomiting. He reports no leaking around cholecystostomy insertion site. He presents today for cholecystostomy drain removal.   Past Medical History:  Diagnosis Date  . Chronic kidney disease   . Diabetes mellitus without complication ([place])   . Hypertension   . Rheumatoid arthritis ([place])   . Vitamin D deficiency    Hx: of    Past Surgical History:  Procedure Laterality Date  . AV FISTULA PLACEMENT Left 06/28/2013   Procedure: ARTERIOVENOUS (AV) FISTULA CREATION- Left arm with ultrasound guidance;  Surgeon: Rosetta Posner, MD;  Location: [facility] OR;  Service: Vascular;  Laterality: Left;  . COLONOSCOPY     hx: of  . HERNIA REPAIR    . INSERTION OF DIALYSIS CATHETER N/A 06/28/2013   Procedure: INSERTION OF DIALYSIS CATHETER;  Surgeon: Rosetta Posner, MD;  Location: [place];  Service: Vascular;  Laterality: N/A;  . IR GENERIC HISTORICAL   09/08/2016   IR PERC CHOLECYSTOSTOMY 09/08/2016 [name], MD [facility]  . IR GENERIC HISTORICAL  10/15/2016   IR RADIOLOGIST EVAL & MGMT 10/15/2016 Ardis Rowan, PA-C [facility] INTER[facility] RAD  . LIGATION OF COMPETING BRANCHES OF ARTERIOVENOUS FISTULA Left 10/12/2013   Procedure: LIGATION OF COMPETING BRANCHES OF ARTERIOVENOUS FISTULA;  Surgeon: Elam Dutch, MD;  Location: [facility] OR;  Service: Vascular;  Laterality: Left;  . neck surgery      Allergies: Glyburide and Methotrexate derivatives  Medications: Prior to Admission medications   Medication Sig Start Date End Date Taking? Authorizing Provider  acetaminophen (TYLENOL) 500 MG tablet Take 2 tablets (1,000 mg total) by mouth 3 (three) times daily as needed for pain. 02/07/12   Zenia Resides, MD  calcium acetate (PHOSLO) 667 MG capsule Take 1 capsule (667 mg total) by mouth 3 (three) times daily with meals. 09/23/16   Velvet Bathe, MD  cholecalciferol (VITAMIN D) 1000 units tablet Take 1,000 Units by mouth daily.    Historical Provider, MD  cloNIDine (CATAPRES) 0.3 MG tablet Take 1 tablet (0.3 mg total) by mouth 3 (three) times daily. 09/23/16   Velvet Bathe, MD  dicyclomine (BENTYL) 20 MG tablet Take 1 tablet (20 mg total) by mouth 2 (two) times daily as needed for spasms. 09/03/16   Merryl Hacker, MD  diltiazem (CARDIZEM CD) 180 MG 24 hr capsule Take 1 capsule by mouth daily. 08/13/16   Historical Provider, MD  ferrous sulfate (FERROUSUL) 325 (65 FE) MG tablet Take 1 tablet (325 mg total) by mouth daily with breakfast. 09/29/16   Albertine Patricia, MD  furosemide (LASIX) 40 MG tablet Take  1 tablet (40 mg total) by mouth 2 (two) times daily. 09/23/16   Velvet Bathe, MD  insulin aspart (NOVOLOG FLEXPEN) 100 UNIT/ML FlexPen Inject 7.5 Units into the skin 3 (three) times daily with meals. 09/23/16   Velvet Bathe, MD  Insulin Glargine (LANTUS) 100 UNIT/ML Solostar Pen Inject 35 Units into the skin every morning. Increase 5 u if am CBG is  greater than 200.  Decrease 5 u if am CBG is less than 150 09/29/16   Silver Huguenin Elgergawy, MD  Insulin Pen Needle (PEN NEEDLES) 31G X 6 MM MISC Use 1 needle per injection as directed 07/01/13   Patrecia Pour, MD  Nutritional Supplements (FEEDING SUPPLEMENT, NEPRO CARB STEADY,) LIQD Take 237 mLs by mouth 2 (two) times daily between meals. 09/30/16   Silver Huguenin Elgergawy, MD  Omega-3 Fatty Acids (FISH OIL PO) Take 1 capsule by mouth daily.    Historical Provider, MD  ondansetron (ZOFRAN ODT) 4 MG disintegrating tablet Take 1 tablet (4 mg total) by mouth every 8 (eight) hours as needed for nausea or vomiting. 09/03/16   Merryl Hacker, MD  senna-docusate (SENOKOT-S) 8.6-50 MG tablet Take 1 tablet by mouth at bedtime. 09/29/16   Silver Huguenin Elgergawy, MD  sodium bicarbonate 650 MG tablet Take 2 tablets (1,300 mg total) by mouth 3 (three) times daily. Patient not taking: Reported on 10/03/2016 09/23/16   Velvet Bathe, MD  TRULICITY 1.5 JH/4.1DE SOPN Inject 1 Units as directed once a week. Patient isn't sure how many units he takes. Patient takes on Wednesday's. 08/13/16   Historical Provider, MD  UNIFINE PENTIPS 31G X 5 MM MISC USE AS DIRECTED WITH INSULIN    Waldemar Dickens, MD  warfarin (COUMADIN) 2.5 MG tablet Take 1 tablet (2.5 mg total) by mouth daily. 09/29/16   Albertine Patricia, MD     Family History  Problem Relation Age of Onset  . Cancer Mother   . Arthritis/Rheumatoid Sister     Social History   Social History  . Marital status: Married    Spouse name: N/A  . Number of children: N/A  . Years of education: N/A   Social History Main Topics  . Smoking status: Former Smoker    Packs/day: 0.50    Years: 10.00    Types: Cigarettes    Quit date: 09/02/1976  . Smokeless tobacco: Never Used  . Alcohol use No  . Drug use: No  . Sexual activity: Not on file   Other Topics Concern  . Not on file   Social History Narrative  . No narrative on file     Vital Signs: BP (!) 157/71 (BP  Location: Left Arm, Patient Position: Sitting, Cuff Size: Normal)   Pulse 92   Temp 98.3 F (36.8 C) (Oral)   Resp 14   SpO2 100%   Physical Exam patient awake, alert. Chest with slightly diminished breath sounds right base, left clear; heart with regular rate and rhythm; abdomen soft, positive bowel sounds, nontender. Right upper quadrant drain intact, insertion site okay, drain capped.  Imaging: No results found.  Labs:  CBC:  Recent Labs  09/26/16 0533 09/27/16 0556 09/28/16 0501 09/29/16 1300  WBC 7.5 6.0 6.2 6.7  HGB 8.3* 8.4* 7.4* 7.9*  HCT 26.6* 25.9* 23.5* 23.9*  PLT 194 189 211 255    COAGS:  Recent Labs  09/26/16 0533 09/27/16 0556 09/28/16 0501 09/29/16 1300  INR 2.36 2.07 1.87 1.95    BMP:  Recent Labs  09/26/16 0533 09/27/16 0556 09/28/16 0501 09/29/16 1300  NA 140 139 139 136  K 3.6 3.7 3.7 4.2  CL 100* 100* 100* 98*  CO2 '30 30 30 28  '$ GLUCOSE 91 149* 111* 168*  BUN 60* 54* 47* 41*  CALCIUM 8.2* 8.1* 8.1* 8.4*  CREATININE 4.25* 4.04* 3.72* 3.58*  GFRNONAA 13* 14* 16* 16*  GFRAA 15* 16* 18* 19*    LIVER FUNCTION TESTS:  Recent Labs  09/07/16 1638 09/09/16 0340 09/10/16 0356  09/12/16 0228  09/25/16 0942 09/26/16 0533 09/27/16 0556 09/28/16 0501 09/29/16 1300  BILITOT 2.2* 1.2 1.1  --  1.6*  --   --   --   --   --   --   AST 64* 57* 30  --  21  --   --   --   --   --   --   ALT 45 47 39  --  30  --   --   --   --   --   --   ALKPHOS 107 128* 111  --  95  --   --   --   --   --   --   PROT 6.3* 6.4* 6.2*  --  6.4*  --  7.3  --   --  6.6  --   ALBUMIN 2.1* 1.7* 1.6*  < > 1.8*  1.7*  < > 1.7* 1.6* 1.7* 1.6* 1.7*  < > = values in this interval not displayed.  Assessment: Patient with prior history of cholecystitis, status post percutaneous cholecystostomy on 09/08/16. Recent cholangiogram has demonstrated patent cystic and common bile duct. Drain capped on 10/15/16. Seen by Dr. Georgette Dover on 10/25/16 who recommended cholecystostomy drain  removal. Patient currently doing well without acute complaints. Following discussion with Dr. Laurence Ferrari cholecystostomy drain removed in its entirety without immediate complications. Gauze dressing applied over site. Site care instructions reviewed with patient and wife.   Signed: D. Rowe Robert 10/29/2016, 9:19 AM   Please refer to Dr. Katrinka Blazing attestation of this note for management and plan.      Patient ID: Gary Noe Sr., male   DOB: 10/15/1950, 66 y.o.   MRN: [id number]

## 2016-10-30 DIAGNOSIS — E669 Obesity, unspecified: Secondary | ICD-10-CM | POA: Diagnosis not present

## 2016-10-30 DIAGNOSIS — K819 Cholecystitis, unspecified: Secondary | ICD-10-CM | POA: Diagnosis not present

## 2016-10-30 DIAGNOSIS — N2581 Secondary hyperparathyroidism of renal origin: Secondary | ICD-10-CM | POA: Diagnosis not present

## 2016-10-30 DIAGNOSIS — N183 Chronic kidney disease, stage 3 (moderate): Secondary | ICD-10-CM | POA: Diagnosis not present

## 2016-10-30 DIAGNOSIS — R7881 Bacteremia: Secondary | ICD-10-CM | POA: Diagnosis not present

## 2016-10-30 DIAGNOSIS — I4891 Unspecified atrial fibrillation: Secondary | ICD-10-CM | POA: Diagnosis not present

## 2016-10-30 DIAGNOSIS — I770 Arteriovenous fistula, acquired: Secondary | ICD-10-CM | POA: Diagnosis not present

## 2016-10-31 DIAGNOSIS — Z794 Long term (current) use of insulin: Secondary | ICD-10-CM | POA: Diagnosis not present

## 2016-10-31 DIAGNOSIS — B9620 Unspecified Escherichia coli [E. coli] as the cause of diseases classified elsewhere: Secondary | ICD-10-CM | POA: Diagnosis not present

## 2016-10-31 DIAGNOSIS — M069 Rheumatoid arthritis, unspecified: Secondary | ICD-10-CM | POA: Diagnosis not present

## 2016-10-31 DIAGNOSIS — L89322 Pressure ulcer of left buttock, stage 2: Secondary | ICD-10-CM | POA: Diagnosis not present

## 2016-10-31 DIAGNOSIS — J90 Pleural effusion, not elsewhere classified: Secondary | ICD-10-CM | POA: Diagnosis not present

## 2016-10-31 DIAGNOSIS — Z87891 Personal history of nicotine dependence: Secondary | ICD-10-CM | POA: Diagnosis not present

## 2016-10-31 DIAGNOSIS — Z4803 Encounter for change or removal of drains: Secondary | ICD-10-CM | POA: Diagnosis not present

## 2016-10-31 DIAGNOSIS — I129 Hypertensive chronic kidney disease with stage 1 through stage 4 chronic kidney disease, or unspecified chronic kidney disease: Secondary | ICD-10-CM | POA: Diagnosis not present

## 2016-10-31 DIAGNOSIS — I480 Paroxysmal atrial fibrillation: Secondary | ICD-10-CM | POA: Diagnosis not present

## 2016-10-31 DIAGNOSIS — A419 Sepsis, unspecified organism: Secondary | ICD-10-CM | POA: Diagnosis not present

## 2016-10-31 DIAGNOSIS — I739 Peripheral vascular disease, unspecified: Secondary | ICD-10-CM | POA: Diagnosis not present

## 2016-10-31 DIAGNOSIS — E559 Vitamin D deficiency, unspecified: Secondary | ICD-10-CM | POA: Diagnosis not present

## 2016-10-31 DIAGNOSIS — N183 Chronic kidney disease, stage 3 (moderate): Secondary | ICD-10-CM | POA: Diagnosis not present

## 2016-10-31 DIAGNOSIS — E1122 Type 2 diabetes mellitus with diabetic chronic kidney disease: Secondary | ICD-10-CM | POA: Diagnosis not present

## 2016-10-31 DIAGNOSIS — J189 Pneumonia, unspecified organism: Secondary | ICD-10-CM | POA: Diagnosis not present

## 2016-10-31 DIAGNOSIS — Z7901 Long term (current) use of anticoagulants: Secondary | ICD-10-CM | POA: Diagnosis not present

## 2016-11-05 DIAGNOSIS — A419 Sepsis, unspecified organism: Secondary | ICD-10-CM | POA: Diagnosis not present

## 2016-11-05 DIAGNOSIS — J189 Pneumonia, unspecified organism: Secondary | ICD-10-CM | POA: Diagnosis not present

## 2016-11-05 DIAGNOSIS — J90 Pleural effusion, not elsewhere classified: Secondary | ICD-10-CM | POA: Diagnosis not present

## 2016-11-05 DIAGNOSIS — N183 Chronic kidney disease, stage 3 (moderate): Secondary | ICD-10-CM | POA: Diagnosis not present

## 2016-11-05 DIAGNOSIS — L89322 Pressure ulcer of left buttock, stage 2: Secondary | ICD-10-CM | POA: Diagnosis not present

## 2016-11-05 DIAGNOSIS — Z87891 Personal history of nicotine dependence: Secondary | ICD-10-CM | POA: Diagnosis not present

## 2016-11-05 DIAGNOSIS — I739 Peripheral vascular disease, unspecified: Secondary | ICD-10-CM | POA: Diagnosis not present

## 2016-11-05 DIAGNOSIS — Z7901 Long term (current) use of anticoagulants: Secondary | ICD-10-CM | POA: Diagnosis not present

## 2016-11-05 DIAGNOSIS — I129 Hypertensive chronic kidney disease with stage 1 through stage 4 chronic kidney disease, or unspecified chronic kidney disease: Secondary | ICD-10-CM | POA: Diagnosis not present

## 2016-11-05 DIAGNOSIS — E1122 Type 2 diabetes mellitus with diabetic chronic kidney disease: Secondary | ICD-10-CM | POA: Diagnosis not present

## 2016-11-05 DIAGNOSIS — E559 Vitamin D deficiency, unspecified: Secondary | ICD-10-CM | POA: Diagnosis not present

## 2016-11-05 DIAGNOSIS — M069 Rheumatoid arthritis, unspecified: Secondary | ICD-10-CM | POA: Diagnosis not present

## 2016-11-05 DIAGNOSIS — Z4803 Encounter for change or removal of drains: Secondary | ICD-10-CM | POA: Diagnosis not present

## 2016-11-05 DIAGNOSIS — Z794 Long term (current) use of insulin: Secondary | ICD-10-CM | POA: Diagnosis not present

## 2016-11-05 DIAGNOSIS — I480 Paroxysmal atrial fibrillation: Secondary | ICD-10-CM | POA: Diagnosis not present

## 2016-11-05 DIAGNOSIS — B9620 Unspecified Escherichia coli [E. coli] as the cause of diseases classified elsewhere: Secondary | ICD-10-CM | POA: Diagnosis not present

## 2016-11-07 MED FILL — LANTUS SOLOSTAR 100 UNITS/M: 100 | 27 days supply | Qty: 15 | Fill #1

## 2016-11-12 MED FILL — NOVOLOG FLEXPEN SYRINGE: 100 | 35 days supply | Qty: 15 | Fill #0

## 2016-11-14 DIAGNOSIS — I482 Chronic atrial fibrillation: Secondary | ICD-10-CM | POA: Diagnosis not present

## 2016-11-15 DIAGNOSIS — Z8719 Personal history of other diseases of the digestive system: Secondary | ICD-10-CM | POA: Diagnosis not present

## 2016-11-25 MED FILL — TRULICITY 1.5 MG/0.5 ML PEN: 1.5 | 28 days supply | Qty: 2 | Fill #1

## 2016-11-28 DIAGNOSIS — I482 Chronic atrial fibrillation: Secondary | ICD-10-CM | POA: Diagnosis not present

## 2016-12-03 ENCOUNTER — Telehealth: Payer: Self-pay | Admitting: Physician Assistant

## 2016-12-03 MED FILL — FREESTYLE LITE TEST STRIP: 25 days supply | Qty: 100 | Fill #0

## 2016-12-03 NOTE — Telephone Encounter (Signed)
He is at acceptable risk for the surgery, as long as his volume status and VS are stable. Watch volume in the perioperative period. Keep Cardizem on board, try not to miss any doses. Ok to hold coumadin for the surgery. No further cardiac workup

## 2016-12-03 NOTE — Telephone Encounter (Signed)
Lm2cb 

## 2016-12-03 NOTE — Telephone Encounter (Signed)
Faxed via Epic to Dr Juel Burrow office

## 2016-12-03 NOTE — Telephone Encounter (Signed)
Pt Wife(EC) notified she will keep this in mind until his surgery

## 2016-12-03 NOTE — Telephone Encounter (Signed)
Request for surgical clearance:  1. What type of surgery is being performed? Gall Bladder removal  2. When is this surgery scheduled? N/A   3. Are there any medications that need to be held prior to surgery and how long? Coumadin  4. Name of physician performing surgery? Dr. Nevada Crane   5. What is your office phone and fax number? Office # [phone] fax [phone]  If you have any questions, call office # and ask for [facility]. Thanks.

## 2016-12-05 ENCOUNTER — Telehealth: Payer: Self-pay | Admitting: Physician Assistant

## 2016-12-05 MED ORDER — DILTIAZEM HCL ER COATED BEADS 180 MG PO CP24: 180.0000 mg | ORAL_CAPSULE | Freq: Every day | ORAL | 4 refills | Status: DC

## 2016-12-05 NOTE — Telephone Encounter (Signed)
Returned the phone call to the patient's wife and stated that he should be taking the Cardizem 180 mg daily per the visit with Rosaria Ferries. A refill was called in, enough to get him through the next appointment with Dr. Percival Spanish which should take place in July. She stated that she understood and would stop the Cartia 240 mg and start the Cardizem 180 mg.

## 2016-12-05 NOTE — Telephone Encounter (Signed)
Returned phone call to the wife. She stated that the patient has not been taking the Cardizem 180 mg but has been taking Cartia XT 240 mg once a day. She stated that this was prescribed when he was in the hospital. I did not see a record of the Cartia but only the Cardizem. His PCP wants to know if he should be on both. He saw Rosaria Ferries February 1st who stated to restart the Cardizem 180 mg. Please advise.

## 2016-12-05 NOTE — Telephone Encounter (Signed)
Per Rhonda's note, he should stay on the 180 mg dose (cardizem and Cartia are the same drug).  If he needs further titrations we will do that at future visits

## 2016-12-05 NOTE — Telephone Encounter (Signed)
Patient wife calling states that Dr. Josue Hector office would like to know if patient should take Cartia and Cardizem medication at the same time. Please call Dr. Nevada Crane to discuss at [phone]. Thanks.

## 2016-12-23 MED FILL — TRULICITY 1.5 MG/0.5 ML PEN: 1.5 | 28 days supply | Qty: 2 | Fill #2

## 2016-12-26 DIAGNOSIS — I482 Chronic atrial fibrillation: Secondary | ICD-10-CM | POA: Diagnosis not present

## 2016-12-26 DIAGNOSIS — Z7901 Long term (current) use of anticoagulants: Secondary | ICD-10-CM | POA: Diagnosis not present

## 2016-12-30 ENCOUNTER — Telehealth: Payer: Self-pay | Admitting: Physician Assistant

## 2016-12-30 NOTE — Telephone Encounter (Signed)
Patient wife calling in regards to document that states patient can discontinue coumadin as long as patient was taking the Cardizem medication. Patient wife calling to see if Rosaria Ferries has faxed over that form to Dr. Meade Maw. Dr. Juel Burrow phone number is [phone], thanks.

## 2016-12-30 NOTE — Telephone Encounter (Signed)
Left message to call back  Will send via Epic the ok to hold Coumadin prior to procedure telephone note from 12/03/16

## 2016-12-30 NOTE — Telephone Encounter (Signed)
Spoke with patient and advised he is to continue Warfarin and hold only prior to procedure, patient verbalized understanding

## 2016-12-30 NOTE — Telephone Encounter (Signed)
Follow up  Pts mom called back to speak with nurse  Please f/u

## 2017-01-03 MED FILL — LANTUS SOLOSTAR 100 UNITS/M: 100 | 27 days supply | Qty: 15 | Fill #2

## 2017-01-09 DIAGNOSIS — Z7901 Long term (current) use of anticoagulants: Secondary | ICD-10-CM | POA: Diagnosis not present

## 2017-01-09 DIAGNOSIS — I482 Chronic atrial fibrillation: Secondary | ICD-10-CM | POA: Diagnosis not present

## 2017-01-29 MED FILL — FREESTYLE LITE TEST STRIP: 25 days supply | Qty: 100 | Fill #1

## 2017-01-29 MED FILL — LANTUS SOLOSTAR 100 UNITS/M: 100 | 27 days supply | Qty: 15 | Fill #3

## 2017-01-29 MED FILL — NOVOLOG FLEXPEN SYRINGE: 100 | 35 days supply | Qty: 15 | Fill #1

## 2017-01-29 MED FILL — TRULICITY 1.5 MG/0.5 ML PEN: 1.5 | 28 days supply | Qty: 2 | Fill #3

## 2017-04-04 MED FILL — LANTUS SOLOSTAR 100 UNITS/M: 100 | 27 days supply | Qty: 15 | Fill #4

## 2017-04-04 MED FILL — TRULICITY 1.5 MG/0.5 ML PEN: 1.5 | 28 days supply | Qty: 2 | Fill #4

## 2017-04-04 MED FILL — NOVOLOG FLEXPEN SYRINGE: 100 | 35 days supply | Qty: 15 | Fill #2

## 2017-04-10 MED FILL — UNIFINE PENTIPS 8MM 31G: 31G X 8 MM | 30 days supply | Qty: 100 | Fill #0

## 2017-05-01 MED FILL — TRULICITY 1.5 MG/0.5 ML PEN: 1.5 | 28 days supply | Qty: 2 | Fill #5

## 2017-05-12 DIAGNOSIS — Z7901 Long term (current) use of anticoagulants: Secondary | ICD-10-CM | POA: Diagnosis not present

## 2017-05-12 DIAGNOSIS — I482 Chronic atrial fibrillation: Secondary | ICD-10-CM | POA: Diagnosis not present

## 2017-05-12 DIAGNOSIS — Z6829 Body mass index (BMI) 29.0-29.9, adult: Secondary | ICD-10-CM | POA: Diagnosis not present

## 2017-05-12 DIAGNOSIS — M059 Rheumatoid arthritis with rheumatoid factor, unspecified: Secondary | ICD-10-CM | POA: Diagnosis not present

## 2017-05-12 DIAGNOSIS — L309 Dermatitis, unspecified: Secondary | ICD-10-CM | POA: Diagnosis not present

## 2017-05-12 DIAGNOSIS — N183 Chronic kidney disease, stage 3 (moderate): Secondary | ICD-10-CM | POA: Diagnosis not present

## 2017-05-12 DIAGNOSIS — Z23 Encounter for immunization: Secondary | ICD-10-CM | POA: Diagnosis not present

## 2017-05-19 MED FILL — FREESTYLE LITE TEST STRIP: 25 days supply | Qty: 100 | Fill #2

## 2017-06-09 MED FILL — TRULICITY 1.5 MG/0.5 ML PEN: 1.5 | 28 days supply | Qty: 2 | Fill #0

## 2017-07-14 ENCOUNTER — Other Ambulatory Visit: Payer: Self-pay | Admitting: Physician Assistant

## 2017-07-14 NOTE — Telephone Encounter (Signed)
Rx has been sent to the pharmacy electronically. ° °

## 2017-07-18 ENCOUNTER — Emergency Department (HOSPITAL_COMMUNITY): Payer: PPO

## 2017-07-18 ENCOUNTER — Encounter (HOSPITAL_COMMUNITY): Payer: Self-pay

## 2017-07-18 ENCOUNTER — Emergency Department (HOSPITAL_COMMUNITY)
Admission: EM | Admit: 2017-07-18 | Discharge: 2017-07-18 | Disposition: A | Discharge status: Home / Self Care | Payer: PPO | Attending: Emergency Medicine | Admitting: Emergency Medicine

## 2017-07-18 DIAGNOSIS — R1033 Periumbilical pain: Secondary | ICD-10-CM | POA: Diagnosis not present

## 2017-07-18 DIAGNOSIS — N186 End stage renal disease: Secondary | ICD-10-CM | POA: Diagnosis not present

## 2017-07-18 DIAGNOSIS — R1110 Vomiting, unspecified: Secondary | ICD-10-CM | POA: Diagnosis not present

## 2017-07-18 DIAGNOSIS — E119 Type 2 diabetes mellitus without complications: Secondary | ICD-10-CM | POA: Insufficient documentation

## 2017-07-18 DIAGNOSIS — Z79899 Other long term (current) drug therapy: Secondary | ICD-10-CM | POA: Insufficient documentation

## 2017-07-18 DIAGNOSIS — R809 Proteinuria, unspecified: Secondary | ICD-10-CM | POA: Insufficient documentation

## 2017-07-18 DIAGNOSIS — I120 Hypertensive chronic kidney disease with stage 5 chronic kidney disease or end stage renal disease: Secondary | ICD-10-CM | POA: Diagnosis not present

## 2017-07-18 DIAGNOSIS — R109 Unspecified abdominal pain: Secondary | ICD-10-CM | POA: Diagnosis not present

## 2017-07-18 DIAGNOSIS — Z794 Long term (current) use of insulin: Secondary | ICD-10-CM | POA: Insufficient documentation

## 2017-07-18 DIAGNOSIS — R112 Nausea with vomiting, unspecified: Secondary | ICD-10-CM | POA: Diagnosis not present

## 2017-07-18 LAB — CBC WITH DIFFERENTIAL/PLATELET
Basophils Absolute: 0 10*3/uL (ref 0.0–0.1)
Basophils Relative: 0 %
EOS ABS: 0.1 10*3/uL (ref 0.0–0.7)
EOS PCT: 1 %
HCT: 37 % — ABNORMAL LOW (ref 39.0–52.0)
Hemoglobin: 12 g/dL — ABNORMAL LOW (ref 13.0–17.0)
LYMPHS ABS: 1.9 10*3/uL (ref 0.7–4.0)
Lymphocytes Relative: 17 %
MCH: 26.8 pg (ref 26.0–34.0)
MCHC: 32.4 g/dL (ref 30.0–36.0)
MCV: 82.6 fL (ref 78.0–100.0)
MONOS PCT: 5 %
Monocytes Absolute: 0.5 10*3/uL (ref 0.1–1.0)
Neutro Abs: 8.6 10*3/uL — ABNORMAL HIGH (ref 1.7–7.7)
Neutrophils Relative %: 77 %
PLATELETS: 237 10*3/uL (ref 150–400)
RBC: 4.48 MIL/uL (ref 4.22–5.81)
RDW: 13.4 % (ref 11.5–15.5)
WBC: 11.1 10*3/uL — ABNORMAL HIGH (ref 4.0–10.5)

## 2017-07-18 LAB — COMPREHENSIVE METABOLIC PANEL
ALT: 13 U/L — ABNORMAL LOW (ref 17–63)
ANION GAP: 6 (ref 5–15)
AST: 17 U/L (ref 15–41)
Albumin: 3.6 g/dL (ref 3.5–5.0)
Alkaline Phosphatase: 90 U/L (ref 38–126)
BUN: 20 mg/dL (ref 6–20)
CHLORIDE: 104 mmol/L (ref 101–111)
CO2: 25 mmol/L (ref 22–32)
Calcium: 9.2 mg/dL (ref 8.9–10.3)
Creatinine, Ser: 1.5 mg/dL — ABNORMAL HIGH (ref 0.61–1.24)
GFR calc non Af Amer: 46 mL/min — ABNORMAL LOW (ref >60)
GFR, EST AFRICAN AMERICAN: 54 mL/min — AB (ref >60)
GLUCOSE: 262 mg/dL — AB (ref 65–99)
Potassium: 3.9 mmol/L (ref 3.5–5.1)
SODIUM: 135 mmol/L (ref 135–145)
Total Bilirubin: 0.6 mg/dL (ref 0.3–1.2)
Total Protein: 7.5 g/dL (ref 6.5–8.1)

## 2017-07-18 LAB — URINALYSIS, ROUTINE W REFLEX MICROSCOPIC
Bacteria, UA: NONE SEEN
Bilirubin Urine: NEGATIVE
Glucose, UA: >=500 mg/dL — AB
HGB URINE DIPSTICK: NEGATIVE
KETONES UR: NEGATIVE mg/dL
Leukocytes, UA: NEGATIVE
Nitrite: NEGATIVE
PROTEIN: 100 mg/dL — AB
Specific Gravity, Urine: 1.024 (ref 1.005–1.030)
pH: 5 (ref 5.0–8.0)

## 2017-07-18 LAB — LIPASE, BLOOD: Lipase: 32 U/L (ref 11–51)

## 2017-07-18 MED ORDER — IOPAMIDOL (ISOVUE-300) INJECTION 61%
100.0000 mL | Freq: Once | INTRAVENOUS | Status: AC | PRN
  Administered 2017-07-18: 100 mL via INTRAVENOUS

## 2017-07-18 MED ORDER — MORPHINE SULFATE (PF) 4 MG/ML IV SOLN
2.0000 mg | Freq: Once | INTRAVENOUS | Status: AC
  Administered 2017-07-18: 2 mg via INTRAVENOUS
  Filled 2017-07-18: qty 1

## 2017-07-18 MED ORDER — DICYCLOMINE HCL 20 MG PO TABS: 20.0000 mg | ORAL_TABLET | Freq: Two times a day (BID) | ORAL | 0 refills | Status: DC

## 2017-07-18 MED ORDER — DICYCLOMINE HCL 10 MG PO CAPS
20.0000 mg | ORAL_CAPSULE | Freq: Once | ORAL | Status: AC
  Administered 2017-07-18: 20 mg via ORAL
  Filled 2017-07-18: qty 2

## 2017-07-18 NOTE — ED Notes (Signed)
ED Provider at bedside. 

## 2017-07-18 NOTE — ED Provider Notes (Signed)
[facility] EMERGENCY DEPARTMENT Provider Note   CSN: [id number] Arrival date & time: 07/18/17  1742     History   Chief Complaint Chief Complaint  Patient presents with  . Abdominal Pain    HPI Gary Lang 37 Sr. is a 66 y.o. male.  HPI  66 y/o male - hx of CKD - had a dialysis access placed in the Lang forearm - didn;'t have to stay on dialysis - he makes urine - has never had abd surgyer.  This AM he awoke with pain in the mid abdomen without radiation - no pain in the back or the chest, no diarrhea and no fevers but he has had a couple of episodes of vomiting this week.  He reports that the pain is been constant throughout the day, nothing seems to make it better or worse, he has no significant history of alcohol use pancreatitis gallbladder disease or urinary infections.  He denies any dysuria, no rashes, no medication for the pain prior to arrival.  Past Medical History:  Diagnosis Date  . Chronic kidney disease   . Diabetes mellitus without complication ([place])   . Hypertension   . Rheumatoid arthritis ([place])   . Vitamin D deficiency    Hx: of    Patient Active Problem List   Diagnosis Date Noted  . Pressure injury of skin 09/29/2016  . Pleural effusion   . PAF (paroxysmal atrial fibrillation) ([place])   . Acute abdominal pain   . Fever   . Abdominal distention, non-gaseous   . Acute cholecystitis   . AKI (acute kidney injury) ([place])   . Acute respiratory failure with hypoxia ([place])   . Community acquired pneumonia of right lower lobe of lung ([place]) 09/05/2016  . Elevated troponin   . Sepsis ([place])   . Hypokalemia   . Erectile dysfunction 02/01/2014  . Rash and nonspecific skin eruption 11/04/2013  . Esophageal reflux 11/04/2013  . End stage renal disease ([place]) 09/23/2013  . AVF (arteriovenous fistula) ([place]) 08/17/2013  . Glomerulonephritis, focal sclerosing 07/06/2013  . Diabetes mellitus ([place]) 02/07/2012  . Hypertension 09/02/2008  . Rheumatoid arthritis(714.0)  09/02/1996    Past Surgical History:  Procedure Laterality Date  . ARTERIOVENOUS (AV) FISTULA CREATION- Left arm with ultrasound guidance Left 06/28/2013   Performed by Early, Arvilla Meres, MD at [facility]     hx: of  . HERNIA REPAIR    . INSERTION OF DIALYSIS CATHETER N/A 06/28/2013   Performed by Rosetta Posner, MD at [facility]  . IR GENERIC HISTORICAL  09/08/2016   IR PERC CHOLECYSTOSTOMY 09/08/2016 [name], MD [facility]  . IR GENERIC HISTORICAL  10/15/2016   IR RADIOLOGIST EVAL & MGMT 10/15/2016 Ardis Rowan, PA-C [facility] INTER[facility]  . IR GENERIC HISTORICAL  10/29/2016   IR RADIOLOGIST EVAL & MGMT 10/29/2016 Jacqulynn Cadet, MD [facility]  . LIGATION OF COMPETING BRANCHES OF ARTERIOVENOUS FISTULA Left 10/12/2013   Performed by Elam Dutch, MD at [facility]  . neck surgery         Home Medications    Prior to Admission medications   Medication Sig Start Date End Date Taking? Authorizing Provider  acetaminophen (TYLENOL) 500 MG tablet Take 2 tablets (1,000 mg total) by mouth 3 (three) times daily as needed for pain. 02/07/12  Yes Hensel, Jamal Collin, MD  bismuth subsalicylate (KAOPECTATE) 262 MG/15ML suspension Take 30 mLs every 6 (six) hours as needed by mouth for indigestion or  diarrhea or loose stools.   Yes [provider]  cholecalciferol (VITAMIN D) 1000 units tablet Take 1,000 Units by mouth daily.   Yes [provider]  cloNIDine (CATAPRES) 0.3 MG tablet Take 1 tablet (0.3 mg total) by mouth 3 (three) times daily. Patient taking differently: Take 0.3 mg daily by mouth.  09/23/16  Yes Velvet Bathe, MD  diltiazem (CARTIA XT) 180 MG 24 hr capsule Take 1 capsule (180 mg total) daily by mouth. Need appt 07/14/17  Yes Barrett, Evelene Croon, PA-C  ferrous sulfate (FERROUSUL) 325 (65 FE) MG tablet Take 1 tablet (325 mg total) by mouth daily with breakfast. 09/29/16  Yes Elgergawy, Silver Huguenin, MD  furosemide (LASIX) 40 MG tablet Take 1 tablet (40 mg total)  by mouth 2 (two) times daily. Patient taking differently: Take 40 mg daily by mouth.  09/23/16  Yes Velvet Bathe, MD  insulin aspart (NOVOLOG FLEXPEN) 100 UNIT/ML FlexPen Inject 7.5 Units into the skin 3 (three) times daily with meals. Patient taking differently: Inject 8 Units 3 (three) times daily with meals into the skin.  09/23/16  Yes Velvet Bathe, MD  Insulin Glargine (LANTUS) 100 UNIT/ML Solostar Pen Inject 35 Units into the skin every morning. Increase 5 u if am CBG is greater than 200.  Decrease 5 u if am CBG is less than 150 Patient taking differently: Inject 30 Units every morning into the skin.  09/29/16  Yes Elgergawy, Silver Huguenin, MD  predniSONE (DELTASONE) 10 MG tablet Take 10 mg every other day by mouth.  06/21/17  Yes [provider]  TRULICITY 1.5 WP/8.0DX SOPN Inject 0.5 mLs once a week as directed.  08/13/16  Yes [provider]  dicyclomine (BENTYL) 20 MG tablet Take 1 tablet (20 mg total) 2 (two) times daily by mouth. 07/18/17   Noemi Chapel, MD  Insulin Pen Needle (PEN NEEDLES) 31G X 6 MM MISC Use 1 needle per injection as directed 07/01/13   Patrecia Pour, MD  UNIFINE PENTIPS 31G X 5 MM MISC USE AS DIRECTED WITH INSULIN    Waldemar Dickens, MD    Family History Family History  Problem Relation Age of Onset  . Cancer Mother   . Arthritis/Rheumatoid Sister     Social History Social History   Tobacco Use  . Smoking status: Former Smoker    Packs/day: 0.50    Years: 10.00    Pack years: 5.00    Types: Cigarettes    Last attempt to quit: 09/02/1976    Years since quitting: 40.9  . Smokeless tobacco: Never Used  Substance Use Topics  . Alcohol use: No  . Drug use: No     Allergies   Glyburide and Methotrexate derivatives   Review of Systems Review of Systems  All other systems reviewed and are negative.    Physical Exam Updated Vital Signs BP (!) 183/85 (BP Location: Right Arm)   Pulse 72   Temp 98.8 F (37.1 C) (Oral)   Resp 20    Ht 6\' 1"  (1.854 m)   Wt 96.2 kg (212 lb)   SpO2 98%   BMI 27.97 kg/m   Physical Exam  Constitutional: He appears well-developed and well-nourished. No distress.  HENT:  Head: Normocephalic and atraumatic.  Mouth/Throat: Oropharynx is clear and moist. No oropharyngeal exudate.  Eyes: Conjunctivae and EOM are normal. Pupils are equal, round, and reactive to light. Right eye exhibits no discharge. Left eye exhibits no discharge. No scleral icterus.  Neck: Normal range  of motion. Neck supple. No JVD present. No thyromegaly present.  Cardiovascular: Normal rate, regular rhythm, normal heart sounds and intact distal pulses. Exam reveals no gallop and no friction rub.  No murmur heard. Fistula present in the left distal forearm, good thrill, no redness or tenderness  Pulmonary/Chest: Effort normal and breath sounds normal. No respiratory distress. He has no wheezes. He has no rales.  Abdominal: Soft. Bowel sounds are normal. He exhibits no distension and no mass. There is tenderness ( Reproducible tenderness in the mid abdomen around the periumbilical area as well as the epigastrium, no pain in the suprapubic or right lower quadrant.  No guarding, soft abdomen).  Musculoskeletal: Normal range of motion. He exhibits no edema or tenderness.  Lymphadenopathy:    He has no cervical adenopathy.  Neurological: He is alert. Coordination normal.  Skin: Skin is warm and dry. No rash noted. No erythema.  Psychiatric: He has a normal mood and affect. His behavior is normal.  Nursing note and vitals reviewed.    ED Treatments / Results  Labs (all labs ordered are listed, but only abnormal results are displayed) Labs Reviewed  CBC WITH DIFFERENTIAL/PLATELET - Abnormal; Notable for the following components:      Result Value   WBC 11.1 (*)    Hemoglobin 12.0 (*)    HCT 37.0 (*)    Neutro Abs 8.6 (*)    All other components within normal limits  COMPREHENSIVE METABOLIC PANEL - Abnormal; Notable for  the following components:   Glucose, Bld 262 (*)    Creatinine, Ser 1.50 (*)    ALT 13 (*)    GFR calc non Af Amer 46 (*)    GFR calc Af Amer 54 (*)    All other components within normal limits  URINALYSIS, ROUTINE W REFLEX MICROSCOPIC - Abnormal; Notable for the following components:   Glucose, UA >=500 (*)    Protein, ur 100 (*)    Squamous Epithelial / LPF 0-5 (*)    All other components within normal limits  LIPASE, BLOOD    EKG  EKG Interpretation None       Radiology Ct Abdomen Pelvis W Contrast  Result Date: 07/18/2017 CLINICAL DATA:  Lower abdominal pain with nausea and vomiting EXAM: CT ABDOMEN AND PELVIS WITH CONTRAST TECHNIQUE: Multidetector CT imaging of the abdomen and pelvis was performed using the standard protocol following bolus administration of intravenous contrast. CONTRAST:  139mL ISOVUE-300 IOPAMIDOL (ISOVUE-300) INJECTION 61% COMPARISON:  10/15/2016 FINDINGS: Lower chest: Lung bases demonstrate some mild atelectatic changes. No focal infiltrate is seen. Hepatobiliary: Liver demonstrates evidence of mild intrahepatic biliary ductal dilatation as well as prominence of the common bile duct with normal tapering distally. The gallbladder is partially distended. Some mild irregularity inflammatory changes noted surrounding the fundus of the gallbladder. This may be in part due to scarring from prior cholecystostomy tube. Correlation with laboratory values is recommended. Pancreas: Within normal limits. Spleen: Normal in size without focal abnormality. Adrenals/Urinary Tract: Renal cystic change is again identified. No calculi are noted. The adrenal glands are within normal limits. The bladder is partially distended. Stomach/Bowel: Scattered diverticular change of the colon is noted without evidence of diverticulitis. The appendix is within normal limits. No obstructive or inflammatory bowel changes are noted. Vascular/Lymphatic: No significant vascular findings are present.  No enlarged abdominal or pelvic lymph nodes. Reproductive: Prostate is unremarkable. Other: Large fat containing left inguinal hernia is noted. No incarcerated bowel is noted. No free fluid is seen. Musculoskeletal:  Mild degenerative changes of the lumbar spine are seen. IMPRESSION: Mild irregularity surrounding the gallbladder which may be in part due to prior cholecystostomy. The need for further evaluation can be determined on a clinical basis. Correlation laboratory values is recommended Biliary ductal dilatation is noted although normal tapering is seen distally. No obstructing calculus is noted. Correlation with laboratory values is recommended Diverticulosis without diverticulitis. Fat containing inguinal hernia on the left. Electronically Signed   By: Inez Catalina M.D.   On: 07/18/2017 20:02    Procedures Procedures (including critical care time)  Medications Ordered in ED Medications  dicyclomine (BENTYL) capsule 20 mg (not administered)  morphine 4 MG/ML injection 2 mg (2 mg Intravenous Given 07/18/17 1842)  iopamidol (ISOVUE-300) 61 % injection 100 mL (100 mLs Intravenous Contrast Given 07/18/17 1913)     Initial Impression / Assessment and Plan / ED Course  I have reviewed the triage vital signs and the nursing notes.  Pertinent labs & imaging results that were available during my care of the patient were reviewed by me and considered in my medical decision making (see chart for details).    Rule out gallbladder disease, pancreatitis or other abdominal problems, 12 hours of ongoing abdominal pain suggest the need for further evaluation with advanced imaging.  Labs reassuring CT neg for acute findings - pt has no RUQ ttp  Pt given all results including proteinuria - Stable for d/c Try bentyl Pt agreeable.  Final Clinical Impressions(s) / ED Diagnoses   Final diagnoses:  Periumbilical abdominal pain  Proteinuria, unspecified type    ED Discharge Orders        Ordered     dicyclomine (BENTYL) 20 MG tablet  2 times daily     07/18/17 2023       Noemi Chapel, MD 07/18/17 2026

## 2017-07-18 NOTE — ED Triage Notes (Signed)
Pt reports woke up with abd pain and has had n/v/d today.

## 2017-07-18 NOTE — Discharge Instructions (Signed)
Please return to the ER for worsening symptoms.

## 2017-07-21 DIAGNOSIS — R1011 Right upper quadrant pain: Secondary | ICD-10-CM | POA: Diagnosis not present

## 2017-07-21 DIAGNOSIS — R509 Fever, unspecified: Secondary | ICD-10-CM | POA: Diagnosis not present

## 2017-07-21 DIAGNOSIS — Z6836 Body mass index (BMI) 36.0-36.9, adult: Secondary | ICD-10-CM | POA: Diagnosis not present

## 2017-07-30 MED FILL — TRULICITY 1.5 MG/0.5 ML PEN: 1.5 | 28 days supply | Qty: 2 | Fill #1

## 2017-08-05 MED FILL — NOVOLOG FLEXPEN SYRINGE: 100 | 35 days supply | Qty: 15 | Fill #3

## 2017-08-05 MED FILL — LANTUS SOLOSTAR 100 UNITS/M: 100 | 27 days supply | Qty: 15 | Fill #5

## 2017-08-08 ENCOUNTER — Ambulatory Visit: Payer: Self-pay | Admitting: Surgery

## 2017-08-08 DIAGNOSIS — Z8719 Personal history of other diseases of the digestive system: Secondary | ICD-10-CM | POA: Diagnosis not present

## 2017-08-08 NOTE — H&P (Signed)
History of Present Illness Gary Lang. Gary Trebilcock Lang; 08/08/2017 10:02 PM)  The patient is a 66 year old male who presents for evaluation of gall stones. The patient was seen as a consultation in the hospital on 09/07/16. He had been hospitalized at [facility] for pneumonia. He developed acalculous cholecystitis. He was then transferred to [facility] for further management. The patient has significant medical comorbidities. We recommended percutaneous cholecystostomy. This was placed on 09/08/16. He was finally discharged on 09/29/16. His discharge diagnoses included hypoxia, elevated troponin, acute kidney injury, sepsis, right lower lobe pneumonia, as well as acalculous cholecystitis. The patient underwent repeat CT scan as well as a cholangiogram via his percutaneous tube on 10/15/16. This showed that the cystic duct is patent. The CT scan showed no sign of calcified gallstones. The perihepatic fluid collections were nearly resolved. The cholecystostomy was removed.    A few days after the tube was removed, the patient had a single episode of nausea and vomiting as well as diarrhea. He reports no abdominal pain with this episode. This lasted for about an hour. He has not had any further episodes since that time. His family asked that he come in for another evaluation because they're concerned that his gallbladder is still having problems. Bowel movements have returned to normal.    He was last seen about 9 months ago. His overall medical status has improved. He recently presented to the ED at [facility] complaining of pain in the epigastrium associated with nausea, vomiting, and bloating. He has had several episodes that are usually related to eating. CT scan and labs were obtained.      EXAM:  CT ABDOMEN AND PELVIS WITH CONTRAST    TECHNIQUE:  Multidetector CT imaging of the abdomen and pelvis was performed  using the standard protocol following bolus administration  of  intravenous contrast.    CONTRAST: 137mL ISOVUE-300 IOPAMIDOL (ISOVUE-300) INJECTION 61%    COMPARISON: 10/15/2016    FINDINGS:  Lower chest: Lung bases demonstrate some mild atelectatic changes.  No focal infiltrate is seen.    Hepatobiliary: Liver demonstrates evidence of mild intrahepatic  biliary ductal dilatation as well as prominence of the common bile  duct with normal tapering distally. The gallbladder is partially  distended. Some mild irregularity inflammatory changes noted  surrounding the fundus of the gallbladder. This may be in part due  to scarring from prior cholecystostomy tube. Correlation with  laboratory values is recommended.    Pancreas: Within normal limits.    Spleen: Normal in size without focal abnormality.    Adrenals/Urinary Tract: Renal cystic change is again identified. No  calculi are noted. The adrenal glands are within normal limits. The  bladder is partially distended.    Stomach/Bowel: Scattered diverticular change of the colon is noted  without evidence of diverticulitis. The appendix is within normal  limits. No obstructive or inflammatory bowel changes are noted.    Vascular/Lymphatic: No significant vascular findings are present. No  enlarged abdominal or pelvic lymph nodes.    Reproductive: Prostate is unremarkable.    Other: Large fat containing left inguinal hernia is noted. No  incarcerated bowel is noted. No free fluid is seen.    Musculoskeletal: Mild degenerative changes of the lumbar spine are  seen.    IMPRESSION:  Mild irregularity surrounding the gallbladder which may be in part  due to prior cholecystostomy. The need for further evaluation can be  determined on a clinical basis. Correlation laboratory values is  recommended  Biliary ductal dilatation is noted although normal tapering is seen  distally. No obstructing calculus is noted. Correlation with  laboratory values is recommended    Diverticulosis  without diverticulitis.    Fat containing inguinal hernia on the left.      Electronically Signed  By: Gary Lang M.D.  On: 07/18/2017 20:02      CBC on 11/16 - WBC 11.1, Hgb 12.0  Cr - 1.5  LFT's WNL  Lipase - 32        Physical Exam Gary Lang; 08/08/2017 10:03 PM)    The physical exam findings are as follows:  Note:WDWN in NAD - he seems much stronger and energetic compared to last visit  Eyes: Pupils equal, round; sclera anicteric  HENT: Oral mucosa moist; good dentition  Neck: No masses palpated, no thyromegaly  Left forearm AV fistula  Lungs: CTA bilaterally; normal respiratory effort  CV: Regular rate and rhythm; no murmurs; extremities well-perfused with no edema  Abd: +bowel sounds, soft, mildly tender in epigastrium and RUQ; no palpable masses; no rebound or guarding  Skin: Warm, dry; no sign of jaundice  Psychiatric - alert and oriented x 4; calm mood and affect        Assessment & Plan Gary Lang; 08/08/2017 10:04 PM)    HISTORY OF ACUTE CHOLECYSTITIS (Z87.19)    Current Plans  Schedule for Surgery - Laparoscopic cholecystectomy with intraoperative cholangiogram. The surgical procedure has been discussed with the patient. Potential risks, benefits, alternative treatments, and expected outcomes have been explained. All of the patient's questions at this time have been answered. The likelihood of reaching the patient's treatment goal is good. The patient understand the proposed surgical procedure and wishes to proceed.  Note:His constellation of symptoms and the scarring noted on the CT scan are consistent with gallbladder disease. His overall medical status seems to be improved. We will obtain cardiac clearance and then plan laparoscopic cholecystectomy with intraoperative cholangiogram.  Gary Lang. Georgette Dover, Lang, [facility]  General/ Trauma Surgery  08/08/2017 10:05 PM

## 2017-08-18 ENCOUNTER — Telehealth: Payer: Self-pay | Admitting: *Deleted

## 2017-08-18 NOTE — Telephone Encounter (Signed)
   [facility] Pre-operative Risk Assessment    Request for surgical clearance:  1. What type of surgery is being performed? LAPAROSCOPIC CHOLECYSTECTOMY WITH INTRAOPERATIVE CHOLANGLAGRAM   2. When is this surgery scheduled? PENDING CLEARANCE   3. Are there any medications that need to be held prior to surgery and how long?NONE  4. Practice name and name of physician performing surgery? UNKNOWN   5. What is your office phone and fax number? PH=[phone]  FAX=[phone]   6. Anesthesia type (None, local, MAC, general) ? GENERAL   Gary Lang 08/18/2017, 5:15 PM  _________________________________________________________________   (provider comments below)

## 2017-08-19 NOTE — Telephone Encounter (Signed)
Spoke with patient regarding preop appointment.  Patient aggred to 09/29/2017 at 10:00am with Lauretta Chester, PA.  Patient had no further questions.

## 2017-08-19 NOTE — Telephone Encounter (Signed)
   Primary Cardiologist:James Hochrein, MD  Chart reviewed as part of pre-operative protocol coverage. Because of Ballard L Caswell Sr.'s past medical history and time since last visit, he/she will require a follow-up visit in order to better assess preoperative cardiovascular risk.  We will schedule follow-up appt and contact the patient.  Murray Hodgkins, NP  08/19/2017, 4:19 PM

## 2017-08-29 MED FILL — FREESTYLE LITE TEST STRIP: 25 days supply | Qty: 100 | Fill #3

## 2017-09-01 DIAGNOSIS — E119 Type 2 diabetes mellitus without complications: Secondary | ICD-10-CM | POA: Insufficient documentation

## 2017-09-01 DIAGNOSIS — E559 Vitamin D deficiency, unspecified: Secondary | ICD-10-CM | POA: Insufficient documentation

## 2017-09-01 DIAGNOSIS — N189 Chronic kidney disease, unspecified: Secondary | ICD-10-CM | POA: Insufficient documentation

## 2017-09-01 DIAGNOSIS — M069 Rheumatoid arthritis, unspecified: Secondary | ICD-10-CM | POA: Insufficient documentation

## 2017-09-09 DIAGNOSIS — J09X2 Influenza due to identified novel influenza A virus with other respiratory manifestations: Secondary | ICD-10-CM | POA: Diagnosis not present

## 2017-09-09 DIAGNOSIS — R112 Nausea with vomiting, unspecified: Secondary | ICD-10-CM | POA: Diagnosis not present

## 2017-09-09 DIAGNOSIS — Z6829 Body mass index (BMI) 29.0-29.9, adult: Secondary | ICD-10-CM | POA: Diagnosis not present

## 2017-09-15 DIAGNOSIS — N179 Acute kidney failure, unspecified: Secondary | ICD-10-CM | POA: Diagnosis not present

## 2017-09-15 DIAGNOSIS — Z6829 Body mass index (BMI) 29.0-29.9, adult: Secondary | ICD-10-CM | POA: Diagnosis not present

## 2017-09-15 DIAGNOSIS — E785 Hyperlipidemia, unspecified: Secondary | ICD-10-CM | POA: Diagnosis not present

## 2017-09-15 DIAGNOSIS — E1122 Type 2 diabetes mellitus with diabetic chronic kidney disease: Secondary | ICD-10-CM | POA: Diagnosis not present

## 2017-09-15 DIAGNOSIS — J09X2 Influenza due to identified novel influenza A virus with other respiratory manifestations: Secondary | ICD-10-CM | POA: Diagnosis not present

## 2017-09-15 DIAGNOSIS — R1011 Right upper quadrant pain: Secondary | ICD-10-CM | POA: Diagnosis not present

## 2017-09-15 DIAGNOSIS — M059 Rheumatoid arthritis with rheumatoid factor, unspecified: Secondary | ICD-10-CM | POA: Diagnosis not present

## 2017-09-15 DIAGNOSIS — Z7901 Long term (current) use of anticoagulants: Secondary | ICD-10-CM | POA: Diagnosis not present

## 2017-09-15 DIAGNOSIS — R739 Hyperglycemia, unspecified: Secondary | ICD-10-CM | POA: Diagnosis not present

## 2017-09-15 DIAGNOSIS — I10 Essential (primary) hypertension: Secondary | ICD-10-CM | POA: Diagnosis not present

## 2017-09-15 DIAGNOSIS — N182 Chronic kidney disease, stage 2 (mild): Secondary | ICD-10-CM | POA: Diagnosis not present

## 2017-09-15 DIAGNOSIS — R112 Nausea with vomiting, unspecified: Secondary | ICD-10-CM | POA: Diagnosis not present

## 2017-09-22 DIAGNOSIS — I10 Essential (primary) hypertension: Secondary | ICD-10-CM | POA: Diagnosis not present

## 2017-09-22 DIAGNOSIS — D631 Anemia in chronic kidney disease: Secondary | ICD-10-CM | POA: Diagnosis not present

## 2017-09-22 DIAGNOSIS — M059 Rheumatoid arthritis with rheumatoid factor, unspecified: Secondary | ICD-10-CM | POA: Diagnosis not present

## 2017-09-22 DIAGNOSIS — N183 Chronic kidney disease, stage 3 (moderate): Secondary | ICD-10-CM | POA: Diagnosis not present

## 2017-09-22 DIAGNOSIS — Z6829 Body mass index (BMI) 29.0-29.9, adult: Secondary | ICD-10-CM | POA: Diagnosis not present

## 2017-09-22 DIAGNOSIS — E1122 Type 2 diabetes mellitus with diabetic chronic kidney disease: Secondary | ICD-10-CM | POA: Diagnosis not present

## 2017-09-22 DIAGNOSIS — G9009 Other idiopathic peripheral autonomic neuropathy: Secondary | ICD-10-CM | POA: Diagnosis not present

## 2017-09-29 ENCOUNTER — Encounter (INDEPENDENT_AMBULATORY_CARE_PROVIDER_SITE_OTHER): Payer: Self-pay

## 2017-09-29 ENCOUNTER — Encounter: Payer: Self-pay | Admitting: Physician Assistant

## 2017-09-29 ENCOUNTER — Ambulatory Visit: Payer: PPO | Admitting: Physician Assistant

## 2017-09-29 VITALS — BP 148/72 | HR 79 | Ht 73.0 in | Wt 234.0 lb

## 2017-09-29 DIAGNOSIS — N183 Chronic kidney disease, stage 3 unspecified: Secondary | ICD-10-CM

## 2017-09-29 DIAGNOSIS — Z01810 Encounter for preprocedural cardiovascular examination: Secondary | ICD-10-CM

## 2017-09-29 DIAGNOSIS — I10 Essential (primary) hypertension: Secondary | ICD-10-CM | POA: Diagnosis not present

## 2017-09-29 DIAGNOSIS — I480 Paroxysmal atrial fibrillation: Secondary | ICD-10-CM

## 2017-09-29 MED ORDER — DILTIAZEM HCL ER COATED BEADS 240 MG PO CP24: 240.0000 mg | ORAL_CAPSULE | Freq: Every day | ORAL | 5 refills | Status: DC

## 2017-09-29 NOTE — Progress Notes (Signed)
Cardiology Office Note   Date:  09/29/2017   ID:  Gary REWERTS Sr., DOB 10/11/1950, MRN [id number]  PCP:  Celene Squibb, MD  Cardiologist: Dr. Percival Spanish 09/13/2016 in hospital  Chief Complaint  Patient presents with  . Preoperative clearance    Cholecystectomy--pt states no Sx.    History of Present Illness: Gary L Chapa Sr. is a 67 y.o. male with a history of  CKD, DM, HTN, RA, A. fib diagnosed 09/05/2016 with spontaneous conversion to sinus rhythm, CHA2DS2 VASc=3 HTN, DM and age 67-74) previously on warfarin, cholecystitis status post cholecystostomy tube (later removed).  09/2016 admission he had pneumonia and right pleural effusion in addition to cholecystitis.  08/19/2017 phone note regarding preoperative evaluation for laparoscopic cholecystectomy with intraoperative cholangiogram>> appointment needed  Gary L Kluender Sr. presents today for preoperative clearance.  He denies any chest pain at rest. He reports some chest tightness with exertion about 1 year ago after he had pneumonia and was trying to rebuild his stamina; however, he has not had any chest tightness/discomfort in a "long while".   He denies any shortness of breath at rest or with exertion. He also denies any orthopnea, PND, or lower extremity edema.   He PCP recently increased his Lantus and Novolog last week, and he has noticed some palpitations since then. This morning he tried taking a lower dose of is Lantus and he has not noticed any palpitations today.   He denies any dizziness, lightheadedness, or presyncope/syncope.   He does not climb any stairs but states he walks his dogs around the block, which is about 2 miles, every other day. He can do this without any chest pain or shortness of breath.   His blood pressure is elevated today at 148/72. He states he took his Diltiazem about 1 hour before his appointment today. He does not regularly check his BP at home.   He checks his blood sugar 3 times per  day. He states it is normally around 180. However, it can range anywhere from 88 (one time last week) to 288. He does take prednisone PRN for his RA, which sometimes makes his blood sugar hard to control.   Past Medical History:  Diagnosis Date  . Chronic kidney disease   . Diabetes mellitus without complication ([place])   . Hypertension   . Rheumatoid arthritis ([place])   . Vitamin D deficiency    Hx: of    Past Surgical History:  Procedure Laterality Date  . AV FISTULA PLACEMENT Left 06/28/2013   Procedure: ARTERIOVENOUS (AV) FISTULA CREATION- Left arm with ultrasound guidance;  Surgeon: Rosetta Posner, MD;  Location: [facility] OR;  Service: Vascular;  Laterality: Left;  . COLONOSCOPY     hx: of  . HERNIA REPAIR    . INSERTION OF DIALYSIS CATHETER N/A 06/28/2013   Procedure: INSERTION OF DIALYSIS CATHETER;  Surgeon: Rosetta Posner, MD;  Location: [place];  Service: Vascular;  Laterality: N/A;  . IR GENERIC HISTORICAL  09/08/2016   IR PERC CHOLECYSTOSTOMY 09/08/2016 [name], MD [facility]  . IR GENERIC HISTORICAL  10/15/2016   IR RADIOLOGIST EVAL & MGMT 10/15/2016 Ardis Rowan, PA-C [facility] INTER[facility] RAD  . IR GENERIC HISTORICAL  10/29/2016   IR RADIOLOGIST EVAL & MGMT 10/29/2016 Jacqulynn Cadet, MD [facility] INTER[facility] RAD  . LIGATION OF COMPETING BRANCHES OF ARTERIOVENOUS FISTULA Left 10/12/2013   Procedure: LIGATION OF COMPETING BRANCHES OF ARTERIOVENOUS FISTULA;  Surgeon: Elam Dutch, MD;  Location: [facility]  OR;  Service: Vascular;  Laterality: Left;  . neck surgery      Current Outpatient Medications  Medication Sig Dispense Refill  . acetaminophen (TYLENOL) 500 MG tablet Take 2 tablets (1,000 mg total) by mouth 3 (three) times daily as needed for pain.    . cholecalciferol (VITAMIN D) 1000 units tablet Take 1,000 Units by mouth daily.    Marland Kitchen diltiazem (CARDIZEM CD) 240 MG 24 hr capsule Take 1 capsule (240 mg total) by mouth daily. 30 capsule 5  . ferrous sulfate (FERROUSUL) 325 (65 FE) MG tablet  Take 1 tablet (325 mg total) by mouth daily with breakfast. 30 tablet 3  . furosemide (LASIX) 40 MG tablet Take 1 tablet (40 mg total) by mouth 2 (two) times daily. (Patient taking differently: Take 40 mg daily by mouth. ) 30 tablet 0  . insulin aspart (NOVOLOG FLEXPEN) 100 UNIT/ML FlexPen Inject 7.5 Units into the skin 3 (three) times daily with meals. (Patient taking differently: Inject 8 Units 3 (three) times daily with meals into the skin. ) 5 pen 6  . Insulin Glargine (LANTUS) 100 UNIT/ML Solostar Pen Inject 35 Units into the skin every morning. Increase 5 u if am CBG is greater than 200.  Decrease 5 u if am CBG is less than 150 (Patient taking differently: Inject 30 Units every morning into the skin. ) 15 mL   . Insulin Pen Needle (PEN NEEDLES) 31G X 6 MM MISC Use 1 needle per injection as directed 100 each 1  . Multiple Vitamins-Minerals (CENTRUM SILVER 50+MEN) TABS Take 1 tablet by mouth daily.    . predniSONE (DELTASONE) 10 MG tablet Take 10 mg every other day by mouth.     . sodium bicarbonate 650 MG tablet Take 1,300 mg by mouth 3 (three) times daily.    . TRULICITY 1.5 HY/0.7PX SOPN Inject 0.5 mLs once a week as directed.     Marland Kitchen UNIFINE PENTIPS 31G X 5 MM MISC USE AS DIRECTED WITH INSULIN 100 each PRN   No current facility-administered medications for this visit.     Allergies:   Glyburide and Methotrexate derivatives    Social History:  The patient  reports that he quit smoking about 41 years ago. His smoking use included cigarettes. He has a 5.00 pack-year smoking history. he has never used smokeless tobacco. He reports that he does not drink alcohol or use drugs.   Family History:  The patient's family history includes Arthritis/Rheumatoid in his sister; Cancer in his mother.    ROS:  Please see the history of present illness. All other systems are reviewed and negative.    PHYSICAL EXAM: VS:  BP (!) 148/72 (BP Location: Right Arm, Patient Position: Sitting, Cuff Size: Large)    Pulse 79   Ht 6\' 1"  (1.854 m)   Wt 234 lb (106.1 kg)   BMI 30.87 kg/m  , BMI Body mass index is 30.87 kg/m. GEN: Well nourished, well developed, male in no acute distress  HEENT: normal for age  Neck: no JVD, no carotid bruit, no masses Cardiac: RRR; soft murmur, no rubs, or gallops Respiratory:  clear to auscultation bilaterally, normal work of breathing GI: soft, nontender, nondistended, + BS MS: no deformity or atrophy; no edema; distal pulses are 2+ in all 4 extremities   Skin: warm and dry, no rash Neuro:  Strength and sensation are intact Psych: euthymic mood, full affect   EKG:  EKG is ordered today. The ekg ordered today demonstrates normal sinus  rhythm with rate of 79, no acute ST changes  ECHO 09/06/2016; Study Conclusions: - Left ventricle: The cavity size was normal. Wall thickness was increased in a pattern of moderate LVH. Systolic function was vigorous. The estimated ejection fraction was in the range of 65% to 70%. Wall motion was normal; there were no regional wall motion abnormalities. Doppler parameters are consistent with abnormal left ventricular relaxation (grade 1 diastolicdysfunction). - Aortic valve: Valve area (VTI): 2.67 cm^2. Valve area (Vmax): 2.21 cm^2. Valve area (Vmean): 2.53 cm^2. - Atrial septum: No defect or patent foramen ovale was identified. - Pulmonary arteries: Systolic pressure was moderately increased. PA peak pressure: 40 mm Hg (S). - Technically adequate study.   Recent Labs: 07/18/2017: ALT 13; BUN 20; Creatinine, Ser 1.50; Hemoglobin 12.0; Platelets 237; Potassium 3.9; Sodium 135    Lipid Panel    Component Value Date/Time   CHOL 325 (H) 06/19/2013 0538   TRIG 145 06/19/2013 0538   HDL 46 06/19/2013 0538   CHOLHDL 7.1 06/19/2013 0538   VLDL 29 06/19/2013 0538   LDLCALC 250 (H) 06/19/2013 0538     Wt Readings from Last 3 Encounters:  09/29/17 234 lb (106.1 kg)  07/18/17 212 lb (96.2 kg)  10/03/16 216  lb 9.6 oz (98.2 kg)     ASSESSMENT AND PLAN:  1. Preoperative Clearance - Patient is here for preoperative clearance for a laparoscopic cholecystectomy.  - Patient denies chest pain or shortness of breath at rest or with exertion. - Patient does not climb stairs but he is able to walk 1-2 miles at a time without any chest pain or shortness of breath.  - Echo 09/06/2016 showed moderate LVH with EF of 65-70%, no regional wall motion abnormalities, grade 1 diastolic dysfunction. - RCRI 0.9% - Patient is at acceptable risk for surgery from a cardiac standpoint.  2. PAF - Patient is in NSR today. - Patient is unaware when he is in atrial fibrillation. He has reported palpitations since his insulin dose was increased last week. He tried decreasing insulin dose this morning and has not noticed any palpitations today. - Patient to check BP and HR daily and will contact office if HR is irregular. If HR is irregular or patient is noticing palpitations, will need to order Holter monitor or Event monitor. - Dr Nevada Crane took him off Coumadin. No bleeding issues at the time, unclear reason.   - Discussed starting Eliquis or Xarelto with pt, he wishes to get Dr. Rosezella Florida input on this.  3. HTN - BP 148/72 today. - Increase Diltiazem to 240mg  daily. - Patient to check BP daily.  - Recommended monitoring daily weights and adhering <2g of Na daily.  4. CKD III - Pt had ARF 2014 2nd nephrotic syndrome, requiring short-term HD. - AKI during 09/2016 hospitalization for sepsis w/ BUN/Cr peak 108/8.03, required CRRT briefly, renal function improved - BUN/CR 20/1.50 in November /2018, for recheck per PCP - pt currently on Lasix 40 mg qd, discussed limiting Na in diet   Current medicines are reviewed at length with the patient today.  The patient does not have concerns regarding medicines.  The following changes have been made:  - Increase Diltiazem from 180mg  to 240mg  daily.  Labs/ tests ordered today  include:   Orders Placed This Encounter  Procedures  . EKG 12-Lead     Disposition:   FU with Dr. Percival Spanish  Signed, Darreld Mclean, Student-PA Rosaria Ferries, PA-C   09/29/2017 11:25 AM    [facility] Phone: [phone]; Fax: [phone]  This note was written with the assistance of speech recognition software. Please excuse any transcriptional errors.

## 2017-09-29 NOTE — Patient Instructions (Addendum)
Medication Instructions:  INCREASE Cartia to 240 mg Once a day  Labwork: None   Testing/Procedures: None   Follow-Up: Your physician recommends that you schedule a follow-up appointment FIRST AVAILABLE with Dr Percival Spanish.  Any Other Special Instructions Will Be Listed Below (If Applicable). 1. CHECK Weight Daily 2. CHECK Blood pressure daily and try to check blood pressure when you are having  Palpitations 3. CONTACT office if you continue to have heart palpitations 4. WATCH your Sodium no more than 2000 mg per day or 500 mg per meal 5. CLEARED for surgery 6. Suanne Marker will speak with Dr Percival Spanish about possibly starting you on a blood thinner If you need a refill on your cardiac medications before your next appointment, please call your pharmacy.

## 2017-10-03 DIAGNOSIS — H6122 Impacted cerumen, left ear: Secondary | ICD-10-CM | POA: Diagnosis not present

## 2017-10-03 DIAGNOSIS — R040 Epistaxis: Secondary | ICD-10-CM | POA: Diagnosis not present

## 2017-10-14 DIAGNOSIS — H6122 Impacted cerumen, left ear: Secondary | ICD-10-CM | POA: Diagnosis not present

## 2017-10-14 DIAGNOSIS — H9312 Tinnitus, left ear: Secondary | ICD-10-CM | POA: Diagnosis not present

## 2017-10-21 NOTE — Pre-Procedure Instructions (Signed)
Gary L Shuler Sr.  10/21/2017      [place], [place] [address] Phone: [phone] Fax: [phone]    Your procedure is scheduled on October 28, 2017.  Report to [facility] Admitting at [place] AM  Call this number if you have problems the morning of surgery:  [phone]   Remember:  Do not eat food or drink liquids after midnight.  Take these medicines the morning of surgery with A SIP OF WATER acetaminophen (tylenol)-if needed, diltiazem (cardizem), prednisone (deltasone) -if needed.  7 days prior to surgery STOP taking any Aspirin (unless otherwise instructed by your surgeon), Aleve, Naproxen, Ibuprofen, Motrin, Advil, Goody's, BC's, all herbal medications, fish oil, and all vitamins  Continue all other medications as instructed by your physician except follow the above medication instructions before surgery  WHAT DO I DO ABOUT MY DIABETES MEDICATION?  Marland Kitchen Do not take oral diabetes medicines (pills) the morning of surgery.  . THE NIGHT BEFORE SURGERY, take your normal dose of regular insulin with dinner (16 units)      . THE MORNING OF SURGERY, take 25 units of lantus insulin (or half your normal dose depending on CBG that morning and no regular insulin unlesyour CBG is greater than 220 mg/dL, you may take  of your sliding scale (correction) dose of insulin.s .  Marland Kitchen The day of surgery, do not take other diabetes injectables, including Byetta (exenatide), Bydureon (exenatide ER), Victoza (liraglutide), or Trulicity (dulaglutide).  . If your CBG is greater than 220 mg/dL, you may take  of your sliding scale (correction) dose of insulin.  Reviewed and Endorsed by [facility] Patient Education Committee, August 2015  How to Manage Your Diabetes Before and After Surgery  Why is it important to control my blood sugar before and after surgery? . Improving blood sugar levels before and after surgery helps  healing and can limit problems. . A way of improving blood sugar control is eating a healthy diet by: o  Eating less sugar and carbohydrates o  Increasing activity/exercise o  Talking with your doctor about reaching your blood sugar goals . High blood sugars (greater than 180 mg/dL) can raise your risk of infections and slow your recovery, so you will need to focus on controlling your diabetes during the weeks before surgery. . Make sure that the doctor who takes care of your diabetes knows about your planned surgery including the date and location.  How do I manage my blood sugar before surgery? . Check your blood sugar at least 4 times a day, starting 2 days before surgery, to make sure that the level is not too high or low. o Check your blood sugar the morning of your surgery when you wake up and every 2 hours until you get to the Short Stay unit. . If your blood sugar is less than 70 mg/dL, you will need to treat for low blood sugar: o Do not take insulin. o Treat a low blood sugar (less than 70 mg/dL) with  cup of clear juice (cranberry or apple), 4 glucose tablets, OR glucose gel. Recheck blood sugar in 15 minutes after treatment (to make sure it is greater than 70 mg/dL). If your blood sugar is not greater than 70 mg/dL on recheck, call [phone] o  for further instructions. . Report your blood sugar to the short stay nurse when you get to Short Stay.  Marland Kitchen  If you are admitted to the hospital after surgery: o Your blood sugar will be checked by the staff and you will probably be given insulin after surgery (instead of oral diabetes medicines) to make sure you have good blood sugar levels. o The goal for blood sugar control after surgery is 80-180 mg/dL.    Do not wear jewelry.  Do not wear lotions, powders, or colognes, or deodorant.  Do not shave 48 hours prior to surgery.  Men may shave face and neck.  Do not bring valuables to the hospital.   [facility] is not responsible for  any belongings or valuables.  Contacts, dentures or bridgework may not be worn into surgery.  Leave your suitcase in the car.  After surgery it may be brought to your room.  For patients admitted to the hospital, discharge time will be determined by your treatment team.  Patients discharged the day of surgery will not be allowed to drive home.   Special instructions: [facility]- Preparing For Surgery  Before surgery, you can play an important role. Because skin is not sterile, your skin needs to be as free of germs as possible. You can reduce the number of germs on your skin by washing with CHG (chlorahexidine gluconate) Soap before surgery.  CHG is an antiseptic cleaner which kills germs and bonds with the skin to continue killing germs even after washing.  Please do not use if you have an allergy to CHG or antibacterial soaps. If your skin becomes reddened/irritated stop using the CHG.  Do not shave (including legs and underarms) for at least 48 hours prior to first CHG shower. It is OK to shave your face.  Please follow these instructions carefully.   1. Shower the NIGHT BEFORE SURGERY and the MORNING OF SURGERY with CHG.   2. If you chose to wash your hair, wash your hair first as usual with your normal shampoo.  3. After you shampoo, rinse your hair and body thoroughly to remove the shampoo.  4. Use CHG as you would any other liquid soap. You can apply CHG directly to the skin and wash gently with a scrungie or a clean washcloth.   5. Apply the CHG Soap to your body ONLY FROM THE NECK DOWN.  Do not use on open wounds or open sores. Avoid contact with your eyes, ears, mouth and genitals (private parts). Wash Face and genitals (private parts)  with your normal soap.  6. Wash thoroughly, paying special attention to the area where your surgery will be performed.  7. Thoroughly rinse your body with warm water from the neck down.  8. DO NOT shower/wash with your normal soap after  using and rinsing off the CHG Soap.  9. Pat yourself dry with a CLEAN TOWEL.  10. Wear CLEAN PAJAMAS to bed the night before surgery, wear comfortable clothes the morning of surgery  11. Place CLEAN SHEETS on your bed the night of your first shower and DO NOT SLEEP WITH PETS.  Day of Surgery: Do not apply any deodorants/lotions. Please wear clean clothes to the hospital/surgery center.    Please read over the following fact sheets that you were given. Pain Booklet, Coughing and Deep Breathing and Surgical Site Infection Prevention

## 2017-10-22 ENCOUNTER — Encounter (HOSPITAL_COMMUNITY): Payer: Self-pay

## 2017-10-22 ENCOUNTER — Encounter (HOSPITAL_COMMUNITY)
Admission: RE | Admit: 2017-10-22 | Discharge: 2017-10-22 | Disposition: A | Discharge status: Home / Self Care | Payer: PPO | Source: Ambulatory Visit | Attending: Surgery | Admitting: Surgery

## 2017-10-22 ENCOUNTER — Other Ambulatory Visit: Payer: Self-pay

## 2017-10-22 DIAGNOSIS — N289 Disorder of kidney and ureter, unspecified: Secondary | ICD-10-CM | POA: Diagnosis not present

## 2017-10-22 DIAGNOSIS — I10 Essential (primary) hypertension: Secondary | ICD-10-CM | POA: Insufficient documentation

## 2017-10-22 DIAGNOSIS — I4891 Unspecified atrial fibrillation: Secondary | ICD-10-CM | POA: Diagnosis not present

## 2017-10-22 DIAGNOSIS — Z87891 Personal history of nicotine dependence: Secondary | ICD-10-CM | POA: Insufficient documentation

## 2017-10-22 DIAGNOSIS — M069 Rheumatoid arthritis, unspecified: Secondary | ICD-10-CM | POA: Diagnosis not present

## 2017-10-22 DIAGNOSIS — Z01812 Encounter for preprocedural laboratory examination: Secondary | ICD-10-CM | POA: Diagnosis not present

## 2017-10-22 DIAGNOSIS — E1165 Type 2 diabetes mellitus with hyperglycemia: Secondary | ICD-10-CM | POA: Diagnosis not present

## 2017-10-22 HISTORY — DX: Chronic cholecystitis: K81.1

## 2017-10-22 LAB — BASIC METABOLIC PANEL
Anion gap: 10 (ref 5–15)
BUN: 19 mg/dL (ref 6–20)
CO2: 24 mmol/L (ref 22–32)
CREATININE: 1.63 mg/dL — AB (ref 0.61–1.24)
Calcium: 8.8 mg/dL — ABNORMAL LOW (ref 8.9–10.3)
Chloride: 108 mmol/L (ref 101–111)
GFR calc Af Amer: 49 mL/min — ABNORMAL LOW (ref >60)
GFR, EST NON AFRICAN AMERICAN: 42 mL/min — AB (ref >60)
GLUCOSE: 130 mg/dL — AB (ref 65–99)
POTASSIUM: 3.8 mmol/L (ref 3.5–5.1)
SODIUM: 142 mmol/L (ref 135–145)

## 2017-10-22 LAB — CBC
HEMATOCRIT: 38.4 % — AB (ref 39.0–52.0)
Hemoglobin: 12.8 g/dL — ABNORMAL LOW (ref 13.0–17.0)
MCH: 27.6 pg (ref 26.0–34.0)
MCHC: 33.3 g/dL (ref 30.0–36.0)
MCV: 82.9 fL (ref 78.0–100.0)
PLATELETS: 263 10*3/uL (ref 150–400)
RBC: 4.63 MIL/uL (ref 4.22–5.81)
RDW: 13.7 % (ref 11.5–15.5)
WBC: 11.1 10*3/uL — AB (ref 4.0–10.5)

## 2017-10-22 LAB — HEMOGLOBIN A1C
HEMOGLOBIN A1C: 10.2 % — AB (ref 4.8–5.6)
MEAN PLASMA GLUCOSE: 246.04 mg/dL

## 2017-10-22 LAB — GLUCOSE, CAPILLARY: GLUCOSE-CAPILLARY: 127 mg/dL — AB (ref 65–99)

## 2017-10-22 NOTE — Progress Notes (Signed)
PCP - Dr. Edwinna Areola. [place]  Cardiologist - Dr. Percival Spanish- Clearance note in (E) by PA Rhonda Barrett  Chest x-ray - Denies  EKG - 09/29/17  Stress Test - Denies  ECHO - 09/06/16 (E)  Cardiac Cath - Denies  Sleep Study - No CPAP - None  LABS- 10/22/17: CBC, BMP, HA1C  HA1C- 10/22/17 Fasting Blood Sugar - 127, Today 127 Checks Blood Sugar ___3__ times a day  Anesthesia- Yes- Cardiac clearance note in (E)  Pt denies having chest pain, sob, or fever at this time. All instructions explained to the pt, with a verbal understanding of the material. Pt agrees to go over the instructions while at home for a better understanding. The opportunity to ask questions was provided.

## 2017-10-23 NOTE — Progress Notes (Addendum)
Anesthesia Chart Review: Patient is a 67 year old male scheduled for laparoscopic cholecystectomy, possible open cholecystectomy on 10/28/2017 by Dr. Donnie Mesa.   History include former smoker (quit '78), HTN, afib (09/05/16, spontaneously converted to SR), RA, CKD (ARF 2014 due to nephrotic syndrome and required short term hemodialysis; admission 09/2016 AKI requiring brief CRRT in the setting of sepsis/cholecystitis/RLL CAP with right pleural effusion s/p thoracentesis), DM2, neck surgery, hernia repair.  - PCP is Dr. Valorie Roosevelt. - Nephrologist is Dr. Erling Cruz. Records pending.  - Cardiologist is Dr. Minus Breeding. Patient was seen by Rosaria Ferries, PA-C on 09/29/17 for preoperative clearance. Patient in NSR, although he admits to not knowing when he was in afib or not. He will monitor of any irregularities, and they would consider a Holter monitor if that occurred.  Dr. Nevada Crane had taken him off warfarin, and patient did not want to commit to starting NOAC until further input from Dr. Percival Spanish. In regards to surgery, she wrote: 1. Preoperative Clearance - Patient is here for preoperative clearance for a laparoscopic cholecystectomy.  - Patient denies chest pain or shortness of breath at rest or with exertion. - Patient does not climb stairs but he is able to walk 1-2 miles at a time without any chest pain or shortness of breath.  - Echo 09/06/2016 showed moderate LVH with EF of 65-70%, no regional wall motion abnormalities, grade 1 diastolic dysfunction. - RCRI 0.9% - Patient is at acceptable risk for surgery from a cardiac standpoint.  Meds include diltiazem, ferrous sulfate, Lasix, NovoLog, Lantus, prednisone, Trulicity, sodium bicarbonate.  BP (!) 150/60   Pulse 86   Temp 36.9 C   Resp 20   Ht 6' 1.5" (1.867 m)   Wt 237 lb 11.2 oz (107.8 kg)   SpO2 97%   BMI 30.94 kg/m   EKG 09/29/17: NSR, minimal voltage criteria for LVH, may be normal variant.   Echo 09/06/16: Study  Conclusions - Left ventricle: The cavity size was normal. Wall thickness was   increased in a pattern of moderate LVH. Systolic function was   vigorous. The estimated ejection fraction was in the range of 65%   to 70%. Wall motion was normal; there were no regional wall   motion abnormalities. Doppler parameters are consistent with   abnormal left ventricular relaxation (grade 1 diastolic   dysfunction). - Aortic valve: Valve area (VTI): 2.67 cm^2. Valve area (Vmax):   2.21 cm^2. Valve area (Vmean): 2.53 cm^2. - Atrial septum: No defect or patent foramen ovale was identified. - Pulmonary arteries: Systolic pressure was moderately increased.   PA peak pressure: 40 mm Hg (S). - Technically adequate study.  Preoperative labs noted. Glucose 130. BUN 19, Cr 1.63 (previously 1.50 07/18/17). WBC 11.1, H/H 12.8/38.4, PLT 263. A1c 10.2 consistent with average glucose of 246 (up from 9.4 on 09/16/16). He reported fasting CBG of 127 on 10/22/17.    Patient has been cleared by cardiology, but DM is poorly controlled place him a increased risk for cancellation if he arrives significantly hyperglycemic on the day of surgery. I have forwarded A1c result to Dr. Georgette Dover for review/input. Awaiting nephrology records.   George Hugh [facility] Short Stay Center/Anesthesiology Phone [phone] 10/23/2017 5:43 PM  Addendum: Dr. Georgette Dover has reviewed labs. For an anticipated out-patient procedure he is comfortable proceeding despite elevated A1c as long as the anesthesiologist feels fasting glucose on the day of surgery is acceptable. Nephrology records are still pending, but overall his renal  function appears stable when compared to 07/2017 labs. (Update 10/27/17 11:02 AM: 10/30/16 office note received from Dr. Florene Glen. Cr then was 1.72.)  George Hugh [facility] Short Stay Center/Anesthesiology Phone [phone] 10/24/2017 10:55 AM

## 2017-10-28 ENCOUNTER — Encounter (HOSPITAL_COMMUNITY): Payer: Self-pay | Admitting: Urology

## 2017-10-28 ENCOUNTER — Encounter (HOSPITAL_COMMUNITY)
Admission: RE | Disposition: A | Discharge status: Home / Self Care | Payer: Self-pay | Source: Ambulatory Visit | Attending: Surgery

## 2017-10-28 ENCOUNTER — Ambulatory Visit (HOSPITAL_COMMUNITY): Payer: PPO

## 2017-10-28 ENCOUNTER — Ambulatory Visit (HOSPITAL_COMMUNITY)
Admission: RE | Admit: 2017-10-28 | Discharge: 2017-10-29 | Disposition: A | Discharge status: Home / Self Care | Payer: PPO | Source: Ambulatory Visit | Attending: Surgery | Admitting: Surgery

## 2017-10-28 ENCOUNTER — Ambulatory Visit (HOSPITAL_COMMUNITY): Payer: PPO | Admitting: Vascular Surgery

## 2017-10-28 ENCOUNTER — Ambulatory Visit (HOSPITAL_COMMUNITY): Payer: PPO | Admitting: Anesthesiology

## 2017-10-28 DIAGNOSIS — N179 Acute kidney failure, unspecified: Secondary | ICD-10-CM | POA: Diagnosis not present

## 2017-10-28 DIAGNOSIS — K838 Other specified diseases of biliary tract: Secondary | ICD-10-CM | POA: Diagnosis not present

## 2017-10-28 DIAGNOSIS — Z87891 Personal history of nicotine dependence: Secondary | ICD-10-CM | POA: Diagnosis not present

## 2017-10-28 DIAGNOSIS — Z419 Encounter for procedure for purposes other than remedying health state, unspecified: Secondary | ICD-10-CM

## 2017-10-28 DIAGNOSIS — K4090 Unilateral inguinal hernia, without obstruction or gangrene, not specified as recurrent: Secondary | ICD-10-CM | POA: Diagnosis not present

## 2017-10-28 DIAGNOSIS — I2510 Atherosclerotic heart disease of native coronary artery without angina pectoris: Secondary | ICD-10-CM | POA: Insufficient documentation

## 2017-10-28 DIAGNOSIS — I120 Hypertensive chronic kidney disease with stage 5 chronic kidney disease or end stage renal disease: Secondary | ICD-10-CM | POA: Diagnosis not present

## 2017-10-28 DIAGNOSIS — E1122 Type 2 diabetes mellitus with diabetic chronic kidney disease: Secondary | ICD-10-CM | POA: Diagnosis not present

## 2017-10-28 DIAGNOSIS — N186 End stage renal disease: Secondary | ICD-10-CM | POA: Diagnosis not present

## 2017-10-28 DIAGNOSIS — K8010 Calculus of gallbladder with chronic cholecystitis without obstruction: Secondary | ICD-10-CM | POA: Diagnosis present

## 2017-10-28 DIAGNOSIS — Z79899 Other long term (current) drug therapy: Secondary | ICD-10-CM | POA: Diagnosis not present

## 2017-10-28 HISTORY — PX: CHOLECYSTECTOMY: SHX55

## 2017-10-28 LAB — CREATININE, SERUM
Creatinine, Ser: 1.64 mg/dL — ABNORMAL HIGH (ref 0.61–1.24)
GFR calc Af Amer: 48 mL/min — ABNORMAL LOW
GFR calc non Af Amer: 42 mL/min — ABNORMAL LOW

## 2017-10-28 LAB — GLUCOSE, CAPILLARY
GLUCOSE-CAPILLARY: 142 mg/dL — AB (ref 65–99)
GLUCOSE-CAPILLARY: 172 mg/dL — AB (ref 65–99)
Glucose-Capillary: 122 mg/dL — ABNORMAL HIGH (ref 65–99)
Glucose-Capillary: 201 mg/dL — ABNORMAL HIGH (ref 65–99)
Glucose-Capillary: 205 mg/dL — ABNORMAL HIGH (ref 65–99)

## 2017-10-28 LAB — CBC
HCT: 38.4 % — ABNORMAL LOW (ref 39.0–52.0)
Hemoglobin: 12.6 g/dL — ABNORMAL LOW (ref 13.0–17.0)
MCH: 27.9 pg (ref 26.0–34.0)
MCHC: 32.8 g/dL (ref 30.0–36.0)
MCV: 85 fL (ref 78.0–100.0)
Platelets: 231 K/uL (ref 150–400)
RBC: 4.52 MIL/uL (ref 4.22–5.81)
RDW: 14.5 % (ref 11.5–15.5)
WBC: 14.2 K/uL — ABNORMAL HIGH (ref 4.0–10.5)

## 2017-10-28 SURGERY — LAPAROSCOPIC CHOLECYSTECTOMY WITH INTRAOPERATIVE CHOLANGIOGRAM
Anesthesia: General | Site: Abdomen

## 2017-10-28 MED ORDER — SODIUM CHLORIDE 0.9 % IV SOLN
INTRAVENOUS | Status: DC | PRN
  Administered 2017-10-28: 12 mL

## 2017-10-28 MED ORDER — FENTANYL CITRATE (PF) 250 MCG/5ML IJ SOLN
INTRAMUSCULAR | Status: AC
  Filled 2017-10-28: qty 5

## 2017-10-28 MED ORDER — LIDOCAINE 2% (20 MG/ML) 5 ML SYRINGE
INTRAMUSCULAR | Status: AC
Start: 2017-10-28 — End: 2017-10-28
  Filled 2017-10-28: qty 5

## 2017-10-28 MED ORDER — DIPHENHYDRAMINE HCL 25 MG PO CAPS: 25.0000 mg | ORAL_CAPSULE | Freq: Four times a day (QID) | ORAL | Status: DC | PRN

## 2017-10-28 MED ORDER — HEMOSTATIC AGENTS (NO CHARGE) OPTIME
TOPICAL | Status: DC | PRN
  Administered 2017-10-28: 1 via TOPICAL

## 2017-10-28 MED ORDER — LACTATED RINGERS IV SOLN
INTRAVENOUS | Status: DC | PRN
  Administered 2017-10-28: 13:00:00 via INTRAVENOUS

## 2017-10-28 MED ORDER — INSULIN ASPART 100 UNIT/ML ~~LOC~~ SOLN
3.0000 [IU] | Freq: Three times a day (TID) | SUBCUTANEOUS | Status: DC
  Administered 2017-10-28 – 2017-10-29 (×2): 3 [IU] via SUBCUTANEOUS

## 2017-10-28 MED ORDER — ROCURONIUM BROMIDE 100 MG/10ML IV SOLN
INTRAVENOUS | Status: DC | PRN
  Administered 2017-10-28: 40 mg via INTRAVENOUS

## 2017-10-28 MED ORDER — INSULIN ASPART 100 UNIT/ML ~~LOC~~ SOLN
0.0000 [IU] | Freq: Three times a day (TID) | SUBCUTANEOUS | Status: DC
  Administered 2017-10-28: 3 [IU] via SUBCUTANEOUS
  Administered 2017-10-29: 2 [IU] via SUBCUTANEOUS

## 2017-10-28 MED ORDER — CHLORHEXIDINE GLUCONATE CLOTH 2 % EX PADS: 6.0000 | MEDICATED_PAD | Freq: Once | CUTANEOUS | Status: DC

## 2017-10-28 MED ORDER — SODIUM CHLORIDE 0.9 % IR SOLN
Status: DC | PRN
  Administered 2017-10-28: 1000 mL

## 2017-10-28 MED ORDER — ONDANSETRON HCL 4 MG/2ML IJ SOLN
INTRAMUSCULAR | Status: DC | PRN
  Administered 2017-10-28: 4 mg via INTRAVENOUS

## 2017-10-28 MED ORDER — FENTANYL CITRATE (PF) 100 MCG/2ML IJ SOLN: 25.0000 ug | INTRAMUSCULAR | Status: DC | PRN

## 2017-10-28 MED ORDER — PREDNISONE 10 MG PO TABS: 10.0000 mg | ORAL_TABLET | Freq: Every day | ORAL | Status: DC | PRN

## 2017-10-28 MED ORDER — MEPERIDINE HCL 50 MG/ML IJ SOLN: 6.2500 mg | INTRAMUSCULAR | Status: DC | PRN

## 2017-10-28 MED ORDER — DEXAMETHASONE SODIUM PHOSPHATE 10 MG/ML IJ SOLN
INTRAMUSCULAR | Status: AC
  Filled 2017-10-28: qty 1

## 2017-10-28 MED ORDER — MORPHINE SULFATE (PF) 4 MG/ML IV SOLN
2.0000 mg | INTRAVENOUS | Status: DC | PRN
Start: 2017-10-28 — End: 2017-10-29

## 2017-10-28 MED ORDER — HYDRALAZINE HCL 20 MG/ML IJ SOLN: 10.0000 mg | INTRAMUSCULAR | Status: DC | PRN

## 2017-10-28 MED ORDER — ACETAMINOPHEN 325 MG PO TABS
650.0000 mg | ORAL_TABLET | Freq: Four times a day (QID) | ORAL | Status: DC | PRN
  Administered 2017-10-28: 650 mg via ORAL
  Filled 2017-10-28: qty 2

## 2017-10-28 MED ORDER — ONDANSETRON HCL 4 MG/2ML IJ SOLN
INTRAMUSCULAR | Status: AC
Start: 2017-10-28 — End: 2017-10-28
  Filled 2017-10-28: qty 2

## 2017-10-28 MED ORDER — PHENYLEPHRINE HCL 10 MG/ML IJ SOLN
INTRAVENOUS | Status: DC | PRN
  Administered 2017-10-28: 20 ug/min via INTRAVENOUS

## 2017-10-28 MED ORDER — PHENYLEPHRINE 40 MCG/ML (10ML) SYRINGE FOR IV PUSH (FOR BLOOD PRESSURE SUPPORT)
PREFILLED_SYRINGE | INTRAVENOUS | Status: AC
  Filled 2017-10-28: qty 10

## 2017-10-28 MED ORDER — SODIUM CHLORIDE 0.9 % IV SOLN
INTRAVENOUS | Status: DC
  Administered 2017-10-28: 17:00:00 via INTRAVENOUS

## 2017-10-28 MED ORDER — ENOXAPARIN SODIUM 40 MG/0.4ML ~~LOC~~ SOLN
40.0000 mg | SUBCUTANEOUS | Status: DC
  Administered 2017-10-29: 40 mg via SUBCUTANEOUS
  Filled 2017-10-28: qty 0.4

## 2017-10-28 MED ORDER — DILTIAZEM HCL ER COATED BEADS 180 MG PO CP24
180.0000 mg | ORAL_CAPSULE | Freq: Every day | ORAL | Status: DC
  Administered 2017-10-28 – 2017-10-29 (×2): 180 mg via ORAL
  Filled 2017-10-28 (×3): qty 1

## 2017-10-28 MED ORDER — CISATRACURIUM 2MG/ML (10ML) SYRINGE FOR MED FUSION PUMP - OPTIME
INTRAVENOUS | Status: DC | PRN
  Administered 2017-10-28: 12 mg via INTRAVENOUS
  Administered 2017-10-28: 4 mg via INTRAVENOUS

## 2017-10-28 MED ORDER — OXYCODONE HCL 5 MG PO TABS
5.0000 mg | ORAL_TABLET | ORAL | Status: DC | PRN
  Administered 2017-10-28: 10 mg via ORAL
  Administered 2017-10-29 (×2): 5 mg via ORAL
  Filled 2017-10-28: qty 2
  Filled 2017-10-28 (×2): qty 1

## 2017-10-28 MED ORDER — CEFAZOLIN SODIUM-DEXTROSE 2-4 GM/100ML-% IV SOLN
2.0000 g | INTRAVENOUS | Status: AC
Start: 2017-10-28 — End: 2017-10-28
  Administered 2017-10-28: 2 g via INTRAVENOUS
  Filled 2017-10-28: qty 100

## 2017-10-28 MED ORDER — FENTANYL CITRATE (PF) 250 MCG/5ML IJ SOLN
INTRAMUSCULAR | Status: DC | PRN
  Administered 2017-10-28 (×5): 50 ug via INTRAVENOUS

## 2017-10-28 MED ORDER — SUGAMMADEX SODIUM 200 MG/2ML IV SOLN
INTRAVENOUS | Status: DC | PRN
  Administered 2017-10-28: 200 mg via INTRAVENOUS

## 2017-10-28 MED ORDER — ONDANSETRON 4 MG PO TBDP: 4.0000 mg | ORAL_TABLET | Freq: Four times a day (QID) | ORAL | Status: DC | PRN

## 2017-10-28 MED ORDER — CISATRACURIUM BESYLATE 20 MG/10ML IV SOLN
INTRAVENOUS | Status: AC
  Filled 2017-10-28: qty 10

## 2017-10-28 MED ORDER — 0.9 % SODIUM CHLORIDE (POUR BTL) OPTIME
TOPICAL | Status: DC | PRN
  Administered 2017-10-28: 1000 mL

## 2017-10-28 MED ORDER — METOCLOPRAMIDE HCL 5 MG/ML IJ SOLN: 10.0000 mg | Freq: Once | INTRAMUSCULAR | Status: DC | PRN

## 2017-10-28 MED ORDER — ONDANSETRON HCL 4 MG/2ML IJ SOLN: 4.0000 mg | Freq: Four times a day (QID) | INTRAMUSCULAR | Status: DC | PRN

## 2017-10-28 MED ORDER — ACETAMINOPHEN 650 MG RE SUPP: 650.0000 mg | Freq: Four times a day (QID) | RECTAL | Status: DC | PRN

## 2017-10-28 MED ORDER — LIDOCAINE 2% (20 MG/ML) 5 ML SYRINGE
INTRAMUSCULAR | Status: DC | PRN
  Administered 2017-10-28: 100 mg via INTRAVENOUS

## 2017-10-28 MED ORDER — BUPIVACAINE-EPINEPHRINE 0.5% -1:200000 IJ SOLN
INTRAMUSCULAR | Status: DC | PRN
  Administered 2017-10-28: 10 mL

## 2017-10-28 MED ORDER — DIPHENHYDRAMINE HCL 50 MG/ML IJ SOLN: 25.0000 mg | Freq: Four times a day (QID) | INTRAMUSCULAR | Status: DC | PRN

## 2017-10-28 MED ORDER — NEOSTIGMINE METHYLSULFATE 5 MG/5ML IV SOSY
PREFILLED_SYRINGE | INTRAVENOUS | Status: AC
Start: 2017-10-28 — End: 2017-10-28
  Filled 2017-10-28: qty 5

## 2017-10-28 MED ORDER — DEXAMETHASONE SODIUM PHOSPHATE 10 MG/ML IJ SOLN
INTRAMUSCULAR | Status: DC | PRN
  Administered 2017-10-28: 4 mg via INTRAVENOUS

## 2017-10-28 MED ORDER — SODIUM BICARBONATE 650 MG PO TABS
650.0000 mg | ORAL_TABLET | Freq: Every day | ORAL | Status: DC
  Administered 2017-10-28 – 2017-10-29 (×2): 650 mg via ORAL
  Filled 2017-10-28 (×2): qty 1

## 2017-10-28 MED ORDER — PROPOFOL 10 MG/ML IV BOLUS
INTRAVENOUS | Status: DC | PRN
  Administered 2017-10-28: 150 mg via INTRAVENOUS

## 2017-10-28 MED ORDER — PHENYLEPHRINE 40 MCG/ML (10ML) SYRINGE FOR IV PUSH (FOR BLOOD PRESSURE SUPPORT)
PREFILLED_SYRINGE | INTRAVENOUS | Status: DC | PRN
  Administered 2017-10-28 (×4): 80 ug via INTRAVENOUS

## 2017-10-28 MED ORDER — CISATRACURIUM BESYLATE 20 MG/10ML IV SOLN
INTRAVENOUS | Status: AC
Start: 2017-10-28 — End: 2017-10-28
  Filled 2017-10-28: qty 10

## 2017-10-28 SURGICAL SUPPLY — 45 items
APL SKNCLS STERI-STRIP NONHPOA (GAUZE/BANDAGES/DRESSINGS) ×1
APPLIER CLIP ROT 10 11.4 M/L (STAPLE) ×2
APR CLP MED LRG 11.4X10 (STAPLE) ×1
BAG SPEC RTRVL LRG 6X4 10 (ENDOMECHANICALS) ×1
BENZOIN TINCTURE PRP APPL 2/3 (GAUZE/BANDAGES/DRESSINGS) ×2 IMPLANT
BLADE CLIPPER SURG (BLADE) IMPLANT
CANISTER SUCT 3000ML PPV (MISCELLANEOUS) ×2 IMPLANT
CHLORAPREP W/TINT 26ML (MISCELLANEOUS) ×2 IMPLANT
CLIP APPLIE ROT 10 11.4 M/L (STAPLE) ×1 IMPLANT
COVER MAYO STAND STRL (DRAPES) ×2 IMPLANT
COVER SURGICAL LIGHT HANDLE (MISCELLANEOUS) ×2 IMPLANT
DRAPE C-ARM 42X72 X-RAY (DRAPES) ×2 IMPLANT
DRSG TEGADERM 2-3/8X2-3/4 SM (GAUZE/BANDAGES/DRESSINGS) ×6 IMPLANT
DRSG TEGADERM 4X4.75 (GAUZE/BANDAGES/DRESSINGS) ×2 IMPLANT
ELECT REM PT RETURN 9FT ADLT (ELECTROSURGICAL) ×2
ELECTRODE REM PT RTRN 9FT ADLT (ELECTROSURGICAL) ×1 IMPLANT
FILTER SMOKE EVAC LAPAROSHD (FILTER) ×2 IMPLANT
GAUZE SPONGE 2X2 8PLY STRL LF (GAUZE/BANDAGES/DRESSINGS) ×1 IMPLANT
GLOVE BIO SURGEON STRL SZ7 (GLOVE) ×2 IMPLANT
GLOVE BIOGEL PI IND STRL 7.5 (GLOVE) ×1 IMPLANT
GLOVE BIOGEL PI INDICATOR 7.5 (GLOVE) ×1
GOWN STRL REUS W/ TWL LRG LVL3 (GOWN DISPOSABLE) ×3 IMPLANT
GOWN STRL REUS W/TWL LRG LVL3 (GOWN DISPOSABLE) ×6
HEMOSTAT SNOW SURGICEL 2X4 (HEMOSTASIS) ×1 IMPLANT
KIT BASIN OR (CUSTOM PROCEDURE TRAY) ×2 IMPLANT
KIT ROOM TURNOVER OR (KITS) ×2 IMPLANT
NS IRRIG 1000ML POUR BTL (IV SOLUTION) ×2 IMPLANT
PAD ARMBOARD 7.5X6 YLW CONV (MISCELLANEOUS) ×2 IMPLANT
POUCH SPECIMEN RETRIEVAL 10MM (ENDOMECHANICALS) ×2 IMPLANT
SCISSORS LAP 5X35 DISP (ENDOMECHANICALS) ×2 IMPLANT
SET CHOLANGIOGRAPH 5 50 .035 (SET/KITS/TRAYS/PACK) ×2 IMPLANT
SET IRRIG TUBING LAPAROSCOPIC (IRRIGATION / IRRIGATOR) ×2 IMPLANT
SLEEVE ENDOPATH XCEL 5M (ENDOMECHANICALS) ×2 IMPLANT
SPECIMEN JAR SMALL (MISCELLANEOUS) ×2 IMPLANT
SPONGE GAUZE 2X2 STER 10/PKG (GAUZE/BANDAGES/DRESSINGS) ×1
STRIP CLOSURE SKIN 1/2X4 (GAUZE/BANDAGES/DRESSINGS) ×2 IMPLANT
SUT MNCRL AB 4-0 PS2 18 (SUTURE) ×2 IMPLANT
TOWEL OR 17X24 6PK STRL BLUE (TOWEL DISPOSABLE) ×2 IMPLANT
TOWEL OR 17X26 10 PK STRL BLUE (TOWEL DISPOSABLE) ×2 IMPLANT
TRAY LAPAROSCOPIC MC (CUSTOM PROCEDURE TRAY) ×2 IMPLANT
TROCAR XCEL BLUNT TIP 100MML (ENDOMECHANICALS) ×2 IMPLANT
TROCAR XCEL NON-BLD 11X100MML (ENDOMECHANICALS) ×2 IMPLANT
TROCAR XCEL NON-BLD 5MMX100MML (ENDOMECHANICALS) ×2 IMPLANT
TUBING INSUFFLATION (TUBING) ×2 IMPLANT
WATER STERILE IRR 1000ML POUR (IV SOLUTION) ×2 IMPLANT

## 2017-10-28 NOTE — Op Note (Signed)
Laparoscopic Cholecystectomy with IOC Procedure Note  Indications: The patient is a 67 year old male who presents for evaluation of gall stones. The patient was seen as a consultation in the hospital on 09/07/16. He had been hospitalized at [facility] for pneumonia. He developed acalculous cholecystitis. He was then transferred to [facility] for further management. The patient has significant medical comorbidities. We recommended percutaneous cholecystostomy. This was placed on 09/08/16. He was finally discharged on 09/29/16. His discharge diagnoses included hypoxia, elevated troponin, acute kidney injury, sepsis, right lower lobe pneumonia, as well as acalculous cholecystitis. The patient underwent repeat CT scan as well as a cholangiogram via his percutaneous tube on 10/15/16. This showed that the cystic duct is patent. The CT scan showed no sign of calcified gallstones. The perihepatic fluid collections were nearly resolved. The cholecystostomy was removed.   A few days after the tube was removed, the patient had a single episode of nausea and vomiting as well as diarrhea. He reports no abdominal pain with this episode. This lasted for about an hour. He has not had any further episodes since that time. His family asked that he come in for another evaluation because they're concerned that his gallbladder is still having problems.Bowel movements have returned to normal.   He was last seen about 9 months ago. His overall medical status has improved. He recently presented to the ED at [facility] complaining of pain in the epigastrium associated with nausea, vomiting, and bloating. He has had several episodes that are usually related to eating. He presents now for cholecystectomy.      Pre-operative Diagnosis: Calculus of gallbladder with other cholecystitis, without mention of obstruction.  S/p percutaneous cholecystostomy tube placement  Post-operative Diagnosis: Same  Surgeon:  Gary Lang   Assistants: none  Anesthesia: General endotracheal anesthesia  ASA Class: 3  Procedure Details  The patient was seen again in the Holding Room. The risks, benefits, complications, treatment options, and expected outcomes were discussed with the patient. The possibilities of reaction to medication, pulmonary aspiration, perforation of viscus, bleeding, recurrent infection, finding a normal gallbladder, the need for additional procedures, failure to diagnose a condition, the possible need to convert to an open procedure, and creating a complication requiring transfusion or operation were discussed with the patient. The likelihood of improving the patient's symptoms with return to their baseline status is good.  The patient and/or family concurred with the proposed plan, giving informed consent. The site of surgery properly noted. The patient was taken to Operating Room, identified as Gary L Camey Sr. and the procedure verified as Laparoscopic Cholecystectomy with Intraoperative Cholangiogram. A Time Out was held and the above information confirmed.  Prior to the induction of general anesthesia, antibiotic prophylaxis was administered. General endotracheal anesthesia was then administered and tolerated well. After the induction, the abdomen was prepped with Chloraprep and draped in the sterile fashion. The patient was positioned in the supine position.  Local anesthetic agent was injected into the skin near the umbilicus and an incision made. We dissected down to the abdominal fascia with blunt dissection.  The fascia was incised vertically and we entered the peritoneal cavity bluntly.  A pursestring suture of 0-Vicryl was placed around the fascial opening.  The Hasson cannula was inserted and secured with the stay suture.  Pneumoperitoneum was then created with CO2 and tolerated well without any adverse changes in the patient's vital signs. An 11-mm port was placed in the subxiphoid  position.  Two 5-mm ports  were placed in the right upper quadrant. All skin incisions were infiltrated with a local anesthetic agent before making the incision and placing the trocars.   We positioned the patient in reverse Trendelenburg, tilted slightly to the patient's left.  The liver is densely adherent to the anterior abdominal wall and the omentum is adherent to the liver.  We used cautery and blunt dissection to mobilize the omentum away from the liver.   The gallbladder was identified, the fundus grasped and retracted cephalad. Adhesions were lysed bluntly and with the electrocautery where indicated, taking care not to injure any adjacent organs or viscus. The infundibulum was grasped and retracted laterally, exposing the peritoneum overlying the triangle of Calot. This was then divided and exposed in a blunt fashion. A critical view of the cystic duct and cystic artery was obtained.  The cystic duct was clearly identified and bluntly dissected circumferentially. The cystic duct was ligated with a clip distally.   An incision was made in the cystic duct and the [facility] cholangiogram catheter introduced. The catheter was secured using a clip. A cholangiogram was then obtained which showed good visualization of the distal and proximal biliary tree with no sign of filling defects or obstruction.  Contrast flowed easily into the duodenum. The catheter was then removed.   The cystic duct was then ligated with clips and divided. The cystic artery was identified, dissected free, ligated with clips and divided as well.  A small posterior branch was encountered and controlled with clips.  The gallbladder was dissected from the liver bed in retrograde fashion with the electrocautery. The gallbladder was removed and placed in an Endocatch sac. The liver bed was irrigated and inspected. Hemostasis was achieved with the electrocautery and Surgicel SNOW.. Copious irrigation was utilized and was repeatedly aspirated  until clear.  The gallbladder and Endocatch sac were then removed through the umbilical port site.  The pursestring suture was used to close the umbilical fascia.    We again inspected the right upper quadrant for hemostasis.  Pneumoperitoneum was released as we removed the trocars.  4-0 Monocryl was used to close the skin.   Benzoin, steri-strips, and clean dressings were applied. The patient was then extubated and brought to the recovery room in stable condition. Instrument, sponge, and needle counts were correct at closure and at the conclusion of the case.   Findings: Cholecystitis with Cholelithiasis  Estimated Blood Loss: less than 100 mL         Drains: none         Specimens: Gallbladder           Complications: None; patient tolerated the procedure well.         Disposition: PACU - hemodynamically stable.         Condition: stable  [name], MD, [facility]  General/ Trauma Surgery  10/28/2017 3:18 PM

## 2017-10-28 NOTE — Transfer of Care (Signed)
Immediate Anesthesia Transfer of Care Note  Patient: Gary L Cafarella Sr.  Procedure(s) Performed: LAPAROSCOPIC CHOLECYSTECTOMY WITH INTRAOPERATIVE CHOLANGIOGRAM (N/A Abdomen)  Patient Location: PACU  Anesthesia Type:General  Level of Consciousness: drowsy and responds to stimulation  Airway & Oxygen Therapy: Patient Spontanous Breathing and Patient connected to nasal cannula oxygen  Post-op Assessment: Report given to RN and Post -op Vital signs reviewed and stable  Post vital signs: Reviewed and stable  Last Vitals:  Vitals:   10/28/17 1000 10/28/17 1531  BP: (!) 149/62   Pulse: 81   Resp: 18   Temp: 36.6 C (P) 36.4 C  SpO2: 100%     Last Pain:  Vitals:   10/28/17 1000  TempSrc: Oral         Complications: No apparent anesthesia complications

## 2017-10-28 NOTE — Progress Notes (Signed)
Patient arrived to the department via stretcher, transferred to bed. Alert, oriented X4.  Incision dressings are clean, dry and intact. VSS. Patient oriented to the room and instructed to call if he needs any assistance.Will release orders and continue to monitor patient.

## 2017-10-28 NOTE — Anesthesia Preprocedure Evaluation (Signed)
Anesthesia Evaluation  Patient identified by MRN, date of birth, ID band Patient awake    Reviewed: Allergy & Precautions, NPO status , Patient's Chart, lab work & pertinent test results  Airway Mallampati: II  TM Distance: >3 FB Neck ROM: Full    Dental no notable dental hx.    Pulmonary neg pulmonary ROS, former smoker,    Pulmonary exam normal breath sounds clear to auscultation       Cardiovascular hypertension, Pt. on medications negative cardio ROS Normal cardiovascular exam+ dysrhythmias Atrial Fibrillation  Rhythm:Regular Rate:Normal     Neuro/Psych negative neurological ROS  negative psych ROS   GI/Hepatic negative GI ROS, Neg liver ROS,   Endo/Other  diabetes, Poorly Controlled, Type 2  Renal/GU Renal InsufficiencyRenal disease  negative genitourinary   Musculoskeletal  (+) Arthritis , Rheumatoid disorders,    Abdominal   Peds negative pediatric ROS (+)  Hematology negative hematology ROS (+)   Anesthesia Other Findings   Reproductive/Obstetrics negative OB ROS                             Anesthesia Physical Anesthesia Plan  ASA: III  Anesthesia Plan: General   Post-op Pain Management:    Induction: Intravenous  PONV Risk Score and Plan: 2 and Ondansetron and Treatment may vary due to age or medical condition  Airway Management Planned: Oral ETT  Additional Equipment:   Intra-op Plan:   Post-operative Plan: Extubation in OR  Informed Consent: I have reviewed the patients History and Physical, chart, labs and discussed the procedure including the risks, benefits and alternatives for the proposed anesthesia with the patient or authorized representative who has indicated his/her understanding and acceptance.   Dental advisory given  Plan Discussed with: CRNA  Anesthesia Plan Comments:         Anesthesia Quick Evaluation

## 2017-10-28 NOTE — Anesthesia Postprocedure Evaluation (Signed)
Anesthesia Post Note  Patient: Gary L Myrick Sr.  Procedure(s) Performed: LAPAROSCOPIC CHOLECYSTECTOMY WITH INTRAOPERATIVE CHOLANGIOGRAM (N/A Abdomen)     Patient location during evaluation: PACU Anesthesia Type: General Level of consciousness: awake and alert Pain management: pain level controlled Vital Signs Assessment: post-procedure vital signs reviewed and stable Respiratory status: spontaneous breathing, nonlabored ventilation, respiratory function stable and patient connected to nasal cannula oxygen Cardiovascular status: blood pressure returned to baseline and stable Postop Assessment: no apparent nausea or vomiting Anesthetic complications: no    Last Vitals:  Vitals:   10/28/17 1630 10/28/17 1652  BP: (!) 150/71 (!) 158/72  Pulse: 82 81  Resp: 18 17  Temp:  36.7 C  SpO2: 94% 95%    Last Pain:  Vitals:   10/28/17 2013  TempSrc:   PainSc: Butler Geraldin Habermehl

## 2017-10-28 NOTE — H&P (Signed)
History of Present Illness  The patient is a 67 year old male who presents for evaluation of gall stones. The patient was seen as a consultation in the hospital on 09/07/16. He had been hospitalized at [facility] for pneumonia. He developed acalculous cholecystitis. He was then transferred to [facility] for further management. The patient has significant medical comorbidities. We recommended percutaneous cholecystostomy. This was placed on 09/08/16. He was finally discharged on 09/29/16. His discharge diagnoses included hypoxia, elevated troponin, acute kidney injury, sepsis, right lower lobe pneumonia, as well as acalculous cholecystitis. The patient underwent repeat CT scan as well as a cholangiogram via his percutaneous tube on 10/15/16. This showed that the cystic duct is patent. The CT scan showed no sign of calcified gallstones. The perihepatic fluid collections were nearly resolved. The cholecystostomy was removed.    A few days after the tube was removed, the patient had a single episode of nausea and vomiting as well as diarrhea. He reports no abdominal pain with this episode. This lasted for about an hour. He has not had any further episodes since that time. His family asked that he come in for another evaluation because they're concerned that his gallbladder is still having problems. Bowel movements have returned to normal.    He was last seen about 9 months ago. His overall medical status has improved. He recently presented to the ED at [facility] complaining of pain in the epigastrium associated with nausea, vomiting, and bloating. He has had several episodes that are usually related to eating. CT scan and labs were obtained.      EXAM:  CT ABDOMEN AND PELVIS WITH CONTRAST    TECHNIQUE:  Multidetector CT imaging of the abdomen and pelvis was performed  using the standard protocol following bolus administration of  intravenous contrast.    CONTRAST:  179mL ISOVUE-300 IOPAMIDOL (ISOVUE-300) INJECTION 61%    COMPARISON: 10/15/2016    FINDINGS:  Lower chest: Lung bases demonstrate some mild atelectatic changes.  No focal infiltrate is seen.    Hepatobiliary: Liver demonstrates evidence of mild intrahepatic  biliary ductal dilatation as well as prominence of the common bile  duct with normal tapering distally. The gallbladder is partially  distended. Some mild irregularity inflammatory changes noted  surrounding the fundus of the gallbladder. This may be in part due  to scarring from prior cholecystostomy tube. Correlation with  laboratory values is recommended.    Pancreas: Within normal limits.    Spleen: Normal in size without focal abnormality.    Adrenals/Urinary Tract: Renal cystic change is again identified. No  calculi are noted. The adrenal glands are within normal limits. The  bladder is partially distended.    Stomach/Bowel: Scattered diverticular change of the colon is noted  without evidence of diverticulitis. The appendix is within normal  limits. No obstructive or inflammatory bowel changes are noted.    Vascular/Lymphatic: No significant vascular findings are present. No  enlarged abdominal or pelvic lymph nodes.    Reproductive: Prostate is unremarkable.    Other: Large fat containing left inguinal hernia is noted. No  incarcerated bowel is noted. No free fluid is seen.    Musculoskeletal: Mild degenerative changes of the lumbar spine are  seen.    IMPRESSION:  Mild irregularity surrounding the gallbladder which may be in part  due to prior cholecystostomy. The need for further evaluation can be  determined on a clinical basis. Correlation laboratory values is  recommended    Biliary ductal dilatation is  noted although normal tapering is seen  distally. No obstructing calculus is noted. Correlation with  laboratory values is recommended    Diverticulosis without diverticulitis.    Fat containing  inguinal hernia on the left.      Electronically Signed  By: Inez Catalina M.D.  On: 07/18/2017 20:02      CBC on 11/16 - WBC 11.1, Hgb 12.0  Cr - 1.5  LFT's WNL  Lipase - 32        Physical Exam    The physical exam findings are as follows:  Note:WDWN in NAD - he seems much stronger and energetic compared to last visit  Eyes: Pupils equal, round; sclera anicteric  HENT: Oral mucosa moist; good dentition  Neck: No masses palpated, no thyromegaly  Left forearm AV fistula  Lungs: CTA bilaterally; normal respiratory effort  CV: Regular rate and rhythm; no murmurs; extremities well-perfused with no edema  Abd: +bowel sounds, soft, mildly tender in epigastrium and RUQ; no palpable masses; no rebound or guarding  Skin: Warm, dry; no sign of jaundice  Psychiatric - alert and oriented x 4; calm mood and affect        Assessment & Plan    HISTORY OF ACUTE CHOLECYSTITIS (Z87.19)    Current Plans  Schedule for Surgery - Laparoscopic cholecystectomy with intraoperative cholangiogram. The surgical procedure has been discussed with the patient. Potential risks, benefits, alternative treatments, and expected outcomes have been explained. All of the patient's questions at this time have been answered. The likelihood of reaching the patient's treatment goal is good. The patient understand the proposed surgical procedure and wishes to proceed.  NoteHis constellation of symptoms and the scarring noted on the CT scan are consistent with gallbladder disease.His overall medical status seems to be improved. We will obtain cardiac clearance and then plan laparoscopic cholecystectomy with intraoperative cholangiogram.    .[email or web address]

## 2017-10-28 NOTE — Anesthesia Procedure Notes (Signed)
Procedure Name: Intubation Date/Time: 10/28/2017 1:47 PM Performed by: Renato Shin, CRNA Pre-anesthesia Checklist: Patient identified Patient Re-evaluated:Patient Re-evaluated prior to induction Oxygen Delivery Method: Circle system utilized Preoxygenation: Pre-oxygenation with 100% oxygen Induction Type: IV induction Ventilation: Mask ventilation without difficulty and Oral airway inserted - appropriate to patient size Laryngoscope Size: Miller and 3 Grade View: Grade I Tube type: Oral Tube size: 7.5 mm Number of attempts: 1 Airway Equipment and Method: Stylet Placement Confirmation: ETT inserted through vocal cords under direct vision,  positive ETCO2,  CO2 detector and breath sounds checked- equal and bilateral Secured at: 20 cm Tube secured with: Tape Dental Injury: Teeth and Oropharynx as per pre-operative assessment

## 2017-10-29 ENCOUNTER — Other Ambulatory Visit: Payer: Self-pay

## 2017-10-29 ENCOUNTER — Encounter (HOSPITAL_COMMUNITY): Payer: Self-pay | Admitting: Surgery

## 2017-10-29 DIAGNOSIS — K8010 Calculus of gallbladder with chronic cholecystitis without obstruction: Secondary | ICD-10-CM | POA: Diagnosis not present

## 2017-10-29 LAB — COMPREHENSIVE METABOLIC PANEL
ALBUMIN: 2.7 g/dL — AB (ref 3.5–5.0)
ALT: 59 U/L (ref 17–63)
AST: 63 U/L — AB (ref 15–41)
Alkaline Phosphatase: 59 U/L (ref 38–126)
Anion gap: 8 (ref 5–15)
BUN: 19 mg/dL (ref 6–20)
CHLORIDE: 108 mmol/L (ref 101–111)
CO2: 21 mmol/L — AB (ref 22–32)
CREATININE: 1.44 mg/dL — AB (ref 0.61–1.24)
Calcium: 7.4 mg/dL — ABNORMAL LOW (ref 8.9–10.3)
GFR calc Af Amer: 57 mL/min — ABNORMAL LOW (ref >60)
GFR calc non Af Amer: 49 mL/min — ABNORMAL LOW (ref >60)
GLUCOSE: 180 mg/dL — AB (ref 65–99)
POTASSIUM: 4.5 mmol/L (ref 3.5–5.1)
SODIUM: 137 mmol/L (ref 135–145)
Total Bilirubin: 1 mg/dL (ref 0.3–1.2)
Total Protein: 6.1 g/dL — ABNORMAL LOW (ref 6.5–8.1)

## 2017-10-29 LAB — CBC
HEMATOCRIT: 35.2 % — AB (ref 39.0–52.0)
Hemoglobin: 11.5 g/dL — ABNORMAL LOW (ref 13.0–17.0)
MCH: 27.7 pg (ref 26.0–34.0)
MCHC: 32.7 g/dL (ref 30.0–36.0)
MCV: 84.8 fL (ref 78.0–100.0)
PLATELETS: 233 10*3/uL (ref 150–400)
RBC: 4.15 MIL/uL — ABNORMAL LOW (ref 4.22–5.81)
RDW: 14.3 % (ref 11.5–15.5)
WBC: 11.8 10*3/uL — AB (ref 4.0–10.5)

## 2017-10-29 LAB — GLUCOSE, CAPILLARY: GLUCOSE-CAPILLARY: 187 mg/dL — AB (ref 65–99)

## 2017-10-29 MED ORDER — OXYCODONE HCL 5 MG PO TABS: 5.0000 mg | ORAL_TABLET | ORAL | 0 refills | Status: DC | PRN

## 2017-10-29 NOTE — Discharge Instructions (Signed)
[facility] ______[facility] °LAPAROSCOPIC SURGERY: POST OP INSTRUCTIONS °Always review your discharge instruction sheet given to you by the facility where your surgery was performed. °IF YOU HAVE DISABILITY OR FAMILY LEAVE FORMS, YOU MUST BRING THEM TO THE OFFICE FOR PROCESSING.   °DO NOT GIVE THEM TO YOUR DOCTOR. ° °1. A prescription for pain medication may be given to you upon discharge.  Take your pain medication as prescribed, if needed.  If narcotic pain medicine is not needed, then you may take acetaminophen (Tylenol) or ibuprofen (Advil) as needed. °2. Take your usually prescribed medications unless otherwise directed. °3. If you need a refill on your pain medication, please contact your pharmacy.  They will contact our office to request authorization. Prescriptions will not be filled after 5pm or on week-ends. °4. You should follow a light diet the first few days after arrival home, such as soup and crackers, etc.  Be sure to include lots of fluids daily. °5. Most patients will experience some swelling and bruising in the area of the incisions.  Ice packs will help.  Swelling and bruising can take several days to resolve.  °6. It is common to experience some constipation if taking pain medication after surgery.  Increasing fluid intake and taking a stool softener (such as Colace) will usually help or prevent this problem from occurring.  A mild laxative (Milk of Magnesia or Miralax) should be taken according to package instructions if there are no bowel movements after 48 hours. °7. Unless discharge instructions indicate otherwise, you may remove your bandages 24-48 hours after surgery, and you may shower at that time.  You may have steri-strips (small skin tapes) in place directly over the incision.  These strips should be left on the skin for 7-10 days.  If your surgeon used skin glue on the incision, you may shower in 24 hours.  The glue will flake off over the next 2-3 weeks.  Any sutures or  staples will be removed at the office during your follow-up visit. °8. ACTIVITIES:  You may resume regular (light) daily activities beginning the next day--such as daily self-care, walking, climbing stairs--gradually increasing activities as tolerated.  You may have sexual intercourse when it is comfortable.  Refrain from any heavy lifting or straining until approved by your doctor. °a. You may drive when you are no longer taking prescription pain medication, you can comfortably wear a seatbelt, and you can safely maneuver your car and apply brakes. °b. RETURN TO WORK:  __________________________________________________________ °9. You should see your doctor in the office for a follow-up appointment approximately 2-3 weeks after your surgery.  Make sure that you call for this appointment within a day or two after you arrive home to insure a convenient appointment time. °10. OTHER INSTRUCTIONS: __________________________________________________________________________________________________________________________ __________________________________________________________________________________________________________________________ °WHEN TO CALL YOUR DOCTOR: °1. Fever over 101.0 °2. Inability to urinate °3. Continued bleeding from incision. °4. Increased pain, redness, or drainage from the incision. °5. Increasing abdominal pain ° °The clinic staff is available to answer your questions during regular business hours.  Please don’t hesitate to call and ask to speak to one of the nurses for clinical concerns.  If you have a medical emergency, go to the nearest emergency room or call 911.  A surgeon from [facility] is always on call at the hospital. °[address], [address], [place], [name]  [address] ? [address] [address], [place], [place]   [address] °[phone] ? [phone] ? FAX [phone] °Web site:   [email or web address] °

## 2017-10-29 NOTE — Progress Notes (Signed)
[id number] Patient discharged to home. Verbalizes understanding of all discharge instructions including incision care, discharge medications, and follow up MD visits. Patient accompanied by spouse.

## 2017-10-29 NOTE — Discharge Summary (Signed)
Physician Discharge Summary  Patient ID: Gary Abbe Heyer Sr. MRN: [id number] DOB/AGE: [birth year] 68 y.o.  Admit date: 10/28/2017 Discharge date: 10/29/2017  Admission Diagnoses:  Chronic calculus cholecystitis    Coronary artery disease  Discharge Diagnoses: same Active Problems:   Chronic cholecystitis with calculus   Discharged Condition: good  Hospital Course: Laparoscopic cholecystectomy with intraoperative cholangiogram on 2/26.  Kept overnight because of cardiac history.  No events.  Feeling well.  Minimal pain.  Tolerating diet.  Consults: None    Treatments: surgery: lap chole with IOC  Discharge Exam: Blood pressure 140/72, pulse 85, temperature 98.9 F (37.2 C), temperature source Oral, resp. rate 17, height 6' 1.5" (1.867 m), weight 109.2 kg (240 lb 11.9 oz), SpO2 96 %. WDWN in NAD Lungs - CTA B CV - RRR Abd - soft, minimal tenderness;  Dressing c/d/i   Disposition: 01-Home or Self Care  Discharge Instructions    Call MD for:  persistant nausea and vomiting   Complete by:  As directed    Call MD for:  redness, tenderness, or signs of infection (pain, swelling, redness, odor or green/yellow discharge around incision site)   Complete by:  As directed    Call MD for:  severe uncontrolled pain   Complete by:  As directed    Call MD for:  temperature >100.4   Complete by:  As directed    Diet general   Complete by:  As directed    Driving Restrictions   Complete by:  As directed    Do not drive while taking pain medications   Increase activity slowly   Complete by:  As directed    May shower / Bathe   Complete by:  As directed      Allergies as of 10/29/2017      Reactions   Glyburide    UNSPECIFIED REACTION    Methotrexate Derivatives    UNSPECIFIED REACTION       Medication List    TAKE these medications   acetaminophen 500 MG tablet Commonly known as:  TYLENOL Take 500 mg by mouth daily as needed for moderate pain.   CENTRUM SILVER 50+MEN  Tabs Take 1 tablet by mouth daily.   cholecalciferol 1000 units tablet Commonly known as:  VITAMIN D Take 1,000 Units by mouth daily.   diltiazem 180 MG 24 hr capsule Commonly known as:  CARDIZEM CD Take 180 mg by mouth daily.   diltiazem 240 MG 24 hr capsule Commonly known as:  CARTIA XT Take 1 capsule (240 mg total) by mouth daily.   ferrous sulfate 325 (65 FE) MG tablet Commonly known as:  FERROUSUL Take 1 tablet (325 mg total) by mouth daily with breakfast.   furosemide 40 MG tablet Commonly known as:  LASIX Take 1 tablet (40 mg total) by mouth 2 (two) times daily. What changed:  when to take this   hydrocortisone cream 1 % Apply 1 application topically daily as needed for itching.   insulin aspart 100 UNIT/ML FlexPen Commonly known as:  NOVOLOG FLEXPEN Inject 7.5 Units into the skin 3 (three) times daily with meals. What changed:  how much to take   Insulin Glargine 100 UNIT/ML Solostar Pen Commonly known as:  LANTUS Inject 35 Units into the skin every morning. Increase 5 u if am CBG is greater than 200.  Decrease 5 u if am CBG is less than 150 What changed:    how much to take  additional instructions   oxyCODONE 5  MG immediate release tablet Commonly known as:  Oxy IR/ROXICODONE Take 1 tablet (5 mg total) by mouth every 4 (four) hours as needed for moderate pain.   UNIFINE PENTIPS 31G X 5 MM Misc Generic drug:  Insulin Pen Needle USE AS DIRECTED WITH INSULIN   Pen Needles 31G X 6 MM Misc Use 1 needle per injection as directed   predniSONE 10 MG tablet Commonly known as:  DELTASONE Take 10 mg by mouth daily as needed (arthritis).   sodium bicarbonate 650 MG tablet Take 650 mg by mouth daily.   TRULICITY 1.5 UK/3.8VK Sopn Generic drug:  Dulaglutide Inject 0.5 mLs as directed every Wednesday.      Follow-up Information    Donnie Mesa, MD. Schedule an appointment as soon as possible for a visit in 3 week(s).   Specialty:  General  Surgery Contact information: [place] [address] [phone]           Signed: Maia Petties 10/29/2017, 8:50 AM

## 2017-11-02 NOTE — Progress Notes (Signed)
Cardiology Office Note   Date:  11/03/2017   ID:  Gary Noe Sr., DOB [birth year], MRN [id number]  PCP:  Celene Squibb, MD  Cardiologist:   Minus Breeding, MD   Chief Complaint  Patient presents with  . Atrial Fibrillation      History of Present Illness: Gary L Baehr Sr. is a 67 y.o. male who presents for follow up of atrial fib.  He had this in Jan when admitted with pneumonia.    Since I last saw him is PCP took him off of warfarin.  We discussed starting a DOAC when he was last seen by the PA.  However, he wanted to wait and talk to me.  When I saw him in the hospital I assumed that he was having paroxysmal atrial fib.  However, this was not proven.   He says that since I saw him he is done well.  He just had his gallbladder removed recovering from this.  He denies any palpitations. The patient denies any new symptoms such as chest discomfort, neck or arm discomfort. There has been no new shortness of breath, PND or orthopnea. There has been no presyncope or syncope.    Past Medical History:  Diagnosis Date  . Chronic cholecystitis   . Chronic kidney disease   . Diabetes mellitus without complication ([place])    Type II  . Hypertension   . Rheumatoid arthritis ([place])   . Vitamin D deficiency    Hx: of    Past Surgical History:  Procedure Laterality Date  . AV FISTULA PLACEMENT Left 06/28/2013   Procedure: ARTERIOVENOUS (AV) FISTULA CREATION- Left arm with ultrasound guidance;  Surgeon: Rosetta Posner, MD;  Location: [facility];  Service: Vascular;  Laterality: Left;  . CHOLECYSTECTOMY N/A 10/28/2017   Procedure: LAPAROSCOPIC CHOLECYSTECTOMY WITH INTRAOPERATIVE CHOLANGIOGRAM;  Surgeon: Donnie Mesa, MD;  Location: [facility];  Service: General;  Laterality: N/A;  . COLONOSCOPY     hx: of  . HERNIA REPAIR    . INSERTION OF DIALYSIS CATHETER N/A 06/28/2013   Procedure: INSERTION OF DIALYSIS CATHETER;  Surgeon: Rosetta Posner, MD;  Location: [facility];  Service: Vascular;   Laterality: N/A;  . IR GENERIC HISTORICAL  09/08/2016   IR PERC CHOLECYSTOSTOMY 09/08/2016 [name], MD [facility]  . IR GENERIC HISTORICAL  10/15/2016   IR RADIOLOGIST EVAL & MGMT 10/15/2016 Ardis Rowan, PA-C [facility]  . IR GENERIC HISTORICAL  10/29/2016   IR RADIOLOGIST EVAL & MGMT 10/29/2016 Jacqulynn Cadet, MD [facility]  . LIGATION OF COMPETING BRANCHES OF ARTERIOVENOUS FISTULA Left 10/12/2013   Procedure: LIGATION OF COMPETING BRANCHES OF ARTERIOVENOUS FISTULA;  Surgeon: Elam Dutch, MD;  Location: [facility] OR;  Service: Vascular;  Laterality: Left;  . neck surgery       Current Outpatient Medications  Medication Sig Dispense Refill  . acetaminophen (TYLENOL) 500 MG tablet Take 500 mg by mouth daily as needed for moderate pain.     . cholecalciferol (VITAMIN D) 1000 units tablet Take 1,000 Units by mouth daily.    Marland Kitchen diltiazem (CARDIZEM CD) 180 MG 24 hr capsule Take 180 mg by mouth daily.    . ferrous sulfate (FERROUSUL) 325 (65 FE) MG tablet Take 1 tablet (325 mg total) by mouth daily with breakfast. 30 tablet 3  . furosemide (LASIX) 40 MG tablet Take 40 mg by mouth daily.    . hydrocortisone cream 1 % Apply 1 application topically daily as  needed for itching.    . insulin aspart (NOVOLOG) 100 UNIT/ML injection Inject 16 Units into the skin 3 (three) times daily before meals.    . insulin glargine (LANTUS) 100 UNIT/ML injection Inject 50 Units into the skin at bedtime.    . Insulin Pen Needle (PEN NEEDLES) 31G X 6 MM MISC Use 1 needle per injection as directed 100 each 1  . Multiple Vitamins-Minerals (CENTRUM SILVER 50+MEN) TABS Take 1 tablet by mouth daily.    Marland Kitchen oxyCODONE (OXY IR/ROXICODONE) 5 MG immediate release tablet Take 1 tablet (5 mg total) by mouth every 4 (four) hours as needed for moderate pain. 15 tablet 0  . predniSONE (DELTASONE) 10 MG tablet Take 10 mg by mouth daily as needed (arthritis).     . sodium bicarbonate 650 MG tablet Take 650 mg by  mouth daily.     . TRULICITY 1.5 ID/7.8EU SOPN Inject 0.5 mLs as directed every Wednesday.     Marland Kitchen UNIFINE PENTIPS 31G X 5 MM MISC USE AS DIRECTED WITH INSULIN 100 each PRN   No current facility-administered medications for this visit.     Allergies:   Glyburide and Methotrexate derivatives    ROS:  Please see the history of present illness.   Otherwise, review of systems are positive for none.   All other systems are reviewed and negative.    PHYSICAL EXAM: VS:  BP (!) 148/76   Pulse 84   Ht 6\' 1"  (1.854 m)   Wt 234 lb 9.6 oz (106.4 kg)   SpO2 97%   BMI 30.95 kg/m  , BMI Body mass index is 30.95 kg/m. GENERAL:  Well appearing NECK:  No jugular venous distention, waveform within normal limits, carotid upstroke brisk and symmetric, no bruits, no thyromegaly LUNGS:  Clear to auscultation bilaterally CHEST:  Unremarkable HEART:  PMI not displaced or sustained,S1 and S2 within normal limits, no S3, no S4, no clicks, no rubs, no murmurs ABD:  Flat, positive bowel sounds normal in frequency in pitch, no bruits, no rebound, no guarding, no midline pulsatile mass, no hepatomegaly, no splenomegaly, well healed bandaged surgical scars. EXT:  2 plus pulses throughout, no edema, no cyanosis no clubbing   EKG:  EKG is ordered today.    Recent Labs: 10/29/2017: ALT 59; BUN 19; Creatinine, Ser 1.44; Hemoglobin 11.5; Platelets 233; Potassium 4.5; Sodium 137    Lipid Panel    Component Value Date/Time   CHOL 325 (H) 06/19/2013 0538   TRIG 145 06/19/2013 0538   HDL 46 06/19/2013 0538   CHOLHDL 7.1 06/19/2013 0538   VLDL 29 06/19/2013 0538   LDLCALC 250 (H) 06/19/2013 0538      Wt Readings from Last 3 Encounters:  11/03/17 234 lb 9.6 oz (106.4 kg)  10/28/17 240 lb 11.9 oz (109.2 kg)  10/22/17 237 lb 11.2 oz (107.8 kg)      Other studies Reviewed: Additional studies/ records that were reviewed today include: None. Review of the above records demonstrates:  Please see elsewhere in  the note.     ASSESSMENT AND PLAN:  PAF: We a long discussion about this.  He prefer not to be on an anticoagulant unless we prove that he is having paroxysms of atrial fibrillation and he is had absolutely no symptoms by his report.  He understands the risk of stroke which for him is about 3.2% if he does have undiagnosed paroxysms.  He is going to look into and consider getting an Alive Cor  HTN:  The blood pressure is  mildly elevated.  He thinks this is unusual and he  a blood pressure is going to keep a blood pressure diary for me.  CKD III: His creatinine has been slightly elevated.  He is going to check his blood pressure as above.  His hemoglobin A1c was 10 and he understands the importance of control and he will continue to follow with Celene Squibb, MD  Current medicines are reviewed at length with the patient today.  The patient does not have concerns regarding medicines.  The following changes have been made:  no change  Labs/ tests ordered today include: None No orders of the defined types were placed in this encounter.    Disposition:   FU with me as needed.      Signed, Minus Breeding, MD  11/03/2017 11:27 AM    [facility]

## 2017-11-03 ENCOUNTER — Ambulatory Visit: Payer: PPO | Admitting: Cardiology

## 2017-11-03 ENCOUNTER — Encounter: Payer: Self-pay | Admitting: Cardiology

## 2017-11-03 VITALS — BP 148/76 | HR 84 | Ht 73.0 in | Wt 234.6 lb

## 2017-11-03 DIAGNOSIS — I480 Paroxysmal atrial fibrillation: Secondary | ICD-10-CM | POA: Diagnosis not present

## 2017-11-03 DIAGNOSIS — I10 Essential (primary) hypertension: Secondary | ICD-10-CM | POA: Diagnosis not present

## 2017-11-03 NOTE — Patient Instructions (Signed)
Medication Instructions:  Continue current medications  If you need a refill on your cardiac medications before your next appointment, please call your pharmacy.  Labwork: None Ordered   Testing/Procedures: None Ordered  Follow-Up: Your physician wants you to follow-up in: As Needed.   ALIVECOR    Thank you for choosing [facility] at [facility]!!

## 2017-11-07 DIAGNOSIS — Z7901 Long term (current) use of anticoagulants: Secondary | ICD-10-CM | POA: Diagnosis not present

## 2017-11-07 DIAGNOSIS — N183 Chronic kidney disease, stage 3 (moderate): Secondary | ICD-10-CM | POA: Diagnosis not present

## 2017-11-07 DIAGNOSIS — R739 Hyperglycemia, unspecified: Secondary | ICD-10-CM | POA: Diagnosis not present

## 2017-11-07 DIAGNOSIS — N179 Acute kidney failure, unspecified: Secondary | ICD-10-CM | POA: Diagnosis not present

## 2017-11-07 DIAGNOSIS — E1122 Type 2 diabetes mellitus with diabetic chronic kidney disease: Secondary | ICD-10-CM | POA: Diagnosis not present

## 2017-11-07 DIAGNOSIS — E785 Hyperlipidemia, unspecified: Secondary | ICD-10-CM | POA: Diagnosis not present

## 2017-11-07 DIAGNOSIS — M059 Rheumatoid arthritis with rheumatoid factor, unspecified: Secondary | ICD-10-CM | POA: Diagnosis not present

## 2017-11-07 DIAGNOSIS — D631 Anemia in chronic kidney disease: Secondary | ICD-10-CM | POA: Diagnosis not present

## 2017-11-07 DIAGNOSIS — E782 Mixed hyperlipidemia: Secondary | ICD-10-CM | POA: Diagnosis not present

## 2017-11-07 DIAGNOSIS — K810 Acute cholecystitis: Secondary | ICD-10-CM | POA: Diagnosis not present

## 2017-11-07 DIAGNOSIS — I10 Essential (primary) hypertension: Secondary | ICD-10-CM | POA: Diagnosis not present

## 2017-11-07 DIAGNOSIS — I482 Chronic atrial fibrillation: Secondary | ICD-10-CM | POA: Diagnosis not present

## 2017-11-25 DIAGNOSIS — I10 Essential (primary) hypertension: Secondary | ICD-10-CM | POA: Diagnosis not present

## 2017-11-25 DIAGNOSIS — D631 Anemia in chronic kidney disease: Secondary | ICD-10-CM | POA: Diagnosis not present

## 2017-11-25 DIAGNOSIS — J09X2 Influenza due to identified novel influenza A virus with other respiratory manifestations: Secondary | ICD-10-CM | POA: Diagnosis not present

## 2017-11-25 DIAGNOSIS — Z6836 Body mass index (BMI) 36.0-36.9, adult: Secondary | ICD-10-CM | POA: Diagnosis not present

## 2017-11-25 DIAGNOSIS — H6122 Impacted cerumen, left ear: Secondary | ICD-10-CM | POA: Diagnosis not present

## 2017-11-25 DIAGNOSIS — R040 Epistaxis: Secondary | ICD-10-CM | POA: Diagnosis not present

## 2017-11-25 DIAGNOSIS — R509 Fever, unspecified: Secondary | ICD-10-CM | POA: Diagnosis not present

## 2017-11-25 DIAGNOSIS — R112 Nausea with vomiting, unspecified: Secondary | ICD-10-CM | POA: Diagnosis not present

## 2017-11-25 DIAGNOSIS — N183 Chronic kidney disease, stage 3 (moderate): Secondary | ICD-10-CM | POA: Diagnosis not present

## 2017-11-25 DIAGNOSIS — R1011 Right upper quadrant pain: Secondary | ICD-10-CM | POA: Diagnosis not present

## 2017-11-25 DIAGNOSIS — H9312 Tinnitus, left ear: Secondary | ICD-10-CM | POA: Diagnosis not present

## 2017-11-25 DIAGNOSIS — Z6829 Body mass index (BMI) 29.0-29.9, adult: Secondary | ICD-10-CM | POA: Diagnosis not present

## 2017-12-04 DIAGNOSIS — D509 Iron deficiency anemia, unspecified: Secondary | ICD-10-CM | POA: Diagnosis not present

## 2017-12-04 DIAGNOSIS — I10 Essential (primary) hypertension: Secondary | ICD-10-CM | POA: Diagnosis not present

## 2017-12-04 DIAGNOSIS — E1121 Type 2 diabetes mellitus with diabetic nephropathy: Secondary | ICD-10-CM | POA: Diagnosis not present

## 2017-12-04 DIAGNOSIS — N184 Chronic kidney disease, stage 4 (severe): Secondary | ICD-10-CM | POA: Diagnosis not present

## 2017-12-04 DIAGNOSIS — M069 Rheumatoid arthritis, unspecified: Secondary | ICD-10-CM | POA: Diagnosis not present

## 2018-01-19 DIAGNOSIS — I770 Arteriovenous fistula, acquired: Secondary | ICD-10-CM | POA: Diagnosis not present

## 2018-01-19 DIAGNOSIS — N183 Chronic kidney disease, stage 3 (moderate): Secondary | ICD-10-CM | POA: Diagnosis not present

## 2018-01-19 DIAGNOSIS — N2581 Secondary hyperparathyroidism of renal origin: Secondary | ICD-10-CM | POA: Diagnosis not present

## 2018-01-19 DIAGNOSIS — I129 Hypertensive chronic kidney disease with stage 1 through stage 4 chronic kidney disease, or unspecified chronic kidney disease: Secondary | ICD-10-CM | POA: Diagnosis not present

## 2018-01-19 MED FILL — CARTIA XT 240 MG CAPSULE: 240 | 30 days supply | Qty: 30 | Fill #0

## 2018-02-16 MED FILL — CARTIA XT 240 MG CAPSULE: 240 | 30 days supply | Qty: 30 | Fill #1

## 2018-02-26 DIAGNOSIS — R1011 Right upper quadrant pain: Secondary | ICD-10-CM | POA: Diagnosis not present

## 2018-03-06 DIAGNOSIS — Z7901 Long term (current) use of anticoagulants: Secondary | ICD-10-CM | POA: Diagnosis not present

## 2018-03-06 DIAGNOSIS — E119 Type 2 diabetes mellitus without complications: Secondary | ICD-10-CM | POA: Diagnosis not present

## 2018-03-09 DIAGNOSIS — N184 Chronic kidney disease, stage 4 (severe): Secondary | ICD-10-CM | POA: Diagnosis not present

## 2018-03-09 DIAGNOSIS — E1122 Type 2 diabetes mellitus with diabetic chronic kidney disease: Secondary | ICD-10-CM | POA: Diagnosis not present

## 2018-03-09 DIAGNOSIS — E782 Mixed hyperlipidemia: Secondary | ICD-10-CM | POA: Diagnosis not present

## 2018-03-09 DIAGNOSIS — I10 Essential (primary) hypertension: Secondary | ICD-10-CM | POA: Diagnosis not present

## 2018-03-09 DIAGNOSIS — D631 Anemia in chronic kidney disease: Secondary | ICD-10-CM | POA: Diagnosis not present

## 2018-04-22 IMAGING — DX DG ABDOMEN 1V
2 series · 2 of 2 positions shown · non-contrast
Comparison: CT from 05/21/2011

CLINICAL DATA: Nausea and vomiting and generalized abdominal pain
and cramping starting this evening.

EXAM:
ABDOMEN - 1 VIEW

[abdomen kub (1 of 2)]
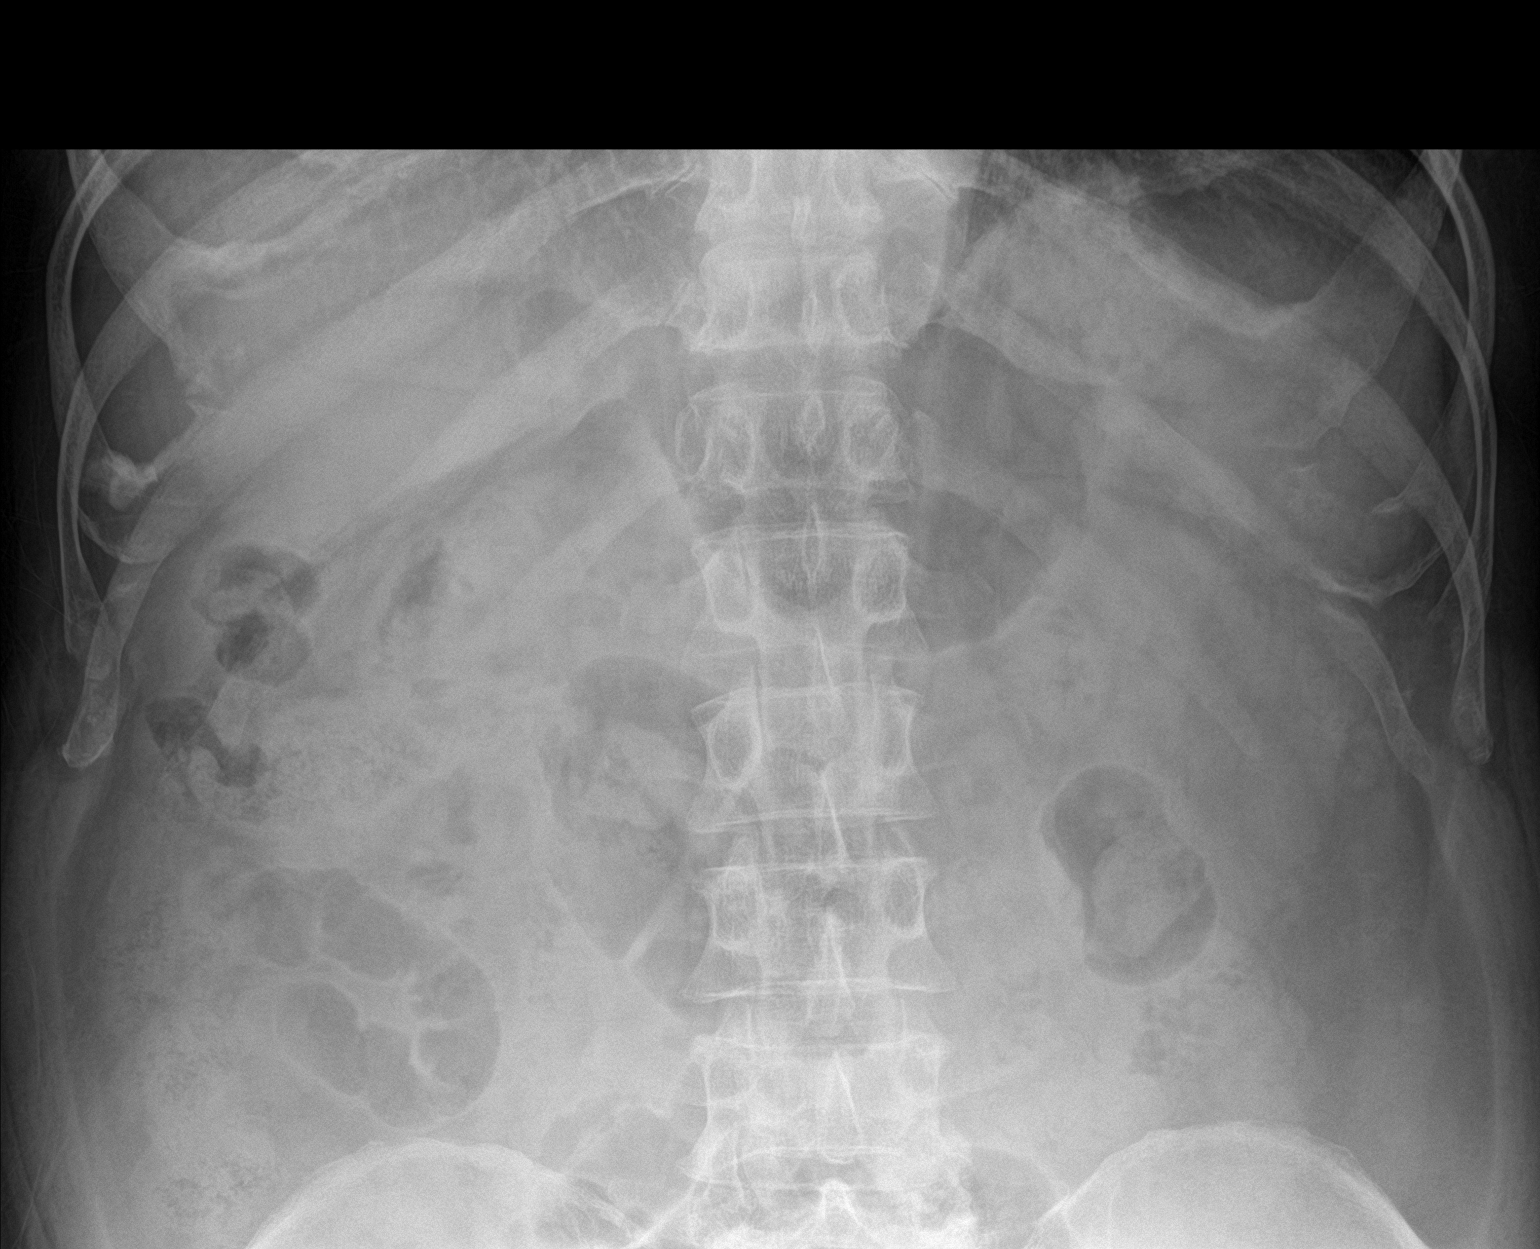

[abdomen kub (2 of 2)]
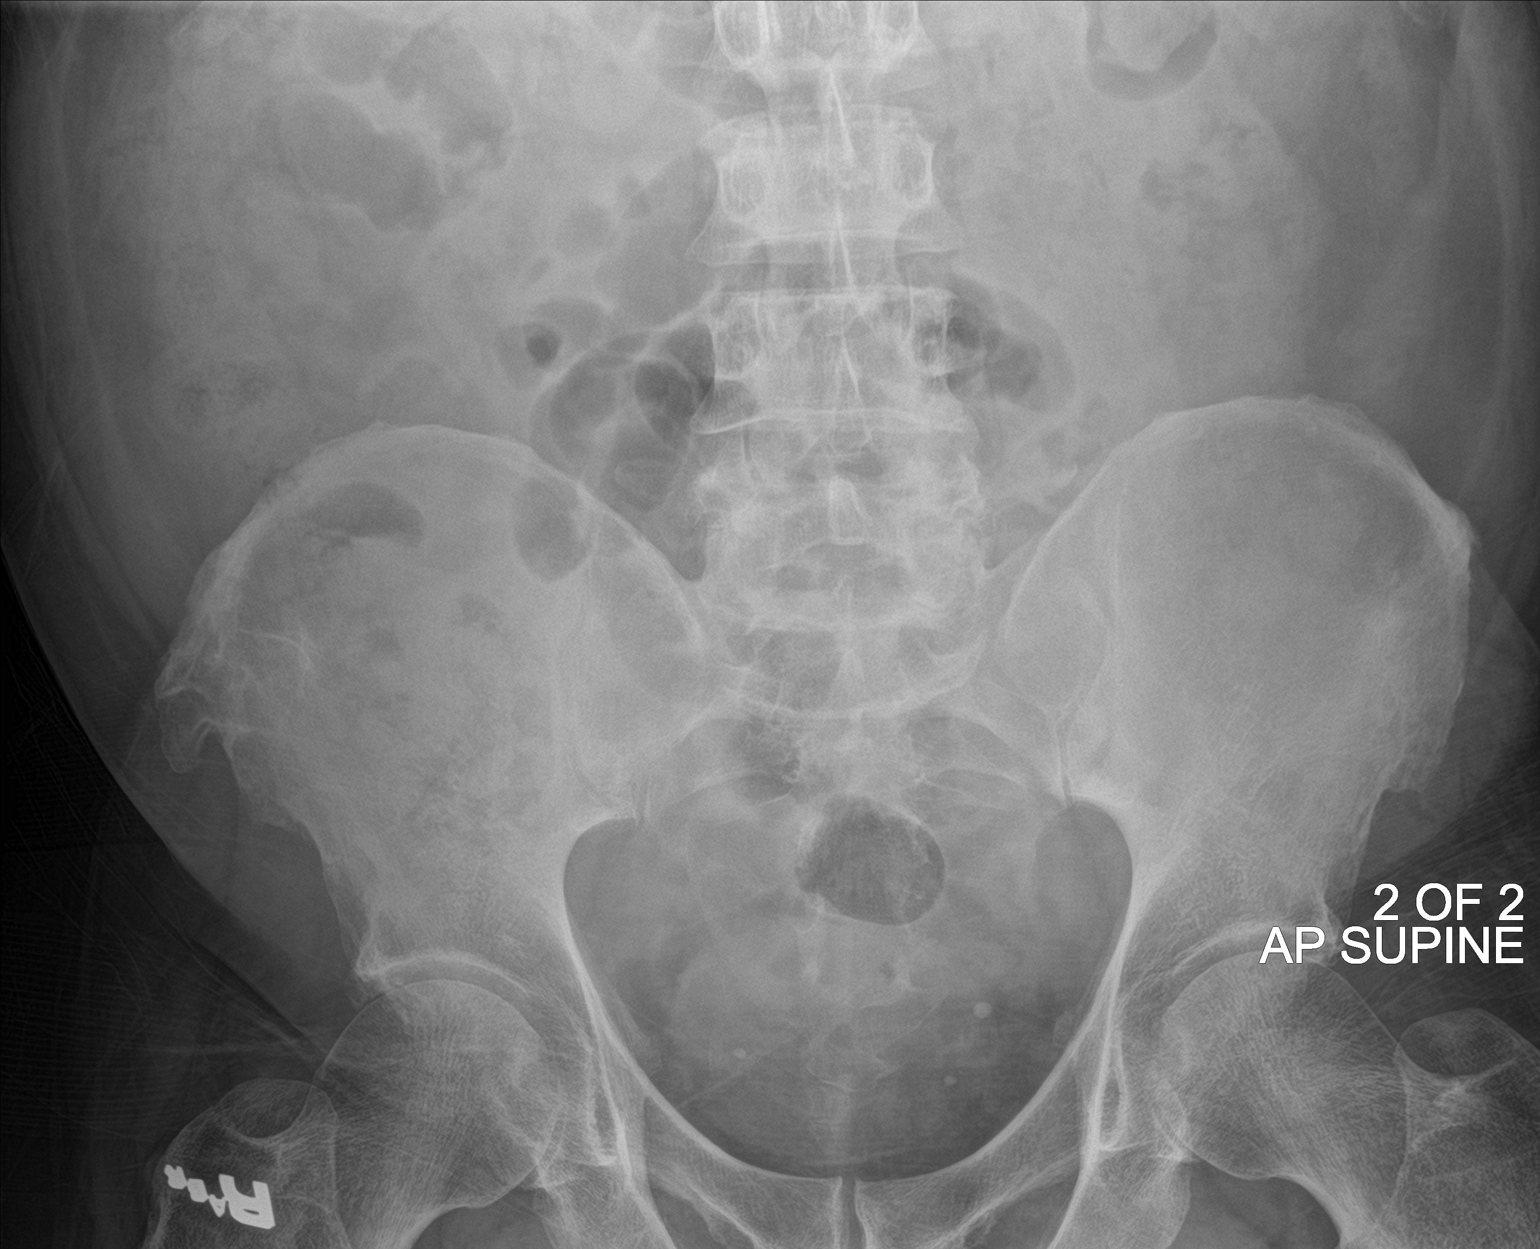

[2 of 2 positions shown; findings below may reference images not displayed]

FINDINGS: Moderate colonic stool burden within large bowel. No bowel
obstruction or free air. No organomegaly. Phleboliths are seen in
the pelvis. L4-5 and L5-S1 facet arthropathy.
IMPRESSION: Moderate colonic stool burden.  No bowel obstruction.

## 2018-04-29 IMAGING — CR DG CHEST 1V PORT
1 series · 1 of 1 positions shown · non-contrast
Comparison: Radiograph August 07, 2017.

CLINICAL DATA: Shortness of breath, wheezing.

EXAM:
PORTABLE CHEST 1 VIEW

[AP]
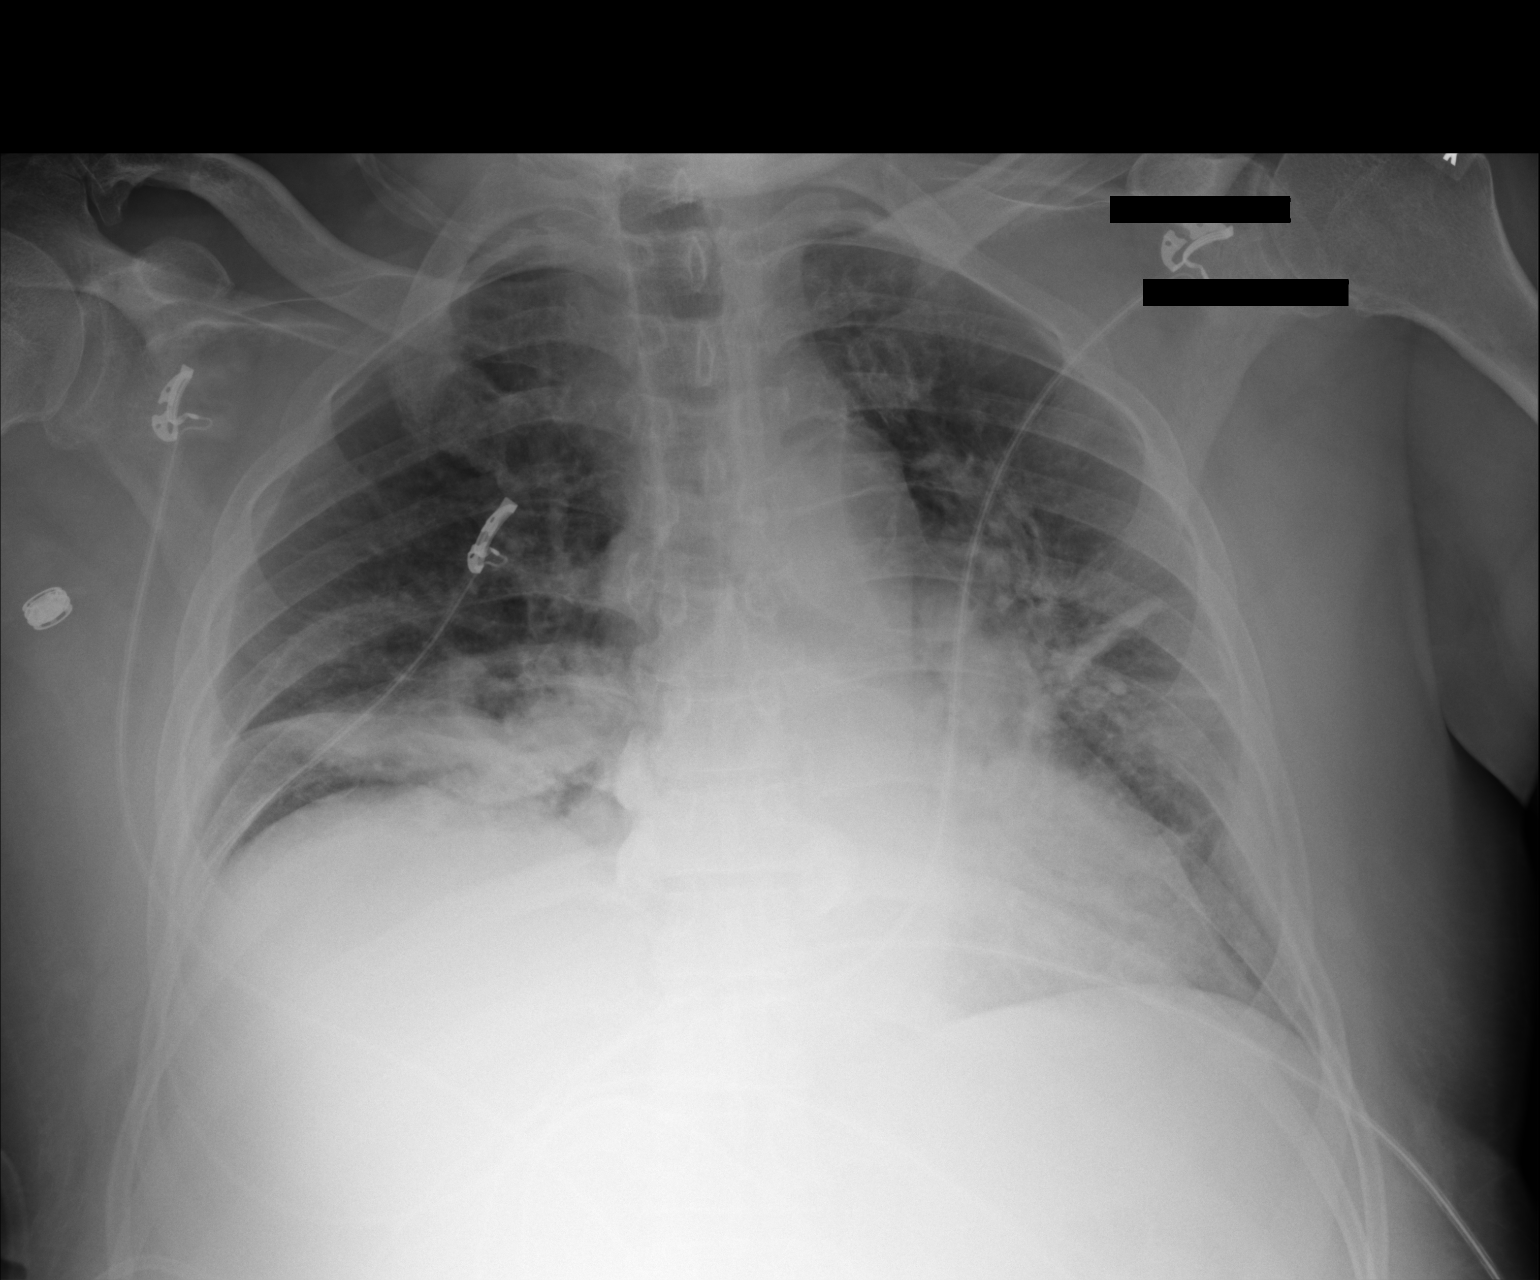

[1 of 1 positions shown; findings below may reference images not displayed]

FINDINGS: Stable cardiomediastinal silhouette. No pneumothorax is noted.
Stable left midlung atelectasis is noted. Stable right basilar
opacity is noted concerning for atelectasis or infiltrate. Bony
thorax is unremarkable.
IMPRESSION: Stable left midlung and right basilar opacities are noted concerning
for atelectasis or possibly infiltrate.

## 2018-05-01 IMAGING — CR DG CHEST 1V PORT
1 series · 1 of 1 positions shown · non-contrast
Comparison: 09/09/2016

CLINICAL DATA: Onset epigastric pain today.  Wheezing.

EXAM:
PORTABLE CHEST 1 VIEW

[portable]
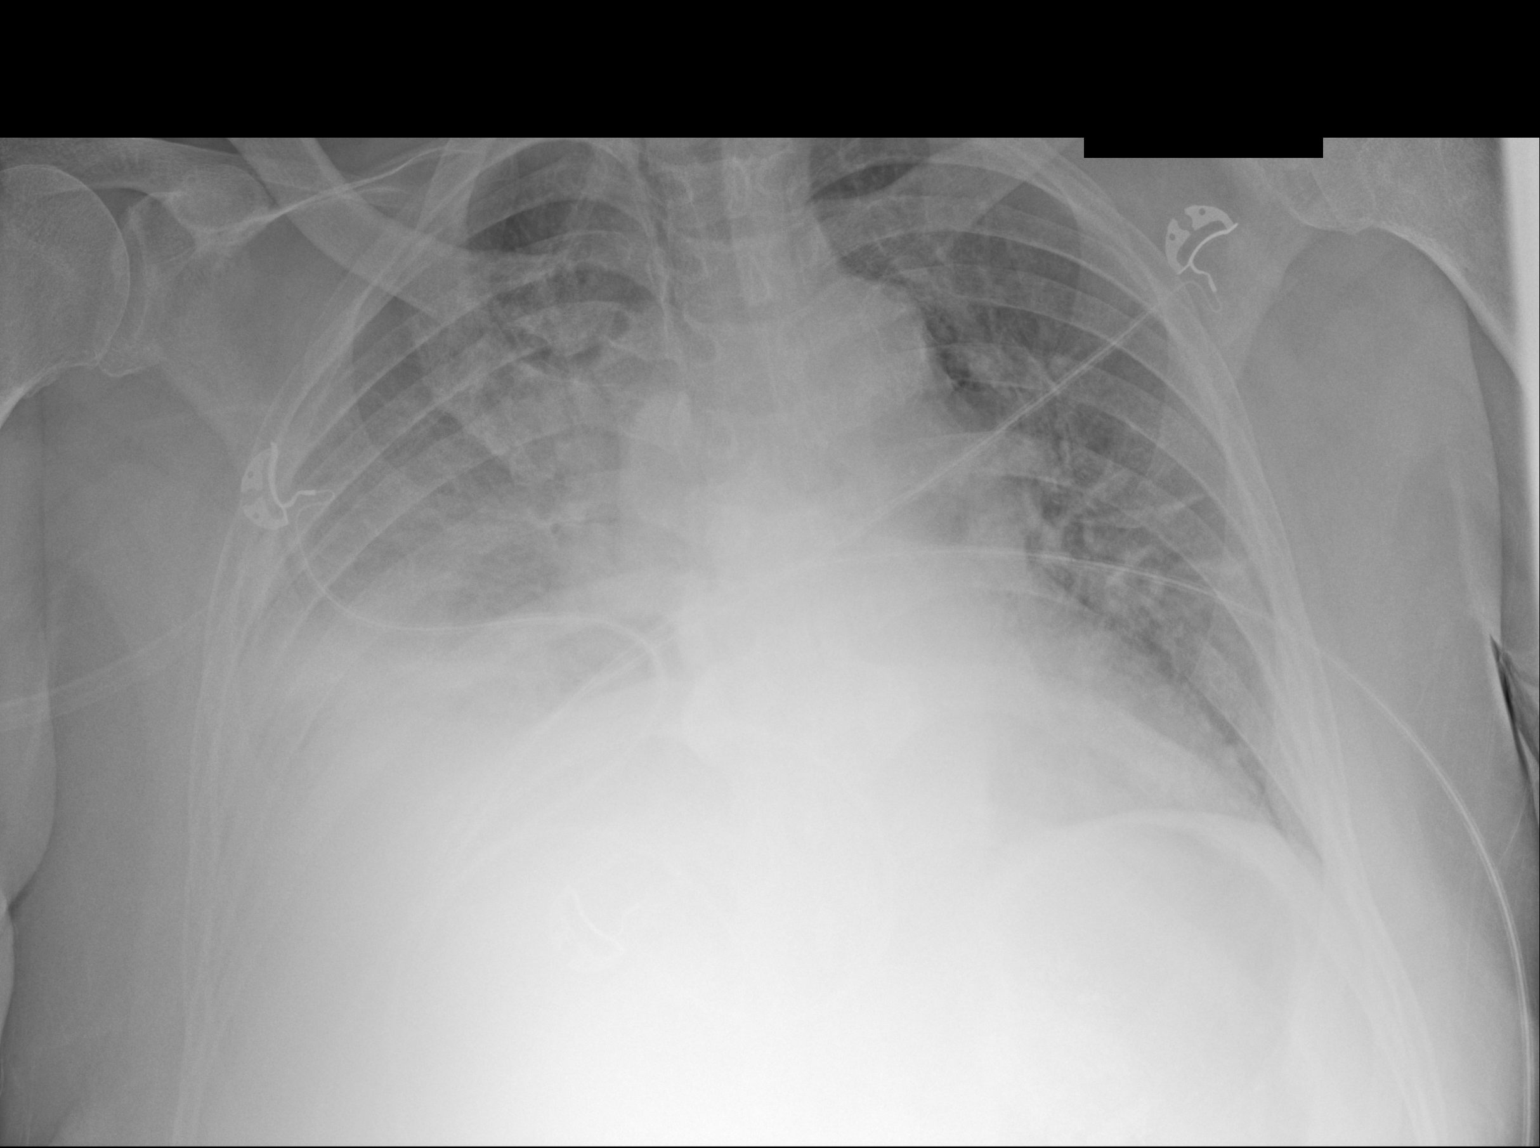

[1 of 1 positions shown; findings below may reference images not displayed]

FINDINGS: Diffuse bilateral airspace disease, right greater than left, most
confluent in the right lower lung. While this could reflect edema,
cannot exclude pneumonia. Heart is borderline in size. Probable
right effusion.
IMPRESSION: Bilateral airspace disease, right greater than left, most confluent
in the right base. Edema versus infection.

## 2018-05-02 IMAGING — CR DG CHEST 1V PORT
1 series · 1 of 1 positions shown · non-contrast
Comparison: 09/11/2016 .

CLINICAL DATA: Respiratory failure.

EXAM:
PORTABLE CHEST 1 VIEW

[AP]
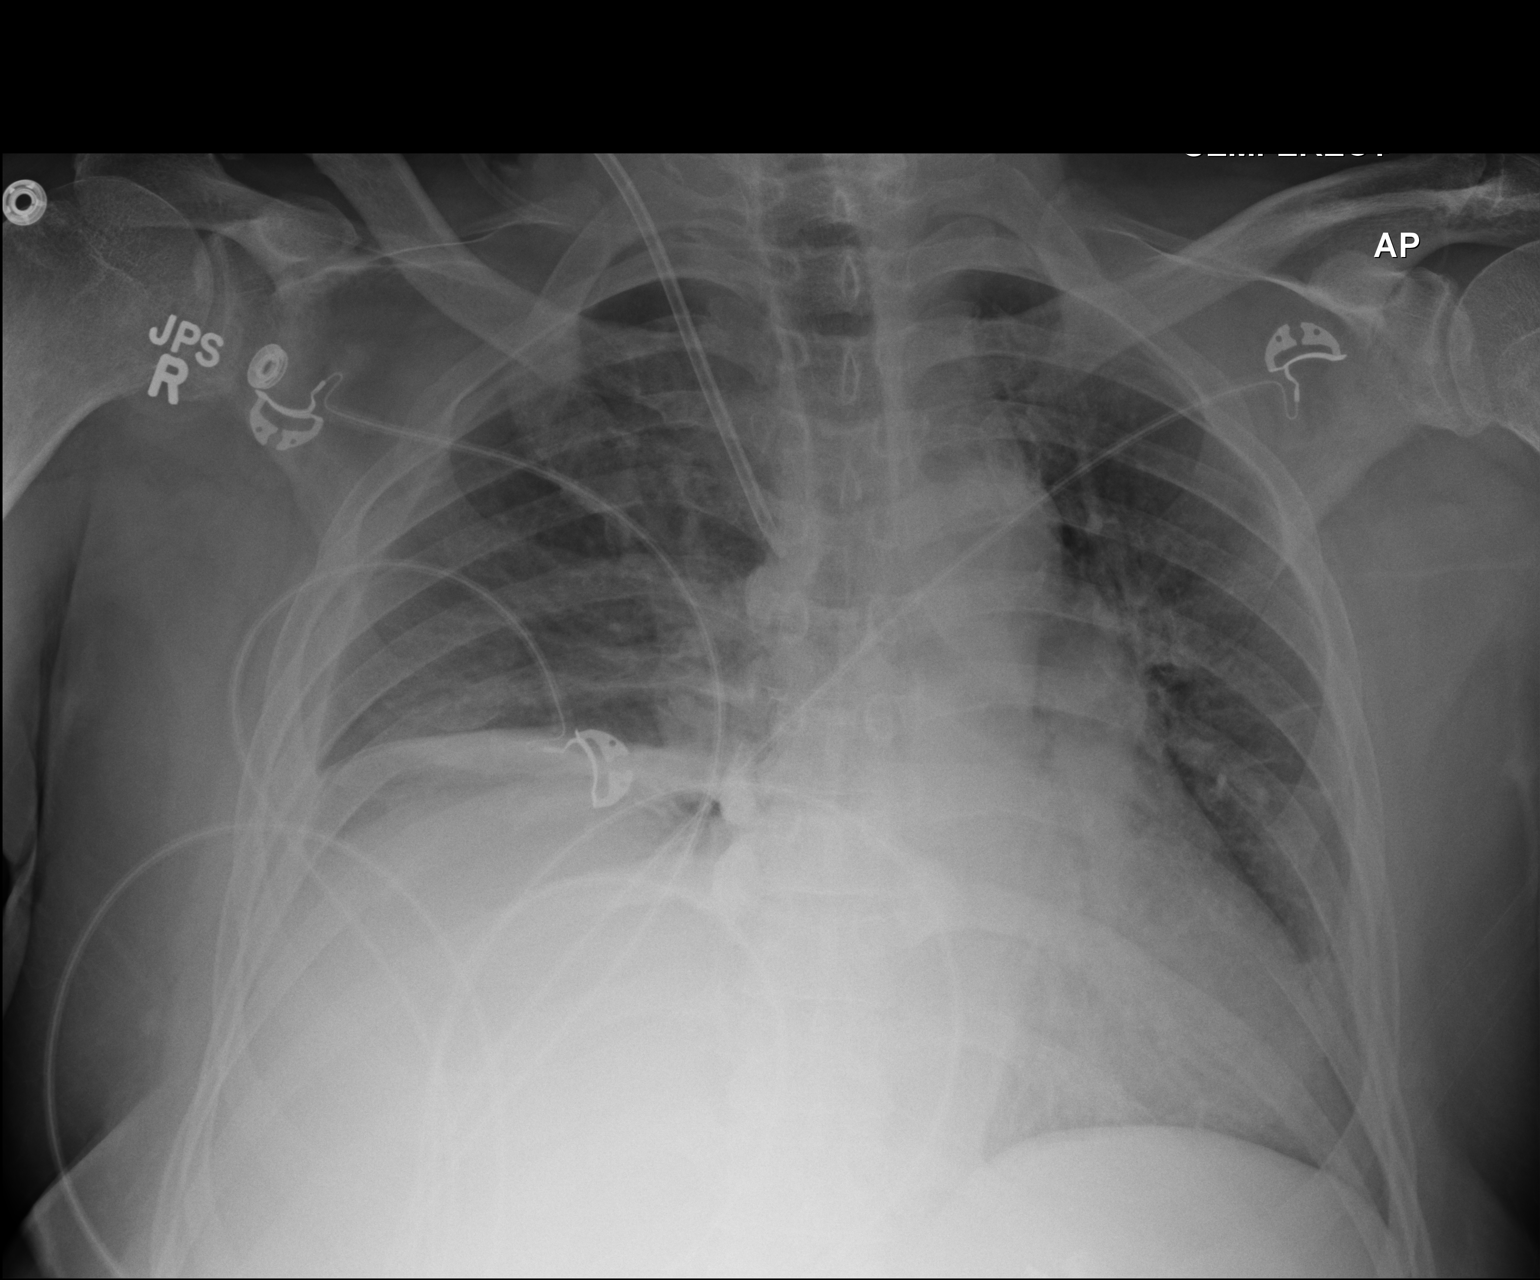

[1 of 1 positions shown; findings below may reference images not displayed]

FINDINGS: Right IJ line in stable position. Stable cardiomegaly. Improved
pulmonary interstitial edema. Persistent right base atelectasis.
Improved left mid lung field subsegmental atelectasis. Small right
pleural effusion . No pneumothorax.
IMPRESSION: 1. Right IJ line stable position.

2. Stable mild cardiomegaly. Improvement of pulmonary interstitial
edema. Small right pleural effusion.

3. Persistent right base atelectasis. Improvement of left mid lung
field atelectasis .

## 2018-05-05 DIAGNOSIS — R0981 Nasal congestion: Secondary | ICD-10-CM | POA: Diagnosis not present

## 2018-05-05 DIAGNOSIS — J060 Acute laryngopharyngitis: Secondary | ICD-10-CM | POA: Diagnosis not present

## 2018-05-05 DIAGNOSIS — R05 Cough: Secondary | ICD-10-CM | POA: Diagnosis not present

## 2018-06-06 DIAGNOSIS — Z23 Encounter for immunization: Secondary | ICD-10-CM | POA: Diagnosis not present

## 2018-07-15 ENCOUNTER — Other Ambulatory Visit: Payer: Self-pay

## 2018-07-15 NOTE — Patient Outreach (Signed)
[facility]) Care Management  07/15/2018  Ruben Mahler Mcdill Sr. 05-01-1951 [id number]   Medication Adherence call to Mr. Kashon Elsayed spoke with patient he is no longer taking Atorvastatin 40 mg he said doctor took him off. Mr. Hilliker is showing past due under [place].   [place] Management Direct Dial [phone]  Fax [phone] [email or web address]

## 2018-07-20 DIAGNOSIS — N183 Chronic kidney disease, stage 3 (moderate): Secondary | ICD-10-CM | POA: Diagnosis not present

## 2018-07-20 DIAGNOSIS — E782 Mixed hyperlipidemia: Secondary | ICD-10-CM | POA: Diagnosis not present

## 2018-07-20 DIAGNOSIS — E1122 Type 2 diabetes mellitus with diabetic chronic kidney disease: Secondary | ICD-10-CM | POA: Diagnosis not present

## 2018-07-20 DIAGNOSIS — I10 Essential (primary) hypertension: Secondary | ICD-10-CM | POA: Diagnosis not present

## 2018-07-20 DIAGNOSIS — E785 Hyperlipidemia, unspecified: Secondary | ICD-10-CM | POA: Diagnosis not present

## 2018-08-11 DIAGNOSIS — Z0000 Encounter for general adult medical examination without abnormal findings: Secondary | ICD-10-CM | POA: Diagnosis not present

## 2018-08-11 DIAGNOSIS — I10 Essential (primary) hypertension: Secondary | ICD-10-CM | POA: Diagnosis not present

## 2018-08-11 DIAGNOSIS — N185 Chronic kidney disease, stage 5: Secondary | ICD-10-CM | POA: Diagnosis not present

## 2018-08-11 DIAGNOSIS — E1122 Type 2 diabetes mellitus with diabetic chronic kidney disease: Secondary | ICD-10-CM | POA: Diagnosis not present

## 2018-08-11 DIAGNOSIS — E1140 Type 2 diabetes mellitus with diabetic neuropathy, unspecified: Secondary | ICD-10-CM | POA: Diagnosis not present

## 2018-08-12 DIAGNOSIS — D631 Anemia in chronic kidney disease: Secondary | ICD-10-CM | POA: Diagnosis not present

## 2018-08-12 DIAGNOSIS — N2581 Secondary hyperparathyroidism of renal origin: Secondary | ICD-10-CM | POA: Diagnosis not present

## 2018-08-12 DIAGNOSIS — I129 Hypertensive chronic kidney disease with stage 1 through stage 4 chronic kidney disease, or unspecified chronic kidney disease: Secondary | ICD-10-CM | POA: Diagnosis not present

## 2018-08-12 DIAGNOSIS — N189 Chronic kidney disease, unspecified: Secondary | ICD-10-CM | POA: Diagnosis not present

## 2018-08-12 DIAGNOSIS — N183 Chronic kidney disease, stage 3 (moderate): Secondary | ICD-10-CM | POA: Diagnosis not present

## 2018-08-12 DIAGNOSIS — N051 Unspecified nephritic syndrome with focal and segmental glomerular lesions: Secondary | ICD-10-CM | POA: Diagnosis not present

## 2018-09-12 DIAGNOSIS — R10813 Right lower quadrant abdominal tenderness: Secondary | ICD-10-CM | POA: Diagnosis not present

## 2018-09-12 DIAGNOSIS — R2231 Localized swelling, mass and lump, right upper limb: Secondary | ICD-10-CM | POA: Diagnosis not present

## 2018-12-21 DIAGNOSIS — E1122 Type 2 diabetes mellitus with diabetic chronic kidney disease: Secondary | ICD-10-CM | POA: Diagnosis not present

## 2018-12-21 DIAGNOSIS — E782 Mixed hyperlipidemia: Secondary | ICD-10-CM | POA: Diagnosis not present

## 2018-12-21 DIAGNOSIS — D631 Anemia in chronic kidney disease: Secondary | ICD-10-CM | POA: Diagnosis not present

## 2018-12-21 DIAGNOSIS — I10 Essential (primary) hypertension: Secondary | ICD-10-CM | POA: Diagnosis not present

## 2018-12-21 DIAGNOSIS — E785 Hyperlipidemia, unspecified: Secondary | ICD-10-CM | POA: Diagnosis not present

## 2018-12-24 DIAGNOSIS — E782 Mixed hyperlipidemia: Secondary | ICD-10-CM | POA: Diagnosis not present

## 2018-12-24 DIAGNOSIS — I10 Essential (primary) hypertension: Secondary | ICD-10-CM | POA: Diagnosis not present

## 2018-12-24 DIAGNOSIS — E1122 Type 2 diabetes mellitus with diabetic chronic kidney disease: Secondary | ICD-10-CM | POA: Diagnosis not present

## 2018-12-24 DIAGNOSIS — N184 Chronic kidney disease, stage 4 (severe): Secondary | ICD-10-CM | POA: Diagnosis not present

## 2018-12-24 DIAGNOSIS — M059 Rheumatoid arthritis with rheumatoid factor, unspecified: Secondary | ICD-10-CM | POA: Diagnosis not present

## 2019-01-07 DIAGNOSIS — Z0000 Encounter for general adult medical examination without abnormal findings: Secondary | ICD-10-CM | POA: Diagnosis not present

## 2019-02-16 DIAGNOSIS — N183 Chronic kidney disease, stage 3 (moderate): Secondary | ICD-10-CM | POA: Diagnosis not present

## 2019-02-26 DIAGNOSIS — D631 Anemia in chronic kidney disease: Secondary | ICD-10-CM | POA: Diagnosis not present

## 2019-02-26 DIAGNOSIS — N183 Chronic kidney disease, stage 3 (moderate): Secondary | ICD-10-CM | POA: Diagnosis not present

## 2019-02-26 DIAGNOSIS — N051 Unspecified nephritic syndrome with focal and segmental glomerular lesions: Secondary | ICD-10-CM | POA: Diagnosis not present

## 2019-02-26 DIAGNOSIS — I129 Hypertensive chronic kidney disease with stage 1 through stage 4 chronic kidney disease, or unspecified chronic kidney disease: Secondary | ICD-10-CM | POA: Diagnosis not present

## 2019-02-26 DIAGNOSIS — N2581 Secondary hyperparathyroidism of renal origin: Secondary | ICD-10-CM | POA: Diagnosis not present

## 2019-02-26 MED FILL — LISINOPRIL 5 MG TABLET: 5 | 90 days supply | Qty: 90 | Fill #0

## 2019-02-26 MED FILL — SODIUM BICARB 10 GRAIN TAB: 650 | 90 days supply | Qty: 90 | Fill #0

## 2019-03-23 DIAGNOSIS — D631 Anemia in chronic kidney disease: Secondary | ICD-10-CM | POA: Diagnosis not present

## 2019-03-23 DIAGNOSIS — I10 Essential (primary) hypertension: Secondary | ICD-10-CM | POA: Diagnosis not present

## 2019-03-23 DIAGNOSIS — E1122 Type 2 diabetes mellitus with diabetic chronic kidney disease: Secondary | ICD-10-CM | POA: Diagnosis not present

## 2019-03-23 DIAGNOSIS — R739 Hyperglycemia, unspecified: Secondary | ICD-10-CM | POA: Diagnosis not present

## 2019-03-23 DIAGNOSIS — E782 Mixed hyperlipidemia: Secondary | ICD-10-CM | POA: Diagnosis not present

## 2019-03-29 DIAGNOSIS — I10 Essential (primary) hypertension: Secondary | ICD-10-CM | POA: Diagnosis not present

## 2019-03-29 DIAGNOSIS — E1122 Type 2 diabetes mellitus with diabetic chronic kidney disease: Secondary | ICD-10-CM | POA: Diagnosis not present

## 2019-03-29 DIAGNOSIS — E782 Mixed hyperlipidemia: Secondary | ICD-10-CM | POA: Diagnosis not present

## 2019-03-29 DIAGNOSIS — M059 Rheumatoid arthritis with rheumatoid factor, unspecified: Secondary | ICD-10-CM | POA: Diagnosis not present

## 2019-03-29 DIAGNOSIS — N184 Chronic kidney disease, stage 4 (severe): Secondary | ICD-10-CM | POA: Diagnosis not present

## 2019-06-01 ENCOUNTER — Other Ambulatory Visit: Payer: Self-pay

## 2019-06-01 NOTE — Patient Outreach (Signed)
[place] [facility]) Care Management  06/01/2019  Gary Lang Sr. 1951/05/18 [id number]   Medication Adherence call to [name] patient did not answer patient is past due on Lisinopril 5 mg under [place].   [place] Management Direct Dial [phone]  Fax [phone] [email or web address]

## 2019-06-14 ENCOUNTER — Other Ambulatory Visit: Payer: Self-pay

## 2019-06-14 NOTE — Patient Outreach (Signed)
[place] [facility]) Care Management  06/14/2019  Gary TERNES Sr. 1950/10/03 [id number]   Medication Adherence call to Gary [place] Telephone call to Patient regarding Medication Adherence unable to reach patient. [name] is showing past due on Lisinopril 5 mg under [place].  [place] Management Direct Dial [phone]  Fax [phone] [email or web address]

## 2019-07-07 DIAGNOSIS — H2513 Age-related nuclear cataract, bilateral: Secondary | ICD-10-CM | POA: Diagnosis not present

## 2019-07-07 DIAGNOSIS — H5203 Hypermetropia, bilateral: Secondary | ICD-10-CM | POA: Diagnosis not present

## 2019-07-07 DIAGNOSIS — E109 Type 1 diabetes mellitus without complications: Secondary | ICD-10-CM | POA: Diagnosis not present

## 2019-07-07 DIAGNOSIS — H524 Presbyopia: Secondary | ICD-10-CM | POA: Diagnosis not present

## 2019-07-07 DIAGNOSIS — H52223 Regular astigmatism, bilateral: Secondary | ICD-10-CM | POA: Diagnosis not present

## 2019-07-19 DIAGNOSIS — D631 Anemia in chronic kidney disease: Secondary | ICD-10-CM | POA: Diagnosis not present

## 2019-07-19 DIAGNOSIS — I10 Essential (primary) hypertension: Secondary | ICD-10-CM | POA: Diagnosis not present

## 2019-07-19 DIAGNOSIS — N183 Chronic kidney disease, stage 3 unspecified: Secondary | ICD-10-CM | POA: Diagnosis not present

## 2019-07-19 DIAGNOSIS — E1122 Type 2 diabetes mellitus with diabetic chronic kidney disease: Secondary | ICD-10-CM | POA: Diagnosis not present

## 2019-07-19 DIAGNOSIS — E782 Mixed hyperlipidemia: Secondary | ICD-10-CM | POA: Diagnosis not present

## 2019-07-20 DIAGNOSIS — Z23 Encounter for immunization: Secondary | ICD-10-CM | POA: Diagnosis not present

## 2019-07-22 DIAGNOSIS — I10 Essential (primary) hypertension: Secondary | ICD-10-CM | POA: Diagnosis not present

## 2019-07-22 DIAGNOSIS — M059 Rheumatoid arthritis with rheumatoid factor, unspecified: Secondary | ICD-10-CM | POA: Diagnosis not present

## 2019-07-22 DIAGNOSIS — N184 Chronic kidney disease, stage 4 (severe): Secondary | ICD-10-CM | POA: Diagnosis not present

## 2019-07-22 DIAGNOSIS — E1122 Type 2 diabetes mellitus with diabetic chronic kidney disease: Secondary | ICD-10-CM | POA: Diagnosis not present

## 2019-07-22 DIAGNOSIS — E782 Mixed hyperlipidemia: Secondary | ICD-10-CM | POA: Diagnosis not present

## 2019-09-09 ENCOUNTER — Ambulatory Visit: Payer: Medicare Other | Attending: Internal Medicine

## 2019-09-09 ENCOUNTER — Other Ambulatory Visit: Payer: Self-pay

## 2019-09-09 DIAGNOSIS — Z20822 Contact with and (suspected) exposure to covid-19: Secondary | ICD-10-CM

## 2019-09-10 LAB — NOVEL CORONAVIRUS, NAA: SARS-CoV-2, NAA: NOT DETECTED

## 2019-09-12 ENCOUNTER — Telehealth: Payer: Self-pay

## 2019-09-12 NOTE — Telephone Encounter (Signed)

## 2019-10-13 DIAGNOSIS — E785 Hyperlipidemia, unspecified: Secondary | ICD-10-CM | POA: Diagnosis not present

## 2019-10-13 DIAGNOSIS — E1122 Type 2 diabetes mellitus with diabetic chronic kidney disease: Secondary | ICD-10-CM | POA: Diagnosis not present

## 2019-10-13 DIAGNOSIS — I10 Essential (primary) hypertension: Secondary | ICD-10-CM | POA: Diagnosis not present

## 2019-10-13 DIAGNOSIS — E782 Mixed hyperlipidemia: Secondary | ICD-10-CM | POA: Diagnosis not present

## 2019-10-13 DIAGNOSIS — E1121 Type 2 diabetes mellitus with diabetic nephropathy: Secondary | ICD-10-CM | POA: Diagnosis not present

## 2019-10-21 DIAGNOSIS — E1122 Type 2 diabetes mellitus with diabetic chronic kidney disease: Secondary | ICD-10-CM | POA: Diagnosis not present

## 2019-10-21 DIAGNOSIS — N184 Chronic kidney disease, stage 4 (severe): Secondary | ICD-10-CM | POA: Diagnosis not present

## 2019-10-21 DIAGNOSIS — M059 Rheumatoid arthritis with rheumatoid factor, unspecified: Secondary | ICD-10-CM | POA: Diagnosis not present

## 2019-10-21 DIAGNOSIS — E782 Mixed hyperlipidemia: Secondary | ICD-10-CM | POA: Diagnosis not present

## 2019-10-21 DIAGNOSIS — I10 Essential (primary) hypertension: Secondary | ICD-10-CM | POA: Diagnosis not present

## 2020-04-04 DIAGNOSIS — N2581 Secondary hyperparathyroidism of renal origin: Secondary | ICD-10-CM | POA: Diagnosis not present

## 2020-04-04 DIAGNOSIS — I129 Hypertensive chronic kidney disease with stage 1 through stage 4 chronic kidney disease, or unspecified chronic kidney disease: Secondary | ICD-10-CM | POA: Diagnosis not present

## 2020-04-04 DIAGNOSIS — M109 Gout, unspecified: Secondary | ICD-10-CM | POA: Diagnosis not present

## 2020-04-04 DIAGNOSIS — N051 Unspecified nephritic syndrome with focal and segmental glomerular lesions: Secondary | ICD-10-CM | POA: Diagnosis not present

## 2020-04-04 DIAGNOSIS — D631 Anemia in chronic kidney disease: Secondary | ICD-10-CM | POA: Diagnosis not present

## 2020-04-04 DIAGNOSIS — N183 Chronic kidney disease, stage 3 unspecified: Secondary | ICD-10-CM | POA: Diagnosis not present

## 2020-04-04 DIAGNOSIS — N189 Chronic kidney disease, unspecified: Secondary | ICD-10-CM | POA: Diagnosis not present

## 2020-04-10 DIAGNOSIS — E1121 Type 2 diabetes mellitus with diabetic nephropathy: Secondary | ICD-10-CM | POA: Diagnosis not present

## 2020-04-10 DIAGNOSIS — E1122 Type 2 diabetes mellitus with diabetic chronic kidney disease: Secondary | ICD-10-CM | POA: Diagnosis not present

## 2020-04-10 DIAGNOSIS — I10 Essential (primary) hypertension: Secondary | ICD-10-CM | POA: Diagnosis not present

## 2020-04-10 DIAGNOSIS — E782 Mixed hyperlipidemia: Secondary | ICD-10-CM | POA: Diagnosis not present

## 2020-04-10 DIAGNOSIS — E785 Hyperlipidemia, unspecified: Secondary | ICD-10-CM | POA: Diagnosis not present

## 2020-04-13 DIAGNOSIS — E1122 Type 2 diabetes mellitus with diabetic chronic kidney disease: Secondary | ICD-10-CM | POA: Diagnosis not present

## 2020-04-13 DIAGNOSIS — M059 Rheumatoid arthritis with rheumatoid factor, unspecified: Secondary | ICD-10-CM | POA: Diagnosis not present

## 2020-04-13 DIAGNOSIS — I10 Essential (primary) hypertension: Secondary | ICD-10-CM | POA: Diagnosis not present

## 2020-04-13 DIAGNOSIS — Z0001 Encounter for general adult medical examination with abnormal findings: Secondary | ICD-10-CM | POA: Diagnosis not present

## 2020-04-13 DIAGNOSIS — E782 Mixed hyperlipidemia: Secondary | ICD-10-CM | POA: Diagnosis not present

## 2020-07-07 DIAGNOSIS — E1122 Type 2 diabetes mellitus with diabetic chronic kidney disease: Secondary | ICD-10-CM | POA: Diagnosis not present

## 2020-07-07 DIAGNOSIS — R634 Abnormal weight loss: Secondary | ICD-10-CM | POA: Diagnosis not present

## 2020-07-07 DIAGNOSIS — Z0000 Encounter for general adult medical examination without abnormal findings: Secondary | ICD-10-CM | POA: Diagnosis not present

## 2020-07-07 DIAGNOSIS — R739 Hyperglycemia, unspecified: Secondary | ICD-10-CM | POA: Diagnosis not present

## 2020-07-07 DIAGNOSIS — R112 Nausea with vomiting, unspecified: Secondary | ICD-10-CM | POA: Diagnosis not present

## 2020-07-07 DIAGNOSIS — D464 Refractory anemia, unspecified: Secondary | ICD-10-CM | POA: Diagnosis not present

## 2020-07-26 DIAGNOSIS — N184 Chronic kidney disease, stage 4 (severe): Secondary | ICD-10-CM | POA: Diagnosis not present

## 2020-07-26 DIAGNOSIS — E782 Mixed hyperlipidemia: Secondary | ICD-10-CM | POA: Diagnosis not present

## 2020-07-26 DIAGNOSIS — E1122 Type 2 diabetes mellitus with diabetic chronic kidney disease: Secondary | ICD-10-CM | POA: Diagnosis not present

## 2020-07-26 DIAGNOSIS — I10 Essential (primary) hypertension: Secondary | ICD-10-CM | POA: Diagnosis not present

## 2020-07-26 DIAGNOSIS — M059 Rheumatoid arthritis with rheumatoid factor, unspecified: Secondary | ICD-10-CM | POA: Diagnosis not present

## 2020-09-06 DIAGNOSIS — Z23 Encounter for immunization: Secondary | ICD-10-CM | POA: Diagnosis not present

## 2020-10-27 DIAGNOSIS — I129 Hypertensive chronic kidney disease with stage 1 through stage 4 chronic kidney disease, or unspecified chronic kidney disease: Secondary | ICD-10-CM | POA: Diagnosis not present

## 2020-10-27 DIAGNOSIS — N2581 Secondary hyperparathyroidism of renal origin: Secondary | ICD-10-CM | POA: Diagnosis not present

## 2020-10-27 DIAGNOSIS — N1831 Chronic kidney disease, stage 3a: Secondary | ICD-10-CM | POA: Diagnosis not present

## 2020-10-27 DIAGNOSIS — N189 Chronic kidney disease, unspecified: Secondary | ICD-10-CM | POA: Diagnosis not present

## 2020-10-27 DIAGNOSIS — I770 Arteriovenous fistula, acquired: Secondary | ICD-10-CM | POA: Diagnosis not present

## 2020-10-27 DIAGNOSIS — D631 Anemia in chronic kidney disease: Secondary | ICD-10-CM | POA: Diagnosis not present

## 2020-10-27 DIAGNOSIS — N051 Unspecified nephritic syndrome with focal and segmental glomerular lesions: Secondary | ICD-10-CM | POA: Diagnosis not present

## 2020-10-27 DIAGNOSIS — E872 Acidosis: Secondary | ICD-10-CM | POA: Diagnosis not present

## 2020-11-06 DIAGNOSIS — I10 Essential (primary) hypertension: Secondary | ICD-10-CM | POA: Diagnosis not present

## 2020-11-10 DIAGNOSIS — D72829 Elevated white blood cell count, unspecified: Secondary | ICD-10-CM | POA: Diagnosis not present

## 2020-11-10 DIAGNOSIS — N184 Chronic kidney disease, stage 4 (severe): Secondary | ICD-10-CM | POA: Diagnosis not present

## 2020-11-10 DIAGNOSIS — I480 Paroxysmal atrial fibrillation: Secondary | ICD-10-CM | POA: Diagnosis not present

## 2020-11-10 DIAGNOSIS — R634 Abnormal weight loss: Secondary | ICD-10-CM | POA: Diagnosis not present

## 2020-11-10 DIAGNOSIS — M059 Rheumatoid arthritis with rheumatoid factor, unspecified: Secondary | ICD-10-CM | POA: Diagnosis not present

## 2020-11-10 DIAGNOSIS — D631 Anemia in chronic kidney disease: Secondary | ICD-10-CM | POA: Diagnosis not present

## 2020-11-10 DIAGNOSIS — I10 Essential (primary) hypertension: Secondary | ICD-10-CM | POA: Diagnosis not present

## 2020-11-10 DIAGNOSIS — E782 Mixed hyperlipidemia: Secondary | ICD-10-CM | POA: Diagnosis not present

## 2020-11-10 DIAGNOSIS — E1122 Type 2 diabetes mellitus with diabetic chronic kidney disease: Secondary | ICD-10-CM | POA: Diagnosis not present

## 2020-11-14 ENCOUNTER — Emergency Department (HOSPITAL_COMMUNITY)
Admission: EM | Admit: 2020-11-14 | Discharge: 2020-11-14 | Disposition: A | Discharge status: Home / Self Care | Payer: Medicare Other | Attending: Emergency Medicine | Admitting: Emergency Medicine

## 2020-11-14 ENCOUNTER — Encounter (HOSPITAL_COMMUNITY): Payer: Self-pay | Admitting: Emergency Medicine

## 2020-11-14 ENCOUNTER — Other Ambulatory Visit: Payer: Self-pay

## 2020-11-14 DIAGNOSIS — H7392 Unspecified disorder of tympanic membrane, left ear: Secondary | ICD-10-CM | POA: Insufficient documentation

## 2020-11-14 DIAGNOSIS — H73899 Other specified disorders of tympanic membrane, unspecified ear: Secondary | ICD-10-CM

## 2020-11-14 DIAGNOSIS — Z794 Long term (current) use of insulin: Secondary | ICD-10-CM | POA: Diagnosis not present

## 2020-11-14 DIAGNOSIS — E1122 Type 2 diabetes mellitus with diabetic chronic kidney disease: Secondary | ICD-10-CM | POA: Insufficient documentation

## 2020-11-14 DIAGNOSIS — I120 Hypertensive chronic kidney disease with stage 5 chronic kidney disease or end stage renal disease: Secondary | ICD-10-CM | POA: Diagnosis not present

## 2020-11-14 DIAGNOSIS — Z79899 Other long term (current) drug therapy: Secondary | ICD-10-CM | POA: Insufficient documentation

## 2020-11-14 DIAGNOSIS — H9202 Otalgia, left ear: Secondary | ICD-10-CM

## 2020-11-14 DIAGNOSIS — Z87891 Personal history of nicotine dependence: Secondary | ICD-10-CM | POA: Insufficient documentation

## 2020-11-14 DIAGNOSIS — I10 Essential (primary) hypertension: Secondary | ICD-10-CM | POA: Diagnosis not present

## 2020-11-14 DIAGNOSIS — N186 End stage renal disease: Secondary | ICD-10-CM | POA: Diagnosis not present

## 2020-11-14 LAB — CBG MONITORING, ED: Glucose-Capillary: 73 mg/dL (ref 70–99)

## 2020-11-14 MED ORDER — ACETAMINOPHEN 325 MG PO TABS
650.0000 mg | ORAL_TABLET | Freq: Once | ORAL | Status: AC
  Administered 2020-11-14: 650 mg via ORAL
  Filled 2020-11-14: qty 2

## 2020-11-14 NOTE — ED Provider Notes (Signed)
[facility] Provider Note   CSN: [id number] Arrival date & time: 11/14/20  0010   History Chief Complaint  Patient presents with  . Otalgia    Gary L Quaranta Sr. is a 70 y.o. male.  The history is provided by the patient.  Otalgia He has history of hypertension, diabetes, rheumatoid arthritis, chronic kidney disease, and comes in complaining of left ear pain for the last 2 weeks.  He describes just a dull, nagging ache although he did describe severe burning pain to the triage nurse.  He has not taken anything for pain.  He has not noticed any fullness in his ear, hearing loss, tinnitus.  He denies fever or chills.  Past Medical History:  Diagnosis Date  . Chronic cholecystitis   . Chronic kidney disease   . Diabetes mellitus without complication ([place])    Type II  . Hypertension   . Rheumatoid arthritis ([place])   . Vitamin D deficiency    Hx: of    Patient Active Problem List   Diagnosis Date Noted  . Chronic cholecystitis with calculus 10/28/2017  . Vitamin D deficiency   . Rheumatoid arthritis ([place])   . Diabetes mellitus without complication ([place])   . Chronic kidney disease   . Pressure injury of skin 09/29/2016  . Pleural effusion   . PAF (paroxysmal atrial fibrillation) ([place])   . Acute abdominal pain   . Fever   . Abdominal distention, non-gaseous   . Acute cholecystitis   . AKI (acute kidney injury) ([place])   . Acute respiratory failure with hypoxia ([place])   . Community acquired pneumonia of right lower lobe of lung 09/05/2016  . Elevated troponin   . Sepsis ([place])   . Hypokalemia   . Erectile dysfunction 02/01/2014  . Rash and nonspecific skin eruption 11/04/2013  . Esophageal reflux 11/04/2013  . End stage renal disease ([place]) 09/23/2013  . AVF (arteriovenous fistula) ([place]) 08/17/2013  . Glomerulonephritis, focal sclerosing 07/06/2013  . Diabetes mellitus ([place]) 02/07/2012  . Hypertension 09/02/2008  . Rheumatoid arthritis(714.0)  09/02/1996    Past Surgical History:  Procedure Laterality Date  . AV FISTULA PLACEMENT Left 06/28/2013   Procedure: ARTERIOVENOUS (AV) FISTULA CREATION- Left arm with ultrasound guidance;  Surgeon: Rosetta Posner, MD;  Location: [facility];  Service: Vascular;  Laterality: Left;  . CHOLECYSTECTOMY N/A 10/28/2017   Procedure: LAPAROSCOPIC CHOLECYSTECTOMY WITH INTRAOPERATIVE CHOLANGIOGRAM;  Surgeon: Donnie Mesa, MD;  Location: [facility];  Service: General;  Laterality: N/A;  . COLONOSCOPY     hx: of  . HERNIA REPAIR    . INSERTION OF DIALYSIS CATHETER N/A 06/28/2013   Procedure: INSERTION OF DIALYSIS CATHETER;  Surgeon: Rosetta Posner, MD;  Location: [facility];  Service: Vascular;  Laterality: N/A;  . IR GENERIC HISTORICAL  09/08/2016   IR PERC CHOLECYSTOSTOMY 09/08/2016 [name], MD [facility]  . IR GENERIC HISTORICAL  10/15/2016   IR RADIOLOGIST EVAL & MGMT 10/15/2016 Ardis Rowan, PA-C [facility]  . IR GENERIC HISTORICAL  10/29/2016   IR RADIOLOGIST EVAL & MGMT 10/29/2016 Jacqulynn Cadet, MD [facility]  . LIGATION OF COMPETING BRANCHES OF ARTERIOVENOUS FISTULA Left 10/12/2013   Procedure: LIGATION OF COMPETING BRANCHES OF ARTERIOVENOUS FISTULA;  Surgeon: Elam Dutch, MD;  Location: [facility] OR;  Service: Vascular;  Laterality: Left;  . neck surgery         Family History  Problem Relation Age of Onset  . Cancer Mother   . Arthritis/Rheumatoid  Sister     Social History   Tobacco Use  . Smoking status: Former Smoker    Packs/day: 0.50    Years: 10.00    Pack years: 5.00    Types: Cigarettes    Quit date: 09/02/1976    Years since quitting: 44.2  . Smokeless tobacco: Never Used  Vaping Use  . Vaping Use: Never used  Substance Use Topics  . Alcohol use: No  . Drug use: No    Home Medications Prior to Admission medications   Medication Sig Start Date End Date Taking? Authorizing Provider  acetaminophen (TYLENOL) 500 MG tablet Take 500 mg by mouth daily as needed  for moderate pain.  02/07/12   Zenia Resides, MD  cholecalciferol (VITAMIN D) 1000 units tablet Take 1,000 Units by mouth daily.    [provider]  diltiazem (CARDIZEM CD) 180 MG 24 hr capsule Take 180 mg by mouth daily.    [provider]  ferrous sulfate (FERROUSUL) 325 (65 FE) MG tablet Take 1 tablet (325 mg total) by mouth daily with breakfast. 09/29/16   Elgergawy, Silver Huguenin, MD  furosemide (LASIX) 40 MG tablet Take 40 mg by mouth daily.    [provider]  hydrocortisone cream 1 % Apply 1 application topically daily as needed for itching.    [provider]  insulin aspart (NOVOLOG) 100 UNIT/ML injection Inject 16 Units into the skin 3 (three) times daily before meals.    [provider]  insulin glargine (LANTUS) 100 UNIT/ML injection Inject 50 Units into the skin at bedtime.    [provider]  Insulin Pen Needle (PEN NEEDLES) 31G X 6 MM MISC Use 1 needle per injection as directed 07/01/13   Patrecia Pour, MD  Multiple Vitamins-Minerals (CENTRUM SILVER 50+MEN) TABS Take 1 tablet by mouth daily.    [provider]  oxyCODONE (OXY IR/ROXICODONE) 5 MG immediate release tablet Take 1 tablet (5 mg total) by mouth every 4 (four) hours as needed for moderate pain. 10/29/17   Donnie Mesa, MD  predniSONE (DELTASONE) 10 MG tablet Take 10 mg by mouth daily as needed (arthritis).  06/21/17   [provider]  sodium bicarbonate 650 MG tablet Take 650 mg by mouth daily.     [provider]  TRULICITY 1.5 ZO/1.0RU SOPN Inject 0.5 mLs as directed every Wednesday.  08/13/16   [provider]  UNIFINE PENTIPS 31G X 5 MM MISC USE AS DIRECTED WITH INSULIN    Waldemar Dickens, MD    Allergies    Glyburide and Methotrexate derivatives  Review of Systems   Review of Systems  HENT: Positive for ear pain.   All other systems reviewed and are negative.   Physical Exam Updated Vital Signs BP (!) 177/76 (BP Location:  Right Arm)   Pulse 76   Temp 98 F (36.7 C) (Oral)   Resp 18   Ht 6\' 1"  (1.854 m)   Wt 106 kg   SpO2 98%   BMI 30.83 kg/m   Physical Exam Vitals and nursing note reviewed.   70 year old male, resting comfortably and in no acute distress. Vital signs are significant for elevated blood pressure. Oxygen saturation is 98%, which is normal. Head is normocephalic and atraumatic. PERRLA, EOMI. Oropharynx is clear.  Right tympanic membrane is clear.  Left tympanic membrane is thickened and opacified without erythema or bulging.  There is no pain when tension is applied to the helix of either ear  and external auditory canals appear normal. Neck is nontender and supple without adenopathy or JVD. Back is nontender and there is no CVA tenderness. Lungs are clear without rales, wheezes, or rhonchi. Chest is nontender. Heart has regular rate and rhythm without murmur. Abdomen is soft, flat, nontender without masses or hepatosplenomegaly and peristalsis is normoactive. Extremities have no cyanosis or edema, full range of motion is present. Skin is warm and dry without rash. Neurologic: Mental status is normal, cranial nerves are intact, there are no motor or sensory deficits.  ED Results / Procedures / Treatments   Labs (all labs ordered are listed, but only abnormal results are displayed) Labs Reviewed  CBG MONITORING, ED   Procedures Procedures   Medications Ordered in ED Medications  acetaminophen (TYLENOL) tablet 650 mg (has no administration in time range)    ED Course  I have reviewed the triage vital signs and the nursing notes.  Pertinent lab results that were available during my care of the patient were reviewed by me and considered in my medical decision making (see chart for details).  MDM Rules/Calculators/A&P Left ear pain with thickened and opaque tympanic membrane, causes unclear.  No evidence of infection.  I do not see any indication for antibiotics.  Old records are  reviewed, and he has renal insufficiency and did have creatinines as high as 4 in the past.  Therefore, will avoid NSAIDs.  He is advised to take acetaminophen as needed for pain and is referred to ENT for follow-up.  Final Clinical Impression(s) / ED Diagnoses Final diagnoses:  Otalgia of left ear  Thick tympanic membrane  Elevated blood pressure reading with diagnosis of hypertension    Rx / DC Orders ED Discharge Orders    None       Delora Fuel, MD 01/77/93 [phone]

## 2020-11-14 NOTE — Discharge Instructions (Addendum)
Your eardrum is thickened, but there does not appear to be an infection.  The cause for this thickening is not clear.  Please follow-up with the ENT physician.  You may take acetaminophen as needed for pain.  Return if pain is getting worse.

## 2020-11-14 NOTE — ED Triage Notes (Signed)
Pt states he has been having L ear pain x 2 weeks and tonight it "got unbearable". Pt states he "got real hot" and thought he might pass out. States he wasn't sure if it was because of pain or his blood sugar. Pt states he drank some orange juice and seemed to feel " a little better".

## 2020-12-05 ENCOUNTER — Other Ambulatory Visit: Payer: Self-pay

## 2020-12-05 ENCOUNTER — Ambulatory Visit (INDEPENDENT_AMBULATORY_CARE_PROVIDER_SITE_OTHER): Payer: Medicare Other | Admitting: Otolaryngology

## 2020-12-05 VITALS — Temp 97.7°F

## 2020-12-05 DIAGNOSIS — H6122 Impacted cerumen, left ear: Secondary | ICD-10-CM

## 2020-12-05 NOTE — Progress Notes (Signed)
HPI: Gary L Mandel Sr. is a 70 y.o. male who presents for evaluation of what he considers to be a left ear infection with decreased hearing.  He has not had any drainage from the ear and not much pain in the ear.  But he has decreased hearing in the left ear.  The right ear feels normal.  Past Medical History:  Diagnosis Date  . Chronic cholecystitis   . Chronic kidney disease   . Diabetes mellitus without complication ([place])    Type II  . Hypertension   . Rheumatoid arthritis ([place])   . Vitamin D deficiency    Hx: of   Past Surgical History:  Procedure Laterality Date  . AV FISTULA PLACEMENT Left 06/28/2013   Procedure: ARTERIOVENOUS (AV) FISTULA CREATION- Left arm with ultrasound guidance;  Surgeon: Rosetta Posner, MD;  Location: [place];  Service: Vascular;  Laterality: Left;  . CHOLECYSTECTOMY N/A 10/28/2017   Procedure: LAPAROSCOPIC CHOLECYSTECTOMY WITH INTRAOPERATIVE CHOLANGIOGRAM;  Surgeon: Donnie Mesa, MD;  Location: [place];  Service: General;  Laterality: N/A;  . COLONOSCOPY     hx: of  . HERNIA REPAIR    . INSERTION OF DIALYSIS CATHETER N/A 06/28/2013   Procedure: INSERTION OF DIALYSIS CATHETER;  Surgeon: Rosetta Posner, MD;  Location: [place];  Service: Vascular;  Laterality: N/A;  . IR GENERIC HISTORICAL  09/08/2016   IR PERC CHOLECYSTOSTOMY 09/08/2016 [name], MD [facility]  . IR GENERIC HISTORICAL  10/15/2016   IR RADIOLOGIST EVAL & MGMT 10/15/2016 Ardis Rowan, PA-C [facility]  . IR GENERIC HISTORICAL  10/29/2016   IR RADIOLOGIST EVAL & MGMT 10/29/2016 Jacqulynn Cadet, MD [facility]  . LIGATION OF COMPETING BRANCHES OF ARTERIOVENOUS FISTULA Left 10/12/2013   Procedure: LIGATION OF COMPETING BRANCHES OF ARTERIOVENOUS FISTULA;  Surgeon: Elam Dutch, MD;  Location: [facility];  Service: Vascular;  Laterality: Left;  . neck surgery     Social History   Socioeconomic History  . Marital status: Married    Spouse name: Not on file  . Number of children:  Not on file  . Years of education: Not on file  . Highest education level: Not on file  Occupational History  . Not on file  Tobacco Use  . Smoking status: Former Smoker    Packs/day: 0.50    Years: 10.00    Pack years: 5.00    Types: Cigarettes    Quit date: 09/02/1976    Years since quitting: 44.2  . Smokeless tobacco: Never Used  Vaping Use  . Vaping Use: Never used  Substance and Sexual Activity  . Alcohol use: No  . Drug use: No  . Sexual activity: Not on file  Other Topics Concern  . Not on file  Social History Narrative  . Not on file   Social Determinants of Health   Financial Resource Strain: Not on file  Food Insecurity: Not on file  Transportation Needs: Not on file  Physical Activity: Not on file  Stress: Not on file  Social Connections: Not on file   Family History  Problem Relation Age of Onset  . Cancer Mother   . Arthritis/Rheumatoid Sister    Allergies  Allergen Reactions  . Glyburide     UNSPECIFIED REACTION   . Methotrexate Derivatives     UNSPECIFIED REACTION    Prior to Admission medications   Medication Sig Start Date End Date Taking? Authorizing Provider  acetaminophen (TYLENOL) 500 MG tablet Take 500 mg  by mouth daily as needed for moderate pain.  02/07/12   Zenia Resides, MD  cholecalciferol (VITAMIN D) 1000 units tablet Take 1,000 Units by mouth daily.    [provider]  diltiazem (CARDIZEM CD) 180 MG 24 hr capsule Take 180 mg by mouth daily.    [provider]  ferrous sulfate (FERROUSUL) 325 (65 FE) MG tablet Take 1 tablet (325 mg total) by mouth daily with breakfast. 09/29/16   Elgergawy, Silver Huguenin, MD  furosemide (LASIX) 40 MG tablet Take 40 mg by mouth daily.    [provider]  hydrocortisone cream 1 % Apply 1 application topically daily as needed for itching.    [provider]  insulin aspart (NOVOLOG) 100 UNIT/ML injection Inject 16 Units into the skin 3 (three) times daily before meals.     [provider]  insulin glargine (LANTUS) 100 UNIT/ML injection Inject 50 Units into the skin at bedtime.    [provider]  Insulin Pen Needle (PEN NEEDLES) 31G X 6 MM MISC Use 1 needle per injection as directed 07/01/13   Patrecia Pour, MD  Multiple Vitamins-Minerals (CENTRUM SILVER 50+MEN) TABS Take 1 tablet by mouth daily.    [provider]  oxyCODONE (OXY IR/ROXICODONE) 5 MG immediate release tablet Take 1 tablet (5 mg total) by mouth every 4 (four) hours as needed for moderate pain. 10/29/17   Donnie Mesa, MD  predniSONE (DELTASONE) 10 MG tablet Take 10 mg by mouth daily as needed (arthritis).  06/21/17   [provider]  sodium bicarbonate 650 MG tablet Take 650 mg by mouth daily.     [provider]  TRULICITY 1.5 FT/7.3UK SOPN Inject 0.5 mLs as directed every Wednesday.  08/13/16   [provider]  UNIFINE PENTIPS 31G X 5 MM MISC USE AS DIRECTED WITH INSULIN    Waldemar Dickens, MD     Positive ROS: Otherwise negative  All other systems have been reviewed and were otherwise negative with the exception of those mentioned in the HPI and as above.  Physical Exam: Constitutional: Alert, well-appearing, no acute distress Ears: External ears without lesions or tenderness.  Right ear canal and right TM are normal.  Left ear canal is occluded with cerumen that was adjacent to the left TM completely occluding the medial portion of the ear canal.  This was cleaned with suction.  The TM itself was clear with good mobility on pneumatic otoscopy.  After cleaning the ear canal the hearing felt much better.  He had no ear canal or TM abnormality or infection noted. Nasal: External nose without lesions. Septum with minimal deformity and mild rhinitis.. Clear nasal passages Oral: Lips and gums without lesions. Tongue and palate mucosa without lesions. Posterior oropharynx clear. Neck: No palpable adenopathy or masses Respiratory: Breathing  comfortably  Skin: No facial/neck lesions or rash noted.  Cerumen impaction removal  Date/Time: 12/05/2020 2:39 PM Performed by: Rozetta Nunnery, MD Authorized by: Rozetta Nunnery, MD   Consent:    Consent obtained:  Verbal   Consent given by:  Patient   Risks discussed:  Pain and bleeding Procedure details:    Location:  L ear   Procedure type: suction   Post-procedure details:    Inspection:  TM intact and canal normal   Hearing quality:  Improved   Patient tolerance of procedure:  Tolerated well, no immediate complications Comments:     Ear canal and TM is otherwise clear.  Assessment: Left ear cerumen impaction with no signs of infection.  Plan: Cautioned him about not using Q-tips in the ears as this appears to be secondary to Q-tip use.  Ear canal and TM otherwise clear.  Radene Journey, MD

## 2020-12-13 DIAGNOSIS — M059 Rheumatoid arthritis with rheumatoid factor, unspecified: Secondary | ICD-10-CM | POA: Diagnosis not present

## 2020-12-13 DIAGNOSIS — M25519 Pain in unspecified shoulder: Secondary | ICD-10-CM | POA: Diagnosis not present

## 2020-12-13 DIAGNOSIS — M79643 Pain in unspecified hand: Secondary | ICD-10-CM | POA: Diagnosis not present

## 2020-12-18 DIAGNOSIS — Z0000 Encounter for general adult medical examination without abnormal findings: Secondary | ICD-10-CM | POA: Diagnosis not present

## 2020-12-18 DIAGNOSIS — R112 Nausea with vomiting, unspecified: Secondary | ICD-10-CM | POA: Diagnosis not present

## 2020-12-18 DIAGNOSIS — R739 Hyperglycemia, unspecified: Secondary | ICD-10-CM | POA: Diagnosis not present

## 2020-12-18 DIAGNOSIS — D464 Refractory anemia, unspecified: Secondary | ICD-10-CM | POA: Diagnosis not present

## 2020-12-18 DIAGNOSIS — R634 Abnormal weight loss: Secondary | ICD-10-CM | POA: Diagnosis not present

## 2020-12-18 DIAGNOSIS — E1122 Type 2 diabetes mellitus with diabetic chronic kidney disease: Secondary | ICD-10-CM | POA: Diagnosis not present

## 2020-12-20 DIAGNOSIS — M059 Rheumatoid arthritis with rheumatoid factor, unspecified: Secondary | ICD-10-CM | POA: Diagnosis not present

## 2020-12-20 DIAGNOSIS — N184 Chronic kidney disease, stage 4 (severe): Secondary | ICD-10-CM | POA: Diagnosis not present

## 2020-12-20 DIAGNOSIS — E213 Hyperparathyroidism, unspecified: Secondary | ICD-10-CM | POA: Diagnosis not present

## 2020-12-20 DIAGNOSIS — D631 Anemia in chronic kidney disease: Secondary | ICD-10-CM | POA: Diagnosis not present

## 2020-12-20 DIAGNOSIS — I480 Paroxysmal atrial fibrillation: Secondary | ICD-10-CM | POA: Diagnosis not present

## 2020-12-20 DIAGNOSIS — I10 Essential (primary) hypertension: Secondary | ICD-10-CM | POA: Diagnosis not present

## 2020-12-20 DIAGNOSIS — D72829 Elevated white blood cell count, unspecified: Secondary | ICD-10-CM | POA: Diagnosis not present

## 2020-12-20 DIAGNOSIS — E1122 Type 2 diabetes mellitus with diabetic chronic kidney disease: Secondary | ICD-10-CM | POA: Diagnosis not present

## 2020-12-20 DIAGNOSIS — E782 Mixed hyperlipidemia: Secondary | ICD-10-CM | POA: Diagnosis not present

## 2021-01-05 DIAGNOSIS — Z794 Long term (current) use of insulin: Secondary | ICD-10-CM | POA: Diagnosis not present

## 2021-01-05 DIAGNOSIS — N184 Chronic kidney disease, stage 4 (severe): Secondary | ICD-10-CM | POA: Diagnosis not present

## 2021-01-05 DIAGNOSIS — M069 Rheumatoid arthritis, unspecified: Secondary | ICD-10-CM | POA: Diagnosis not present

## 2021-01-05 DIAGNOSIS — R5383 Other fatigue: Secondary | ICD-10-CM | POA: Diagnosis not present

## 2021-01-05 DIAGNOSIS — M7989 Other specified soft tissue disorders: Secondary | ICD-10-CM | POA: Diagnosis not present

## 2021-01-05 DIAGNOSIS — Z7952 Long term (current) use of systemic steroids: Secondary | ICD-10-CM | POA: Diagnosis not present

## 2021-01-05 DIAGNOSIS — R5381 Other malaise: Secondary | ICD-10-CM | POA: Diagnosis not present

## 2021-01-05 DIAGNOSIS — E119 Type 2 diabetes mellitus without complications: Secondary | ICD-10-CM | POA: Diagnosis not present

## 2021-01-05 DIAGNOSIS — Z8739 Personal history of other diseases of the musculoskeletal system and connective tissue: Secondary | ICD-10-CM | POA: Diagnosis not present

## 2021-02-02 DIAGNOSIS — Z794 Long term (current) use of insulin: Secondary | ICD-10-CM | POA: Diagnosis not present

## 2021-02-02 DIAGNOSIS — E119 Type 2 diabetes mellitus without complications: Secondary | ICD-10-CM | POA: Diagnosis not present

## 2021-02-02 DIAGNOSIS — N184 Chronic kidney disease, stage 4 (severe): Secondary | ICD-10-CM | POA: Diagnosis not present

## 2021-02-02 DIAGNOSIS — R5383 Other fatigue: Secondary | ICD-10-CM | POA: Diagnosis not present

## 2021-02-02 DIAGNOSIS — Z7952 Long term (current) use of systemic steroids: Secondary | ICD-10-CM | POA: Diagnosis not present

## 2021-02-02 DIAGNOSIS — M069 Rheumatoid arthritis, unspecified: Secondary | ICD-10-CM | POA: Diagnosis not present

## 2021-02-02 DIAGNOSIS — R5381 Other malaise: Secondary | ICD-10-CM | POA: Diagnosis not present

## 2021-02-12 DIAGNOSIS — N189 Chronic kidney disease, unspecified: Secondary | ICD-10-CM | POA: Diagnosis not present

## 2021-02-12 DIAGNOSIS — D631 Anemia in chronic kidney disease: Secondary | ICD-10-CM | POA: Diagnosis not present

## 2021-02-12 DIAGNOSIS — I770 Arteriovenous fistula, acquired: Secondary | ICD-10-CM | POA: Diagnosis not present

## 2021-02-12 DIAGNOSIS — N1832 Chronic kidney disease, stage 3b: Secondary | ICD-10-CM | POA: Diagnosis not present

## 2021-02-12 DIAGNOSIS — N051 Unspecified nephritic syndrome with focal and segmental glomerular lesions: Secondary | ICD-10-CM | POA: Diagnosis not present

## 2021-02-12 DIAGNOSIS — N1831 Chronic kidney disease, stage 3a: Secondary | ICD-10-CM | POA: Diagnosis not present

## 2021-02-12 DIAGNOSIS — I129 Hypertensive chronic kidney disease with stage 1 through stage 4 chronic kidney disease, or unspecified chronic kidney disease: Secondary | ICD-10-CM | POA: Diagnosis not present

## 2021-02-12 DIAGNOSIS — N2581 Secondary hyperparathyroidism of renal origin: Secondary | ICD-10-CM | POA: Diagnosis not present

## 2021-02-12 DIAGNOSIS — E872 Acidosis: Secondary | ICD-10-CM | POA: Diagnosis not present

## 2021-03-21 DIAGNOSIS — E119 Type 2 diabetes mellitus without complications: Secondary | ICD-10-CM | POA: Diagnosis not present

## 2021-03-21 DIAGNOSIS — I10 Essential (primary) hypertension: Secondary | ICD-10-CM | POA: Diagnosis not present

## 2021-03-30 DIAGNOSIS — I480 Paroxysmal atrial fibrillation: Secondary | ICD-10-CM | POA: Diagnosis not present

## 2021-03-30 DIAGNOSIS — E1122 Type 2 diabetes mellitus with diabetic chronic kidney disease: Secondary | ICD-10-CM | POA: Diagnosis not present

## 2021-03-30 DIAGNOSIS — E211 Secondary hyperparathyroidism, not elsewhere classified: Secondary | ICD-10-CM | POA: Diagnosis not present

## 2021-03-30 DIAGNOSIS — I10 Essential (primary) hypertension: Secondary | ICD-10-CM | POA: Diagnosis not present

## 2021-03-30 DIAGNOSIS — D638 Anemia in other chronic diseases classified elsewhere: Secondary | ICD-10-CM | POA: Diagnosis not present

## 2021-03-30 DIAGNOSIS — E782 Mixed hyperlipidemia: Secondary | ICD-10-CM | POA: Diagnosis not present

## 2021-03-30 DIAGNOSIS — D72829 Elevated white blood cell count, unspecified: Secondary | ICD-10-CM | POA: Diagnosis not present

## 2021-03-30 DIAGNOSIS — Z0001 Encounter for general adult medical examination with abnormal findings: Secondary | ICD-10-CM | POA: Diagnosis not present

## 2021-03-30 DIAGNOSIS — R809 Proteinuria, unspecified: Secondary | ICD-10-CM | POA: Diagnosis not present

## 2021-03-30 DIAGNOSIS — M069 Rheumatoid arthritis, unspecified: Secondary | ICD-10-CM | POA: Diagnosis not present

## 2021-03-30 DIAGNOSIS — N184 Chronic kidney disease, stage 4 (severe): Secondary | ICD-10-CM | POA: Diagnosis not present

## 2021-04-06 DIAGNOSIS — R5383 Other fatigue: Secondary | ICD-10-CM | POA: Diagnosis not present

## 2021-04-06 DIAGNOSIS — Z7952 Long term (current) use of systemic steroids: Secondary | ICD-10-CM | POA: Diagnosis not present

## 2021-04-06 DIAGNOSIS — E119 Type 2 diabetes mellitus without complications: Secondary | ICD-10-CM | POA: Diagnosis not present

## 2021-04-06 DIAGNOSIS — R5381 Other malaise: Secondary | ICD-10-CM | POA: Diagnosis not present

## 2021-04-06 DIAGNOSIS — M069 Rheumatoid arthritis, unspecified: Secondary | ICD-10-CM | POA: Diagnosis not present

## 2021-04-06 DIAGNOSIS — N184 Chronic kidney disease, stage 4 (severe): Secondary | ICD-10-CM | POA: Diagnosis not present

## 2021-04-18 DIAGNOSIS — Z1211 Encounter for screening for malignant neoplasm of colon: Secondary | ICD-10-CM | POA: Diagnosis not present

## 2021-04-18 DIAGNOSIS — K219 Gastro-esophageal reflux disease without esophagitis: Secondary | ICD-10-CM | POA: Diagnosis not present

## 2021-04-18 DIAGNOSIS — I10 Essential (primary) hypertension: Secondary | ICD-10-CM | POA: Diagnosis not present

## 2021-04-25 ENCOUNTER — Encounter: Payer: Self-pay | Admitting: Cardiology

## 2021-05-04 DIAGNOSIS — I10 Essential (primary) hypertension: Secondary | ICD-10-CM | POA: Diagnosis not present

## 2021-05-08 DIAGNOSIS — L509 Urticaria, unspecified: Secondary | ICD-10-CM | POA: Diagnosis not present

## 2021-05-08 DIAGNOSIS — R634 Abnormal weight loss: Secondary | ICD-10-CM | POA: Diagnosis not present

## 2021-05-08 DIAGNOSIS — R1310 Dysphagia, unspecified: Secondary | ICD-10-CM | POA: Diagnosis not present

## 2021-05-11 ENCOUNTER — Encounter: Payer: Self-pay | Admitting: Internal Medicine

## 2021-05-18 ENCOUNTER — Encounter (HOSPITAL_COMMUNITY): Payer: Self-pay | Admitting: *Deleted

## 2021-05-18 ENCOUNTER — Emergency Department (HOSPITAL_COMMUNITY)
Admission: EM | Admit: 2021-05-18 | Discharge: 2021-05-18 | Disposition: A | Discharge status: Home / Self Care | Payer: Medicare Other | Attending: Emergency Medicine | Admitting: Emergency Medicine

## 2021-05-18 ENCOUNTER — Emergency Department (HOSPITAL_COMMUNITY): Payer: Medicare Other

## 2021-05-18 ENCOUNTER — Encounter (HOSPITAL_COMMUNITY): Payer: Self-pay

## 2021-05-18 ENCOUNTER — Emergency Department (HOSPITAL_COMMUNITY)
Admission: EM | Admit: 2021-05-18 | Discharge: 2021-05-18 | Disposition: A | Discharge status: Home / Self Care | Payer: Medicare Other | Source: Home / Self Care | Attending: Emergency Medicine | Admitting: Emergency Medicine

## 2021-05-18 ENCOUNTER — Other Ambulatory Visit: Payer: Self-pay

## 2021-05-18 DIAGNOSIS — Z992 Dependence on renal dialysis: Secondary | ICD-10-CM | POA: Insufficient documentation

## 2021-05-18 DIAGNOSIS — I120 Hypertensive chronic kidney disease with stage 5 chronic kidney disease or end stage renal disease: Secondary | ICD-10-CM | POA: Insufficient documentation

## 2021-05-18 DIAGNOSIS — E1122 Type 2 diabetes mellitus with diabetic chronic kidney disease: Secondary | ICD-10-CM | POA: Insufficient documentation

## 2021-05-18 DIAGNOSIS — Z794 Long term (current) use of insulin: Secondary | ICD-10-CM | POA: Insufficient documentation

## 2021-05-18 DIAGNOSIS — N289 Disorder of kidney and ureter, unspecified: Secondary | ICD-10-CM | POA: Insufficient documentation

## 2021-05-18 DIAGNOSIS — R1310 Dysphagia, unspecified: Secondary | ICD-10-CM | POA: Insufficient documentation

## 2021-05-18 DIAGNOSIS — R0789 Other chest pain: Secondary | ICD-10-CM | POA: Insufficient documentation

## 2021-05-18 DIAGNOSIS — Z79899 Other long term (current) drug therapy: Secondary | ICD-10-CM | POA: Insufficient documentation

## 2021-05-18 DIAGNOSIS — N186 End stage renal disease: Secondary | ICD-10-CM | POA: Insufficient documentation

## 2021-05-18 DIAGNOSIS — Z87891 Personal history of nicotine dependence: Secondary | ICD-10-CM | POA: Insufficient documentation

## 2021-05-18 DIAGNOSIS — R7309 Other abnormal glucose: Secondary | ICD-10-CM | POA: Insufficient documentation

## 2021-05-18 DIAGNOSIS — Z5321 Procedure and treatment not carried out due to patient leaving prior to being seen by health care provider: Secondary | ICD-10-CM | POA: Insufficient documentation

## 2021-05-18 DIAGNOSIS — R079 Chest pain, unspecified: Secondary | ICD-10-CM | POA: Diagnosis not present

## 2021-05-18 LAB — CBC
HCT: 38.1 % — ABNORMAL LOW (ref 39.0–52.0)
Hemoglobin: 11.8 g/dL — ABNORMAL LOW (ref 13.0–17.0)
MCH: 27.3 pg (ref 26.0–34.0)
MCHC: 31 g/dL (ref 30.0–36.0)
MCV: 88 fL (ref 80.0–100.0)
Platelets: 260 10*3/uL (ref 150–400)
RBC: 4.33 MIL/uL (ref 4.22–5.81)
RDW: 13.4 % (ref 11.5–15.5)
WBC: 11.2 10*3/uL — ABNORMAL HIGH (ref 4.0–10.5)
nRBC: 0 % (ref 0.0–0.2)

## 2021-05-18 LAB — BASIC METABOLIC PANEL
Anion gap: 8 (ref 5–15)
BUN: 20 mg/dL (ref 8–23)
CO2: 26 mmol/L (ref 22–32)
Calcium: 8.9 mg/dL (ref 8.9–10.3)
Chloride: 103 mmol/L (ref 98–111)
Creatinine, Ser: 2.14 mg/dL — ABNORMAL HIGH (ref 0.61–1.24)
GFR, Estimated: 32 mL/min — ABNORMAL LOW (ref >60)
Glucose, Bld: 211 mg/dL — ABNORMAL HIGH (ref 70–99)
Potassium: 4.1 mmol/L (ref 3.5–5.1)
Sodium: 137 mmol/L (ref 135–145)

## 2021-05-18 LAB — TROPONIN I (HIGH SENSITIVITY)
Troponin I (High Sensitivity): 5 ng/L (ref <18)
Troponin I (High Sensitivity): 6 ng/L (ref <18)

## 2021-05-18 LAB — CBG MONITORING, ED: Glucose-Capillary: 140 mg/dL — ABNORMAL HIGH (ref 70–99)

## 2021-05-18 NOTE — ED Provider Notes (Signed)
[facility] EMERGENCY DEPARTMENT Provider Note   CSN: [id number] Arrival date & time: 05/18/21  1452     History Chief Complaint  Patient presents with   Dysphagia    Gary L Verma Sr. is a 70 y.o. male.  Patient states that for about a month has been having food gets stuck intermittently at about lower part of the sternum.  No concern for food being stuck currently.  Patient was at [facility] ED last evening did have blood work done which included 2 troponins which were normal.  But patient was not seen.  Patient states definitely related to food.  Past medical history is noted for chronic kidney disease and diabetes melitis.  Patient followed by nephrology and Dr. Nevada Crane.  Patient has not been seen by gastroenterology.      Past Medical History:  Diagnosis Date   Chronic cholecystitis    Chronic kidney disease    Diabetes mellitus without complication ([facility])    Type II   Hypertension    Rheumatoid arthritis ([place])    Vitamin D deficiency    Hx: of    Patient Active Problem List   Diagnosis Date Noted   Chronic cholecystitis with calculus 10/28/2017   Vitamin D deficiency    Rheumatoid arthritis ([place])    Diabetes mellitus without complication ([place])    Chronic kidney disease    Pressure injury of skin 09/29/2016   Pleural effusion    PAF (paroxysmal atrial fibrillation) (H[facility])    Acute abdominal pain    Fever    Abdominal distention, non-gaseous    Acute cholecystitis    AKI (acute kidney injury) ([place])    Acute respiratory failure with hypoxia ([place])    Community acquired pneumonia of right lower lobe of lung 09/05/2016   Elevated troponin    Sepsis ([place])    Hypokalemia    Erectile dysfunction 02/01/2014   Rash and nonspecific skin eruption 11/04/2013   Esophageal reflux 11/04/2013   End stage renal disease ([place]) 09/23/2013   AVF (arteriovenous fistula) ([place]) 08/17/2013   Glomerulonephritis, focal sclerosing 07/06/2013   Diabetes mellitus ([place]) 02/07/2012    Hypertension 09/02/2008   Rheumatoid arthritis(714.0) 09/02/1996    Past Surgical History:  Procedure Laterality Date   AV FISTULA PLACEMENT Left 06/28/2013   Procedure: ARTERIOVENOUS (AV) FISTULA CREATION- Left arm with ultrasound guidance;  Surgeon: Rosetta Posner, MD;  Location: [place];  Service: Vascular;  Laterality: Left;   CHOLECYSTECTOMY N/A 10/28/2017   Procedure: LAPAROSCOPIC CHOLECYSTECTOMY WITH INTRAOPERATIVE CHOLANGIOGRAM;  Surgeon: Donnie Mesa, MD;  Location: [place];  Service: General;  Laterality: N/A;   COLONOSCOPY     hx: of   HERNIA REPAIR     INSERTION OF DIALYSIS CATHETER N/A 06/28/2013   Procedure: INSERTION OF DIALYSIS CATHETER;  Surgeon: Rosetta Posner, MD;  Location: [place];  Service: Vascular;  Laterality: N/A;   IR GENERIC HISTORICAL  09/08/2016   IR PERC CHOLECYSTOSTOMY 09/08/2016 [name], MD [facility]   IR GENERIC HISTORICAL  10/15/2016   IR RADIOLOGIST EVAL & MGMT 10/15/2016 Ardis Rowan, PA-C [facility]   IR GENERIC HISTORICAL  10/29/2016   IR RADIOLOGIST EVAL & MGMT 10/29/2016 Jacqulynn Cadet, MD [facility]   LIGATION OF COMPETING BRANCHES OF ARTERIOVENOUS FISTULA Left 10/12/2013   Procedure: LIGATION OF COMPETING BRANCHES OF ARTERIOVENOUS FISTULA;  Surgeon: Elam Dutch, MD;  Location: [place];  Service: Vascular;  Laterality: Left;   neck surgery  Family History  Problem Relation Age of Onset   Cancer Mother    Arthritis/Rheumatoid Sister     Social History   Tobacco Use   Smoking status: Former    Packs/day: 0.50    Years: 10.00    Pack years: 5.00    Types: Cigarettes    Quit date: 09/02/1976    Years since quitting: 44.7   Smokeless tobacco: Never  Vaping Use   Vaping Use: Never used  Substance Use Topics   Alcohol use: No   Drug use: No    Home Medications Prior to Admission medications   Medication Sig Start Date End Date Taking? Authorizing Provider  acetaminophen (TYLENOL) 500 MG tablet Take 500 mg  by mouth daily as needed for moderate pain.  02/07/12   Zenia Resides, MD  atorvastatin (LIPITOR) 40 MG tablet Take 40 mg by mouth daily. 05/08/21   [provider]  cholecalciferol (VITAMIN D) 1000 units tablet Take 1,000 Units by mouth daily.    [provider]  cloNIDine (CATAPRES) 0.2 MG tablet Take 0.2 mg by mouth 3 (three) times daily. 05/11/21   [provider]  diltiazem (CARDIZEM CD) 180 MG 24 hr capsule Take 180 mg by mouth daily.    [provider]  diltiazem (CARDIZEM CD) 180 MG 24 hr capsule 1 capsule    [provider]  Dulaglutide (TRULICITY) 1.5 GH/8.2XH SOPN See admin instructions.    [provider]  ferrous sulfate (FERROUSUL) 325 (65 FE) MG tablet Take 1 tablet (325 mg total) by mouth daily with breakfast. 09/29/16   Elgergawy, Silver Huguenin, MD  furosemide (LASIX) 40 MG tablet Take 40 mg by mouth daily.    [provider]  hydrocortisone cream 1 % Apply 1 application topically daily as needed for itching.    [provider]  hydrOXYzine (ATARAX/VISTARIL) 25 MG tablet Take 25 mg by mouth 3 (three) times daily. 05/08/21   [provider]  insulin aspart (NOVOLOG) 100 UNIT/ML injection Inject 16 Units into the skin 3 (three) times daily before meals.    [provider]  insulin glargine (LANTUS) 100 UNIT/ML injection Inject 50 Units into the skin at bedtime.    [provider]  insulin glargine (LANTUS) 100 UNIT/ML injection See admin instructions.    [provider]  insulin lispro (HUMALOG) 100 UNIT/ML cartridge See admin instructions.    [provider]  Insulin Pen Needle (PEN NEEDLES) 31G X 6 MM MISC Use 1 needle per injection as directed 07/01/13   Patrecia Pour, MD  leflunomide (ARAVA) 20 MG tablet 1 tablet    [provider]  lisinopril (ZESTRIL) 20 MG tablet 1 tablet    [provider]  Multiple Vitamins-Minerals (CENTRUM SILVER 50+MEN) TABS Take 1  tablet by mouth daily.    [provider]  oxyCODONE (OXY IR/ROXICODONE) 5 MG immediate release tablet Take 1 tablet (5 mg total) by mouth every 4 (four) hours as needed for moderate pain. 10/29/17   Donnie Mesa, MD  pantoprazole (PROTONIX) 20 MG tablet 1 tablet    [provider]  predniSONE (DELTASONE) 10 MG tablet Take 10 mg by mouth daily as needed (arthritis).  06/21/17   [provider]  predniSONE (DELTASONE) 20 MG tablet Take 20 mg by mouth daily. 05/08/21   [provider]  sodium bicarbonate 650 MG tablet Take 650 mg by mouth daily.     [provider]  traMADol (ULTRAM) 50 MG tablet 1  tablet as needed    [provider]  TRULICITY 1.5 JK/9.3OI SOPN Inject 0.5 mLs as directed every Wednesday.  08/13/16   [provider]  UNIFINE PENTIPS 31G X 5 MM MISC USE AS DIRECTED WITH INSULIN    Waldemar Dickens, MD    Allergies    Glyburide and Methotrexate derivatives  Review of Systems   Review of Systems  Constitutional:  Negative for chills and fever.  HENT:  Positive for trouble swallowing. Negative for rhinorrhea and sore throat.   Eyes:  Negative for visual disturbance.  Respiratory:  Negative for cough and shortness of breath.   Cardiovascular:  Negative for chest pain and leg swelling.  Gastrointestinal:  Negative for abdominal pain, diarrhea, nausea and vomiting.  Genitourinary:  Negative for dysuria.  Musculoskeletal:  Negative for back pain and neck pain.  Skin:  Negative for rash.  Neurological:  Negative for dizziness, light-headedness and headaches.  Hematological:  Does not bruise/bleed easily.  Psychiatric/Behavioral:  Negative for confusion.    Physical Exam Updated Vital Signs BP 128/68   Pulse 71   Temp 97.8 F (36.6 C)   Resp 20   SpO2 99%   Physical Exam Vitals and nursing note reviewed.  Constitutional:      General: He is not in acute distress.    Appearance: Normal appearance. He is  well-developed. He is not ill-appearing or toxic-appearing.  HENT:     Head: Normocephalic and atraumatic.  Eyes:     Extraocular Movements: Extraocular movements intact.     Conjunctiva/sclera: Conjunctivae normal.     Pupils: Pupils are equal, round, and reactive to light.  Cardiovascular:     Rate and Rhythm: Normal rate and regular rhythm.     Heart sounds: No murmur heard. Pulmonary:     Effort: Pulmonary effort is normal. No respiratory distress.     Breath sounds: Normal breath sounds.  Chest:     Chest wall: No tenderness.  Abdominal:     Palpations: Abdomen is soft.     Tenderness: There is no abdominal tenderness.  Musculoskeletal:        General: Normal range of motion.     Cervical back: Normal range of motion and neck supple.  Skin:    General: Skin is warm and dry.  Neurological:     General: No focal deficit present.     Mental Status: He is alert and oriented to person, place, and time.     Cranial Nerves: No cranial nerve deficit.     Sensory: No sensory deficit.     Motor: No weakness.    ED Results / Procedures / Treatments   Labs (all labs ordered are listed, but only abnormal results are displayed) Labs Reviewed - No data to display  EKG None  Radiology DG Chest 2 View  Result Date: 05/18/2021 CLINICAL DATA:  Chest pain EXAM: CHEST - 2 VIEW COMPARISON:  Chest radiograph 09/29/2016 FINDINGS: The cardiomediastinal silhouette is within normal limits. There is asymmetric elevation of the right hemidiaphragm, decreased since 2018. There is no focal consolidation or pulmonary edema. There is no pleural effusion or pneumothorax. There is no acute osseous abnormality. Cholecystectomy clips are noted. IMPRESSION: Asymmetric elevation of the right hemidiaphragm, decreased since 2018. Otherwise, no radiographic evidence of acute cardiopulmonary process. Electronically Signed   By: Valetta Mole M.D.   On: 05/18/2021 08:30    Procedures Procedures   Medications  Ordered in ED Medications - No data to display  ED Course  I have reviewed the triage vital signs and the nursing notes.  Pertinent labs & imaging results that were available during my care of the patient were reviewed by me and considered in my medical decision making (see chart for details).    MDM Rules/Calculators/A&P                           Patient had blood work done at [facility] emergency department last evening but was not seen.  Troponins were normal.  Lab work without any significant abnormalities other than elevated blood sugar but his GFR was in the 30s.  He is followed by nephrology.  I will need close follow-up for that.  We will give patient referral to gastroenterology for upper endoscopy to evaluate the intermittent dysphagia.  Patient also given precautions to return if food gets stuck.   Final Clinical Impression(s) / ED Diagnoses Final diagnoses:  Dysphagia, unspecified type  Renal insufficiency    Rx / DC Orders ED Discharge Orders     None        Fredia Sorrow, MD 05/18/21 [phone]

## 2021-05-18 NOTE — ED Notes (Signed)
Pt left AMA due to wait time

## 2021-05-18 NOTE — ED Triage Notes (Signed)
Digestion problems for over a month

## 2021-05-18 NOTE — ED Notes (Signed)
Pt provided discharge instructions and prescription information. Pt was given the opportunity to ask questions and questions were answered. Discharge signature not obtained in the setting of the COVID-19 pandemic in order to reduce high touch surfaces.  ° °

## 2021-05-18 NOTE — Discharge Instructions (Signed)
Return for food getting stuck and staying stuck for over an hour.  Otherwise give gastroenterology Dr. Ave Filter office a call for follow-up most likely will need upper endoscopy to evaluate what is causing the food to intermittently get stuck.  Also your kidney function GFR was in the 30s today.  Would follow-up with your nephrologist or Dr. [place] Crane to have that rechecked in about a week or 2.

## 2021-05-18 NOTE — ED Triage Notes (Signed)
Pt reports for the past month he has felt like his food his getting stuck in his esophagus. Pt denies difficulty swallowing, food goes down but then he belches and has discomfort in his chest after eating something. Pt denies abd pain. Pt was seen at his PCP for the same a few weeks ago, pt was given and antibiotic. Airway intact

## 2021-05-24 DIAGNOSIS — N1832 Chronic kidney disease, stage 3b: Secondary | ICD-10-CM | POA: Diagnosis not present

## 2021-05-24 DIAGNOSIS — N189 Chronic kidney disease, unspecified: Secondary | ICD-10-CM | POA: Diagnosis not present

## 2021-05-24 DIAGNOSIS — I10 Essential (primary) hypertension: Secondary | ICD-10-CM | POA: Diagnosis not present

## 2021-06-01 ENCOUNTER — Emergency Department (HOSPITAL_COMMUNITY)
Admission: EM | Admit: 2021-06-01 | Discharge: 2021-06-01 | Disposition: A | Discharge status: Home / Self Care | Payer: Medicare Other | Attending: Emergency Medicine | Admitting: Emergency Medicine

## 2021-06-01 ENCOUNTER — Other Ambulatory Visit: Payer: Self-pay

## 2021-06-01 ENCOUNTER — Emergency Department (HOSPITAL_COMMUNITY): Payer: Medicare Other

## 2021-06-01 ENCOUNTER — Encounter (HOSPITAL_COMMUNITY): Payer: Self-pay | Admitting: *Deleted

## 2021-06-01 DIAGNOSIS — Z992 Dependence on renal dialysis: Secondary | ICD-10-CM | POA: Diagnosis not present

## 2021-06-01 DIAGNOSIS — Z794 Long term (current) use of insulin: Secondary | ICD-10-CM | POA: Diagnosis not present

## 2021-06-01 DIAGNOSIS — Z9049 Acquired absence of other specified parts of digestive tract: Secondary | ICD-10-CM | POA: Diagnosis not present

## 2021-06-01 DIAGNOSIS — R1310 Dysphagia, unspecified: Secondary | ICD-10-CM | POA: Diagnosis not present

## 2021-06-01 DIAGNOSIS — Z87891 Personal history of nicotine dependence: Secondary | ICD-10-CM | POA: Insufficient documentation

## 2021-06-01 DIAGNOSIS — R1013 Epigastric pain: Secondary | ICD-10-CM | POA: Diagnosis not present

## 2021-06-01 DIAGNOSIS — Z79899 Other long term (current) drug therapy: Secondary | ICD-10-CM | POA: Insufficient documentation

## 2021-06-01 DIAGNOSIS — N186 End stage renal disease: Secondary | ICD-10-CM | POA: Diagnosis not present

## 2021-06-01 DIAGNOSIS — E1122 Type 2 diabetes mellitus with diabetic chronic kidney disease: Secondary | ICD-10-CM | POA: Diagnosis not present

## 2021-06-01 DIAGNOSIS — K4090 Unilateral inguinal hernia, without obstruction or gangrene, not specified as recurrent: Secondary | ICD-10-CM | POA: Diagnosis not present

## 2021-06-01 DIAGNOSIS — I120 Hypertensive chronic kidney disease with stage 5 chronic kidney disease or end stage renal disease: Secondary | ICD-10-CM | POA: Diagnosis not present

## 2021-06-01 LAB — COMPREHENSIVE METABOLIC PANEL
ALT: 16 U/L (ref 0–44)
AST: 15 U/L (ref 15–41)
Albumin: 3.2 g/dL — ABNORMAL LOW (ref 3.5–5.0)
Alkaline Phosphatase: 64 U/L (ref 38–126)
Anion gap: 7 (ref 5–15)
BUN: 14 mg/dL (ref 8–23)
CO2: 26 mmol/L (ref 22–32)
Calcium: 8.6 mg/dL — ABNORMAL LOW (ref 8.9–10.3)
Chloride: 105 mmol/L (ref 98–111)
Creatinine, Ser: 1.93 mg/dL — ABNORMAL HIGH (ref 0.61–1.24)
GFR, Estimated: 37 mL/min — ABNORMAL LOW (ref >60)
Glucose, Bld: 127 mg/dL — ABNORMAL HIGH (ref 70–99)
Potassium: 3.6 mmol/L (ref 3.5–5.1)
Sodium: 138 mmol/L (ref 135–145)
Total Bilirubin: 0.4 mg/dL (ref 0.3–1.2)
Total Protein: 6.6 g/dL (ref 6.5–8.1)

## 2021-06-01 LAB — CBC WITH DIFFERENTIAL/PLATELET
Abs Immature Granulocytes: 0.03 10*3/uL (ref 0.00–0.07)
Basophils Absolute: 0 10*3/uL (ref 0.0–0.1)
Basophils Relative: 0 %
Eosinophils Absolute: 0.5 10*3/uL (ref 0.0–0.5)
Eosinophils Relative: 8 %
HCT: 37.1 % — ABNORMAL LOW (ref 39.0–52.0)
Hemoglobin: 11.6 g/dL — ABNORMAL LOW (ref 13.0–17.0)
Immature Granulocytes: 0 %
Lymphocytes Relative: 26 %
Lymphs Abs: 1.9 10*3/uL (ref 0.7–4.0)
MCH: 27.4 pg (ref 26.0–34.0)
MCHC: 31.3 g/dL (ref 30.0–36.0)
MCV: 87.5 fL (ref 80.0–100.0)
Monocytes Absolute: 0.4 10*3/uL (ref 0.1–1.0)
Monocytes Relative: 6 %
Neutro Abs: 4.3 10*3/uL (ref 1.7–7.7)
Neutrophils Relative %: 60 %
Platelets: 200 10*3/uL (ref 150–400)
RBC: 4.24 MIL/uL (ref 4.22–5.81)
RDW: 13.2 % (ref 11.5–15.5)
WBC: 7.2 10*3/uL (ref 4.0–10.5)
nRBC: 0 % (ref 0.0–0.2)

## 2021-06-01 LAB — URINALYSIS, ROUTINE W REFLEX MICROSCOPIC
Bacteria, UA: NONE SEEN
Bilirubin Urine: NEGATIVE
Glucose, UA: NEGATIVE mg/dL
Hgb urine dipstick: NEGATIVE
Ketones, ur: NEGATIVE mg/dL
Leukocytes,Ua: NEGATIVE
Nitrite: NEGATIVE
Protein, ur: >=300 mg/dL — AB
Specific Gravity, Urine: 1.015 (ref 1.005–1.030)
pH: 5 (ref 5.0–8.0)

## 2021-06-01 LAB — LIPASE, BLOOD: Lipase: 32 U/L (ref 11–51)

## 2021-06-01 MED ORDER — PANTOPRAZOLE SODIUM 40 MG PO TBEC: 40.0000 mg | DELAYED_RELEASE_TABLET | Freq: Every day | ORAL | 0 refills | Status: DC

## 2021-06-01 MED ORDER — SUCRALFATE 1 G PO TABS: 1.0000 g | ORAL_TABLET | Freq: Three times a day (TID) | ORAL | 0 refills | Status: DC

## 2021-06-01 MED ORDER — FAMOTIDINE IN NACL 20-0.9 MG/50ML-% IV SOLN
20.0000 mg | Freq: Once | INTRAVENOUS | Status: AC
  Administered 2021-06-01: 20 mg via INTRAVENOUS
  Filled 2021-06-01: qty 50

## 2021-06-01 MED ORDER — ALUM & MAG HYDROXIDE-SIMETH 200-200-20 MG/5ML PO SUSP
30.0000 mL | Freq: Once | ORAL | Status: AC
  Administered 2021-06-01: 30 mL via ORAL
  Filled 2021-06-01: qty 30

## 2021-06-01 MED ORDER — LIDOCAINE VISCOUS HCL 2 % MT SOLN
15.0000 mL | Freq: Once | OROMUCOSAL | Status: AC
  Administered 2021-06-01: 15 mL via ORAL
  Filled 2021-06-01: qty 15

## 2021-06-01 NOTE — ED Triage Notes (Signed)
Pt c/o mid abdominal pain, dizziness, weakness, decreased appetite, nausea x 2 weeks. Pt reports he was seen in the ED at that time and told he had a hiatal hernia and needed to follow up with GI. He called GI and reports they are all booked up, but his pain is so bad he came back to the ED. He also reported 1 week ago he started itching and having a rash on his arms and back and has been taking Benadryl.

## 2021-06-01 NOTE — ED Provider Notes (Signed)
[facility] EMERGENCY DEPARTMENT Provider Note   CSN: [id number] Arrival date & time: 06/01/21  0941     History Chief Complaint  Patient presents with   Abdominal Pain    Gary L Brazill Sr. is a 70 y.o. male.  Pt presents to the ED today with abdominal pain.  Pt said he has had pain for the past few weeks.  Pt was seen here in the ED on 9/16 and was referred to GI.  Pt said he's tried to get an appt, but they are full.  He said his pain is worse, so he came back today.  Pt does complain of intermittent difficulty with swallowing.  Pt denies anything stuck now.  No f/c.  No n/v.  Pain is in the epigastrium.  His gb has been removed.      Past Medical History:  Diagnosis Date   Chronic cholecystitis    Chronic kidney disease    Diabetes mellitus without complication (HCC)    Type II   Hypertension    Rheumatoid arthritis ([place])    Vitamin D deficiency    Hx: of    Patient Active Problem List   Diagnosis Date Noted   Chronic cholecystitis with calculus 10/28/2017   Vitamin D deficiency    Rheumatoid arthritis ([place])    Diabetes mellitus without complication ([place])    Chronic kidney disease    Pressure injury of skin 09/29/2016   Pleural effusion    PAF (paroxysmal atrial fibrillation) (HCC)    Acute abdominal pain    Fever    Abdominal distention, non-gaseous    Acute cholecystitis    AKI (acute kidney injury) ([place])    Acute respiratory failure with hypoxia ([place])    Community acquired pneumonia of right lower lobe of lung 09/05/2016   Elevated troponin    Sepsis ([place])    Hypokalemia    Erectile dysfunction 02/01/2014   Rash and nonspecific skin eruption 11/04/2013   Esophageal reflux 11/04/2013   End stage renal disease ([place]) 09/23/2013   AVF (arteriovenous fistula) ([place]) 08/17/2013   Glomerulonephritis, focal sclerosing 07/06/2013   Diabetes mellitus ([place]) 02/07/2012   Hypertension 09/02/2008   Rheumatoid arthritis(714.0) 09/02/1996    Past Surgical  History:  Procedure Laterality Date   AV FISTULA PLACEMENT Left 06/28/2013   Procedure: ARTERIOVENOUS (AV) FISTULA CREATION- Left arm with ultrasound guidance;  Surgeon: Rosetta Posner, MD;  Location: [place];  Service: Vascular;  Laterality: Left;   CHOLECYSTECTOMY N/A 10/28/2017   Procedure: LAPAROSCOPIC CHOLECYSTECTOMY WITH INTRAOPERATIVE CHOLANGIOGRAM;  Surgeon: Donnie Mesa, MD;  Location: [place];  Service: General;  Laterality: N/A;   COLONOSCOPY     hx: of   HERNIA REPAIR     INSERTION OF DIALYSIS CATHETER N/A 06/28/2013   Procedure: INSERTION OF DIALYSIS CATHETER;  Surgeon: Rosetta Posner, MD;  Location: [place];  Service: Vascular;  Laterality: N/A;   IR GENERIC HISTORICAL  09/08/2016   IR PERC CHOLECYSTOSTOMY 09/08/2016 [name], MD [facility]   IR GENERIC HISTORICAL  10/15/2016   IR RADIOLOGIST EVAL & MGMT 10/15/2016 Ardis Rowan, PA-C [facility]   IR GENERIC HISTORICAL  10/29/2016   IR RADIOLOGIST EVAL & MGMT 10/29/2016 Jacqulynn Cadet, MD [facility]   LIGATION OF COMPETING BRANCHES OF ARTERIOVENOUS FISTULA Left 10/12/2013   Procedure: LIGATION OF COMPETING BRANCHES OF ARTERIOVENOUS FISTULA;  Surgeon: Elam Dutch, MD;  Location: [place];  Service: Vascular;  Laterality: Left;   neck surgery  Family History  Problem Relation Age of Onset   Cancer Mother    Arthritis/Rheumatoid Sister     Social History   Tobacco Use   Smoking status: Former    Packs/day: 0.50    Years: 10.00    Pack years: 5.00    Types: Cigarettes    Quit date: 09/02/1976    Years since quitting: 44.7   Smokeless tobacco: Never  Vaping Use   Vaping Use: Never used  Substance Use Topics   Alcohol use: No   Drug use: No    Home Medications Prior to Admission medications   Medication Sig Start Date End Date Taking? Authorizing Provider  acetaminophen (TYLENOL) 500 MG tablet Take 650 mg by mouth daily as needed for moderate pain. 02/07/12  Yes Hensel, Jamal Collin, MD   cholecalciferol (VITAMIN D) 1000 units tablet Take 1,000 Units by mouth daily.   Yes [provider]  cloNIDine (CATAPRES) 0.2 MG tablet Take 0.2 mg by mouth 3 (three) times daily. 05/11/21  Yes [provider]  diltiazem (CARDIZEM CD) 180 MG 24 hr capsule Take 180 mg by mouth daily.   Yes [provider]  furosemide (LASIX) 40 MG tablet Take 40 mg by mouth daily.   Yes [provider]  hydrOXYzine (ATARAX/VISTARIL) 25 MG tablet Take 25 mg by mouth daily. 05/08/21  Yes [provider]  insulin glargine (LANTUS) 100 UNIT/ML injection Inject 50 Units into the skin daily.   Yes [provider]  insulin lispro (HUMALOG) 100 UNIT/ML cartridge Inject 16 Units into the skin 3 (three) times daily with meals.   Yes [provider]  Insulin Pen Needle (PEN NEEDLES) 31G X 6 MM MISC Use 1 needle per injection as directed 07/01/13  Yes Patrecia Pour, MD  leflunomide (ARAVA) 20 MG tablet Take 20 mg by mouth daily.   Yes [provider]  lisinopril (ZESTRIL) 20 MG tablet    Yes [provider]  Multiple Vitamins-Minerals (CENTRUM SILVER 50+MEN) TABS Take 1 tablet by mouth daily.   Yes [provider]  Omega-3 Fatty Acids (OMEGA 3 500 PO) Take 1 capsule by mouth daily.   Yes [provider]  predniSONE (DELTASONE) 10 MG tablet Take 10 mg by mouth daily as needed (arthritis).  06/21/17  Yes [provider]  sodium bicarbonate 650 MG tablet Take 650 mg by mouth daily.    Yes [provider]  sucralfate (CARAFATE) 1 g tablet Take 1 tablet (1 g total) by mouth 4 (four) times daily -  with meals and at bedtime. 06/01/21  Yes Isla Pence, MD  TRULICITY 1.5 NW/2.9FA SOPN Inject 0.5 mLs as directed See admin instructions. Every Monday 08/13/16  Yes [provider]  UNIFINE PENTIPS 31G X 5 MM MISC USE AS DIRECTED WITH INSULIN   Yes Waldemar Dickens, MD  atorvastatin (LIPITOR) 40 MG tablet Take 40 mg  by mouth daily. Patient not taking: No sig reported 05/08/21   [provider]  ferrous sulfate (FERROUSUL) 325 (65 FE) MG tablet Take 1 tablet (325 mg total) by mouth daily with breakfast. Patient not taking: No sig reported 09/29/16   Elgergawy, Silver Huguenin, MD  insulin aspart (NOVOLOG) 100 UNIT/ML injection Inject 16 Units into the skin 3 (three) times daily before meals. Patient not taking: No sig reported    [provider]  oxyCODONE (OXY IR/ROXICODONE) 5 MG immediate release tablet Take 1 tablet (5 mg total) by mouth every 4 (four) hours as  needed for moderate pain. Patient not taking: No sig reported 10/29/17   Donnie Mesa, MD  pantoprazole (PROTONIX) 40 MG tablet Take 1 tablet (40 mg total) by mouth daily. 06/01/21   Isla Pence, MD  predniSONE (DELTASONE) 20 MG tablet Take 20 mg by mouth daily. Patient not taking: No sig reported 05/08/21   [provider]  traMADol (ULTRAM) 50 MG tablet 1 tablet as needed Patient not taking: No sig reported    [provider]    Allergies    Glyburide and Methotrexate derivatives  Review of Systems   Review of Systems  Gastrointestinal:  Positive for abdominal pain.       Dysphagia  All other systems reviewed and are negative.  Physical Exam Updated Vital Signs BP 125/64   Pulse 67   Temp 98.7 F (37.1 C) (Oral)   Resp 16   Ht 6' 1.5" (1.867 m)   Wt 100.2 kg   SpO2 99%   BMI 28.76 kg/m   Physical Exam Vitals and nursing note reviewed.  Constitutional:      Appearance: He is well-developed.  HENT:     Head: Normocephalic and atraumatic.     Mouth/Throat:     Mouth: Mucous membranes are moist.     Pharynx: Oropharynx is clear.  Eyes:     Extraocular Movements: Extraocular movements intact.     Pupils: Pupils are equal, round, and reactive to light.  Cardiovascular:     Rate and Rhythm: Normal rate and regular rhythm.     Heart sounds: Normal heart sounds.  Pulmonary:     Effort: Pulmonary  effort is normal.     Breath sounds: Normal breath sounds.  Abdominal:     General: Abdomen is flat. Bowel sounds are normal.     Palpations: Abdomen is soft.     Tenderness: There is abdominal tenderness in the epigastric area.  Musculoskeletal:     Comments: LUE AVF (not in use)  Skin:    General: Skin is warm.     Capillary Refill: Capillary refill takes less than 2 seconds.  Neurological:     General: No focal deficit present.     Mental Status: He is alert and oriented to person, place, and time.  Psychiatric:        Mood and Affect: Mood normal.        Behavior: Behavior normal.    ED Results / Procedures / Treatments   Labs (all labs ordered are listed, but only abnormal results are displayed) Labs Reviewed  CBC WITH DIFFERENTIAL/PLATELET - Abnormal; Notable for the following components:      Result Value   Hemoglobin 11.6 (*)    HCT 37.1 (*)    All other components within normal limits  COMPREHENSIVE METABOLIC PANEL - Abnormal; Notable for the following components:   Glucose, Bld 127 (*)    Creatinine, Ser 1.93 (*)    Calcium 8.6 (*)    Albumin 3.2 (*)    GFR, Estimated 37 (*)    All other components within normal limits  URINALYSIS, ROUTINE W REFLEX MICROSCOPIC - Abnormal; Notable for the following components:   Protein, ur >=300 (*)    All other components within normal limits  LIPASE, BLOOD    EKG None  Radiology CT ABDOMEN PELVIS WO CONTRAST  Result Date: 06/01/2021 CLINICAL DATA:  Abdominal pain EXAM: CT ABDOMEN AND PELVIS WITHOUT CONTRAST TECHNIQUE: Multidetector CT imaging of the abdomen and pelvis was performed following the standard protocol without  IV contrast. COMPARISON:  07/18/2017 FINDINGS: Lower chest: Nodular scarring at the right lung base is stable since prior study. No acute abnormality. Hepatobiliary: No focal liver abnormality is seen. Status post cholecystectomy. No biliary dilatation. Pancreas: No focal abnormality or ductal dilatation.  Spleen: No focal abnormality.  Normal size. Adrenals/Urinary Tract: Stable mild bilateral perinephric stranding. No renal or ureteral stones. No hydronephrosis. Adrenal glands and urinary bladder unremarkable. Stomach/Bowel: Normal appendix. Stomach, large and small bowel grossly unremarkable. Vascular/Lymphatic: No evidence of aneurysm or adenopathy. Reproductive: No visible focal abnormality. Other: No free fluid or free air. Moderate left inguinal hernia containing fat, stable since prior study. Musculoskeletal: No acute bony abnormality. IMPRESSION: No acute findings in the abdomen or pelvis. Stable bilateral perinephric stranding.  No hydronephrosis. Left inguinal hernia containing fat, stable. Electronically Signed   By: Rolm Baptise M.D.   On: 06/01/2021 10:54    Procedures Procedures   Medications Ordered in ED Medications  alum & mag hydroxide-simeth (MAALOX/MYLANTA) 200-200-20 MG/5ML suspension 30 mL (30 mLs Oral Given 06/01/21 1032)    And  lidocaine (XYLOCAINE) 2 % viscous mouth solution 15 mL (15 mLs Oral Given 06/01/21 1032)  famotidine (PEPCID) IVPB 20 mg premix (0 mg Intravenous Stopped 06/01/21 1215)    ED Course  I have reviewed the triage vital signs and the nursing notes.  Pertinent labs & imaging results that were available during my care of the patient were reviewed by me and considered in my medical decision making (see chart for details).    MDM Rules/Calculators/A&P                           Labs unremarkable for anything acute.  Pt asks to be referred to GI in [place], so I put in a referral to [place].    Pt will be put on carafate and protonix.  Return if worse.   Final Clinical Impression(s) / ED Diagnoses Final diagnoses:  Dysphagia, unspecified type    Rx / DC Orders ED Discharge Orders          Ordered    Ambulatory referral to Gastroenterology       Comments: dysphagia   06/01/21 1225    sucralfate (CARAFATE) 1 g tablet  3 times daily with meals &  bedtime        06/01/21 1226    pantoprazole (PROTONIX) 40 MG tablet  Daily        06/01/21 1226             Isla Pence, MD 06/01/21 1251

## 2021-06-01 NOTE — ED Notes (Signed)
Pt stated he is unable to give a urine sample at this time.

## 2021-06-04 DIAGNOSIS — I10 Essential (primary) hypertension: Secondary | ICD-10-CM | POA: Diagnosis not present

## 2021-06-05 ENCOUNTER — Encounter: Payer: Self-pay | Admitting: Gastroenterology

## 2021-06-05 ENCOUNTER — Ambulatory Visit: Payer: Medicare Other | Admitting: Gastroenterology

## 2021-06-05 VITALS — BP 138/72 | HR 82 | Ht 73.5 in | Wt 221.2 lb

## 2021-06-05 DIAGNOSIS — R1310 Dysphagia, unspecified: Secondary | ICD-10-CM

## 2021-06-05 NOTE — Patient Instructions (Signed)
If you are age 71 or older, your body mass index should be between 23-30. Your Body mass index is 28.79 kg/m. If this is out of the aforementioned range listed, please consider follow up with your Primary Care Provider. __________________________________________________________  The [facility] providers would like to encourage you to use [facility] to communicate with providers for non-urgent requests or questions.  Due to long hold times on the telephone, sending your provider a message by [facility] may be a faster and more efficient way to get a response.  Please allow 48 business hours for a response.  Please remember that this is for non-urgent requests.   You have been scheduled for an endoscopy. Please follow written instructions given to you at your visit today. If you use inhalers (even only as needed), please bring them with you on the day of your procedure.  Due to recent changes in healthcare laws, you may see the results of your imaging and laboratory studies on MyChart before your provider has had a chance to review them.  We understand that in some cases there may be results that are confusing or concerning to you. Not all laboratory results come back in the same time frame and the provider may be waiting for multiple results in order to interpret others.  Please give [place] 48 hours in order for your provider to thoroughly review all the results before contacting the office for clarification of your results.   Thank you for entrusting me with your care and choosing [facility].  Dr Ardis Hughs

## 2021-06-05 NOTE — Progress Notes (Signed)
HPI: This is a very pleasant 70 year old man who was referred to me by Celene Squibb, MD  to evaluate dysphagia.    He has had progressive dysphagia for the past 1 to 2 months associated with weight loss.  This is solids only.  He feels food get hung in his lower sternal border.  He has lost 7 pounds over the past month or 2.  Liquids go down fine.  He does also have heartburn.  He has Protonix on his medicine list he does think that he takes this every day.  Esophageal cancer does not run in his family.   Old Data Reviewed:  Blood work September 2022 shows normal comprehensive metabolic profile except for creatinine 1.9 UA normal except for protein in her urine.  CBC normal except for hemoglobin 11.6, normocytic.  Lipase 32 which is normal.  CT scan abdomen pelvis without IV contrast September 2022 indication "abdominal pain" findings "no acute findings in the abdomen or pelvis.  Stable bilateral perinephric stranding.  No hydronephrosis.  Left inguinal hernia containing fat, stable."  Most recent hemoglobin A1c is from 3 years ago and it was 10.2.  Out-of-control diabetes.   Review of systems: Pertinent positive and negative review of systems were noted in the above HPI section. All other review negative.   Past Medical History:  Diagnosis Date   Chronic cholecystitis    Chronic kidney disease    Diabetes mellitus without complication (HCC)    Type II   Hypertension    Rheumatoid arthritis ([place])    Vitamin D deficiency    Hx: of    Past Surgical History:  Procedure Laterality Date   AV FISTULA PLACEMENT Left 06/28/2013   Procedure: ARTERIOVENOUS (AV) FISTULA CREATION- Left arm with ultrasound guidance;  Surgeon: Rosetta Posner, MD;  Location: [place];  Service: Vascular;  Laterality: Left;   CHOLECYSTECTOMY N/A 10/28/2017   Procedure: LAPAROSCOPIC CHOLECYSTECTOMY WITH INTRAOPERATIVE CHOLANGIOGRAM;  Surgeon: Donnie Mesa, MD;  Location: [place];  Service: General;   Laterality: N/A;   COLONOSCOPY     hx: of   HERNIA REPAIR     INSERTION OF DIALYSIS CATHETER N/A 06/28/2013   Procedure: INSERTION OF DIALYSIS CATHETER;  Surgeon: Rosetta Posner, MD;  Location: [place];  Service: Vascular;  Laterality: N/A;   IR GENERIC HISTORICAL  09/08/2016   IR PERC CHOLECYSTOSTOMY 09/08/2016 [name], MD [facility]   IR GENERIC HISTORICAL  10/15/2016   IR RADIOLOGIST EVAL & MGMT 10/15/2016 Ardis Rowan, PA-C [facility]   IR GENERIC HISTORICAL  10/29/2016   IR RADIOLOGIST EVAL & MGMT 10/29/2016 Jacqulynn Cadet, MD [facility]   LIGATION OF COMPETING BRANCHES OF ARTERIOVENOUS FISTULA Left 10/12/2013   Procedure: LIGATION OF COMPETING BRANCHES OF ARTERIOVENOUS FISTULA;  Surgeon: Elam Dutch, MD;  [facility];  Service: Vascular;  Laterality: Left;   neck surgery      Current Outpatient Medications  Medication Sig Dispense Refill   acetaminophen (TYLENOL) 500 MG tablet Take 650 mg by mouth daily as needed for moderate pain.     atorvastatin (LIPITOR) 40 MG tablet Take 40 mg by mouth daily.     cholecalciferol (VITAMIN D) 1000 units tablet Take 1,000 Units by mouth daily.     cloNIDine (CATAPRES) 0.2 MG tablet Take 0.2 mg by mouth 3 (three) times daily.     diltiazem (CARDIZEM CD) 180 MG 24 hr capsule Take 180 mg by mouth daily.  ferrous sulfate (FERROUSUL) 325 (65 FE) MG tablet Take 1 tablet (325 mg total) by mouth daily with breakfast. 30 tablet 3   furosemide (LASIX) 40 MG tablet Take 40 mg by mouth daily.     hydrOXYzine (ATARAX/VISTARIL) 25 MG tablet Take 25 mg by mouth daily.     insulin aspart (NOVOLOG) 100 UNIT/ML injection Inject 16 Units into the skin 3 (three) times daily before meals.     insulin glargine (LANTUS) 100 UNIT/ML injection Inject 50 Units into the skin daily.     insulin lispro (HUMALOG) 100 UNIT/ML cartridge Inject 16 Units into the skin 3 (three) times daily with meals.     Insulin Pen Needle (PEN NEEDLES) 31G X 6 MM  MISC Use 1 needle per injection as directed 100 each 1   leflunomide (ARAVA) 20 MG tablet Take 20 mg by mouth daily.     lisinopril (ZESTRIL) 20 MG tablet      Multiple Vitamins-Minerals (CENTRUM SILVER 50+MEN) TABS Take 1 tablet by mouth daily.     Omega-3 Fatty Acids (OMEGA 3 500 PO) Take 1 capsule by mouth daily.     oxyCODONE (OXY IR/ROXICODONE) 5 MG immediate release tablet Take 1 tablet (5 mg total) by mouth every 4 (four) hours as needed for moderate pain. 15 tablet 0   pantoprazole (PROTONIX) 40 MG tablet Take 1 tablet (40 mg total) by mouth daily. 30 tablet 0   predniSONE (DELTASONE) 10 MG tablet Take 10 mg by mouth daily as needed (arthritis).      sodium bicarbonate 650 MG tablet Take 650 mg by mouth daily.      sucralfate (CARAFATE) 1 g tablet Take 1 tablet (1 g total) by mouth 4 (four) times daily -  with meals and at bedtime. 90 tablet 0   traMADol (ULTRAM) 50 MG tablet 1 tablet as needed     TRULICITY 1.5 YI/5.0YD SOPN Inject 0.5 mLs as directed See admin instructions. Every Monday     UNIFINE PENTIPS 31G X 5 MM MISC USE AS DIRECTED WITH INSULIN 100 each PRN   No current facility-administered medications for this visit.    Allergies as of 06/05/2021 - Review Complete 06/05/2021  Allergen Reaction Noted   Glyburide  06/18/2013   Methotrexate derivatives  06/18/2013    Family History  Problem Relation Age of Onset   Cancer Mother    Arthritis/Rheumatoid Sister    Diabetes Child        4   Colon polyps Neg Hx    Rectal cancer Neg Hx     Social History   Socioeconomic History   Marital status: Married    Spouse name: Not on file   Number of children: Not on file   Years of education: Not on file   Highest education level: Not on file  Occupational History   Not on file  Tobacco Use   Smoking status: Former    Packs/day: 0.50    Years: 10.00    Pack years: 5.00    Types: Cigarettes    Quit date: 09/02/1976    Years since quitting: 44.7   Smokeless tobacco:  Never  Vaping Use   Vaping Use: Never used  Substance and Sexual Activity   Alcohol use: No   Drug use: No   Sexual activity: Not on file  Other Topics Concern   Not on file  Social History Narrative   Not on file   Social Determinants of Health   Financial Resource Strain: Not  on file  Food Insecurity: Not on file  Transportation Needs: Not on file  Physical Activity: Not on file  Stress: Not on file  Social Connections: Not on file  Intimate Partner Violence: Not on file    Physical Exam: Ht 6' 1.5" (1.867 m)   Wt 221 lb 4 oz (100.4 kg)   BMI 28.79 kg/m  Constitutional: generally well-appearing Psychiatric: alert and oriented x3 Eyes: extraocular movements intact Mouth: oral pharynx moist, no lesions Neck: supple no lymphadenopathy Cardiovascular: heart regular rate and rhythm Lungs: clear to auscultation bilaterally Abdomen: soft, nontender, nondistended, no obvious ascites, no peritoneal signs, normal bowel sounds Extremities: no lower extremity edema bilaterally Skin: no lesions on visible extremities   Assessment and plan: 70 y.o. male with progressive solid food dysphagia associated weight loss and GERD  I have concern that he might have underlying malignancy and I recommended upper endoscopy at his soonest convenience.  We are looking at tomorrow morning for this EGD.  I see no reason for any further blood tests or imaging studies prior to then.  Please see the "Patient Instructions" section for addition details about the plan.   Owens Loffler, MD [facility] 06/05/2021, 10:13 AM  Cc: Celene Squibb, MD  Total time on date of encounter was 45 minutes (this included time spent preparing to see the patient reviewing records; obtaining and/or reviewing separately obtained history; performing a medically appropriate exam and/or evaluation; counseling and educating the patient and family if present; ordering medications, tests or procedures if  applicable; and documenting clinical information in the health record).

## 2021-06-06 ENCOUNTER — Encounter: Payer: Self-pay | Admitting: Gastroenterology

## 2021-06-06 ENCOUNTER — Ambulatory Visit (AMBULATORY_SURGERY_CENTER): Payer: Medicare Other | Admitting: Gastroenterology

## 2021-06-06 ENCOUNTER — Other Ambulatory Visit: Payer: Self-pay

## 2021-06-06 VITALS — BP 147/80 | HR 76 | Temp 97.8°F | Resp 16 | Ht 73.0 in | Wt 221.0 lb

## 2021-06-06 DIAGNOSIS — K208 Other esophagitis without bleeding: Secondary | ICD-10-CM | POA: Diagnosis not present

## 2021-06-06 DIAGNOSIS — K2950 Unspecified chronic gastritis without bleeding: Secondary | ICD-10-CM

## 2021-06-06 DIAGNOSIS — K219 Gastro-esophageal reflux disease without esophagitis: Secondary | ICD-10-CM | POA: Diagnosis not present

## 2021-06-06 DIAGNOSIS — R1310 Dysphagia, unspecified: Secondary | ICD-10-CM | POA: Diagnosis not present

## 2021-06-06 MED ORDER — SODIUM CHLORIDE 0.9 % IV SOLN: 500.0000 mL | INTRAVENOUS | Status: DC

## 2021-06-06 NOTE — Op Note (Signed)
[place] Patient Name: Gary Lang Procedure Date: 06/06/2021 7:21 AM MRN: [id number] Endoscopist: Milus Banister , MD Age: 70 Referring MD:  Date of Birth: 1951/03/14 Gender: Male Account #: [id number] Procedure:                Upper GI endoscopy Indications:              Dysphagia, weight loss, generalized abdominal pain Medicines:                Monitored Anesthesia Care Procedure:                Pre-Anesthesia Assessment:                           - Prior to the procedure, a History and Physical                            was performed, and patient medications and                            allergies were reviewed. The patient's tolerance of                            previous anesthesia was also reviewed. The risks                            and benefits of the procedure and the sedation                            options and risks were discussed with the patient.                            All questions were answered, and informed consent                            was obtained. Prior Anticoagulants: The patient has                            taken no previous anticoagulant or antiplatelet                            agents. ASA Grade Assessment: III - A patient with                            severe systemic disease. After reviewing the risks                            and benefits, the patient was deemed in                            satisfactory condition to undergo the procedure.                           After obtaining informed consent, the endoscope was  passed under direct vision. Throughout the                            procedure, the patient's blood pressure, pulse, and                            oxygen saturations were monitored continuously. The                            Endoscope was introduced through the mouth, and                            advanced to the second part of duodenum. The upper                            GI  endoscopy was accomplished without difficulty.                            The patient tolerated the procedure well. Scope In: Scope Out: Findings:                 Slightly abnormal GE junction,                            non-circumferentially nodular, irregular appearing                            mucosa for 1/3 the circumferences, measuring                            1-1.2cm across. Edema vs. neoplasm. Biopsies taken.                           Mild inflammation characterized by erythema and                            granularity was found in the gastric antrum.                            Biopsies were taken with a cold forceps for                            histology.                           The exam was otherwise without abnormality. Complications:            No immediate complications. Estimated blood loss:                            None. Estimated Blood Loss:     Estimated blood loss: none. Impression:               - Slightly irregular GE junction, biopsied                            extensively.                           -  Mild, non-specific gastritis. Biopsied.                           - The examination was otherwise normal. Recommendation:           - Patient has a contact number available for                            emergencies. The signs and symptoms of potential                            delayed complications were discussed with the                            patient. Return to normal activities tomorrow.                            Written discharge instructions were provided to the                            patient.                           - Resume previous diet.                           - Continue present medications.                           - Await pathology results. Milus Banister, MD 06/06/2021 8:46:08 AM This report has been signed electronically.

## 2021-06-06 NOTE — Progress Notes (Signed)
  The recent H&P (dated yesterday) was reviewed, the patient was examined and there is no change in the patients condition since that H&P was completed.   Gary Lang  06/06/2021, 8:05 AM

## 2021-06-06 NOTE — Progress Notes (Signed)
Report given to PACU, vss 

## 2021-06-06 NOTE — Progress Notes (Signed)
Pt's states no medical or surgical changes since previsit or office visit. 

## 2021-06-06 NOTE — Addendum Note (Signed)
Addended by: Wendie Chess on: 06/06/2021 11:53 AM   Modules accepted: Orders

## 2021-06-06 NOTE — Addendum Note (Signed)
Addended by: Wendie Chess on: 06/06/2021 11:50 AM   Modules accepted: Orders

## 2021-06-06 NOTE — Addendum Note (Signed)
Addended by: Wendie Chess on: 06/06/2021 12:00 PM   Modules accepted: Orders

## 2021-06-06 NOTE — Progress Notes (Signed)
Called to room to assist during endoscopic procedure.  Patient ID and intended procedure confirmed with present staff. Received instructions for my participation in the procedure from the performing physician.  

## 2021-06-06 NOTE — Patient Instructions (Signed)
Thank you for allowing [place] to care for you today! Resume previous diet and medications today.  Return to your normal daily activities tomorrow. We should have biopsy results in 1-2 days and we will call you with results at that time.   YOU HAD AN ENDOSCOPIC PROCEDURE TODAY AT [facility]:   Refer to the procedure report that was given to you for any specific questions about what was found during the examination.  If the procedure report does not answer your questions, please call your gastroenterologist to clarify.  If you requested that your care partner not be given the details of your procedure findings, then the procedure report has been included in a sealed envelope for you to review at your convenience later.  YOU SHOULD EXPECT: Some feelings of bloating in the abdomen. Passage of more gas than usual.  Walking can help get rid of the air that was put into your GI tract during the procedure and reduce the bloating. If you had a lower endoscopy (such as a colonoscopy or flexible sigmoidoscopy) you may notice spotting of blood in your stool or on the toilet paper. If you underwent a bowel prep for your procedure, you may not have a normal bowel movement for a few days.  Please Note:  You might notice some irritation and congestion in your nose or some drainage.  This is from the oxygen used during your procedure.  There is no need for concern and it should clear up in a day or so.  SYMPTOMS TO REPORT IMMEDIATELY:   Following upper endoscopy (EGD)  Vomiting of blood or coffee ground material  New chest pain or pain under the shoulder blades  Painful or persistently difficult swallowing  New shortness of breath  Fever of 100F or higher  Black, tarry-looking stools  For urgent or emergent issues, a gastroenterologist can be reached at any hour by calling [phone]. Do not use MyChart messaging for urgent concerns.    DIET:  We do recommend a small meal at first, but  then you may proceed to your regular diet.  Drink plenty of fluids but you should avoid alcoholic beverages for 24 hours.  ACTIVITY:  You should plan to take it easy for the rest of today and you should NOT DRIVE or use heavy machinery until tomorrow (because of the sedation medicines used during the test).    FOLLOW UP: Our staff will call the number listed on your records 48-72 hours following your procedure to check on you and address any questions or concerns that you may have regarding the information given to you following your procedure. If we do not reach you, we will leave a message.  We will attempt to reach you two times.  During this call, we will ask if you have developed any symptoms of COVID 19. If you develop any symptoms (ie: fever, flu-like symptoms, shortness of breath, cough etc.) before then, please call [phone].  If you test positive for Covid 19 in the 2 weeks post procedure, please call and report this information to [place].    If any biopsies were taken you will be contacted by phone or by letter within the next 1-3 weeks.  Please call us at [phone] if you have not heard about the biopsies in 3 weeks.    SIGNATURES/CONFIDENTIALITY: You and/or your care partner have signed paperwork which will be entered into your electronic medical record.  These signatures attest to the fact that that  the information above on your After Visit Summary has been reviewed and is understood.  Full responsibility of the confidentiality of this discharge information lies with you and/or your care-partner.

## 2021-06-06 NOTE — H&P (View-Only) (Signed)
  The recent H&P (dated yesterday) was reviewed, the patient was examined and there is no change in the patients condition since that H&P was completed.   Gary Lang  06/06/2021, 8:05 AM

## 2021-06-06 NOTE — Progress Notes (Signed)
4497 Robinul 0.1 mg IV given due large amount of secretions upon assessment.  MD made aware, vss

## 2021-06-07 ENCOUNTER — Ambulatory Visit: Payer: Medicare Other | Admitting: Cardiology

## 2021-06-08 ENCOUNTER — Telehealth: Payer: Self-pay

## 2021-06-08 NOTE — Telephone Encounter (Signed)
Called # [phone] and left a message we tried to reach pt for a follow up call. maw

## 2021-06-08 NOTE — Telephone Encounter (Signed)
  Follow up Call-  Call back number 06/06/2021  Post procedure Call Back phone  # [phone]  Permission to leave phone message Yes  Some recent data might be hidden     Patient questions:  Do you have a fever, pain , or abdominal swelling? No. Pain Score  0 *  Have you tolerated food without any problems? Yes.    Have you been able to return to your normal activities? Yes.    Do you have any questions about your discharge instructions: Diet   No. Medications  No. Follow up visit  No.  Do you have questions or concerns about your Care? No.  Actions: * If pain score is 4 or above: No action needed, pain <4.   Have you developed a fever since your procedure? no  2.   Have you had an respiratory symptoms (SOB or cough) since your procedure? no  3.   Have you tested positive for COVID 19 since your procedure no  4.   Have you had any family members/close contacts diagnosed with the COVID 19 since your procedure?  no   If yes to any of these questions please route to Joylene John, RN and Joella Prince, RN

## 2021-06-11 ENCOUNTER — Other Ambulatory Visit: Payer: Self-pay

## 2021-06-11 DIAGNOSIS — I129 Hypertensive chronic kidney disease with stage 1 through stage 4 chronic kidney disease, or unspecified chronic kidney disease: Secondary | ICD-10-CM | POA: Diagnosis not present

## 2021-06-11 DIAGNOSIS — N1832 Chronic kidney disease, stage 3b: Secondary | ICD-10-CM | POA: Diagnosis not present

## 2021-06-11 DIAGNOSIS — N051 Unspecified nephritic syndrome with focal and segmental glomerular lesions: Secondary | ICD-10-CM | POA: Diagnosis not present

## 2021-06-11 DIAGNOSIS — E872 Acidosis, unspecified: Secondary | ICD-10-CM | POA: Diagnosis not present

## 2021-06-11 DIAGNOSIS — D631 Anemia in chronic kidney disease: Secondary | ICD-10-CM | POA: Diagnosis not present

## 2021-06-11 DIAGNOSIS — I770 Arteriovenous fistula, acquired: Secondary | ICD-10-CM | POA: Diagnosis not present

## 2021-06-11 DIAGNOSIS — N2581 Secondary hyperparathyroidism of renal origin: Secondary | ICD-10-CM | POA: Diagnosis not present

## 2021-06-11 DIAGNOSIS — Z23 Encounter for immunization: Secondary | ICD-10-CM | POA: Diagnosis not present

## 2021-06-11 MED ORDER — PANTOPRAZOLE SODIUM 40 MG PO TBEC: 40.0000 mg | DELAYED_RELEASE_TABLET | Freq: Two times a day (BID) | ORAL | 2 refills | Status: AC

## 2021-06-13 ENCOUNTER — Telehealth: Payer: Self-pay | Admitting: Gastroenterology

## 2021-06-13 DIAGNOSIS — R1310 Dysphagia, unspecified: Secondary | ICD-10-CM

## 2021-06-13 NOTE — Telephone Encounter (Signed)
The pt states that his dysphagia has not improved since he was seen for procedure.  I asked if he had increased his protonix to twice daily and he tells me that he has not.  I have advised him to take his PPI BID (script was sent).  He agrees- I have also asked him to chew his food well, take small bites and avoid tough meats, etc.  He will also keep his appt with Dr Ardis Hughs on 11/9.  He will call back if things worsen prior to that appt.  FYI Dr Ardis Hughs any further reqs?

## 2021-06-13 NOTE — Telephone Encounter (Signed)
Pt states that he still has troubles swallowing. He would like some advise.

## 2021-06-19 NOTE — Telephone Encounter (Signed)
The pt states that he has not had any improvements in symptoms with BID PPI.  He states he is miserable and would like something done.

## 2021-06-19 NOTE — Telephone Encounter (Signed)
Patient returned your call.  Please call him back when you can.

## 2021-06-19 NOTE — Telephone Encounter (Signed)
The pt has been advised of the appt and clear liquids after 12 noon on 10/21.  He will arrive at 3:45 pm at [facility] radiology.  I did have the pt and his wife verbalize understanding of all the information.

## 2021-06-19 NOTE — Telephone Encounter (Signed)
The pt has been scheduled for CT chest at [facility] on 06/22/21 at 4 pm to arrive at 345 pm.  Clear liquids only 4 hours prior.

## 2021-06-19 NOTE — Telephone Encounter (Signed)
Left message on machine to call back  

## 2021-06-22 ENCOUNTER — Other Ambulatory Visit: Payer: Self-pay

## 2021-06-22 ENCOUNTER — Ambulatory Visit (HOSPITAL_COMMUNITY)
Admission: RE | Admit: 2021-06-22 | Discharge: 2021-06-22 | Disposition: A | Discharge status: Home / Self Care | Payer: Medicare Other | Source: Ambulatory Visit | Attending: Gastroenterology | Admitting: Gastroenterology

## 2021-06-22 DIAGNOSIS — I700 Atherosclerosis of aorta: Secondary | ICD-10-CM | POA: Diagnosis not present

## 2021-06-22 DIAGNOSIS — C3490 Malignant neoplasm of unspecified part of unspecified bronchus or lung: Secondary | ICD-10-CM | POA: Diagnosis not present

## 2021-06-22 DIAGNOSIS — R1310 Dysphagia, unspecified: Secondary | ICD-10-CM | POA: Insufficient documentation

## 2021-06-22 MED ORDER — IOHEXOL 350 MG/ML SOLN
60.0000 mL | Freq: Once | INTRAVENOUS | Status: AC | PRN
  Administered 2021-06-22: 45 mL via INTRAVENOUS

## 2021-06-24 DIAGNOSIS — N1831 Chronic kidney disease, stage 3a: Secondary | ICD-10-CM | POA: Insufficient documentation

## 2021-06-24 NOTE — Progress Notes (Addendum)
Cardiology Office Note   Date:  06/25/2021   ID:  Gary HOLLIMAN Sr., DOB [birth year], MRN [id number]  PCP:  Celene Squibb, MD  Cardiologist:   Minus Breeding, MD   Chief Complaint  Patient presents with   Palpitations       History of Present Illness: Gary L Moris Sr. is a 70 y.o. male who presents for follow up of atrial fib.   There was a question of this after pneumonia.  I have not seen him in greater than 3 years.    She is referred back because of palpitations.  He really cannot tell me exactly how long this has been going on.  He describes a fluttering in his chest.  He does not really describe discomfort and in fact denies chest pressure, neck or arm discomfort.  He has had no PND or orthopnea.  He said no presyncope or syncope.  He does not recall the events around his questionable atrial fibrillation previously.  He has been worked up recently for difficulty swallowing and unintentional weight loss.  He has had EGD and had a CT. he is results not back yet.  I did look at the CT images and reviewed the EGD.  There is no clear diagnosis yet.  He is not overly active though he does putter around with his lawnmower.  He takes the garbage cans out to the road.  With this he cannot bring on any of his symptoms.  He gets a little short of breath but this seems to be baseline.   Past Medical History:  Diagnosis Date   CHF (congestive heart failure) (HCC)    Chronic cholecystitis    Chronic kidney disease    Diabetes mellitus without complication (HCC)    Type II   Hypertension    Rheumatoid arthritis ([place])    Vitamin D deficiency    Hx: of    Past Surgical History:  Procedure Laterality Date   AV FISTULA PLACEMENT Left 06/28/2013   Procedure: ARTERIOVENOUS (AV) FISTULA CREATION- Left arm with ultrasound guidance;  Surgeon: Rosetta Posner, MD;  Location: [place];  Service: Vascular;  Laterality: Left;   CHOLECYSTECTOMY N/A 10/28/2017   Procedure: LAPAROSCOPIC  CHOLECYSTECTOMY WITH INTRAOPERATIVE CHOLANGIOGRAM;  Surgeon: Donnie Mesa, MD;  Location: [place];  Service: General;  Laterality: N/A;   COLONOSCOPY     hx: of   HERNIA REPAIR     INSERTION OF DIALYSIS CATHETER N/A 06/28/2013   Procedure: INSERTION OF DIALYSIS CATHETER;  Surgeon: Rosetta Posner, MD;  Location: [place];  Service: Vascular;  Laterality: N/A;   IR GENERIC HISTORICAL  09/08/2016   IR PERC CHOLECYSTOSTOMY 09/08/2016 [name], MD [facility]   IR GENERIC HISTORICAL  10/15/2016   IR RADIOLOGIST EVAL & MGMT 10/15/2016 Ardis Rowan, PA-C [facility]   IR GENERIC HISTORICAL  10/29/2016   IR RADIOLOGIST EVAL & MGMT 10/29/2016 Jacqulynn Cadet, MD [facility]   LIGATION OF COMPETING BRANCHES OF ARTERIOVENOUS FISTULA Left 10/12/2013   Procedure: LIGATION OF COMPETING BRANCHES OF ARTERIOVENOUS FISTULA;  Surgeon: Elam Dutch, MD;  Location: [facility];  Service: Vascular;  Laterality: Left;   neck surgery       Current Outpatient Medications  Medication Sig Dispense Refill   acetaminophen (TYLENOL) 500 MG tablet Take 650 mg by mouth daily as needed for moderate pain.     atorvastatin (LIPITOR) 40 MG tablet Take 40 mg by mouth daily.  cholecalciferol (VITAMIN D) 1000 units tablet Take 1,000 Units by mouth daily.     cloNIDine (CATAPRES) 0.2 MG tablet Take 0.2 mg by mouth 3 (three) times daily.     diltiazem (CARDIZEM CD) 180 MG 24 hr capsule Take 180 mg by mouth daily.     ferrous sulfate (FERROUSUL) 325 (65 FE) MG tablet Take 1 tablet (325 mg total) by mouth daily with breakfast. 30 tablet 3   furosemide (LASIX) 40 MG tablet Take 40 mg by mouth daily.     hydrOXYzine (ATARAX/VISTARIL) 25 MG tablet Take 25 mg by mouth daily.     insulin aspart (NOVOLOG) 100 UNIT/ML injection Inject 16 Units into the skin 3 (three) times daily before meals.     insulin glargine (LANTUS) 100 UNIT/ML injection Inject 50 Units into the skin daily.     insulin lispro (HUMALOG) 100 UNIT/ML  cartridge Inject 16 Units into the skin 3 (three) times daily with meals.     Insulin Pen Needle (PEN NEEDLES) 31G X 6 MM MISC Use 1 needle per injection as directed 100 each 1   leflunomide (ARAVA) 20 MG tablet Take 20 mg by mouth daily.     lisinopril (ZESTRIL) 20 MG tablet      Multiple Vitamins-Minerals (CENTRUM SILVER 50+MEN) TABS Take 1 tablet by mouth daily.     Omega-3 Fatty Acids (OMEGA 3 500 PO) Take 1 capsule by mouth daily.     oxyCODONE (OXY IR/ROXICODONE) 5 MG immediate release tablet Take 1 tablet (5 mg total) by mouth every 4 (four) hours as needed for moderate pain. 15 tablet 0   pantoprazole (PROTONIX) 40 MG tablet Take 1 tablet (40 mg total) by mouth 2 (two) times daily. 60 tablet 2   predniSONE (DELTASONE) 10 MG tablet Take 10 mg by mouth daily as needed (arthritis).      sodium bicarbonate 650 MG tablet Take 650 mg by mouth daily.      sucralfate (CARAFATE) 1 g tablet Take 1 tablet (1 g total) by mouth 4 (four) times daily -  with meals and at bedtime. 90 tablet 0   traMADol (ULTRAM) 50 MG tablet 1 tablet as needed     TRULICITY 1.5 DV/7.6HY SOPN Inject 0.5 mLs as directed See admin instructions. Every Monday     UNIFINE PENTIPS 31G X 5 MM MISC USE AS DIRECTED WITH INSULIN 100 each PRN   No current facility-administered medications for this visit.    Allergies:   Glyburide and Methotrexate derivatives    ROS:  Please see the history of present illness.   Otherwise, review of systems are positive for GERD and itching.   All other systems are reviewed and negative.    PHYSICAL EXAM: VS:  BP 132/78   Pulse 87   Ht 6\' 1"  (1.854 m)   Wt 213 lb (96.6 kg)   SpO2 97%   BMI 28.10 kg/m  , BMI Body mass index is 28.1 kg/m. GENERAL:  Well appearing NECK:  No jugular venous distention, waveform within normal limits, carotid upstroke brisk and symmetric, no bruits, no thyromegaly LUNGS:  Clear to auscultation bilaterally CHEST:  Unremarkable HEART:  PMI not displaced or  sustained,S1 and S2 within normal limits, no S4, no clicks, no rubs, no murmurs ABD:  Flat, positive bowel sounds normal in frequency in pitch, no bruits, no rebound, no guarding, no midline pulsatile mass, no hepatomegaly, no splenomegaly EXT:  2 plus pulses throughout, no leg edema, no cyanosis no clubbing, left arm  dialysis fistula  EKG:  EKG is  ordered today. Sinus rhythm, rate 87, axis within normal limits, intervals within normal limits, low voltage in the limb leads, nonspecific T wave flattening.   Recent Labs: 06/01/2021: ALT 16; BUN 14; Creatinine, Ser 1.93; Hemoglobin 11.6; Platelets 200; Potassium 3.6; Sodium 138    Lipid Panel    Component Value Date/Time   CHOL 325 (H) 06/19/2013 0538   TRIG 145 06/19/2013 0538   HDL 46 06/19/2013 0538   CHOLHDL 7.1 06/19/2013 0538   VLDL 29 06/19/2013 0538   LDLCALC 250 (H) 06/19/2013 0538      Wt Readings from Last 3 Encounters:  06/25/21 213 lb (96.6 kg)  06/06/21 221 lb (100.2 kg)  06/05/21 221 lb 4 oz (100.4 kg)      Other studies Reviewed: Additional studies/ records that were reviewed today include: GI records and recent labs. Review of the above records demonstrates:  Please see elsewhere in the note.     ASSESSMENT AND PLAN:  PALPITATIONS:   I do not see a TSH.  I will obtain 1.  I am going to have him wear a 4-week event monitor.   Further management will be based on these results.    HTN:  The blood pressure is at target.  No change in therapy.   CKD III:   His creatinine is 1.93.  He had previously been on dialysis.   DM: His A1c is 8.2 and not at target.  I will defer to Celene Squibb, MD   Current medicines are reviewed at length with the patient today.  The patient does not have concerns regarding medicines.  The following changes have been made:  None  Labs/ tests ordered today include:   Orders Placed This Encounter  Procedures   TSH   CARDIAC EVENT MONITOR   EKG 12-Lead      Disposition:    FU with APP in six weeks.     Signed, Minus Breeding, MD  06/25/2021 8:50 AM    [facility]

## 2021-06-25 ENCOUNTER — Telehealth: Payer: Self-pay | Admitting: Gastroenterology

## 2021-06-25 ENCOUNTER — Encounter: Payer: Self-pay | Admitting: Cardiology

## 2021-06-25 ENCOUNTER — Ambulatory Visit: Payer: Medicare Other | Admitting: Cardiology

## 2021-06-25 ENCOUNTER — Other Ambulatory Visit: Payer: Self-pay

## 2021-06-25 ENCOUNTER — Encounter: Payer: Self-pay | Admitting: *Deleted

## 2021-06-25 VITALS — BP 132/78 | HR 87 | Ht 73.0 in | Wt 213.0 lb

## 2021-06-25 DIAGNOSIS — N1831 Chronic kidney disease, stage 3a: Secondary | ICD-10-CM | POA: Diagnosis not present

## 2021-06-25 DIAGNOSIS — I480 Paroxysmal atrial fibrillation: Secondary | ICD-10-CM | POA: Diagnosis not present

## 2021-06-25 DIAGNOSIS — I10 Essential (primary) hypertension: Secondary | ICD-10-CM | POA: Diagnosis not present

## 2021-06-25 LAB — TSH: TSH: 2.37 u[IU]/mL (ref 0.450–4.500)

## 2021-06-25 NOTE — Progress Notes (Signed)
Patient ID: Gary Noe Sr., male   DOB: [birth year], 70 y.o.   MRN: [id number] Patient enrolled for [employer] to ship a 30 day cardiac event monitor to his address on file.

## 2021-06-25 NOTE — Patient Instructions (Signed)
Medication Instructions:  Your physician recommends that you continue on your current medications as directed. Please refer to the Current Medication list given to you today.   *If you need a refill on your cardiac medications before your next appointment, please call your pharmacy*  Lab Work: TSH TODAY   If you have labs (blood work) drawn today and your tests are completely normal, you will receive your results only by: [place] (if you have MyChart) OR A paper copy in the mail If you have any lab test that is abnormal or we need to change your treatment, we will call you to review the results.  Testing/Procedures: Your physician has recommended that you wear an event monitor. Event monitors are medical devices that record the heart's electrical activity. Doctors most often [place] these monitors to diagnose arrhythmias. Arrhythmias are problems with the speed or rhythm of the heartbeat. The monitor is a small, portable device. You can wear one while you do your normal daily activities. This is usually used to diagnose what is causing palpitations/syncope (passing out).  Follow-Up: At [facility], you and your health needs are our priority.  As part of our continuing mission to provide you with exceptional heart care, we have created designated Provider Care Teams.  These Care Teams include your primary Cardiologist (physician) and Advanced Practice Providers (APPs -  Physician Assistants and Nurse Practitioners) who all work together to provide you with the care you need, when you need it.  We recommend signing up for the patient portal called "MyChart".  Sign up information is provided on this After Visit Summary.  MyChart is used to connect with patients for Virtual Visits (Telemedicine).  Patients are able to view lab/test results, encounter notes, upcoming appointments, etc.  Non-urgent messages can be sent to your provider as well.   To learn more about what you can do with MyChart,  go to [email or web address].    Your next appointment:   6-7 week(s)  The format for your next appointment:   In Person  Provider:   You will see one of the following Advanced Practice Providers on your designated Care Team:   Rosaria Ferries, PA-C Caron Presume, PA-C Jory Sims, DNP, ANP  Other Instructions  [employer] Cardiac Event Monitor Instructions  Your physician has requested you wear your cardiac event monitor for _ 30_ days, (1-30). [employer] may call or text to confirm a shipping address. The monitor will be sent to a land address via UPS. [employer] will not ship a monitor to a PO BOX. It typically takes 3-5 days to receive your monitor after it has been enrolled. [employer] will assist with USPS tracking if your package is delayed. The telephone number for [employer] is [phone]. Once you have received your monitor, please review the enclosed instructions. Instruction tutorials can also be viewed under help and settings on the enclosed cell phone. Your monitor has already been registered assigning a specific monitor serial # to you.  Applying the monitor Remove cell phone from case and turn it on. The cell phone works as Dealer and needs to be within [employer] of you at all times. The cell phone will need to be charged on a daily basis. We recommend you plug the cell phone into the enclosed charger at your bedside table every night.  Monitor batteries: You will receive two monitor batteries labelled #1 and #2. These are your recorders. Plug battery #2 onto the second connection on the enclosed charger. Keep one battery  on the charger at all times. This will keep the monitor battery deactivated. It will also keep it fully charged for when you need to switch your monitor batteries. A small light will be blinking on the battery emblem when it is charging. The light on the battery emblem will remain on when the battery is fully charged.  Open  package of a Monitor strip. Insert battery #1 into black hood on strip and gently squeeze monitor battery onto connection as indicated in instruction booklet. Set aside while preparing skin.  Choose location for your strip, vertical or horizontal, as indicated in the instruction booklet. Shave to remove all hair from location. There cannot be any lotions, oils, powders, or colognes on skin where monitor is to be applied. Wipe skin clean with enclosed Saline wipe. Dry skin completely.  Peel paper labeled #1 off the back of the Monitor strip exposing the adhesive. Place the monitor on the chest in the vertical or horizontal position shown in the instruction booklet. One arrow on the monitor strip must be pointing upward. Carefully remove paper labeled #2, attaching remainder of strip to your skin. Try not to create any folds or wrinkles in the strip as you apply it.  Firmly press and release the circle in the center of the monitor battery. You will hear a small beep. This is turning the monitor battery on. The heart emblem on the monitor battery will light up every 5 seconds if the monitor battery in turned on and connected to the patient securely. Do not push and hold the circle down as this turns the monitor battery off. The cell phone will locate the monitor battery. A screen will appear on the cell phone checking the connection of your monitor strip. This may read poor connection initially but change to good connection within the next minute. Once your monitor accepts the connection you will hear a series of 3 beeps followed by a climbing crescendo of beeps. A screen will appear on the cell phone showing the two monitor strip placement options. Touch the picture that demonstrates where you applied the monitor strip.  Your monitor strip and battery are waterproof. You are able to shower, bathe, or swim with the monitor on. They just ask you do not submerge deeper than 3 feet underwater. We  recommend removing the monitor if you are swimming in a lake, river, or ocean.  Your monitor battery will need to be switched to a fully charged monitor battery approximately once a week. The cell phone will alert you of an action which needs to be made.  On the cell phone, tap for details to reveal connection status, monitor battery status, and cell phone battery status. The green dots indicates your monitor is in good status. A red dot indicates there is something that needs your attention.  To record a symptom, click the circle on the monitor battery. In 30-60 seconds a list of symptoms will appear on the cell phone. Select your symptom and tap save. Your monitor will record a sustained or significant arrhythmia regardless of you clicking the button. Some patients do not feel the heart rhythm irregularities. [employer] will notify us of any serious or critical events.  Refer to instruction booklet for instructions on switching batteries, changing strips, the Do not disturb or Pause features, or any additional questions.  Call [employer] at [phone], to confirm your monitor is transmitting and record your baseline. They will answer any questions you may have regarding the monitor instructions at  that time.  Returning the monitor to [place] all equipment back into blue box. Peel off strip of paper to expose adhesive and close box securely. There is a prepaid UPS shipping label on this box. Drop in a UPS drop box, or at a UPS facility like [employer]. You may also contact [employer] to arrange UPS to pick up monitor package at your home.

## 2021-06-26 ENCOUNTER — Other Ambulatory Visit: Payer: Self-pay

## 2021-06-26 DIAGNOSIS — L508 Other urticaria: Secondary | ICD-10-CM | POA: Diagnosis not present

## 2021-06-26 DIAGNOSIS — L308 Other specified dermatitis: Secondary | ICD-10-CM | POA: Diagnosis not present

## 2021-06-26 DIAGNOSIS — R1310 Dysphagia, unspecified: Secondary | ICD-10-CM

## 2021-06-27 ENCOUNTER — Encounter: Payer: Self-pay | Admitting: *Deleted

## 2021-06-28 ENCOUNTER — Ambulatory Visit (HOSPITAL_COMMUNITY): Payer: Medicare Other | Admitting: Anesthesiology

## 2021-06-28 ENCOUNTER — Encounter (HOSPITAL_COMMUNITY)
Admission: RE | Disposition: A | Discharge status: Home / Self Care | Payer: Self-pay | Source: Home / Self Care | Attending: Gastroenterology

## 2021-06-28 ENCOUNTER — Encounter (HOSPITAL_COMMUNITY): Payer: Self-pay | Admitting: Gastroenterology

## 2021-06-28 ENCOUNTER — Ambulatory Visit (HOSPITAL_COMMUNITY)
Admission: RE | Admit: 2021-06-28 | Discharge: 2021-06-28 | Disposition: A | Discharge status: Home / Self Care | Payer: Medicare Other | Attending: Gastroenterology | Admitting: Gastroenterology

## 2021-06-28 ENCOUNTER — Other Ambulatory Visit: Payer: Self-pay

## 2021-06-28 DIAGNOSIS — K219 Gastro-esophageal reflux disease without esophagitis: Secondary | ICD-10-CM | POA: Insufficient documentation

## 2021-06-28 DIAGNOSIS — R1310 Dysphagia, unspecified: Secondary | ICD-10-CM | POA: Insufficient documentation

## 2021-06-28 DIAGNOSIS — E1122 Type 2 diabetes mellitus with diabetic chronic kidney disease: Secondary | ICD-10-CM | POA: Insufficient documentation

## 2021-06-28 DIAGNOSIS — K222 Esophageal obstruction: Secondary | ICD-10-CM | POA: Diagnosis not present

## 2021-06-28 DIAGNOSIS — Z7985 Long-term (current) use of injectable non-insulin antidiabetic drugs: Secondary | ICD-10-CM | POA: Diagnosis not present

## 2021-06-28 DIAGNOSIS — Z888 Allergy status to other drugs, medicaments and biological substances status: Secondary | ICD-10-CM | POA: Diagnosis not present

## 2021-06-28 DIAGNOSIS — Z87891 Personal history of nicotine dependence: Secondary | ICD-10-CM | POA: Insufficient documentation

## 2021-06-28 DIAGNOSIS — Z79899 Other long term (current) drug therapy: Secondary | ICD-10-CM | POA: Diagnosis not present

## 2021-06-28 DIAGNOSIS — Z794 Long term (current) use of insulin: Secondary | ICD-10-CM | POA: Insufficient documentation

## 2021-06-28 DIAGNOSIS — I509 Heart failure, unspecified: Secondary | ICD-10-CM | POA: Insufficient documentation

## 2021-06-28 DIAGNOSIS — N189 Chronic kidney disease, unspecified: Secondary | ICD-10-CM | POA: Insufficient documentation

## 2021-06-28 DIAGNOSIS — Z8261 Family history of arthritis: Secondary | ICD-10-CM | POA: Insufficient documentation

## 2021-06-28 DIAGNOSIS — K2970 Gastritis, unspecified, without bleeding: Secondary | ICD-10-CM | POA: Diagnosis not present

## 2021-06-28 DIAGNOSIS — K2289 Other specified disease of esophagus: Secondary | ICD-10-CM | POA: Insufficient documentation

## 2021-06-28 DIAGNOSIS — Z7952 Long term (current) use of systemic steroids: Secondary | ICD-10-CM | POA: Insufficient documentation

## 2021-06-28 DIAGNOSIS — M069 Rheumatoid arthritis, unspecified: Secondary | ICD-10-CM | POA: Insufficient documentation

## 2021-06-28 DIAGNOSIS — I480 Paroxysmal atrial fibrillation: Secondary | ICD-10-CM | POA: Diagnosis not present

## 2021-06-28 DIAGNOSIS — K3189 Other diseases of stomach and duodenum: Secondary | ICD-10-CM | POA: Diagnosis not present

## 2021-06-28 DIAGNOSIS — Z833 Family history of diabetes mellitus: Secondary | ICD-10-CM | POA: Diagnosis not present

## 2021-06-28 DIAGNOSIS — I130 Hypertensive heart and chronic kidney disease with heart failure and stage 1 through stage 4 chronic kidney disease, or unspecified chronic kidney disease: Secondary | ICD-10-CM | POA: Insufficient documentation

## 2021-06-28 DIAGNOSIS — C3490 Malignant neoplasm of unspecified part of unspecified bronchus or lung: Secondary | ICD-10-CM | POA: Diagnosis not present

## 2021-06-28 DIAGNOSIS — E559 Vitamin D deficiency, unspecified: Secondary | ICD-10-CM | POA: Diagnosis not present

## 2021-06-28 DIAGNOSIS — R933 Abnormal findings on diagnostic imaging of other parts of digestive tract: Secondary | ICD-10-CM | POA: Diagnosis not present

## 2021-06-28 HISTORY — PX: ESOPHAGOGASTRODUODENOSCOPY (EGD) WITH PROPOFOL: SHX5813

## 2021-06-28 HISTORY — PX: BIOPSY: SHX5522

## 2021-06-28 HISTORY — PX: FINE NEEDLE ASPIRATION: SHX5430

## 2021-06-28 HISTORY — PX: EUS: SHX5427

## 2021-06-28 LAB — GLUCOSE, CAPILLARY: Glucose-Capillary: 79 mg/dL (ref 70–99)

## 2021-06-28 SURGERY — ESOPHAGOGASTRODUODENOSCOPY (EGD) WITH PROPOFOL
Anesthesia: Monitor Anesthesia Care

## 2021-06-28 MED ORDER — SODIUM CHLORIDE 0.9 % IV SOLN: INTRAVENOUS | Status: DC

## 2021-06-28 MED ORDER — LACTATED RINGERS IV SOLN: INTRAVENOUS | Status: DC | PRN

## 2021-06-28 MED ORDER — ONDANSETRON HCL 4 MG/2ML IJ SOLN
INTRAMUSCULAR | Status: DC | PRN
  Administered 2021-06-28: 4 mg via INTRAVENOUS

## 2021-06-28 MED ORDER — PROPOFOL 500 MG/50ML IV EMUL
INTRAVENOUS | Status: AC
  Filled 2021-06-28: qty 50

## 2021-06-28 MED ORDER — PROPOFOL 500 MG/50ML IV EMUL
INTRAVENOUS | Status: DC | PRN
  Administered 2021-06-28: 125 ug/kg/min via INTRAVENOUS

## 2021-06-28 MED ORDER — PROPOFOL 10 MG/ML IV BOLUS
INTRAVENOUS | Status: AC
  Filled 2021-06-28: qty 20

## 2021-06-28 MED ORDER — PROPOFOL 10 MG/ML IV BOLUS
INTRAVENOUS | Status: DC | PRN
Start: 2021-06-28 — End: 2021-06-28
  Administered 2021-06-28 (×5): 20 mg via INTRAVENOUS

## 2021-06-28 MED ORDER — LIDOCAINE 2% (20 MG/ML) 5 ML SYRINGE
INTRAMUSCULAR | Status: DC | PRN
  Administered 2021-06-28: 60 mg via INTRAVENOUS

## 2021-06-28 SURGICAL SUPPLY — 15 items

## 2021-06-28 NOTE — Anesthesia Procedure Notes (Signed)
Procedure Name: MAC Date/Time: 06/28/2021 8:46 AM Performed by: Maxwell Caul, CRNA Pre-anesthesia Checklist: Patient identified, Emergency Drugs available, Suction available and Patient being monitored Oxygen Delivery Method: Simple face mask

## 2021-06-28 NOTE — Discharge Instructions (Signed)
YOU HAD AN ENDOSCOPIC PROCEDURE TODAY: Refer to the procedure report and other information in the discharge instructions given to you for any specific questions about what was found during the examination. If this information does not answer your questions, please call [place] office at [phone] to clarify.   YOU SHOULD EXPECT: Some feelings of bloating in the abdomen. Passage of more gas than usual. Walking can help get rid of the air that was put into your GI tract during the procedure and reduce the bloating. If you had a lower endoscopy (such as a colonoscopy or flexible sigmoidoscopy) you may notice spotting of blood in your stool or on the toilet paper. Some abdominal soreness may be present for a day or two, also.  DIET: Your first meal following the procedure should be a light meal and then it is ok to progress to your normal diet. A half-sandwich or bowl of soup is an example of a good first meal. Heavy or fried foods are harder to digest and may make you feel nauseous or bloated. Drink plenty of fluids but you should avoid alcoholic beverages for 24 hours. If you had a esophageal dilation, please see attached instructions for diet.    ACTIVITY: Your care partner should take you home directly after the procedure. You should plan to take it easy, moving slowly for the rest of the day. You can resume normal activity the day after the procedure however YOU SHOULD NOT DRIVE, use power tools, machinery or perform tasks that involve climbing or major physical exertion for 24 hours (because of the sedation medicines used during the test).   SYMPTOMS TO REPORT IMMEDIATELY: A gastroenterologist can be reached at any hour. Please call [phone]  for any of the following symptoms:   Following upper endoscopy (EGD, EUS, ERCP, esophageal dilation) Vomiting of blood or coffee ground material  New, significant abdominal pain  New, significant chest pain or pain under the shoulder blades  Painful or  persistently difficult swallowing  New shortness of breath  Black, tarry-looking or red, bloody stools  FOLLOW UP:  If any biopsies were taken you will be contacted by phone or by letter within the next 1-3 weeks. Call [phone]  if you have not heard about the biopsies in 3 weeks.  Please also call with any specific questions about appointments or follow up tests.  

## 2021-06-28 NOTE — Anesthesia Preprocedure Evaluation (Addendum)
Anesthesia Evaluation  Patient identified by MRN, date of birth, ID band Patient awake    Reviewed: Allergy & Precautions, NPO status , Patient's Chart, lab work & pertinent test results  Airway Mallampati: II  TM Distance: >3 FB     Dental  (+) Dental Advisory Given   Pulmonary former smoker,    breath sounds clear to auscultation       Cardiovascular hypertension, Pt. on medications +CHF   Rhythm:Regular Rate:Normal     Neuro/Psych negative neurological ROS     GI/Hepatic Neg liver ROS, GERD  ,  Endo/Other  diabetes, Type 2  Renal/GU Renal disease     Musculoskeletal  (+) Arthritis ,   Abdominal   Peds  Hematology negative hematology ROS (+)   Anesthesia Other Findings   Reproductive/Obstetrics                            Anesthesia Physical Anesthesia Plan  ASA: 3  Anesthesia Plan: MAC   Post-op Pain Management:    Induction:   PONV Risk Score and Plan: 1 and Propofol infusion, Ondansetron and Treatment may vary due to age or medical condition  Airway Management Planned: Natural Airway and Nasal Cannula  Additional Equipment:   Intra-op Plan:   Post-operative Plan:   Informed Consent: I have reviewed the patients History and Physical, chart, labs and discussed the procedure including the risks, benefits and alternatives for the proposed anesthesia with the patient or authorized representative who has indicated his/her understanding and acceptance.       Plan Discussed with: CRNA  Anesthesia Plan Comments:         Anesthesia Quick Evaluation

## 2021-06-28 NOTE — Transfer of Care (Signed)
Immediate Anesthesia Transfer of Care Note  Patient: Gary L Northrup Sr.  Procedure(s) Performed: ESOPHAGOGASTRODUODENOSCOPY (EGD) WITH PROPOFOL BIOPSY FINE NEEDLE ASPIRATION (FNA) LINEAR UPPER ENDOSCOPIC ULTRASOUND (EUS) LINEAR  Patient Location: PACU and Endoscopy Unit  Anesthesia Type:MAC  Level of Consciousness: awake, alert  and oriented  Airway & Oxygen Therapy: Patient Spontanous Breathing and Patient connected to face mask oxygen  Post-op Assessment: Report given to RN and Post -op Vital signs reviewed and stable  Post vital signs: Reviewed and stable  Last Vitals:  Vitals Value Taken Time  BP 103/46 06/28/21 0945  Temp    Pulse 83 06/28/21 0946  Resp 19 06/28/21 0946  SpO2 98 % 06/28/21 0946  Vitals shown include unvalidated device data.  Last Pain:  Vitals:   06/28/21 0802  TempSrc: Oral  PainSc: 0-No pain         Complications: No notable events documented.

## 2021-06-28 NOTE — Op Note (Addendum)
[facility] Patient Name: Gary Lang Procedure Date: 06/28/2021 MRN: [id number] Attending MD: Milus Banister , MD Date of Birth: [birth year] CSN: [id number] Age: 70 Admit Type: Outpatient Procedure:                Upper EUS Indications:              Recent dysphagia, weight loss led to EGD earlier                            this month. This showed fairly normal esophagus                            except for possible nodule at GE junction, biopsy                            of the nodule showed inflammation only. Biopsies                            from mild gastritis showed mild inflammation as                            well. Continued symptoms with intermittent                            dysphagia and postprandial abdominal discomforts.                            CT scan chest suggested abnormal esophagus as well                            without clear tumors or masses however. Providers:                Milus Banister, MD, [place], Hinton Dyer Referring MD:              Medicines:                Monitored Anesthesia Care Complications:            No immediate complications. Estimated blood loss:                            None. Estimated Blood Loss:     Estimated blood loss: none. Procedure:                Pre-Anesthesia Assessment:                           - Prior to the procedure, a History and Physical                            was performed, and patient medications and                            allergies were reviewed. The patient's tolerance of  previous anesthesia was also reviewed. The risks                            and benefits of the procedure and the sedation                            options and risks were discussed with the patient.                            All questions were answered, and informed consent                            was obtained. Prior Anticoagulants: The patient has                             taken no previous anticoagulant or antiplatelet                            agents. ASA Grade Assessment: III - A patient with                            severe systemic disease. After reviewing the risks                            and benefits, the patient was deemed in                            satisfactory condition to undergo the procedure.                           After obtaining informed consent, the endoscope was                            passed under direct vision. Throughout the                            procedure, the patient's blood pressure, pulse, and                            oxygen saturations were monitored continuously. The                            [id number]-[id number] ([id number]) Olympus endoscope was introduced                            through the mouth, and advanced to the second part                            of duodenum. The GF-[id number]-[id number] ([id number]) Olympus                            radial ultrasound scope was introduced through the  mouth, and advanced to the second part of duodenum.                            After obtaining informed consent, the endoscope was                            passed under direct vision. Throughout the                            procedure, the patient's blood pressure, pulse, and                            oxygen saturations were monitored continuously. The                            upper EUS was accomplished without difficulty. The                            patient tolerated the procedure well. Scope In: Scope Out: Findings:      ENDOSCOPIC FINDING: :      1. The esophagus was completely normal mucosally.      2. There was a very thin nonobstructive Schatzki's ring at the GE       junction and just below this there was a soft proximal gastric nodule       that measured about 1 to 1-1/2 cm across. I took extensive biopsies with       forceps from the nodule.      3. The upper GI tract was otherwise normal  endoscopically      EUS findings:      1. The distal third of the esophagus was nonspecifically       circumferentially thickened. This was anywhere from 8 mm to 1 cm. There       were no distinct masses. The thickening was most impressive on radial       endosonographic evaluation. The thickening was hypoechoic, deep mucosa       and submucosa. It did not appear to involve the muscularis propria.       Given his symptoms and these findings I elected to perform FNA of the       wall of the esophagus where it was thickened. I used a single pass with       a 25-gauge acquire EUS FNA needle. This resulted in scant sample. I was       very reluctant to try to sample the wall further for concern of causing       a tear or perforation.      2. Adjacent to the distal esophagus was a collection of 3 or 4 shoddy,       hypoechoic fairly round lymph nodes. The largest measured 1.2 cm across,       the whole collection was about 2 cm across. I sampled the largest lymph       node using a single pass with an expect 25-gauge EUS FNA needle.      3. Evaluation of the pancreas, stomach, biliary tree and limited views       of the liver were otherwise normal. Impression:               -  Thin nonobstructing Schatzki's ring at the GE                            junction.                           -Soft 1.5 cm proximal gastric nodule just below the                            GE junction was sampled with biopsy.                           -The distal third of the esophagus was                            nonspecifically, circumferentially thickened by                            EUS. The wall of the esophagus in this region was                            sampled with a single pass of an FNA needle.                            Sampling the wall of the esophagus is high risk and                            I elected to only perform it a single time. The                            aspirate was admittedly quite  scant.                           -Cluster of shotty appearing lymph nodes adjacent                            to the distal esophagus were sampled with a single                            pass of EUS FNA needle as well. Moderate Sedation:      Not Applicable - Patient had care per Anesthesia. Recommendation:           - Await final pathology and cytology results. If                            these are non-diagnostic and his symptoms continue                            (dysphagia has improved most, now with mainly post                            prandial abd pains) then he will need further  testing. PET scan vs. esophageal manometry? Perhaps                            this is a motility disorder? Procedure Code(s):        --- Professional ---                           [phone], Esophagogastroduodenoscopy, flexible,                            transoral; with biopsy, single or multiple Diagnosis Code(s):        --- Professional ---                           K29.70, Gastritis, unspecified, without bleeding                           R93.3, Abnormal findings on diagnostic imaging of                            other parts of digestive tract CPT copyright 2019 American Medical Association. All rights reserved. The codes documented in this report are preliminary and upon coder review may  be revised to meet current compliance requirements. Milus Banister, MD 06/28/2021 10:00:01 AM This report has been signed electronically. Number of Addenda: 0

## 2021-06-28 NOTE — Interval H&P Note (Signed)
History and Physical Interval Note:  06/28/2021 7:28 AM  Gary L Garlock Sr.  has presented today for surgery, with the diagnosis of dysphagia-lung cancer.  The various methods of treatment have been discussed with the patient and family. After consideration of risks, benefits and other options for treatment, the patient has consented to  Procedure(s): ESOPHAGOGASTRODUODENOSCOPY (EGD) WITH PROPOFOL (N/A) as a surgical intervention.  The patient's history has been reviewed, patient examined, no change in status, stable for surgery.  I have reviewed the patient's chart and labs.  Questions were answered to the patient's satisfaction.     Milus Banister

## 2021-06-29 ENCOUNTER — Encounter (HOSPITAL_COMMUNITY): Payer: Self-pay | Admitting: Gastroenterology

## 2021-06-29 LAB — SURGICAL PATHOLOGY

## 2021-06-29 NOTE — Anesthesia Postprocedure Evaluation (Signed)
Anesthesia Post Note  Patient: Gary L Labrake Sr.  Procedure(s) Performed: ESOPHAGOGASTRODUODENOSCOPY (EGD) WITH PROPOFOL BIOPSY FINE NEEDLE ASPIRATION (FNA) LINEAR UPPER ENDOSCOPIC ULTRASOUND (EUS) LINEAR     Patient location during evaluation: PACU Anesthesia Type: MAC Level of consciousness: awake and alert Pain management: pain level controlled Vital Signs Assessment: post-procedure vital signs reviewed and stable Respiratory status: spontaneous breathing, nonlabored ventilation, respiratory function stable and patient connected to nasal cannula oxygen Cardiovascular status: stable and blood pressure returned to baseline Postop Assessment: no apparent nausea or vomiting Anesthetic complications: no   No notable events documented.  Last Vitals:  Vitals:   06/28/21 1000 06/28/21 1010  BP: (!) 158/73 134/61  Pulse: 89 78  Resp: (!) 23 19  Temp:    SpO2: 94% 92%    Last Pain:  Vitals:   06/28/21 1010  TempSrc:   PainSc: 0-No pain                 Tiajuana Amass

## 2021-07-02 ENCOUNTER — Ambulatory Visit (INDEPENDENT_AMBULATORY_CARE_PROVIDER_SITE_OTHER): Payer: Medicare Other

## 2021-07-02 DIAGNOSIS — I480 Paroxysmal atrial fibrillation: Secondary | ICD-10-CM

## 2021-07-02 LAB — CYTOLOGY - NON PAP

## 2021-07-03 ENCOUNTER — Other Ambulatory Visit: Payer: Self-pay

## 2021-07-03 ENCOUNTER — Telehealth: Payer: Self-pay

## 2021-07-03 DIAGNOSIS — R933 Abnormal findings on diagnostic imaging of other parts of digestive tract: Secondary | ICD-10-CM

## 2021-07-03 DIAGNOSIS — R634 Abnormal weight loss: Secondary | ICD-10-CM

## 2021-07-03 DIAGNOSIS — R1310 Dysphagia, unspecified: Secondary | ICD-10-CM

## 2021-07-03 NOTE — Telephone Encounter (Signed)
-----   Message from Darden Dates sent at 07/03/2021  2:46 PM EDT ----- Case# [id number] in review. Records sent. ----- Message ----- From: Belva Chimes H Sent: 07/03/2021   1:32 PM EDT To: Greer Pickerel, [place], #   ----- Message ----- From: Timothy Lasso, RN Sent: 07/03/2021  12:14 PM EDT To: April H Pait, Amy L Haz[place]  No injections that I am aware of April.  Amy can you please see the note regarding the prior auth.  ----- Message ----- From: Sheldon Silvan, April H Sent: 07/03/2021  11:56 AM EDT To: Greer Pickerel, Timothy Lasso, RN, #  Murat Rideout please advise, ty. ----- Message ----- From: Greer Pickerel Sent: 07/03/2021  11:45 AM EDT To: April H Pait, Jerrye Noble, Leah K Bratton  Hello,  FYI- the NETSPOT PET scans are not offered at [facility] at this time.    Please provide [place] with the insurance authorization information for the PET scan and we will get the patient scheduled.  Pre-auth is required prior to scheduling of speciality PET scans.  Also, does the patient receive any sandostatin type injections?  Is so, when is his next injection scheduled?   Thank you,  Museum/gallery conservator ----- Message ----- From: Augustine Radar Sent: 07/03/2021  10:35 AM EDT To: Aleen Campi   ----- Message ----- From: Timothy Lasso, RN Sent: 07/03/2021  10:27 AM EDT To: April H Pait, Roosvelt Maser  Pt needs a PET scan please call thank you

## 2021-07-03 NOTE — Telephone Encounter (Signed)
Auth sent to the schedulers.

## 2021-07-05 NOTE — H&P (Signed)
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  Constitutional:  No unintentional weight loss   Past Medical History:  Diagnosis Date   CHF (congestive heart failure) (HCC)    Chronic cholecystitis    Chronic kidney disease    Diabetes mellitus without complication (HCC)    Type II   Hypertension    Rheumatoid arthritis ([place])    Vitamin D deficiency    Hx: of    Past Surgical History:  Procedure Laterality Date   AV FISTULA PLACEMENT Left 06/28/2013   Procedure: ARTERIOVENOUS (AV) FISTULA CREATION- Left arm with ultrasound guidance;  Surgeon: Rosetta Posner, MD;  Location: [place];  Service: Vascular;  Laterality: Left;   BIOPSY  06/28/2021   Procedure: BIOPSY;  Surgeon: Milus Banister, MD;  Location: [facility];  Service: Endoscopy;;   CHOLECYSTECTOMY N/A 10/28/2017   Procedure: LAPAROSCOPIC CHOLECYSTECTOMY WITH INTRAOPERATIVE CHOLANGIOGRAM;  Surgeon: Donnie Mesa, MD;  Location: [place];  Service: General;  Laterality: N/A;   COLONOSCOPY     hx: of   ESOPHAGOGASTRODUODENOSCOPY (EGD) WITH PROPOFOL N/A 06/28/2021   Procedure: ESOPHAGOGASTRODUODENOSCOPY (EGD) WITH PROPOFOL;  Surgeon: Milus Banister, MD;  [facility];  Service: Endoscopy;  Laterality: N/A;   EUS N/A 06/28/2021   Procedure: UPPER ENDOSCOPIC ULTRASOUND (EUS) LINEAR;  Surgeon: Milus Banister, MD;  [facility];  Service: Endoscopy;  Laterality: N/A;   FINE NEEDLE ASPIRATION N/A 06/28/2021   Procedure: FINE NEEDLE ASPIRATION (FNA) LINEAR;  Surgeon: Milus Banister, MD;  [facility];  Service: Endoscopy;  Laterality: N/A;   HERNIA REPAIR     INSERTION OF DIALYSIS CATHETER N/A 06/28/2013   Procedure: INSERTION OF DIALYSIS CATHETER;  Surgeon: Rosetta Posner, MD;  Location: [place];  Service: Vascular;  Laterality: N/A;   IR GENERIC HISTORICAL  09/08/2016   IR PERC CHOLECYSTOSTOMY 09/08/2016 [name], MD [facility]    IR GENERIC HISTORICAL  10/15/2016   IR RADIOLOGIST EVAL & MGMT 10/15/2016 Ardis Rowan, PA-C [facility]   IR GENERIC HISTORICAL  10/29/2016   IR RADIOLOGIST EVAL & MGMT 10/29/2016 Jacqulynn Cadet, MD [facility]   LIGATION OF COMPETING BRANCHES OF ARTERIOVENOUS FISTULA Left 10/12/2013   Procedure: LIGATION OF COMPETING BRANCHES OF ARTERIOVENOUS FISTULA;  Surgeon: Elam Dutch, MD;  Location[facility];  Service: Vascular;  Laterality: Left;   neck surgery      No current facility-administered medications for this encounter.   Current Outpatient Medications  Medication Sig Dispense Refill   acetaminophen (TYLENOL) 500 MG tablet Take 500-1,000 mg by mouth daily as needed for moderate pain.     atorvastatin (LIPITOR) 40 MG tablet Take 40 mg by mouth in the morning.     calcium carbonate (OSCAL) 1500 (600 Ca) MG TABS tablet Take 600 mg of elemental calcium by mouth in the morning.     Cholecalciferol (VITAMIN D3) 50 MCG (2000 UT) TABS Take 2,000 Units by mouth in the morning.     cloNIDine (CATAPRES) 0.2 MG tablet Take 0.2 mg by mouth 3 (three) times daily.     diltiazem (CARDIZEM CD) 180 MG 24 hr capsule Take 180 mg by mouth at bedtime.     insulin glargine (LANTUS) 100 UNIT/ML injection Inject 40 Units into the skin in the morning.     insulin lispro (HUMALOG) 100 UNIT/ML cartridge Inject 16 Units into the skin 2 (two) times daily as  needed (with lunch and supper if needed for low blood sugar).     Insulin Pen Needle (PEN NEEDLES) 31G X 6 MM MISC Use 1 needle per injection as directed 100 each 1   leflunomide (ARAVA) 20 MG tablet Take 20 mg by mouth in the morning.     Omega-3 Fatty Acids (FISH OIL) 1000 MG CAPS Take 1,000 mg by mouth in the morning.     pantoprazole (PROTONIX) 40 MG tablet Take 1 tablet (40 mg total) by mouth 2 (two) times daily. (Patient taking differently: Take 40 mg by mouth in the morning.) 60 tablet 2   predniSONE (DELTASONE) 10 MG tablet Take 10 mg by  mouth every other day.     sucralfate (CARAFATE) 1 g tablet Take 1 tablet (1 g total) by mouth 4 (four) times daily -  with meals and at bedtime. (Patient taking differently: Take 1 g by mouth See admin instructions. Take 1 tablet (1 g) up to 4 times daily) 90 tablet 0   triamcinolone cream (KENALOG) 0.1 % Apply 1 application topically 2 (two) times daily as needed (itching/irritation).     TRULICITY 1.5 GB/1.5VV SOPN Inject 1.5 mg into the skin every Wednesday.     UNIFINE PENTIPS 31G X 5 MM MISC USE AS DIRECTED WITH INSULIN 100 each PRN    Allergies as of 06/26/2021 - Review Complete 06/25/2021  Allergen Reaction Noted   Glyburide  06/18/2013   Methotrexate derivatives  06/18/2013    Family History  Problem Relation Age of Onset   Cancer Mother    Arthritis/Rheumatoid Sister    Diabetes Child        39   Colon polyps Neg Hx    Rectal cancer Neg Hx     Social History   Socioeconomic History   Marital status: Married    Spouse name: Not on file   Number of children: Not on file   Years of education: Not on file   Highest education level: Not on file  Occupational History   Not on file  Tobacco Use   Smoking status: Former    Packs/day: 0.50    Years: 10.00    Pack years: 5.00    Types: Cigarettes    Quit date: 09/02/1976    Years since quitting: 44.8   Smokeless tobacco: Never  Vaping Use   Vaping Use: Never used  Substance and Sexual Activity   Alcohol use: No   Drug use: No   Sexual activity: Not on file  Other Topics Concern   Not on file  Social History Narrative   Not on file   Social Determinants of Health   Financial Resource Strain: Not on file  Food Insecurity: Not on file  Transportation Needs: Not on file  Physical Activity: Not on file  Stress: Not on file  Social Connections: Not on file  Intimate Partner Violence: Not on file     Physical Exam: BP 134/61   Pulse 78   Temp 97.8 F (36.6 C) (Oral)   Resp 19   Ht 6\' 1"  (1.854 m)   Wt  96.6 kg   SpO2 92%   BMI 28.10 kg/m  Constitutional: generally well-appearing Psychiatric: alert and oriented x3 Abdomen: soft, nontender, nondistended, no obvious ascites, no peritoneal signs, normal bowel sounds No peripheral edema noted in lower extremities  Assessment and plan: 70 y.o. male with abnormal esophagus  EGD EUS  Please see the "Patient Instructions" section for addition details about the plan.  Owens Loffler, MD [facility] 07/05/2021, 7:20 AM

## 2021-07-05 NOTE — Interval H&P Note (Signed)
History and Physical Interval Note:  07/05/2021 7:18 AM  Gary L Chanda Sr.  has presented today for surgery, with the diagnosis of dysphagia-lung cancer.  The various methods of treatment have been discussed with the patient and family. After consideration of risks, benefits and other options for treatment, the patient has consented to  Procedure(s): ESOPHAGOGASTRODUODENOSCOPY (EGD) WITH PROPOFOL (N/A) BIOPSY FINE NEEDLE ASPIRATION (FNA) LINEAR (N/A) UPPER ENDOSCOPIC ULTRASOUND (EUS) LINEAR (N/A) as a surgical intervention.  The patient's history has been reviewed, patient examined, no change in status, stable for surgery.  I have reviewed the patient's chart and labs.  Questions were answered to the patient's satisfaction.     Milus Banister  HPI: This is a man with abdnmormal esophagus  ROS: complete GI ROS as described in HPI, all other review negative.  Constitutional:  No unintentional weight loss   Past Medical History:  Diagnosis Date   CHF (congestive heart failure) (HCC)    Chronic cholecystitis    Chronic kidney disease    Diabetes mellitus without complication (HCC)    Type II   Hypertension    Rheumatoid arthritis ([place])    Vitamin D deficiency    Hx: of    Past Surgical History:  Procedure Laterality Date   AV FISTULA PLACEMENT Left 06/28/2013   Procedure: ARTERIOVENOUS (AV) FISTULA CREATION- Left arm with ultrasound guidance;  Surgeon: Rosetta Posner, MD;  Location: [place];  Service: Vascular;  Laterality: Left;   BIOPSY  06/28/2021   Procedure: BIOPSY;  Surgeon: Milus Banister, MD;  Location[facility];  Service: Endoscopy;;   CHOLECYSTECTOMY N/A 10/28/2017   Procedure: LAPAROSCOPIC CHOLECYSTECTOMY WITH INTRAOPERATIVE CHOLANGIOGRAM;  Surgeon: Donnie Mesa, MD;  Location: [place];  Service: General;  Laterality: N/A;   COLONOSCOPY     hx: of   ESOPHAGOGASTRODUODENOSCOPY (EGD) WITH PROPOFOL N/A 06/28/2021   Procedure: ESOPHAGOGASTRODUODENOSCOPY (EGD) WITH  PROPOFOL;  Surgeon: Milus Banister, MD;  [facility];  Service: Endoscopy;  Laterality: N/A;   EUS N/A 06/28/2021   Procedure: UPPER ENDOSCOPIC ULTRASOUND (EUS) LINEAR;  Surgeon: Milus Banister, MD;  [facility];  Service: Endoscopy;  Laterality: N/A;   FINE NEEDLE ASPIRATION N/A 06/28/2021   Procedure: FINE NEEDLE ASPIRATION (FNA) LINEAR;  Surgeon: Milus Banister, MD;  [facility];  Service: Endoscopy;  Laterality: N/A;   HERNIA REPAIR     INSERTION OF DIALYSIS CATHETER N/A 06/28/2013   Procedure: INSERTION OF DIALYSIS CATHETER;  Surgeon: Rosetta Posner, MD;  Location: [place];  Service: Vascular;  Laterality: N/A;   IR GENERIC HISTORICAL  09/08/2016   IR PERC CHOLECYSTOSTOMY 09/08/2016 [name], MD [facility]   IR GENERIC HISTORICAL  10/15/2016   IR RADIOLOGIST EVAL & MGMT 10/15/2016 Ardis Rowan, PA-C [facility]   IR GENERIC HISTORICAL  10/29/2016   IR RADIOLOGIST EVAL & MGMT 10/29/2016 Jacqulynn Cadet, MD [facility]   LIGATION OF COMPETING BRANCHES OF ARTERIOVENOUS FISTULA Left 10/12/2013   Procedure: LIGATION OF COMPETING BRANCHES OF ARTERIOVENOUS FISTULA;  Surgeon: Elam Dutch, MD;  Location: [facility];  Service: Vascular;  Laterality: Left;   neck surgery      No current facility-administered medications for this encounter.   Current Outpatient Medications  Medication Sig Dispense Refill   acetaminophen (TYLENOL) 500 MG tablet Take 500-1,000 mg by mouth daily as needed for moderate pain.     atorvastatin (LIPITOR) 40 MG tablet Take 40 mg by mouth in the morning.  calcium carbonate (OSCAL) 1500 (600 Ca) MG TABS tablet Take 600 mg of elemental calcium by mouth in the morning.     Cholecalciferol (VITAMIN D3) 50 MCG (2000 UT) TABS Take 2,000 Units by mouth in the morning.     cloNIDine (CATAPRES) 0.2 MG tablet Take 0.2 mg by mouth 3 (three) times daily.     diltiazem (CARDIZEM CD) 180 MG 24 hr capsule Take 180 mg by mouth at  bedtime.     insulin glargine (LANTUS) 100 UNIT/ML injection Inject 40 Units into the skin in the morning.     insulin lispro (HUMALOG) 100 UNIT/ML cartridge Inject 16 Units into the skin 2 (two) times daily as needed (with lunch and supper if needed for low blood sugar).     Insulin Pen Needle (PEN NEEDLES) 31G X 6 MM MISC Use 1 needle per injection as directed 100 each 1   leflunomide (ARAVA) 20 MG tablet Take 20 mg by mouth in the morning.     Omega-3 Fatty Acids (FISH OIL) 1000 MG CAPS Take 1,000 mg by mouth in the morning.     pantoprazole (PROTONIX) 40 MG tablet Take 1 tablet (40 mg total) by mouth 2 (two) times daily. (Patient taking differently: Take 40 mg by mouth in the morning.) 60 tablet 2   predniSONE (DELTASONE) 10 MG tablet Take 10 mg by mouth every other day.     sucralfate (CARAFATE) 1 g tablet Take 1 tablet (1 g total) by mouth 4 (four) times daily -  with meals and at bedtime. (Patient taking differently: Take 1 g by mouth See admin instructions. Take 1 tablet (1 g) up to 4 times daily) 90 tablet 0   triamcinolone cream (KENALOG) 0.1 % Apply 1 application topically 2 (two) times daily as needed (itching/irritation).     TRULICITY 1.5 ZE/0.9QZ SOPN Inject 1.5 mg into the skin every Wednesday.     UNIFINE PENTIPS 31G X 5 MM MISC USE AS DIRECTED WITH INSULIN 100 each PRN    Allergies as of 06/26/2021 - Review Complete 06/25/2021  Allergen Reaction Noted   Glyburide  06/18/2013   Methotrexate derivatives  06/18/2013    Family History  Problem Relation Age of Onset   Cancer Mother    Arthritis/Rheumatoid Sister    Diabetes Child        25   Colon polyps Neg Hx    Rectal cancer Neg Hx     Social History   Socioeconomic History   Marital status: Married    Spouse name: Not on file   Number of children: Not on file   Years of education: Not on file   Highest education level: Not on file  Occupational History   Not on file  Tobacco Use   Smoking status: Former     Packs/day: 0.50    Years: 10.00    Pack years: 5.00    Types: Cigarettes    Quit date: 09/02/1976    Years since quitting: 44.8   Smokeless tobacco: Never  Vaping Use   Vaping Use: Never used  Substance and Sexual Activity   Alcohol use: No   Drug use: No   Sexual activity: Not on file  Other Topics Concern   Not on file  Social History Narrative   Not on file   Social Determinants of Health   Financial Resource Strain: Not on file  Food Insecurity: Not on file  Transportation Needs: Not on file  Physical Activity: Not on file  Stress: Not on file  Social Connections: Not on file  Intimate Partner Violence: Not on file     Physical Exam: BP 134/61   Pulse 78   Temp 97.8 F (36.6 C) (Oral)   Resp 19   Ht 6\' 1"  (1.854 m)   Wt 96.6 kg   SpO2 92%   BMI 28.10 kg/m  Constitutional: generally well-appearing Psychiatric: alert and oriented x3 Abdomen: soft, nontender, nondistended, no obvious ascites, no peritoneal signs, normal bowel sounds No peripheral edema noted in lower extremities  Assessment and plan: 70 y.o. male with abnormal esophagus  EGD, EUS   Please see the "Patient Instructions" section for addition details about the plan.  Owens Loffler, MD [facility] 07/05/2021, 7:19 AM

## 2021-07-09 ENCOUNTER — Telehealth: Payer: Self-pay

## 2021-07-09 DIAGNOSIS — E1122 Type 2 diabetes mellitus with diabetic chronic kidney disease: Secondary | ICD-10-CM | POA: Diagnosis not present

## 2021-07-09 DIAGNOSIS — K219 Gastro-esophageal reflux disease without esophagitis: Secondary | ICD-10-CM | POA: Diagnosis not present

## 2021-07-09 DIAGNOSIS — L509 Urticaria, unspecified: Secondary | ICD-10-CM | POA: Diagnosis not present

## 2021-07-09 DIAGNOSIS — M069 Rheumatoid arthritis, unspecified: Secondary | ICD-10-CM | POA: Diagnosis not present

## 2021-07-09 DIAGNOSIS — G4700 Insomnia, unspecified: Secondary | ICD-10-CM | POA: Diagnosis not present

## 2021-07-09 DIAGNOSIS — E782 Mixed hyperlipidemia: Secondary | ICD-10-CM | POA: Diagnosis not present

## 2021-07-09 NOTE — Telephone Encounter (Signed)
Dr Ardis Hughs per Amy Hazlewood the NET spot was denied by insurance.  Per the [employer] care fax received the pt will need to have the results of a tissue sample (biopsy) or other diagnostic testing that confirms or strongly suggest cancer diagnosis.  Please call [phone] select option #1 and enter reference # [id number] for peer to peer review.  Must be completed by 11/8.  (Member number if needed [phone])

## 2021-07-10 ENCOUNTER — Other Ambulatory Visit: Payer: Self-pay

## 2021-07-10 DIAGNOSIS — R634 Abnormal weight loss: Secondary | ICD-10-CM

## 2021-07-10 DIAGNOSIS — R933 Abnormal findings on diagnostic imaging of other parts of digestive tract: Secondary | ICD-10-CM

## 2021-07-10 NOTE — Telephone Encounter (Signed)
Can one of you let me know if this PET scan will be covered?  I ordered the wrong one initially and it was denied.  I have fixed the order.

## 2021-07-10 NOTE — Telephone Encounter (Signed)
Correct order for PET entered and sent to the schedulers.

## 2021-07-11 ENCOUNTER — Encounter: Payer: Self-pay | Admitting: Gastroenterology

## 2021-07-11 ENCOUNTER — Other Ambulatory Visit (INDEPENDENT_AMBULATORY_CARE_PROVIDER_SITE_OTHER): Payer: Medicare Other

## 2021-07-11 ENCOUNTER — Ambulatory Visit: Payer: Medicare Other | Admitting: Gastroenterology

## 2021-07-11 VITALS — BP 120/78 | HR 103 | Ht 73.0 in | Wt 208.0 lb

## 2021-07-11 DIAGNOSIS — R933 Abnormal findings on diagnostic imaging of other parts of digestive tract: Secondary | ICD-10-CM

## 2021-07-11 DIAGNOSIS — R634 Abnormal weight loss: Secondary | ICD-10-CM

## 2021-07-11 DIAGNOSIS — Z0389 Encounter for observation for other suspected diseases and conditions ruled out: Secondary | ICD-10-CM

## 2021-07-11 LAB — CBC
HCT: 36.7 % — ABNORMAL LOW (ref 39.0–52.0)
Hemoglobin: 11.8 g/dL — ABNORMAL LOW (ref 13.0–17.0)
MCHC: 32.3 g/dL (ref 30.0–36.0)
MCV: 82.9 fl (ref 78.0–100.0)
Platelets: 219 10*3/uL (ref 150.0–400.0)
RBC: 4.42 Mil/uL (ref 4.22–5.81)
RDW: 15.1 % (ref 11.5–15.5)
WBC: 7.9 10*3/uL (ref 4.0–10.5)

## 2021-07-11 LAB — COMPREHENSIVE METABOLIC PANEL
ALT: 11 U/L (ref 0–53)
AST: 14 U/L (ref 0–37)
Albumin: 3.7 g/dL (ref 3.5–5.2)
Alkaline Phosphatase: 55 U/L (ref 39–117)
BUN: 18 mg/dL (ref 6–23)
CO2: 28 mEq/L (ref 19–32)
Calcium: 8.4 mg/dL (ref 8.4–10.5)
Chloride: 106 mEq/L (ref 96–112)
Creatinine, Ser: 1.82 mg/dL — ABNORMAL HIGH (ref 0.40–1.50)
GFR: 36.9 mL/min — ABNORMAL LOW (ref >60.00)
Glucose, Bld: 104 mg/dL — ABNORMAL HIGH (ref 70–99)
Potassium: 3.7 mEq/L (ref 3.5–5.1)
Sodium: 141 mEq/L (ref 135–145)
Total Bilirubin: 0.5 mg/dL (ref 0.2–1.2)
Total Protein: 7 g/dL (ref 6.0–8.3)

## 2021-07-11 LAB — SEDIMENTATION RATE: Sed Rate: 39 mm/hr — ABNORMAL HIGH (ref 0–20)

## 2021-07-11 LAB — C-REACTIVE PROTEIN: CRP: 1.4 mg/dL (ref 0.5–20.0)

## 2021-07-11 NOTE — Telephone Encounter (Signed)
Use the info on the fax.

## 2021-07-11 NOTE — Telephone Encounter (Signed)
Ok, after spending over an hour being transferred all over and given so many different numbers to call at [facility] from their reps...  Per [facility] case# [id number]  Auth# [id number] was denied. Called [phone] appeals dept and spoke to [facility]. She started appeals case# [id number] set up expedited and will take 72 hours to process instead of standard 30 days. Fax number [phone]

## 2021-07-11 NOTE — Telephone Encounter (Signed)
See note below I sent it before I attached you to it.

## 2021-07-11 NOTE — Telephone Encounter (Signed)
Amy can you please assist with expedited appeal

## 2021-07-11 NOTE — Telephone Encounter (Signed)
The PET is different- the PET that was ordered lastly is the correct one.  The other is a specialized PET for only certain types of neuroendocrine tumors.

## 2021-07-11 NOTE — Telephone Encounter (Signed)
FYI Dr Jacobs  

## 2021-07-11 NOTE — Patient Instructions (Signed)
If you are age 71 or older, your body mass index should be between 23-30. Your Body mass index is 27.44 kg/m. If this is out of the aforementioned range listed, please consider follow up with your Primary Care Provider.  If you are age 1 or younger, your body mass index should be between 19-25. Your Body mass index is 27.44 kg/m. If this is out of the aformentioned range listed, please consider follow up with your Primary Care Provider.   ________________________________________________________  The [facility] providers would like to encourage you to use [facility] to communicate with providers for non-urgent requests or questions.  Due to long hold times on the telephone, sending your provider a message by [facility] may be a faster and more efficient way to get a response.  Please allow 48 business hours for a response.  Please remember that this is for non-urgent requests.  _______________________________________________________  Your provider has requested that you go to the basement level for lab work before leaving today. Press "B" on the elevator. The lab is located at the first door on the left as you exit the elevator.  Due to recent changes in healthcare laws, you may see the results of your imaging and laboratory studies on MyChart before your provider has had a chance to review them.  We understand that in some cases there may be results that are confusing or concerning to you. Not all laboratory results come back in the same time frame and the provider may be waiting for multiple results in order to interpret others.  Please give [place] 48 hours in order for your provider to thoroughly review all the results before contacting the office for clarification of your results.   Please take medications as we have previously prescribed.  Your PET scan has been denied.  Patty, RN will be working on an expedited appeals to attempt to get the PET scan approved.  Thank you for entrusting me with your  care and choosing [facility].  Dr Ardis Hughs

## 2021-07-11 NOTE — Telephone Encounter (Signed)
Dr Ardis Hughs per [place] Amy the PET still needs a peer to peer using the information below.    Dr Ardis Hughs per Amy Hazlewood the NET spot was denied by insurance.  Per the [employer] care fax received the pt will need to have the results of a tissue sample (biopsy) or other diagnostic testing that confirms or strongly suggest cancer diagnosis.  Please call [phone] select option #1 and enter reference # [id number] for peer to peer review.  Must be completed by 11/8.  (Member number if needed [phone])

## 2021-07-11 NOTE — Telephone Encounter (Signed)
The additional information was needed for the specialized PET.  The one that was correctly ordered does not use the same contrast.  The original order uses dototate that is very expensive.

## 2021-07-11 NOTE — Progress Notes (Signed)
HPI: This is a pleasant 70 year old man who is here with his wife today.  I explained to him that I just had an very unhelpful "peer-to-peer review" with his insurance company.  In the end they told me that it was not a formal peer-to-peer because the PET scan has been officially denied and a actual peer to peer review will not change that status.  Instead we need to formally appeal the decision and we are going to do that.  I let the patient know that his insurance company is denying to help him cover the test that I have recommended and they are offering no other suggestions clinically to help try to figure out why he is losing weight and having these GI symptoms.  The only thing they are saying is that we can appeal their decision formally.  He continues to complain of lower abdominal burning.  The dysphagia component is present but seems less than previously.  He is continuing to lose weight.  He has no abdominal pains.  He is obviously not taking his medicines correctly.  I will think he has been taking his Carafate at all and I think he is probably only been taking his proton pump inhibitor once daily.  His wife agrees.  He has lost 13 pounds since his last office visit here about 1 month ago.  ROS: complete GI ROS as described in HPI, all other review negative.  Constitutional:  No unintentional weight loss   Past Medical History:  Diagnosis Date   CHF (congestive heart failure) (HCC)    Chronic cholecystitis    Chronic kidney disease    Diabetes mellitus without complication (HCC)    Type II   Hypertension    Rheumatoid arthritis ([place])    Vitamin D deficiency    Hx: of    Past Surgical History:  Procedure Laterality Date   AV FISTULA PLACEMENT Left 06/28/2013   Procedure: ARTERIOVENOUS (AV) FISTULA CREATION- Left arm with ultrasound guidance;  Surgeon: Rosetta Posner, MD;  Location: [place];  Service: Vascular;  Laterality: Left;   BIOPSY  06/28/2021   Procedure: BIOPSY;   Surgeon: Milus Banister, MD;  Location: [facility];  Service: Endoscopy;;   CHOLECYSTECTOMY N/A 10/28/2017   Procedure: LAPAROSCOPIC CHOLECYSTECTOMY WITH INTRAOPERATIVE CHOLANGIOGRAM;  Surgeon: Donnie Mesa, MD;  Location: [place];  Service: General;  Laterality: N/A;   COLONOSCOPY     hx: of   ESOPHAGOGASTRODUODENOSCOPY (EGD) WITH PROPOFOL N/A 06/28/2021   Procedure: ESOPHAGOGASTRODUODENOSCOPY (EGD) WITH PROPOFOL;  Surgeon: Milus Banister, MD;  Location: [facility];  Service: Endoscopy;  Laterality: N/A;   EUS N/A 06/28/2021   Procedure: UPPER ENDOSCOPIC ULTRASOUND (EUS) LINEAR;  Surgeon: Milus Banister, MD;  Location: [facility];  Service: Endoscopy;  Laterality: N/A;   FINE NEEDLE ASPIRATION N/A 06/28/2021   Procedure: FINE NEEDLE ASPIRATION (FNA) LINEAR;  Surgeon: Milus Banister, MD;  Location: [facility];  Service: Endoscopy;  Laterality: N/A;   HERNIA REPAIR     INSERTION OF DIALYSIS CATHETER N/A 06/28/2013   Procedure: INSERTION OF DIALYSIS CATHETER;  Surgeon: Rosetta Posner, MD;  Location: [place];  Service: Vascular;  Laterality: N/A;   IR GENERIC HISTORICAL  09/08/2016   IR PERC CHOLECYSTOSTOMY 09/08/2016 [name], MD [facility]   IR GENERIC HISTORICAL  10/15/2016   IR RADIOLOGIST EVAL & MGMT 10/15/2016 Ardis Rowan, PA-C [facility]   IR GENERIC HISTORICAL  10/29/2016   IR RADIOLOGIST EVAL & MGMT 10/29/2016 Heath  Laurence Ferrari, MD [facility]   LIGATION OF COMPETING BRANCHES OF ARTERIOVENOUS FISTULA Left 10/12/2013   Procedure: LIGATION OF COMPETING BRANCHES OF ARTERIOVENOUS FISTULA;  Surgeon: Elam Dutch, MD;  [facility];  Service: Vascular;  Laterality: Left;   neck surgery      Current Outpatient Medications  Medication Sig Dispense Refill   acetaminophen (TYLENOL) 500 MG tablet Take 500-1,000 mg by mouth daily as needed for moderate pain.     atorvastatin (LIPITOR) 40 MG tablet Take 40 mg by mouth in the morning.     calcium carbonate  (OSCAL) 1500 (600 Ca) MG TABS tablet Take 600 mg of elemental calcium by mouth in the morning.     Cholecalciferol (VITAMIN D3) 50 MCG (2000 UT) TABS Take 2,000 Units by mouth every other day.     cloNIDine (CATAPRES) 0.2 MG tablet Take 0.2 mg by mouth 2 (two) times daily.     diltiazem (CARDIZEM CD) 180 MG 24 hr capsule Take 180 mg by mouth at bedtime.     insulin glargine (LANTUS) 100 UNIT/ML injection Inject 40 Units into the skin in the morning.     insulin lispro (HUMALOG) 100 UNIT/ML cartridge Inject 16 Units into the skin 2 (two) times daily as needed (with lunch and supper if needed for low blood sugar).     Insulin Pen Needle (PEN NEEDLES) 31G X 6 MM MISC Use 1 needle per injection as directed 100 each 1   leflunomide (ARAVA) 20 MG tablet Take 20 mg by mouth in the morning.     pantoprazole (PROTONIX) 40 MG tablet Take 1 tablet (40 mg total) by mouth 2 (two) times daily. (Patient taking differently: Take 40 mg by mouth in the morning.) 60 tablet 2   predniSONE (DELTASONE) 10 MG tablet Take 10 mg by mouth every other day.     sucralfate (CARAFATE) 1 g tablet Take 1 tablet (1 g total) by mouth 4 (four) times daily -  with meals and at bedtime. (Patient taking differently: Take 1 g by mouth as needed. Take 1 tablet (1 g) up to 4 times daily) 90 tablet 0   triamcinolone cream (KENALOG) 0.1 % Apply 1 application topically 2 (two) times daily as needed (itching/irritation).     TRULICITY 1.5 TU/8.8KC SOPN Inject 1.5 mg into the skin every Wednesday.     UNIFINE PENTIPS 31G X 5 MM MISC USE AS DIRECTED WITH INSULIN 100 each PRN   Omega-3 Fatty Acids (FISH OIL) 1000 MG CAPS Take 1,000 mg by mouth in the morning. (Patient not taking: Reported on 07/11/2021)     No current facility-administered medications for this visit.    Allergies as of 07/11/2021 - Review Complete 07/11/2021  Allergen Reaction Noted   Glyburide  06/18/2013   Metformin and related Other (See Comments) 06/27/2021    Methotrexate derivatives Other (See Comments) 06/18/2013   Orencia [abatacept] Other (See Comments) 06/27/2021    Family History  Problem Relation Age of Onset   Cancer Mother    Arthritis/Rheumatoid Sister    Diabetes Child        75   Colon polyps Neg Hx    Rectal cancer Neg Hx     Social History   Socioeconomic History   Marital status: Married    Spouse name: Not on file   Number of children: Not on file   Years of education: Not on file   Highest education level: Not on file  Occupational History   Not  on file  Tobacco Use   Smoking status: Former    Packs/day: 0.50    Years: 10.00    Pack years: 5.00    Types: Cigarettes    Quit date: 09/02/1976    Years since quitting: 44.8   Smokeless tobacco: Never  Vaping Use   Vaping Use: Never used  Substance and Sexual Activity   Alcohol use: No   Drug use: No   Sexual activity: Not on file  Other Topics Concern   Not on file  Social History Narrative   Not on file   Social Determinants of Health   Financial Resource Strain: Not on file  Food Insecurity: Not on file  Transportation Needs: Not on file  Physical Activity: Not on file  Stress: Not on file  Social Connections: Not on file  Intimate Partner Violence: Not on file     Physical Exam: Ht 6\' 1"  (1.854 m)   Wt 208 lb (94.3 kg)   BMI 27.44 kg/m  Constitutional: generally well-appearing Psychiatric: alert and oriented x3 Abdomen: soft, nontender, nondistended, no obvious ascites, no peritoneal signs, normal bowel sounds No peripheral edema noted in lower extremities  Assessment and plan: 70 y.o. male with abnormal esophagus on CT scan, ultrasound, dysphagia, unintentional weight loss, lower abdominal burning,  I reiterated that he needs to be taking his medicines reliably.  Specifically his Protonix needs to be twice daily and his Carafate should be 3-4 times daily.  I reassured her that we are pushing for his heart as possible to get test done to  try to figure out why he is losing weight and having trouble with his GI tract.  His insurance company has denied covering a PET scan.  We are going to appeal that process formally, in an expedited manner.  If his insurance company in the end continues to deny to cover that PET scan that I am recommending that I think you would need different type of imaging such as a repeat CT scan abdomen pelvis or perhaps MR.  Please see the "Patient Instructions" section for addition details about the plan.  Gary Loffler, MD [facility] 07/11/2021, 10:08 AM   Total time on date of encounter was 40 minutes (this included time spent preparing to see the patient reviewing records; obtaining and/or reviewing separately obtained history; performing a medically appropriate exam and/or evaluation; counseling and educating the patient and family if present; ordering medications, tests or procedures if applicable; and documenting clinical information in the health record).

## 2021-07-11 NOTE — Telephone Encounter (Signed)
I understand about using a different contrast, but the study still has the same CPT code no matter what contrast is used.   The reason it was denied is because there isn't a tissue sample that suggests the diagnosis of cancer.  That is the information from the fax. I can't submit another case with the same CPT code.  Did Dr. Ardis Hughs call?

## 2021-07-11 NOTE — Addendum Note (Signed)
Addended by: Azzie Almas on: 07/11/2021 10:36 AM   Modules accepted: Orders

## 2021-07-11 NOTE — Telephone Encounter (Signed)
If he needs to call them will there be another case number or should he use the same information?

## 2021-07-11 NOTE — Telephone Encounter (Signed)
The "new order" you put in is the same CPT code.  Dr. Ardis Hughs needed to call to provide additional info with the medical management team by 07/10/21 according to the fax I sent.  Did he call and get an authorization?

## 2021-07-11 NOTE — Telephone Encounter (Signed)
No because it is a different type of PET scan so he did not.

## 2021-07-12 LAB — TISSUE TRANSGLUTAMINASE, IGA: (tTG) Ab, IgA: <1 U/mL

## 2021-07-12 LAB — IGA: Immunoglobulin A: 282 mg/dL (ref 70–320)

## 2021-07-13 DIAGNOSIS — D72829 Elevated white blood cell count, unspecified: Secondary | ICD-10-CM | POA: Diagnosis not present

## 2021-07-13 DIAGNOSIS — R809 Proteinuria, unspecified: Secondary | ICD-10-CM | POA: Diagnosis not present

## 2021-07-13 DIAGNOSIS — D638 Anemia in other chronic diseases classified elsewhere: Secondary | ICD-10-CM | POA: Diagnosis not present

## 2021-07-13 DIAGNOSIS — E211 Secondary hyperparathyroidism, not elsewhere classified: Secondary | ICD-10-CM | POA: Diagnosis not present

## 2021-07-13 DIAGNOSIS — E1122 Type 2 diabetes mellitus with diabetic chronic kidney disease: Secondary | ICD-10-CM | POA: Diagnosis not present

## 2021-07-13 DIAGNOSIS — N184 Chronic kidney disease, stage 4 (severe): Secondary | ICD-10-CM | POA: Diagnosis not present

## 2021-07-13 DIAGNOSIS — I480 Paroxysmal atrial fibrillation: Secondary | ICD-10-CM | POA: Diagnosis not present

## 2021-07-13 DIAGNOSIS — K219 Gastro-esophageal reflux disease without esophagitis: Secondary | ICD-10-CM | POA: Diagnosis not present

## 2021-07-13 DIAGNOSIS — M069 Rheumatoid arthritis, unspecified: Secondary | ICD-10-CM | POA: Diagnosis not present

## 2021-07-13 DIAGNOSIS — E782 Mixed hyperlipidemia: Secondary | ICD-10-CM | POA: Diagnosis not present

## 2021-07-13 DIAGNOSIS — I10 Essential (primary) hypertension: Secondary | ICD-10-CM | POA: Diagnosis not present

## 2021-07-13 NOTE — Telephone Encounter (Signed)
-----   Message from Darden Dates sent at 07/13/2021  9:55 AM EST ----- Regarding: RE: NETSPOT Scan Good morning everyone! I started appeal process after denial of previous case.  I received a fax with authorization number [id number]. This policy is a "notification" policy, so it did not require the facility to be listed on the case. I will have someone scan in the fax I received to the media tab. Thanks, Amy ----- Message ----- From: Greer Pickerel Sent: 07/04/2021   7:50 AM EST To: April H Pait, Darden Dates, # Subject: NETSPOT Scan                                   Hi all,  Once this has received official insurance approval, please call [phone] with all of the auth information to get this patient scheduled.  Thank you! ----- Message ----- From: Darden Dates Sent: 07/03/2021   2:46 PM EDT To: April H Pait, Greer Pickerel, #  Case# [id number] in review. Records sent. ----- Message ----- From: Belva Chimes H Sent: 07/03/2021   1:32 PM EDT To: Greer Pickerel, [place], #   ----- Message ----- From: Timothy Lasso, RN Sent: 07/03/2021  12:14 PM EDT To: April H Pait, Amy L [place]  No injections that I am aware of April.  Amy can you please see the note regarding the prior auth.  ----- Message ----- From: Sheldon Silvan, April H Sent: 07/03/2021  11:56 AM EDT To: Greer Pickerel, Timothy Lasso, RN, #  Naiara Lombardozzi please advise, ty. ----- Message ----- From: Greer Pickerel Sent: 07/03/2021  11:45 AM EDT To: April H Pait, Jerrye Noble, Leah K Bratton  Hello,  FYI- the NETSPOT PET scans are not offered at [facility] at this time.    Please provide [place] with the insurance authorization information for the PET scan and we will get the patient scheduled.  Pre-auth is required prior to scheduling of speciality PET scans.  Also, does the patient receive any sandostatin type injections?  Is so, when is his next injection scheduled?   Thank you,  Museum/gallery conservator ----- Message  ----- From: Augustine Radar Sent: 07/03/2021  10:35 AM EDT To: Aleen Campi   ----- Message ----- From: Timothy Lasso, RN Sent: 07/03/2021  10:27 AM EDT To: April H Pait, Roosvelt Maser  Pt needs a PET scan please call thank you

## 2021-07-13 NOTE — Telephone Encounter (Signed)
April see Auth number below per [facility] and please call pt to set up.  Thank you

## 2021-07-16 NOTE — Telephone Encounter (Signed)
Left message on machine to call back  

## 2021-07-17 ENCOUNTER — Telehealth: Payer: Self-pay | Admitting: Gastroenterology

## 2021-07-17 NOTE — Telephone Encounter (Signed)
I have left a detailed message for the pt to call the schedulers to set up his PET scan.  I did provide the number to call and my name and number to call with any questions.

## 2021-07-17 NOTE — Telephone Encounter (Signed)
Patient is returning your call.  

## 2021-07-17 NOTE — Telephone Encounter (Signed)
I spoke with the pt and advises him that the schedulers have tried to reach out to him on several occasions to set up PET scan.  He was given the number and will call now to set that up.  I did send a message to April Pait (scheduler) to make her aware I did speak with him.

## 2021-08-06 ENCOUNTER — Emergency Department (HOSPITAL_COMMUNITY)
Admission: EM | Admit: 2021-08-06 | Discharge: 2021-08-06 | Disposition: A | Discharge status: Home / Self Care | Payer: Medicare Other | Attending: Emergency Medicine | Admitting: Emergency Medicine

## 2021-08-06 ENCOUNTER — Encounter: Payer: Self-pay | Admitting: *Deleted

## 2021-08-06 ENCOUNTER — Other Ambulatory Visit: Payer: Self-pay

## 2021-08-06 ENCOUNTER — Encounter (HOSPITAL_COMMUNITY): Payer: Self-pay | Admitting: Emergency Medicine

## 2021-08-06 DIAGNOSIS — E1122 Type 2 diabetes mellitus with diabetic chronic kidney disease: Secondary | ICD-10-CM | POA: Diagnosis not present

## 2021-08-06 DIAGNOSIS — N189 Chronic kidney disease, unspecified: Secondary | ICD-10-CM | POA: Insufficient documentation

## 2021-08-06 DIAGNOSIS — R531 Weakness: Secondary | ICD-10-CM | POA: Diagnosis not present

## 2021-08-06 DIAGNOSIS — R002 Palpitations: Secondary | ICD-10-CM | POA: Insufficient documentation

## 2021-08-06 DIAGNOSIS — R5381 Other malaise: Secondary | ICD-10-CM | POA: Insufficient documentation

## 2021-08-06 DIAGNOSIS — Z87891 Personal history of nicotine dependence: Secondary | ICD-10-CM | POA: Insufficient documentation

## 2021-08-06 DIAGNOSIS — I130 Hypertensive heart and chronic kidney disease with heart failure and stage 1 through stage 4 chronic kidney disease, or unspecified chronic kidney disease: Secondary | ICD-10-CM | POA: Insufficient documentation

## 2021-08-06 DIAGNOSIS — I509 Heart failure, unspecified: Secondary | ICD-10-CM | POA: Diagnosis not present

## 2021-08-06 LAB — CBC WITH DIFFERENTIAL/PLATELET
Abs Immature Granulocytes: 0.02 10*3/uL (ref 0.00–0.07)
Basophils Absolute: 0 10*3/uL (ref 0.0–0.1)
Basophils Relative: 0 %
Eosinophils Absolute: 0.3 10*3/uL (ref 0.0–0.5)
Eosinophils Relative: 4 %
HCT: 37.3 % — ABNORMAL LOW (ref 39.0–52.0)
Hemoglobin: 12.3 g/dL — ABNORMAL LOW (ref 13.0–17.0)
Immature Granulocytes: 0 %
Lymphocytes Relative: 21 %
Lymphs Abs: 1.9 10*3/uL (ref 0.7–4.0)
MCH: 28.1 pg (ref 26.0–34.0)
MCHC: 33 g/dL (ref 30.0–36.0)
MCV: 85.4 fL (ref 80.0–100.0)
Monocytes Absolute: 0.5 10*3/uL (ref 0.1–1.0)
Monocytes Relative: 6 %
Neutro Abs: 6.2 10*3/uL (ref 1.7–7.7)
Neutrophils Relative %: 69 %
Platelets: 179 10*3/uL (ref 150–400)
RBC: 4.37 MIL/uL (ref 4.22–5.81)
RDW: 14.2 % (ref 11.5–15.5)
WBC: 9 10*3/uL (ref 4.0–10.5)
nRBC: 0 % (ref 0.0–0.2)

## 2021-08-06 LAB — COMPREHENSIVE METABOLIC PANEL
ALT: 14 U/L (ref 0–44)
AST: 11 U/L — ABNORMAL LOW (ref 15–41)
Albumin: 3.5 g/dL (ref 3.5–5.0)
Alkaline Phosphatase: 61 U/L (ref 38–126)
Anion gap: 9 (ref 5–15)
BUN: 22 mg/dL (ref 8–23)
CO2: 27 mmol/L (ref 22–32)
Calcium: 8.6 mg/dL — ABNORMAL LOW (ref 8.9–10.3)
Chloride: 102 mmol/L (ref 98–111)
Creatinine, Ser: 2 mg/dL — ABNORMAL HIGH (ref 0.61–1.24)
GFR, Estimated: 35 mL/min — ABNORMAL LOW (ref >60)
Glucose, Bld: 104 mg/dL — ABNORMAL HIGH (ref 70–99)
Potassium: 3.7 mmol/L (ref 3.5–5.1)
Sodium: 138 mmol/L (ref 135–145)
Total Bilirubin: 0.6 mg/dL (ref 0.3–1.2)
Total Protein: 6.8 g/dL (ref 6.5–8.1)

## 2021-08-06 NOTE — ED Triage Notes (Signed)
Pt here with c/o "heart fluttering" off and on throughout the day along with increased weakness. Denies SOB or Chest Pain. States he was dizzy once today.

## 2021-08-06 NOTE — Discharge Instructions (Signed)
You were evaluated in the Emergency Department and after careful evaluation, we did not find any emergent condition requiring admission or further testing in the hospital.  Your exam/testing today was overall reassuring.  Recommend close follow-up with your regular doctors to discuss your symptoms.  Please return to the Emergency Department if you experience any worsening of your condition.  Thank you for allowing [place] to be a part of your care.

## 2021-08-06 NOTE — ED Provider Notes (Signed)
[facility] Emergency Department Provider Note MRN:  [id number]  Arrival date & time: 08/06/21     Chief Complaint   Palpitations   History of Present Illness   Gary L Widdowson Sr. is a 70 y.o. year-old male with a history of diabetes, CHF, hypertension presenting to the ED with chief complaint of palpitations.  Patient feeling persistent intermittent palpitations throughout the day today.  Had 1 episode of generalized weakness and malaise as well.  No chest pain, no shortness of breath, no leg pain or swelling, no headache, no vision change, no abdominal pain, no other complaints.  Had a recent cardiac monitor.  Symptoms mild.  No exacerbating or alleviating factors.  Review of Systems  A complete 10 system review of systems was obtained and all systems are negative except as noted in the HPI and PMH.   Patient's Health History    Past Medical History:  Diagnosis Date   CHF (congestive heart failure) (HCC)    Chronic cholecystitis    Chronic kidney disease    Diabetes mellitus without complication ([place])    Type II   Hypertension    Rheumatoid arthritis ([place])    Vitamin D deficiency    Hx: of    Past Surgical History:  Procedure Laterality Date   AV FISTULA PLACEMENT Left 06/28/2013   Procedure: ARTERIOVENOUS (AV) FISTULA CREATION- Left arm with ultrasound guidance;  Surgeon: Rosetta Posner, MD;  Location: [place];  Service: Vascular;  Laterality: Left;   BIOPSY  06/28/2021   Procedure: BIOPSY;  Surgeon: Milus Banister, MD;  Location: [facility];  Service: Endoscopy;;   CHOLECYSTECTOMY N/A 10/28/2017   Procedure: LAPAROSCOPIC CHOLECYSTECTOMY WITH INTRAOPERATIVE CHOLANGIOGRAM;  Surgeon: Donnie Mesa, MD;  Location: [place];  Service: General;  Laterality: N/A;   COLONOSCOPY     hx: of   ESOPHAGOGASTRODUODENOSCOPY (EGD) WITH PROPOFOL N/A 06/28/2021   Procedure: ESOPHAGOGASTRODUODENOSCOPY (EGD) WITH PROPOFOL;  Surgeon: Milus Banister, MD;  Location:  [facility];  Service: Endoscopy;  Laterality: N/A;   EUS N/A 06/28/2021   Procedure: UPPER ENDOSCOPIC ULTRASOUND (EUS) LINEAR;  Surgeon: Milus Banister, MD;  Location: [facility];  Service: Endoscopy;  Laterality: N/A;   FINE NEEDLE ASPIRATION N/A 06/28/2021   Procedure: FINE NEEDLE ASPIRATION (FNA) LINEAR;  Surgeon: Milus Banister, MD;  [facility];  Service: Endoscopy;  Laterality: N/A;   HERNIA REPAIR     INSERTION OF DIALYSIS CATHETER N/A 06/28/2013   Procedure: INSERTION OF DIALYSIS CATHETER;  Surgeon: Rosetta Posner, MD;  Location: [place];  Service: Vascular;  Laterality: N/A;   IR GENERIC HISTORICAL  09/08/2016   IR PERC CHOLECYSTOSTOMY 09/08/2016 [name], MD [facility]   IR GENERIC HISTORICAL  10/15/2016   IR RADIOLOGIST EVAL & MGMT 10/15/2016 Ardis Rowan, PA-C [facility]   IR GENERIC HISTORICAL  10/29/2016   IR RADIOLOGIST EVAL & MGMT 10/29/2016 Jacqulynn Cadet, MD [facility]   LIGATION OF COMPETING BRANCHES OF ARTERIOVENOUS FISTULA Left 10/12/2013   Procedure: LIGATION OF COMPETING BRANCHES OF ARTERIOVENOUS FISTULA;  Surgeon: Elam Dutch, MD;  Location: [facility];  Service: Vascular;  Laterality: Left;   neck surgery      Family History  Problem Relation Age of Onset   Cancer Mother    Arthritis/Rheumatoid Sister    Diabetes Child        70   Colon polyps Neg Hx    Rectal cancer Neg Hx     Social  History   Socioeconomic History   Marital status: Married    Spouse name: Not on file   Number of children: Not on file   Years of education: Not on file   Highest education level: Not on file  Occupational History   Not on file  Tobacco Use   Smoking status: Former    Packs/day: 0.50    Years: 10.00    Pack years: 5.00    Types: Cigarettes    Quit date: 09/02/1976    Years since quitting: 44.9   Smokeless tobacco: Never  Vaping Use   Vaping Use: Never used  Substance and Sexual Activity   Alcohol use: No   Drug use: No    Sexual activity: Not on file  Other Topics Concern   Not on file  Social History Narrative   Not on file   Social Determinants of Health   Financial Resource Strain: Not on file  Food Insecurity: Not on file  Transportation Needs: Not on file  Physical Activity: Not on file  Stress: Not on file  Social Connections: Not on file  Intimate Partner Violence: Not on file     Physical Exam   Vitals:   08/06/21 0200 08/06/21 0230  BP: (!) 150/65 (!) 156/70  Pulse: 78 77  Resp: 17 16  Temp:    SpO2: 100% 99%    CONSTITUTIONAL: Well-appearing, NAD NEURO:  Alert and oriented x 3, no focal deficits EYES:  eyes equal and reactive ENT/NECK:  no LAD, no JVD CARDIO: Regular rate, well-perfused, normal S1 and S2 PULM:  CTAB no wheezing or rhonchi GI/GU:  normal bowel sounds, non-distended, non-tender MSK/SPINE:  No gross deformities, no edema SKIN:  no rash, atraumatic PSYCH:  Appropriate speech and behavior  *Additional and/or pertinent findings included in MDM below  Diagnostic and Interventional Summary    EKG Interpretation  Date/Time:  Monday August 06 2021 01:01:58 EST Ventricular Rate:  82 PR Interval:  159 QRS Duration: 168 QT Interval:  387 QTC Calculation: 452 R Axis:   32 Text Interpretation: Sinus rhythm Nonspecific intraventricular conduction delay Confirmed by Gerlene Fee [phone]) on 08/06/2021 2:08:11 AM       Labs Reviewed  CBC WITH DIFFERENTIAL/PLATELET - Abnormal; Notable for the following components:      Result Value   Hemoglobin 12.3 (*)    HCT 37.3 (*)    All other components within normal limits  COMPREHENSIVE METABOLIC PANEL - Abnormal; Notable for the following components:   Glucose, Bld 104 (*)    Creatinine, Ser 2.00 (*)    Calcium 8.6 (*)    AST 11 (*)    GFR, Estimated 35 (*)    All other components within normal limits    No orders to display    Medications - No data to display   Procedures  /  Critical Care .1-3 Lead EKG  Interpretation Performed by: Maudie Flakes, MD Authorized by: Maudie Flakes, MD     Interpretation: abnormal     ECG rate:  70s   ECG rate assessment: normal     Rhythm: sinus rhythm     Ectopy: PVCs   Comments:     Cardiac monitoring was ordered to monitor the patient for dysrhythmia.  I personally interpreted the patient's cardiac monitor while at the bedside.    ED Course and Medical Decision Making  I have reviewed the triage vital signs, the nursing notes, and pertinent available records from the EMR.  Listed  above are laboratory and imaging tests that I personally ordered, reviewed, and interpreted and then considered in my medical decision making (see below for details).  Palpitations without other concerning symptoms or features.  Normal vital signs.  Monitor demonstrating occasional PVC.  No other signs of other more significant arrhythmias.  Per documentation patient has been having intermittent nonsustained V. tach, with cardiology following.  No V. tach here in the emergency department, monitored for a few hours.  Labs reassuring, appropriate for discharge with cardiology follow-up.       Barth Kirks. Sedonia Small, [place] [email or web address]  Final Clinical Impressions(s) / ED Diagnoses     ICD-10-CM   1. Palpitations  R00.2       ED Discharge Orders     None        Discharge Instructions Discussed with and Provided to Patient:    Discharge Instructions      You were evaluated in the Emergency Department and after careful evaluation, we did not find any emergent condition requiring admission or further testing in the hospital.  Your exam/testing today was overall reassuring.  Recommend close follow-up with your regular doctors to discuss your symptoms.  Please return to the Emergency Department if you experience any worsening of your condition.  Thank you for allowing [place] to be a part of your  care.        Maudie Flakes, MD 08/06/21 [phone]

## 2021-08-09 ENCOUNTER — Other Ambulatory Visit: Payer: Self-pay

## 2021-08-09 ENCOUNTER — Encounter (HOSPITAL_COMMUNITY)
Admission: RE | Admit: 2021-08-09 | Discharge: 2021-08-09 | Disposition: A | Discharge status: Home / Self Care | Payer: Medicare Other | Source: Ambulatory Visit | Attending: Gastroenterology | Admitting: Gastroenterology

## 2021-08-09 DIAGNOSIS — R634 Abnormal weight loss: Secondary | ICD-10-CM | POA: Diagnosis not present

## 2021-08-09 DIAGNOSIS — R933 Abnormal findings on diagnostic imaging of other parts of digestive tract: Secondary | ICD-10-CM | POA: Diagnosis not present

## 2021-08-09 DIAGNOSIS — K2289 Other specified disease of esophagus: Secondary | ICD-10-CM | POA: Diagnosis not present

## 2021-08-09 MED ORDER — FLUDEOXYGLUCOSE F - 18 (FDG) INJECTION
11.4130 | Freq: Once | INTRAVENOUS | Status: AC | PRN
  Administered 2021-08-09: 11.413 via INTRAVENOUS

## 2021-08-09 NOTE — Progress Notes (Signed)
Back cardiology Office Note   Date:  08/10/2021   ID:  Gary MANNAN Sr., DOB 03-11-1951, MRN [id number]  PCP:  Celene Squibb, MD  Cardiologist: Dr. Percival Spanish CC: Follow Up Atrial fibrillation     History of Present Illness: Gary L Swantek Sr. is a 70 y.o. male who presents for ongoing assessment and management of atrial fibrillation, which occurred after a bout of pneumonia, and ongoing complaints of palpitations.  Other history includes CHF, diabetes mellitus type 2, hypertension, rheumatoid arthritis, chronic cholecystitis, chronic kidney disease (previously on dialysis).  On last office visit Dr. Percival Spanish ordered a cardiac monitor to evaluate his palpitations and for recurrence of atrial fibrillation.  This was completed on 08/05/2021 with no evidence of atrial fibrillation.  He did have a very short run of NSVT.  No change in his therapy was planned.  Of note, he did have an CT of his chest with contrast on 06/22/2021 which was completed due to unintentional weight loss.  Report read "difficult to exclude a mid esophageal mass at the level of the carina" the esophagus was poorly evaluated due to internal debris/fluid and decompressed state.  A follow-up nuclear medicine PET imaging study was completed on 08/09/2021 with results pending.  He comes today feeling tired, states that he is not able to do anything because he is waiting on test results.  He does not have as much energy, he is not eating very much, and has lost 10 pounds since last office visit on 07/11/2021 dropping from 208 pounds to 198 pounds.  He did have his PET scan completed but does not know the results.  He denies any palpitations, racing heart rate, bleeding, or abdominal pain.  Past Medical History:  Diagnosis Date   CHF (congestive heart failure) (HCC)    Chronic cholecystitis    Chronic kidney disease    Diabetes mellitus without complication (HCC)    Type II   Hypertension    Rheumatoid arthritis ([place])    Vitamin  D deficiency    Hx: of    Past Surgical History:  Procedure Laterality Date   AV FISTULA PLACEMENT Left 06/28/2013   Procedure: ARTERIOVENOUS (AV) FISTULA CREATION- Left arm with ultrasound guidance;  Surgeon: Rosetta Posner, MD;  Location: [place];  Service: Vascular;  Laterality: Left;   BIOPSY  06/28/2021   Procedure: BIOPSY;  Surgeon: Milus Banister, MD;  Location: [facility];  Service: Endoscopy;;   CHOLECYSTECTOMY N/A 10/28/2017   Procedure: LAPAROSCOPIC CHOLECYSTECTOMY WITH INTRAOPERATIVE CHOLANGIOGRAM;  Surgeon: Donnie Mesa, MD;  Location: [place];  Service: General;  Laterality: N/A;   COLONOSCOPY     hx: of   ESOPHAGOGASTRODUODENOSCOPY (EGD) WITH PROPOFOL N/A 06/28/2021   Procedure: ESOPHAGOGASTRODUODENOSCOPY (EGD) WITH PROPOFOL;  Surgeon: Milus Banister, MD;  Location: [facility];  Service: Endoscopy;  Laterality: N/A;   EUS N/A 06/28/2021   Procedure: UPPER ENDOSCOPIC ULTRASOUND (EUS) LINEAR;  Surgeon: Milus Banister, MD;  Location: [facility];  Service: Endoscopy;  Laterality: N/A;   FINE NEEDLE ASPIRATION N/A 06/28/2021   Procedure: FINE NEEDLE ASPIRATION (FNA) LINEAR;  Surgeon: Milus Banister, MD;  Location: [facility];  Service: Endoscopy;  Laterality: N/A;   HERNIA REPAIR     INSERTION OF DIALYSIS CATHETER N/A 06/28/2013   Procedure: INSERTION OF DIALYSIS CATHETER;  Surgeon: Rosetta Posner, MD;  Location: [place];  Service: Vascular;  Laterality: N/A;   IR GENERIC HISTORICAL  09/08/2016   IR PERC CHOLECYSTOSTOMY 09/08/2016 [name], MD  [facility]   IR GENERIC HISTORICAL  10/15/2016   IR RADIOLOGIST EVAL & MGMT 10/15/2016 Ardis Rowan, PA-C [facility]   IR GENERIC HISTORICAL  10/29/2016   IR RADIOLOGIST EVAL & MGMT 10/29/2016 Jacqulynn Cadet, MD [facility]   LIGATION OF COMPETING BRANCHES OF ARTERIOVENOUS FISTULA Left 10/12/2013   Procedure: LIGATION OF COMPETING BRANCHES OF ARTERIOVENOUS FISTULA;  Surgeon: Elam Dutch, MD;  [facility]  OR;  Service: Vascular;  Laterality: Left;   neck surgery       Current Outpatient Medications  Medication Sig Dispense Refill   acetaminophen (TYLENOL) 500 MG tablet Take 500-1,000 mg by mouth daily as needed for moderate pain.     atorvastatin (LIPITOR) 40 MG tablet Take 40 mg by mouth in the morning.     calcium carbonate (OSCAL) 1500 (600 Ca) MG TABS tablet Take 600 mg of elemental calcium by mouth in the morning.     Cholecalciferol (VITAMIN D3) 50 MCG (2000 UT) TABS Take 2,000 Units by mouth every other day.     cloNIDine (CATAPRES) 0.2 MG tablet Take 0.2 mg by mouth 2 (two) times daily.     diltiazem (CARDIZEM CD) 180 MG 24 hr capsule Take 180 mg by mouth at bedtime.     insulin glargine (LANTUS) 100 UNIT/ML injection Inject 40 Units into the skin in the morning.     insulin lispro (HUMALOG) 100 UNIT/ML cartridge Inject 16 Units into the skin 2 (two) times daily as needed (with lunch and supper if needed for low blood sugar).     Insulin Pen Needle (PEN NEEDLES) 31G X 6 MM MISC Use 1 needle per injection as directed 100 each 1   leflunomide (ARAVA) 20 MG tablet Take 20 mg by mouth in the morning.     Omega-3 Fatty Acids (FISH OIL) 1000 MG CAPS Take 1,000 mg by mouth in the morning.     predniSONE (DELTASONE) 10 MG tablet Take 10 mg by mouth every other day.     sucralfate (CARAFATE) 1 g tablet Take 1 tablet (1 g total) by mouth 4 (four) times daily -  with meals and at bedtime. (Patient taking differently: Take 1 g by mouth as needed. Take 1 tablet (1 g) up to 4 times daily) 90 tablet 0   triamcinolone cream (KENALOG) 0.1 % Apply 1 application topically 2 (two) times daily as needed (itching/irritation).     TRULICITY 1.5 TK/1.6WF SOPN Inject 1.5 mg into the skin every Wednesday.     UNIFINE PENTIPS 31G X 5 MM MISC USE AS DIRECTED WITH INSULIN 100 each PRN   pantoprazole (PROTONIX) 40 MG tablet Take 1 tablet (40 mg total) by mouth 2 (two) times daily. (Patient taking differently: Take  40 mg by mouth in the morning.) 60 tablet 2   No current facility-administered medications for this visit.    Allergies:   Glyburide, Metformin and related, Methotrexate derivatives, and Orencia [abatacept]    Social History:  The patient  reports that he quit smoking about 44 years ago. His smoking use included cigarettes. He has a 5.00 pack-year smoking history. He has never used smokeless tobacco. He reports that he does not drink alcohol and does not use drugs.   Family History:  The patient's family history includes Arthritis/Rheumatoid in his sister; Cancer in his mother; Diabetes in his child.    ROS: All other systems are reviewed and negative. Unless otherwise mentioned in H&P    PHYSICAL EXAM: VS:  BP 138/68   Pulse 96   Ht 6\' 1"  (1.854 m)   Wt 198 lb 3.2 oz (89.9 kg)   SpO2 98%   BMI 26.15 kg/m  , BMI Body mass index is 26.15 kg/m. GEN: Well nourished, well developed, in no acute distress HEENT: normal Neck: no JVD, carotid bruits, or masses Cardiac:RRR; no murmurs, rubs, or gallops,no edema  Respiratory:  Clear to auscultation bilaterally, normal work of breathing GI: soft, nontender, nondistended, + BS MS: no deformity or atrophy nontender to palpation Skin: warm and dry, no rash Neuro:  Strength and sensation are intact Psych: euthymic mood, full affect   EKG:  EKG is not ordered today.    Recent Labs: 06/25/2021: TSH 2.370 08/06/2021: ALT 14; BUN 22; Creatinine, Ser 2.00; Hemoglobin 12.3; Platelets 179; Potassium 3.7; Sodium 138    Lipid Panel    Component Value Date/Time   CHOL 325 (H) 06/19/2013 0538   TRIG 145 06/19/2013 0538   HDL 46 06/19/2013 0538   CHOLHDL 7.1 06/19/2013 0538   VLDL 29 06/19/2013 0538   LDLCALC 250 (H) 06/19/2013 0538      Wt Readings from Last 3 Encounters:  08/10/21 198 lb 3.2 oz (89.9 kg)  08/06/21 200 lb (90.7 kg)  07/11/21 208 lb (94.3 kg)      Other studies Reviewed: Echocardiogram 09/15/2016 Left ventricle:  The cavity size was normal. Wall thickness was    increased in a pattern of moderate LVH. Systolic function was    vigorous. The estimated ejection fraction was in the range of 65%    to 70%. Wall motion was normal; there were no regional wall    motion abnormalities. Doppler parameters are consistent with    abnormal left ventricular relaxation (grade 1 diastolic    dysfunction).  - Aortic valve: Valve area (VTI): 2.67 cm^2. Valve area (Vmax):    2.21 cm^2. Valve area (Vmean): 2.53 cm^2.  - Atrial septum: No defect or patent foramen ovale was identified.  - Pulmonary arteries: Systolic pressure was moderately increased.    PA peak pressure: 40 mm Hg (S).  - Technically adequate study.    ASSESSMENT AND PLAN:  1.  Atrial fibrillation: Heart rate is well controlled, he denies any palpitations or heart racing.  No evidence of atrial fibrillation on auscultation.  Cardiac monitor was negative for atrial fibrillation although he did have short runs of NSVT.  He is not on anticoagulation therapy.  He is to continue on diltiazem 180 mg at bedtime.  2.  Hypertension: Blood pressures currently well controlled.  He remains on the diltiazem and clonidine.  He denies any dizziness or position changes causing near syncope.  3.  Unintended weight loss: I have reviewed his recent PET scan with him that was negative for malignancy.  He does have a hiatal hernia, bone spurs in the thoracic spine, and evidence of a lacunar infarct.  I have given him a copy of this report for him to go over with his physician to go into more detail concerning findings and any follow-up testing which may be necessary.  I have advised him to become more active, supplement his diet with Glucerna or other meal replacement.  Eat small meals, and try and be more active concerning walking as this was something he enjoyed doing.  He will continue to follow with PCP for ongoing assessment.  Type 2 diabetes: Followed by PCP   Current  medicines are reviewed at length with the patient today.  I  have spent 25 minutes dedicated to the care of this patient on the date of this encounter to include pre-visit review of records, assessment, management and diagnostic testing,with shared decision making.  Labs/ tests ordered today include: None  Phill Myron. West Pugh, ANP, AACC   08/10/2021 11:21 AM    [facility] [address] Office [phone] Fax [phone]  Notice: This dictation was prepared with Dragon dictation along with smaller phrase technology. Any transcriptional errors that result from this process are unintentional and may not be corrected upon review.

## 2021-08-10 ENCOUNTER — Ambulatory Visit: Payer: Medicare Other | Admitting: Adult Health

## 2021-08-10 ENCOUNTER — Telehealth: Payer: Self-pay | Admitting: Gastroenterology

## 2021-08-10 ENCOUNTER — Encounter: Payer: Self-pay | Admitting: Adult Health

## 2021-08-10 VITALS — BP 138/68 | HR 96 | Ht 73.0 in | Wt 198.2 lb

## 2021-08-10 DIAGNOSIS — E119 Type 2 diabetes mellitus without complications: Secondary | ICD-10-CM

## 2021-08-10 DIAGNOSIS — I480 Paroxysmal atrial fibrillation: Secondary | ICD-10-CM | POA: Diagnosis not present

## 2021-08-10 DIAGNOSIS — R109 Unspecified abdominal pain: Secondary | ICD-10-CM | POA: Diagnosis not present

## 2021-08-10 DIAGNOSIS — I10 Essential (primary) hypertension: Secondary | ICD-10-CM | POA: Diagnosis not present

## 2021-08-10 NOTE — Patient Instructions (Signed)
Medication Instructions:  No Changes *If you need a refill on your cardiac medications before your next appointment, please call your pharmacy*   Lab Work: No Labs  If you have labs (blood work) drawn today and your tests are completely normal, you will receive your results only by: [place] (if you have MyChart) OR A paper copy in the mail If you have any lab test that is abnormal or we need to change your treatment, we will call you to review the results.   Testing/Procedures: No Testing   Follow-Up: At [facility], you and your health needs are our priority.  As part of our continuing mission to provide you with exceptional heart care, we have created designated Provider Care Teams.  These Care Teams include your primary Cardiologist (physician) and Advanced Practice Providers (APPs -  Physician Assistants and Nurse Practitioners) who all work together to provide you with the care you need, when you need it.  We recommend signing up for the patient portal called "MyChart".  Sign up information is provided on this After Visit Summary.  MyChart is used to connect with patients for Virtual Visits (Telemedicine).  Patients are able to view lab/test results, encounter notes, upcoming appointments, etc.  Non-urgent messages can be sent to your provider as well.   To learn more about what you can do with MyChart, go to [email or web address].    Your next appointment:   6 month(s)  The format for your next appointment:   In Person  Provider:   Minus Breeding, MD

## 2021-08-10 NOTE — Telephone Encounter (Signed)
Patient returned your call, requesting a cal back. Please advise.

## 2021-08-10 NOTE — Telephone Encounter (Signed)
Please see 12/9 result note

## 2021-08-14 DIAGNOSIS — R809 Proteinuria, unspecified: Secondary | ICD-10-CM | POA: Diagnosis not present

## 2021-08-14 DIAGNOSIS — E1122 Type 2 diabetes mellitus with diabetic chronic kidney disease: Secondary | ICD-10-CM | POA: Diagnosis not present

## 2021-08-14 DIAGNOSIS — I480 Paroxysmal atrial fibrillation: Secondary | ICD-10-CM | POA: Diagnosis not present

## 2021-08-14 DIAGNOSIS — K219 Gastro-esophageal reflux disease without esophagitis: Secondary | ICD-10-CM | POA: Diagnosis not present

## 2021-08-14 DIAGNOSIS — E782 Mixed hyperlipidemia: Secondary | ICD-10-CM | POA: Diagnosis not present

## 2021-08-14 DIAGNOSIS — D72829 Elevated white blood cell count, unspecified: Secondary | ICD-10-CM | POA: Diagnosis not present

## 2021-08-14 DIAGNOSIS — I10 Essential (primary) hypertension: Secondary | ICD-10-CM | POA: Diagnosis not present

## 2021-08-14 DIAGNOSIS — E211 Secondary hyperparathyroidism, not elsewhere classified: Secondary | ICD-10-CM | POA: Diagnosis not present

## 2021-08-14 DIAGNOSIS — N184 Chronic kidney disease, stage 4 (severe): Secondary | ICD-10-CM | POA: Diagnosis not present

## 2021-08-14 DIAGNOSIS — D638 Anemia in other chronic diseases classified elsewhere: Secondary | ICD-10-CM | POA: Diagnosis not present

## 2021-08-14 DIAGNOSIS — M069 Rheumatoid arthritis, unspecified: Secondary | ICD-10-CM | POA: Diagnosis not present

## 2021-08-24 ENCOUNTER — Telehealth: Payer: Self-pay | Admitting: Gastroenterology

## 2021-08-24 NOTE — Telephone Encounter (Signed)
Pt called in asking what the next steps would be to fix his hiatal hernia that was found on his last scan. He stated that swallowing has been okay, but still having gas and diarrhea. He has been taking his medication as prescribed. He stated that he would like to know the plan so that this issue can be fixed. Please advise.

## 2021-08-24 NOTE — Telephone Encounter (Signed)
Spoke with pt about the hernia, and explained that it was not clear if that is what is causing his symptoms. He stated that he would still like to be referred to a surgeon if possible, and will follow-up at [place] in February.

## 2021-08-24 NOTE — Telephone Encounter (Signed)
Patient called and stated that he has a hernia, he is wanting to know what the next steps are moving forward. Please advise.

## 2021-08-28 NOTE — Telephone Encounter (Signed)
[facility] referral has been made.  Records faxed.  They will call the pt and set up an appt.  The pt has been advised.

## 2021-09-12 DIAGNOSIS — K4090 Unilateral inguinal hernia, without obstruction or gangrene, not specified as recurrent: Secondary | ICD-10-CM | POA: Diagnosis not present

## 2021-09-19 NOTE — Progress Notes (Signed)
Sent message, via epic in basket, requesting orders in epic from surgeon.  

## 2021-09-24 ENCOUNTER — Ambulatory Visit: Payer: Medicare Other | Admitting: Gastroenterology

## 2021-09-24 NOTE — Patient Instructions (Signed)
DUE TO COVID-19 ONLY ONE VISITOR IS ALLOWED TO COME WITH YOU AND STAY IN THE WAITING ROOM ONLY DURING PRE OP AND PROCEDURE DAY OF SURGERY IF YOU ARE GOING HOME AFTER SURGERY. IF YOU ARE SPENDING THE NIGHT 2 PEOPLE MAY VISIT WITH YOU IN YOUR PRIVATE ROOM AFTER SURGERY UNTIL VISITING  HOURS ARE OVER AT 800 PM AND 1  VISITOR  MAY  SPEND THE NIGHT.                 Gary L Koenen Sr.     Your procedure is scheduled on: 10/05/21   Report to [facility] Main  Entrance   Report to admitting at  8:15 AM     Call this number if you have problems the morning of surgery [phone]    Remember: Do not eat food  :After Midnight the night before your surgery,   You may have clear liquids from midnight until 4:30 AM    CLEAR LIQUID DIET                                                                        [place], NO [place].     Take these medicines the morning of surgery with A SIP OF WATER: Clonidine, Diltiazem, Pantoprazole  DO NOT TAKE ANY DIABETIC MEDICATIONS DAY OF YOUR SURGERY How to Manage Your Diabetes Before and After Surgery  Why is it important to control my blood sugar before and after surgery? Improving blood sugar levels before and after surgery helps healing and can limit problems. A way of improving blood sugar control is eating a healthy diet by:  Eating less sugar and carbohydrates  Increasing activity/exercise  Talking with your doctor about reaching your blood sugar goals High blood sugars (greater than 180 mg/dL) can raise your risk of infections and slow your recovery, so you will need to focus on controlling your diabetes during the weeks before surgery. Make sure that the doctor who takes care of your diabetes knows about your planned surgery including the date and location.  How do I manage my blood sugar before surgery? Check your blood sugar at least 4 times a day, starting 2  days before surgery, to make sure that the level is not too high or low. Check your blood sugar the morning of your surgery when you wake up and every 2 hours until you get to the Short Stay unit. If your blood sugar is less than 70 mg/dL, you will need to treat for low blood sugar: Do not take insulin. Treat a low blood sugar (less than 70 mg/dL) with  cup of clear juice (cranberry or apple), 4 glucose tablets, OR glucose gel. Recheck blood sugar in 15 minutes after treatment (to make sure it is greater than 70 mg/dL). If your blood sugar is not greater than 70 mg/dL on recheck, call [phone] for further instructions. Report your blood sugar to the short stay nurse when you get to Short Stay.  If you are admitted to the hospital after surgery: Your blood sugar will be checked by the staff and you will probably be given insulin after surgery (instead of oral  diabetes medicines) to make sure you have good blood sugar levels. The goal for blood sugar control after surgery is 80-180 mg/dL.   WHAT DO I DO ABOUT MY DIABETES MEDICATION?  Do not take oral diabetes medicines (pills) the morning of surgery.        THE MORNING OF SURGERY, take  15 units of  [place]  insulin.   If your CBG is greater than 220 mg/dL, you may take  of your sliding scale  (correction) dose of insulin.                                  You may not have any metal on your body including              piercings  Do not wear jewelry, lotions, powders or  deodorant             Men may shave face and neck.   Do not bring valuables to the hospital. [place].  Contacts, dentures or bridgework may not be worn into surgery.     Patients discharged the day of surgery will not be allowed to drive home.  IF YOU ARE HAVING SURGERY AND GOING HOME THE SAME DAY, YOU MUST HAVE AN ADULT TO DRIVE YOU HOME AND BE WITH YOU FOR 24 HOURS. YOU MAY GO HOME BY TAXI OR UBER OR  ORTHERWISE, BUT AN ADULT MUST ACCOMPANY YOU HOME AND STAY WITH YOU FOR 24 HOURS.  Name and phone number of your driver:  Special Instructions: N/A              Please read over the following fact sheets you were given: _____________________________________________________________________             [facility] - Preparing for Surgery Before surgery, you can play an important role.  Because skin is not sterile, your skin needs to be as free of germs as possible.  You can reduce the number of germs on your skin by washing with CHG (chlorahexidine gluconate) soap before surgery.  CHG is an antiseptic cleaner which kills germs and bonds with the skin to continue killing germs even after washing. Please DO NOT use if you have an allergy to CHG or antibacterial soaps.  If your skin becomes reddened/irritated stop using the CHG and inform your nurse when you arrive at Short Stay.  You may shave your face/neck. Please follow these instructions carefully:  1.  Shower with CHG Soap the night before surgery and the  morning of Surgery.  2.  If you choose to wash your hair, wash your hair first as usual with your  normal  shampoo.  3.  After you shampoo, rinse your hair and body thoroughly to remove the  shampoo.                            4.  Use CHG as you would any other liquid soap.  You can apply chg directly  to the skin and wash                       Gently with a scrungie or clean washcloth.  5.  Apply the CHG Soap to your body ONLY FROM THE NECK DOWN.  Do not use on face/ open                           Wound or open sores. Avoid contact with eyes, ears mouth and genitals (private parts).                       Wash face,  Genitals (private parts) with your normal soap.             6.  Wash thoroughly, paying special attention to the area where your surgery  will be performed.  7.  Thoroughly rinse your body with warm water from the neck down.  8.  DO NOT shower/wash with your normal soap after  using and rinsing off  the CHG Soap.                9.  Pat yourself dry with a clean towel.            10.  Wear clean pajamas.            11.  Place clean sheets on your bed the night of your first shower and do not  sleep with pets. Day of Surgery : Do not apply any lotions/deodorants the morning of surgery.  Please wear clean clothes to the hospital/surgery center.  FAILURE TO FOLLOW THESE INSTRUCTIONS MAY RESULT IN THE CANCELLATION OF YOUR SURGERY PATIENT SIGNATURE_________________________________  NURSE SIGNATURE__________________________________  ________________________________________________________________________

## 2021-09-25 ENCOUNTER — Encounter (HOSPITAL_COMMUNITY)
Admission: RE | Admit: 2021-09-25 | Discharge: 2021-09-25 | Disposition: A | Discharge status: Home / Self Care | Payer: Medicare Other | Source: Ambulatory Visit | Attending: Surgery | Admitting: Surgery

## 2021-09-25 ENCOUNTER — Encounter (HOSPITAL_COMMUNITY): Payer: Self-pay

## 2021-09-25 ENCOUNTER — Other Ambulatory Visit: Payer: Self-pay

## 2021-09-25 VITALS — BP 165/71 | HR 73 | Temp 98.0°F | Resp 18 | Ht 73.5 in | Wt 205.0 lb

## 2021-09-25 DIAGNOSIS — I130 Hypertensive heart and chronic kidney disease with heart failure and stage 1 through stage 4 chronic kidney disease, or unspecified chronic kidney disease: Secondary | ICD-10-CM | POA: Insufficient documentation

## 2021-09-25 DIAGNOSIS — K4090 Unilateral inguinal hernia, without obstruction or gangrene, not specified as recurrent: Secondary | ICD-10-CM | POA: Insufficient documentation

## 2021-09-25 DIAGNOSIS — Z01812 Encounter for preprocedural laboratory examination: Secondary | ICD-10-CM | POA: Insufficient documentation

## 2021-09-25 DIAGNOSIS — N184 Chronic kidney disease, stage 4 (severe): Secondary | ICD-10-CM | POA: Diagnosis not present

## 2021-09-25 DIAGNOSIS — I509 Heart failure, unspecified: Secondary | ICD-10-CM | POA: Insufficient documentation

## 2021-09-25 DIAGNOSIS — I4891 Unspecified atrial fibrillation: Secondary | ICD-10-CM | POA: Diagnosis not present

## 2021-09-25 DIAGNOSIS — Z87891 Personal history of nicotine dependence: Secondary | ICD-10-CM | POA: Insufficient documentation

## 2021-09-25 DIAGNOSIS — E119 Type 2 diabetes mellitus without complications: Secondary | ICD-10-CM | POA: Diagnosis not present

## 2021-09-25 DIAGNOSIS — E1122 Type 2 diabetes mellitus with diabetic chronic kidney disease: Secondary | ICD-10-CM | POA: Diagnosis not present

## 2021-09-25 LAB — CBC
HCT: 36.1 % — ABNORMAL LOW (ref 39.0–52.0)
Hemoglobin: 11.6 g/dL — ABNORMAL LOW (ref 13.0–17.0)
MCH: 27.4 pg (ref 26.0–34.0)
MCHC: 32.1 g/dL (ref 30.0–36.0)
MCV: 85.1 fL (ref 80.0–100.0)
Platelets: 196 10*3/uL (ref 150–400)
RBC: 4.24 MIL/uL (ref 4.22–5.81)
RDW: 14 % (ref 11.5–15.5)
WBC: 9 10*3/uL (ref 4.0–10.5)
nRBC: 0 % (ref 0.0–0.2)

## 2021-09-25 LAB — GLUCOSE, CAPILLARY: Glucose-Capillary: 123 mg/dL — ABNORMAL HIGH (ref 70–99)

## 2021-09-25 LAB — BASIC METABOLIC PANEL
Anion gap: 6 (ref 5–15)
BUN: 28 mg/dL — ABNORMAL HIGH (ref 8–23)
CO2: 27 mmol/L (ref 22–32)
Calcium: 8.8 mg/dL — ABNORMAL LOW (ref 8.9–10.3)
Chloride: 107 mmol/L (ref 98–111)
Creatinine, Ser: 2.2 mg/dL — ABNORMAL HIGH (ref 0.61–1.24)
GFR, Estimated: 31 mL/min — ABNORMAL LOW (ref >60)
Glucose, Bld: 118 mg/dL — ABNORMAL HIGH (ref 70–99)
Potassium: 4.2 mmol/L (ref 3.5–5.1)
Sodium: 140 mmol/L (ref 135–145)

## 2021-09-25 LAB — HEMOGLOBIN A1C
Hgb A1c MFr Bld: 6.6 % — ABNORMAL HIGH (ref 4.8–5.6)
Mean Plasma Glucose: 143 mg/dL

## 2021-09-25 NOTE — Progress Notes (Signed)
COVID test- NA   PCP - Dr. Lenetta Quaker Cardiologist - Dr. Vita Barley  Chest x-ray - 06/25/21-epic EKG - 06/25/21-epic Stress Test - no ECHO - 2018 Cardiac Cath - no Pacemaker/ICD device last checked:NA  Sleep Study - no CPAP -   Fasting Blood Sugar - 101-110 Checks Blood Sugar _TID____ times a day  Blood Thinner Instructions:NA Aspirin Instructions: Last Dose:  Anesthesia review: yes  Patient denies shortness of breath, fever, cough and chest pain at PAT appointment Pt Reports that he is able to climb stairs do housework and ADLs without SOB. His blood sugars have been " up and down" but he has lost 30 lbs  with "all this"  Patient verbalized understanding of instructions that were given to them at the PAT appointment. Patient was also instructed that they will need to review over the PAT instructions again at home before surgery. Yes. I told him to review it with his wife and call Dr. Nevada Crane if his sugars are high.

## 2021-09-26 NOTE — Progress Notes (Signed)
Anesthesia Chart Review   Case: [id number] Date/Time: 10/05/21 1015   Procedure: XI ROBOTIC ASSISTED LEFT INGUINAL HERNIA REPAIR (Left)   Anesthesia type: General   Pre-op diagnosis: left inguinal hernia   Location: [place] / [facility]   Surgeons: Johnathan Hausen, MD       DISCUSSION:70 y.o. former smoker with h/o HTN, DM II, CHF, CKD Stage IV creatinine stable, atrial fibrillation in setting of pneumonia with no recurrence, left inguinal hernia scheduled for above procedure 10/05/2021 with Dr. Johnathan Hausen.   Pt last seen by cardiology 08/10/2021. Stable at this visit.   Anticipate pt can proceed with planned procedure barring acute status change.   VS: BP (!) 165/71    Pulse 73    Temp 36.7 C (Oral)    Resp 18    Ht 6' 1.5" (1.867 m)    Wt 93 kg    SpO2 100%    BMI 26.68 kg/m   PROVIDERS: Celene Squibb, MD is PCP   Minus Breeding, MD is Cardiologist  LABS: Labs reviewed: Acceptable for surgery. (all labs ordered are listed, but only abnormal results are displayed)  Labs Reviewed  GLUCOSE, CAPILLARY - Abnormal; Notable for the following components:      Result Value   Glucose-Capillary 123 (*)    All other components within normal limits  HEMOGLOBIN A1C - Abnormal; Notable for the following components:   Hgb A1c MFr Bld 6.6 (*)    All other components within normal limits  BASIC METABOLIC PANEL - Abnormal; Notable for the following components:   Glucose, Bld 118 (*)    BUN 28 (*)    Creatinine, Ser 2.20 (*)    Calcium 8.8 (*)    GFR, Estimated 31 (*)    All other components within normal limits  CBC - Abnormal; Notable for the following components:   Hemoglobin 11.6 (*)    HCT 36.1 (*)    All other components within normal limits     IMAGES:   EKG: 08/06/2021 Rate 82 bpm  Sinus rhythm  Nonspecific intraventricular conduction delay  CV: Echo 09/06/2016 Study Conclusions   - Left ventricle: The cavity size was normal. Wall thickness was    increased in a  pattern of moderate LVH. Systolic function was    vigorous. The estimated ejection fraction was in the range of 65%    to 70%. Wall motion was normal; there were no regional wall    motion abnormalities. Doppler parameters are consistent with    abnormal left ventricular relaxation (grade 1 diastolic    dysfunction).  - Aortic valve: Valve area (VTI): 2.67 cm^2. Valve area (Vmax):    2.21 cm^2. Valve area (Vmean): 2.53 cm^2.  - Atrial septum: No defect or patent foramen ovale was identified.  - Pulmonary arteries: Systolic pressure was moderately increased.    PA peak pressure: 40 mm Hg (S).  - Technically adequate study.  Past Medical History:  Diagnosis Date   CHF (congestive heart failure) (HCC)    Chronic cholecystitis    Chronic kidney disease    Diabetes mellitus without complication (HCC)    Type II   Hypertension    Rheumatoid arthritis ([place])    Vitamin D deficiency    Hx: of    Past Surgical History:  Procedure Laterality Date   AV FISTULA PLACEMENT Left 06/28/2013   Procedure: ARTERIOVENOUS (AV) FISTULA CREATION- Left arm with ultrasound guidance;  Surgeon: Rosetta Posner, MD;  Location: [place];  Service: Vascular;  Laterality: Left;   BIOPSY  06/28/2021   Procedure: BIOPSY;  Surgeon: Milus Banister, MD;  Location: [facility];  Service: Endoscopy;;   CHOLECYSTECTOMY N/A 10/28/2017   Procedure: LAPAROSCOPIC CHOLECYSTECTOMY WITH INTRAOPERATIVE CHOLANGIOGRAM;  Surgeon: Donnie Mesa, MD;  Location: [place];  Service: General;  Laterality: N/A;   COLONOSCOPY     hx: of   ESOPHAGOGASTRODUODENOSCOPY (EGD) WITH PROPOFOL N/A 06/28/2021   Procedure: ESOPHAGOGASTRODUODENOSCOPY (EGD) WITH PROPOFOL;  Surgeon: Milus Banister, MD;  Location: [facility];  Service: Endoscopy;  Laterality: N/A;   EUS N/A 06/28/2021   Procedure: UPPER ENDOSCOPIC ULTRASOUND (EUS) LINEAR;  Surgeon: Milus Banister, MD;  Location: [facility];  Service: Endoscopy;  Laterality: N/A;   FINE NEEDLE  ASPIRATION N/A 06/28/2021   Procedure: FINE NEEDLE ASPIRATION (FNA) LINEAR;  Surgeon: Milus Banister, MD;  Location: [facility];  Service: Endoscopy;  Laterality: N/A;   HERNIA REPAIR     INSERTION OF DIALYSIS CATHETER N/A 06/28/2013   Procedure: INSERTION OF DIALYSIS CATHETER;  Surgeon: Rosetta Posner, MD;  Location: [place];  Service: Vascular;  Laterality: N/A;   IR GENERIC HISTORICAL  09/08/2016   IR PERC CHOLECYSTOSTOMY 09/08/2016 [name], MD [facility]   IR GENERIC HISTORICAL  10/15/2016   IR RADIOLOGIST EVAL & MGMT 10/15/2016 Ardis Rowan, PA-C [facility]   IR GENERIC HISTORICAL  10/29/2016   IR RADIOLOGIST EVAL & MGMT 10/29/2016 Jacqulynn Cadet, MD [facility]   LIGATION OF COMPETING BRANCHES OF ARTERIOVENOUS FISTULA Left 10/12/2013   Procedure: LIGATION OF COMPETING BRANCHES OF ARTERIOVENOUS FISTULA;  Surgeon: Elam Dutch, MD;  Location: [facility];  Service: Vascular;  Laterality: Left;   neck surgery      MEDICATIONS:  acetaminophen (TYLENOL) 500 MG tablet   atorvastatin (LIPITOR) 40 MG tablet   Cholecalciferol (VITAMIN D3) 50 MCG (2000 UT) TABS   CINNAMON PO   cloNIDine (CATAPRES) 0.2 MG tablet   diltiazem (CARDIZEM CD) 180 MG 24 hr capsule   insulin glargine (LANTUS) 100 UNIT/ML injection   insulin lispro (HUMALOG) 100 UNIT/ML cartridge   Insulin Pen Needle (PEN NEEDLES) 31G X 6 MM MISC   lisinopril (ZESTRIL) 40 MG tablet   LORazepam (ATIVAN) 0.5 MG tablet   pantoprazole (PROTONIX) 40 MG tablet   predniSONE (DELTASONE) 10 MG tablet   sucralfate (CARAFATE) 1 g tablet   triamcinolone cream (KENALOG) 0.1 %   TRULICITY 1.5 YI/5.0YD SOPN   UNIFINE PENTIPS 31G X 5 MM MISC   No current facility-administered medications for this encounter.     Konrad Felix Ward, PA-C [facility] [phone]

## 2021-10-02 NOTE — H&P (Signed)
PROVIDER: Joya San, MD  MRN: [id number] DOB: 1951/08/27 DATE OF ENCOUNTER: 09/12/2021  Subjective   Chief Complaint: New Consultation (Hiatal hernia)   History of Present Illness: Gary Lang is a 71 y.o. male who is seen today as an office consultation at the request of Dr. None for evaluation of New Consultation (Hiatal hernia) .   He presents today with about a 107-month history of weight loss. He had an upper endoscopy by Dr. Ardis Hughs because there was some thickening of his esophagus to go along with his weight loss. He had a PET scan did not show any suspicious metastatic disease or primary cancer. He has had a symptomatic left inguinal hernia that is gotten larger and is bothering his ability to walk and do some things he likes to do. He presents today for consideration of repair of left inguinal hernia. He has a prior open right inguinal hernia.    Review of Systems: See HPI as well for other ROS.  ROS   Medical History: Past Medical History:  Diagnosis Date   Arthritis   Chronic kidney disease   Diabetes mellitus without complication (CMS-HCC)   GERD (gastroesophageal reflux disease)   Hypertension   Patient Active Problem List  Diagnosis   Abdominal distention, non-gaseous   Glomerulonephritis, focal sclerosing   Chronic kidney disease, stage 4 (severe) (CMS-HCC)   PAF (paroxysmal atrial fibrillation) (CMS-HCC)   Past Surgical History:  Procedure Laterality Date   CHOLECYSTECTOMY   HERNIA REPAIR    Allergies  Allergen Reactions   Abatacept Other (See Comments)  Renal issues   Glyburide Unknown  Renal issues   Metformin Other (See Comments)  Renal issues.   Methotrexate Other (See Comments)  Renal issues   Current Outpatient Medications on File Prior to Visit  Medication Sig Dispense Refill   atorvastatin (LIPITOR) 40 MG tablet   calcium carbonate 600 mg calcium (1,500 mg) Tab tablet Take by mouth   cholecalciferol (VITAMIN D3) 2,000 unit  tablet Take by mouth   cloNIDine HCL (CATAPRES) 0.2 MG tablet   diltiazem (CARDIZEM CD) 180 MG CD capsule 1 capsule   docosahexaenoic acid-epa 120-180 mg Cap Take by mouth   dulaglutide (TRULICITY) 1.5 WE/3.1 mL subcutaneous pen injector as directed   insulin GLARGINE (LANTUS U-100 INSULIN) injection (concentration 100 units/mL) as directed   insulin lispro (HUMALOG U-100 INSULIN) 100 unit/mL as directed   leflunomide (ARAVA) 20 MG tablet 1 tablet   pantoprazole (PROTONIX) 40 MG DR tablet Take 40 mg by mouth 2 (two) times daily   sucralfate (CARAFATE) 1 gram tablet TAKE 1 TABLET BY MOUTH 4 TIMES DAILY WITH MEALS AND AT BEDTIME   triamcinolone 0.1 % cream APPLY CREAM EXTERNALLY UP TO TWICE DAILY AS NEEDED. NOT TO FACE,GROIN OR UNDERARMS   No current facility-administered medications on file prior to visit.   Family History  Problem Relation Age of Onset   Obesity Son    Social History   Tobacco Use  Smoking Status Former   Years: 25.00   Types: Cigarettes  Smokeless Tobacco Never    Social History   Socioeconomic History   Marital status: Unknown  Tobacco Use   Smoking status: Former  Years: 25.00  Types: Cigarettes   Smokeless tobacco: Never  Vaping Use   Vaping Use: Never used  Substance and Sexual Activity   Alcohol use: Not Currently   Drug use: Never   Objective:   Vitals:  09/12/21 1344  BP: 130/64  Pulse: 72  SpO2:  98%  Weight: 93.2 kg (205 lb 6.4 oz)  Height: 186.7 cm (6' 1.5")   Body mass index is 26.73 kg/m.  Physical Exam General: Thin African-American man in no acute distress. Pleasant. HEENT : Unremarkable Chest: Clear Heart: Sinus rhythm without murmurs today Breast: Not examined Abdomen: Thin with obvious bulge in the left inguinal region consistent with a hernia. It does not readily reduce and is therefore likely incarcerated GU left inguinal hernia Rectal not performed Extremities arteriovenous fish in the left forearm but never  used Neuro alert and oriented x3. Motor and sensory function grossly intact.  Labs, Imaging and Diagnostic Testing:   Assessment and Plan:    Left inguinal hernia    A large left inguinal hernia that by PET scan appears to contain fat and no bowel. The patient really wants to have this repaired. It sounds like he also has some stomach growling issues and borborygmus. If we do with the robot we can look around his abdomen prior to robotic left inguinal hernia repair. We will get this scheduled. I have explained hernia repair with him and will go over this him again when prepared to do him.  Questions answered and patient is ready for left inguinal hernia repair.      Wai Minotti Donia Pounds, MD

## 2021-10-05 ENCOUNTER — Encounter (HOSPITAL_COMMUNITY): Payer: Self-pay | Admitting: Surgery

## 2021-10-05 ENCOUNTER — Encounter (HOSPITAL_COMMUNITY)
Admission: RE | Disposition: A | Discharge status: Home / Self Care | Payer: Self-pay | Source: Ambulatory Visit | Attending: Surgery

## 2021-10-05 ENCOUNTER — Ambulatory Visit (HOSPITAL_COMMUNITY)
Admission: RE | Admit: 2021-10-05 | Discharge: 2021-10-05 | Disposition: A | Discharge status: Home / Self Care | Payer: Medicare Other | Source: Ambulatory Visit | Attending: Surgery | Admitting: Surgery

## 2021-10-05 ENCOUNTER — Ambulatory Visit (HOSPITAL_COMMUNITY): Payer: Medicare Other | Admitting: Certified Registered Nurse Anesthetist

## 2021-10-05 ENCOUNTER — Ambulatory Visit (HOSPITAL_COMMUNITY): Payer: Medicare Other | Admitting: Physician Assistant

## 2021-10-05 DIAGNOSIS — Z6826 Body mass index (BMI) 26.0-26.9, adult: Secondary | ICD-10-CM | POA: Insufficient documentation

## 2021-10-05 DIAGNOSIS — K449 Diaphragmatic hernia without obstruction or gangrene: Secondary | ICD-10-CM | POA: Diagnosis not present

## 2021-10-05 DIAGNOSIS — N184 Chronic kidney disease, stage 4 (severe): Secondary | ICD-10-CM | POA: Diagnosis not present

## 2021-10-05 DIAGNOSIS — R634 Abnormal weight loss: Secondary | ICD-10-CM | POA: Insufficient documentation

## 2021-10-05 DIAGNOSIS — K4030 Unilateral inguinal hernia, with obstruction, without gangrene, not specified as recurrent: Secondary | ICD-10-CM | POA: Diagnosis not present

## 2021-10-05 DIAGNOSIS — E119 Type 2 diabetes mellitus without complications: Secondary | ICD-10-CM | POA: Diagnosis not present

## 2021-10-05 DIAGNOSIS — K4090 Unilateral inguinal hernia, without obstruction or gangrene, not specified as recurrent: Secondary | ICD-10-CM | POA: Diagnosis not present

## 2021-10-05 DIAGNOSIS — I129 Hypertensive chronic kidney disease with stage 1 through stage 4 chronic kidney disease, or unspecified chronic kidney disease: Secondary | ICD-10-CM | POA: Diagnosis not present

## 2021-10-05 DIAGNOSIS — Z87891 Personal history of nicotine dependence: Secondary | ICD-10-CM | POA: Insufficient documentation

## 2021-10-05 DIAGNOSIS — K219 Gastro-esophageal reflux disease without esophagitis: Secondary | ICD-10-CM | POA: Insufficient documentation

## 2021-10-05 DIAGNOSIS — K660 Peritoneal adhesions (postprocedural) (postinfection): Secondary | ICD-10-CM | POA: Insufficient documentation

## 2021-10-05 DIAGNOSIS — E1122 Type 2 diabetes mellitus with diabetic chronic kidney disease: Secondary | ICD-10-CM | POA: Insufficient documentation

## 2021-10-05 LAB — GLUCOSE, CAPILLARY
Glucose-Capillary: 116 mg/dL — ABNORMAL HIGH (ref 70–99)
Glucose-Capillary: 161 mg/dL — ABNORMAL HIGH (ref 70–99)

## 2021-10-05 SURGERY — HERNIORRHAPHY, INGUINAL, ROBOT-ASSISTED, LAPAROSCOPIC
Anesthesia: General | Site: Abdomen | Laterality: Left

## 2021-10-05 MED ORDER — 0.9 % SODIUM CHLORIDE (POUR BTL) OPTIME
TOPICAL | Status: DC | PRN
  Administered 2021-10-05: 1000 mL

## 2021-10-05 MED ORDER — HYDROCODONE-ACETAMINOPHEN 5-325 MG PO TABS: 1.0000 | ORAL_TABLET | Freq: Four times a day (QID) | ORAL | 0 refills | Status: DC | PRN

## 2021-10-05 MED ORDER — OXYCODONE HCL 5 MG PO TABS
ORAL_TABLET | ORAL | Status: AC
  Administered 2021-10-05: 5 mg via ORAL
  Filled 2021-10-05: qty 1

## 2021-10-05 MED ORDER — BUPIVACAINE LIPOSOME 1.3 % IJ SUSP
INTRAMUSCULAR | Status: DC | PRN
  Administered 2021-10-05: 20 mL

## 2021-10-05 MED ORDER — PROMETHAZINE HCL 25 MG/ML IJ SOLN: 6.2500 mg | INTRAMUSCULAR | Status: DC | PRN

## 2021-10-05 MED ORDER — ACETAMINOPHEN 500 MG PO TABS
1000.0000 mg | ORAL_TABLET | ORAL | Status: AC
  Administered 2021-10-05: 1000 mg via ORAL
  Filled 2021-10-05: qty 2

## 2021-10-05 MED ORDER — HYDROMORPHONE HCL 1 MG/ML IJ SOLN
INTRAMUSCULAR | Status: AC
  Filled 2021-10-05: qty 1

## 2021-10-05 MED ORDER — FENTANYL CITRATE (PF) 100 MCG/2ML IJ SOLN
INTRAMUSCULAR | Status: AC
  Filled 2021-10-05: qty 2

## 2021-10-05 MED ORDER — BUPIVACAINE LIPOSOME 1.3 % IJ SUSP
INTRAMUSCULAR | Status: AC
  Filled 2021-10-05: qty 20

## 2021-10-05 MED ORDER — LACTATED RINGERS IV SOLN: INTRAVENOUS | Status: DC

## 2021-10-05 MED ORDER — CEFAZOLIN SODIUM-DEXTROSE 2-4 GM/100ML-% IV SOLN
2.0000 g | INTRAVENOUS | Status: AC
  Administered 2021-10-05: 2 g via INTRAVENOUS
  Filled 2021-10-05: qty 100

## 2021-10-05 MED ORDER — PHENYLEPHRINE HCL (PRESSORS) 10 MG/ML IV SOLN
INTRAVENOUS | Status: DC | PRN
  Administered 2021-10-05: 40 ug via INTRAVENOUS
  Administered 2021-10-05: 80 ug via INTRAVENOUS
  Administered 2021-10-05 (×2): 40 ug via INTRAVENOUS

## 2021-10-05 MED ORDER — ONDANSETRON HCL 4 MG/2ML IJ SOLN
INTRAMUSCULAR | Status: AC
  Filled 2021-10-05: qty 2

## 2021-10-05 MED ORDER — FENTANYL CITRATE (PF) 100 MCG/2ML IJ SOLN
INTRAMUSCULAR | Status: DC | PRN
Start: 2021-10-05 — End: 2021-10-05
  Administered 2021-10-05 (×6): 50 ug via INTRAVENOUS

## 2021-10-05 MED ORDER — LIDOCAINE 2% (20 MG/ML) 5 ML SYRINGE
INTRAMUSCULAR | Status: DC | PRN
  Administered 2021-10-05: 60 mg via INTRAVENOUS

## 2021-10-05 MED ORDER — CHLORHEXIDINE GLUCONATE CLOTH 2 % EX PADS: 6.0000 | MEDICATED_PAD | Freq: Once | CUTANEOUS | Status: DC

## 2021-10-05 MED ORDER — ONDANSETRON HCL 4 MG/2ML IJ SOLN
INTRAMUSCULAR | Status: DC | PRN
Start: 2021-10-05 — End: 2021-10-05
  Administered 2021-10-05: 4 mg via INTRAVENOUS

## 2021-10-05 MED ORDER — ROCURONIUM BROMIDE 10 MG/ML (PF) SYRINGE
PREFILLED_SYRINGE | INTRAVENOUS | Status: DC | PRN
  Administered 2021-10-05 (×2): 10 mg via INTRAVENOUS
  Administered 2021-10-05: 70 mg via INTRAVENOUS

## 2021-10-05 MED ORDER — ORAL CARE MOUTH RINSE: 15.0000 mL | Freq: Once | OROMUCOSAL | Status: AC

## 2021-10-05 MED ORDER — ROCURONIUM BROMIDE 10 MG/ML (PF) SYRINGE
PREFILLED_SYRINGE | INTRAVENOUS | Status: AC
  Filled 2021-10-05: qty 10

## 2021-10-05 MED ORDER — CHLORHEXIDINE GLUCONATE 0.12 % MT SOLN
15.0000 mL | Freq: Once | OROMUCOSAL | Status: AC
  Administered 2021-10-05: 15 mL via OROMUCOSAL

## 2021-10-05 MED ORDER — OXYCODONE HCL 5 MG PO TABS: 5.0000 mg | ORAL_TABLET | Freq: Once | ORAL | Status: AC | PRN

## 2021-10-05 MED ORDER — BUPIVACAINE LIPOSOME 1.3 % IJ SUSP: 20.0000 mL | Freq: Once | INTRAMUSCULAR | Status: DC

## 2021-10-05 MED ORDER — SUGAMMADEX SODIUM 200 MG/2ML IV SOLN
INTRAVENOUS | Status: DC | PRN
  Administered 2021-10-05: 270 mg via INTRAVENOUS

## 2021-10-05 MED ORDER — PHENYLEPHRINE HCL-NACL 20-0.9 MG/250ML-% IV SOLN
INTRAVENOUS | Status: DC | PRN
Start: 2021-10-05 — End: 2021-10-05
  Administered 2021-10-05: 25 ug/min via INTRAVENOUS

## 2021-10-05 MED ORDER — DEXAMETHASONE SODIUM PHOSPHATE 10 MG/ML IJ SOLN
INTRAMUSCULAR | Status: DC | PRN
  Administered 2021-10-05: 8 mg via INTRAVENOUS

## 2021-10-05 MED ORDER — LIDOCAINE HCL (PF) 2 % IJ SOLN
INTRAMUSCULAR | Status: DC | PRN
  Administered 2021-10-05: 1.5 mg/kg/h via INTRADERMAL

## 2021-10-05 MED ORDER — LIDOCAINE HCL (PF) 2 % IJ SOLN
INTRAMUSCULAR | Status: AC
  Filled 2021-10-05: qty 5

## 2021-10-05 MED ORDER — HYDROMORPHONE HCL 1 MG/ML IJ SOLN
0.2500 mg | INTRAMUSCULAR | Status: DC | PRN
  Administered 2021-10-05: 0.25 mg via INTRAVENOUS

## 2021-10-05 MED ORDER — EPHEDRINE SULFATE-NACL 50-0.9 MG/10ML-% IV SOSY
PREFILLED_SYRINGE | INTRAVENOUS | Status: DC | PRN
  Administered 2021-10-05 (×4): 5 mg via INTRAVENOUS

## 2021-10-05 MED ORDER — KETAMINE HCL 50 MG/5ML IJ SOSY
PREFILLED_SYRINGE | INTRAMUSCULAR | Status: AC
  Filled 2021-10-05: qty 5

## 2021-10-05 MED ORDER — DEXAMETHASONE SODIUM PHOSPHATE 10 MG/ML IJ SOLN
INTRAMUSCULAR | Status: AC
  Filled 2021-10-05: qty 1

## 2021-10-05 MED ORDER — PROPOFOL 10 MG/ML IV BOLUS
INTRAVENOUS | Status: DC | PRN
Start: 2021-10-05 — End: 2021-10-05
  Administered 2021-10-05: 60 mg via INTRAVENOUS
  Administered 2021-10-05: 110 mg via INTRAVENOUS

## 2021-10-05 MED ORDER — OXYCODONE HCL 5 MG/5ML PO SOLN
5.0000 mg | Freq: Once | ORAL | Status: AC | PRN
Start: 2021-10-05 — End: 2021-10-05

## 2021-10-05 MED ORDER — HEPARIN SODIUM (PORCINE) 5000 UNIT/ML IJ SOLN
5000.0000 [IU] | Freq: Once | INTRAMUSCULAR | Status: AC
  Administered 2021-10-05: 5000 [IU] via SUBCUTANEOUS
  Filled 2021-10-05: qty 1

## 2021-10-05 MED ORDER — PROPOFOL 10 MG/ML IV BOLUS
INTRAVENOUS | Status: AC
  Filled 2021-10-05: qty 20

## 2021-10-05 MED ORDER — SCOPOLAMINE 1 MG/3DAYS TD PT72
1.0000 | MEDICATED_PATCH | TRANSDERMAL | Status: DC
  Administered 2021-10-05: 1.5 mg via TRANSDERMAL
  Filled 2021-10-05: qty 1

## 2021-10-05 MED ORDER — EPHEDRINE 5 MG/ML INJ
INTRAVENOUS | Status: AC
  Filled 2021-10-05: qty 5

## 2021-10-05 SURGICAL SUPPLY — 66 items
ADH SKN CLS APL DERMABOND .7 (GAUZE/BANDAGES/DRESSINGS) ×1
APL PRP STRL LF DISP 70% ISPRP (MISCELLANEOUS) ×1
APL SWBSTK 6 STRL LF DISP (MISCELLANEOUS) ×2
APPLICATOR COTTON TIP 6 STRL (MISCELLANEOUS) ×2 IMPLANT
APPLICATOR COTTON TIP 6IN STRL (MISCELLANEOUS) ×4
BAG COUNTER SPONGE SURGICOUNT (BAG) IMPLANT
BAG SPNG CNTER NS LX DISP (BAG)
BLADE SURG 15 STRL LF DISP TIS (BLADE) ×1 IMPLANT
BLADE SURG 15 STRL SS (BLADE) ×2
CANNULA REDUC XI 12-8 STAPL (CANNULA) ×2
CANNULA REDUCER 12-8 DVNC XI (CANNULA) ×1 IMPLANT
CHLORAPREP W/TINT 26 (MISCELLANEOUS) ×2 IMPLANT
COVER MAYO STAND STRL (DRAPES) ×2 IMPLANT
COVER SURGICAL LIGHT HANDLE (MISCELLANEOUS) ×2 IMPLANT
COVER TIP SHEARS 8 DVNC (MISCELLANEOUS) ×1 IMPLANT
COVER TIP SHEARS 8MM DA VINCI (MISCELLANEOUS) ×2
DERMABOND ADVANCED (GAUZE/BANDAGES/DRESSINGS) ×1
DERMABOND ADVANCED .7 DNX12 (GAUZE/BANDAGES/DRESSINGS) IMPLANT
DRAIN PENROSE 0.5X18 (DRAIN) ×1 IMPLANT
DRAPE ARM DVNC X/XI (DISPOSABLE) ×3 IMPLANT
DRAPE COLUMN DVNC XI (DISPOSABLE) ×1 IMPLANT
DRAPE DA VINCI XI ARM (DISPOSABLE) ×6
DRAPE DA VINCI XI COLUMN (DISPOSABLE) ×2
ELECT PENCIL ROCKER SW 15FT (MISCELLANEOUS) ×1 IMPLANT
ELECT REM PT RETURN 15FT ADLT (MISCELLANEOUS) ×2 IMPLANT
GLOVE SURG ENC TEXT LTX SZ8 (GLOVE) ×4 IMPLANT
GOWN STRL REUS W/TWL XL LVL3 (GOWN DISPOSABLE) ×6 IMPLANT
GRASPER SUT TROCAR 14GX15 (MISCELLANEOUS) ×2 IMPLANT
IRRIG SUCT STRYKERFLOW 2 WTIP (MISCELLANEOUS) ×2
IRRIGATION SUCT STRKRFLW 2 WTP (MISCELLANEOUS) ×1 IMPLANT
KIT BASIN OR (CUSTOM PROCEDURE TRAY) ×2 IMPLANT
KIT TURNOVER KIT A (KITS) IMPLANT
MARKER SKIN DUAL TIP RULER LAB (MISCELLANEOUS) ×2 IMPLANT
MESH HERNIA 3X6 (Mesh General) ×1 IMPLANT
NEEDLE HYPO 22GX1.5 SAFETY (NEEDLE) ×2 IMPLANT
OBTURATOR OPTICAL STANDARD 8MM (TROCAR) ×2
OBTURATOR OPTICAL STND 8 DVNC (TROCAR) ×1
OBTURATOR OPTICALSTD 8 DVNC (TROCAR) ×1 IMPLANT
PACK CARDIOVASCULAR III (CUSTOM PROCEDURE TRAY) ×2 IMPLANT
PAD POSITIONING PINK XL (MISCELLANEOUS) ×1 IMPLANT
SCISSORS LAP 5X35 DISP (ENDOMECHANICALS) ×1 IMPLANT
SEAL CANN UNIV 5-8 DVNC XI (MISCELLANEOUS) ×2 IMPLANT
SEAL XI 5MM-8MM UNIVERSAL (MISCELLANEOUS) ×6
SOL ANTI FOG 6CC (MISCELLANEOUS) ×1 IMPLANT
SOLUTION ANTI FOG 6CC (MISCELLANEOUS) ×1
SOLUTION ELECTROLUBE (MISCELLANEOUS) ×2 IMPLANT
SPIKE FLUID TRANSFER (MISCELLANEOUS) ×2 IMPLANT
SPONGE T-LAP 18X18 ~~LOC~~+RFID (SPONGE) ×2 IMPLANT
STAPLER CANNULA SEAL DVNC XI (STAPLE) ×1 IMPLANT
STAPLER CANNULA SEAL XI (STAPLE) ×2
SUT MNCRL AB 4-0 PS2 18 (SUTURE) ×4 IMPLANT
SUT PROLENE 0 CT 1 CR/8 (SUTURE) ×2 IMPLANT
SUT VIC AB 0 CT2 27 (SUTURE) ×1 IMPLANT
SUT VIC AB 2-0 SH 27 (SUTURE) ×2
SUT VIC AB 2-0 SH 27X BRD (SUTURE) ×1 IMPLANT
SUT VIC AB 4-0 SH 18 (SUTURE) ×1 IMPLANT
SUT VICRYL 0 TIES 12 18 (SUTURE) ×2 IMPLANT
SUT VLOC 180 2-0 6IN GS21 (SUTURE) IMPLANT
SYR 10ML ECCENTRIC (SYRINGE) ×2 IMPLANT
SYR 20ML LL LF (SYRINGE) ×2 IMPLANT
TAPE STRIPS DRAPE STRL (GAUZE/BANDAGES/DRESSINGS) ×2 IMPLANT
TOWEL OR 17X26 10 PK STRL BLUE (TOWEL DISPOSABLE) ×2 IMPLANT
TOWEL OR NON WOVEN STRL DISP B (DISPOSABLE) ×2 IMPLANT
TROCAR ADV FIXATION 12X100MM (TROCAR) IMPLANT
TROCAR BLADELESS OPT 5 100 (ENDOMECHANICALS) ×2 IMPLANT
TUBING INSUFFLATION 10FT LAP (TUBING) ×2 IMPLANT

## 2021-10-05 NOTE — Anesthesia Procedure Notes (Addendum)
Procedure Name: Intubation Date/Time: 10/05/2021 9:58 AM Performed by: West Pugh, CRNA Pre-anesthesia Checklist: Patient identified, Emergency Drugs available, Suction available, Patient being monitored and Timeout performed Patient Re-evaluated:Patient Re-evaluated prior to induction Oxygen Delivery Method: Circle system utilized Preoxygenation: Pre-oxygenation with 100% oxygen Induction Type: IV induction Ventilation: Mask ventilation without difficulty Laryngoscope Size: Mac and 4 Grade View: Grade I Tube type: Oral Tube size: 7.5 mm Number of attempts: 1 Airway Equipment and Method: Stylet Placement Confirmation: ETT inserted through vocal cords under direct vision, positive ETCO2, CO2 detector and breath sounds checked- equal and bilateral Secured at: 22 cm Tube secured with: Tape Dental Injury: Teeth and Oropharynx as per pre-operative assessment

## 2021-10-05 NOTE — Anesthesia Preprocedure Evaluation (Addendum)
Anesthesia Evaluation  Patient identified by MRN, date of birth, ID band Patient awake    Reviewed: Allergy & Precautions, NPO status , Patient's Chart, lab work & pertinent test results  Airway Mallampati: II  TM Distance: >3 FB Neck ROM: Full    Dental  (+) Dental Advisory Given, Missing, Poor Dentition,    Pulmonary pneumonia, former smoker,    Pulmonary exam normal breath sounds clear to auscultation       Cardiovascular hypertension, Pt. on medications pulmonary hypertension+CHF  Normal cardiovascular exam Rhythm:Regular Rate:Normal  Echo 2018 - Left ventricle: The cavity size was normal. Wall thickness was increased in a pattern of moderate LVH. Systolic function was vigorous. The estimated ejection fraction was in the range of 65% to 70%. Wall motion was normal; there were no regional wall motion abnormalities. Doppler parameters are consistent with abnormal left ventricular relaxation (grade 1 diastolic dysfunction).  - Aortic valve: Valve area (VTI): 2.67 cm^2. Valve area (Vmax): 2.21 cm^2. Valve area (Vmean): 2.53 cm^2.  - Atrial septum: No defect or patent foramen ovale was identified.  - Pulmonary arteries: Systolic pressure was moderately increased. PA peak pressure: 40 mm Hg (S).  - Technically adequate study.    Neuro/Psych negative neurological ROS     GI/Hepatic Neg liver ROS, GERD  ,  Endo/Other  diabetes, Type 2  Renal/GU Renal disease     Musculoskeletal  (+) Arthritis ,   Abdominal   Peds  Hematology negative hematology ROS (+)   Anesthesia Other Findings   Reproductive/Obstetrics                            Anesthesia Physical  Anesthesia Plan  ASA: 4  Anesthesia Plan: General   Post-op Pain Management: Minimal or no pain anticipated and Tylenol PO (pre-op)   Induction: Intravenous  PONV Risk Score and Plan: 1 and Propofol infusion, Ondansetron, Treatment may  vary due to age or medical condition and Dexamethasone  Airway Management Planned: Oral ETT  Additional Equipment: None  Intra-op Plan:   Post-operative Plan: Extubation in OR  Informed Consent: I have reviewed the patients History and Physical, chart, labs and discussed the procedure including the risks, benefits and alternatives for the proposed anesthesia with the patient or authorized representative who has indicated his/her understanding and acceptance.     Dental advisory given  Plan Discussed with: CRNA  Anesthesia Plan Comments: ([place])       Anesthesia Quick Evaluation

## 2021-10-05 NOTE — Op Note (Signed)
ASHAD FAWBUSH Sr.  November 16, 1950   10/05/2021    PCP:  Celene Squibb, MD   Surgeon: Kaylyn Lim, MD, FACS  Asst:  none  Anes:  general  Preop Dx: Left inguinal hernia; incarcerated Postop Dx: Left incarcerated indirect inguinal hernia;  multiple adhesions  Procedure: Laparoscopy, enterolysis, open left inguinal hernia with mesh Location Surgery: [facility] Complications: None noted  EBL:   minimal cc  Drains: none  Description of Procedure:  The patient was taken to OR 5 .  After anesthesia was administered and the patient was prepped  with ChloraPrep and also Betadine in the scrotal region where a Foley catheter was inserted.  And a timeout was performed.  Timeout was performed and the plan was to do a robotic left inguinal hernia.  I went ahead and placed my robotic trochars and and surveyed the abdomen but found adhesions to his previous lap chole umbilical port more importantly down in the lower midline there was incarcerated fat stuck down to the bladder and over into this herniated area.  In taking this down we basically came through the preperitoneal and peritoneal plane where we would normally reduce hernia with the robot.  I felt like after I taken down most all the adhesions in the pelvis that I would do this open and it would be more effective and safer.  Also I did not have a good reason for these adhesions in the lower abdomen.  He said a proper prior right inguinal hernia repair and also a cholecystectomy done laparoscopically.  So I maintained the insufflation made an oblique incision in the right groin and and dissected free a large left indirect inguinal hernia.  I did already reduce a lot of the fat from this when I was working intra-abdominal he.  However this was a scrotal hernia detached it from the epididymis and testicle and then freed it from the cord structures preserving the vessels and the vas and then I reduced it completely into the abdomen.  I did not do a high  ligation but instead reduce that and also if fatty mass that was with it.  Once this was reduced I then closed the internal ring with 2 sutures of 0 Prolene to snug up the ring and then repaired the floor with interrupted 0 Prolene and a piece of Marlex mesh cut to fit and then sutured around itself laterally.  This was all tucked beneath the external oblique and then the external oblique was closed with a running 0 Vicryl.  I closed in layers with 4-0 Vicryl and with a running subcuticular 4 Monocryl.  I injected the left anterior superior leg spine with Exparel.  I also repaired the 12 mm trocar on the right upper quadrant with a 0 Vicryl using the PMI.  We surveyed the abdomen at the completion with the scope and everything looked to be in order.  I can see the hernia repair in the reduced sac.  Patient seemed to tolerate the procedure well was taken recovery room in satisfactory condition  The patient tolerated the procedure well and was taken to the PACU in stable condition.     Matt B. Hassell Done, [place], [facility], [place]

## 2021-10-05 NOTE — Transfer of Care (Signed)
Immediate Anesthesia Transfer of Care Note  Patient: Gary L Toon Sr.  Procedure(s) Performed: LAPAROSCOPY ENTEROLYSIS OF EXTENSIVE ADHESIONS WITH OPEN INDIRECT REPAIR OF LEFT INGUINAL HERNIA WITH MESH (Left: Abdomen)  Patient Location: PACU  Anesthesia Type:General  Level of Consciousness: awake, drowsy and patient cooperative  Airway & Oxygen Therapy: Patient Spontanous Breathing and Patient connected to face mask oxygen  Post-op Assessment: Report given to RN and Post -op Vital signs reviewed and stable  Post vital signs: Reviewed and stable  Last Vitals:  Vitals Value Taken Time  BP 190/72 10/05/21 1300  Temp 36.6 C 10/05/21 1300  Pulse 75 10/05/21 1306  Resp 0 10/05/21 1305  SpO2 98 % 10/05/21 1306  Vitals shown include unvalidated device data.  Last Pain:  Vitals:   10/05/21 0855  TempSrc:   PainSc: 0-No pain      Patients Stated Pain Goal: 3 (38/33/38 3291)  Complications: No notable events documented.

## 2021-10-05 NOTE — Anesthesia Postprocedure Evaluation (Signed)
Anesthesia Post Note  Patient: Dravon L Kruczek Sr.  Procedure(s) Performed: LAPAROSCOPY ENTEROLYSIS OF EXTENSIVE ADHESIONS WITH OPEN INDIRECT REPAIR OF LEFT INGUINAL HERNIA WITH MESH (Left: Abdomen)     Patient location during evaluation: PACU Anesthesia Type: General Level of consciousness: sedated and patient cooperative Pain management: pain level controlled Vital Signs Assessment: post-procedure vital signs reviewed and stable Respiratory status: spontaneous breathing Cardiovascular status: stable Anesthetic complications: no   No notable events documented.  Last Vitals:  Vitals:   10/05/21 1430 10/05/21 1445  BP: (!) 189/68 (!) 172/91  Pulse: 73 77  Resp: 19 15  Temp:  36.6 C  SpO2: 98% 100%    Last Pain:  Vitals:   10/05/21 1445  TempSrc:   PainSc: 0-No pain                 Nolon Nations

## 2021-10-05 NOTE — Interval H&P Note (Signed)
History and Physical Interval Note:  10/05/2021 9:20 AM  Gary L Cupp Sr.  has presented today for surgery, with the diagnosis of left inguinal hernia.  The various methods of treatment have been discussed with the patient and family. After consideration of risks, benefits and other options for treatment, the patient has consented to  Procedure(s): XI ROBOTIC ASSISTED LEFT INGUINAL HERNIA REPAIR (Left) as a surgical intervention.  The patient's history has been reviewed, patient examined, no change in status, stable for surgery.  I have reviewed the patient's chart and labs.  Questions were answered to the patient's satisfaction.     Pedro Earls

## 2021-10-09 ENCOUNTER — Ambulatory Visit: Payer: Medicare Other | Admitting: Gastroenterology

## 2021-10-24 DIAGNOSIS — E782 Mixed hyperlipidemia: Secondary | ICD-10-CM | POA: Diagnosis not present

## 2021-10-24 DIAGNOSIS — E1122 Type 2 diabetes mellitus with diabetic chronic kidney disease: Secondary | ICD-10-CM | POA: Diagnosis not present

## 2021-10-29 DIAGNOSIS — D72829 Elevated white blood cell count, unspecified: Secondary | ICD-10-CM | POA: Diagnosis not present

## 2021-10-29 DIAGNOSIS — E211 Secondary hyperparathyroidism, not elsewhere classified: Secondary | ICD-10-CM | POA: Diagnosis not present

## 2021-10-29 DIAGNOSIS — E782 Mixed hyperlipidemia: Secondary | ICD-10-CM | POA: Diagnosis not present

## 2021-10-29 DIAGNOSIS — M069 Rheumatoid arthritis, unspecified: Secondary | ICD-10-CM | POA: Diagnosis not present

## 2021-10-29 DIAGNOSIS — D638 Anemia in other chronic diseases classified elsewhere: Secondary | ICD-10-CM | POA: Diagnosis not present

## 2021-10-29 DIAGNOSIS — I480 Paroxysmal atrial fibrillation: Secondary | ICD-10-CM | POA: Diagnosis not present

## 2021-10-29 DIAGNOSIS — R809 Proteinuria, unspecified: Secondary | ICD-10-CM | POA: Diagnosis not present

## 2021-10-29 DIAGNOSIS — Z0001 Encounter for general adult medical examination with abnormal findings: Secondary | ICD-10-CM | POA: Diagnosis not present

## 2021-11-08 DIAGNOSIS — Z23 Encounter for immunization: Secondary | ICD-10-CM | POA: Diagnosis not present

## 2021-11-28 ENCOUNTER — Encounter: Payer: Self-pay | Admitting: Gastroenterology

## 2021-11-28 ENCOUNTER — Ambulatory Visit: Payer: Medicare Other | Admitting: Gastroenterology

## 2021-11-28 VITALS — BP 122/62 | HR 86 | Ht 73.5 in | Wt 213.0 lb

## 2021-11-28 DIAGNOSIS — R1310 Dysphagia, unspecified: Secondary | ICD-10-CM

## 2021-11-28 DIAGNOSIS — K209 Esophagitis, unspecified without bleeding: Secondary | ICD-10-CM | POA: Diagnosis not present

## 2021-11-28 NOTE — Progress Notes (Signed)
Review of pertinent gastrointestinal problems: ?1.  Weight loss, dysphagia fall/winter 2022; CT scan without IV contrast 05/2021 "no acute findings in the abdomen or pelvis..  Left inguinal hernia containing fat".  EGD 06/2021 slightly irregular GE junction biopsied extensively, mild nonspecific gastritis biopsied.  Negative for H. pylori, negative for neoplastic signs.  Endoscopic ultrasound 06/2021 thickened distal esophagus, soft proximal gastric small nodule.  Multiple samples taken including FNA of the wall of his esophagus.  None of the samples showed any sign of cancer, neoplasm.  CT scan chest 06/02/2021 "esophagus is poorly evaluated due to internal debris/fluid".  PET scan 08/2021 "no substantial degree of abnormal esophageal activity.  Thoracic esophageal wall thickening in the mid to distal esophagus possibly from esophagitis."  Twice daily proton pump inhibitor and a tincture of time led to complete resolution of his dysphagia and his weight loss improved by end of March 2023 ? ? ? ? ?HPI: ?This is a very pleasant 71 year old man whom I last saw several months ago.  See all those results summarized above ? ?He had a left inguinal hernia repaired last month laparoscopic converted to open.  Lots of mesh removed ? ?His weight at his last office visit here 4 months ago was 208 pounds.  Today his weight is 213 pounds ? ?He has really been doing well for the past month or 2.  His dysphagia has completely resolved.  He is getting his blood sugars under better control.  He is having no trouble with heartburn or swallowing ? ?He is recovering well from his left inguinal hernia repair ? ? ?ROS: complete GI ROS as described in HPI, all other review negative. ? ?Constitutional:  No unintentional weight loss ? ? ?Past Medical History:  ?Diagnosis Date  ? CHF (congestive heart failure) ([place])   ? Chronic cholecystitis   ? Chronic kidney disease   ? Diabetes mellitus without complication ([place])   ? Type II  ? Hypertension    ? Rheumatoid arthritis ([place])   ? Vitamin D deficiency   ? Hx: of  ? ? ?Past Surgical History:  ?Procedure Laterality Date  ? AV FISTULA PLACEMENT Left 06/28/2013  ? Procedure: ARTERIOVENOUS (AV) FISTULA CREATION- Left arm with ultrasound guidance;  Surgeon: Rosetta Posner, MD;  Location: [place];  Service: Vascular;  Laterality: Left;  ? BIOPSY  06/28/2021  ? Procedure: BIOPSY;  Surgeon: Milus Banister, MD;  Location: [facility];  Service: Endoscopy;;  ? CHOLECYSTECTOMY N/A 10/28/2017  ? Procedure: LAPAROSCOPIC CHOLECYSTECTOMY WITH INTRAOPERATIVE CHOLANGIOGRAM;  Surgeon: Donnie Mesa, MD;  Location: [place];  Service: General;  Laterality: N/A;  ? COLONOSCOPY    ? hx: of  ? ESOPHAGOGASTRODUODENOSCOPY (EGD) WITH PROPOFOL N/A 06/28/2021  ? Procedure: ESOPHAGOGASTRODUODENOSCOPY (EGD) WITH PROPOFOL;  Surgeon: Milus Banister, MD;  [facility];  Service: Endoscopy;  Laterality: N/A;  ? EUS N/A 06/28/2021  ? Procedure: UPPER ENDOSCOPIC ULTRASOUND (EUS) LINEAR;  Surgeon: Milus Banister, MD;  [facility];  Service: Endoscopy;  Laterality: N/A;  ? FINE NEEDLE ASPIRATION N/A 06/28/2021  ? Procedure: FINE NEEDLE ASPIRATION (FNA) LINEAR;  Surgeon: Milus Banister, MD;  [facility];  Service: Endoscopy;  Laterality: N/A;  ? HERNIA REPAIR    ? INSERTION OF DIALYSIS CATHETER N/A 06/28/2013  ? Procedure: INSERTION OF DIALYSIS CATHETER;  Surgeon: Rosetta Posner, MD;  Location: [place];  Service: Vascular;  Laterality: N/A;  ? IR GENERIC HISTORICAL  09/08/2016  ? IR PERC CHOLECYSTOSTOMY 09/08/2016 [name],  MD [facility]  ? IR GENERIC HISTORICAL  10/15/2016  ? IR RADIOLOGIST EVAL & MGMT 10/15/2016 Ardis Rowan, PA-C [facility]  ? IR GENERIC HISTORICAL  10/29/2016  ? IR RADIOLOGIST EVAL & MGMT 10/29/2016 Jacqulynn Cadet, MD [facility]  ? LIGATION OF COMPETING BRANCHES OF ARTERIOVENOUS FISTULA Left 10/12/2013  ? Procedure: LIGATION OF COMPETING BRANCHES OF ARTERIOVENOUS FISTULA;   Surgeon: Elam Dutch, MD;  Location: [place];  Service: Vascular;  Laterality: Left;  ? neck surgery    ? ? ?Current Outpatient Medications  ?Medication Instructions  ? acetaminophen (TYLENOL) 500-1,000 mg, Oral, Daily PRN  ? atorvastatin (LIPITOR) 40 mg, Oral, Every morning  ? CINNAMON PO 1 tablet, Oral, Daily  ? cloNIDine (CATAPRES) 0.2 mg, Oral, 3 times daily  ? diltiazem (CARDIZEM CD) 180 mg, Oral, 2 times daily  ? HumaLOG 10 Units, Subcutaneous, 3 times daily with meals  ? HYDROcodone-acetaminophen (NORCO/VICODIN) 5-325 MG tablet 1 tablet, Oral, Every 6 hours PRN  ? insulin glargine (LANTUS) 30 Units, Subcutaneous, Every morning  ? Insulin Pen Needle (PEN NEEDLES) 31G X 6 MM MISC Use 1 needle per injection as directed  ? lisinopril (ZESTRIL) 40 mg, Oral, Daily  ? LORazepam (ATIVAN) 0.5 mg, Oral, Daily PRN  ? pantoprazole (PROTONIX) 40 mg, Oral, 2 times daily  ? predniSONE (DELTASONE) 5 mg, Oral, Daily PRN  ? sucralfate (CARAFATE) 1 g, Oral, 3 times daily with meals & bedtime  ? triamcinolone cream (KENALOG) 0.1 % 1 application., Topical, 2 times daily PRN  ? Trulicity 1.5 mg, Subcutaneous, Every Wed  ? UNIFINE PENTIPS 31G X 5 MM MISC USE AS DIRECTED WITH INSULIN  ? Vitamin D3 2,000 Units, Oral, Every other day  ? ? ?Allergies as of 11/28/2021 - Review Complete 11/28/2021  ?Allergen Reaction Noted  ? Glyburide  06/18/2013  ? Metformin and related Other (See Comments) 06/27/2021  ? Methotrexate derivatives Other (See Comments) 06/18/2013  ? Orencia [abatacept] Other (See Comments) 06/27/2021  ? ? ?Family History  ?Problem Relation Age of Onset  ? Cancer Mother   ? Arthritis/Rheumatoid Sister   ? Diabetes Child   ?     13  ? Colon polyps Neg Hx   ? Rectal cancer Neg Hx   ? ? ?Social History  ? ?Socioeconomic History  ? Marital status: Married  ?  Spouse name: Not on file  ? Number of children: Not on file  ? Years of education: Not on file  ? Highest education level: Not on file  ?Occupational History  ? Not on  file  ?Tobacco Use  ? Smoking status: Former  ?  Packs/day: 0.50  ?  Years: 10.00  ?  Pack years: 5.00  ?  Types: Cigarettes  ?  Quit date: 09/02/1976  ?  Years since quitting: 45.2  ? Smokeless tobacco: Never  ?Vaping Use  ? Vaping Use: Never used  ?Substance and Sexual Activity  ? Alcohol use: No  ? Drug use: No  ? Sexual activity: Not on file  ?Other Topics Concern  ? Not on file  ?Social History Narrative  ? Not on file  ? ?Social Determinants of Health  ? ?Financial Resource Strain: Not on file  ?Food Insecurity: Not on file  ?Transportation Needs: Not on file  ?Physical Activity: Not on file  ?Stress: Not on file  ?Social Connections: Not on file  ?Intimate Partner Violence: Not on file  ? ? ? ?Physical Exam: ?BP 122/62   Pulse  86   Ht 6' 1.5" (1.867 m)   Wt 213 lb (96.6 kg)   BMI 27.72 kg/m?  ?Constitutional: generally well-appearing ?Psychiatric: alert and oriented x3 ?Abdomen: soft, nontender, nondistended, no obvious ascites, no peritoneal signs, normal bowel sounds ?No peripheral edema noted in lower extremities ? ?Assessment and plan: ?71 y.o. male with resolved dysphagia, resolved weight loss ? ?I think a lot of his weight loss might have been from his out-of-control diabetes.  His dysphagia is almost certainly from esophagitis after the extensive esophageal work-up late 2022.  His dysphagia has completely resolved.  I recommended that he continue on his proton pump inhibitor twice daily for now and he will return to see me in 6 months and sooner if needed. ? ?Please see the "Patient Instructions" section for addition details about the plan. ? ?Owens Loffler, MD ?[facility] ?11/28/2021, 8:30 AM ? ? ?Total time on date of encounter was 20 minutes (this included time spent preparing to see the patient reviewing records; obtaining and/or reviewing separately obtained history; performing a medically appropriate exam and/or evaluation; counseling and educating the patient and family if  present; ordering medications, tests or procedures if applicable; and documenting clinical information in the health record). ? ?

## 2021-11-28 NOTE — Patient Instructions (Signed)
If you are age 72 or older, your body mass index should be between 23-30. Your Body mass index is 27.72 kg/m?Marland Kitchen If this is out of the aforementioned range listed, please consider follow up with your Primary Care Provider. ?________________________________________________________ ? ?The [facility] providers would like to encourage you to use [facility] to communicate with providers for non-urgent requests or questions.  Due to long hold times on the telephone, sending your provider a message by [facility] may be a faster and more efficient way to get a response.  Please allow 48 business hours for a response.  Please remember that this is for non-urgent requests.  ?_______________________________________________________ ? ?CONTINUE: pantoprazole 40mg  one tablet twice daily  ? ?You will need a follow up appointment in 6 months.  We will contact you to schedule this appointment. ? ?Thank you for entrusting me with your care and choosing [facility]. ? ?Dr Ardis Hughs ? ?

## 2021-12-10 DIAGNOSIS — I770 Arteriovenous fistula, acquired: Secondary | ICD-10-CM | POA: Diagnosis not present

## 2021-12-10 DIAGNOSIS — N051 Unspecified nephritic syndrome with focal and segmental glomerular lesions: Secondary | ICD-10-CM | POA: Diagnosis not present

## 2021-12-10 DIAGNOSIS — N2581 Secondary hyperparathyroidism of renal origin: Secondary | ICD-10-CM | POA: Diagnosis not present

## 2021-12-10 DIAGNOSIS — D631 Anemia in chronic kidney disease: Secondary | ICD-10-CM | POA: Diagnosis not present

## 2021-12-10 DIAGNOSIS — E872 Acidosis, unspecified: Secondary | ICD-10-CM | POA: Diagnosis not present

## 2021-12-10 DIAGNOSIS — N189 Chronic kidney disease, unspecified: Secondary | ICD-10-CM | POA: Diagnosis not present

## 2021-12-10 DIAGNOSIS — I129 Hypertensive chronic kidney disease with stage 1 through stage 4 chronic kidney disease, or unspecified chronic kidney disease: Secondary | ICD-10-CM | POA: Diagnosis not present

## 2021-12-10 DIAGNOSIS — N1832 Chronic kidney disease, stage 3b: Secondary | ICD-10-CM | POA: Diagnosis not present

## 2022-01-16 DIAGNOSIS — N1832 Chronic kidney disease, stage 3b: Secondary | ICD-10-CM | POA: Diagnosis not present

## 2022-01-22 DIAGNOSIS — E1122 Type 2 diabetes mellitus with diabetic chronic kidney disease: Secondary | ICD-10-CM | POA: Diagnosis not present

## 2022-01-22 DIAGNOSIS — E782 Mixed hyperlipidemia: Secondary | ICD-10-CM | POA: Diagnosis not present

## 2022-01-29 DIAGNOSIS — G4701 Insomnia due to medical condition: Secondary | ICD-10-CM | POA: Diagnosis not present

## 2022-01-29 DIAGNOSIS — I10 Essential (primary) hypertension: Secondary | ICD-10-CM | POA: Diagnosis not present

## 2022-01-29 DIAGNOSIS — E782 Mixed hyperlipidemia: Secondary | ICD-10-CM | POA: Diagnosis not present

## 2022-01-29 DIAGNOSIS — R809 Proteinuria, unspecified: Secondary | ICD-10-CM | POA: Diagnosis not present

## 2022-01-29 DIAGNOSIS — D638 Anemia in other chronic diseases classified elsewhere: Secondary | ICD-10-CM | POA: Diagnosis not present

## 2022-01-29 DIAGNOSIS — M069 Rheumatoid arthritis, unspecified: Secondary | ICD-10-CM | POA: Diagnosis not present

## 2022-01-29 DIAGNOSIS — D72829 Elevated white blood cell count, unspecified: Secondary | ICD-10-CM | POA: Diagnosis not present

## 2022-01-29 DIAGNOSIS — E211 Secondary hyperparathyroidism, not elsewhere classified: Secondary | ICD-10-CM | POA: Diagnosis not present

## 2022-01-29 DIAGNOSIS — E1122 Type 2 diabetes mellitus with diabetic chronic kidney disease: Secondary | ICD-10-CM | POA: Diagnosis not present

## 2022-01-29 DIAGNOSIS — I480 Paroxysmal atrial fibrillation: Secondary | ICD-10-CM | POA: Diagnosis not present

## 2022-01-29 DIAGNOSIS — N184 Chronic kidney disease, stage 4 (severe): Secondary | ICD-10-CM | POA: Diagnosis not present

## 2022-02-28 ENCOUNTER — Encounter: Payer: Self-pay | Admitting: Gastroenterology

## 2022-02-28 ENCOUNTER — Encounter: Payer: Self-pay | Admitting: Nurse Practitioner

## 2022-02-28 ENCOUNTER — Ambulatory Visit: Payer: Medicare Other | Admitting: Nurse Practitioner

## 2022-02-28 VITALS — BP 132/64 | HR 56 | Ht 73.5 in | Wt 216.0 lb

## 2022-02-28 DIAGNOSIS — Z1211 Encounter for screening for malignant neoplasm of colon: Secondary | ICD-10-CM | POA: Diagnosis not present

## 2022-02-28 DIAGNOSIS — N189 Chronic kidney disease, unspecified: Secondary | ICD-10-CM

## 2022-02-28 DIAGNOSIS — D649 Anemia, unspecified: Secondary | ICD-10-CM | POA: Diagnosis not present

## 2022-02-28 MED ORDER — PLENVU 140 G PO SOLR: 140.0000 g | ORAL | 0 refills | Status: DC

## 2022-02-28 NOTE — Progress Notes (Signed)
02/28/2022 Gary Lang. [id number] 1951/01/11   Chief Complaint: Schedule a colonoscopy  History of Present Illness:  Gary Lang is a 71 year old male with a past medical history of anxiety hypertension, CHF, paroxysmal atrial fibrillation 09/2017 not on anticoagulation, DM II, rheumatoid arthritis, CKD stage III - IV and GERD. S/P cholecystectomy 10/2017 and laparoscopic left inguinal hernia repair 10/2021.   He presents today as referred by Dr. Edwyna Ready hall to schedule a screening colonoscopy.  He reported undergoing a colonoscopy in his early 86s in [place] which was normal.  He underwent a second colonoscopy at [facility] in [place] at least 10 years ago which she reported was normal, no polyps.  He denies having any abdominal pain but stated his stomach growls at times.  He is passing normal formed brown bowel movement daily.  No rectal bleeding or black stools.   He has a history of dysphagia with associated weight loss and underwent an extensive GI evaluation by Dr. Ardis Hughs in 2022 as follows:  CT scan without IV contrast 05/2021 "no acute findings in the abdomen or pelvis..  Left inguinal hernia containing fat".  EGD 06/2021 slightly irregular GE junction biopsied extensively, mild nonspecific gastritis biopsied.  Negative for H. pylori, negative for neoplastic signs.  Endoscopic ultrasound 06/2021 thickened distal esophagus, soft proximal gastric small nodule.  Multiple samples taken including FNA of the wall of his esophagus.  None of the samples showed any sign of cancer, neoplasm.  CT scan chest 06/02/2021 "esophagus is poorly evaluated due to internal debris/fluid".  PET scan 08/2021 "no substantial degree of abnormal esophageal activity.  Thoracic esophageal wall thickening in the mid to distal esophagus possibly from esophagitis."  Twice daily proton pump inhibitor and a tincture of time led to complete resolution of his dysphagia and his weight loss improved by end  of March 2023.  He was last seen in office by Dr. Ardis Hughs on 11/28/2021. At that time, his dysphagia abated and he as instructed to continue PPI bid and to follow up in 6 months. His prior weight loss was likely due to poorly controlled diabetes.   He denies having any recurrent dysphagia.  No heartburn or upper abdominal pain.  He has gained 11 pounds over the past 4 months.  He has a history of CKD stage IV.  Secondary to diabetes and in 2014 he stated he was on Metformin and Orencia which resulted in acute kidney failure.  He underwent fistula placement to his left arm and he was on hemodialysis for about 6 months until his renal status improved. Since then, he is followed by nephrologist Dr.Bhandari at [facility].      Latest Ref Rng & Units 09/25/2021   10:17 AM 08/06/2021    1:20 AM 07/11/2021   10:37 AM  CBC  WBC 4.0 - 10.5 K/uL 9.0  9.0  7.9   Hemoglobin 13.0 - 17.0 g/dL 11.6  12.3  11.8   Hematocrit 39.0 - 52.0 % 36.1  37.3  36.7   Platelets 150 - 400 K/uL 196  179  219.0        Latest Ref Rng & Units 09/25/2021   10:17 AM 08/06/2021    1:20 AM 07/11/2021   10:37 AM  CMP  Glucose 70 - 99 mg/dL 118  104  104   BUN 8 - 23 mg/dL 28  22  18    Creatinine 0.61 - 1.24 mg/dL 2.20  2.00  1.82  Sodium 135 - 145 mmol/L 140  138  141   Potassium 3.5 - 5.1 mmol/L 4.2  3.7  3.7   Chloride 98 - 111 mmol/L 107  102  106   CO2 22 - 32 mmol/L 27  27  28    Calcium 8.9 - 10.3 mg/dL 8.8  8.6  8.4   Total Protein 6.5 - 8.1 g/dL  6.8  7.0   Total Bilirubin 0.3 - 1.2 mg/dL  0.6  0.5   Alkaline Phos 38 - 126 U/L  61  55   AST 15 - 41 U/L  11  14   ALT 0 - 44 U/L  14  11      EGD 06/06/2021: - Slightly irregular GE junction, biopsied extensively. - Mild, non-specific gastritis. Biopsied. - The examination was otherwise normal. 1. Stomach, biopsy, antrum and body - ANTRAL MUCOSA WITH SLIGHT CHRONIC INFLAMMATION. [place] NEGATIVE FOR HELICOBACTER PYLORI - NO INTESTINAL METAPLASIA,  DYSPLASIA OR CARCINOMA. 2. Esophagogastric junction, biopsy - GASTROESOPHAGEAL MUCOSA WITH INFLAMMATION CONSISTENT WITH REFLUX. - NO INTESTINAL METAPLASIA, DYSPLASIA OR CARCINOMA  EGD/EUS 06/28/2021: -Thin nonobstructing Schatzki's ring at the GE junction. -Soft 1.5 cm proximal gastric nodule just below the GE junction was sampled with biopsy. -The distal third of the esophagus was nonspecifically, circumferentially thickened by EUS. The wall of the esophagus in this region was sampled with a single pass of an FNA needle. Sampling the wall of the esophagus is high risk and I elected to only perform it a single time. The aspirate was admittedly quite scant. -Cluster of shotty appearing lymph nodes adjacent to the distal esophagus were sampled with a single pass of EUS FNA needle as well. A. STOMACH, NODULE, BIOPSY:  - Esophageal squamous and cardiac mucosa with nonspecific hyperplastic  changes  - Warthin-Starry stain is negative for Helicobacter pylori  - Negative for intestinal metaplasia or dysplasia   Past Medical History:  Diagnosis Date   CHF (congestive heart failure) ([facility])    Chronic cholecystitis    Chronic kidney disease    Diabetes mellitus without complication ([facility])    Type II   Hypertension    Rheumatoid arthritis ([place])    Vitamin D deficiency    Hx: of   Past Surgical History:  Procedure Laterality Date   AV FISTULA PLACEMENT Left 06/28/2013   Procedure: ARTERIOVENOUS (AV) FISTULA CREATION- Left arm with ultrasound guidance;  Surgeon: Rosetta Posner, MD;  Location: [place];  Service: Vascular;  Laterality: Left;   BIOPSY  06/28/2021   Procedure: BIOPSY;  Surgeon: Milus Banister, MD;  Location: [facility];  Service: Endoscopy;;   CHOLECYSTECTOMY N/A 10/28/2017   Procedure: LAPAROSCOPIC CHOLECYSTECTOMY WITH INTRAOPERATIVE CHOLANGIOGRAM;  Surgeon: Donnie Mesa, MD;  Location: [place];  Service: General;  Laterality: N/A;   COLONOSCOPY     hx: of    ESOPHAGOGASTRODUODENOSCOPY (EGD) WITH PROPOFOL N/A 06/28/2021   Procedure: ESOPHAGOGASTRODUODENOSCOPY (EGD) WITH PROPOFOL;  Surgeon: Milus Banister, MD;  [facility];  Service: Endoscopy;  Laterality: N/A;   EUS N/A 06/28/2021   Procedure: UPPER ENDOSCOPIC ULTRASOUND (EUS) LINEAR;  Surgeon: Milus Banister, MD;  [facility];  Service: Endoscopy;  Laterality: N/A;   FINE NEEDLE ASPIRATION N/A 06/28/2021   Procedure: FINE NEEDLE ASPIRATION (FNA) LINEAR;  Surgeon: Milus Banister, MD;  [facility];  Service: Endoscopy;  Laterality: N/A;   HERNIA REPAIR     INSERTION OF DIALYSIS CATHETER N/A 06/28/2013   Procedure: INSERTION OF DIALYSIS CATHETER;  Surgeon: Rosetta Posner, MD;  Location: [place];  Service: Vascular;  Laterality: N/A;   IR GENERIC HISTORICAL  09/08/2016   IR PERC CHOLECYSTOSTOMY 09/08/2016 [name], MD [facility]   IR GENERIC HISTORICAL  10/15/2016   IR RADIOLOGIST EVAL & MGMT 10/15/2016 Ardis Rowan, PA-C [facility]   IR GENERIC HISTORICAL  10/29/2016   IR RADIOLOGIST EVAL & MGMT 10/29/2016 Jacqulynn Cadet, MD [facility]   LIGATION OF COMPETING BRANCHES OF ARTERIOVENOUS FISTULA Left 10/12/2013   Procedure: LIGATION OF COMPETING BRANCHES OF ARTERIOVENOUS FISTULA;  Surgeon: Elam Dutch, MD;  [facility];  Service: Vascular;  Laterality: Left;   neck surgery       Current Outpatient Medications on File Prior to Visit  Medication Sig Dispense Refill   acetaminophen (TYLENOL) 500 MG tablet Take 500-1,000 mg by mouth daily as needed for moderate pain.     atorvastatin (LIPITOR) 40 MG tablet Take 40 mg by mouth in the morning.     Cholecalciferol (VITAMIN D3) 50 MCG (2000 UT) TABS Take 2,000 Units by mouth every other day.     CINNAMON PO Take 1 tablet by mouth daily.     cloNIDine (CATAPRES) 0.2 MG tablet Take 0.2 mg by mouth 3 (three) times daily.     diltiazem (CARDIZEM CD) 180 MG 24 hr capsule Take 180 mg by mouth 2  (two) times daily.     HYDROcodone-acetaminophen (NORCO/VICODIN) 5-325 MG tablet Take 1 tablet by mouth every 6 (six) hours as needed for moderate pain. 15 tablet 0   insulin glargine (LANTUS) 100 UNIT/ML injection Inject 30 Units into the skin in the morning.     insulin lispro (HUMALOG) 100 UNIT/ML cartridge Inject 10 Units into the skin 3 (three) times daily with meals.     Insulin Pen Needle (PEN NEEDLES) 31G X 6 MM MISC Use 1 needle per injection as directed 100 each 1   lisinopril (ZESTRIL) 40 MG tablet Take 40 mg by mouth daily.     LORazepam (ATIVAN) 0.5 MG tablet Take 0.5 mg by mouth daily as needed for sleep.     predniSONE (DELTASONE) 10 MG tablet Take 5 mg by mouth daily as needed (pain).     sucralfate (CARAFATE) 1 g tablet Take 1 tablet (1 g total) by mouth 4 (four) times daily -  with meals and at bedtime. (Patient taking differently: Take 1 g by mouth as needed. Take 1 tablet (1 g) up to 4 times daily) 90 tablet 0   triamcinolone cream (KENALOG) 0.1 % Apply 1 application topically 2 (two) times daily as needed (itching/irritation).     TRULICITY 1.5 KZ/9.9JT SOPN Inject 1.5 mg into the skin every Wednesday.     UNIFINE PENTIPS 31G X 5 MM MISC USE AS DIRECTED WITH INSULIN 100 each PRN   pantoprazole (PROTONIX) 40 MG tablet Take 1 tablet (40 mg total) by mouth 2 (two) times daily. (Patient taking differently: Take 20 mg by mouth 2 (two) times daily.) 60 tablet 2   No current facility-administered medications on file prior to visit.   Allergies  Allergen Reactions   Glyburide     Renal issues   Metformin And Related Other (See Comments)    Renal issues.   Methotrexate Derivatives Other (See Comments)    Renal issues   Orencia [Abatacept] Other (See Comments)    Renal issues    Current Medications, Allergies, Past Medical History, Past Surgical History, Family History and Social History  were reviewed in [facility] record.  Review of Systems:    Constitutional: Negative for fever, sweats, chills or weight loss.  Respiratory: Negative for shortness of breath.   Cardiovascular: Negative for chest pain, palpitations and leg swelling.  Gastrointestinal: See HPI.  Musculoskeletal: Negative for back pain or muscle aches.  Neurological: Negative for dizziness, headaches or paresthesias.   Physical Exam: BP 132/64   Pulse (!) 56   Ht 6' 1.5" (1.867 m)   Wt 216 lb (98 kg)   SpO2 99%   BMI 28.11 kg/m  Wt Readings from Last 3 Encounters:  02/28/22 216 lb (98 kg)  11/28/21 213 lb (96.6 kg)  10/05/21 205 lb (93 kg)    General: 71 year old male in no acute distress. Head: Normocephalic and atraumatic. Eyes: No scleral icterus. Conjunctiva pink . Ears: Normal auditory acuity. Mouth: Upper dentures intact.  No ulcers or lesions.  Lungs: Clear throughout to auscultation. Heart: Regular rate and rhythm, no murmur. Abdomen: Soft, nontender and nondistended. No masses or hepatomegaly. Normal bowel sounds x 4 quadrants.  Rectal: Deferred. Musculoskeletal: Symmetrical with no gross deformities. Extremities: No edema.  Left upper extremity with AV fistula with positive bruit and thrill. Neurological: Alert oriented x 4. No focal deficits.  Psychological: Alert and cooperative. Normal mood and affect  Assessment and Recommendations:  60) 71 year old male presents to schedule screening colonoscopy.  Patient reported completing 2 colonoscopies in his lifetime which were normal, no polyps.  Last colonoscopy was 10+ years ago. -Colonoscopy benefits and risks discussed including risk with sedation, risk of bleeding, perforation and infection  -Further recommendations to be determined after colonoscopy completed  2) History of GERD, esophagitis and dysphagia. EGD 10/22 showed a slight irregularity at the GE junction, biopsies were consistent with reflux without evidence of intestinal metaplasia or carcinoma. 1.5 cm gastric nodule below the GE  junction s/p EUS 10/22, biopsies consistent with esophageal squamous and cardiac mucosa with nonspecific hyperplastic changes without evidence of intestinal metaplasia/dysplasia or malignancy. -Continue Continue PPI po bid  3) Anemia of chronic disease, CKD.  No overt GI bleeding. -Continue follow-up with nephrologist  4) CKD stage III - IV  5) History of paroxysmal atrial fibrillation associated with pneumonia 09/2017. Not on anticoagulation.

## 2022-02-28 NOTE — Patient Instructions (Addendum)
If you are age 73 or older, your body mass index should be between 23-30. Your Body mass index is 28.11 kg/m. If this is out of the aforementioned range listed, please consider follow up with your Primary Care Provider.  If you are age 71 or younger, your body mass index should be between 19-25. Your Body mass index is 28.11 kg/m. If this is out of the aformentioned range listed, please consider follow up with your Primary Care Provider.   ________________________________________________________  The [facility] providers would like to encourage you to use [facility] to communicate with providers for non-urgent requests or questions.  Due to long hold times on the telephone, sending your provider a message by [facility] may be a faster and more efficient way to get a response.  Please allow 48 business hours for a response.  Please remember that this is for non-urgent requests.  _______________________________________________________  Dennis Bast have been scheduled for a colonoscopy. Please follow written instructions given to you at your visit today.  Please pick up your prep supplies at the pharmacy within the next 1-3 days. If you use inhalers (even only as needed), please bring them with you on the day of your procedure.  Your provider has requested that you go to the basement level for lab work before leaving today. Press "B" on the elevator. The lab is located at the first door on the left as you exit the elevator.  Due to recent changes in healthcare laws, you may see the results of your imaging and laboratory studies on MyChart before your provider has had a chance to review them.  We understand that in some cases there may be results that are confusing or concerning to you. Not all laboratory results come back in the same time frame and the provider may be waiting for multiple results in order to interpret others.  Please give [place] 48 hours in order for your provider to thoroughly review all the results  before contacting the office for clarification of your results.   1)  FURTHER RECOMMENDATIONS TO BE DETERMINED AFTER COLONOSCOPY COMPLETED   It was a pleasure to see you today!  Thank you for trusting me with your gastrointestinal care!

## 2022-03-03 NOTE — Progress Notes (Signed)
I agree with the above note, plan 

## 2022-03-04 ENCOUNTER — Ambulatory Visit (AMBULATORY_SURGERY_CENTER): Payer: Medicare Other | Admitting: Gastroenterology

## 2022-03-04 ENCOUNTER — Encounter: Payer: Self-pay | Admitting: Gastroenterology

## 2022-03-04 VITALS — BP 167/68 | HR 72 | Temp 98.0°F | Resp 15 | Ht 73.0 in | Wt 216.0 lb

## 2022-03-04 DIAGNOSIS — D122 Benign neoplasm of ascending colon: Secondary | ICD-10-CM

## 2022-03-04 DIAGNOSIS — D125 Benign neoplasm of sigmoid colon: Secondary | ICD-10-CM | POA: Diagnosis not present

## 2022-03-04 DIAGNOSIS — Z1211 Encounter for screening for malignant neoplasm of colon: Secondary | ICD-10-CM | POA: Diagnosis not present

## 2022-03-04 MED ORDER — SODIUM CHLORIDE 0.9 % IV SOLN: 500.0000 mL | INTRAVENOUS | Status: DC

## 2022-03-04 NOTE — Op Note (Signed)
[place] Patient Name: Gary Lang Procedure Date: 03/04/2022 10:00 AM MRN: [id number] Endoscopist: Milus Banister , MD Age: 71 Referring MD:  Date of Birth: 11/13/1950 Gender: Male Account #: [id number] Procedure:                Colonoscopy Indications:              Screening for colorectal malignant neoplasm Medicines:                Monitored Anesthesia Care Procedure:                Pre-Anesthesia Assessment:                           - Prior to the procedure, a History and Physical                            was performed, and patient medications and                            allergies were reviewed. The patient's tolerance of                            previous anesthesia was also reviewed. The risks                            and benefits of the procedure and the sedation                            options and risks were discussed with the patient.                            All questions were answered, and informed consent                            was obtained. Prior Anticoagulants: The patient has                            taken no previous anticoagulant or antiplatelet                            agents. ASA Grade Assessment: II - A patient with                            mild systemic disease. After reviewing the risks                            and benefits, the patient was deemed in                            satisfactory condition to undergo the procedure.                           After obtaining informed consent, the colonoscope  was passed under direct vision. Throughout the                            procedure, the patient's blood pressure, pulse, and                            oxygen saturations were monitored continuously. The                            CF [id number] #[id number] was introduced through the anus                            and advanced to the the cecum, identified by                            appendiceal orifice  and ileocecal valve. The                            colonoscopy was performed without difficulty. The                            patient tolerated the procedure well. The quality                            of the bowel preparation was good. The ileocecal                            valve, appendiceal orifice, and rectum were                            photographed. Scope In: 10:17:36 AM Scope Out: 10:32:28 AM Scope Withdrawal Time: 0 hours 11 minutes 9 seconds  Total Procedure Duration: 0 hours 14 minutes 52 seconds  Findings:                 Six sessile polyps were found in the sigmoid colon                            and ascending colon. The polyps were 3 to 6 mm in                            size. These polyps were removed with a cold snare.                            Resection and retrieval were complete.                           Multiple small and large-mouthed diverticula were                            found in the left colon.                           External and internal hemorrhoids were found. The  hemorrhoids were small.                           The exam was otherwise without abnormality on                            direct and retroflexion views. Complications:            No immediate complications. Estimated blood loss:                            None. Estimated Blood Loss:     Estimated blood loss: none. Impression:               - Six 3 to 6 mm polyps in the sigmoid colon and in                            the ascending colon, removed with a cold snare.                            Resected and retrieved.                           - Diverticulosis in the left colon.                           - External and internal hemorrhoids.                           - The examination was otherwise normal on direct                            and retroflexion views. Recommendation:           - Patient has a contact number available for                             emergencies. The signs and symptoms of potential                            delayed complications were discussed with the                            patient. Return to normal activities tomorrow.                            Written discharge instructions were provided to the                            patient.                           - Resume previous diet.                           - Continue present medications.                           -  Await pathology results. Milus Banister, MD 03/04/2022 10:35:19 AM This report has been signed electronically.

## 2022-03-04 NOTE — Patient Instructions (Signed)
Discharge instructions given. Handouts o9n polyps,diverticulosis and hemorrhoids. Resume previous medications. YOU HAD AN ENDOSCOPIC PROCEDURE TODAY AT [facility]:   Refer to the procedure report that was given to you for any specific questions about what was found during the examination.  If the procedure report does not answer your questions, please call your gastroenterologist to clarify.  If you requested that your care partner not be given the details of your procedure findings, then the procedure report has been included in a sealed envelope for you to review at your convenience later.  YOU SHOULD EXPECT: Some feelings of bloating in the abdomen. Passage of more gas than usual.  Walking can help get rid of the air that was put into your GI tract during the procedure and reduce the bloating. If you had a lower endoscopy (such as a colonoscopy or flexible sigmoidoscopy) you may notice spotting of blood in your stool or on the toilet paper. If you underwent a bowel prep for your procedure, you may not have a normal bowel movement for a few days.  Please Note:  You might notice some irritation and congestion in your nose or some drainage.  This is from the oxygen used during your procedure.  There is no need for concern and it should clear up in a day or so.  SYMPTOMS TO REPORT IMMEDIATELY:  Following lower endoscopy (colonoscopy or flexible sigmoidoscopy):  Excessive amounts of blood in the stool  Significant tenderness or worsening of abdominal pains  Swelling of the abdomen that is new, acute  Fever of 100F or higher   For urgent or emergent issues, a gastroenterologist can be reached at any hour by calling [phone]. Do not use MyChart messaging for urgent concerns.    DIET:  We do recommend a small meal at first, but then you may proceed to your regular diet.  Drink plenty of fluids but you should avoid alcoholic beverages for 24 hours.  ACTIVITY:  You should  plan to take it easy for the rest of today and you should NOT DRIVE or use heavy machinery until tomorrow (because of the sedation medicines used during the test).    FOLLOW UP: Our staff will call the number listed on your records the next business day following your procedure.  We will call around 7:15- 8:00 am to check on you and address any questions or concerns that you may have regarding the information given to you following your procedure. If we do not reach you, we will leave a message.  If you develop any symptoms (ie: fever, flu-like symptoms, shortness of breath, cough etc.) before then, please call [phone].  If you test positive for Covid 19 in the 2 weeks post procedure, please call and report this information to [place].    If any biopsies were taken you will be contacted by phone or by letter within the next 1-3 weeks.  Please call us at [phone] if you have not heard about the biopsies in 3 weeks.    SIGNATURES/CONFIDENTIALITY: You and/or your care partner have signed paperwork which will be entered into your electronic medical record.  These signatures attest to the fact that that the information above on your After Visit Summary has been reviewed and is understood.  Full responsibility of the confidentiality of this discharge information lies with you and/or your care-partner.

## 2022-03-04 NOTE — Progress Notes (Signed)
Report to pacu rn; vss. Care resumed by rn.

## 2022-03-04 NOTE — Progress Notes (Signed)
Called to room to assist during endoscopic procedure.  Patient ID and intended procedure confirmed with present staff. Received instructions for my participation in the procedure from the performing physician.  

## 2022-03-04 NOTE — Progress Notes (Signed)
  The recent H&P (dated last week) was reviewed, the patient was examined and there is no change in the patients condition since that H&P was completed.   Milus Banister  03/04/2022, 9:59 AM

## 2022-03-06 ENCOUNTER — Telehealth: Payer: Self-pay | Admitting: *Deleted

## 2022-03-06 NOTE — Telephone Encounter (Signed)
  Follow up Call-     03/04/2022    9:53 AM 06/06/2021    7:10 AM  Call back number  Post procedure Call Back phone  # [phone]  Permission to leave phone message Yes Yes     Patient questions:  Do you have a fever, pain , or abdominal swelling? No. Pain Score  0 *  Have you tolerated food without any problems? Yes.    Have you been able to return to your normal activities? Yes.    Do you have any questions about your discharge instructions: Diet   No. Medications  No. Follow up visit  No.  Do you have questions or concerns about your Care? No.  Actions: * If pain score is 4 or above: No action needed, pain <4.

## 2022-03-08 ENCOUNTER — Other Ambulatory Visit (HOSPITAL_COMMUNITY): Payer: Self-pay

## 2022-03-11 ENCOUNTER — Encounter: Payer: Self-pay | Admitting: Gastroenterology

## 2022-03-14 DIAGNOSIS — R002 Palpitations: Secondary | ICD-10-CM | POA: Insufficient documentation

## 2022-03-14 NOTE — Progress Notes (Signed)
Cardiology Office Note   Date:  03/15/2022   ID:  Gary Noe Sr., DOB Jun 13, 1951, MRN [id number]  PCP:  Celene Squibb, MD  Cardiologist:   Minus Breeding, MD   Chief Complaint  Patient presents with   Atrial Fibrillation       History of Present Illness: Gary L Robb Sr. is a 71 y.o. male who presents for follow up of atrial fib.   There was a question of this after pneumonia.  He was referred back because of palpitations.  A cardiac monitor to evaluate his palpitations and for recurrence of atrial fibrillation was completed in December and demonstrated no evidence of atrial fibrillation.  He had short runs of NSVT.  The patient presents for follow-up.  He has had no new arrhythmias.  He denies any chest pressure, neck or arm discomfort.  He had no new shortness of breath, PND or orthopnea.  He had no palpitations, presyncope or syncope.  He does some yard work including weed eating.  He has no limitations with this.    Past Medical History:  Diagnosis Date   CHF (congestive heart failure) (HCC)    Chronic cholecystitis    Chronic kidney disease    Diabetes mellitus without complication (HCC)    Type II   Hypertension    Rheumatoid arthritis ([place])    Vitamin D deficiency    Hx: of    Past Surgical History:  Procedure Laterality Date   AV FISTULA PLACEMENT Left 06/28/2013   Procedure: ARTERIOVENOUS (AV) FISTULA CREATION- Left arm with ultrasound guidance;  Surgeon: Rosetta Posner, MD;  Location: [facility];  Service: Vascular;  Laterality: Left;   BIOPSY  06/28/2021   Procedure: BIOPSY;  Surgeon: Milus Banister, MD;  Location: [facility];  Service: Endoscopy;;   CHOLECYSTECTOMY N/A 10/28/2017   Procedure: LAPAROSCOPIC CHOLECYSTECTOMY WITH INTRAOPERATIVE CHOLANGIOGRAM;  Surgeon: Donnie Mesa, MD;  Location: [facility];  Service: General;  Laterality: N/A;   COLONOSCOPY     hx: of   ESOPHAGOGASTRODUODENOSCOPY (EGD) WITH PROPOFOL N/A 06/28/2021   Procedure:  ESOPHAGOGASTRODUODENOSCOPY (EGD) WITH PROPOFOL;  Surgeon: Milus Banister, MD;  Location: [facility];  Service: Endoscopy;  Laterality: N/A;   EUS N/A 06/28/2021   Procedure: UPPER ENDOSCOPIC ULTRASOUND (EUS) LINEAR;  Surgeon: Milus Banister, MD;  Location: [facility];  Service: Endoscopy;  Laterality: N/A;   FINE NEEDLE ASPIRATION N/A 06/28/2021   Procedure: FINE NEEDLE ASPIRATION (FNA) LINEAR;  Surgeon: Milus Banister, MD;  Location: [facility];  Service: Endoscopy;  Laterality: N/A;   HERNIA REPAIR     INSERTION OF DIALYSIS CATHETER N/A 06/28/2013   Procedure: INSERTION OF DIALYSIS CATHETER;  Surgeon: Rosetta Posner, MD;  Location: [facility];  Service: Vascular;  Laterality: N/A;   IR GENERIC HISTORICAL  09/08/2016   IR PERC CHOLECYSTOSTOMY 09/08/2016 [name], MD [facility]   IR GENERIC HISTORICAL  10/15/2016   IR RADIOLOGIST EVAL & MGMT 10/15/2016 Ardis Rowan, PA-C [facility]   IR GENERIC HISTORICAL  10/29/2016   IR RADIOLOGIST EVAL & MGMT 10/29/2016 Jacqulynn Cadet, MD [facility]   LIGATION OF COMPETING BRANCHES OF ARTERIOVENOUS FISTULA Left 10/12/2013   Procedure: LIGATION OF COMPETING BRANCHES OF ARTERIOVENOUS FISTULA;  Surgeon: Elam Dutch, MD;  [facility];  Service: Vascular;  Laterality: Left;   neck surgery       Current Outpatient Medications  Medication Sig Dispense Refill   acetaminophen (TYLENOL) 500 MG tablet  Take 500-1,000 mg by mouth daily as needed for moderate pain.     atorvastatin (LIPITOR) 40 MG tablet Take 40 mg by mouth in the morning.     Cholecalciferol (VITAMIN D3) 50 MCG (2000 UT) TABS Take 2,000 Units by mouth every other day.     CINNAMON PO Take 1 tablet by mouth daily.     cloNIDine (CATAPRES) 0.2 MG tablet Take 0.2 mg by mouth 3 (three) times daily.     diltiazem (CARDIZEM CD) 180 MG 24 hr capsule Take 180 mg by mouth 2 (two) times daily.     hydrOXYzine (ATARAX) 25 MG tablet Take 25 mg by mouth at bedtime as needed.      insulin glargine (LANTUS) 100 UNIT/ML injection Inject 30 Units into the skin in the morning.     insulin lispro (HUMALOG) 100 UNIT/ML cartridge Inject 10 Units into the skin 3 (three) times daily with meals.     Insulin Pen Needle (PEN NEEDLES) 31G X 6 MM MISC Use 1 needle per injection as directed 100 each 1   lisinopril (ZESTRIL) 40 MG tablet Take 40 mg by mouth daily.     LORazepam (ATIVAN) 0.5 MG tablet Take 0.5 mg by mouth daily as needed for sleep.     pantoprazole (PROTONIX) 40 MG tablet Take 1 tablet (40 mg total) by mouth 2 (two) times daily. (Patient taking differently: Take 20 mg by mouth 2 (two) times daily.) 60 tablet 2   predniSONE (DELTASONE) 10 MG tablet Take 5 mg by mouth daily as needed (pain).     sucralfate (CARAFATE) 1 g tablet Take 1 tablet (1 g total) by mouth 4 (four) times daily -  with meals and at bedtime. (Patient taking differently: Take 1 g by mouth as needed. Take 1 tablet (1 g) up to 4 times daily) 90 tablet 0   TRULICITY 1.5 GY/6.9SW SOPN Inject 1.5 mg into the skin every Wednesday.     UNIFINE PENTIPS 31G X 5 MM MISC USE AS DIRECTED WITH INSULIN 100 each PRN   No current facility-administered medications for this visit.    Allergies:   Glyburide, Metformin and related, Methotrexate derivatives, and Orencia [abatacept]    ROS:  Please see the history of present illness.   Otherwise, review of systems are positive for none.   All other systems are reviewed and negative.    PHYSICAL EXAM: VS:  BP (!) 142/58   Pulse 63   Ht 6\' 1"  (1.854 m)   Wt 220 lb 3.2 oz (99.9 kg)   SpO2 97%   BMI 29.05 kg/m  , BMI Body mass index is 29.05 kg/m. GENERAL:  Well appearing NECK:  No jugular venous distention, waveform within normal limits, carotid upstroke brisk and symmetric, no bruits, no thyromegaly LUNGS:  Clear to auscultation bilaterally CHEST:  Unremarkable HEART:  PMI not displaced or sustained,S1 and S2 within normal limits, no S3, no S4, no clicks, no  rubs, no murmurs ABD:  Flat, positive bowel sounds normal in frequency in pitch, no bruits, no rebound, no guarding, no midline pulsatile mass, no hepatomegaly, no splenomegaly EXT:  2 plus pulses throughout, no edema, no cyanosis no clubbing, left arm AV fistula with positive thrill and bruit   EKG:  EKG is not ordered today.   Recent Labs: 06/25/2021: TSH 2.370 08/06/2021: ALT 14 09/25/2021: BUN 28; Creatinine, Ser 2.20; Hemoglobin 11.6; Platelets 196; Potassium 4.2; Sodium 140    Lipid Panel    Component Value Date/Time  CHOL 325 (H) 06/19/2013 0538   TRIG 145 06/19/2013 0538   HDL 46 06/19/2013 0538   CHOLHDL 7.1 06/19/2013 0538   VLDL 29 06/19/2013 0538   LDLCALC 250 (H) 06/19/2013 0538      Wt Readings from Last 3 Encounters:  03/15/22 220 lb 3.2 oz (99.9 kg)  03/04/22 216 lb (98 kg)  02/28/22 216 lb (98 kg)      Other studies Reviewed: Additional studies/ records that were reviewed today include: Labs Review of the above records demonstrates:  Please see elsewhere in the note.     ASSESSMENT AND PLAN:  PALPITATIONS:   He had no further arrhythmias.  No change in therapy.  HTN:  The blood pressure is controlled on the meds as listed.  No change in therapy.   CKD III:   His creatinine is followed closely by nephrology.  DM: His A1c is 7.9.  No change in therapy.     Current medicines are reviewed at length with the patient today.  The patient does not have concerns regarding medicines.  The following changes have been made:  None  Labs/ tests ordered today include: None  No orders of the defined types were placed in this encounter.     Disposition:   FU with me in 12 months.    Signed, Minus Breeding, MD  03/15/2022 1:01 PM    [place]

## 2022-03-15 ENCOUNTER — Encounter: Payer: Self-pay | Admitting: Cardiology

## 2022-03-15 ENCOUNTER — Ambulatory Visit: Payer: Medicare Other | Admitting: Cardiology

## 2022-03-15 VITALS — BP 142/58 | HR 63 | Ht 73.0 in | Wt 220.2 lb

## 2022-03-15 DIAGNOSIS — E118 Type 2 diabetes mellitus with unspecified complications: Secondary | ICD-10-CM | POA: Diagnosis not present

## 2022-03-15 DIAGNOSIS — I10 Essential (primary) hypertension: Secondary | ICD-10-CM

## 2022-03-15 DIAGNOSIS — N1831 Chronic kidney disease, stage 3a: Secondary | ICD-10-CM

## 2022-03-15 DIAGNOSIS — R002 Palpitations: Secondary | ICD-10-CM | POA: Diagnosis not present

## 2022-03-15 NOTE — Patient Instructions (Signed)

## 2022-03-28 DIAGNOSIS — N189 Chronic kidney disease, unspecified: Secondary | ICD-10-CM | POA: Diagnosis not present

## 2022-03-28 DIAGNOSIS — D631 Anemia in chronic kidney disease: Secondary | ICD-10-CM | POA: Diagnosis not present

## 2022-03-28 DIAGNOSIS — N051 Unspecified nephritic syndrome with focal and segmental glomerular lesions: Secondary | ICD-10-CM | POA: Diagnosis not present

## 2022-03-28 DIAGNOSIS — N2581 Secondary hyperparathyroidism of renal origin: Secondary | ICD-10-CM | POA: Diagnosis not present

## 2022-03-28 DIAGNOSIS — N1832 Chronic kidney disease, stage 3b: Secondary | ICD-10-CM | POA: Diagnosis not present

## 2022-03-28 DIAGNOSIS — I129 Hypertensive chronic kidney disease with stage 1 through stage 4 chronic kidney disease, or unspecified chronic kidney disease: Secondary | ICD-10-CM | POA: Diagnosis not present

## 2022-03-28 DIAGNOSIS — E872 Acidosis, unspecified: Secondary | ICD-10-CM | POA: Diagnosis not present

## 2022-05-02 DIAGNOSIS — E782 Mixed hyperlipidemia: Secondary | ICD-10-CM | POA: Diagnosis not present

## 2022-05-02 DIAGNOSIS — E1122 Type 2 diabetes mellitus with diabetic chronic kidney disease: Secondary | ICD-10-CM | POA: Diagnosis not present

## 2022-05-08 DIAGNOSIS — I10 Essential (primary) hypertension: Secondary | ICD-10-CM | POA: Diagnosis not present

## 2022-05-08 DIAGNOSIS — D638 Anemia in other chronic diseases classified elsewhere: Secondary | ICD-10-CM | POA: Diagnosis not present

## 2022-05-08 DIAGNOSIS — D72829 Elevated white blood cell count, unspecified: Secondary | ICD-10-CM | POA: Diagnosis not present

## 2022-05-08 DIAGNOSIS — E782 Mixed hyperlipidemia: Secondary | ICD-10-CM | POA: Diagnosis not present

## 2022-05-08 DIAGNOSIS — E1122 Type 2 diabetes mellitus with diabetic chronic kidney disease: Secondary | ICD-10-CM | POA: Diagnosis not present

## 2022-05-08 DIAGNOSIS — N184 Chronic kidney disease, stage 4 (severe): Secondary | ICD-10-CM | POA: Diagnosis not present

## 2022-05-08 DIAGNOSIS — M069 Rheumatoid arthritis, unspecified: Secondary | ICD-10-CM | POA: Diagnosis not present

## 2022-05-08 DIAGNOSIS — Z23 Encounter for immunization: Secondary | ICD-10-CM | POA: Diagnosis not present

## 2022-05-08 DIAGNOSIS — R809 Proteinuria, unspecified: Secondary | ICD-10-CM | POA: Diagnosis not present

## 2022-05-08 DIAGNOSIS — I480 Paroxysmal atrial fibrillation: Secondary | ICD-10-CM | POA: Diagnosis not present

## 2022-05-14 ENCOUNTER — Ambulatory Visit: Payer: Medicare Other | Admitting: Gastroenterology

## 2022-06-03 DIAGNOSIS — E1140 Type 2 diabetes mellitus with diabetic neuropathy, unspecified: Secondary | ICD-10-CM | POA: Diagnosis not present

## 2022-06-03 DIAGNOSIS — M79672 Pain in left foot: Secondary | ICD-10-CM | POA: Diagnosis not present

## 2022-06-03 DIAGNOSIS — I739 Peripheral vascular disease, unspecified: Secondary | ICD-10-CM | POA: Diagnosis not present

## 2022-06-03 DIAGNOSIS — L609 Nail disorder, unspecified: Secondary | ICD-10-CM | POA: Diagnosis not present

## 2022-06-03 DIAGNOSIS — M79671 Pain in right foot: Secondary | ICD-10-CM | POA: Diagnosis not present

## 2022-06-03 DIAGNOSIS — M79675 Pain in left toe(s): Secondary | ICD-10-CM | POA: Diagnosis not present

## 2022-06-03 DIAGNOSIS — M79674 Pain in right toe(s): Secondary | ICD-10-CM | POA: Diagnosis not present

## 2022-06-12 DIAGNOSIS — Z23 Encounter for immunization: Secondary | ICD-10-CM | POA: Diagnosis not present

## 2022-07-09 ENCOUNTER — Ambulatory Visit (INDEPENDENT_AMBULATORY_CARE_PROVIDER_SITE_OTHER): Payer: Medicare Other | Admitting: Gastroenterology

## 2022-07-09 ENCOUNTER — Encounter: Payer: Self-pay | Admitting: Gastroenterology

## 2022-07-09 VITALS — BP 158/70 | HR 78 | Ht 73.0 in | Wt 227.0 lb

## 2022-07-09 DIAGNOSIS — Z87898 Personal history of other specified conditions: Secondary | ICD-10-CM | POA: Diagnosis not present

## 2022-07-09 DIAGNOSIS — K219 Gastro-esophageal reflux disease without esophagitis: Secondary | ICD-10-CM | POA: Diagnosis not present

## 2022-07-09 DIAGNOSIS — Z86010 Personal history of colonic polyps: Secondary | ICD-10-CM | POA: Diagnosis not present

## 2022-07-09 DIAGNOSIS — R1310 Dysphagia, unspecified: Secondary | ICD-10-CM

## 2022-07-09 NOTE — Patient Instructions (Signed)
_______________________________________________________  If you are age 72 or older, your body mass index should be between 23-30. Your Body mass index is 29.95 kg/m. If this is out of the aforementioned range listed, please consider follow up with your Primary Care Provider.  If you are age 26 or younger, your body mass index should be between 19-25. Your Body mass index is 29.95 kg/m. If this is out of the aformentioned range listed, please consider follow up with your Primary Care Provider.   ________________________________________________________  The [facility] providers would like to encourage you to use [facility] to communicate with providers for non-urgent requests or questions.  Due to long hold times on the telephone, sending your provider a message by [facility] may be a faster and more efficient way to get a response.  Please allow 48 business hours for a response.  Please remember that this is for non-urgent requests.  _______________________________________________________  Follow up 1 year. Sooner if needed.   Thank you for choosing me and [facility].  Dr. Rush Landmark

## 2022-07-09 NOTE — Progress Notes (Signed)
[facility] VISIT   Primary Care Provider Celene Squibb, MD [place] [address] [phone]  Patient Profile: Gary Abbe Ennis Sr. is a 71 y.o. male with a pmh significant for CHF, diabetes, chronic renal insufficiency, RA, status post cholecystectomy, diverticulosis, colon polyps (TA's).  The patient presents to the [facility] for an evaluation and management of problem(s) noted below:  Problem List 1. History of dysphagia   2. Gastroesophageal reflux disease without esophagitis   3. Hx of adenomatous colonic polyps     History of Present Illness Please see prior notes for full details of HPI.  Interval History This is a patient of Dr. Ardis Hughs who is being seen in follow-up.  He was last seen by Dr. Ardis Hughs this summer for a colonoscopy (results as below).  Patient states that he is doing well overall.  He denies any significant dysphagia symptoms which were previously an issue for him.  He still has looser bowel movements on a daily basis.  He wakes up around 3 in the morning will have his bowel movement and then over the course the day may have another 1 or 2 but otherwise is able to do all of his activities of daily living.  Is not clear that he has been on cholestyramine therapy in the past.  He has no other significant issues now that he takes his energy vitamin and is able to be active.   GI Review of Systems Positive as above Negative for dysphagia, odynophagia, nausea, vomiting, pain, melena, hematochezia  Review of Systems General: Denies fevers/chills/weight loss unintentionally Cardiovascular: Denies chest pain Pulmonary: Denies shortness of breath Gastroenterological: See HPI Genitourinary: Denies darkened urine Hematological: Denies easy bruising/bleeding Dermatological: Denies jaundice Psychological: Mood is stable   Medications Current Outpatient Medications  Medication Sig Dispense Refill    acetaminophen (TYLENOL) 500 MG tablet Take 500-1,000 mg by mouth daily as needed for moderate pain.     atorvastatin (LIPITOR) 40 MG tablet Take 40 mg by mouth in the morning.     Cholecalciferol (VITAMIN D3) 50 MCG (2000 UT) TABS Take 2,000 Units by mouth every other day.     CINNAMON PO Take 1 tablet by mouth daily.     cloNIDine (CATAPRES) 0.2 MG tablet Take 0.2 mg by mouth 3 (three) times daily.     diltiazem (CARDIZEM CD) 180 MG 24 hr capsule Take 180 mg by mouth 2 (two) times daily.     hydrOXYzine (ATARAX) 25 MG tablet Take 25 mg by mouth at bedtime as needed.     insulin glargine (LANTUS) 100 UNIT/ML injection Inject 30 Units into the skin in the morning.     insulin lispro (HUMALOG) 100 UNIT/ML cartridge Inject 10 Units into the skin 3 (three) times daily with meals.     Insulin Pen Needle (PEN NEEDLES) 31G X 6 MM MISC Use 1 needle per injection as directed 100 each 1   lisinopril (ZESTRIL) 40 MG tablet Take 40 mg by mouth daily.     LORazepam (ATIVAN) 0.5 MG tablet Take 0.5 mg by mouth daily as needed for sleep.     predniSONE (DELTASONE) 10 MG tablet Take 5 mg by mouth daily as needed (pain).     sucralfate (CARAFATE) 1 g tablet Take 1 tablet (1 g total) by mouth 4 (four) times daily -  with meals and at bedtime. (Patient taking differently: Take 1 g by mouth as needed. Take 1 tablet (1 g) up  to 4 times daily) 90 tablet 0   TRULICITY 1.5 DG/6.4QI SOPN Inject 1.5 mg into the skin every Wednesday.     UNIFINE PENTIPS 31G X 5 MM MISC USE AS DIRECTED WITH INSULIN 100 each PRN   pantoprazole (PROTONIX) 40 MG tablet Take 1 tablet (40 mg total) by mouth 2 (two) times daily. (Patient taking differently: Take 20 mg by mouth 2 (two) times daily.) 60 tablet 2   No current facility-administered medications for this visit.    Allergies Allergies  Allergen Reactions   Glyburide     Renal issues   Metformin And Related Other (See Comments)    Renal issues.   Methotrexate Derivatives Other  (See Comments)    Renal issues   Orencia [Abatacept] Other (See Comments)    Renal issues    Histories Past Medical History:  Diagnosis Date   CHF (congestive heart failure) ([place])    Chronic cholecystitis    Chronic kidney disease    Diabetes mellitus without complication ([place])    Type II   Hypertension    Rheumatoid arthritis ([place])    Vitamin D deficiency    Hx: of   Past Surgical History:  Procedure Laterality Date   AV FISTULA PLACEMENT Left 06/28/2013   Procedure: ARTERIOVENOUS (AV) FISTULA CREATION- Left arm with ultrasound guidance;  Surgeon: Rosetta Posner, MD;  Location: [place];  Service: Vascular;  Laterality: Left;   BIOPSY  06/28/2021   Procedure: BIOPSY;  Surgeon: Milus Banister, MD;  Location: [facility];  Service: Endoscopy;;   CHOLECYSTECTOMY N/A 10/28/2017   Procedure: LAPAROSCOPIC CHOLECYSTECTOMY WITH INTRAOPERATIVE CHOLANGIOGRAM;  Surgeon: Donnie Mesa, MD;  Location: [place];  Service: General;  Laterality: N/A;   COLONOSCOPY     hx: of   ESOPHAGOGASTRODUODENOSCOPY (EGD) WITH PROPOFOL N/A 06/28/2021   Procedure: ESOPHAGOGASTRODUODENOSCOPY (EGD) WITH PROPOFOL;  Surgeon: Milus Banister, MD;  Location: [facility];  Service: Endoscopy;  Laterality: N/A;   EUS N/A 06/28/2021   Procedure: UPPER ENDOSCOPIC ULTRASOUND (EUS) LINEAR;  Surgeon: Milus Banister, MD;  Location: [facility];  Service: Endoscopy;  Laterality: N/A;   FINE NEEDLE ASPIRATION N/A 06/28/2021   Procedure: FINE NEEDLE ASPIRATION (FNA) LINEAR;  Surgeon: Milus Banister, MD;  Location: [facility];  Service: Endoscopy;  Laterality: N/A;   HERNIA REPAIR     INSERTION OF DIALYSIS CATHETER N/A 06/28/2013   Procedure: INSERTION OF DIALYSIS CATHETER;  Surgeon: Rosetta Posner, MD;  Location: [place];  Service: Vascular;  Laterality: N/A;   IR GENERIC HISTORICAL  09/08/2016   IR PERC CHOLECYSTOSTOMY 09/08/2016 [name], MD [facility]   IR GENERIC HISTORICAL  10/15/2016   IR RADIOLOGIST EVAL & MGMT  10/15/2016 Ardis Rowan, PA-C [facility]   IR GENERIC HISTORICAL  10/29/2016   IR RADIOLOGIST EVAL & MGMT 10/29/2016 Jacqulynn Cadet, MD [facility]   LIGATION OF COMPETING BRANCHES OF ARTERIOVENOUS FISTULA Left 10/12/2013   Procedure: LIGATION OF COMPETING BRANCHES OF ARTERIOVENOUS FISTULA;  Surgeon: Elam Dutch, MD;  Location: [facility];  Service: Vascular;  Laterality: Left;   neck surgery     Social History   Socioeconomic History   Marital status: Married    Spouse name: Not on file   Number of children: Not on file   Years of education: Not on file   Highest education level: Not on file  Occupational History   Not on file  Tobacco Use   Smoking status: Former    Packs/day:  0.50    Years: 10.00    Total pack years: 5.00    Types: Cigarettes    Quit date: 09/02/1976    Years since quitting: 45.8   Smokeless tobacco: Never  Vaping Use   Vaping Use: Never used  Substance and Sexual Activity   Alcohol use: No   Drug use: No   Sexual activity: Not on file  Other Topics Concern   Not on file  Social History Narrative   Not on file   Social Determinants of Health   Financial Resource Strain: Not on file  Food Insecurity: Not on file  Transportation Needs: Not on file  Physical Activity: Not on file  Stress: Not on file  Social Connections: Not on file  Intimate Partner Violence: Not on file   Family History  Problem Relation Age of Onset   Cancer Mother    Arthritis/Rheumatoid Sister    Diabetes Child        60   Colon polyps Neg Hx    Rectal cancer Neg Hx    Esophageal cancer Neg Hx    Inflammatory bowel disease Neg Hx    Liver disease Neg Hx    Pancreatic cancer Neg Hx    Stomach cancer Neg Hx    I have reviewed his medical, social, and family history in detail and updated the electronic medical record as necessary.    PHYSICAL EXAMINATION  BP (!) 158/70   Pulse 78   Ht 6\' 1"  (1.854 m)   Wt 227 lb (103 kg)   SpO2 98%   BMI 29.95  kg/m  Wt Readings from Last 3 Encounters:  07/09/22 227 lb (103 kg)  03/15/22 220 lb 3.2 oz (99.9 kg)  03/04/22 216 lb (98 kg)  GEN: NAD, appears stated age, doesn't appear chronically ill PSYCH: Cooperative, without pressured speech EYE: Conjunctivae pink, sclerae anicteric ENT: MMM CV: Nontachycardic RESP: No audible wheezing GI: NABS, soft, NT, without rebound MSK/EXT: No lower extremity edema SKIN: No jaundice NEURO:  Alert & Oriented x 3, no focal deficits   REVIEW OF DATA  I reviewed the following data at the time of this encounter:  GI Procedures and Studies  July 2023 colonoscopy - Six 3 to 6 mm polyps in the sigmoid colon and in the ascending colon, removed with a cold snare. Resected and retrieved. - Diverticulosis in the left colon. - External and internal hemorrhoids. - The examination was otherwise normal on direct and retroflexion views.  Pathology Diagnosis Surgical [P], colon, ascending polyp x 3, sigmoid x 3, polyp (6) - TUBULAR ADENOMA(S) WITHOUT HIGH-GRADE DYSPLASIA OR MALIGNANCY - HYPERPLASTIC POLYP(S)  Laboratory Studies  Reviewed those in epic  Imaging Studies  No relevant studies to review   ASSESSMENT  Gary Lang is a 71 y.o. male with a pmh significant for CHF, diabetes, chronic renal insufficiency, RA, status post cholecystectomy, diverticulosis, colon polyps (TA's).  The patient is seen today for evaluation and management of:  1. History of dysphagia   2. Gastroesophageal reflux disease without esophagitis   3. Hx of adenomatous colonic polyps    The patient is hemodynamically and clinically stable.  I have reviewed his previous chart documentation in the setting of 2022 having significant dysphagia symptoms which have now all resolved.  There has been some concern for some potential submucosal/subepithelial lesion but on EUS imaging by Dr. Ardis Hughs this was not present.  As the patient is doing well and not having symptoms we will not plan  any further evaluation of that at this point in time.  He will be due for colon cancer screening in 2026.  All patient questions were answered to the best of my ability, and the patient agrees to the aforementioned plan of action with follow-up as indicated.   PLAN  May use PPI or Carafate therapy as needed Holding on repeat endoscopic ultrasound evaluation since he had a negative EUS by Dr. Ardis Hughs in 2022 Colon cancer screening/colon polyp surveillance in 2026 If issues develop between now and 2026 patient to alert [place] need to be seen in clinic (otherwise follow-up as needed)   No orders of the defined types were placed in this encounter.   New Prescriptions   No medications on file   Modified Medications   No medications on file    Planned Follow Up No follow-ups on file.   Total Time in Face-to-Face and in Coordination of Care for patient including independent/personal interpretation/review of prior testing, medical history, examination, medication adjustment, communicating results with the patient directly, and documentation within the EHR is 25 minutes.   Justice Britain, MD [facility] Office # [phone]

## 2022-07-13 ENCOUNTER — Encounter: Payer: Self-pay | Admitting: Gastroenterology

## 2022-07-13 DIAGNOSIS — Z86010 Personal history of colonic polyps: Secondary | ICD-10-CM | POA: Insufficient documentation

## 2022-07-13 DIAGNOSIS — Z87898 Personal history of other specified conditions: Secondary | ICD-10-CM | POA: Insufficient documentation

## 2022-08-07 DIAGNOSIS — E782 Mixed hyperlipidemia: Secondary | ICD-10-CM | POA: Diagnosis not present

## 2022-08-07 DIAGNOSIS — E1122 Type 2 diabetes mellitus with diabetic chronic kidney disease: Secondary | ICD-10-CM | POA: Diagnosis not present

## 2022-08-14 DIAGNOSIS — G4701 Insomnia due to medical condition: Secondary | ICD-10-CM | POA: Diagnosis not present

## 2022-08-14 DIAGNOSIS — I700 Atherosclerosis of aorta: Secondary | ICD-10-CM | POA: Diagnosis not present

## 2022-08-14 DIAGNOSIS — D638 Anemia in other chronic diseases classified elsewhere: Secondary | ICD-10-CM | POA: Diagnosis not present

## 2022-08-14 DIAGNOSIS — N184 Chronic kidney disease, stage 4 (severe): Secondary | ICD-10-CM | POA: Diagnosis not present

## 2022-08-14 DIAGNOSIS — R809 Proteinuria, unspecified: Secondary | ICD-10-CM | POA: Diagnosis not present

## 2022-08-14 DIAGNOSIS — E1122 Type 2 diabetes mellitus with diabetic chronic kidney disease: Secondary | ICD-10-CM | POA: Diagnosis not present

## 2022-08-14 DIAGNOSIS — I10 Essential (primary) hypertension: Secondary | ICD-10-CM | POA: Diagnosis not present

## 2022-08-14 DIAGNOSIS — E211 Secondary hyperparathyroidism, not elsewhere classified: Secondary | ICD-10-CM | POA: Diagnosis not present

## 2022-08-14 DIAGNOSIS — E782 Mixed hyperlipidemia: Secondary | ICD-10-CM | POA: Diagnosis not present

## 2022-08-14 DIAGNOSIS — M069 Rheumatoid arthritis, unspecified: Secondary | ICD-10-CM | POA: Diagnosis not present

## 2022-08-14 DIAGNOSIS — I480 Paroxysmal atrial fibrillation: Secondary | ICD-10-CM | POA: Diagnosis not present

## 2022-09-04 ENCOUNTER — Other Ambulatory Visit (HOSPITAL_COMMUNITY): Payer: Self-pay | Admitting: Nurse Practitioner

## 2022-09-04 ENCOUNTER — Ambulatory Visit (HOSPITAL_COMMUNITY)
Admission: RE | Admit: 2022-09-04 | Discharge: 2022-09-04 | Disposition: A | Discharge status: Home / Self Care | Payer: Medicare Other | Source: Ambulatory Visit | Attending: Nurse Practitioner | Admitting: Nurse Practitioner

## 2022-09-04 DIAGNOSIS — R0781 Pleurodynia: Secondary | ICD-10-CM | POA: Diagnosis not present

## 2022-09-20 DIAGNOSIS — N1832 Chronic kidney disease, stage 3b: Secondary | ICD-10-CM | POA: Diagnosis not present

## 2022-09-26 DIAGNOSIS — N2581 Secondary hyperparathyroidism of renal origin: Secondary | ICD-10-CM | POA: Diagnosis not present

## 2022-09-26 DIAGNOSIS — E872 Acidosis, unspecified: Secondary | ICD-10-CM | POA: Diagnosis not present

## 2022-09-26 DIAGNOSIS — N184 Chronic kidney disease, stage 4 (severe): Secondary | ICD-10-CM | POA: Diagnosis not present

## 2022-09-26 DIAGNOSIS — N051 Unspecified nephritic syndrome with focal and segmental glomerular lesions: Secondary | ICD-10-CM | POA: Diagnosis not present

## 2022-09-26 DIAGNOSIS — I129 Hypertensive chronic kidney disease with stage 1 through stage 4 chronic kidney disease, or unspecified chronic kidney disease: Secondary | ICD-10-CM | POA: Diagnosis not present

## 2022-09-26 DIAGNOSIS — D631 Anemia in chronic kidney disease: Secondary | ICD-10-CM | POA: Diagnosis not present

## 2022-09-26 DIAGNOSIS — E559 Vitamin D deficiency, unspecified: Secondary | ICD-10-CM | POA: Diagnosis not present

## 2022-09-26 DIAGNOSIS — I770 Arteriovenous fistula, acquired: Secondary | ICD-10-CM | POA: Diagnosis not present

## 2022-12-05 DIAGNOSIS — E1122 Type 2 diabetes mellitus with diabetic chronic kidney disease: Secondary | ICD-10-CM | POA: Diagnosis not present

## 2022-12-05 DIAGNOSIS — E782 Mixed hyperlipidemia: Secondary | ICD-10-CM | POA: Diagnosis not present

## 2022-12-13 DIAGNOSIS — Z0000 Encounter for general adult medical examination without abnormal findings: Secondary | ICD-10-CM | POA: Diagnosis not present

## 2022-12-16 DIAGNOSIS — R809 Proteinuria, unspecified: Secondary | ICD-10-CM | POA: Diagnosis not present

## 2022-12-16 DIAGNOSIS — E1122 Type 2 diabetes mellitus with diabetic chronic kidney disease: Secondary | ICD-10-CM | POA: Diagnosis not present

## 2022-12-16 DIAGNOSIS — E782 Mixed hyperlipidemia: Secondary | ICD-10-CM | POA: Diagnosis not present

## 2022-12-16 DIAGNOSIS — E211 Secondary hyperparathyroidism, not elsewhere classified: Secondary | ICD-10-CM | POA: Diagnosis not present

## 2022-12-16 DIAGNOSIS — D638 Anemia in other chronic diseases classified elsewhere: Secondary | ICD-10-CM | POA: Diagnosis not present

## 2022-12-16 DIAGNOSIS — M069 Rheumatoid arthritis, unspecified: Secondary | ICD-10-CM | POA: Diagnosis not present

## 2022-12-16 DIAGNOSIS — G4701 Insomnia due to medical condition: Secondary | ICD-10-CM | POA: Diagnosis not present

## 2022-12-16 DIAGNOSIS — I10 Essential (primary) hypertension: Secondary | ICD-10-CM | POA: Diagnosis not present

## 2022-12-16 DIAGNOSIS — I480 Paroxysmal atrial fibrillation: Secondary | ICD-10-CM | POA: Diagnosis not present

## 2022-12-16 DIAGNOSIS — I700 Atherosclerosis of aorta: Secondary | ICD-10-CM | POA: Diagnosis not present

## 2022-12-16 DIAGNOSIS — N184 Chronic kidney disease, stage 4 (severe): Secondary | ICD-10-CM | POA: Diagnosis not present

## 2023-02-24 DIAGNOSIS — R600 Localized edema: Secondary | ICD-10-CM | POA: Diagnosis not present

## 2023-03-28 DIAGNOSIS — I10 Essential (primary) hypertension: Secondary | ICD-10-CM | POA: Diagnosis not present

## 2023-03-28 DIAGNOSIS — Z713 Dietary counseling and surveillance: Secondary | ICD-10-CM | POA: Diagnosis not present

## 2023-03-28 DIAGNOSIS — Z79899 Other long term (current) drug therapy: Secondary | ICD-10-CM | POA: Diagnosis not present

## 2023-03-28 DIAGNOSIS — Z7182 Exercise counseling: Secondary | ICD-10-CM | POA: Diagnosis not present

## 2023-03-31 DIAGNOSIS — Z794 Long term (current) use of insulin: Secondary | ICD-10-CM | POA: Diagnosis not present

## 2023-03-31 DIAGNOSIS — E109 Type 1 diabetes mellitus without complications: Secondary | ICD-10-CM | POA: Diagnosis not present

## 2023-03-31 DIAGNOSIS — H2513 Age-related nuclear cataract, bilateral: Secondary | ICD-10-CM | POA: Diagnosis not present

## 2023-04-04 DIAGNOSIS — E872 Acidosis, unspecified: Secondary | ICD-10-CM | POA: Diagnosis not present

## 2023-04-04 DIAGNOSIS — I129 Hypertensive chronic kidney disease with stage 1 through stage 4 chronic kidney disease, or unspecified chronic kidney disease: Secondary | ICD-10-CM | POA: Diagnosis not present

## 2023-04-04 DIAGNOSIS — I770 Arteriovenous fistula, acquired: Secondary | ICD-10-CM | POA: Diagnosis not present

## 2023-04-04 DIAGNOSIS — N2581 Secondary hyperparathyroidism of renal origin: Secondary | ICD-10-CM | POA: Diagnosis not present

## 2023-04-04 DIAGNOSIS — N184 Chronic kidney disease, stage 4 (severe): Secondary | ICD-10-CM | POA: Diagnosis not present

## 2023-04-04 DIAGNOSIS — N189 Chronic kidney disease, unspecified: Secondary | ICD-10-CM | POA: Diagnosis not present

## 2023-04-04 DIAGNOSIS — D631 Anemia in chronic kidney disease: Secondary | ICD-10-CM | POA: Diagnosis not present

## 2023-04-14 DIAGNOSIS — E1122 Type 2 diabetes mellitus with diabetic chronic kidney disease: Secondary | ICD-10-CM | POA: Diagnosis not present

## 2023-04-14 DIAGNOSIS — E782 Mixed hyperlipidemia: Secondary | ICD-10-CM | POA: Diagnosis not present

## 2023-04-14 LAB — HEMOGLOBIN A1C: Hemoglobin A1C: 9.1

## 2023-05-02 DIAGNOSIS — I10 Essential (primary) hypertension: Secondary | ICD-10-CM | POA: Diagnosis not present

## 2023-05-02 DIAGNOSIS — R809 Proteinuria, unspecified: Secondary | ICD-10-CM | POA: Diagnosis not present

## 2023-05-02 DIAGNOSIS — I480 Paroxysmal atrial fibrillation: Secondary | ICD-10-CM | POA: Diagnosis not present

## 2023-05-02 DIAGNOSIS — E782 Mixed hyperlipidemia: Secondary | ICD-10-CM | POA: Diagnosis not present

## 2023-05-02 DIAGNOSIS — M069 Rheumatoid arthritis, unspecified: Secondary | ICD-10-CM | POA: Diagnosis not present

## 2023-05-02 DIAGNOSIS — E669 Obesity, unspecified: Secondary | ICD-10-CM | POA: Diagnosis not present

## 2023-05-02 DIAGNOSIS — D638 Anemia in other chronic diseases classified elsewhere: Secondary | ICD-10-CM | POA: Diagnosis not present

## 2023-05-02 DIAGNOSIS — I129 Hypertensive chronic kidney disease with stage 1 through stage 4 chronic kidney disease, or unspecified chronic kidney disease: Secondary | ICD-10-CM | POA: Diagnosis not present

## 2023-05-02 DIAGNOSIS — I700 Atherosclerosis of aorta: Secondary | ICD-10-CM | POA: Diagnosis not present

## 2023-05-02 DIAGNOSIS — N184 Chronic kidney disease, stage 4 (severe): Secondary | ICD-10-CM | POA: Diagnosis not present

## 2023-05-02 DIAGNOSIS — E1122 Type 2 diabetes mellitus with diabetic chronic kidney disease: Secondary | ICD-10-CM | POA: Diagnosis not present

## 2023-05-02 DIAGNOSIS — E211 Secondary hyperparathyroidism, not elsewhere classified: Secondary | ICD-10-CM | POA: Diagnosis not present

## 2023-05-14 DIAGNOSIS — Z79899 Other long term (current) drug therapy: Secondary | ICD-10-CM | POA: Diagnosis not present

## 2023-05-14 DIAGNOSIS — Z713 Dietary counseling and surveillance: Secondary | ICD-10-CM | POA: Diagnosis not present

## 2023-05-14 DIAGNOSIS — I10 Essential (primary) hypertension: Secondary | ICD-10-CM | POA: Diagnosis not present

## 2023-05-23 DIAGNOSIS — Z23 Encounter for immunization: Secondary | ICD-10-CM | POA: Diagnosis not present

## 2023-06-20 DIAGNOSIS — L299 Pruritus, unspecified: Secondary | ICD-10-CM | POA: Diagnosis not present

## 2023-09-02 DIAGNOSIS — E782 Mixed hyperlipidemia: Secondary | ICD-10-CM | POA: Diagnosis not present

## 2023-09-02 DIAGNOSIS — E1122 Type 2 diabetes mellitus with diabetic chronic kidney disease: Secondary | ICD-10-CM | POA: Diagnosis not present

## 2023-09-04 LAB — MICROALBUMIN / CREATININE URINE RATIO: Microalb Creat Ratio: 1430

## 2023-09-04 LAB — HEMOGLOBIN A1C: Hemoglobin A1C: 9.7

## 2023-09-04 LAB — LIPID PANEL
LDL Cholesterol: 52
Triglycerides: 102 (ref 40–160)

## 2023-09-04 LAB — BASIC METABOLIC PANEL WITH GFR
BUN: 23 — AB (ref 4–21)
Creatinine: 2.3 — AB (ref 0.6–1.3)

## 2023-09-04 LAB — COMPREHENSIVE METABOLIC PANEL WITH GFR: eGFR: 30

## 2023-09-10 DIAGNOSIS — M069 Rheumatoid arthritis, unspecified: Secondary | ICD-10-CM | POA: Diagnosis not present

## 2023-09-10 DIAGNOSIS — D638 Anemia in other chronic diseases classified elsewhere: Secondary | ICD-10-CM | POA: Diagnosis not present

## 2023-09-10 DIAGNOSIS — E1122 Type 2 diabetes mellitus with diabetic chronic kidney disease: Secondary | ICD-10-CM | POA: Diagnosis not present

## 2023-09-10 DIAGNOSIS — E782 Mixed hyperlipidemia: Secondary | ICD-10-CM | POA: Diagnosis not present

## 2023-09-10 DIAGNOSIS — I700 Atherosclerosis of aorta: Secondary | ICD-10-CM | POA: Diagnosis not present

## 2023-09-10 DIAGNOSIS — E669 Obesity, unspecified: Secondary | ICD-10-CM | POA: Diagnosis not present

## 2023-09-10 DIAGNOSIS — N184 Chronic kidney disease, stage 4 (severe): Secondary | ICD-10-CM | POA: Diagnosis not present

## 2023-09-10 DIAGNOSIS — I129 Hypertensive chronic kidney disease with stage 1 through stage 4 chronic kidney disease, or unspecified chronic kidney disease: Secondary | ICD-10-CM | POA: Diagnosis not present

## 2023-09-10 DIAGNOSIS — R809 Proteinuria, unspecified: Secondary | ICD-10-CM | POA: Diagnosis not present

## 2023-09-10 DIAGNOSIS — I480 Paroxysmal atrial fibrillation: Secondary | ICD-10-CM | POA: Diagnosis not present

## 2023-09-10 DIAGNOSIS — I10 Essential (primary) hypertension: Secondary | ICD-10-CM | POA: Diagnosis not present

## 2023-09-10 DIAGNOSIS — L299 Pruritus, unspecified: Secondary | ICD-10-CM | POA: Diagnosis not present

## 2023-10-17 DIAGNOSIS — D631 Anemia in chronic kidney disease: Secondary | ICD-10-CM | POA: Diagnosis not present

## 2023-10-17 DIAGNOSIS — N2581 Secondary hyperparathyroidism of renal origin: Secondary | ICD-10-CM | POA: Diagnosis not present

## 2023-10-17 DIAGNOSIS — E559 Vitamin D deficiency, unspecified: Secondary | ICD-10-CM | POA: Diagnosis not present

## 2023-10-17 DIAGNOSIS — N184 Chronic kidney disease, stage 4 (severe): Secondary | ICD-10-CM | POA: Diagnosis not present

## 2023-10-17 DIAGNOSIS — E872 Acidosis, unspecified: Secondary | ICD-10-CM | POA: Diagnosis not present

## 2023-10-17 DIAGNOSIS — I10 Essential (primary) hypertension: Secondary | ICD-10-CM | POA: Diagnosis not present

## 2023-10-17 DIAGNOSIS — N189 Chronic kidney disease, unspecified: Secondary | ICD-10-CM | POA: Diagnosis not present

## 2023-10-17 DIAGNOSIS — N051 Unspecified nephritic syndrome with focal and segmental glomerular lesions: Secondary | ICD-10-CM | POA: Diagnosis not present

## 2023-10-17 DIAGNOSIS — I770 Arteriovenous fistula, acquired: Secondary | ICD-10-CM | POA: Diagnosis not present

## 2023-11-26 DIAGNOSIS — J019 Acute sinusitis, unspecified: Secondary | ICD-10-CM | POA: Diagnosis not present

## 2023-11-26 DIAGNOSIS — J302 Other seasonal allergic rhinitis: Secondary | ICD-10-CM | POA: Diagnosis not present

## 2023-12-10 ENCOUNTER — Telehealth: Payer: Self-pay

## 2023-12-10 NOTE — Progress Notes (Signed)
     12/10/2023 Name: Gary L Mcilwain Sr. MRN: [id number] DOB: 1950-09-20   Gary L Donnelly Sr. is a 73 y.o. year old male who was referred for medication management by their primary care provider, Lupita Raider, DNP. They presented for a face to face visit today.   They were referred to the pharmacist by a quality report for assistance in managing diabetes    Subjective:  Care Team: Primary Care Provider: Lupita Raider, DNP ; Next Scheduled Visit: 01/09/24  Medication Access/Adherence  Current Pharmacy:  [facility] [address], [place] - [address] [place] [address] Phone: [phone] Fax: [phone]   Patient reports affordability concerns with their medications: No  Patient reports access/transportation concerns to their pharmacy: No  Patient reports adherence concerns with their medications:  No     Diabetes: A1C 9.7% on 09/02/23, up from 9.1% on 04/14/23 GFR 35 w/ MACR 1430 on 09/02/23 Goal <8%  Current glucose readings: 96 this morning, 165 yesterday morning, wide variation day to day Using One Touch Verio meter; testing fasting blood sugar once daily  Patient denies hypoglycemic s/sx including dizziness, shakiness, sweating. Patient denies hyperglycemic symptoms including polyuria, polydipsia, polyphagia, nocturia, neuropathy, blurred vision.  Was not able to adequately discuss diet today, will attempt to do this in more detail at next visit.  Current physical activity: Stays very active working in the yard  Assessment: Uncontrolled Lantus - 50 units daily Insulin Lispro - 12 units daily Trulicity 3 mg - once weekly  Plan: - Reviewed goal A1c, goal fasting, and goal 2 hour post prandial glucose - Recommend to continue checking glucose daily, attempted to apply CGM but his phone was not compatible with either Freestyle or Dexcom. He plans to buy a new phone in the next couple weeks. Recommended models that would be compatible with CGM  sensors. -Created Dexcom Clarity account for him and will revisit CGM when he gets his new phone. -Says he is motivated to take better care of himself, will discuss diet and exercise plan in greater detail at next visit.  Follow Up: Phone - 12/24/23 @ 11 am, will change to office if he has a new CGM compatible phone.   Fayette Pho, PharmD

## 2023-12-24 ENCOUNTER — Telehealth: Payer: Self-pay

## 2023-12-24 NOTE — Progress Notes (Unsigned)
     12/24/2023 Name: Gary L Magowan Sr. MRN: [id number] DOB: February 06, 1951   Diabetes: A1C 9.7% on 09/02/23, up from 9.1% on 04/14/23 GFR 35 w/ MACR 1430 on 09/02/23 Goal <8%  Current glucose readings: 96 this morning, 165 yesterday morning, wide variation day to day Using One Touch Verio meter; testing fasting blood sugar once daily  Patient denies hypoglycemic s/sx including dizziness, shakiness, sweating. Patient denies hyperglycemic symptoms including polyuria, polydipsia, polyphagia, nocturia, neuropathy, blurred vision.  Was not able to adequately discuss diet today, will attempt to do this in more detail at next visit.  Current physical activity: Stays very active working in the yard  Assessment: Uncontrolled Lantus  - 50 units daily Insulin  Lispro - 12 units daily Trulicity 3 mg - once weekly  Plan: - Reviewed goal A1c, goal fasting, and goal 2 hour post prandial glucose - Recommend to continue checking glucose daily, attempted to apply CGM but his phone was not compatible with either Freestyle or Dexcom. He plans to buy a new phone in the next couple weeks. Recommended models that would be compatible with CGM sensors. -Created Dexcom Clarity account for him and will revisit CGM when he gets his new phone. -Says he is motivated to take better care of himself, will discuss diet and exercise plan in greater detail at next visit.  Follow Up: Phone - 12/24/23 @ 11 am, will change to office if he has a new CGM compatible phone.   Flint Hummer, PharmD

## 2024-01-02 DIAGNOSIS — E1122 Type 2 diabetes mellitus with diabetic chronic kidney disease: Secondary | ICD-10-CM | POA: Diagnosis not present

## 2024-01-02 DIAGNOSIS — E782 Mixed hyperlipidemia: Secondary | ICD-10-CM | POA: Diagnosis not present

## 2024-01-02 LAB — LIPID PANEL
LDL Cholesterol: 62
Triglycerides: 87 (ref 40–160)

## 2024-01-02 LAB — BASIC METABOLIC PANEL WITH GFR
BUN: 37 — AB (ref 4–21)
Creatinine: 2.9 — AB (ref 0.6–1.3)

## 2024-01-02 LAB — HEMOGLOBIN A1C: Hemoglobin A1C: 8.3

## 2024-01-02 LAB — MICROALBUMIN / CREATININE URINE RATIO: Microalb Creat Ratio: 635

## 2024-01-02 LAB — COMPREHENSIVE METABOLIC PANEL WITH GFR: eGFR: 22

## 2024-01-04 LAB — LAB REPORT - SCANNED
A1c: 8.3
Albumin, Urine POC: 1102.6
Albumin/Creatinine Ratio, Urine, POC: 635
Calcium: 8.7
Creatinine, POC: 173.7 mg/dL
EGFR: 22

## 2024-01-16 DIAGNOSIS — E669 Obesity, unspecified: Secondary | ICD-10-CM | POA: Diagnosis not present

## 2024-01-16 DIAGNOSIS — I480 Paroxysmal atrial fibrillation: Secondary | ICD-10-CM | POA: Diagnosis not present

## 2024-01-16 DIAGNOSIS — E211 Secondary hyperparathyroidism, not elsewhere classified: Secondary | ICD-10-CM | POA: Diagnosis not present

## 2024-01-16 DIAGNOSIS — E782 Mixed hyperlipidemia: Secondary | ICD-10-CM | POA: Diagnosis not present

## 2024-01-16 DIAGNOSIS — I700 Atherosclerosis of aorta: Secondary | ICD-10-CM | POA: Diagnosis not present

## 2024-01-16 DIAGNOSIS — M069 Rheumatoid arthritis, unspecified: Secondary | ICD-10-CM | POA: Diagnosis not present

## 2024-01-16 DIAGNOSIS — I10 Essential (primary) hypertension: Secondary | ICD-10-CM | POA: Diagnosis not present

## 2024-01-16 DIAGNOSIS — E1122 Type 2 diabetes mellitus with diabetic chronic kidney disease: Secondary | ICD-10-CM | POA: Diagnosis not present

## 2024-01-16 DIAGNOSIS — N184 Chronic kidney disease, stage 4 (severe): Secondary | ICD-10-CM | POA: Diagnosis not present

## 2024-01-16 DIAGNOSIS — R809 Proteinuria, unspecified: Secondary | ICD-10-CM | POA: Diagnosis not present

## 2024-01-16 DIAGNOSIS — L299 Pruritus, unspecified: Secondary | ICD-10-CM | POA: Diagnosis not present

## 2024-01-16 DIAGNOSIS — D631 Anemia in chronic kidney disease: Secondary | ICD-10-CM | POA: Diagnosis not present

## 2024-01-29 NOTE — Patient Instructions (Signed)

## 2024-01-30 ENCOUNTER — Ambulatory Visit (INDEPENDENT_AMBULATORY_CARE_PROVIDER_SITE_OTHER): Payer: Self-pay | Admitting: Nurse Practitioner

## 2024-01-30 ENCOUNTER — Encounter: Payer: Self-pay | Admitting: Nurse Practitioner

## 2024-01-30 VITALS — BP 120/60 | HR 80 | Ht 73.5 in | Wt 232.4 lb

## 2024-01-30 DIAGNOSIS — N184 Chronic kidney disease, stage 4 (severe): Secondary | ICD-10-CM

## 2024-01-30 DIAGNOSIS — E1122 Type 2 diabetes mellitus with diabetic chronic kidney disease: Secondary | ICD-10-CM

## 2024-01-30 DIAGNOSIS — Z794 Long term (current) use of insulin: Secondary | ICD-10-CM

## 2024-01-30 DIAGNOSIS — Z7985 Long-term (current) use of injectable non-insulin antidiabetic drugs: Secondary | ICD-10-CM | POA: Diagnosis not present

## 2024-01-30 MED ORDER — INSULIN GLARGINE 100 UNIT/ML ~~LOC~~ SOLN: 30.0000 [IU] | Freq: Every day | SUBCUTANEOUS | 3 refills | Status: DC

## 2024-01-30 MED ORDER — PEN NEEDLES 31G X 6 MM MISC: 3 refills | Status: DC

## 2024-01-30 MED ORDER — TRULICITY 1.5 MG/0.5ML ~~LOC~~ SOAJ: 1.5000 mg | SUBCUTANEOUS | 1 refills | Status: DC

## 2024-01-30 MED ORDER — HUMALOG 100 UNIT/ML ~~LOC~~ SOCT: 5.0000 [IU] | Freq: Three times a day (TID) | SUBCUTANEOUS | 3 refills | Status: DC

## 2024-01-30 NOTE — Progress Notes (Signed)
 Endocrinology Consult Note       01/30/2024, 11:38 AM   Subjective:    Patient ID: Gary Banco Sr., male    DOB: 1950-10-24.  Gary L Signer Sr. is being seen in consultation for management of currently uncontrolled symptomatic diabetes requested by  Omie Bickers, MD.   Past Medical History:  Diagnosis Date   CHF (congestive heart failure) (HCC)    Chronic cholecystitis    Chronic kidney disease    Diabetes mellitus without complication (HCC)    Type II   Hypertension    Rheumatoid arthritis (HCC)    Vitamin D  deficiency    Hx: of    Past Surgical History:  Procedure Laterality Date   AV FISTULA PLACEMENT Left 06/28/2013   Procedure: ARTERIOVENOUS (AV) FISTULA CREATION- Left arm with ultrasound guidance;  Surgeon: Mayo Speck, MD;  Location: [facility] OR;  Service: Vascular;  Laterality: Left;   BIOPSY  06/28/2021   Procedure: BIOPSY;  Surgeon: Janel Medford, MD;  Location: [facility];  Service: Endoscopy;;   CHOLECYSTECTOMY N/A 10/28/2017   Procedure: LAPAROSCOPIC CHOLECYSTECTOMY WITH INTRAOPERATIVE CHOLANGIOGRAM;  Surgeon: Dareen Ebbing, MD;  Location: [facility] OR;  Service: General;  Laterality: N/A;   COLONOSCOPY     hx: of   ESOPHAGOGASTRODUODENOSCOPY (EGD) WITH PROPOFOL  N/A 06/28/2021   Procedure: ESOPHAGOGASTRODUODENOSCOPY (EGD) WITH PROPOFOL ;  Surgeon: Janel Medford, MD;  Location: [facility];  Service: Endoscopy;  Laterality: N/A;   EUS N/A 06/28/2021   Procedure: UPPER ENDOSCOPIC ULTRASOUND (EUS) LINEAR;  Surgeon: Janel Medford, MD;  Location: [facility];  Service: Endoscopy;  Laterality: N/A;   FINE NEEDLE ASPIRATION N/A 06/28/2021   Procedure: FINE NEEDLE ASPIRATION (FNA) LINEAR;  Surgeon: Janel Medford, MD;  Location: [facility];  Service: Endoscopy;  Laterality: N/A;   HERNIA REPAIR     INSERTION OF DIALYSIS CATHETER N/A 06/28/2013   Procedure: INSERTION OF DIALYSIS CATHETER;   Surgeon: Mayo Speck, MD;  Location: [facility] OR;  Service: Vascular;  Laterality: N/A;   IR GENERIC HISTORICAL  09/08/2016   IR PERC CHOLECYSTOSTOMY 09/08/2016 Lucinda Saber, MD [facility]   IR GENERIC HISTORICAL  10/15/2016   IR RADIOLOGIST EVAL & MGMT 10/15/2016 Terry Ficks, PA-C [facility]   IR GENERIC HISTORICAL  10/29/2016   IR RADIOLOGIST EVAL & MGMT 10/29/2016 Fernando Hoyer, MD [facility]   LIGATION OF COMPETING BRANCHES OF ARTERIOVENOUS FISTULA Left 10/12/2013   Procedure: LIGATION OF COMPETING BRANCHES OF ARTERIOVENOUS FISTULA;  Surgeon: Richrd Char, MD;  Location: [facility] OR;  Service: Vascular;  Laterality: Left;   neck surgery      Social History   Socioeconomic History   Marital status: Married    Spouse name: Not on file   Number of children: Not on file   Years of education: Not on file   Highest education level: Not on file  Occupational History   Not on file  Tobacco Use   Smoking status: Former    Current packs/day: 0.00    Average packs/day: 0.5 packs/day for 10.0 years (5.0 ttl pk-yrs)    Types: Cigarettes    Start date: 09/02/1966    Quit date:  09/02/1976    Years since quitting: 47.4   Smokeless tobacco: Never  Vaping Use   Vaping status: Never Used  Substance and Sexual Activity   Alcohol use: No   Drug use: No   Sexual activity: Not on file  Other Topics Concern   Not on file  Social History Narrative   Not on file   Social Drivers of Health   Financial Resource Strain: Not on file  Food Insecurity: Not on file  Transportation Needs: Not on file  Physical Activity: Not on file  Stress: Not on file  Social Connections: Not on file    Family History  Problem Relation Age of Onset   Cancer Mother    Arthritis/Rheumatoid Sister    Diabetes Child        55   Colon polyps Neg Hx    Rectal cancer Neg Hx    Esophageal cancer Neg Hx    Inflammatory bowel disease Neg Hx    Liver disease Neg Hx    Pancreatic cancer Neg Hx     Stomach cancer Neg Hx     Outpatient Encounter Medications as of 01/30/2024  Medication Sig   acetaminophen  (TYLENOL ) 500 MG tablet Take 500-1,000 mg by mouth daily as needed for moderate pain.   atorvastatin  (LIPITOR) 40 MG tablet Take 40 mg by mouth in the morning.   CINNAMON PO Take 1 tablet by mouth daily.   cloNIDine  (CATAPRES ) 0.2 MG tablet Take 0.2 mg by mouth 3 (three) times daily.   diltiazem  (CARDIZEM  CD) 180 MG 24 hr capsule Take 180 mg by mouth 2 (two) times daily.   ferrous sulfate  324 MG TBEC Take 324 mg by mouth daily with breakfast.   hydrOXYzine (ATARAX) 25 MG tablet Take 25 mg by mouth at bedtime as needed.   lisinopril (ZESTRIL) 40 MG tablet Take 40 mg by mouth daily.   LORazepam (ATIVAN) 0.5 MG tablet Take 0.5 mg by mouth daily as needed for sleep.   UNIFINE PENTIPS 31G X 5 MM MISC USE AS DIRECTED WITH INSULIN    [DISCONTINUED] insulin  glargine (LANTUS ) 100 UNIT/ML injection Inject 30 Units into the skin in the morning.   [DISCONTINUED] insulin  lispro (HUMALOG) 100 UNIT/ML cartridge Inject 10 Units into the skin 3 (three) times daily with meals.   [DISCONTINUED] Insulin  Pen Needle (PEN NEEDLES) 31G X 6 MM MISC Use 1 needle per injection as directed   [DISCONTINUED] TRULICITY 1.5 MG/0.5ML SOPN Inject 1.5 mg into the skin every Wednesday.   Cholecalciferol  (VITAMIN D3) 50 MCG (2000 UT) TABS Take 2,000 Units by mouth every other day. (Patient not taking: Reported on 01/30/2024)   insulin  glargine (LANTUS ) 100 UNIT/ML injection Inject 0.3 mLs (30 Units total) into the skin at bedtime.   insulin  lispro (HUMALOG) 100 UNIT/ML cartridge Inject 0.05-0.11 mLs (5-11 Units total) into the skin 3 (three) times daily with meals.   Insulin  Pen Needle (PEN NEEDLES) 31G X 6 MM MISC Use to inject insulin  4 times daily   pantoprazole  (PROTONIX ) 40 MG tablet Take 1 tablet (40 mg total) by mouth 2 (two) times daily. (Patient taking differently: Take 20 mg by mouth 2 (two) times daily.)    predniSONE  (DELTASONE ) 10 MG tablet Take 5 mg by mouth daily as needed (pain). (Patient not taking: Reported on 01/30/2024)   sucralfate  (CARAFATE ) 1 g tablet Take 1 tablet (1 g total) by mouth 4 (four) times daily -  with meals and at bedtime. (Patient not taking: Reported on 01/30/2024)   [  START ON 02/04/2024] TRULICITY 1.5 MG/0.5ML SOAJ Inject 1.5 mg into the skin every Wednesday.   No facility-administered encounter medications on file as of 01/30/2024.    ALLERGIES: Allergies  Allergen Reactions   Glyburide     Renal issues   Metformin And Related Other (See Comments)    Renal issues.   Methotrexate Derivatives Other (See Comments)    Renal issues   Orencia  [Abatacept ] Other (See Comments)    Renal issues    VACCINATION STATUS: Immunization History  Administered Date(s) Administered   Influenza,inj,Quad PF,6+ Mos 06/19/2013   Moderna Covid-19 Vaccine  Bivalent Booster 50yrs & up 11/08/2021   Moderna Sars-Cov-2 Peds vaccine 37yrs thru 9yrs 04/04/2021   Moderna Sars-Covid-2 Vaccination 10/07/2019, 11/04/2019   Pneumococcal Polysaccharide-23 06/19/2013    Diabetes He presents for his initial diabetic visit. He has type 2 diabetes mellitus. Onset time: diagnosed at approx age of 67. His disease course has been fluctuating. Hypoglycemia symptoms include nervousness/anxiousness, sweats and tremors. There are no hypoglycemic complications. Diabetic complications include impotence and nephropathy. Risk factors for coronary artery disease include diabetes mellitus, male sex, hypertension, obesity, family history and sedentary lifestyle. Current diabetic treatment includes intensive insulin  program (Trulicity). He is compliant with treatment most of the time. His weight is fluctuating minimally. He is following a generally unhealthy diet. When asked about meal planning, he reported none. He has not had a previous visit with a dietitian. He participates in exercise intermittently. (He presents  today for his consultation with his meter showing mostly at target fasting glycemic profile.  His most recent A1c on 5/2 was 8.3%, improving from last A1c of 9.7%.  He only monitors glucose once daily.  He drinks water, coffee with cream and sweetener, sun drop (mostly when he has low readings), OJ with breakfast occasionally and lemonade.  He eats 3 meals per day and snacks on fruit between.  He does not engage in routine physical exercise due to pain from RA.  He is UTD on eye exam, has seen podiatry in the distant past (but would like to get set up with someone soon).) An ACE inhibitor/angiotensin II receptor blocker is being taken. He does not see a podiatrist.Eye exam is current.     Review of systems  Constitutional: + Minimally fluctuating body weight, current Body mass index is 30.25 kg/m., no fatigue, no subjective hyperthermia, no subjective hypothermia Eyes: no blurry vision, no xerophthalmia ENT: no sore throat, no nodules palpated in throat, no dysphagia/odynophagia, no hoarseness Cardiovascular: no chest pain, no shortness of breath, no palpitations, + intermittent leg swelling Respiratory: no cough, no shortness of breath Gastrointestinal: no nausea/vomiting/diarrhea Musculoskeletal: + diffuse muscle/joint aches- hx RA Skin: no rashes, no hyperemia Neurological: no tremors, no numbness, no tingling, no dizziness Psychiatric: no depression, no anxiety  Objective:      BP 120/60 (BP Location: Right Arm, Patient Position: Sitting, Cuff Size: Large)   Pulse 80   Ht 6' 1.5" (1.867 m)   Wt 232 lb 6.4 oz (105.4 kg)   BMI 30.25 kg/m   Wt Readings from Last 3 Encounters:  01/30/24 232 lb 6.4 oz (105.4 kg)  07/09/22 227 lb (103 kg)  03/15/22 220 lb 3.2 oz (99.9 kg)     BP Readings from Last 3 Encounters:  01/30/24 120/60  07/09/22 (!) 158/70  03/15/22 (!) 142/58     Physical Exam- Limited  Constitutional:  Body mass index is 30.25 kg/m. , not in acute distress, normal  state of mind Eyes:  EOMI, no exophthalmos Neck: Supple Cardiovascular: RRR (Hx afib but well controlled on exam today), no murmurs, rubs, or gallops, no edema  Respiratory: Adequate breathing efforts, no crackles, rales, rhonchi, or wheezing Musculoskeletal: no gross deformities, strength intact in all four extremities, no gross restriction of joint movements Skin:  no rashes, no hyperemia Neurological: no tremor with outstretched hands   Diabetic Foot Exam - Simple   No data filed      CMP ( most recent) CMP     Component Value Date/Time   NA 140 09/25/2021 1017   K 4.2 09/25/2021 1017   CL 107 09/25/2021 1017   CO2 27 09/25/2021 1017   GLUCOSE 118 (H) 09/25/2021 1017   BUN 37 (A) 01/02/2024 0000   CREATININE 2.9 (A) 01/02/2024 0000   CREATININE 2.20 (H) 09/25/2021 1017   CALCIUM  8.8 (L) 09/25/2021 1017   PROT 6.8 08/06/2021 0120   ALBUMIN  3.5 08/06/2021 0120   AST 11 (L) 08/06/2021 0120   ALT 14 08/06/2021 0120   ALKPHOS 61 08/06/2021 0120   BILITOT 0.6 08/06/2021 0120   GFR 36.90 (L) 07/11/2021 1037   EGFR 22 01/02/2024 0000   GFRNONAA 31 (L) 09/25/2021 1017     Diabetic Labs (most recent): Lab Results  Component Value Date   HGBA1C 8.3 01/02/2024   HGBA1C 9.7 09/04/2023   HGBA1C 9.1 04/14/2023     Lipid Panel ( most recent) Lipid Panel     Component Value Date/Time   CHOL 325 (H) 06/19/2013 0538   TRIG 87 01/02/2024 0000   HDL 46 06/19/2013 0538   CHOLHDL 7.1 06/19/2013 0538   VLDL 29 06/19/2013 0538   LDLCALC 62 01/02/2024 0000      Lab Results  Component Value Date   TSH 2.370 06/25/2021   TSH 0.457 09/09/2016   FREET4 0.76 09/09/2016           Assessment & Plan:   1) Type 2 diabetes mellitus with stage 4 chronic kidney disease, with long-term current use of insulin  (HCC) (Primary)  He presents today for his consultation with his meter showing mostly at target fasting glycemic profile.  His most recent A1c on 5/2 was 8.3%, improving  from last A1c of 9.7%.  He only monitors glucose once daily.  He drinks water, coffee with cream and sweetener, sun drop (mostly when he has low readings), OJ with breakfast occasionally and lemonade.  He eats 3 meals per day and snacks on fruit between.  He does not engage in routine physical exercise due to pain from RA.  He is UTD on eye exam, has seen podiatry in the distant past (but would like to get set up with someone soon).  He did bring his meter today showing glucose ranging between 67-194.  - Gary L Canale Sr. has currently uncontrolled symptomatic type 2 DM since 73 years of age, with most recent A1c of 8.3 %.   -Recent labs reviewed.  - I had a long discussion with him about the progressive nature of diabetes and the pathology behind its complications. -his diabetes is complicated by CKD (Hx of transplant) and he remains at a high risk for more acute and chronic complications which include CAD, CVA, CKD, retinopathy, and neuropathy. These are all discussed in detail with him.  The following Lifestyle Medicine recommendations according to American College of Lifestyle Medicine [facility]) were discussed and offered to patient and he agrees to start the journey:  A. Whole Foods, Plant-based plate comprising of fruits  and vegetables, plant-based proteins, whole-grain carbohydrates was discussed in detail with the patient.   A list for source of those nutrients were also provided to the patient.  Patient will use only water or unsweetened tea for hydration. B.  The need to stay away from risky substances including alcohol, smoking; obtaining 7 to 9 hours of restorative sleep, at least 150 minutes of moderate intensity exercise weekly, the importance of healthy social connections,  and stress reduction techniques were discussed. C.  A full color page of  Calorie density of various food groups per pound showing examples of each food groups was provided to the patient.  - I have counseled him on  diet and weight management by adopting a carbohydrate restricted/protein rich diet. Patient is encouraged to switch to unprocessed or minimally processed complex starch and increased protein intake (animal or plant source), fruits, and vegetables. -  he is advised to stick to a routine mealtimes to eat 3 meals a day and avoid unnecessary snacks (to snack only to correct hypoglycemia).   - he acknowledges that there is a room for improvement in his food and drink choices. - Suggestion is made for him to avoid simple carbohydrates from his diet including Cakes, Sweet Desserts, Ice Cream, Soda (diet and regular), Sweet Tea, Candies, Chips, Cookies, Store Bought Juices, Alcohol in Excess of 1-2 drinks a day, Artificial Sweeteners, Coffee Creamer, and "Sugar-free" Products. This will help patient to have more stable blood glucose profile and potentially avoid unintended weight gain.  - I have approached him with the following individualized plan to manage his diabetes and patient agrees:   -He is advised to continue Lantus  30 units daily but change from morning administration to bedtime.  Will adjust his Humalog to 5-11 units TID with meals if glucose is above 90 and he is eating (Specific instructions on how to titrate insulin  dosage based on glucose readings given to patient in writing).  He demonstrated his ability to properly use the SSI chart with me today.  -he is encouraged to start/continue monitoring glucose 4 times daily, before meals and before bed, to log their readings on the clinic sheets provided, and bring them to review at follow up appointment in 4 months.  He could benefit from CGM device given treatment with MDI.  Will send in script for Dexcom to [facility].  - he is warned not to take insulin  without proper monitoring per orders. - Adjustment parameters are given to him for hypo and hyperglycemia in writing. - he is encouraged to call clinic for blood glucose levels less than 70 or above  300 mg /dl. - he is advised to continue Trulicity 1.5 mg SQ weekly, therapeutically suitable for patient .  - he is not a candidate for Metformin, SGLT2i due to concurrent renal insufficiency- hx of transplant.  - he will be considered for incretin therapy as appropriate next visit.  - Specific targets for  A1c; LDL, HDL, and Triglycerides were discussed with the patient.  2) Blood Pressure /Hypertension:  his blood pressure is controlled to target.   he is advised to continue his current medications as prescribed by his PCP/nephrology.  3) Lipids/Hyperlipidemia:    Review of his recent lipid panel from 01/02/24 showed controlled LDL at 62 .  he is advised to continue Atorvastatin  40 mg daily at bedtime.  Side effects and precautions discussed with him.  4)  Weight/Diet:  his Body mass index is 30.25 kg/m.  -  clearly complicating his diabetes  care.   he is a candidate for weight loss. I discussed with him the fact that loss of 5 - 10% of his  current body weight will have the most impact on his diabetes management.  Exercise, and detailed carbohydrates information provided  -  detailed on discharge instructions.  5) Chronic Care/Health Maintenance: -he is on ACEI/ARB and Statin medications and is encouraged to initiate and continue to follow up with Ophthalmology, Dentist, Podiatrist at least yearly or according to recommendations, and advised to stay away from smoking. I have recommended yearly flu vaccine and pneumonia vaccine at least every 5 years; moderate intensity exercise for up to 150 minutes weekly; and sleep for at least 7 hours a day.  - he is advised to maintain close follow up with Omie Bickers, MD for primary care needs, as well as his other providers for optimal and coordinated care.   - Time spent in this patient care: 60 min, which was spent in counseling him about his diabetes and the rest reviewing his blood glucose logs, discussing his hypoglycemia and hyperglycemia  episodes, reviewing his current and previous labs/studies (including abstraction from other facilities) and medications doses and developing a long term treatment plan based on the latest standards of care/guidelines; and documenting his care.    Please refer to Patient Instructions for Blood Glucose Monitoring and Insulin /Medications Dosing Guide" in media tab for additional information. Please also refer to "Patient Self Inventory" in the Media tab for reviewed elements of pertinent patient history.  Gary L Vandunk Sr. participated in the discussions, expressed understanding, and voiced agreement with the above plans.  All questions were answered to his satisfaction. he is encouraged to contact clinic should he have any questions or concerns prior to his return visit.     Follow up plan: - Return in about 4 months (around 06/01/2024) for Diabetes F/U with A1c in office, No previsit labs, Bring meter and logs.    Hulon Magic, [facility] [address] [place], [place] [address] Phone: [phone] Fax: [phone]  01/30/2024, 11:38 AM

## 2024-02-09 ENCOUNTER — Other Ambulatory Visit: Payer: Self-pay | Admitting: *Deleted

## 2024-02-09 DIAGNOSIS — E1122 Type 2 diabetes mellitus with diabetic chronic kidney disease: Secondary | ICD-10-CM

## 2024-02-09 DIAGNOSIS — Z7985 Long-term (current) use of injectable non-insulin antidiabetic drugs: Secondary | ICD-10-CM

## 2024-02-09 DIAGNOSIS — Z794 Long term (current) use of insulin: Secondary | ICD-10-CM

## 2024-02-09 MED ORDER — INSULIN LISPRO (1 UNIT DIAL) 100 UNIT/ML (KWIKPEN): 5.0000 [IU] | PEN_INJECTOR | Freq: Three times a day (TID) | SUBCUTANEOUS | 0 refills | Status: DC

## 2024-02-09 NOTE — Telephone Encounter (Signed)
 [facility] ,([place]) called. They said that they had received a prescription for the patient's Humalog , but it was for a cartilage. We resent a new prescription in for the patient.

## 2024-02-16 ENCOUNTER — Telehealth: Payer: Self-pay

## 2024-02-16 NOTE — Telephone Encounter (Signed)
 Up to date on meds, next review in August

## 2024-02-19 ENCOUNTER — Telehealth: Payer: Self-pay

## 2024-02-19 NOTE — Telephone Encounter (Signed)
 Tried to return pt's call, did not receive an answer and was unable to leave a voicemail.

## 2024-04-19 DIAGNOSIS — N051 Unspecified nephritic syndrome with focal and segmental glomerular lesions: Secondary | ICD-10-CM | POA: Diagnosis not present

## 2024-04-19 DIAGNOSIS — N189 Chronic kidney disease, unspecified: Secondary | ICD-10-CM | POA: Diagnosis not present

## 2024-04-19 DIAGNOSIS — D631 Anemia in chronic kidney disease: Secondary | ICD-10-CM | POA: Diagnosis not present

## 2024-04-19 DIAGNOSIS — I10 Essential (primary) hypertension: Secondary | ICD-10-CM | POA: Diagnosis not present

## 2024-04-19 DIAGNOSIS — N184 Chronic kidney disease, stage 4 (severe): Secondary | ICD-10-CM | POA: Diagnosis not present

## 2024-04-19 DIAGNOSIS — E559 Vitamin D deficiency, unspecified: Secondary | ICD-10-CM | POA: Diagnosis not present

## 2024-04-19 DIAGNOSIS — I770 Arteriovenous fistula, acquired: Secondary | ICD-10-CM | POA: Diagnosis not present

## 2024-04-19 DIAGNOSIS — E872 Acidosis, unspecified: Secondary | ICD-10-CM | POA: Diagnosis not present

## 2024-04-19 DIAGNOSIS — N2581 Secondary hyperparathyroidism of renal origin: Secondary | ICD-10-CM | POA: Diagnosis not present

## 2024-05-10 DIAGNOSIS — E782 Mixed hyperlipidemia: Secondary | ICD-10-CM | POA: Diagnosis not present

## 2024-05-10 DIAGNOSIS — E1122 Type 2 diabetes mellitus with diabetic chronic kidney disease: Secondary | ICD-10-CM | POA: Diagnosis not present

## 2024-05-10 LAB — HEMOGLOBIN A1C: Hemoglobin A1C: 8.7

## 2024-05-10 LAB — LIPID PANEL: LDL Cholesterol: 56

## 2024-05-14 DIAGNOSIS — I480 Paroxysmal atrial fibrillation: Secondary | ICD-10-CM | POA: Diagnosis not present

## 2024-05-14 DIAGNOSIS — M069 Rheumatoid arthritis, unspecified: Secondary | ICD-10-CM | POA: Diagnosis not present

## 2024-05-14 DIAGNOSIS — L299 Pruritus, unspecified: Secondary | ICD-10-CM | POA: Diagnosis not present

## 2024-05-14 DIAGNOSIS — E1122 Type 2 diabetes mellitus with diabetic chronic kidney disease: Secondary | ICD-10-CM | POA: Diagnosis not present

## 2024-05-14 DIAGNOSIS — I10 Essential (primary) hypertension: Secondary | ICD-10-CM | POA: Diagnosis not present

## 2024-05-14 DIAGNOSIS — I129 Hypertensive chronic kidney disease with stage 1 through stage 4 chronic kidney disease, or unspecified chronic kidney disease: Secondary | ICD-10-CM | POA: Diagnosis not present

## 2024-05-14 DIAGNOSIS — I700 Atherosclerosis of aorta: Secondary | ICD-10-CM | POA: Diagnosis not present

## 2024-05-14 DIAGNOSIS — N184 Chronic kidney disease, stage 4 (severe): Secondary | ICD-10-CM | POA: Diagnosis not present

## 2024-05-14 DIAGNOSIS — E782 Mixed hyperlipidemia: Secondary | ICD-10-CM | POA: Diagnosis not present

## 2024-05-14 DIAGNOSIS — D638 Anemia in other chronic diseases classified elsewhere: Secondary | ICD-10-CM | POA: Diagnosis not present

## 2024-05-14 DIAGNOSIS — R809 Proteinuria, unspecified: Secondary | ICD-10-CM | POA: Diagnosis not present

## 2024-05-14 DIAGNOSIS — E669 Obesity, unspecified: Secondary | ICD-10-CM | POA: Diagnosis not present

## 2024-05-20 DIAGNOSIS — Z79899 Other long term (current) drug therapy: Secondary | ICD-10-CM | POA: Diagnosis not present

## 2024-05-20 DIAGNOSIS — M069 Rheumatoid arthritis, unspecified: Secondary | ICD-10-CM | POA: Diagnosis not present

## 2024-06-02 ENCOUNTER — Ambulatory Visit: Admitting: Nurse Practitioner

## 2024-06-02 ENCOUNTER — Encounter: Payer: Self-pay | Admitting: Nurse Practitioner

## 2024-06-02 VITALS — BP 128/70 | HR 93 | Ht 73.5 in | Wt 228.0 lb

## 2024-06-02 DIAGNOSIS — Z7985 Long-term (current) use of injectable non-insulin antidiabetic drugs: Secondary | ICD-10-CM

## 2024-06-02 DIAGNOSIS — Z794 Long term (current) use of insulin: Secondary | ICD-10-CM

## 2024-06-02 DIAGNOSIS — E1122 Type 2 diabetes mellitus with diabetic chronic kidney disease: Secondary | ICD-10-CM | POA: Diagnosis not present

## 2024-06-02 DIAGNOSIS — N184 Chronic kidney disease, stage 4 (severe): Secondary | ICD-10-CM

## 2024-06-02 MED ORDER — HUMALOG 100 UNIT/ML ~~LOC~~ SOCT: 5.0000 [IU] | Freq: Three times a day (TID) | SUBCUTANEOUS | 3 refills | Status: AC

## 2024-06-02 MED ORDER — INSULIN GLARGINE 100 UNIT/ML ~~LOC~~ SOLN: 30.0000 [IU] | Freq: Every day | SUBCUTANEOUS | 3 refills | Status: AC

## 2024-06-02 MED ORDER — TRULICITY 1.5 MG/0.5ML ~~LOC~~ SOAJ: 1.5000 mg | SUBCUTANEOUS | 1 refills | Status: AC

## 2024-06-02 MED ORDER — PEN NEEDLES 31G X 6 MM MISC: 3 refills | Status: AC

## 2024-06-02 NOTE — Progress Notes (Signed)
 Endocrinology Follow Up Note       06/02/2024, 11:47 AM   Subjective:    Patient ID: Gary Lang All Sr., male    DOB: [birth year].  Gary L Bryant Sr. is being seen in follow up after being seen in consultation for management of currently uncontrolled symptomatic diabetes requested by  Shona Norleen PEDLAR, MD.   Past Medical History:  Diagnosis Date   CHF (congestive heart failure) (HCC)    Chronic cholecystitis    Chronic kidney disease    Diabetes mellitus without complication (HCC)    Type II   Hypertension    Rheumatoid arthritis (HCC)    Vitamin D  deficiency    Hx: of    Past Surgical History:  Procedure Laterality Date   AV FISTULA PLACEMENT Left 06/28/2013   Procedure: ARTERIOVENOUS (AV) FISTULA CREATION- Left arm with ultrasound guidance;  Surgeon: Krystal JULIANNA Doing, MD;  Location: [facility] OR;  Service: Vascular;  Laterality: Left;   BIOPSY  06/28/2021   Procedure: BIOPSY;  Surgeon: Teressa Toribio SQUIBB, MD;  Location: [facility];  Service: Endoscopy;;   CHOLECYSTECTOMY N/A 10/28/2017   Procedure: LAPAROSCOPIC CHOLECYSTECTOMY WITH INTRAOPERATIVE CHOLANGIOGRAM;  Surgeon: Belinda Cough, MD;  Location: [facility] OR;  Service: General;  Laterality: N/A;   COLONOSCOPY     hx: of   ESOPHAGOGASTRODUODENOSCOPY (EGD) WITH PROPOFOL  N/A 06/28/2021   Procedure: ESOPHAGOGASTRODUODENOSCOPY (EGD) WITH PROPOFOL ;  Surgeon: Teressa Toribio SQUIBB, MD;  Location: [facility];  Service: Endoscopy;  Laterality: N/A;   EUS N/A 06/28/2021   Procedure: UPPER ENDOSCOPIC ULTRASOUND (EUS) LINEAR;  Surgeon: Teressa Toribio SQUIBB, MD;  Location: [facility];  Service: Endoscopy;  Laterality: N/A;   FINE NEEDLE ASPIRATION N/A 06/28/2021   Procedure: FINE NEEDLE ASPIRATION (FNA) LINEAR;  Surgeon: Teressa Toribio SQUIBB, MD;  Location: [facility];  Service: Endoscopy;  Laterality: N/A;   HERNIA REPAIR     INSERTION OF DIALYSIS CATHETER N/A 06/28/2013   Procedure:  INSERTION OF DIALYSIS CATHETER;  Surgeon: Krystal JULIANNA Doing, MD;  Location: [facility] OR;  Service: Vascular;  Laterality: N/A;   IR GENERIC HISTORICAL  09/08/2016   IR PERC CHOLECYSTOSTOMY 09/08/2016 Ozell Specking, MD [facility]   IR GENERIC HISTORICAL  10/15/2016   IR RADIOLOGIST EVAL & MGMT 10/15/2016 Sari Jarold Lamp, PA-C [facility]   IR GENERIC HISTORICAL  10/29/2016   IR RADIOLOGIST EVAL & MGMT 10/29/2016 Wilkie Lent, MD [facility]   LIGATION OF COMPETING BRANCHES OF ARTERIOVENOUS FISTULA Left 10/12/2013   Procedure: LIGATION OF COMPETING BRANCHES OF ARTERIOVENOUS FISTULA;  Surgeon: Carlin FORBES Haddock, MD;  Location: [facility] OR;  Service: Vascular;  Laterality: Left;   neck surgery      Social History   Socioeconomic History   Marital status: Married    Spouse name: Not on file   Number of children: Not on file   Years of education: Not on file   Highest education level: Not on file  Occupational History   Not on file  Tobacco Use   Smoking status: Former    Current packs/day: 0.00    Average packs/day: 0.5 packs/day for 10.0 years (5.0 ttl pk-yrs)    Types: Cigarettes    Start  date: 09/02/1966    Quit date: 09/02/1976    Years since quitting: 47.7   Smokeless tobacco: Never  Vaping Use   Vaping status: Never Used  Substance and Sexual Activity   Alcohol use: No   Drug use: No   Sexual activity: Not on file  Other Topics Concern   Not on file  Social History Narrative   Not on file   Social Drivers of Health   Financial Resource Strain: Not on file  Food Insecurity: Not on file  Transportation Needs: Not on file  Physical Activity: Not on file  Stress: Not on file  Social Connections: Not on file    Family History  Problem Relation Age of Onset   Cancer Mother    Arthritis/Rheumatoid Sister    Diabetes Child        24   Colon polyps Neg Hx    Rectal cancer Neg Hx    Esophageal cancer Neg Hx    Inflammatory bowel disease Neg Hx    Liver disease Neg Hx     Pancreatic cancer Neg Hx    Stomach cancer Neg Hx     Outpatient Encounter Medications as of 06/02/2024  Medication Sig   acetaminophen  (TYLENOL ) 500 MG tablet Take 500-1,000 mg by mouth daily as needed for moderate pain.   atorvastatin  (LIPITOR) 40 MG tablet Take 40 mg by mouth in the morning.   CINNAMON PO Take 1 tablet by mouth daily.   cloNIDine  (CATAPRES ) 0.2 MG tablet Take 0.2 mg by mouth 3 (three) times daily.   diltiazem  (CARDIZEM  CD) 180 MG 24 hr capsule Take 180 mg by mouth 2 (two) times daily.   ferrous sulfate  324 MG TBEC Take 324 mg by mouth daily with breakfast.   hydrOXYzine (ATARAX) 25 MG tablet Take 25 mg by mouth at bedtime as needed.   lisinopril (ZESTRIL) 40 MG tablet Take 40 mg by mouth daily.   LORazepam (ATIVAN) 0.5 MG tablet Take 0.5 mg by mouth daily as needed for sleep.   pantoprazole  (PROTONIX ) 40 MG tablet Take 1 tablet (40 mg total) by mouth 2 (two) times daily. (Patient taking differently: Take 20 mg by mouth 2 (two) times daily.)   UNIFINE PENTIPS 31G X 5 MM MISC USE AS DIRECTED WITH INSULIN    [DISCONTINUED] insulin  glargine (LANTUS ) 100 UNIT/ML injection Inject 0.3 mLs (30 Units total) into the skin at bedtime.   [DISCONTINUED] insulin  lispro (HUMALOG  KWIKPEN) 100 UNIT/ML KwikPen Inject 5-11 Units into the skin 3 (three) times daily.   [DISCONTINUED] insulin  lispro (HUMALOG ) 100 UNIT/ML cartridge Inject 0.05-0.11 mLs (5-11 Units total) into the skin 3 (three) times daily with meals.   [DISCONTINUED] Insulin  Pen Needle (PEN NEEDLES) 31G X 6 MM MISC Use to inject insulin  4 times daily   [DISCONTINUED] TRULICITY  1.5 MG/0.5ML SOAJ Inject 1.5 mg into the skin every Wednesday.   insulin  glargine (LANTUS ) 100 UNIT/ML injection Inject 0.3 mLs (30 Units total) into the skin at bedtime.   insulin  lispro (HUMALOG ) 100 UNIT/ML cartridge Inject 0.05-0.11 mLs (5-11 Units total) into the skin 3 (three) times daily with meals.   Insulin  Pen Needle (PEN NEEDLES) 31G X 6 MM MISC  Use to inject insulin  4 times daily   TRULICITY  1.5 MG/0.5ML SOAJ Inject 1.5 mg into the skin every Wednesday.   [DISCONTINUED] Cholecalciferol  (VITAMIN D3) 50 MCG (2000 UT) TABS Take 2,000 Units by mouth every other day. (Patient not taking: Reported on 06/02/2024)   [DISCONTINUED] predniSONE  (DELTASONE ) 10 MG tablet  Take 5 mg by mouth daily as needed (pain). (Patient not taking: Reported on 06/02/2024)   [DISCONTINUED] sucralfate  (CARAFATE ) 1 g tablet Take 1 tablet (1 g total) by mouth 4 (four) times daily -  with meals and at bedtime. (Patient not taking: Reported on 06/02/2024)   No facility-administered encounter medications on file as of 06/02/2024.    ALLERGIES: Allergies  Allergen Reactions   Glyburide     Renal issues   Metformin And Related Other (See Comments)    Renal issues.   Methotrexate Derivatives Other (See Comments)    Renal issues   Orencia  [Abatacept ] Other (See Comments)    Renal issues    VACCINATION STATUS: Immunization History  Administered Date(s) Administered   Influenza,inj,Quad PF,6+ Mos 06/19/2013   Moderna Covid-19 Vaccine  Bivalent Booster 57yrs & up 11/08/2021   Moderna Sars-Cov-2 Peds vaccine 2yrs thru 52yrs 04/04/2021   Moderna Sars-Covid-2 Vaccination 10/07/2019, 11/04/2019   Pneumococcal Polysaccharide-23 06/19/2013    Diabetes He presents for his follow-up diabetic visit. He has type 2 diabetes mellitus. Onset time: diagnosed at approx age of 56. His disease course has been improving. Hypoglycemia symptoms include nervousness/anxiousness, sweats and tremors. There are no diabetic associated symptoms. There are no hypoglycemic complications. Diabetic complications include impotence and nephropathy. Risk factors for coronary artery disease include diabetes mellitus, male sex, hypertension, obesity, family history and sedentary lifestyle. Current diabetic treatment includes intensive insulin  program (Trulicity ). He is compliant with treatment most of the  time. His weight is decreasing steadily. He is following a generally healthy diet. When asked about meal planning, he reported none. He has not had a previous visit with a dietitian. He participates in exercise intermittently. His home blood glucose trend is decreasing steadily. His overall blood glucose range is 180-200 mg/dl. (He presents today, accompanied by his son, with his logs showing fluctuating but overall improving glycemic profile.  His most recent A1c, checked on 9/8 by his PCP was 8.7%, increasing slightly from last visit of 8.3%.  He notes he has resumed his Prednisone  every morning for pain which is likely contributing to this.  He does have mild random hypoglycemia and hyperglycemia, but no real patterns to them.  He is asking for a referral to podiatry for assistance in trimming nails.) An ACE inhibitor/angiotensin II receptor blocker is being taken. He does not see a podiatrist.Eye exam is current.     Review of systems  Constitutional: + Minimally fluctuating body weight, current Body mass index is 29.67 kg/m., no fatigue, no subjective hyperthermia, no subjective hypothermia Eyes: no blurry vision, no xerophthalmia ENT: no sore throat, no nodules palpated in throat, no dysphagia/odynophagia, no hoarseness Cardiovascular: no chest pain, no shortness of breath, no palpitations, + intermittent leg swelling Respiratory: no cough, no shortness of breath Gastrointestinal: no nausea/vomiting/diarrhea Musculoskeletal: + diffuse muscle/joint aches- hx RA, right foot pain- worse at night Skin: no rashes, no hyperemia Neurological: no tremors, no numbness, no tingling, no dizziness Psychiatric: no depression, no anxiety  Objective:      BP 128/70 (BP Location: Right Arm, Patient Position: Sitting, Cuff Size: Large)   Pulse 93   Ht 6' 1.5 (1.867 m)   Wt 228 lb (103.4 kg)   BMI 29.67 kg/m   Wt Readings from Last 3 Encounters:  06/02/24 228 lb (103.4 kg)  01/30/24 232 lb 6.4 oz  (105.4 kg)  07/09/22 227 lb (103 kg)     BP Readings from Last 3 Encounters:  06/02/24 128/70  01/30/24 120/60  07/09/22 ROLLEN)  158/70     Physical Exam- Limited  Constitutional:  Body mass index is 29.67 kg/m. , not in acute distress, normal state of mind, nonpitting edema to BLE R>L Eyes:  EOMI, no exophthalmos Musculoskeletal: no gross deformities, strength intact in all four extremities, no gross restriction of joint movements Skin:  no rashes, no hyperemia Neurological: no tremor with outstretched hands   Diabetic Foot Exam - Simple   Simple Foot Form Diabetic Foot exam was performed with the following findings: Yes 06/02/2024 11:15 AM  Visual Inspection No deformities, no ulcerations, no other skin breakdown bilaterally: Yes Sensation Testing Intact to touch and monofilament testing bilaterally: Yes Pulse Check Posterior Tibialis and Dorsalis pulse intact bilaterally: Yes Comments      CMP ( most recent) CMP     Component Value Date/Time   NA 140 09/25/2021 1017   K 4.2 09/25/2021 1017   CL 107 09/25/2021 1017   CO2 27 09/25/2021 1017   GLUCOSE 118 (H) 09/25/2021 1017   BUN 37 (A) 01/02/2024 0000   CREATININE 2.9 (A) 01/02/2024 0000   CREATININE 2.20 (H) 09/25/2021 1017   CALCIUM  8.8 (L) 09/25/2021 1017   PROT 6.8 08/06/2021 0120   ALBUMIN  3.5 08/06/2021 0120   AST 11 (L) 08/06/2021 0120   ALT 14 08/06/2021 0120   ALKPHOS 61 08/06/2021 0120   BILITOT 0.6 08/06/2021 0120   GFR 36.90 (L) 07/11/2021 1037   EGFR 22.0 01/04/2024 0000   EGFR 22 01/02/2024 0000   GFRNONAA 31 (L) 09/25/2021 1017     Diabetic Labs (most recent): Lab Results  Component Value Date   HGBA1C 8.7 05/10/2024   HGBA1C 8.3 01/02/2024   HGBA1C 9.7 09/04/2023     Lipid Panel ( most recent) Lipid Panel     Component Value Date/Time   CHOL 325 (H) 06/19/2013 0538   TRIG 87 01/02/2024 0000   HDL 46 06/19/2013 0538   CHOLHDL 7.1 06/19/2013 0538   VLDL 29 06/19/2013 0538    LDLCALC 56 05/10/2024 0000      Lab Results  Component Value Date   TSH 2.370 06/25/2021   TSH 0.457 09/09/2016   FREET4 0.76 09/09/2016           Assessment & Plan:   1) Type 2 diabetes mellitus with stage 4 chronic kidney disease, with long-term current use of insulin  (HCC) (Primary)  He presents today, accompanied by his son, with his logs showing fluctuating but overall improving glycemic profile.  His most recent A1c, checked on 9/8 by his PCP was 8.7%, increasing slightly from last visit of 8.3%.  He notes he has resumed his Prednisone  every morning for pain which is likely contributing to this.  He does have mild random hypoglycemia and hyperglycemia, but no real patterns to them.  He is asking for a referral to podiatry for assistance in trimming nails.  - Locklan L Hasty Sr. has currently uncontrolled symptomatic type 2 DM since 72 years of age.   -Recent labs reviewed.  - I had a long discussion with him about the progressive nature of diabetes and the pathology behind its complications. -his diabetes is complicated by CKD (Hx of transplant) and he remains at a high risk for more acute and chronic complications which include CAD, CVA, CKD, retinopathy, and neuropathy. These are all discussed in detail with him.  The following Lifestyle Medicine recommendations according to American College of Lifestyle Medicine [facility]) were discussed and offered to patient and he agrees to start the  journey:  A. Whole Foods, Plant-based plate comprising of fruits and vegetables, plant-based proteins, whole-grain carbohydrates was discussed in detail with the patient.   A list for source of those nutrients were also provided to the patient.  Patient will use only water or unsweetened tea for hydration. B.  The need to stay away from risky substances including alcohol, smoking; obtaining 7 to 9 hours of restorative sleep, at least 150 minutes of moderate intensity exercise weekly, the importance  of healthy social connections,  and stress reduction techniques were discussed. C.  A full color page of  Calorie density of various food groups per pound showing examples of each food groups was provided to the patient.  - Nutritional counseling repeated at each appointment due to patients tendency to fall back in to old habits.  - The patient admits there is a room for improvement in their diet and drink choices. -  Suggestion is made for the patient to avoid simple carbohydrates from their diet including Cakes, Sweet Desserts / Pastries, Ice Cream, Soda (diet and regular), Sweet Tea, Candies, Chips, Cookies, Sweet Pastries, Store Bought Juices, Alcohol in Excess of 1-2 drinks a day, Artificial Sweeteners, Coffee Creamer, and Sugar-free Products. This will help patient to have stable blood glucose profile and potentially avoid unintended weight gain.   - I encouraged the patient to switch to unprocessed or minimally processed complex starch and increased protein intake (animal or plant source), fruits, and vegetables.   - Patient is advised to stick to a routine mealtimes to eat 3 meals a day and avoid unnecessary snacks (to snack only to correct hypoglycemia).  - I have approached him with the following individualized plan to manage his diabetes and patient agrees:   -He is advised to continue Lantus  30 units nightly and Humalog  5-11 units TID with meals if glucose is above 90 and he is eating (Specific instructions on how to titrate insulin  dosage based on glucose readings given to patient in writing).  He can also continue Trulicity  1.5 mg SQ weekly.  -he is encouraged to start/continue monitoring glucose 4 times daily, before meals and before bed, to log their readings on the clinic sheets provided, and bring them to review at follow up appointment in 4 months.  He could benefit from CGM device given treatment with MDI.  Rx for Dexcom G7 was sent to [facility] but he never got it.  Will reach  out to them to inquire.  - he is warned not to take insulin  without proper monitoring per orders. - Adjustment parameters are given to him for hypo and hyperglycemia in writing. - he is encouraged to call clinic for blood glucose levels less than 70 or above 300 mg /dl.  - he is not a candidate for Metformin, SGLT2i due to concurrent renal insufficiency- hx of transplant.  - Specific targets for  A1c; LDL, HDL, and Triglycerides were discussed with the patient.  2) Blood Pressure /Hypertension:  his blood pressure is controlled to target.   he is advised to continue his current medications as prescribed by his PCP/nephrology.  3) Lipids/Hyperlipidemia:    Review of his recent lipid panel from 05/10/24 showed controlled LDL at 56 .  he is advised to continue Atorvastatin  40 mg daily at bedtime.  Side effects and precautions discussed with him.  4)  Weight/Diet:  his Body mass index is 29.67 kg/m.  -  clearly complicating his diabetes care.   he is a candidate for weight loss. I  discussed with him the fact that loss of 5 - 10% of his  current body weight will have the most impact on his diabetes management.  Exercise, and detailed carbohydrates information provided  -  detailed on discharge instructions.  5) Chronic Care/Health Maintenance: -he is on ACEI/ARB and Statin medications and is encouraged to initiate and continue to follow up with Ophthalmology, Dentist, Podiatrist at least yearly or according to recommendations, and advised to stay away from smoking. I have recommended yearly flu vaccine and pneumonia vaccine at least every 5 years; moderate intensity exercise for up to 150 minutes weekly; and sleep for at least 7 hours a day.  - he is advised to maintain close follow up with Shona Norleen PEDLAR, MD for primary care needs, as well as his other providers for optimal and coordinated care.     I spent  43  minutes in the care of the patient today including review of labs from CMP, Lipids,  Thyroid  Function, Hematology (current and previous including abstractions from other facilities); face-to-face time discussing  his blood glucose readings/logs, discussing hypoglycemia and hyperglycemia episodes and symptoms, medications doses, his options of short and long term treatment based on the latest standards of care / guidelines;  discussion about incorporating lifestyle medicine;  and documenting the encounter. Risk reduction counseling performed per USPSTF guidelines to reduce obesity and cardiovascular risk factors.     Please refer to Patient Instructions for Blood Glucose Monitoring and Insulin /Medications Dosing Guide  in media tab for additional information. Please  also refer to  Patient Self Inventory in the Media  tab for reviewed elements of pertinent patient history.  Renner L Dreyfuss Sr. participated in the discussions, expressed understanding, and voiced agreement with the above plans.  All questions were answered to his satisfaction. he is encouraged to contact clinic should he have any questions or concerns prior to his return visit.     Follow up plan: - Return in about 4 months (around 10/03/2024) for Diabetes F/U with A1c in office, Bring meter and logs, No previsit labs.    Benton Rio, [facility] [address] [place], [place] [address] Phone: [phone] Fax: [phone]  06/02/2024, 11:47 AM

## 2024-07-19 DIAGNOSIS — Z23 Encounter for immunization: Secondary | ICD-10-CM | POA: Diagnosis not present

## 2024-07-20 DIAGNOSIS — M109 Gout, unspecified: Secondary | ICD-10-CM | POA: Diagnosis not present

## 2024-07-20 DIAGNOSIS — L299 Pruritus, unspecified: Secondary | ICD-10-CM | POA: Diagnosis not present

## 2024-07-20 DIAGNOSIS — B356 Tinea cruris: Secondary | ICD-10-CM | POA: Diagnosis not present

## 2024-07-20 DIAGNOSIS — E1122 Type 2 diabetes mellitus with diabetic chronic kidney disease: Secondary | ICD-10-CM | POA: Diagnosis not present

## 2024-07-20 DIAGNOSIS — M069 Rheumatoid arthritis, unspecified: Secondary | ICD-10-CM | POA: Diagnosis not present

## 2024-07-20 DIAGNOSIS — L309 Dermatitis, unspecified: Secondary | ICD-10-CM | POA: Diagnosis not present

## 2024-07-20 DIAGNOSIS — N189 Chronic kidney disease, unspecified: Secondary | ICD-10-CM | POA: Diagnosis not present

## 2024-09-09 LAB — LAB REPORT - SCANNED
A1c: 8.2
Albumin, Urine POC: 595.7
Creatinine, POC: 153.7 mg/dL
EGFR: 26
Microalb Creat Ratio: 388

## 2024-09-14 NOTE — Progress Notes (Signed)
 Gary Lang.                                          MRN: [id number]   09/14/2024   The [facility] Quality Team Specialist reviewed this patient medical record for the purposes of chart review for care gap closure. The following were reviewed: abstraction for care gap closure-controlling blood pressure.    [facility] Quality Team

## 2024-10-06 ENCOUNTER — Ambulatory Visit: Admitting: Nurse Practitioner

## 2024-10-06 DIAGNOSIS — Z7985 Long-term (current) use of injectable non-insulin antidiabetic drugs: Secondary | ICD-10-CM

## 2024-10-06 DIAGNOSIS — E1122 Type 2 diabetes mellitus with diabetic chronic kidney disease: Secondary | ICD-10-CM

## 2024-10-06 DIAGNOSIS — Z794 Long term (current) use of insulin: Secondary | ICD-10-CM
